# Patient Record
Sex: Male | Born: 1955 | Race: White | Hispanic: No | Smoking: Never smoker
Health system: Southern US, Community
[De-identification: ages and names within clinical notes are randomized; demographics above are authoritative.]

## PROBLEM LIST (undated history)

## (undated) DIAGNOSIS — I219 Acute myocardial infarction, unspecified: Secondary | ICD-10-CM

## (undated) DIAGNOSIS — I350 Nonrheumatic aortic (valve) stenosis: Secondary | ICD-10-CM

## (undated) DIAGNOSIS — M1990 Unspecified osteoarthritis, unspecified site: Secondary | ICD-10-CM

## (undated) DIAGNOSIS — Z87442 Personal history of urinary calculi: Secondary | ICD-10-CM

## (undated) DIAGNOSIS — N200 Calculus of kidney: Secondary | ICD-10-CM

## (undated) DIAGNOSIS — J189 Pneumonia, unspecified organism: Secondary | ICD-10-CM

## (undated) DIAGNOSIS — I10 Essential (primary) hypertension: Secondary | ICD-10-CM

## (undated) DIAGNOSIS — G629 Polyneuropathy, unspecified: Secondary | ICD-10-CM

## (undated) HISTORY — PX: OTHER SURGICAL HISTORY: SHX169

## (undated) HISTORY — PX: REPLACEMENT TOTAL KNEE: SUR1224

## (undated) HISTORY — PX: APPENDECTOMY: SHX54

## (undated) HISTORY — PX: LITHOTRIPSY: SUR834

---

## 1998-03-05 ENCOUNTER — Other Ambulatory Visit: Admission: RE | Admit: 1998-03-05 | Discharge: 1998-03-05 | Payer: Self-pay | Admitting: Family Medicine

## 1998-04-08 ENCOUNTER — Emergency Department (HOSPITAL_COMMUNITY): Admission: EM | Admit: 1998-04-08 | Discharge: 1998-04-08 | Payer: Self-pay

## 1998-06-28 ENCOUNTER — Emergency Department (HOSPITAL_COMMUNITY): Admission: EM | Admit: 1998-06-28 | Discharge: 1998-06-28 | Payer: Self-pay | Admitting: Emergency Medicine

## 1998-06-29 ENCOUNTER — Encounter: Payer: Self-pay | Admitting: Emergency Medicine

## 1998-06-29 ENCOUNTER — Ambulatory Visit (HOSPITAL_COMMUNITY): Admission: RE | Admit: 1998-06-29 | Discharge: 1998-06-29 | Payer: Self-pay | Admitting: Emergency Medicine

## 1998-09-04 ENCOUNTER — Emergency Department (HOSPITAL_COMMUNITY): Admission: EM | Admit: 1998-09-04 | Discharge: 1998-09-04 | Payer: Self-pay | Admitting: Emergency Medicine

## 1998-09-04 ENCOUNTER — Encounter: Payer: Self-pay | Admitting: Emergency Medicine

## 1998-12-02 ENCOUNTER — Emergency Department (HOSPITAL_COMMUNITY): Admission: EM | Admit: 1998-12-02 | Discharge: 1998-12-02 | Payer: Self-pay | Admitting: Emergency Medicine

## 1998-12-03 ENCOUNTER — Encounter: Payer: Self-pay | Admitting: Emergency Medicine

## 1998-12-03 ENCOUNTER — Emergency Department (HOSPITAL_COMMUNITY): Admission: EM | Admit: 1998-12-03 | Discharge: 1998-12-03 | Payer: Self-pay | Admitting: Emergency Medicine

## 1998-12-03 ENCOUNTER — Ambulatory Visit (HOSPITAL_COMMUNITY): Admission: RE | Admit: 1998-12-03 | Discharge: 1998-12-03 | Payer: Self-pay | Admitting: Emergency Medicine

## 1999-03-26 ENCOUNTER — Encounter: Payer: Self-pay | Admitting: Orthopaedic Surgery

## 1999-03-26 ENCOUNTER — Ambulatory Visit (HOSPITAL_COMMUNITY): Admission: RE | Admit: 1999-03-26 | Discharge: 1999-03-26 | Payer: Self-pay | Admitting: Orthopaedic Surgery

## 1999-04-20 ENCOUNTER — Ambulatory Visit (HOSPITAL_COMMUNITY): Admission: RE | Admit: 1999-04-20 | Discharge: 1999-04-20 | Payer: Self-pay | Admitting: Orthopaedic Surgery

## 1999-04-20 ENCOUNTER — Encounter: Payer: Self-pay | Admitting: Orthopaedic Surgery

## 1999-04-23 ENCOUNTER — Emergency Department (HOSPITAL_COMMUNITY): Admission: EM | Admit: 1999-04-23 | Discharge: 1999-04-23 | Payer: Self-pay | Admitting: Emergency Medicine

## 1999-05-11 ENCOUNTER — Encounter: Payer: Self-pay | Admitting: Orthopaedic Surgery

## 1999-05-11 ENCOUNTER — Ambulatory Visit (HOSPITAL_COMMUNITY): Admission: RE | Admit: 1999-05-11 | Discharge: 1999-05-11 | Payer: Self-pay | Admitting: Orthopaedic Surgery

## 2001-12-05 ENCOUNTER — Encounter: Payer: Self-pay | Admitting: Emergency Medicine

## 2001-12-05 ENCOUNTER — Emergency Department (HOSPITAL_COMMUNITY): Admission: EM | Admit: 2001-12-05 | Discharge: 2001-12-05 | Payer: Self-pay | Admitting: Emergency Medicine

## 2002-06-18 ENCOUNTER — Emergency Department (HOSPITAL_COMMUNITY): Admission: EM | Admit: 2002-06-18 | Discharge: 2002-06-19 | Payer: Self-pay | Admitting: Emergency Medicine

## 2002-06-19 ENCOUNTER — Encounter: Payer: Self-pay | Admitting: Emergency Medicine

## 2002-11-10 ENCOUNTER — Encounter: Payer: Self-pay | Admitting: Emergency Medicine

## 2002-11-10 ENCOUNTER — Emergency Department (HOSPITAL_COMMUNITY): Admission: EM | Admit: 2002-11-10 | Discharge: 2002-11-10 | Payer: Self-pay | Admitting: Emergency Medicine

## 2002-11-10 IMAGING — CT CT ABDOMEN W/O CM
1 series · 15 of 32 positions shown, 19 images · non-contrast
Comparison: none

FINDINGS
CLINICAL DATA: 46 YEAR OLD WITH LEFT-SIDED PAIN.  HEMATURIA.  HISTORY OF APPENDECTOMY.
CT ABDOMEN AND PELVIS WITHOUT CONTRAST
HELICAL IMAGES ARE PERFORMED THROUGH THE ABDOMEN AND PELVIS WITHOUT CONTRAST FOLLOWING STONE
PROTOCOL.
CT ABDOMEN:
THERE IS LEFT HYDRONEPHROSIS.  PERINEPHRIC STRANDING AND PERIURETERAL STRANDING IS SEEN ON THE
LEFT.  A 6 X 9 MM CALCULUS IS SEEN AT THE LEFT URETEROPELVIC JUNCTION.  JUST DISTAL TO THE STONE,
THERE IS A SMALL 3 MM STONE (IMAGE #35).  WITHIN THE LEFT RENAL PELVIS A SMALL 4 MM CALCULUS IS
SEEN AND THERE IS PROBABLE AN INTRARENAL CALCULUS IN THE MIDPOLE REGION ON IMAGE #23.  A 1 MM LOWER
POLE STONE IS ALSO SEEN.  NO FOCAL ABNORMALITY IS SEEN WITHIN THE VISUALIZED PORTIONS OF THE LIVER,
SPLEEN, PANCREAS, RIGHT KIDNEY, OR ADRENAL GLANDS.  THE GALLBLADDER IS PRESENT.   THERE IS NO
RETROPERITONEAL ADENOPATHY OR ASCITES.  THE APPENDIX IS ABSENT.
IMPRESSION
LEFT HYDRONEPHROSIS ON THE BASIS OF A 6 X 9 MM URETEROPELVIC JUNCTION STONE.  A 3 MM CALCULUS IS
ALSO SEEN DISTAL TO THIS URETERAL STONE.  THERE ARE INTRARENAL CALCULI ON THE LEFT AS WELL.
CT PELVIS:
THERE IS NO FREE PELVIC FLUID OR PELVIC ADENOPATHY.  THE BOWEL LOOPS HAVE A NORMAL APPEARANCE AS
DOES THE BLADDER.
NO EVIDENCE FOR ACUTE ABNORMALITY OF THE PELVIS.
I DISCUSSED THE FINDINGS WITH DR. KRITZINGER.

[Series 2: kidney sto 5.0 b30f · axial · 0.71mm/px · z∈[-164,+160]mm · 15 of 90 slices shown, 19 images]
[im 6/90  soft-tissue]
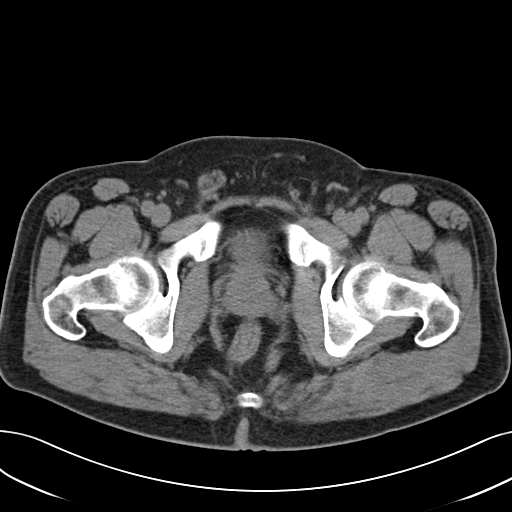
[im 6/90  bone]
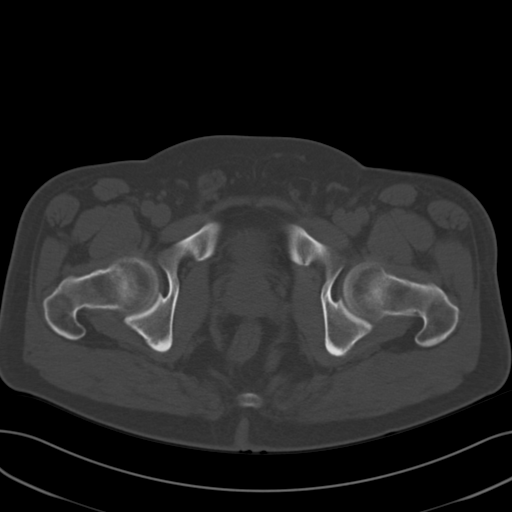
[im 12/90  soft-tissue]
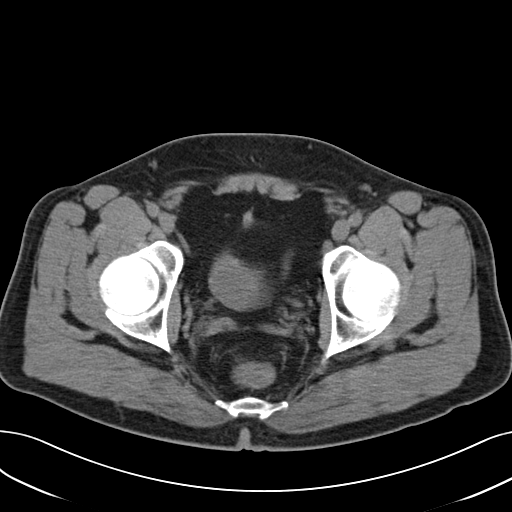
[im 18/90  soft-tissue]
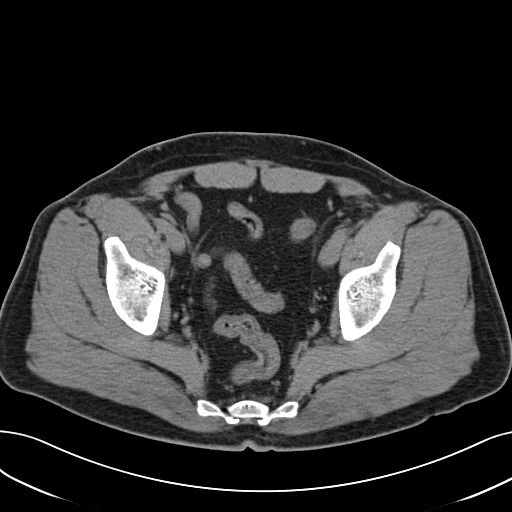
[im 26/90  soft-tissue]
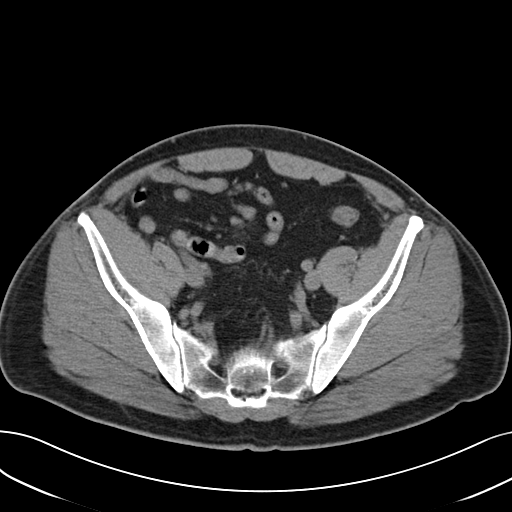
[im 32/90  soft-tissue]
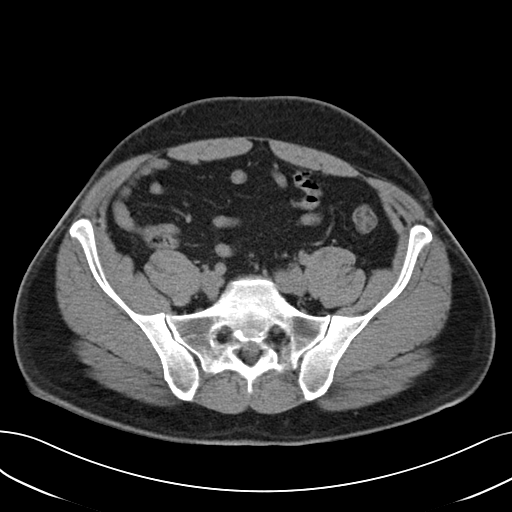
[im 38/90  soft-tissue]
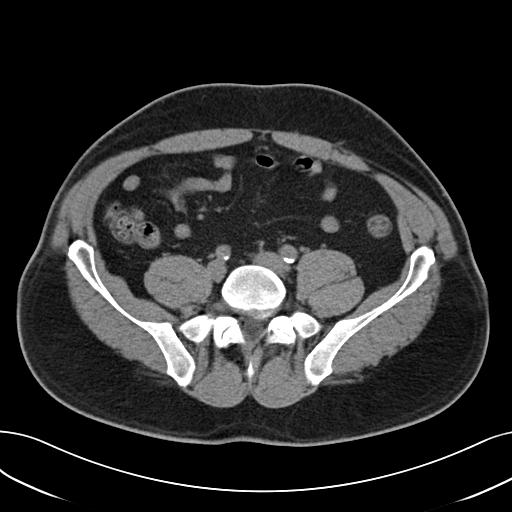
[im 46/90  soft-tissue]
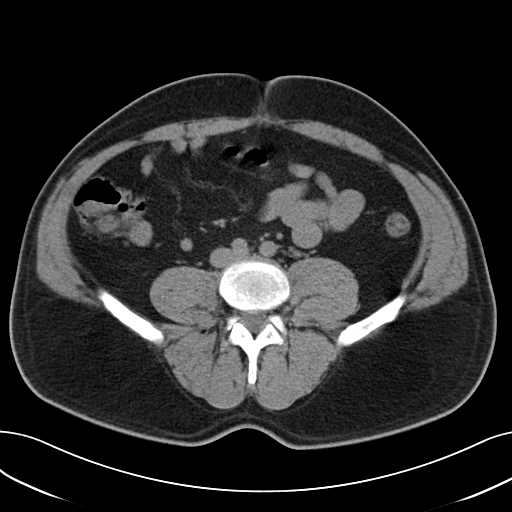
[im 52/90  soft-tissue]
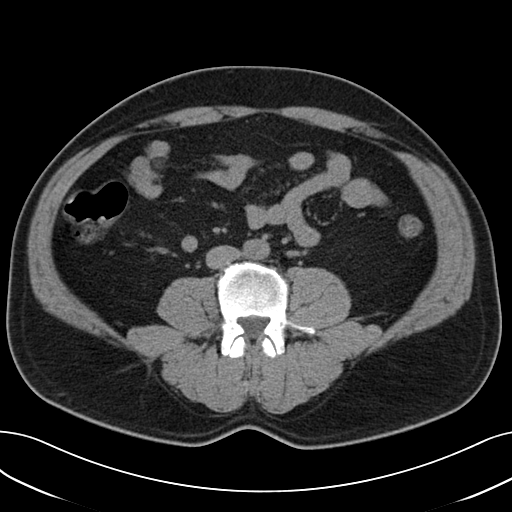
[im 58/90  soft-tissue]
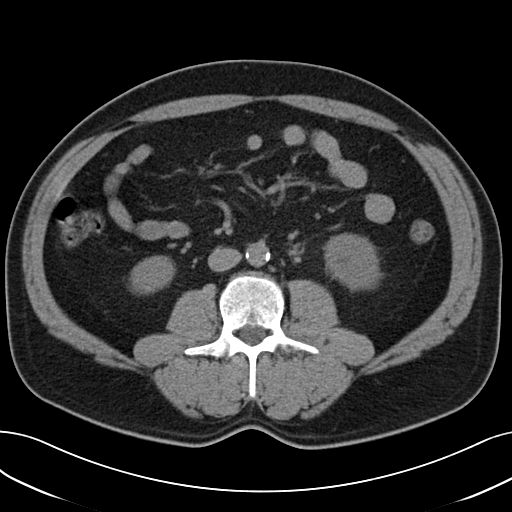
[im 58/90  bone]
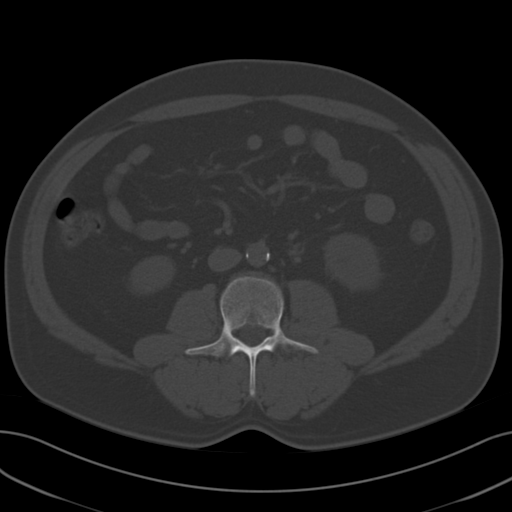
[im 64/90  soft-tissue]
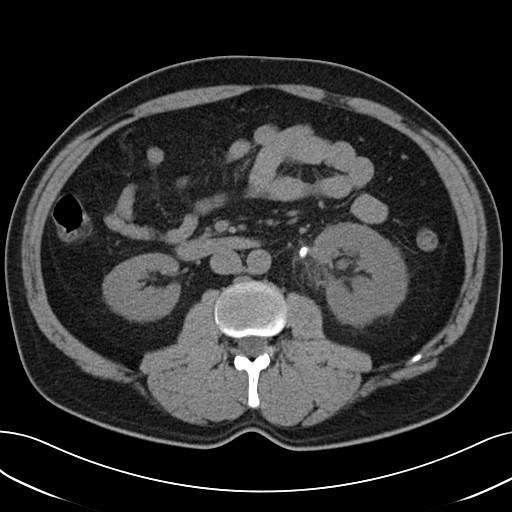
[im 72/90  soft-tissue]
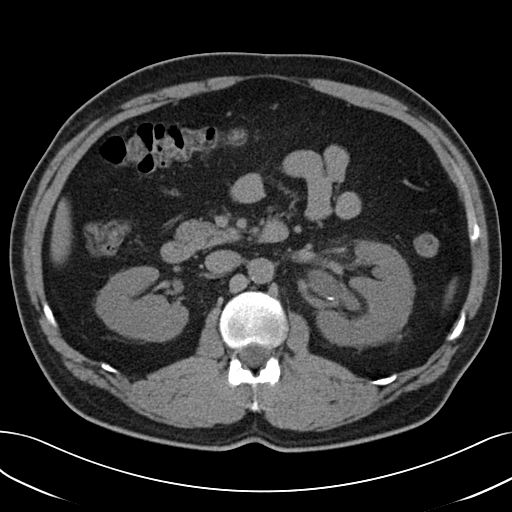
[im 78/90  soft-tissue]
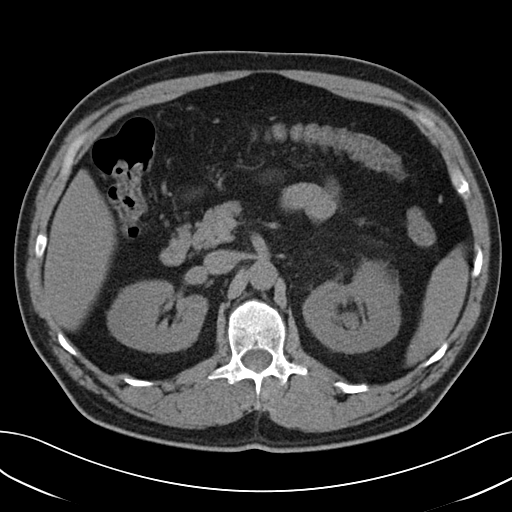
[im 78/90  lung]
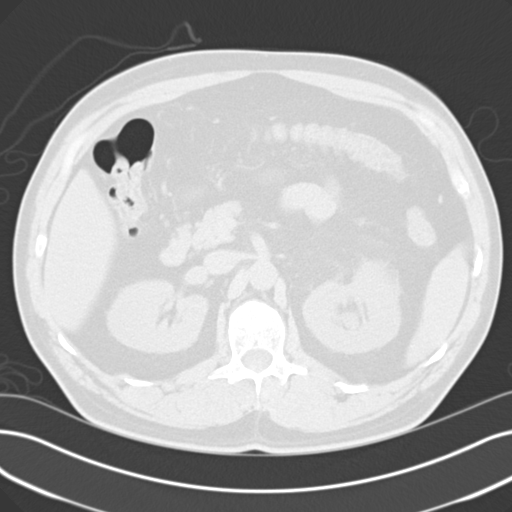
[im 81/90  lung]
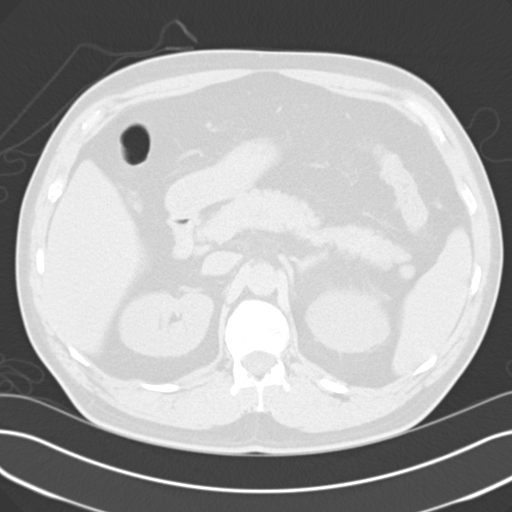
[im 84/90  soft-tissue]
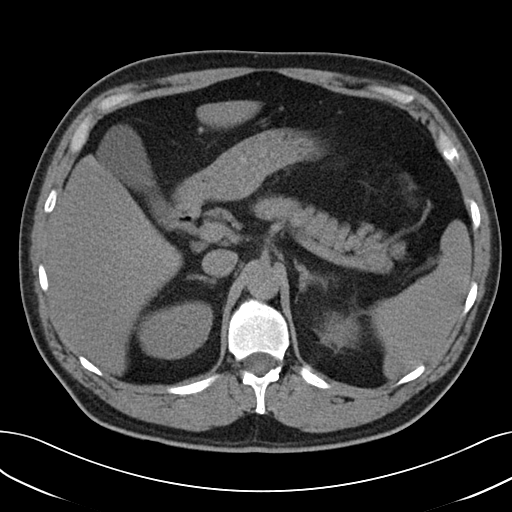
[im 84/90  lung]
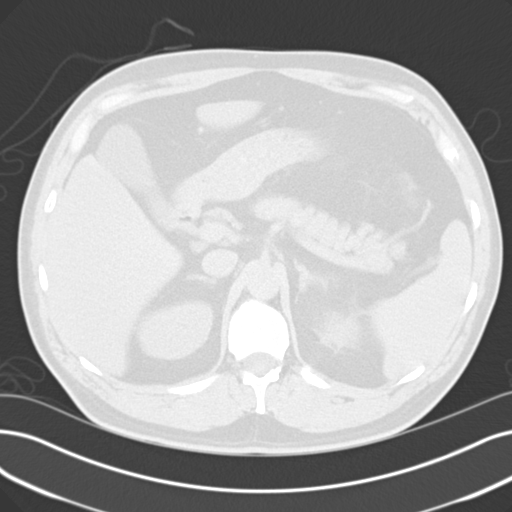
[im 87/90  lung]
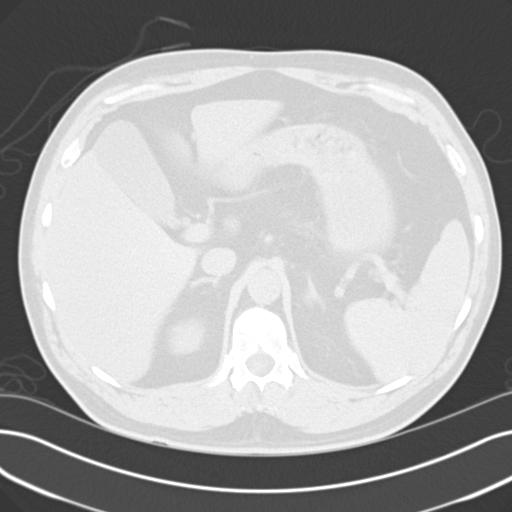

[15 of 32 positions shown; findings below may reference images not displayed]

## 2002-11-11 ENCOUNTER — Ambulatory Visit (HOSPITAL_COMMUNITY): Admission: AD | Admit: 2002-11-11 | Discharge: 2002-11-11 | Payer: Self-pay | Admitting: Urology

## 2002-11-11 ENCOUNTER — Encounter: Payer: Self-pay | Admitting: Urology

## 2002-11-14 ENCOUNTER — Encounter: Payer: Self-pay | Admitting: Urology

## 2002-11-14 ENCOUNTER — Ambulatory Visit (HOSPITAL_BASED_OUTPATIENT_CLINIC_OR_DEPARTMENT_OTHER): Admission: RE | Admit: 2002-11-14 | Discharge: 2002-11-14 | Payer: Self-pay | Admitting: Urology

## 2003-03-06 ENCOUNTER — Inpatient Hospital Stay (HOSPITAL_COMMUNITY): Admission: EM | Admit: 2003-03-06 | Discharge: 2003-03-07 | Payer: Self-pay | Admitting: Urology

## 2003-03-06 ENCOUNTER — Encounter: Payer: Self-pay | Admitting: Urology

## 2003-04-15 ENCOUNTER — Emergency Department (HOSPITAL_COMMUNITY): Admission: EM | Admit: 2003-04-15 | Discharge: 2003-04-15 | Payer: Self-pay | Admitting: Emergency Medicine

## 2004-02-03 ENCOUNTER — Emergency Department (HOSPITAL_COMMUNITY): Admission: EM | Admit: 2004-02-03 | Discharge: 2004-02-03 | Payer: Self-pay | Admitting: Emergency Medicine

## 2004-02-03 IMAGING — CT CT PELVIS W/O CM
1 series · 15 of 32 positions shown, 19 images · non-contrast
Comparison: [DATE].

CLINICAL DATA: Left lower abdominal pain.  History of nephrolithiasis.  Gross hematuria. 
 CT ABDOMEN WITHOUT CONTRAST

[Series 3: — · axial · 0.72mm/px · z∈[+818,+1182]mm · 15 of 102 slices shown, 19 images]
[im 7/102  soft-tissue]
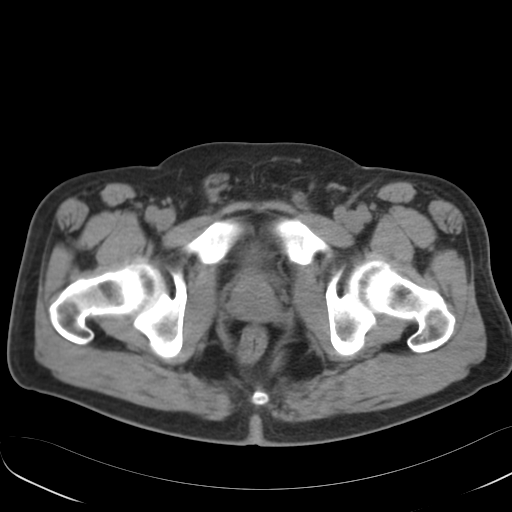
[im 7/102  bone]
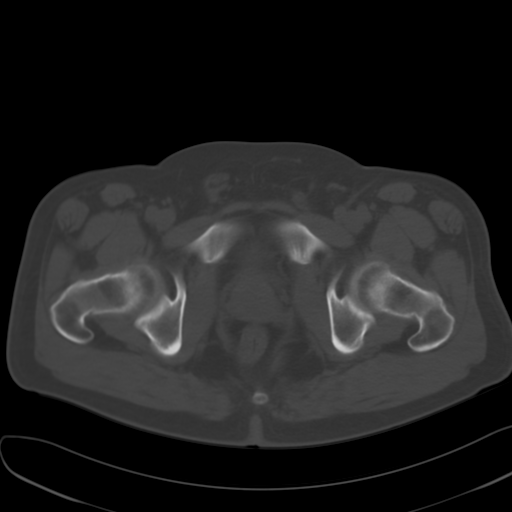
[im 14/102  soft-tissue]
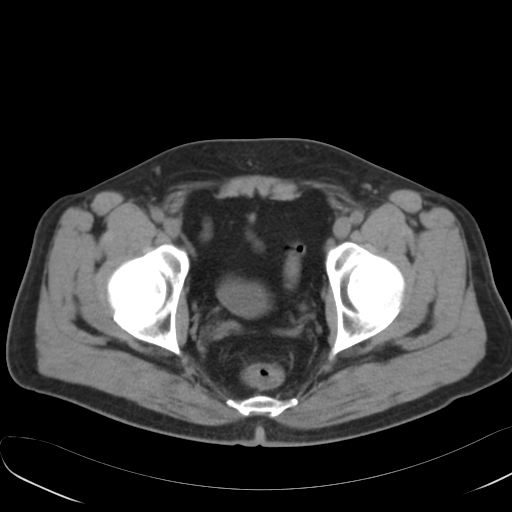
[im 20/102  soft-tissue]
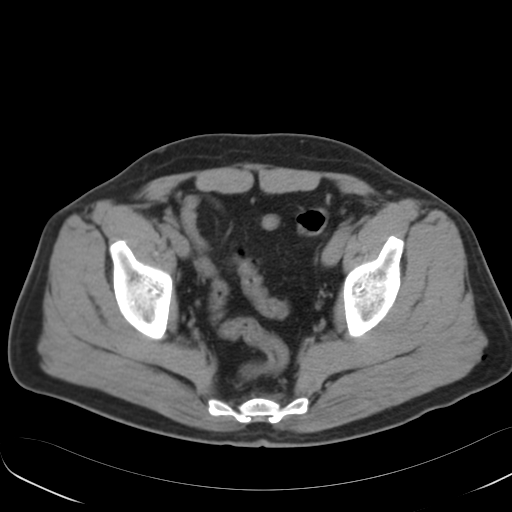
[im 30/102  soft-tissue]
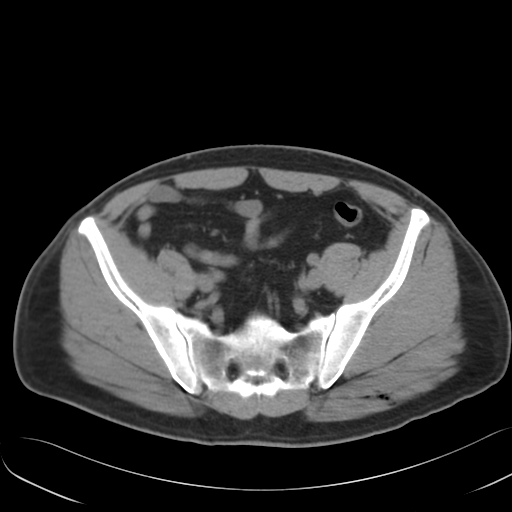
[im 36/102  soft-tissue]
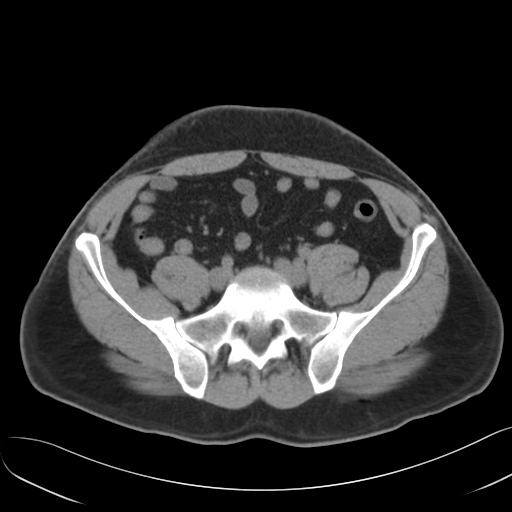
[im 43/102  soft-tissue]
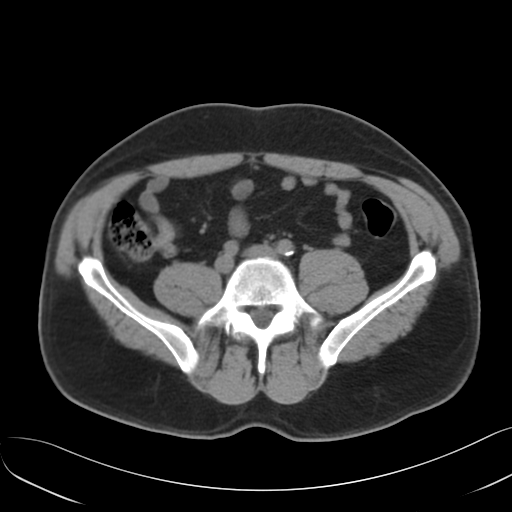
[im 53/102  soft-tissue]
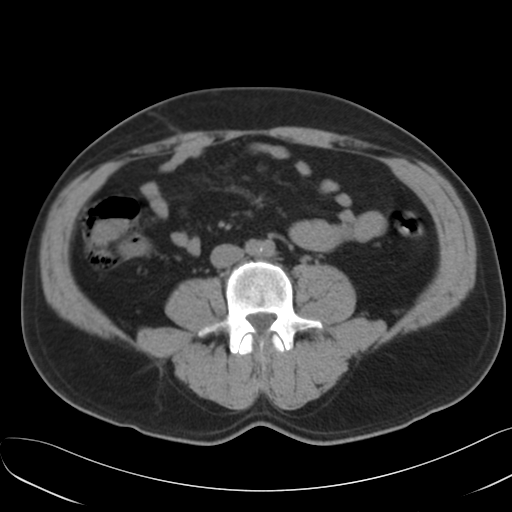
[im 59/102  soft-tissue]
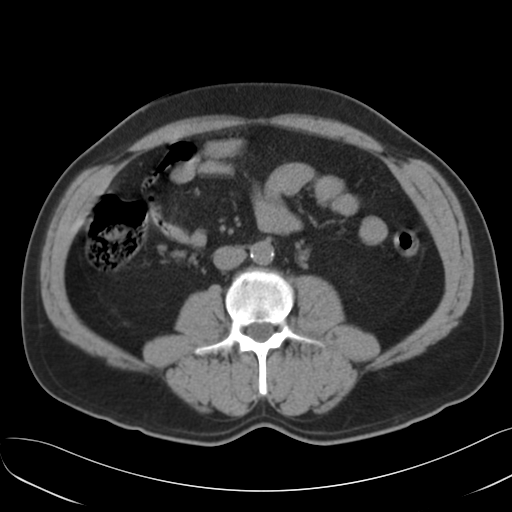
[im 66/102  soft-tissue]
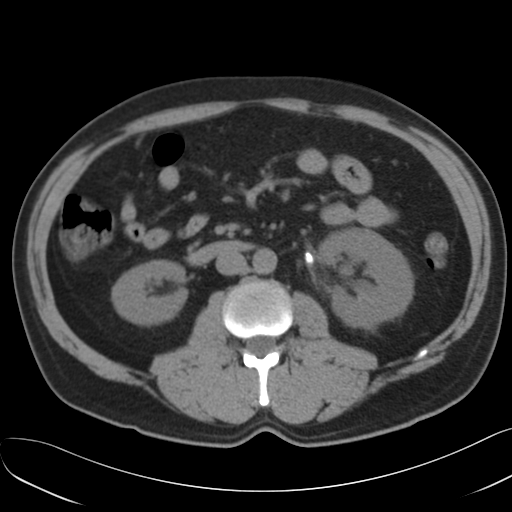
[im 66/102  bone]
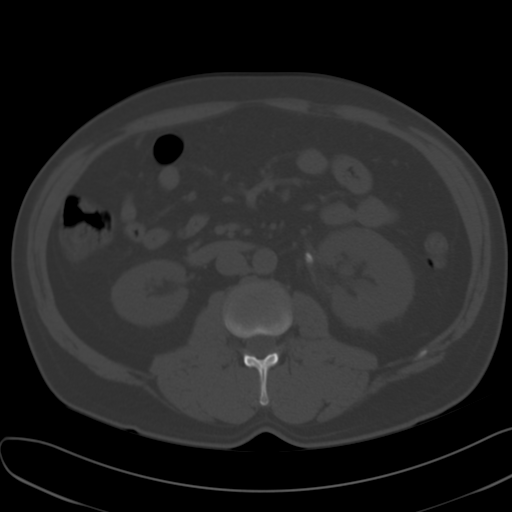
[im 72/102  soft-tissue]
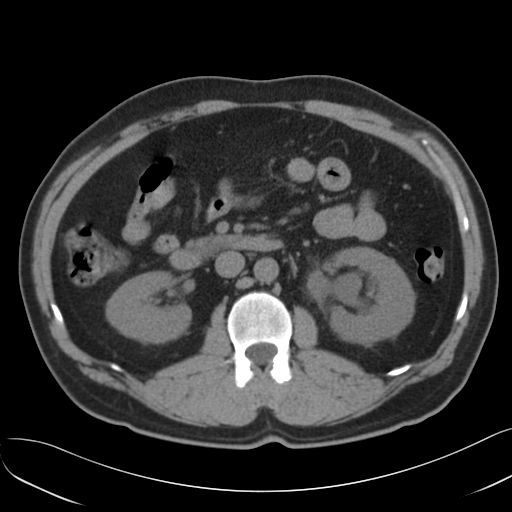
[im 82/102  soft-tissue]
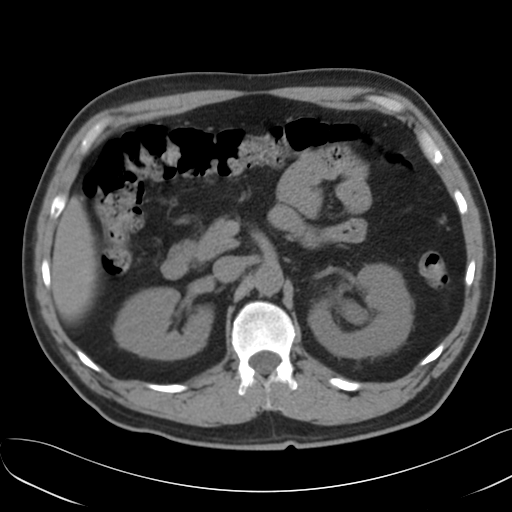
[im 88/102  soft-tissue]
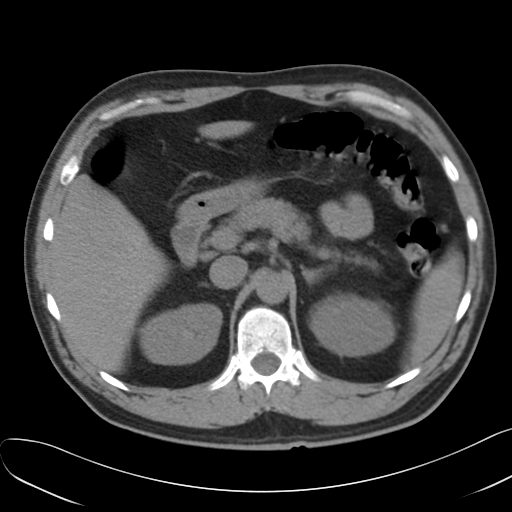
[im 88/102  lung]
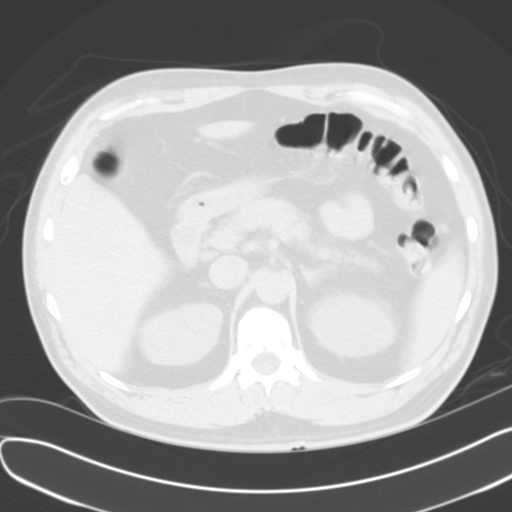
[im 92/102  lung]
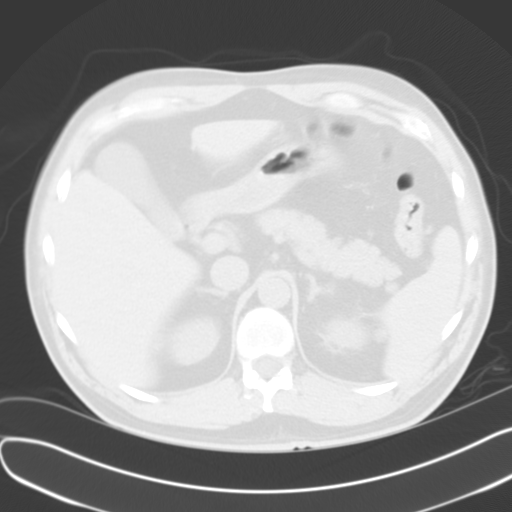
[im 95/102  soft-tissue]
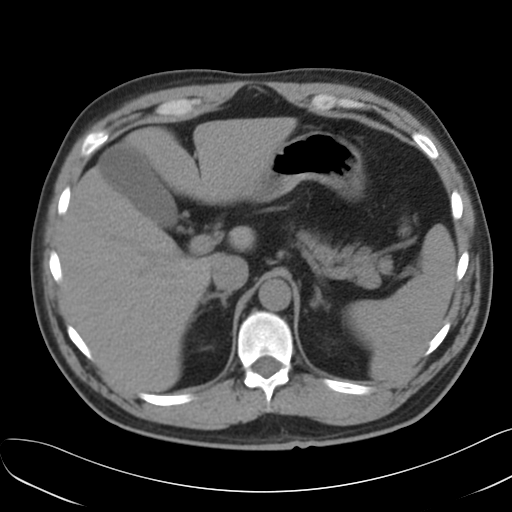
[im 95/102  lung]
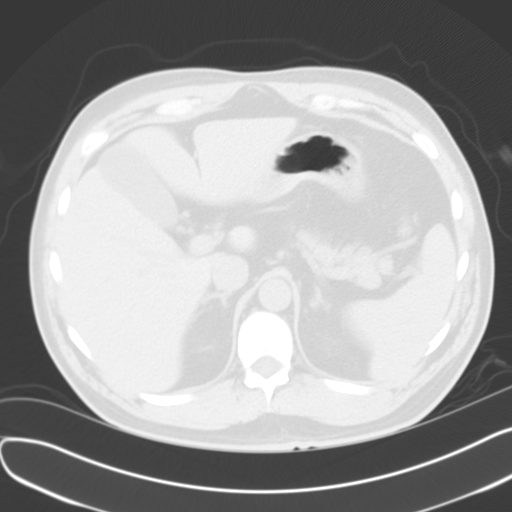
[im 98/102  lung]
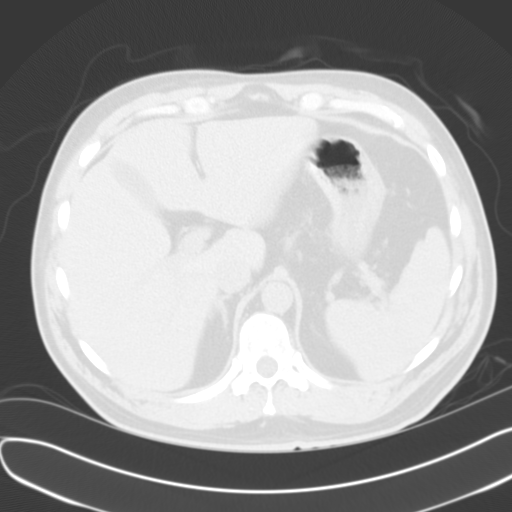

[15 of 32 positions shown; findings below may reference images not displayed]

FINDINGS: 
There is left hydronephrosis with an 8 mm calculus at the ureteropelvic junction. There is some edema around the left kidney and left renal collecting system. 1 to 2 mm calculus noted in the lower pole of the left renal collecting system and 1 to 2 mm calculi in the interpolar region of the left renal collecting system.  The right kidney is unremarkable as are visualized portions of the liver and spleen, gallbladder, adrenal glands, pancreas.  Minimal atheromatous changes in the abdominal aorta without aneurysm.  Small bowel decompressed.  No free air.  No ascites.  No adenopathy.  There is an exophytic 12 mm lesion from the upper pole left kidney which may represent a simple cyst but is incompletely evaluated. 
 IMPRESSION
 8 mm left UPJ calculus with hydronephrosis.
 CT PELVIS WITHOUT CONTRAST
FINDINGS: Spiral CT without oral or IV contrast.  Urinary bladder decompressed.  Distal ureters are decompressed.  The colon is decompressed, unremarkable.  The appendix is not discretely identified.  No free fluid.  
 IMPRESSION
 Unremarkable CT pelvis.

## 2006-07-09 ENCOUNTER — Emergency Department (HOSPITAL_COMMUNITY): Admission: EM | Admit: 2006-07-09 | Discharge: 2006-07-09 | Payer: Self-pay | Admitting: Emergency Medicine

## 2006-07-09 IMAGING — CT CT ABDOMEN W/O CM
2 of 4 series · 17 of 46 positions shown, 19 images · IV contrast (agent unspecified)
Comparison: none

CLINICAL DATA: 50-year-old with left flank pain.
ABDOMEN CT WITHOUT CONTRAST:
TECHNIQUE: Multidetector CT imaging of the abdomen was performed following the standard protocol without IV contrast.
TECHNIQUE: Multidetector CT imaging of the pelvis was performed following the standard protocol without IV contrast.

[Series 2: stone_wo 5.0 b40f st · axial · 0.69mm/px · z∈[-527,-191]mm · 14 of 95 slices shown, 16 images]
[im 7/95  soft-tissue]
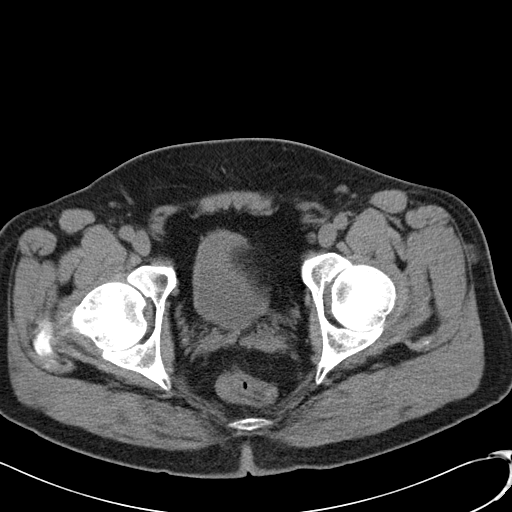
[im 7/95  bone]
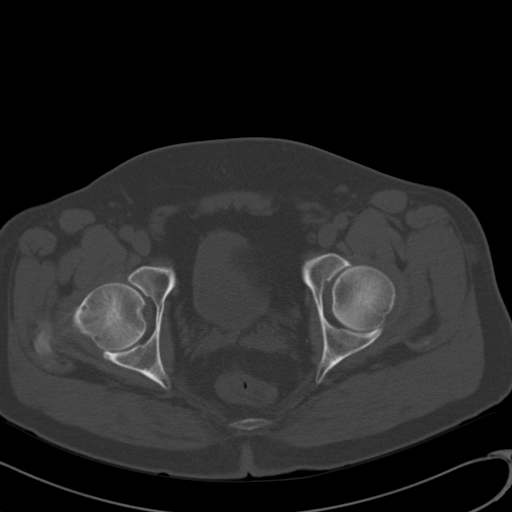
[im 13/95  soft-tissue]
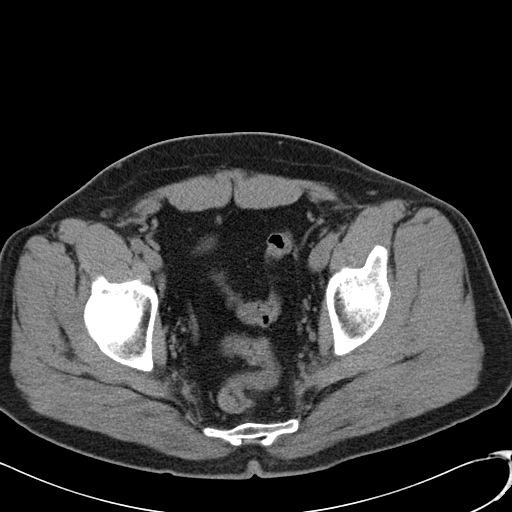
[im 20/95  soft-tissue]
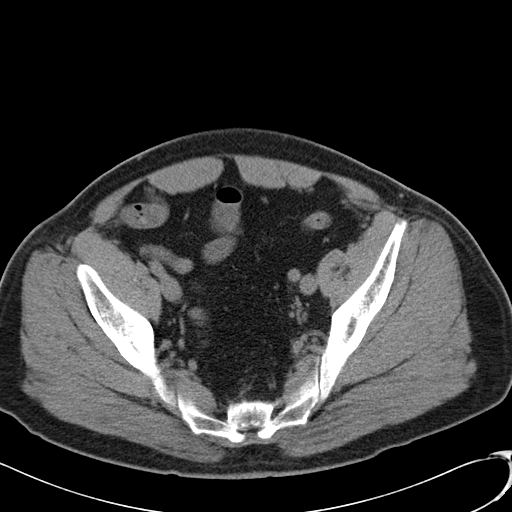
[im 26/95  soft-tissue]
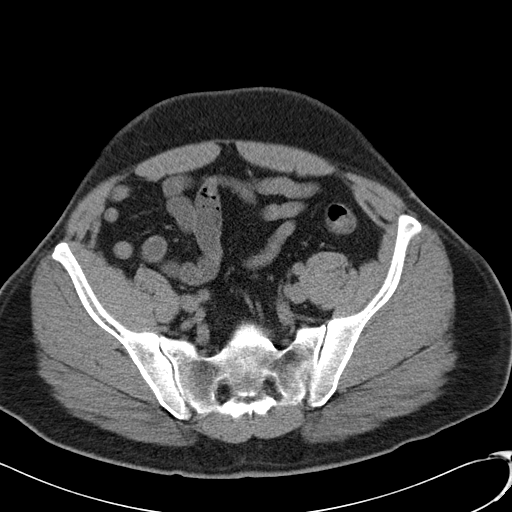
[im 33/95  soft-tissue]
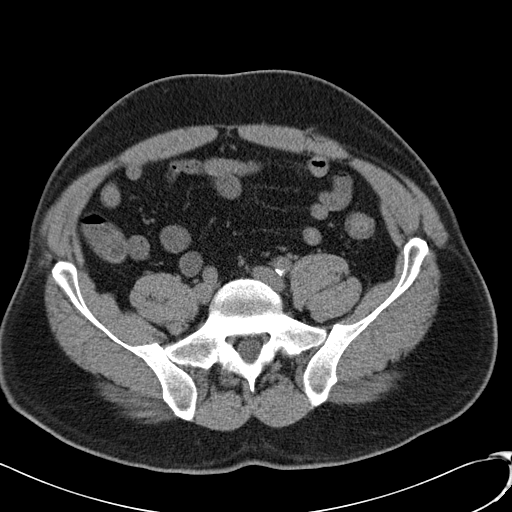
[im 39/95  soft-tissue]
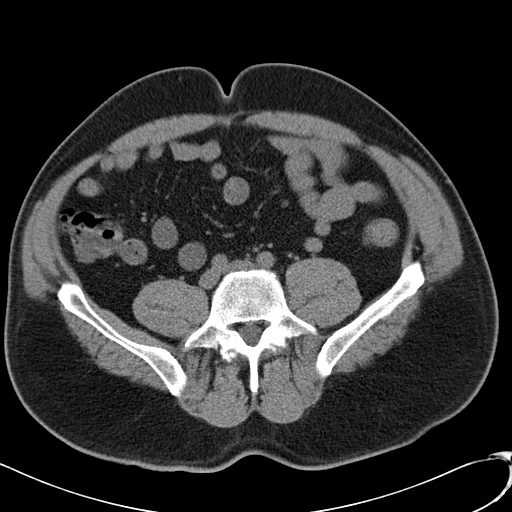
[im 46/95  soft-tissue]
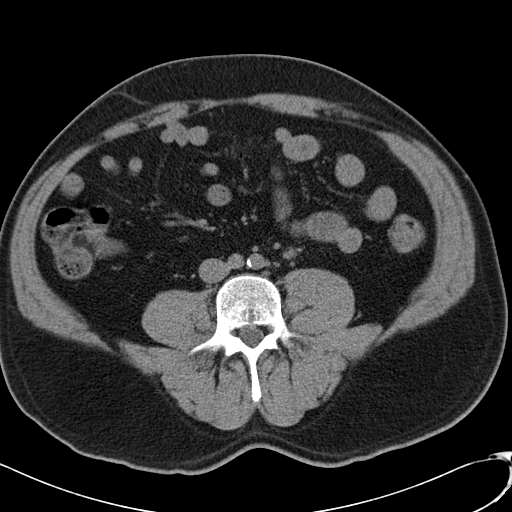
[im 52/95  soft-tissue]
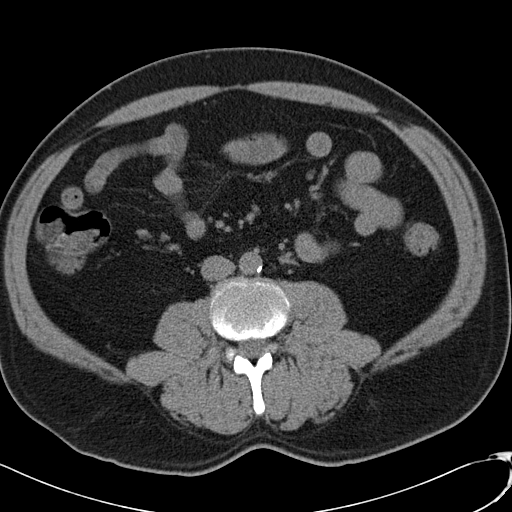
[im 59/95  soft-tissue]
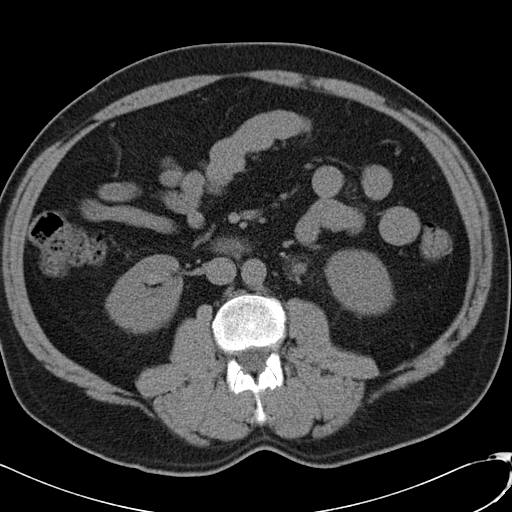
[im 59/95  bone]
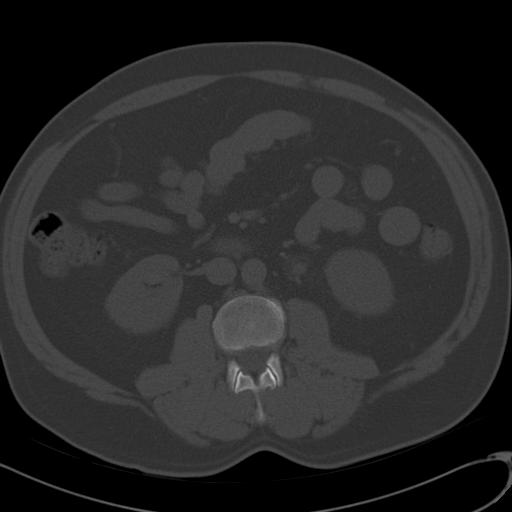
[im 65/95  soft-tissue]
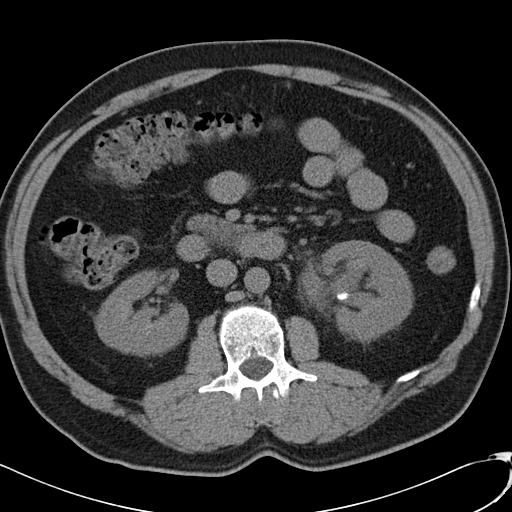
[im 72/95  soft-tissue]
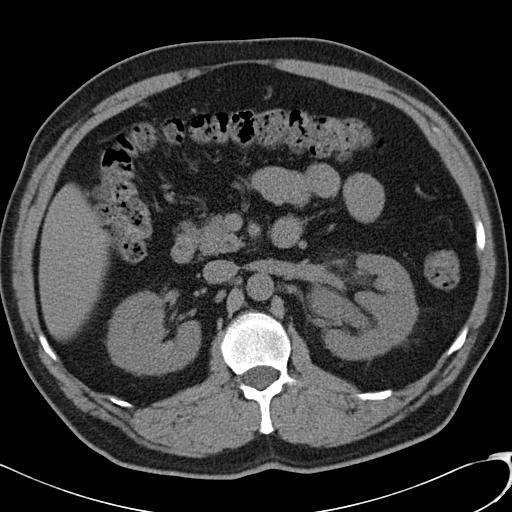
[im 78/95  soft-tissue]
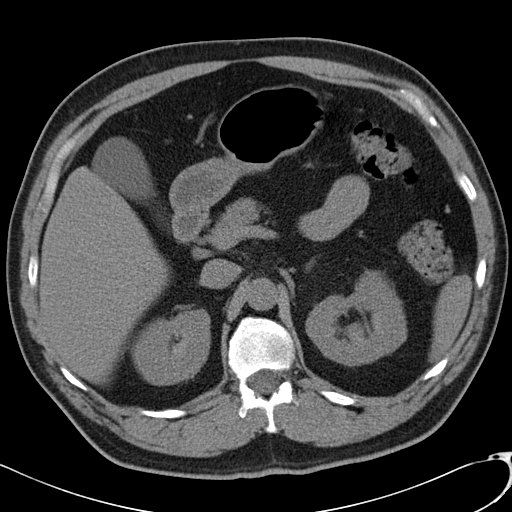
[im 85/95  soft-tissue]
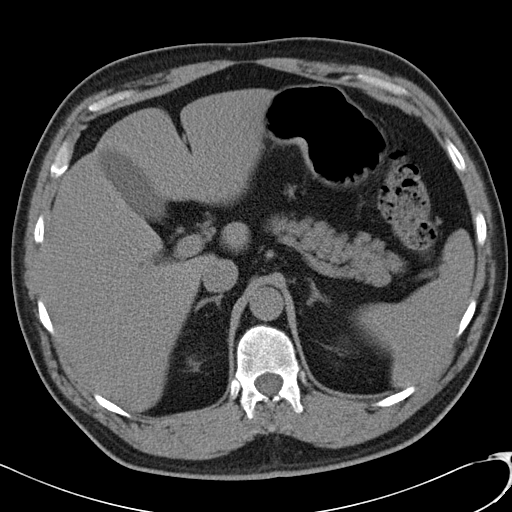
[im 91/95  soft-tissue]
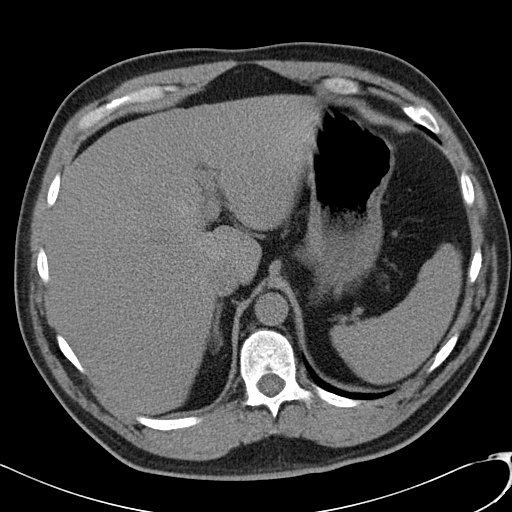

[Series 602: <mpr range> · coronal · 0.77mm/px · 3 of 122 slices shown]
[im 41/122  soft-tissue]
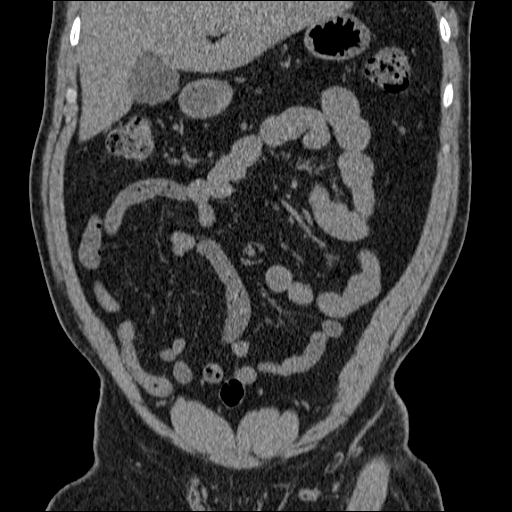
[im 54/122  soft-tissue]
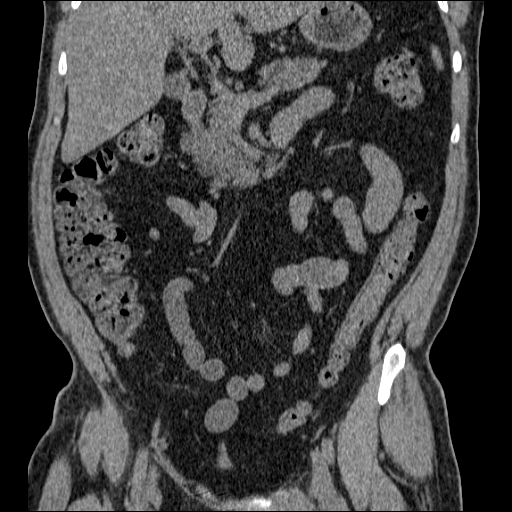
[im 68/122  soft-tissue]
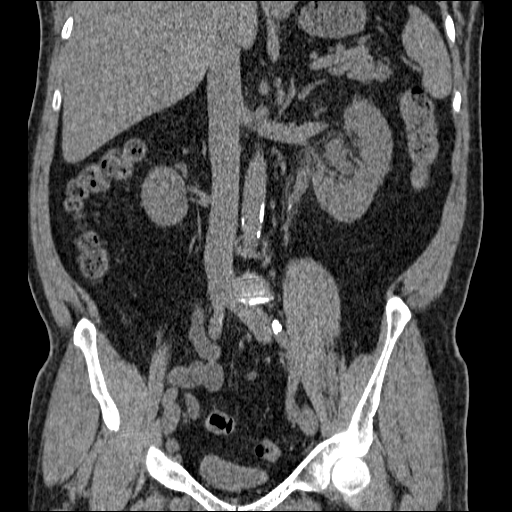

[17 of 46 positions shown; findings below may reference images not displayed]

FINDINGS: Visualized lung bases are clear.  The superior aspect of the stomach, liver and spleen are not completely visualized.  Stable exophytic round lesion that originates from the upper pole of the left kidney measuring 12 mm. This is unchanged from [DATE] and probably represents a hyperdense cyst.  Left hydronephrosis with a 9 mm stone in the left renal pelvis along with another punctate stone in the lower calix.  Hydronephrosis appears to be secondary to an obstructing lesion in the proximal ureter measuring 4 mm.  No other ureteral stones are present.  Stable and normal appearance of the right kidney without hydronephrosis.  Subtle low density area in the lower pole of the left kidney that is probably stable and suggestive for a cyst.  Evaluation of the intraabdominal organs is inherently limited without intravenous contrast.  No free fluid or lymphadenopathy.  No acute bone abnormalities.
IMPRESSION: 1.  Left hydroureteronephrosis secondary to a 4 mm stone in the proximal left ureter.  Additional stones in the left renal pelvis.
2.  Stable exophytic high density structure involving upper pole of the left kidney probably represents hyperdense cyst.  
PELVIS CT WITHOUT CONTRAST:
FINDINGS: The urinary bladder, prostate, and seminal vesicles are within normal limits.  No significant free fluid or lymphadenopathy.  Stable small lymph nodes in the pelvis.  No acute bone abnormalities.
IMPRESSION: Negative CT pelvis.

## 2006-07-10 ENCOUNTER — Inpatient Hospital Stay (HOSPITAL_COMMUNITY): Admission: EM | Admit: 2006-07-10 | Discharge: 2006-07-11 | Payer: Self-pay | Admitting: Emergency Medicine

## 2006-07-10 IMAGING — CR DG ABDOMEN 1V
2 series · 2 of 2 positions shown · non-contrast
Comparison: CT urogram yesterday.

CLINICAL DATA: Left urinary tract calculi.

ABDOMEN -  AP VIEW  [DATE]:

[view not recorded (1 of 2)]
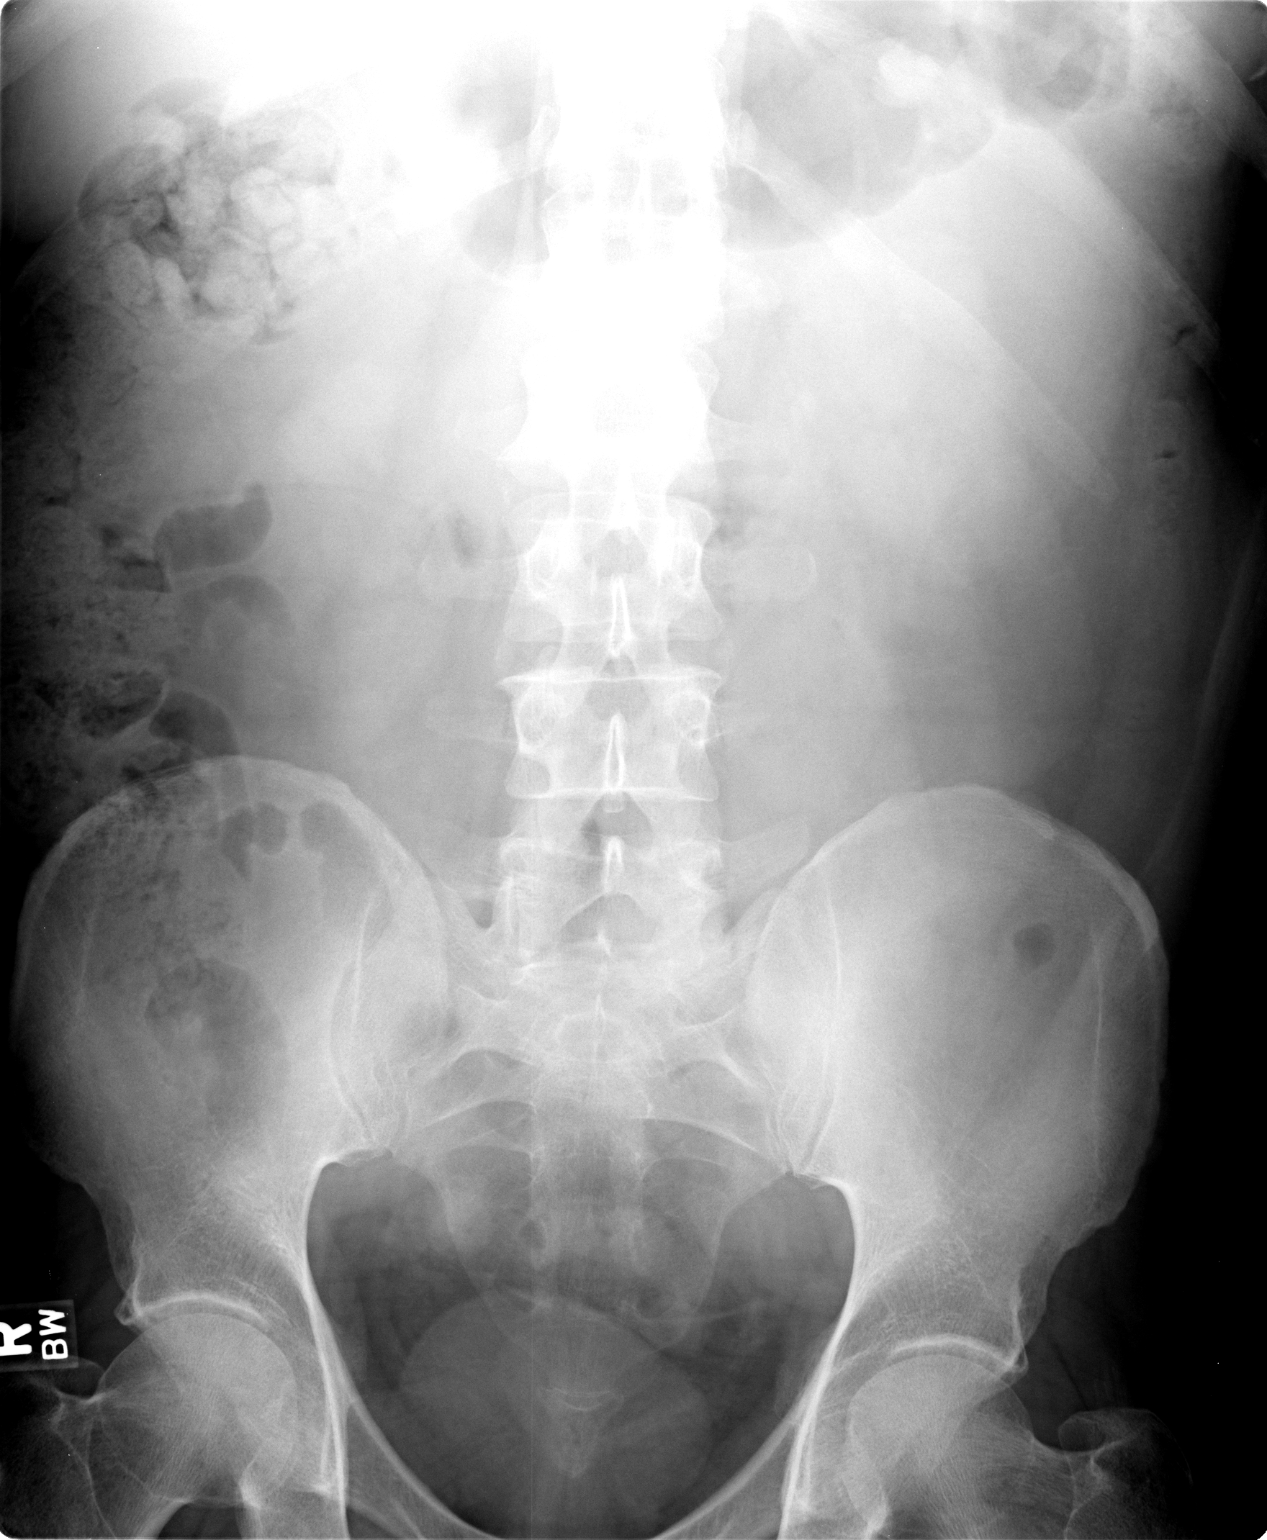

[view not recorded (2 of 2)]
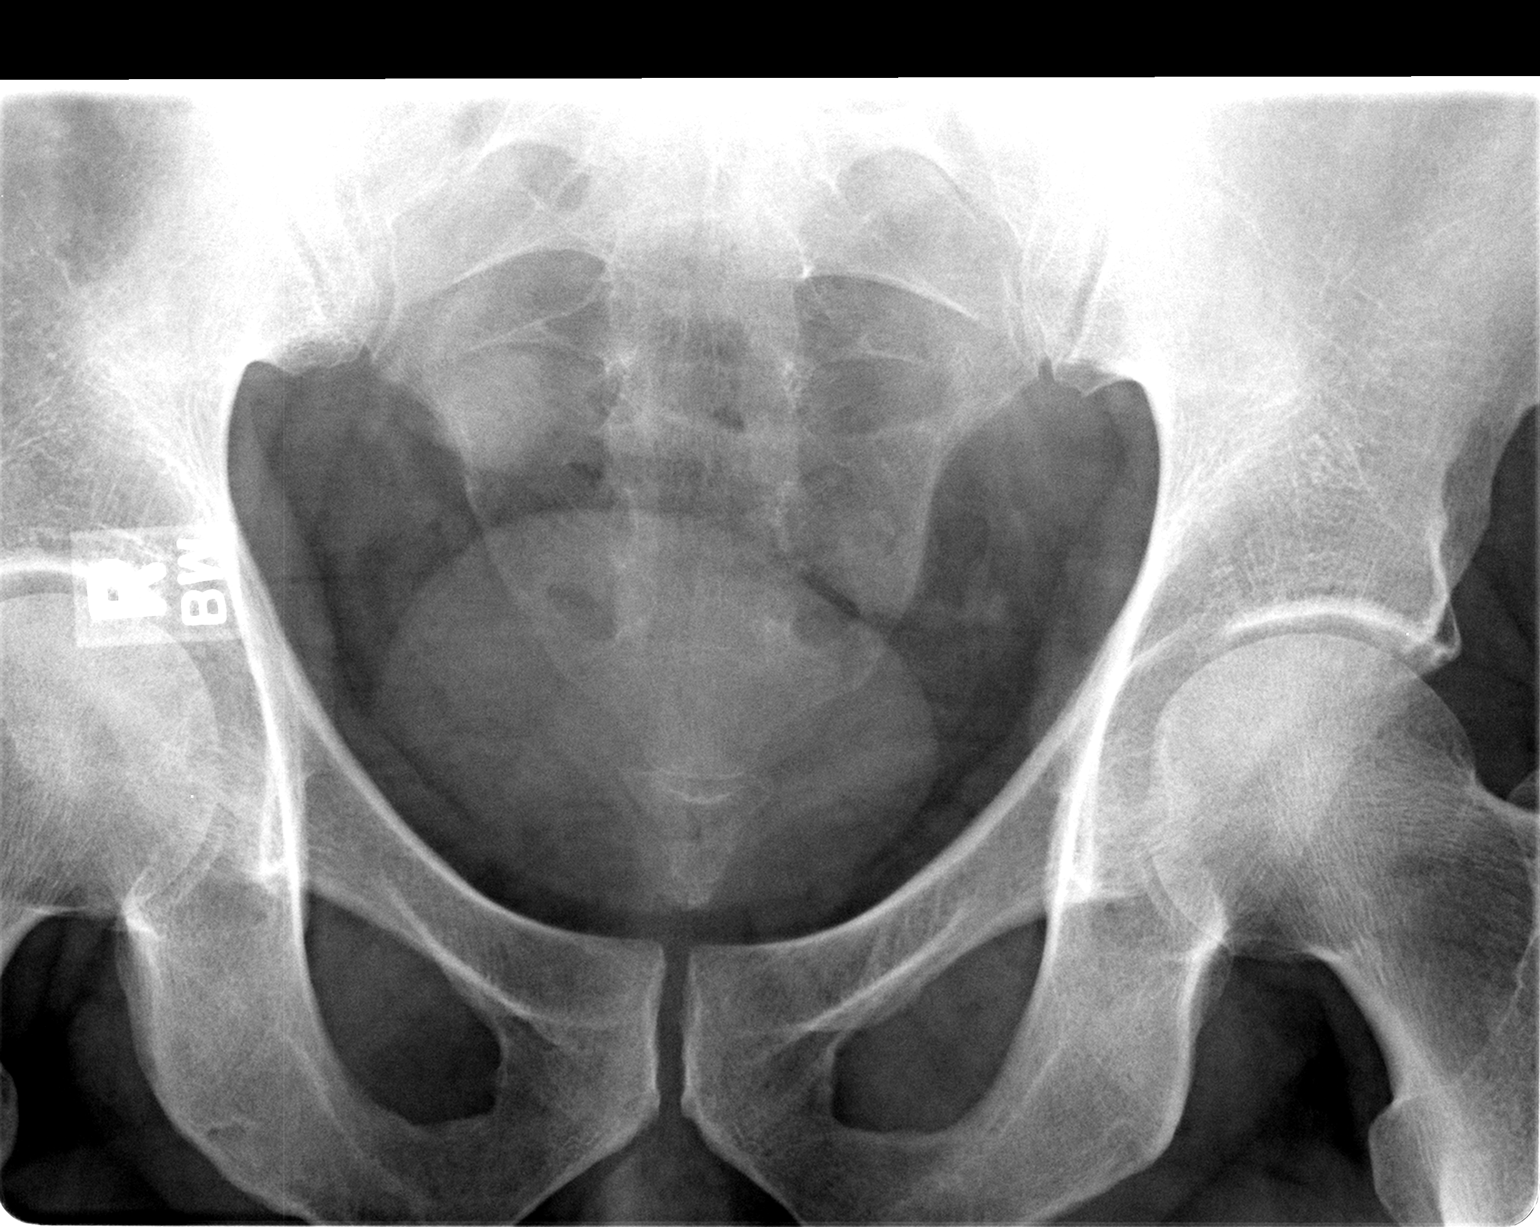

[2 of 2 positions shown; findings below may reference images not displayed]

FINDINGS: The 4 mm proximal left ureteral calculus identified on the CT urogram
is not well seen on the x-ray. Similarly, the calculus in the left renal pelvis
is not well seen. The bowel gas pattern is unremarkable. The psoas margins are
visible.
IMPRESSION: The proximal left ureteral calculus and the left renal pelvis calculus
identified on the CT urogram yesterday are not well seen on the x-ray.

## 2006-12-03 ENCOUNTER — Emergency Department (HOSPITAL_COMMUNITY): Admission: EM | Admit: 2006-12-03 | Discharge: 2006-12-03 | Payer: Self-pay | Admitting: Emergency Medicine

## 2006-12-03 IMAGING — CT CT ABDOMEN W/O CM
2 of 4 series · 17 of 46 positions shown, 19 images · IV contrast (agent unspecified)
Comparison: [DATE].

CLINICAL DATA: 50-year-old male, acute left flank pain.  History of renal calculi. 
ABDOMEN CT WITHOUT CONTRAST:
TECHNIQUE: Multidetector CT imaging of the abdomen was performed following the standard protocol without IV contrast.
TECHNIQUE: Multidetector CT imaging of the pelvis was performed following the standard protocol without IV contrast.

[Series 2: stone_wo 5.0 b40f st · axial · 0.75mm/px · z∈[-505,-45]mm · 14 of 127 slices shown, 16 images]
[im 6/127  soft-tissue]
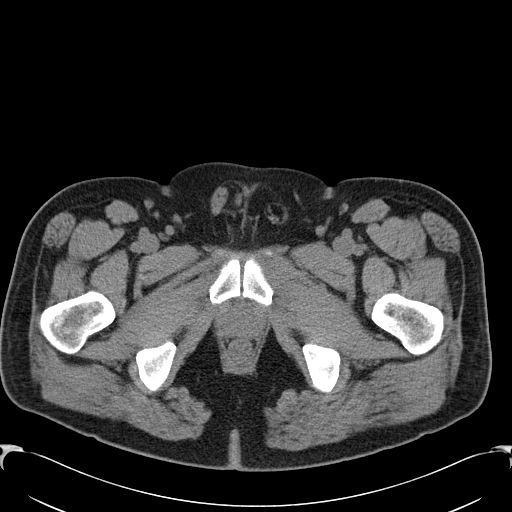
[im 6/127  bone]
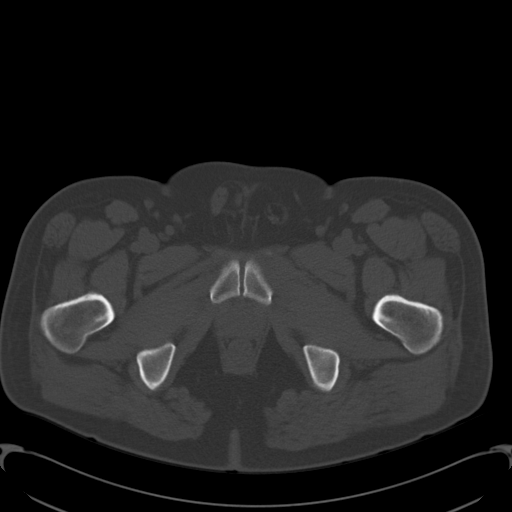
[im 16/127  soft-tissue]
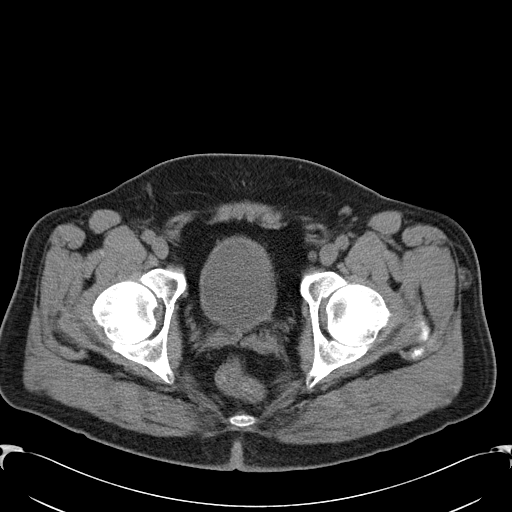
[im 27/127  soft-tissue]
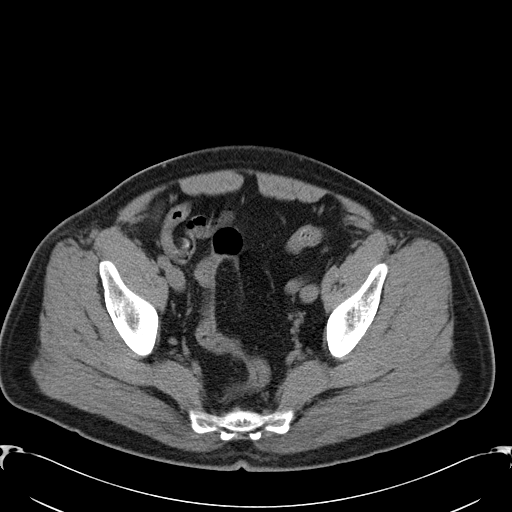
[im 32/127  soft-tissue]
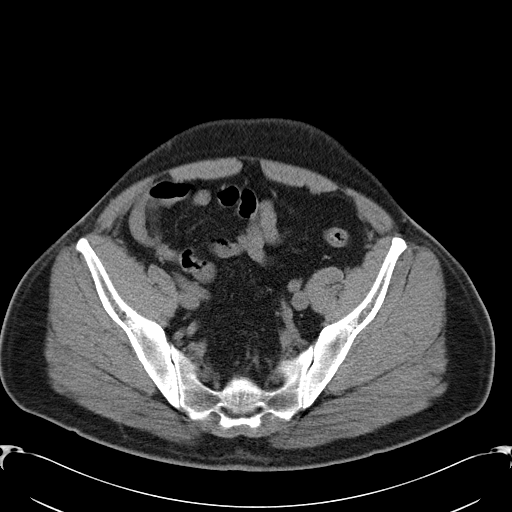
[im 43/127  soft-tissue]
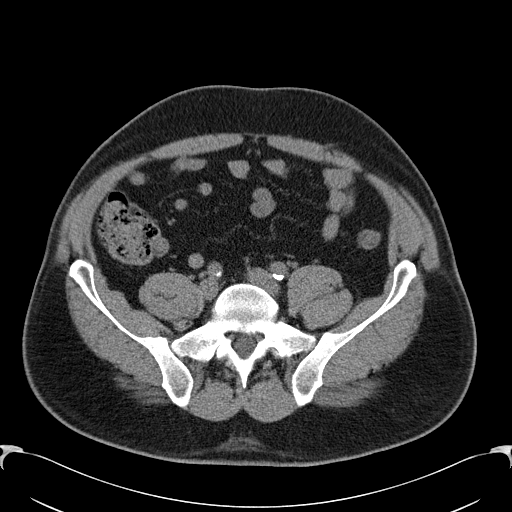
[im 53/127  soft-tissue]
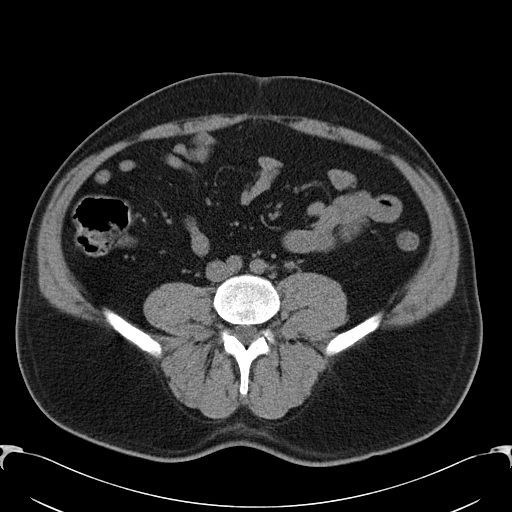
[im 58/127  soft-tissue]
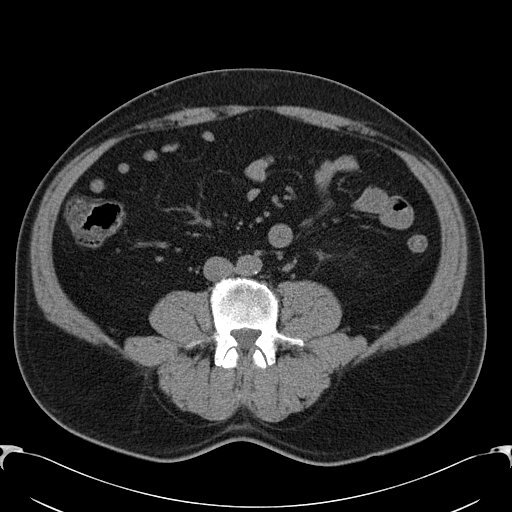
[im 69/127  soft-tissue]
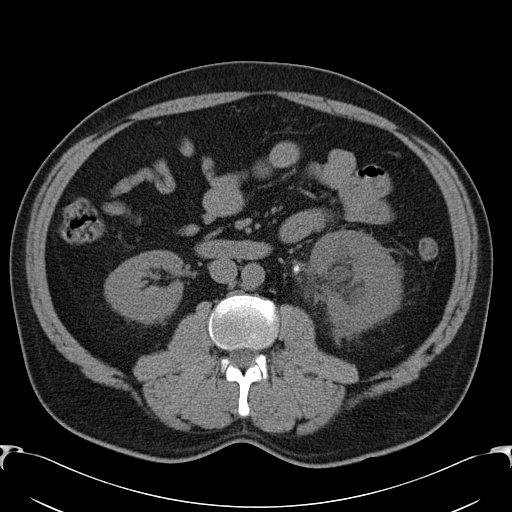
[im 74/127  soft-tissue]
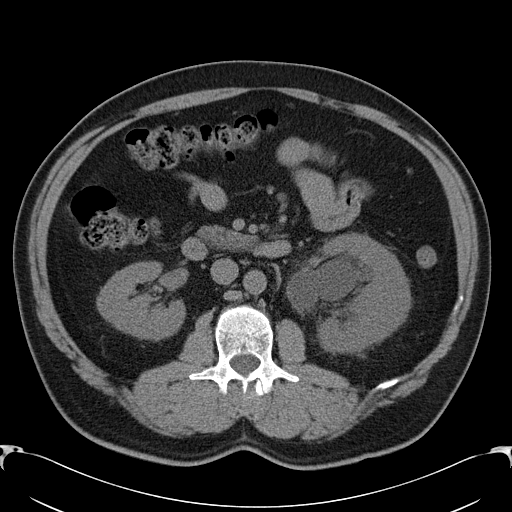
[im 74/127  bone]
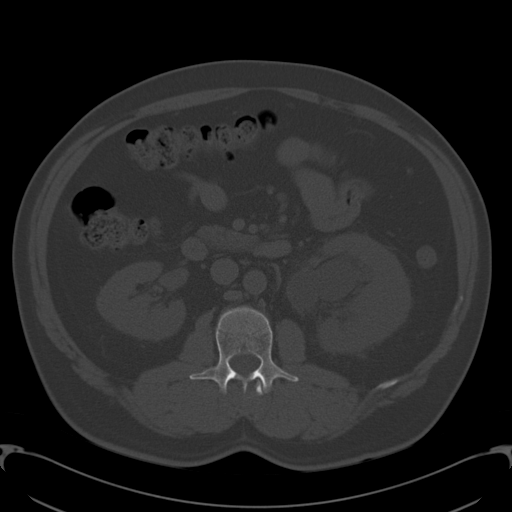
[im 85/127  soft-tissue]
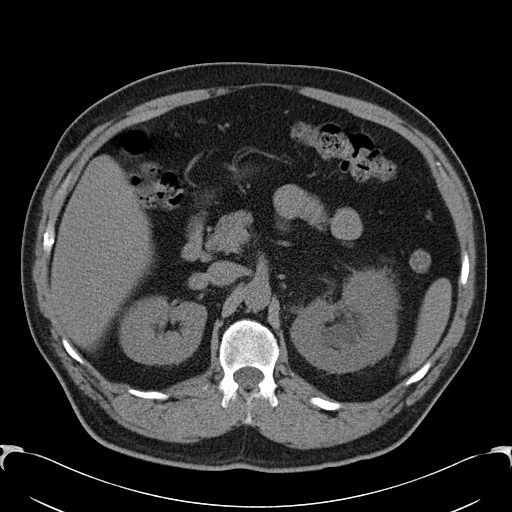
[im 95/127  soft-tissue]
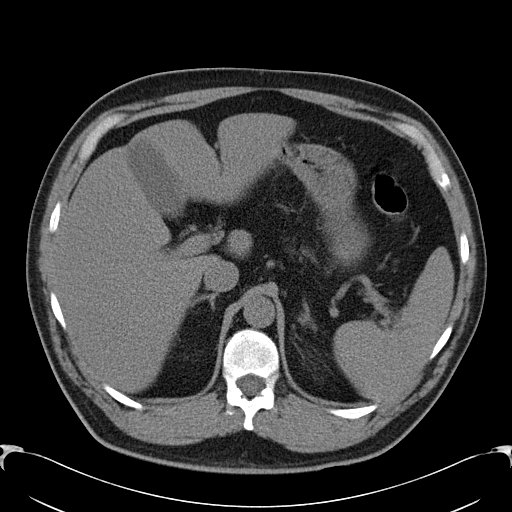
[im 100/127  soft-tissue]
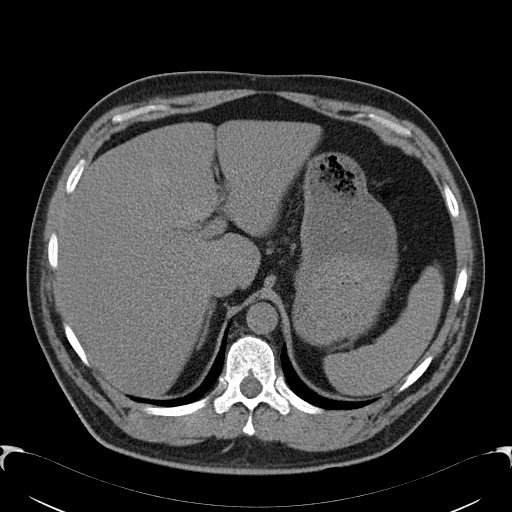
[im 111/127  soft-tissue]
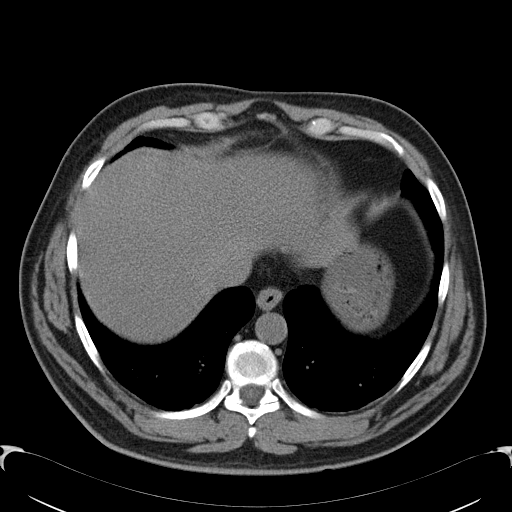
[im 121/127  soft-tissue]
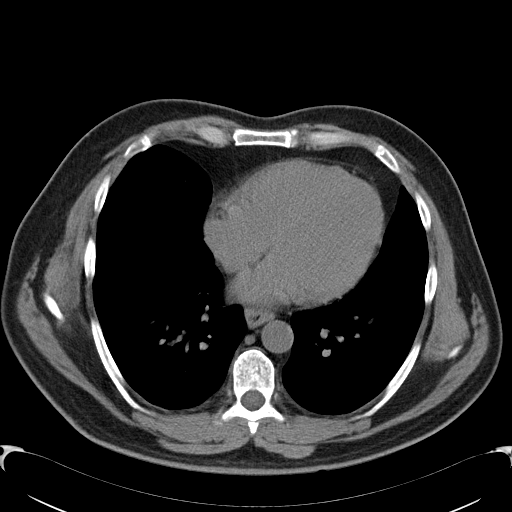

[Series 602: coronal abdomen · coronal · 1.03mm/px · 3 of 145 slices shown]
[im 49/145  soft-tissue]
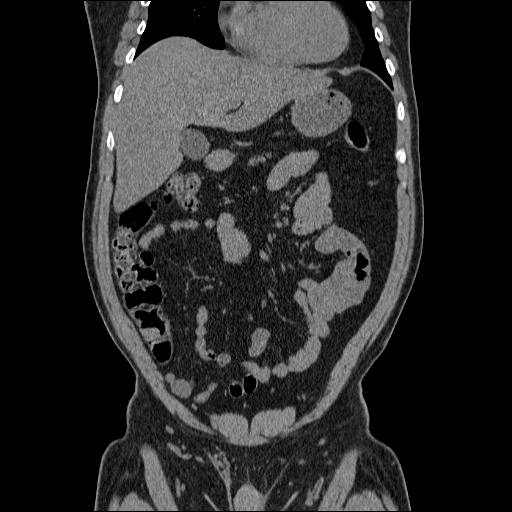
[im 65/145  soft-tissue]
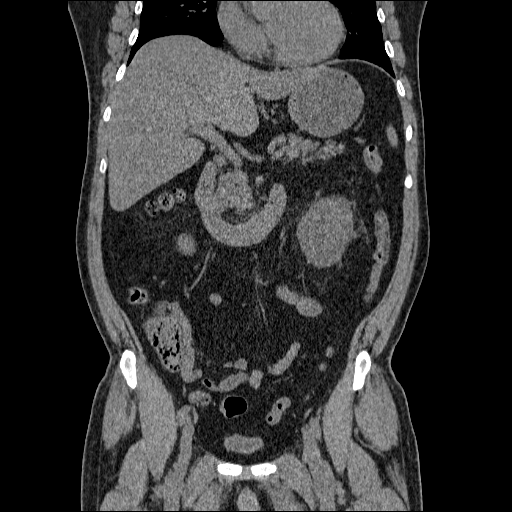
[im 81/145  soft-tissue]
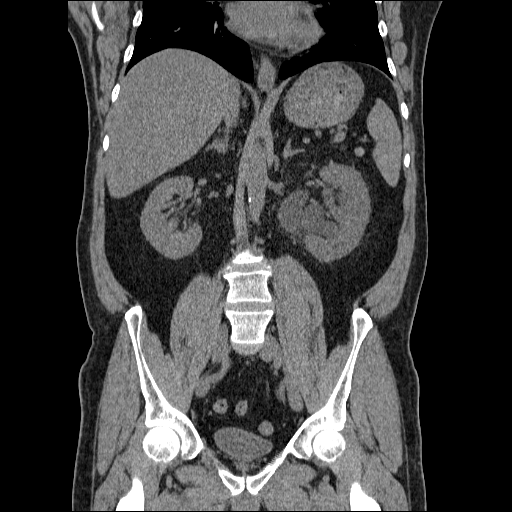

[17 of 46 positions shown; findings below may reference images not displayed]

FINDINGS: Minimal bibasilar atelectasis.  
In the abdomen, the left kidney demonstrates acute perinephric inflammation and moderately severe obstructive hydronephrosis.  This is secondary to a proximal left UPJ obstructing calculus measuring 11 mm in diameter, image 57.  There is an additional layering nonobstructing calculus in the dilated renal pelvis dependently on image 51 roughly measuring 7 mm x 2 mm.  The right kidney demonstrates no evidence of calculi or obstruction.  
Within the limits of noncontrast imaging, no additional acute finding in the abdomen. 
The left kidney also demonstrates a stable upper pole 11 mm exophytic lesion consistent with a hyperdense cyst.  In the lower pole of the left kidney,  there is a stable cortical low density 12 mm lesion, image 61 also suggestive of a stable cyst.  Atherosclerosis is present of the aorta without aneurysm.
IMPRESSION: 1.  Acute obstructing left UPJ 11 mm calculus with moderately severe hydronephrosis. 
2.  Additional nonobstructing left UPJ calculus layering dependently in the dilated renal pelvis.  
3.  Stable probable left upper pole hyperdense cyst and left lower pole renal cyst.  
PELVIS CT WITHOUT CONTRAST:
FINDINGS: No additional ureteral calculus, dilatation, or UVJ abnormality.  Iliac atherosclerosis is present without aneurysm.  No acute pelvic inflammation, fluid collection, ascites, or bowel abnormality.
IMPRESSION: 1.  No distal ureteral or UVJ abnormality. 
2.  No acute intrapelvic finding.

## 2006-12-11 ENCOUNTER — Ambulatory Visit (HOSPITAL_BASED_OUTPATIENT_CLINIC_OR_DEPARTMENT_OTHER): Admission: RE | Admit: 2006-12-11 | Discharge: 2006-12-11 | Payer: Self-pay | Admitting: Urology

## 2008-02-24 ENCOUNTER — Emergency Department (HOSPITAL_COMMUNITY): Admission: EM | Admit: 2008-02-24 | Discharge: 2008-02-24 | Payer: Self-pay | Admitting: Emergency Medicine

## 2008-02-24 IMAGING — CT CT PELVIS W/O CM
2 of 4 series · 16 of 46 positions shown, 18 images · non-contrast
Comparison: Abdominal pelvic CT [DATE].

CT ABDOMEN

CLINICAL DATA: Left flank pain with nausea and vomiting.  History
of urinary tract calculi.

CT ABDOMEN AND PELVIS WITHOUT CONTRAST
TECHNIQUE: Multidetector CT imaging of the abdomen and pelvis was
performed following the standard
protocol without intravenous contrast.

[Series 2: stone w/o 5.0 b40f · axial · non-contrast · 0.74mm/px · z∈[+902,+1370]mm · 13 of 129 slices shown, 15 images]
[im 6/129  soft-tissue]
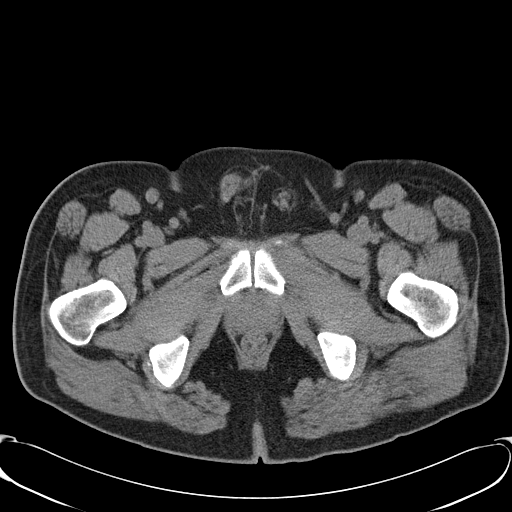
[im 6/129  bone]
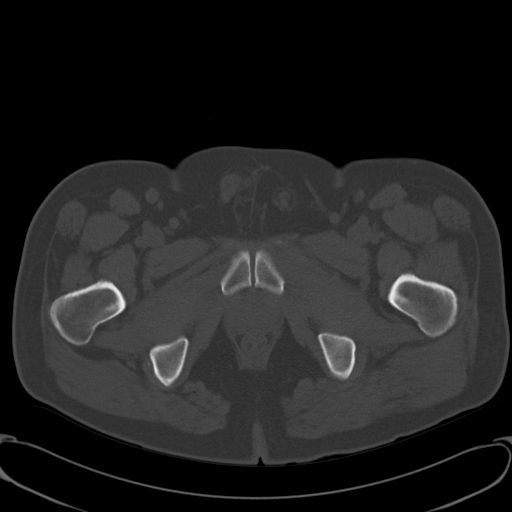
[im 16/129  soft-tissue]
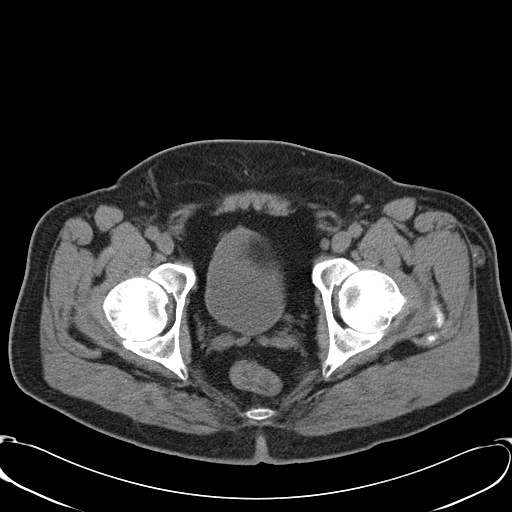
[im 26/129  soft-tissue]
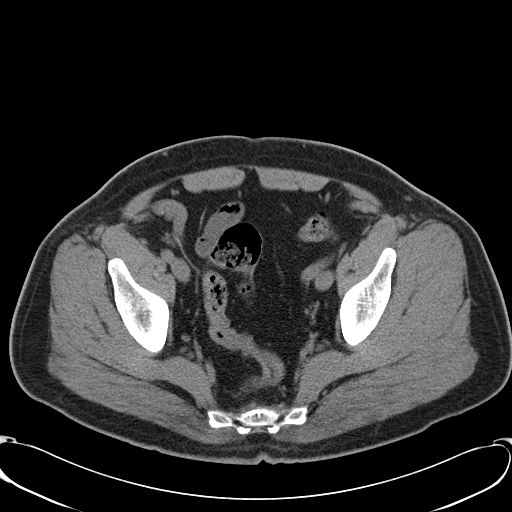
[im 36/129  soft-tissue]
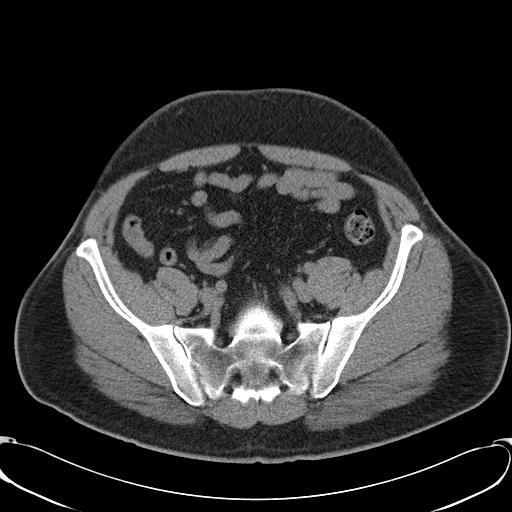
[im 47/129  soft-tissue]
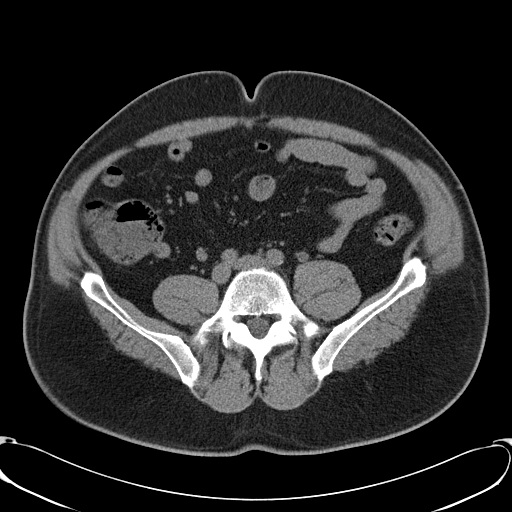
[im 57/129  soft-tissue]
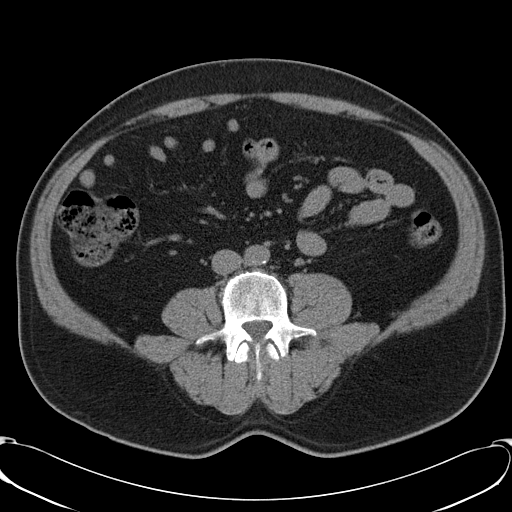
[im 67/129  soft-tissue]
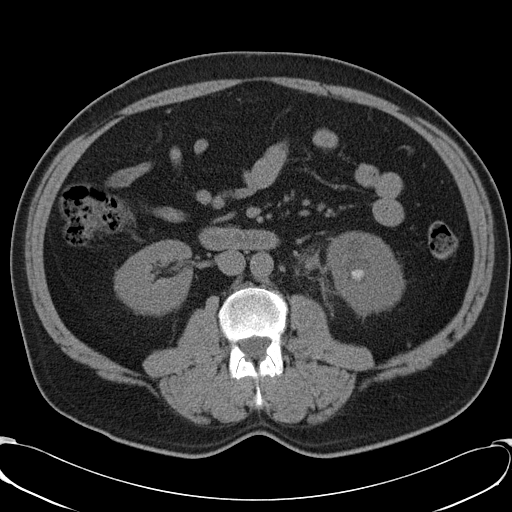
[im 72/129  soft-tissue]
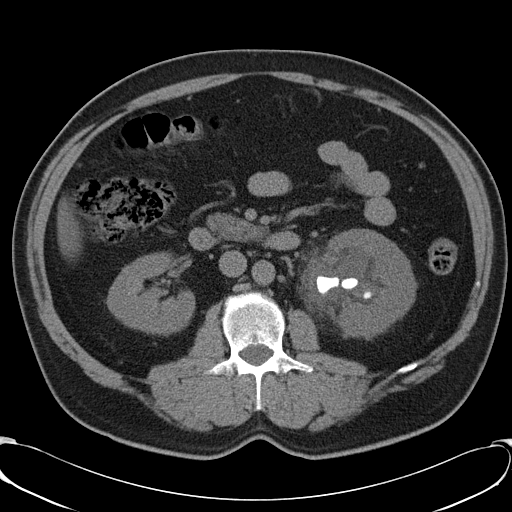
[im 82/129  soft-tissue]
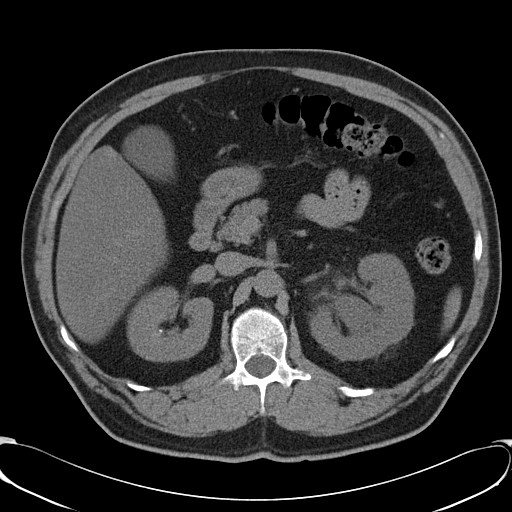
[im 82/129  bone]
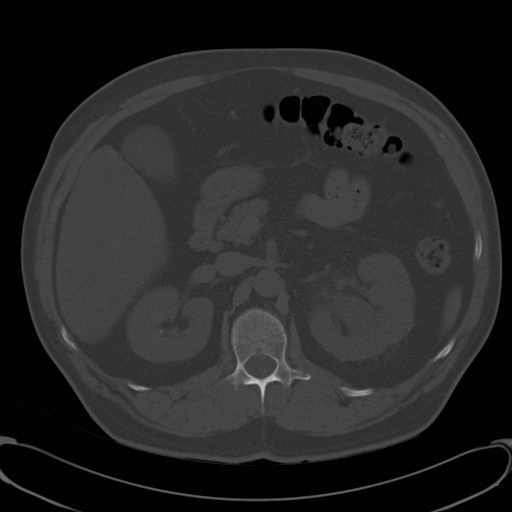
[im 93/129  soft-tissue]
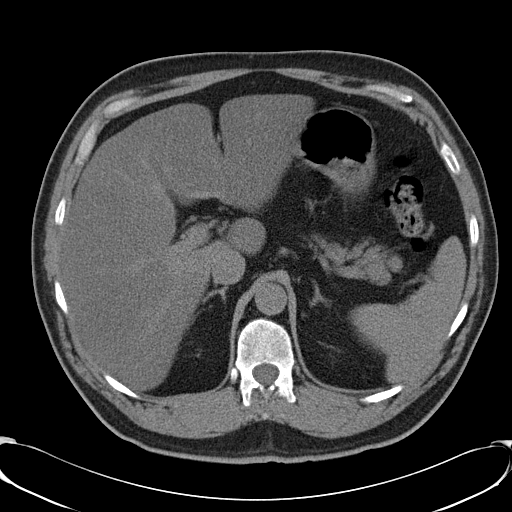
[im 103/129  soft-tissue]
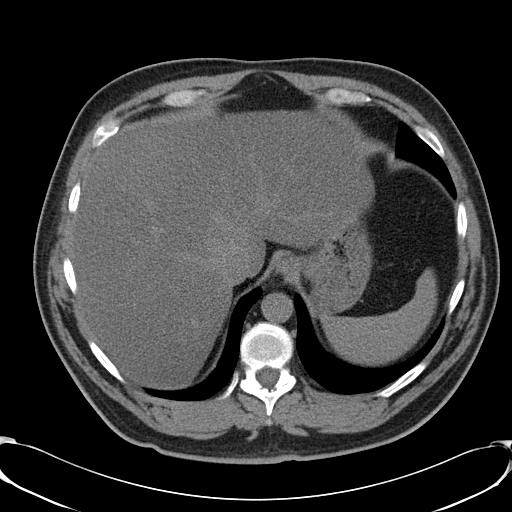
[im 113/129  soft-tissue]
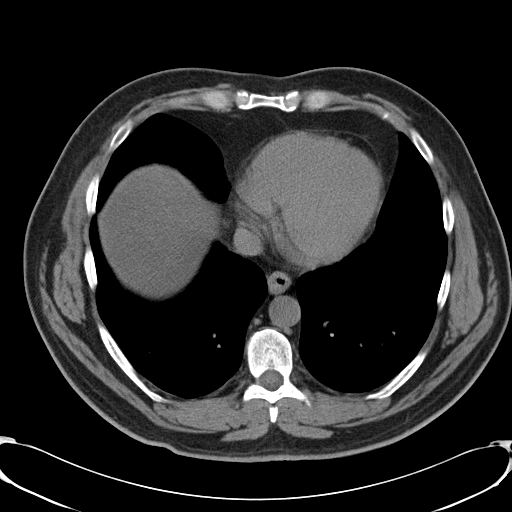
[im 123/129  soft-tissue]
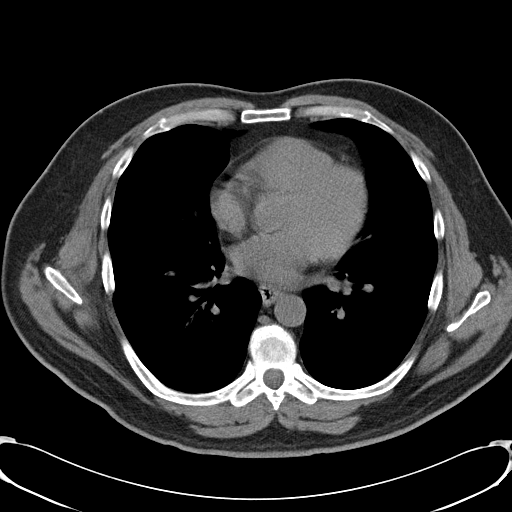

[Series 602: coronal · coronal · 1.00mm/px · 3 of 87 slices shown]
[im 29/87  soft-tissue]
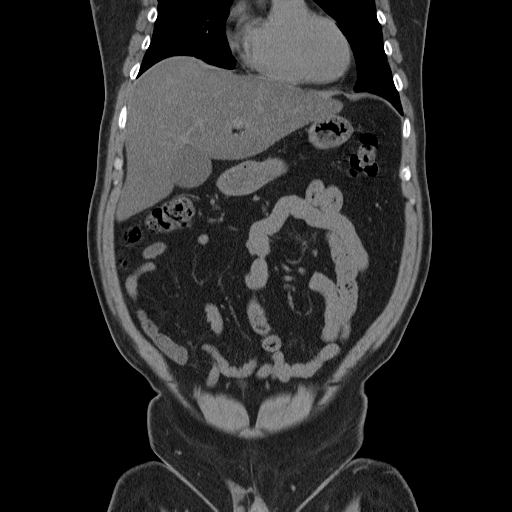
[im 39/87  soft-tissue]
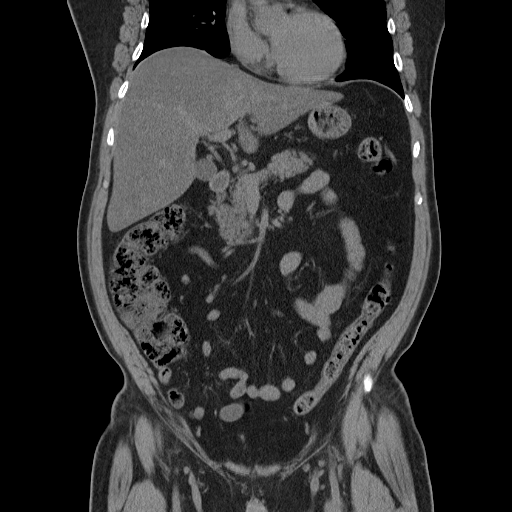
[im 48/87  soft-tissue]
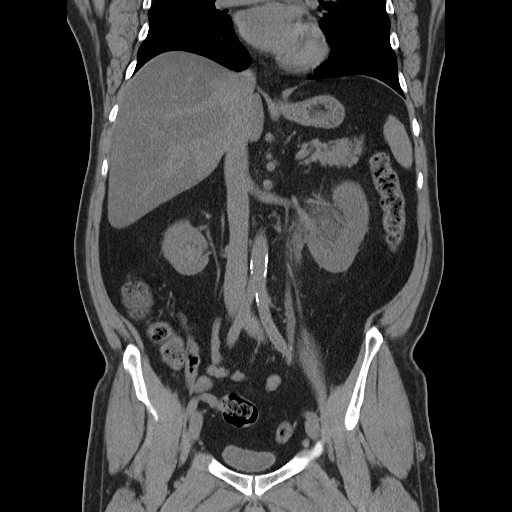

[16 of 46 positions shown; findings below may reference images not displayed]

FINDINGS: The patient has developed recurrent left-sided
hydronephrosis and hydroureter with associated perinephric and
periureteral soft tissue stranding.  There are enlarging calculi in
the left renal pelvis and lower pole calyces.  A calculus in the
left renal pelvis measures up to 1.6 cm in diameter on image 58.
There is an obstructing left ureteral calculus in the pelvis which
is described below.  No right-sided hydronephrosis is present.
There is a single nonobstructing calculus in the upper pole of the
right kidney.

The liver demonstrates diffuse fatty infiltration without focal
abnormality.  The spleen, adrenal glands, pancreas and gallbladder
appear normal.  The previously demonstrated hyperdense lesion
projecting from the upper pole of the left kidney has decreased in
size, now measuring approximately 6 mm in diameter on image 40.
IMPRESSION: 1.  Left ureteral obstruction by a calculus described in the pelvic
findings below.  This associated moderate hydronephrosis and
hydroureter with associated perinephric and periureteral
inflammatory change.
2.  Enlarging calculi in the left renal pelvis and lower pole
calyces. The calculi in the left renal pelvis may be contributing
to collecting system obstruction.
3.  Diffuse hepatic steatosis.

CT PELVIS
FINDINGS: The left ureter is dilated to an obstructing 3 mm
calculus at the level of the upper sacrum on image 88.  This is not
visible on the scout image.  No other ureteral or bladder calculi
are demonstrated.  There is no evidence of pelvic mass, fluid
collection or inflammatory process.
IMPRESSION: Obstructing 3 mm calculus in the left ureter at the level of the
false pelvis.

## 2008-08-04 ENCOUNTER — Emergency Department (HOSPITAL_COMMUNITY): Admission: EM | Admit: 2008-08-04 | Discharge: 2008-08-04 | Payer: Self-pay | Admitting: Emergency Medicine

## 2008-08-04 IMAGING — CR DG ABDOMEN 1V
2 series · 2 of 2 positions shown · non-contrast
Comparison: CT scan from [DATE].

CLINICAL DATA: Flank pain.  Kidney stone.

ABDOMEN - 1 VIEW

[t abdomen supine (1 of 2)]
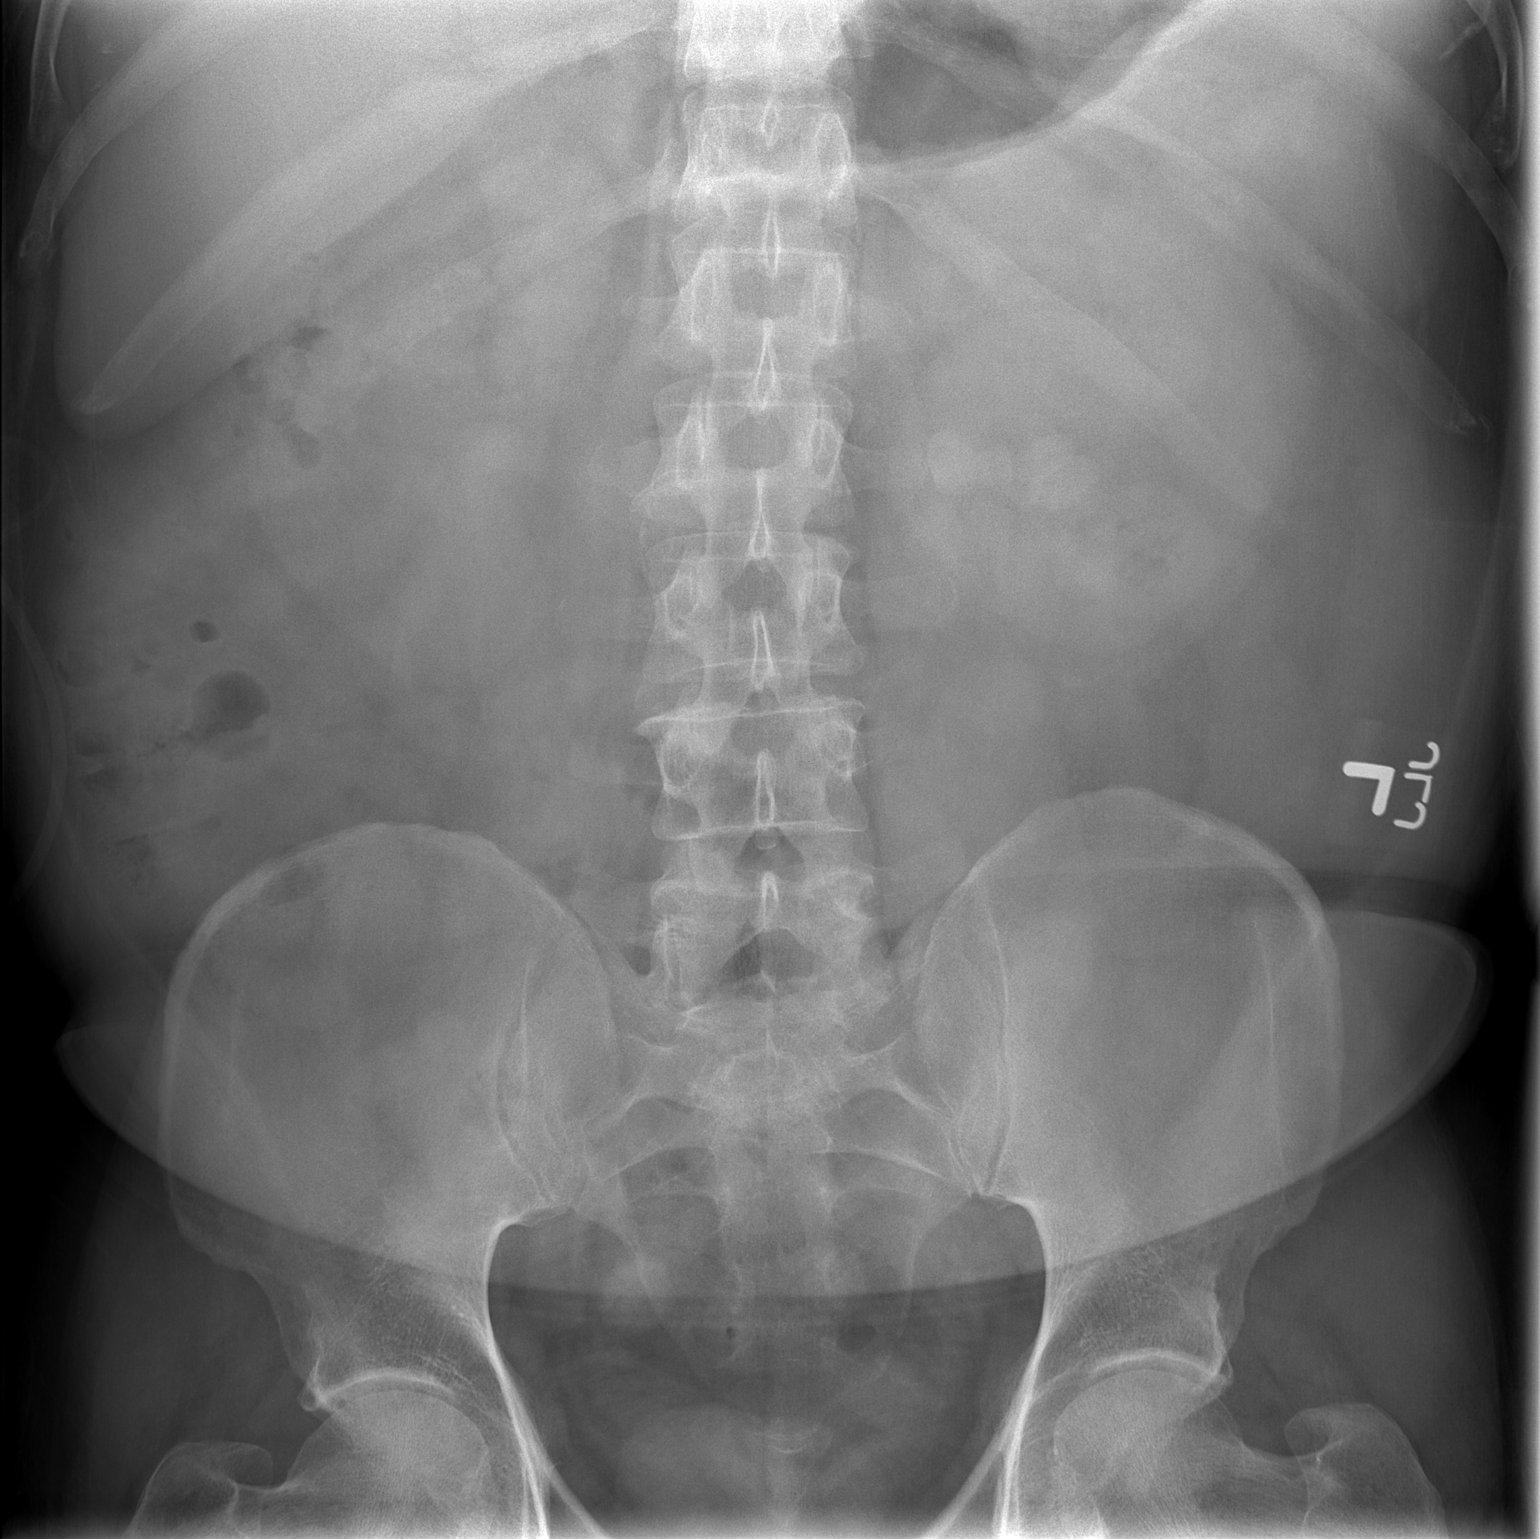

[t abdomen supine (2 of 2)]
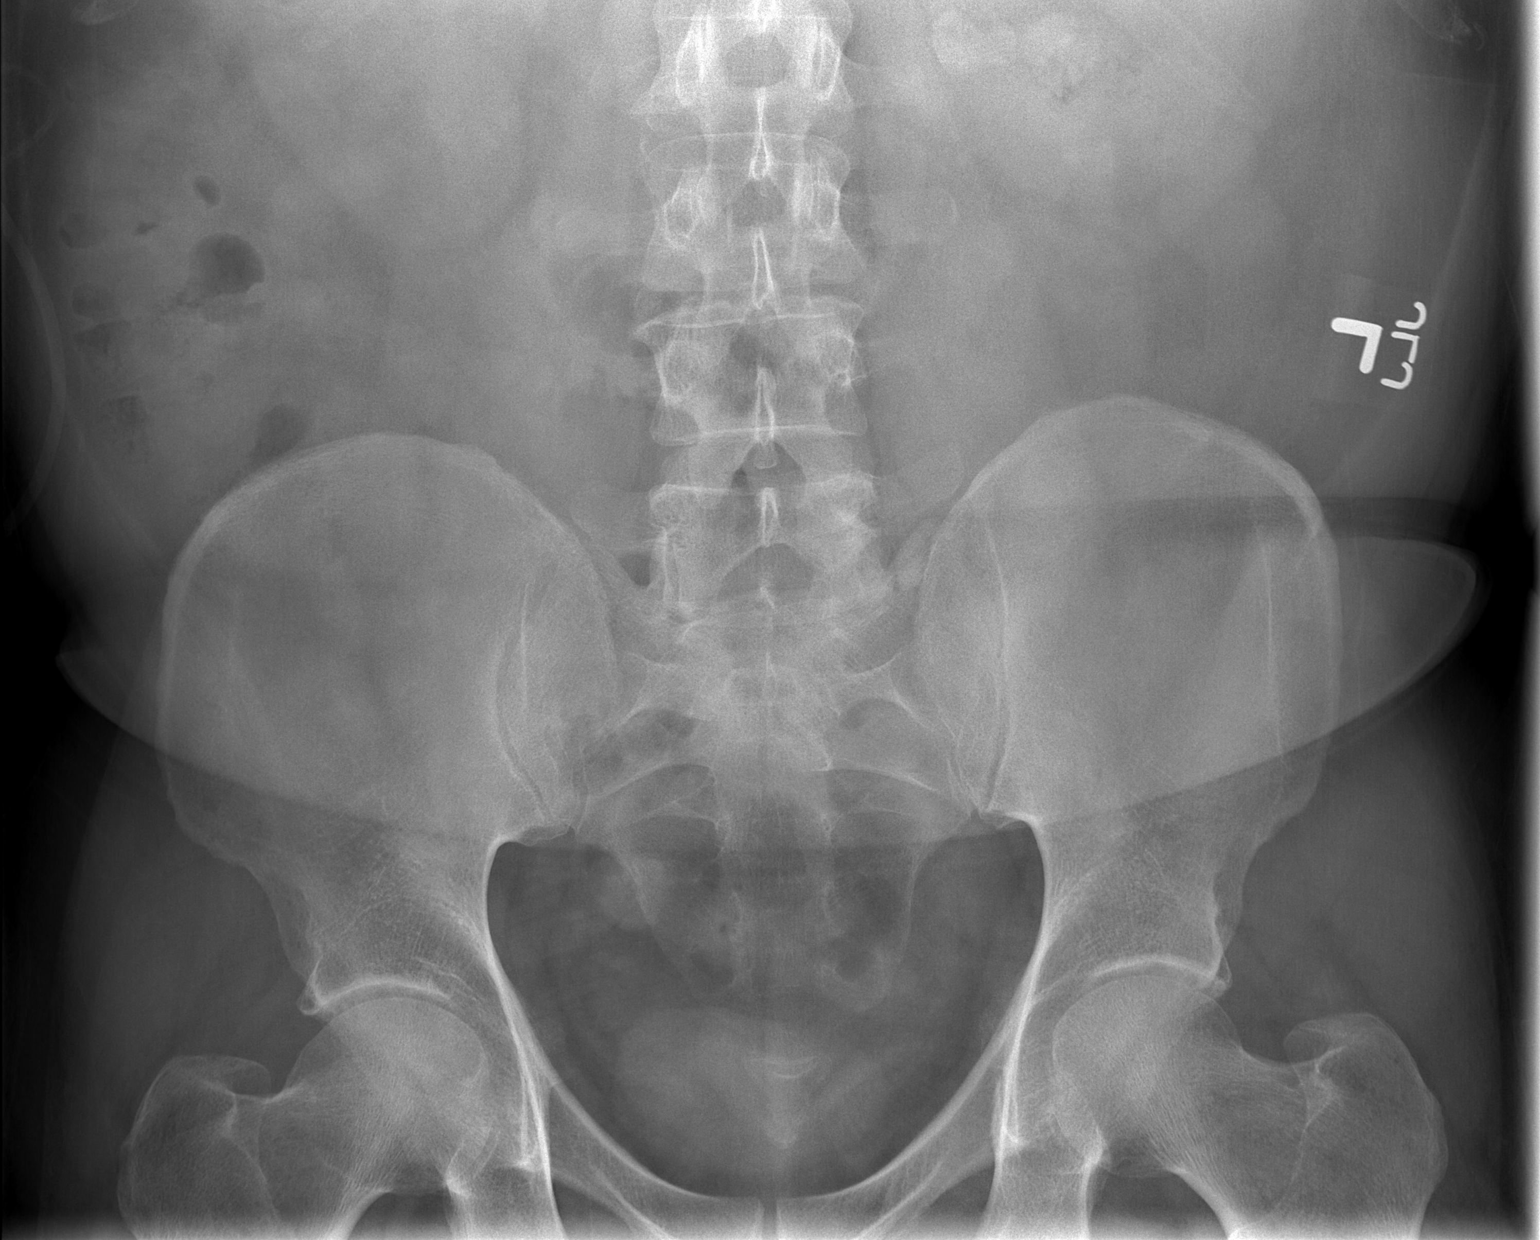

[2 of 2 positions shown; findings below may reference images not displayed]

FINDINGS: Supine abdomen shows multiple large stones in the central
aspect of the left kidney.  These are better characterized on the
recent CT scan.  No definite stones seen in the region of the right
kidney.  No evidence for radiopaque calculus along the course of
either ureter.

Bowel gas pattern is nonspecific.  The visualized bony structures
are unremarkable.
IMPRESSION: Multiple left renal calculi.

## 2008-09-09 ENCOUNTER — Emergency Department (HOSPITAL_COMMUNITY): Admission: EM | Admit: 2008-09-09 | Discharge: 2008-09-09 | Payer: Self-pay | Admitting: Emergency Medicine

## 2008-09-09 IMAGING — CR DG ABDOMEN 1V
2 series · 2 of 2 positions shown · non-contrast
Comparison: [DATE]

CLINICAL DATA: Abdominal pain.  Kidney stone.

ABDOMEN - 1 VIEW

[t abdomen supine (1 of 2)]
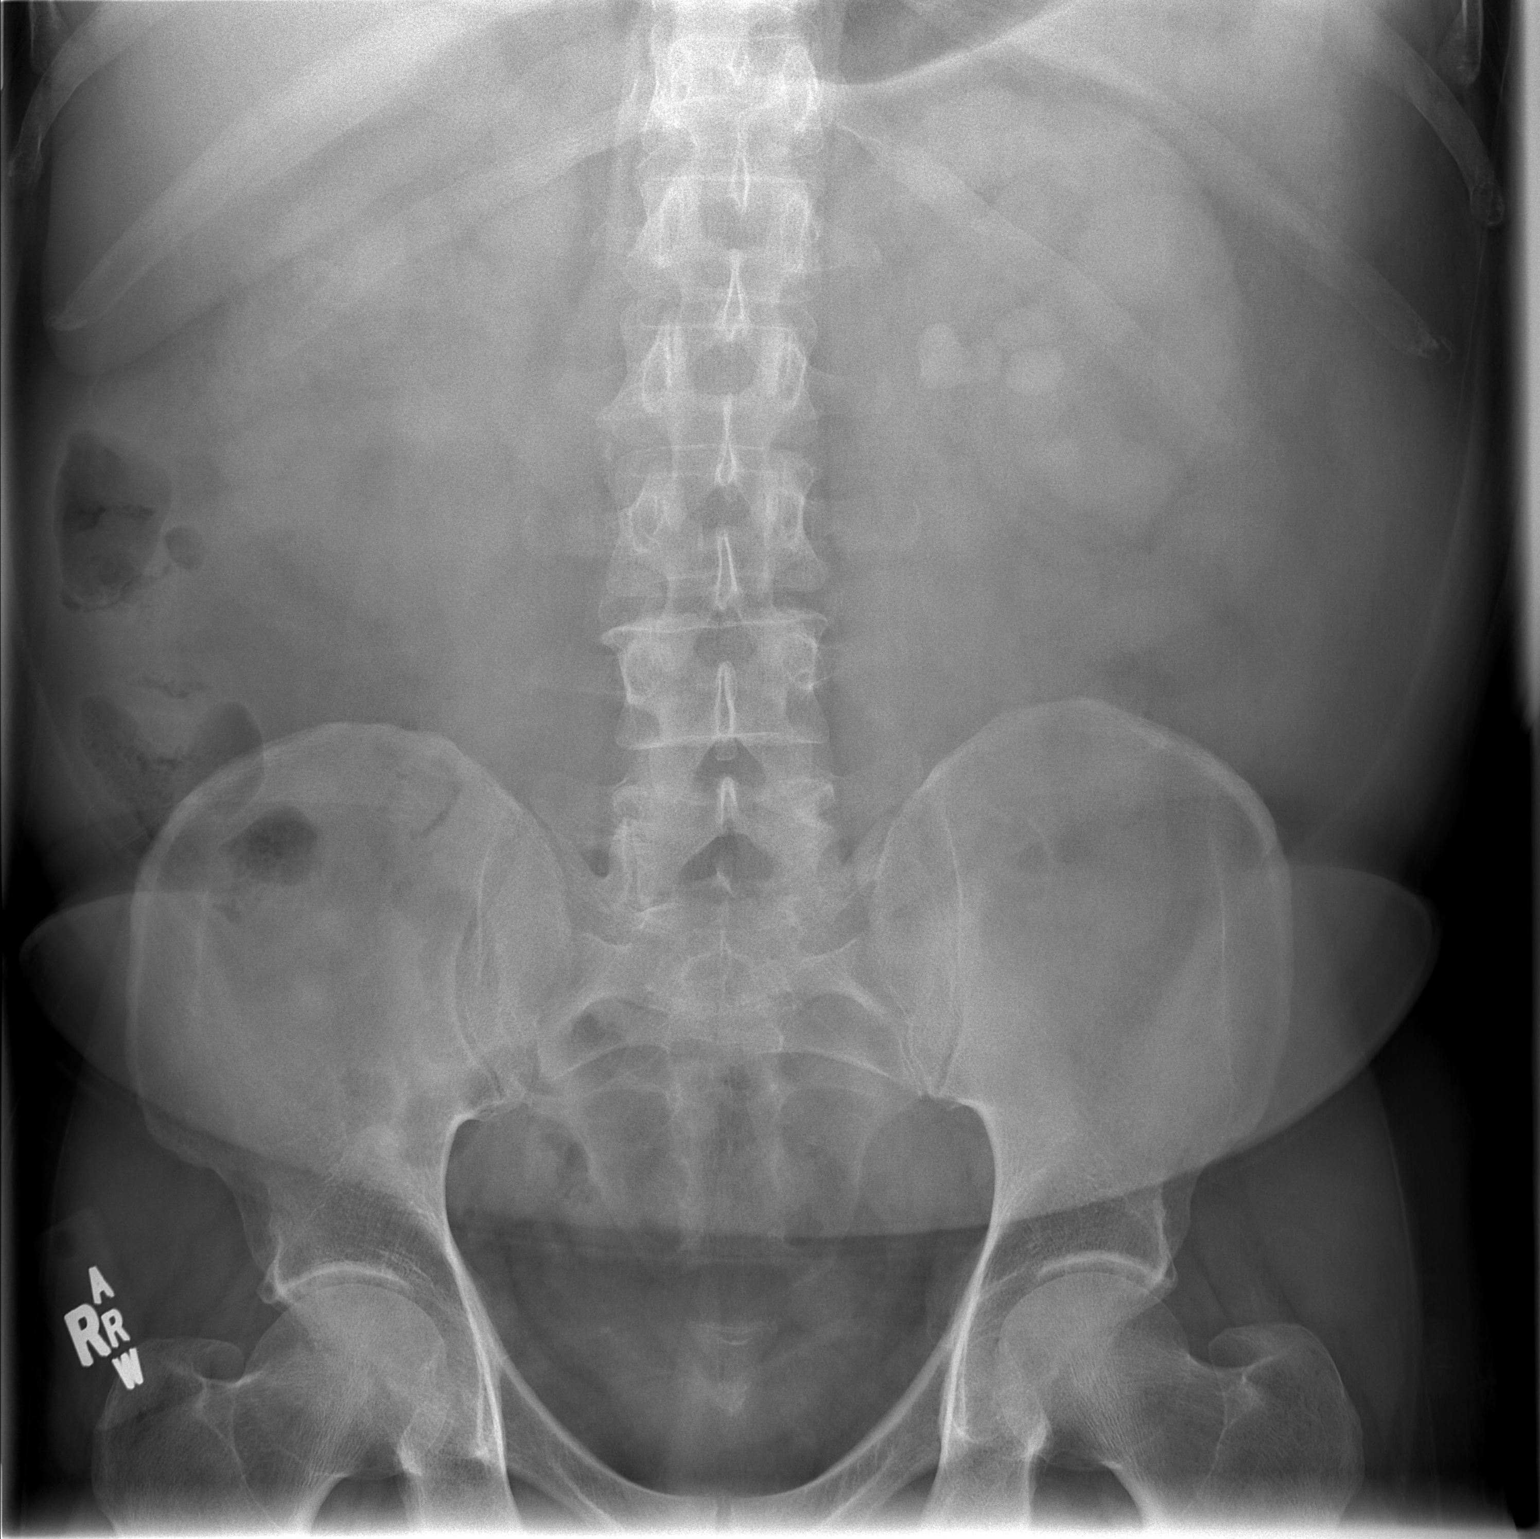

[t abdomen supine (2 of 2)]
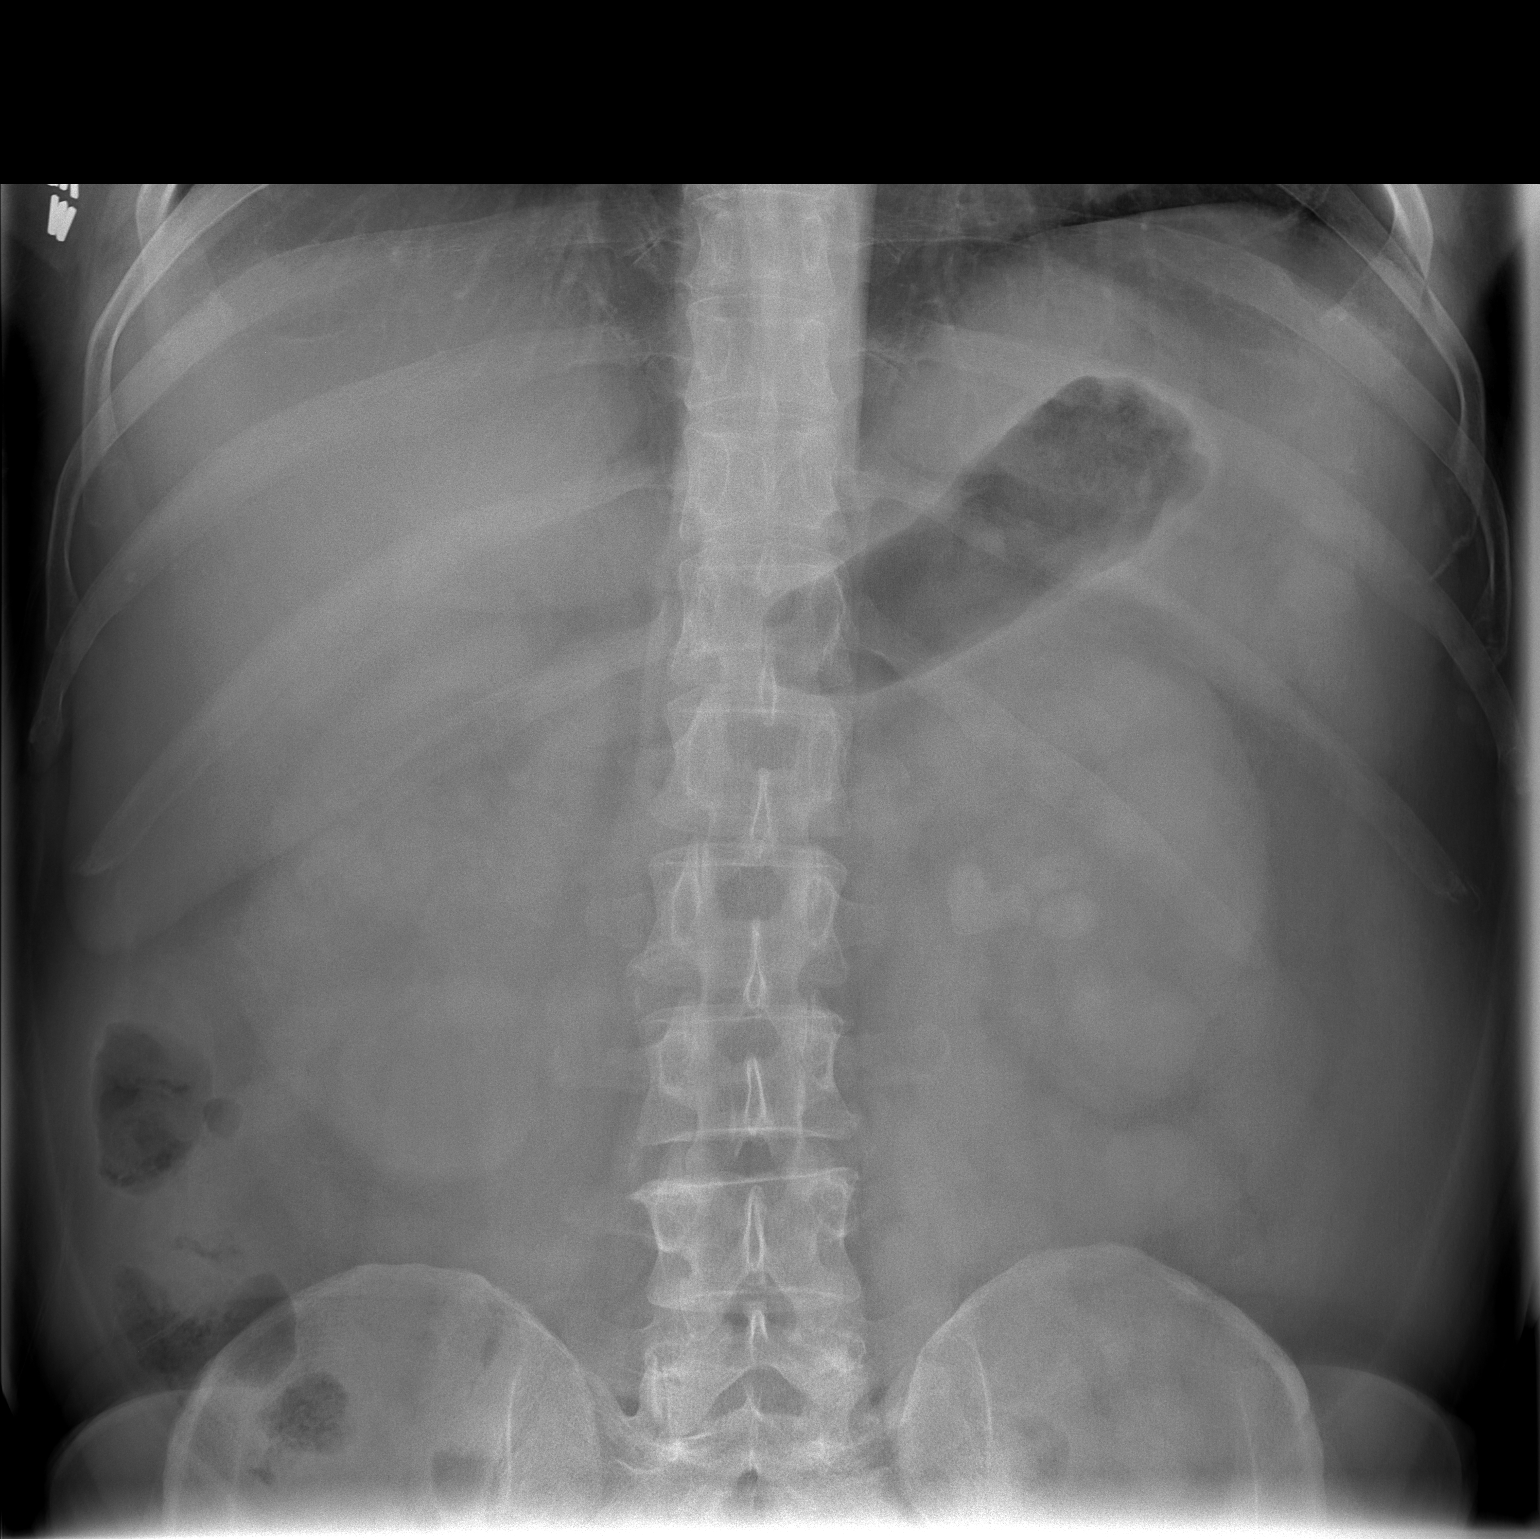

[2 of 2 positions shown; findings below may reference images not displayed]

FINDINGS: Partial staghorn calculus on the left.  Multiple stones
in the medial aspect left kidney the largest measuring 1.8 x
cm.  No definite ureteral calculi. Bowel gas pattern unremarkable.
IMPRESSION: Left renal calculi.

## 2010-04-25 ENCOUNTER — Emergency Department (HOSPITAL_COMMUNITY): Admission: EM | Admit: 2010-04-25 | Discharge: 2010-04-25 | Payer: Self-pay | Admitting: Family Medicine

## 2011-01-10 ENCOUNTER — Encounter (HOSPITAL_COMMUNITY): Payer: Self-pay

## 2011-01-10 ENCOUNTER — Emergency Department (HOSPITAL_COMMUNITY): Payer: Self-pay

## 2011-01-10 ENCOUNTER — Emergency Department (HOSPITAL_COMMUNITY)
Admission: EM | Admit: 2011-01-10 | Discharge: 2011-01-10 | Disposition: A | Discharge status: Home / Self Care | Payer: Self-pay | Attending: Emergency Medicine | Admitting: Emergency Medicine

## 2011-01-10 DIAGNOSIS — Z87442 Personal history of urinary calculi: Secondary | ICD-10-CM | POA: Insufficient documentation

## 2011-01-10 DIAGNOSIS — R112 Nausea with vomiting, unspecified: Secondary | ICD-10-CM | POA: Insufficient documentation

## 2011-01-10 DIAGNOSIS — N200 Calculus of kidney: Secondary | ICD-10-CM | POA: Insufficient documentation

## 2011-01-10 DIAGNOSIS — R109 Unspecified abdominal pain: Secondary | ICD-10-CM | POA: Insufficient documentation

## 2011-01-10 LAB — URINALYSIS, ROUTINE W REFLEX MICROSCOPIC
Nitrite: NEGATIVE
Specific Gravity, Urine: 1.018 (ref 1.005–1.030)
Urobilinogen, UA: 0.2 mg/dL (ref 0.0–1.0)
pH: 6.5 (ref 5.0–8.0)

## 2011-01-10 LAB — URINE MICROSCOPIC-ADD ON

## 2011-01-10 IMAGING — CT CT ABD-PELV W/O CM
2 of 4 series · 16 of 46 positions shown, 18 images · non-contrast
Comparison: [DATE]

CLINICAL DATA: Right flank pain for 1-2 days.  Nausea and vomiting.
History of kidney stones.

CT ABDOMEN AND PELVIS WITHOUT CONTRAST
TECHNIQUE: Multidetector CT imaging of the abdomen and pelvis was
performed following the standard protocol without intravenous
contrast.

[Series 2: under 200# stone no prev · axial · 0.75mm/px · z∈[-320,+110]mm · 13 of 94 slices shown, 15 images]
[im 4/94  soft-tissue]
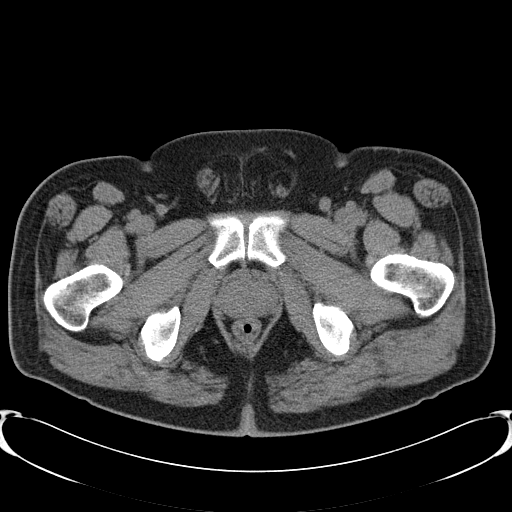
[im 4/94  bone]
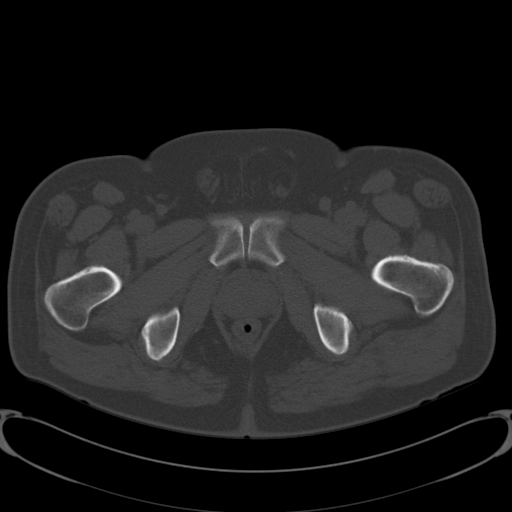
[im 11/94  soft-tissue]
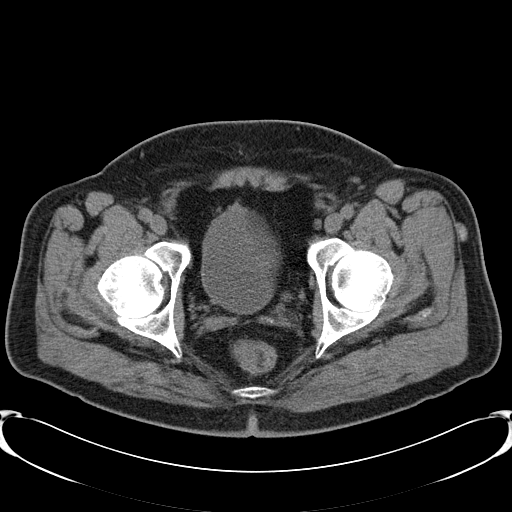
[im 18/94  soft-tissue]
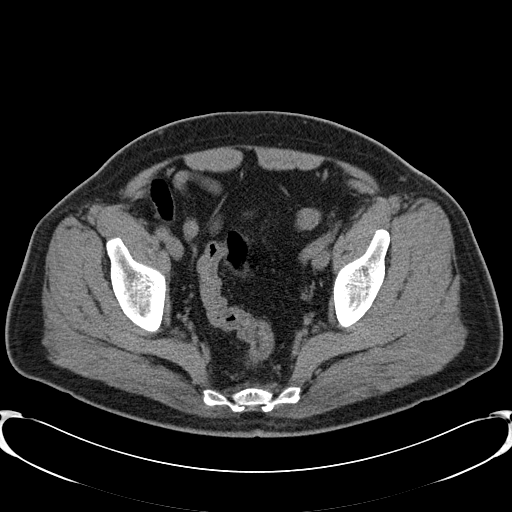
[im 26/94  soft-tissue]
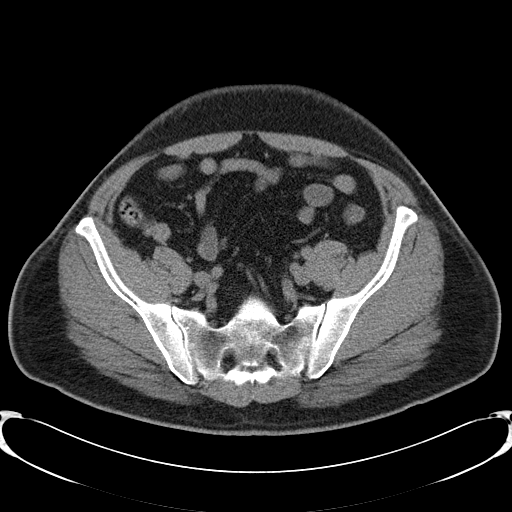
[im 33/94  soft-tissue]
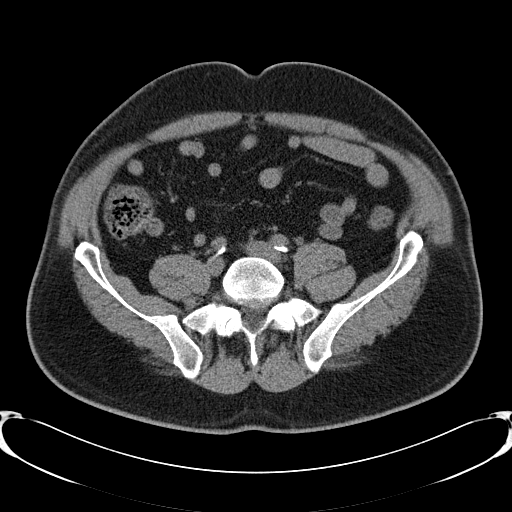
[im 40/94  soft-tissue]
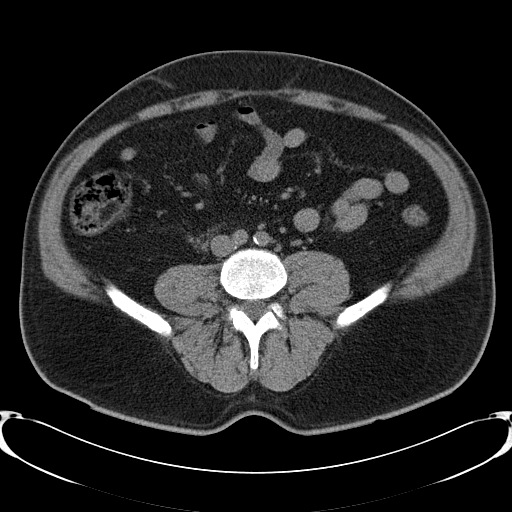
[im 47/94  soft-tissue]
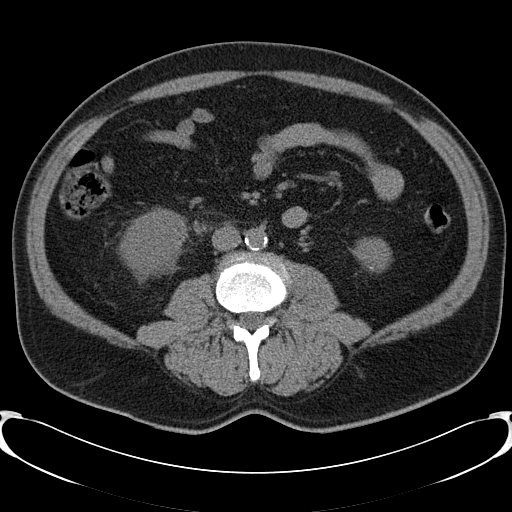
[im 54/94  soft-tissue]
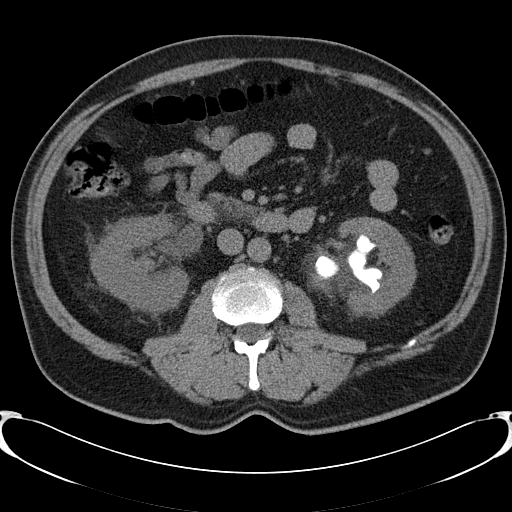
[im 61/94  soft-tissue]
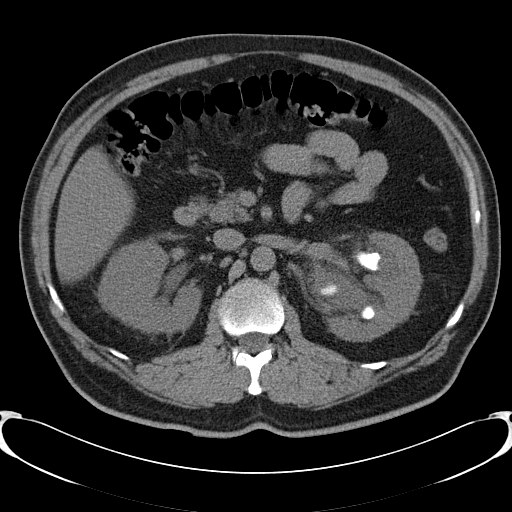
[im 61/94  bone]
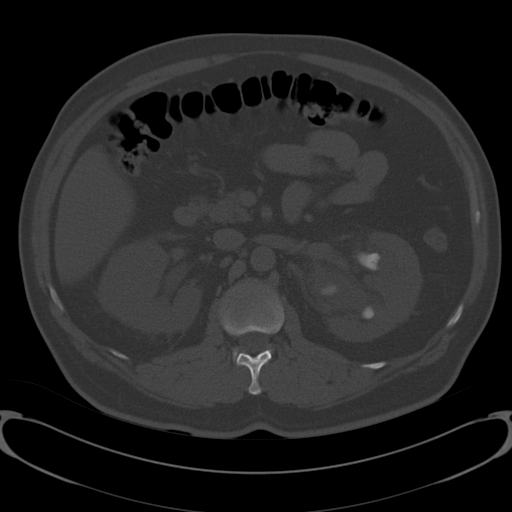
[im 68/94  soft-tissue]
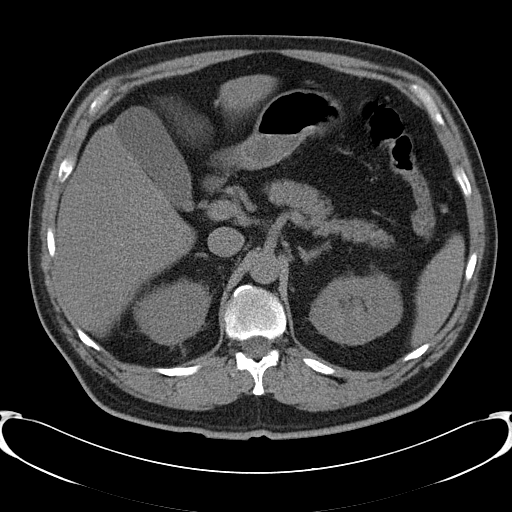
[im 76/94  soft-tissue]
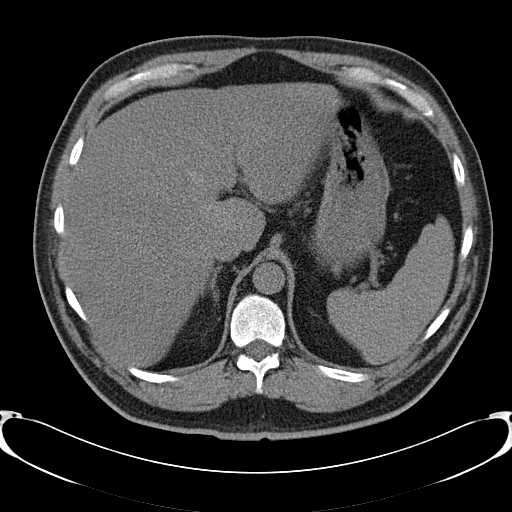
[im 83/94  soft-tissue]
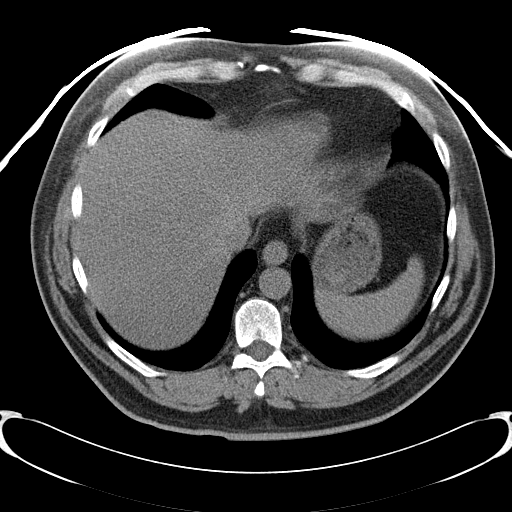
[im 90/94  soft-tissue]
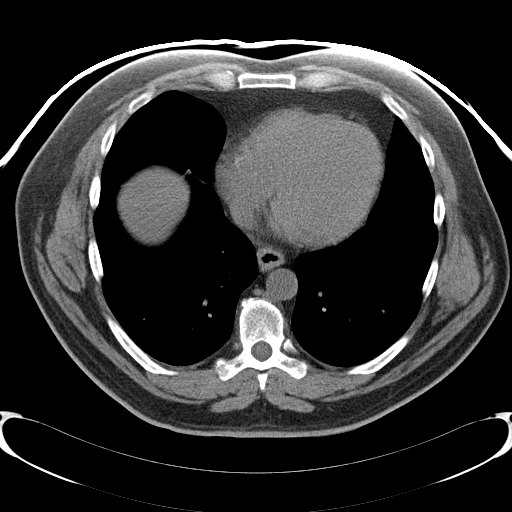

[Series 602: <mpr thick range> · coronal · 0.95mm/px · 3 of 89 slices shown]
[im 30/89  soft-tissue]
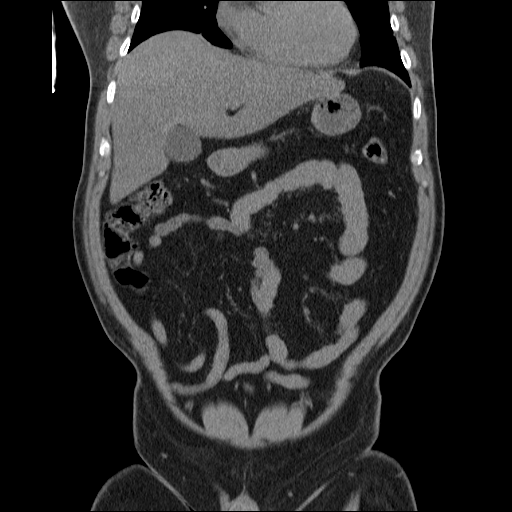
[im 40/89  soft-tissue]
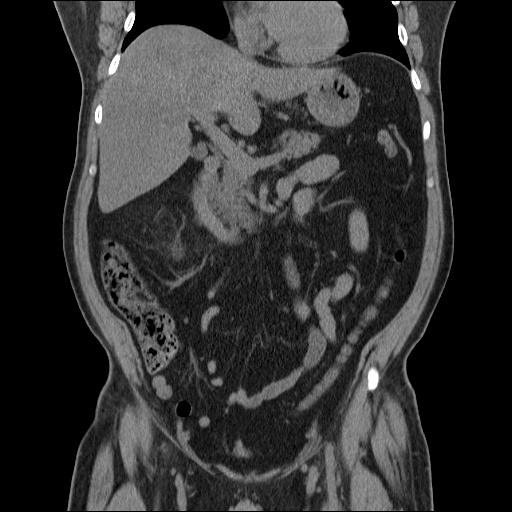
[im 49/89  soft-tissue]
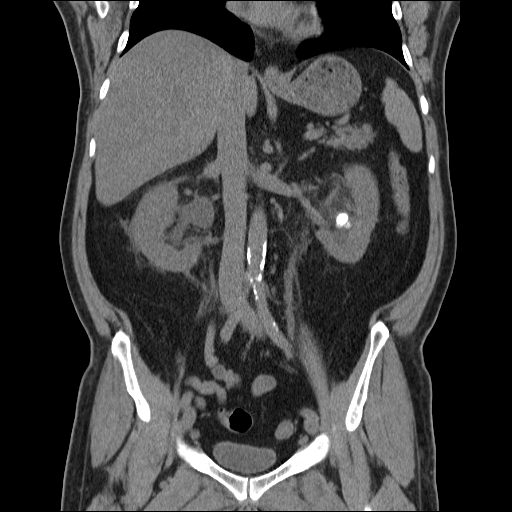

[16 of 46 positions shown; findings below may reference images not displayed]

FINDINGS: Mild dependent atelectasis in the lung bases.  The left
kidney demonstrates a large staghorn calculus occupying much of the
renal pelvis and lower pole calyces with additional large stones in
the upper pole.  Upper poles stone measures about 12 mm diameter.
Staghorn calculus measures up to about 5 x 3.7 cm diameter.  The
stones have enlarged significantly since the previous study.  There
is pyelocaliectasis and ureterectasis with stranding around the
renal pelvis.  Changes are consistent with obstruction although
infection can also have this appearance.  The distal left ureter is
decompressed.  Parenchymal low attenuation lesions in the lower
pole likely representing small cysts.  The right kidney
demonstrates a 5 mm stone in the ureteropelvic junction with
proximal pyelocaliectasis consistent with moderate obstruction.
The ureter is decompressed but there is inflammatory stranding
around the proximal ureter.  No distal ureteral or bladder stones
are demonstrated.

The bladder wall is not thickened.  The unenhanced liver, spleen,
gallbladder, bile ducts, pancreas, adrenal glands, and small bowel
are unremarkable.  Small accessory spleen.  Minimal calcification
in the nonaneurysmal aorta.  No abdominal ascites or free air.  No
retroperitoneal lymphadenopathy.

Pelvis:  No free or loculated pelvic fluid collections.  The
prostate gland is not enlarged.  The appendix is not visualized but
no inflammatory changes are demonstrated in the right lower
quadrant.  Mild degenerative changes in the spine.
IMPRESSION: Staghorn calculus in the left kidney with pyelocaliectasis and
surrounding infiltration suggesting obstruction and / or infection.
5 mm moderately obstructing stone in the right ureteropelvic
junction.

## 2011-01-11 LAB — URINE CULTURE

## 2011-02-11 NOTE — H&P (Signed)
[name], OLD NO.:  [id number]   MEDICAL RECORD NO.:  [id number]          PATIENT TYPE:  OBV   LOCATION:  1430                         FACILITY:  [facility]   PHYSICIAN:  Heloise Purpura, MD      DATE OF BIRTH:  1956/02/17   DATE OF ADMISSION:  07/09/2006  DATE OF DISCHARGE:                                HISTORY & PHYSICAL   CHIEF COMPLAINT:  Left flank pain.   HISTORY:  [name] is a 55 year old gentleman with a history of kidney  stones who has been followed by Dr. Retta Diones in the past.  He began having  the onset of left-sided flank pain in the last 24 hours with radiation to  his left upper quadrant and associated nausea and vomiting.  He was seen in  the Emergency Department earlier this morning and was subsequently able to  be discharged home after his pain was controlled.  Unfortunately, his nausea  and vomiting persisted and he was unable to tolerate oral pain medication  and needed to return to the Emergency Department for further IV pain  control.  The patient believes that he has had a history of uric acid stones  in the past.  He denies any fever.   PAST MEDICAL HISTORY:  1. Urolithiasis.  2. Osteoarthritis.   PAST SURGICAL HISTORY:  1. Appendectomy.  2. Shock wave lithotripsy x2.  3. Right knee arthroscopy.   MEDICATIONS:  Relafen.   ALLERGIES:  No known drug allergies.   SOCIAL HISTORY:  The patient denies tobacco use.  He drinks alcohol  occasionally.   REVIEW OF SYSTEMS:  A complete review of systems was performed.  Pertinent  positives include nausea and vomiting as well as constipation.  All other  systems were reviewed and are otherwise negative.   PHYSICAL EXAMINATION:  VITAL SIGNS:  Temperature 97.1, blood pressure  155/101, heart rate 79, respirations 22.  CONSTITUTIONAL:  The patient is a well-nourished, well-developed age-  appropriate male in no acute distress.  HEENT:  Normocephalic, atraumatic.  Oropharynx is clear.  NECK:  No JVD.  CARDIOVASCULAR:  Regular rate and rhythm.  LUNGS:  Clear bilaterally.  ABDOMEN:  The patient is tender to palpation over his left upper quadrant  and does have mild left CVA tenderness.  He is otherwise nondistended and  without abdominal masses.  GU:  Normal male genitalia.  EXTREMITIES:  No edema.  NEUROLOGIC:  Grossly intact.   Urinalysis:  pH 5.5, white blood cells 3-6,  red blood cells too numerous to  count, bacteria rare.   LABORATORY STUDIES:  Sodium 137, potassium 6.0, chloride 106, CO2 25,  creatinine 0.7, BUN 17, glucose 142.  The patient's potassium was replaced  due to his hyperkalemia and was found to be 5.4.   IMAGING:  A CT scan was performed and was independently reviewed.  This  demonstrates a 4 mm left proximal ureteral stone with hydronephrosis above  the level of the stone.  There is also a 9 mm left renal calculus in the  renal pelvis.  Both of the stones are not  well visualized on the scout film.   IMPRESSION:  Obstructing left ureteral calculus and a 9 mm left renal  calculus.  1. We will plan to admit the patient for IV fluid hydration as well as IV      pain control.  2. Strain urine.  3. Flomax.  4. Urinary alkalinization as the patient's urinalysis and imaging studies      are consistent with uric acid stones as well as his history.  5. The patient will be monitored overnight and given a chance to      conservatively pass his stone.           ______________________________  Heloise Purpura, MD  Electronically Signed     LB/MEDQ  D:  07/09/2006  T:  07/10/2006  Job:  [id number]

## 2011-02-11 NOTE — Op Note (Signed)
NAMEBRAIDON, [name]:  [id number]   MEDICAL RECORD NO.:  [id number]          PATIENT TYPE:  INP   LOCATION:  0099                         FACILITY:  [facility]   PHYSICIAN:  Bertram Millard. Dahlstedt, M.D.DATE OF BIRTH:  Apr 12, 1956   DATE OF PROCEDURE:  07/11/2006  DATE OF DISCHARGE:  07/09/2006                                 OPERATIVE REPORT   PREOPERATIVE DIAGNOSIS:  Left ureteral and renal calculi.   POSTOPERATIVE DIAGNOSIS:  Left ureteral and renal calculi.   PROCEDURES:  Cystoscopy, left retrograde ureteral pyelogram, left double-J  stent placement.   SURGEON:  Bertram Millard. Dahlstedt, M.D.   ANESTHESIA:  General with LMA.   COMPLICATIONS:  None.   BRIEF HISTORY:  A 55 year old gentleman who presented 2 days ago to the  emergency room with intractable left flank pain.  He had gone to the  emergency room twice actually, and was admitted stone the night because of  this pain.  He has had persistent pain and nonpassage of a left proximal  ureteral stone that is approximately 4 mm in size.  Additionally, he has a 9  mm left renal pelvic stone.  He has a history of uric acid calculi.  Stones  are not evident on KUB, so lithotripsy is not an option.   It was recommended that patient undergo cyst on left retrograde as well as  stent placement.  He will be placed on Urocit K to afford [place] chemolysis of  the stone.  He presents at this time for that procedure.   DESCRIPTION OF PROCEDURE:  The patient was administered preoperative IV  antibiotics and taken to the operating room where general anesthetic was  administered.  He was placed in the dorsal lithotomy position.  Genitalia  and perineum were prepped and draped.  A 22-French panendoscope was advanced  in his urethra which was normal.  Prostate nonobstructive with some small  superficial calculi on the urothelium.  The bladder was inspected  circumferentially.  No tumor speculations or foreign bodies.   The  left ureter was cannulated with an end-hole catheter and retrograde  performed.  The column of contrast up the entire ureter was normal until a  filling defect was evident at the left UPJ.  There was mild pyelocaliectasis  beyond this.  No filling defects were seen within the renal pelvis.   Following this, a guidewire was eventually advanced past the stone in the  proximal ureter into the left renal pelvis.  Over top of this a 5-French x  24 cm Polaris stent was placed, with the string removed.  Adequate  positioning was identified both cystoscopically and radiographically.   At this point, the bladder was drained and the scope removed.   The patient tolerated the procedure well.  He was awakened and taken to PACU  in stable condition.   He will be discharged on the Percocet that he has at home, Urelle one p.o.  q.6h. p.r.n. dysuria and Urocit K 10 mEq one p.o. q.a.c. and h.s.  He will  follow-up in 1 week.  Bertram Millard. Dahlstedt, M.D.  Electronically Signed     SMD/MEDQ  D:  07/11/2006  T:  07/12/2006  Job:  [id number]

## 2011-02-11 NOTE — Op Note (Signed)
[name], [name]                ACCOUNT NO.:  [id number]   MEDICAL RECORD NO.:  [id number]          PATIENT TYPE:  AMB   LOCATION:  [facility]                         FACILITY:  [facility]   PHYSICIAN:  Bertram Millard. Dahlstedt, M.D.DATE OF BIRTH:  09-24-1956   DATE OF PROCEDURE:  12/11/2006  DATE OF DISCHARGE:                               OPERATIVE REPORT   PREOPERATIVE DIAGNOSIS:  Left ureteropelvic junction stone with  hydronephrosis.   POSTOPERATIVE DIAGNOSIS:  Left ureteropelvic junction stone with  hydronephrosis.   PRINCIPAL PROCEDURE:  1. Cystoscopy.  2. Left retrograde ureteral pyelogram.  3. Left ureteroscopy (rigid and flexible).  4. Attempted basket and eventual holmium laser lithotripsy of renal      calculi.  5. Left double-J stent (6-French x 26 cm).   SURGEON:  Bertram Millard. Dahlstedt, M.D.   ANESTHESIA:  General with LMA.   COMPLICATIONS:  None.   BRIEF HISTORY:  [age]-year-old male with a history of left  urolithiasis.  He was treated for a left renal stone in 2007.  He  presented in early 2008 for stent extraction (after ureteroscopy, his  stent was left indwelling 3 months because of the patient not following  up).  He presented to my office last week with significant  hydronephrosis and a large stone in his left UPJ.   I have recommended that we proceed with ureteroscopy.  He has obvious  urine uric acid calculi, but this stone was so big and in an obstructing  position, I thought it would be worthwhile fragmenting the stone to  allow for easier passage.  Risks and complications of procedure have  been discussed with the patient.  He understands these and desires to  proceed.   DESCRIPTION OF PROCEDURE:  The patient was administered preoperative IV  antibiotics, the surgical side marked, and he was taken to the operating  room where a general anesthetic was administered using the LMA.  He was  placed in the dorsal lithotomy position.  Genitalia and  perineum were  prepped and draped.  A 22-French panendoscope was placed into his  bladder, urethra normal, prostate nonobstructive.  Bladder was entered  and inspected circumferentially, no tumors, no foreign bodies.  The left  ureter was then cannulated with an open-ended 6-French catheter and a  retrograde performed.  This showed a filling defect at the UPJ with  significant hydronephrosis proximal to this.  There was a filling defect  in the left upper pole calix as well.  A guidewire was then placed past  the UPJ stone, and the open-end catheter removed.  The 6 long rigid  ureteroscope was advanced up to the UPJ.  At this point, the stone had  been dislodged and was in his pelvis.  I then switched out the rigid  ureteroscope.  I then passed a 55-cm access sheath over top of the  guidewire.  I then inspected the renal pelvis.  It was quite capacious,  and then all the calyces were inspected.  I finally found the stones in  the upper pole calix.  There was 1 larger and 1 moderate-sized  stone.  I  then tried grasping the stones.  They were somewhat hard to grasp due to  the capacious nature of the calix.  Once they were grasped, I could not  move them past the UPJ.  I then replaced in the upper pole calix.  I  passed the 200-micron fiberoptic fiber and fragmented the stones using  the holmium laser.  The laser was placed at 1 joule with 5 Hz.  This  fragmented the stones quite nicely into multiple smaller fragments.  Due  to the proximal nature of the stones, I felt it judicious not to try to  extract these and injure the ureter.  Once adequate fragmentation was  achieved (approximately 45-60 minutes of the holmium laser), I felt that  the maximum benefit of fragmentation of the stones had been reached.  I  then removed the ureteroscope and the sheath.  I then placed a 26 cm x 6-  French Contour double-J stent over top of the guidewire, with good curl  seen proximally and distally using  fluoroscopic and cystoscopic  guidance, respectively.  The bladder was drained.  One to 2 small  fragments which had been washed out were sent for stone analysis.   At the at this point, the procedure was terminated.  The patient was  awakened and taken to the PACU in stable condition.   I will have him follow up in approximately 1 week.  He was discharged on  Flomax 0.4 mg one p.o. nightly for 5 nights, Cipro 250 mg b.i.d. for 3  days, and oxycodone for pain.      Bertram Millard. Dahlstedt, M.D.  Electronically Signed     SMD/MEDQ  D:  12/11/2006  T:  12/12/2006  Job:  [id number]

## 2011-06-22 LAB — CBC
HCT: 39.6
Hemoglobin: 14.1
MCV: 90.2
Platelets: 248
RBC: 4.38
WBC: 7.8

## 2011-06-22 LAB — POCT I-STAT, CHEM 8
BUN: 14
Creatinine, Ser: 0.9
Glucose, Bld: 137 — ABNORMAL HIGH
Sodium: 141
TCO2: 26

## 2011-06-22 LAB — DIFFERENTIAL
Eosinophils Relative: 4
Lymphocytes Relative: 36
Lymphs Abs: 2.8
Monocytes Relative: 8

## 2011-06-28 LAB — POCT I-STAT, CHEM 8
BUN: 17
Chloride: 104
Potassium: 3.9
Sodium: 141

## 2011-06-28 LAB — URINALYSIS, ROUTINE W REFLEX MICROSCOPIC
Bilirubin Urine: NEGATIVE
Protein, ur: NEGATIVE
Urobilinogen, UA: 0.2

## 2011-06-28 LAB — URINE MICROSCOPIC-ADD ON

## 2011-06-28 LAB — DIFFERENTIAL
Basophils Absolute: 0
Basophils Relative: 0
Eosinophils Absolute: 0.3
Monocytes Absolute: 0.6
Monocytes Relative: 7
Neutrophils Relative %: 66

## 2011-06-28 LAB — CBC
Hemoglobin: 15.4
MCHC: 34.1
MCV: 92.1
RBC: 4.91
RDW: 12.4

## 2011-06-30 LAB — URINALYSIS, ROUTINE W REFLEX MICROSCOPIC
Glucose, UA: 250 mg/dL — AB
Protein, ur: 100 mg/dL — AB
Specific Gravity, Urine: 1.019 (ref 1.005–1.030)
pH: 5.5 (ref 5.0–8.0)

## 2011-06-30 LAB — URINE MICROSCOPIC-ADD ON

## 2011-08-24 ENCOUNTER — Emergency Department (INDEPENDENT_AMBULATORY_CARE_PROVIDER_SITE_OTHER): Payer: Self-pay

## 2011-08-24 ENCOUNTER — Encounter (HOSPITAL_COMMUNITY): Payer: Self-pay

## 2011-08-24 ENCOUNTER — Emergency Department (INDEPENDENT_AMBULATORY_CARE_PROVIDER_SITE_OTHER)
Admission: EM | Admit: 2011-08-24 | Discharge: 2011-08-24 | Disposition: A | Discharge status: Home / Self Care | Payer: Self-pay | Source: Home / Self Care | Attending: Emergency Medicine | Admitting: Emergency Medicine

## 2011-08-24 DIAGNOSIS — J208 Acute bronchitis due to other specified organisms: Secondary | ICD-10-CM

## 2011-08-24 DIAGNOSIS — J209 Acute bronchitis, unspecified: Secondary | ICD-10-CM

## 2011-08-24 HISTORY — DX: Calculus of kidney: N20.0

## 2011-08-24 IMAGING — CR DG CHEST 2V
2 series · 2 of 2 positions shown · non-contrast
Comparison: None

CLINICAL DATA: Cough, congestion, fever

CHEST - 2 VIEW

[view not recorded (1 of 2)]
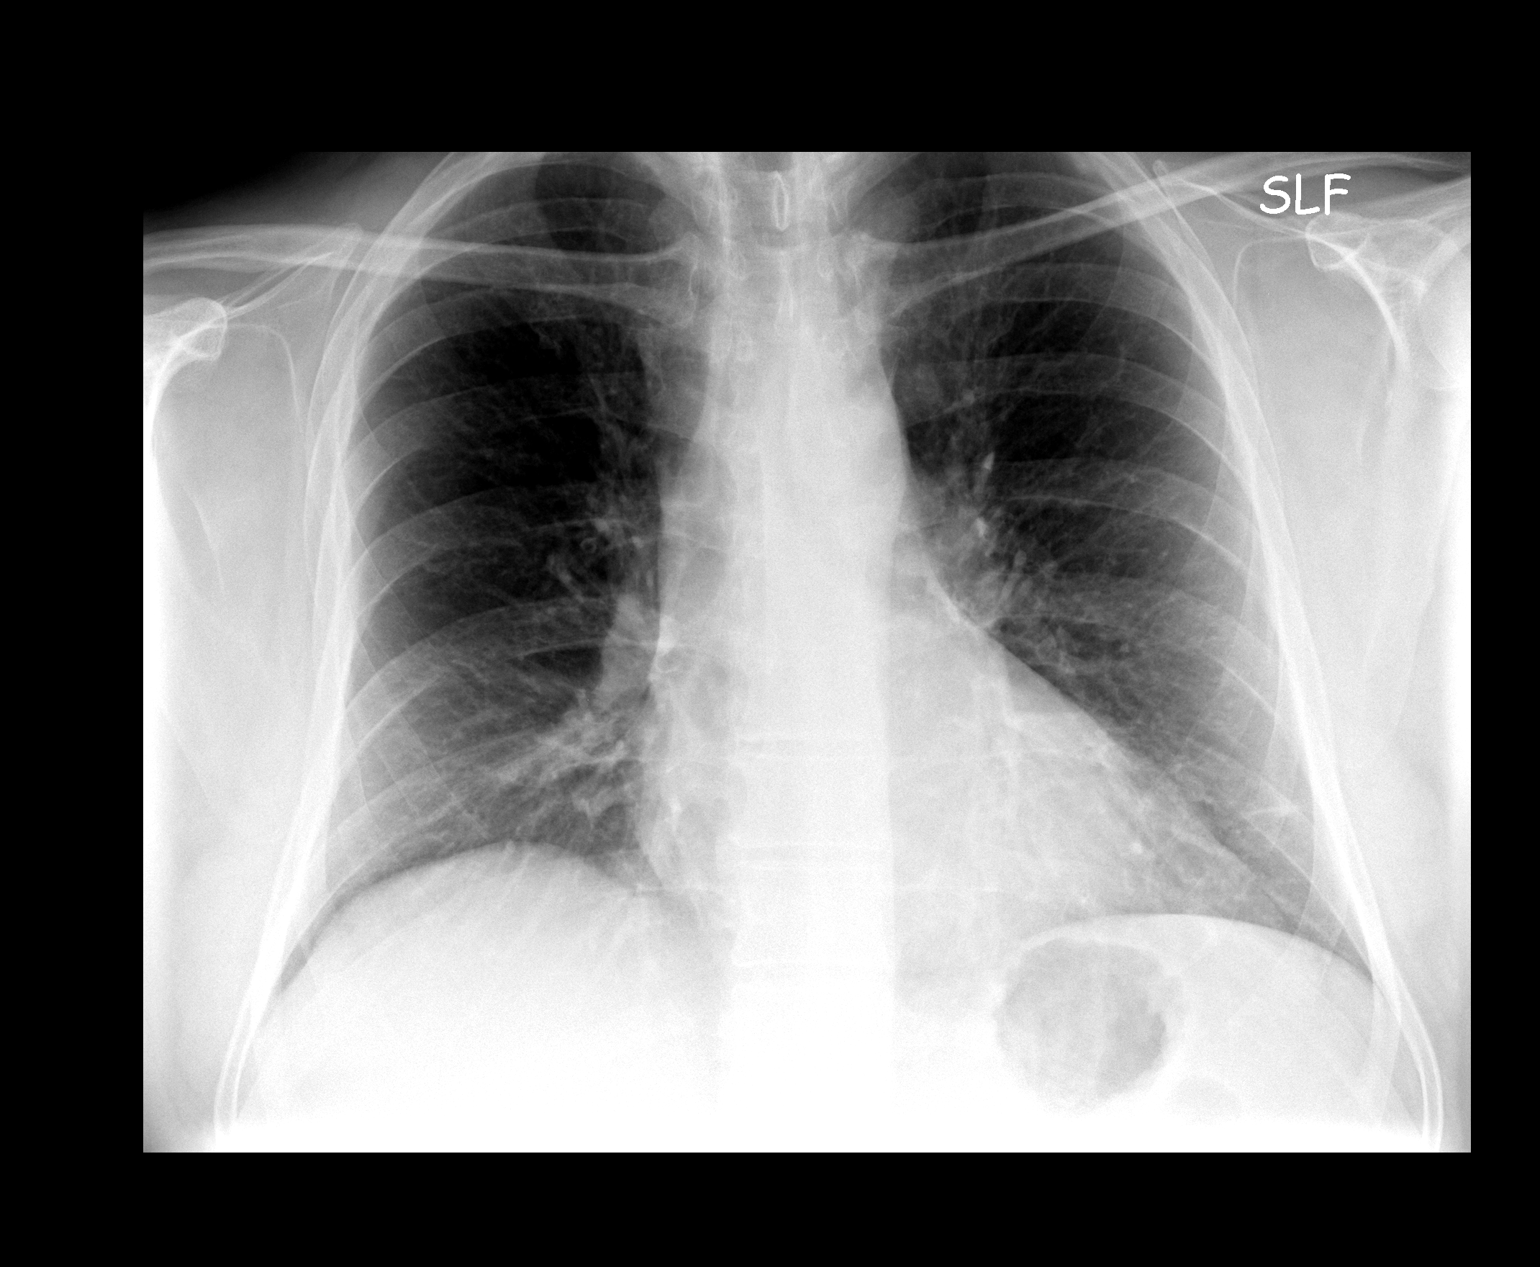

[view not recorded (2 of 2)]
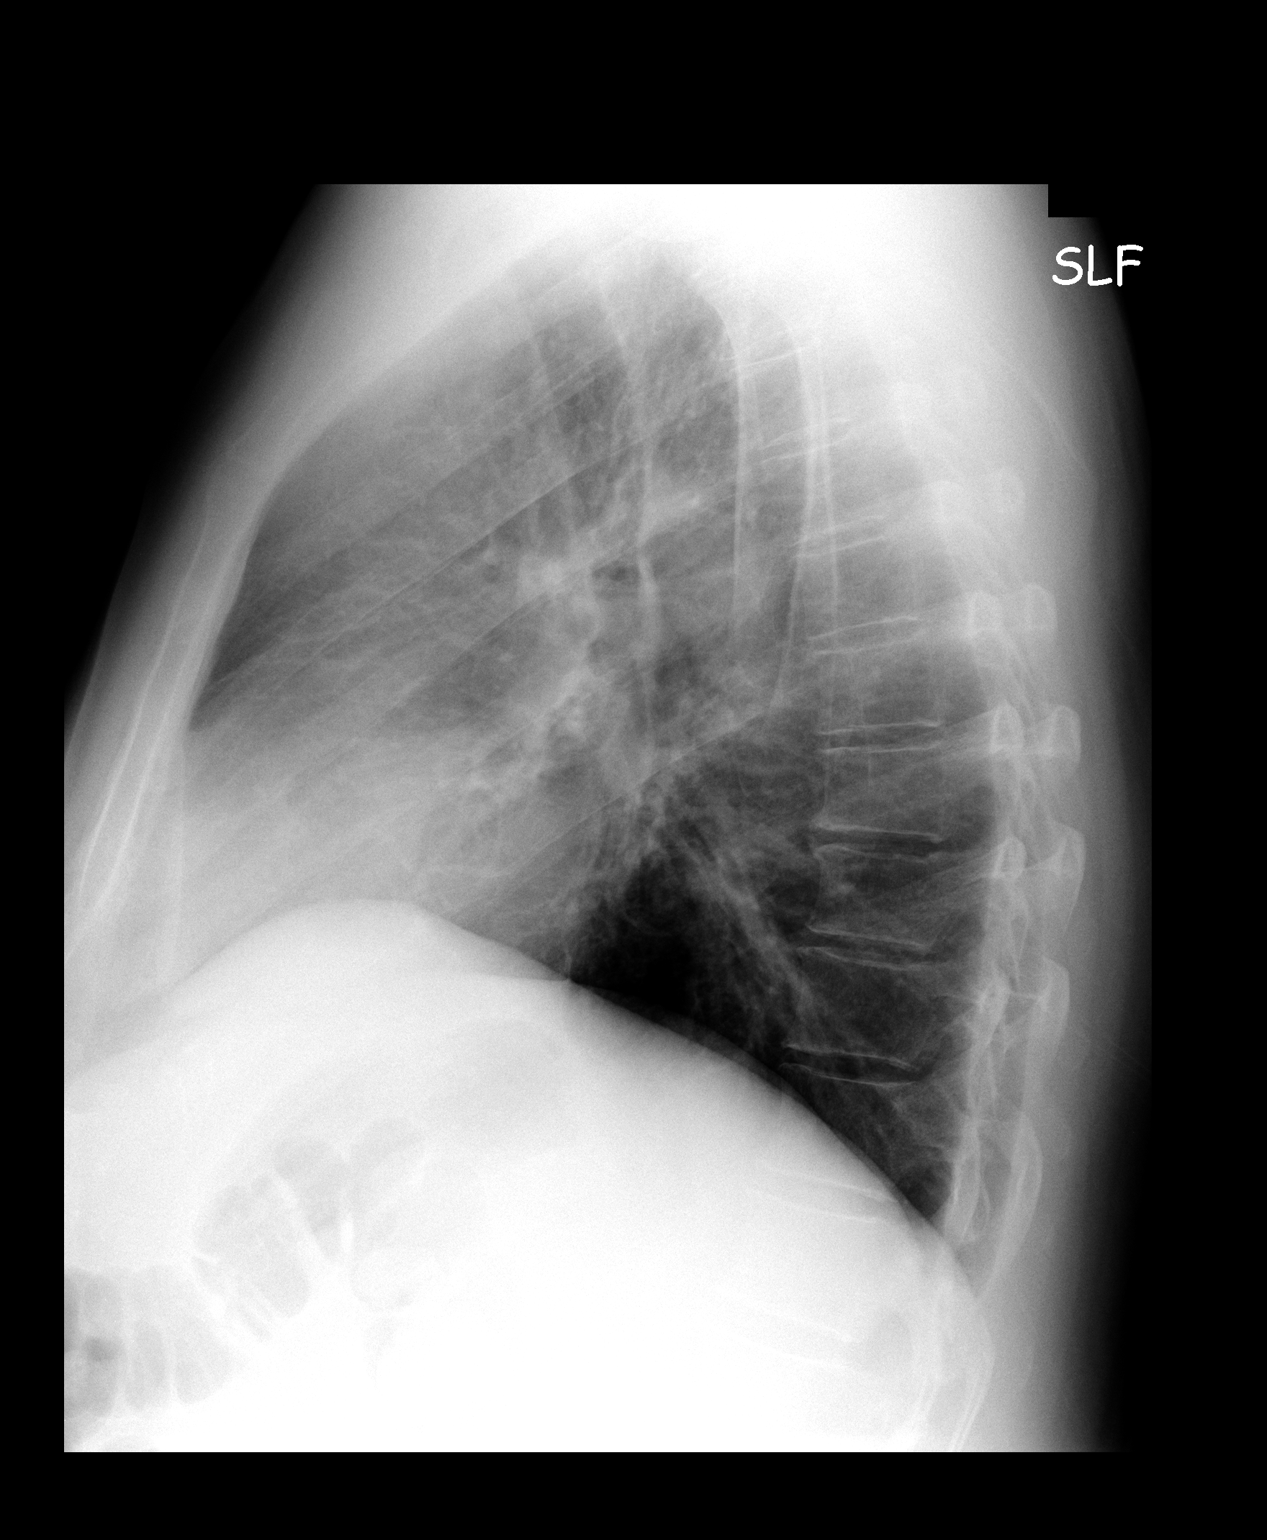

[2 of 2 positions shown; findings below may reference images not displayed]

FINDINGS: Upper-normal size of cardiac silhouette.
Mediastinal contours and pulmonary vascularity normal.
Minimal bronchitic changes and left basilar atelectasis.
Lungs otherwise clear.
No pleural effusion or pneumothorax.
Bones unremarkable.
IMPRESSION: Minimal bronchitic changes and left basilar atelectasis.

## 2011-08-24 MED ORDER — FLUTICASONE PROPIONATE 50 MCG/ACT NA SUSP: 2.0000 | Freq: Every day | NASAL | Status: DC

## 2011-08-24 MED ORDER — HYDROCODONE-HOMATROPINE 5-1.5 MG/5ML PO SYRP: 5.0000 mL | ORAL_SOLUTION | Freq: Four times a day (QID) | ORAL | Status: AC | PRN

## 2011-08-24 MED ORDER — DEXAMETHASONE 4 MG PO TABS: ORAL_TABLET | ORAL | Status: DC

## 2011-08-24 MED ORDER — ALBUTEROL SULFATE HFA 108 (90 BASE) MCG/ACT IN AERS: 1.0000 | INHALATION_SPRAY | Freq: Four times a day (QID) | RESPIRATORY_TRACT | Status: DC | PRN

## 2011-08-24 MED ORDER — IPRATROPIUM-ALBUTEROL 0.5-2.5 (3) MG/3ML IN SOLN: 3.0000 mL | RESPIRATORY_TRACT | Status: DC

## 2011-08-24 MED ORDER — ALBUTEROL SULFATE (5 MG/ML) 0.5% IN NEBU
INHALATION_SOLUTION | RESPIRATORY_TRACT | Status: AC
  Filled 2011-08-24: qty 1

## 2011-08-24 NOTE — ED Notes (Signed)
C/o has felt bad for past 2 days w body aches, chills, seats, green secretions; chest clear to ascultation; NAD, looks sick

## 2011-08-24 NOTE — ED Provider Notes (Signed)
History     CSN: [id number] Arrival date & time: 08/24/2011 11:57 AM   First MD Initiated Contact with Patient 08/24/11 1006      Chief Complaint  Patient presents with  . URI    HPI Comments: Pt with 2 days rhinorrhea, nasal congestion, postnasal drip, ST, nonproductive cough, chest congestion, bodyaches. Reports feeling "hot" and chills but no documented fevers. Hears wheezing at night. Some SOB. Unable to sleep at night secondary to coughing. No N/V, ear pain, abd pain, CP, rash. Taking mucinex DM, robitussin D advil 800 mg last dose >8hrs ago w/o relief. No h/o asthma, bronchitis, sinusitis.   Patient is a 55 y.o. male presenting with URI. The history is provided by the patient.  URI The primary symptoms include fatigue, sore throat, cough, wheezing and myalgias. Primary symptoms do not include ear pain, abdominal pain or rash. The current episode started 2 days ago.  The sore throat is not accompanied by trouble swallowing.  Symptoms associated with the illness include chills, congestion and rhinorrhea. The illness is not associated with plugged ear sensation, facial pain or sinus pressure. The following treatments were addressed: A decongestant was ineffective.    Past Medical History  Diagnosis Date  . Kidney stone     Past Surgical History  Procedure Date  . Lithotripsy   . Uretral stent     History reviewed. No pertinent family history.  History  Substance Use Topics  . Smoking status: Never Smoker   . Smokeless tobacco: Not on file  . Alcohol Use: Yes     occasional      Review of Systems  Constitutional: Positive for chills and fatigue.  HENT: Positive for congestion, sore throat, rhinorrhea and postnasal drip. Negative for ear pain, sneezing, trouble swallowing, voice change and sinus pressure.   Respiratory: Positive for cough and wheezing. Negative for chest tightness.   Cardiovascular: Negative for chest pain.  Gastrointestinal: Negative for abdominal  pain.  Musculoskeletal: Positive for myalgias.  Skin: Negative for rash.    Allergies  Review of patient's allergies indicates no known allergies.  Home Medications   Current Outpatient Rx  Name Route Sig Dispense Refill  . DEXTROMETHORPHAN-GUAIFENESIN 30-600 MG PO TB12 Oral Take 1 tablet by mouth every 12 (twelve) hours.      . IBUPROFEN 800 MG PO TABS Oral Take 800 mg by mouth every 8 (eight) hours as needed.      . ALBUTEROL SULFATE HFA 108 (90 BASE) MCG/ACT IN AERS Inhalation Inhale 1-2 puffs into the lungs every 6 (six) hours as needed for wheezing. 1 Inhaler 0  . DEXAMETHASONE 4 MG PO TABS  4 tabs po at once on day one, 4 tabs po at once on day 2 8 tablet 0  . FLUTICASONE PROPIONATE 50 MCG/ACT NA SUSP Nasal Place 2 sprays into the nose daily. 16 g 0  . HYDROCODONE-HOMATROPINE 5-1.5 MG/5ML PO SYRP Oral Take 5 mLs by mouth every 6 (six) hours as needed for cough or pain. 120 mL 0    BP 132/67  Pulse 110  Temp(Src) 98.3 F (36.8 C) (Oral)  Resp 20  SpO2 97%  Physical Exam  Nursing note and vitals reviewed. Constitutional: He is oriented to person, place, and time. He appears well-developed and well-nourished.  HENT:  Head: Normocephalic and atraumatic.  Right Ear: Tympanic membrane normal.  Left Ear: Tympanic membrane normal.  Nose: Mucosal edema and rhinorrhea present. Right sinus exhibits no maxillary sinus tenderness and no frontal sinus tenderness.  Left sinus exhibits no maxillary sinus tenderness and no frontal sinus tenderness.  Mouth/Throat: Uvula is midline and mucous membranes are normal. Posterior oropharyngeal erythema present. No oropharyngeal exudate or posterior oropharyngeal edema.  Eyes: Conjunctivae and EOM are normal. Pupils are equal, round, and reactive to light.  Neck: Normal range of motion. Neck supple.  Cardiovascular: Normal rate, regular rhythm and normal heart sounds.   Pulmonary/Chest: Effort normal. No respiratory distress. He has wheezes. He has  no rales. He exhibits no tenderness.       Coughing, scattered wheezing upper fields. poor air movement.   Abdominal: Soft. He exhibits no distension.  Musculoskeletal: Normal range of motion.  Lymphadenopathy:    He has no cervical adenopathy.  Neurological: He is alert and oriented to person, place, and time.  Skin: Skin is warm and dry.  Psychiatric: He has a normal mood and affect. His behavior is normal.    ED Course  Procedures (including critical care time) No information on file. Labs Reviewed - No data to display Dg Chest 2 View  08/24/2011  *RADIOLOGY REPORT*  Clinical Data: Cough, congestion, fever  CHEST - 2 VIEW  Comparison: None  Findings: Upper-normal size of cardiac silhouette. Mediastinal contours and pulmonary vascularity normal. Minimal bronchitic changes and left basilar atelectasis. Lungs otherwise clear. No pleural effusion or pneumothorax. Bones unremarkable.  IMPRESSION: Minimal bronchitic changes and left basilar atelectasis.  Original Report Authenticated By: Lollie Marrow, M.D.     1. Acute bronchitis, viral     Dg Chest 2 View  08/24/2011  *RADIOLOGY REPORT*  Clinical Data: Cough, congestion, fever  CHEST - 2 VIEW  Comparison: None  Findings: Upper-normal size of cardiac silhouette. Mediastinal contours and pulmonary vascularity normal. Minimal bronchitic changes and left basilar atelectasis. Lungs otherwise clear. No pleural effusion or pneumothorax. Bones unremarkable.  IMPRESSION: Minimal bronchitic changes and left basilar atelectasis.  Original Report Authenticated By: Lollie Marrow, M.D.    MDM  Pt seen and examined. Pt speaking in full sentences but has focal wheezing , Pt given albuterol/atrovent. Will do CXR to r/o PNA. Otherwise txing as bronchitis/URI. Will re-evaluate.   Imaging reviewed.  On re-evaluation, pt comfortable, VSS, no complaints.   Discussed imaging with patient Emphasized importance of f/u. Pt agrees.   Luiz Blare,  MD 08/24/11 [phone]

## 2011-08-26 ENCOUNTER — Other Ambulatory Visit: Payer: Self-pay

## 2011-08-26 ENCOUNTER — Inpatient Hospital Stay (HOSPITAL_COMMUNITY)
Admission: EM | Admit: 2011-08-26 | Discharge: 2011-09-01 | DRG: 193 | Disposition: A | Discharge status: Home / Self Care | Payer: Medicaid Other | Attending: Internal Medicine | Admitting: Internal Medicine

## 2011-08-26 ENCOUNTER — Encounter (HOSPITAL_COMMUNITY): Payer: Self-pay | Admitting: *Deleted

## 2011-08-26 ENCOUNTER — Emergency Department (HOSPITAL_COMMUNITY): Payer: Medicaid Other

## 2011-08-26 DIAGNOSIS — Z87442 Personal history of urinary calculi: Secondary | ICD-10-CM

## 2011-08-26 DIAGNOSIS — J09X2 Influenza due to identified novel influenza A virus with other respiratory manifestations: Secondary | ICD-10-CM | POA: Diagnosis present

## 2011-08-26 DIAGNOSIS — J09X1 Influenza due to identified novel influenza A virus with pneumonia: Secondary | ICD-10-CM | POA: Diagnosis present

## 2011-08-26 DIAGNOSIS — R739 Hyperglycemia, unspecified: Secondary | ICD-10-CM

## 2011-08-26 DIAGNOSIS — J9600 Acute respiratory failure, unspecified whether with hypoxia or hypercapnia: Secondary | ICD-10-CM | POA: Diagnosis present

## 2011-08-26 DIAGNOSIS — R0902 Hypoxemia: Secondary | ICD-10-CM

## 2011-08-26 DIAGNOSIS — F17200 Nicotine dependence, unspecified, uncomplicated: Secondary | ICD-10-CM | POA: Diagnosis present

## 2011-08-26 DIAGNOSIS — I10 Essential (primary) hypertension: Secondary | ICD-10-CM | POA: Diagnosis present

## 2011-08-26 DIAGNOSIS — E876 Hypokalemia: Secondary | ICD-10-CM | POA: Diagnosis present

## 2011-08-26 DIAGNOSIS — E871 Hypo-osmolality and hyponatremia: Secondary | ICD-10-CM

## 2011-08-26 DIAGNOSIS — J189 Pneumonia, unspecified organism: Principal | ICD-10-CM | POA: Diagnosis present

## 2011-08-26 DIAGNOSIS — E119 Type 2 diabetes mellitus without complications: Secondary | ICD-10-CM | POA: Diagnosis present

## 2011-08-26 DIAGNOSIS — R0789 Other chest pain: Secondary | ICD-10-CM | POA: Diagnosis present

## 2011-08-26 DIAGNOSIS — Z794 Long term (current) use of insulin: Secondary | ICD-10-CM

## 2011-08-26 LAB — CBC
Hemoglobin: 14.5 g/dL (ref 13.0–17.0)
MCH: 31.5 pg (ref 26.0–34.0)
MCHC: 35.7 g/dL (ref 30.0–36.0)
RDW: 13 % (ref 11.5–15.5)

## 2011-08-26 LAB — DIFFERENTIAL
Basophils Absolute: 0 10*3/uL (ref 0.0–0.1)
Basophils Relative: 0 % (ref 0–1)
Eosinophils Absolute: 0 10*3/uL (ref 0.0–0.7)
Monocytes Absolute: 2.1 10*3/uL — ABNORMAL HIGH (ref 0.1–1.0)
Neutro Abs: 13.5 10*3/uL — ABNORMAL HIGH (ref 1.7–7.7)
Neutrophils Relative %: 78 % — ABNORMAL HIGH (ref 43–77)

## 2011-08-26 LAB — BASIC METABOLIC PANEL
CO2: 24 mEq/L (ref 19–32)
Calcium: 10 mg/dL (ref 8.4–10.5)
Creatinine, Ser: 0.74 mg/dL (ref 0.50–1.35)
GFR calc non Af Amer: >90 mL/min (ref >90)
Glucose, Bld: 556 mg/dL (ref 70–99)

## 2011-08-26 LAB — D-DIMER, QUANTITATIVE: D-Dimer, Quant: 1.26 ug/mL-FEU — ABNORMAL HIGH (ref 0.00–0.48)

## 2011-08-26 LAB — BLOOD GAS, ARTERIAL
Drawn by: 347861
O2 Content: 5 L/min
pCO2 arterial: 36.3 mmHg (ref 35.0–45.0)
pH, Arterial: 7.481 — ABNORMAL HIGH (ref 7.350–7.450)

## 2011-08-26 IMAGING — CT CT ANGIO CHEST
1 of 2 series · 19 of 32 positions shown · IV contrast (APPLIED)
Comparison: Chest radiograph [DATE]

CLINICAL DATA: Hypoxia and tachycardia are

CT ANGIOGRAPHY CHEST WITH CONTRAST
TECHNIQUE: Multidetector CT imaging of the chest was performed
using the standard protocol during bolus administration of
intravenous contrast.  Multiplanar CT image reconstructions
including MIPs were obtained to evaluate the vascular anatomy.
Contrast: 80mL OMNIPAQUE IOHEXOL 300 MG/ML IV SOLN

[Series 12: thins for pacs · axial · 0.74mm/px · z∈[+1420,+1672]mm · 19 of 279 slices shown]
[im 14/279  lung]
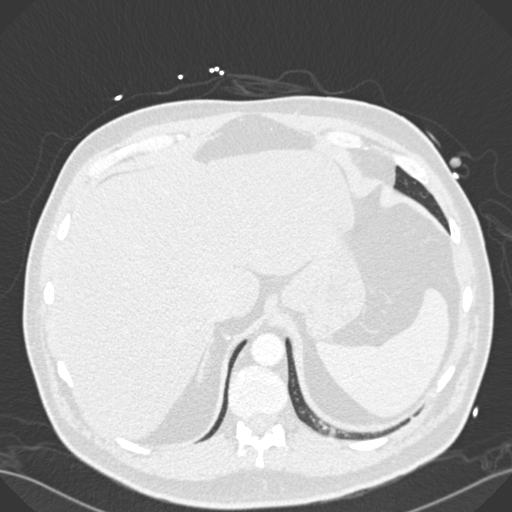
[im 28/279  mediastinal]
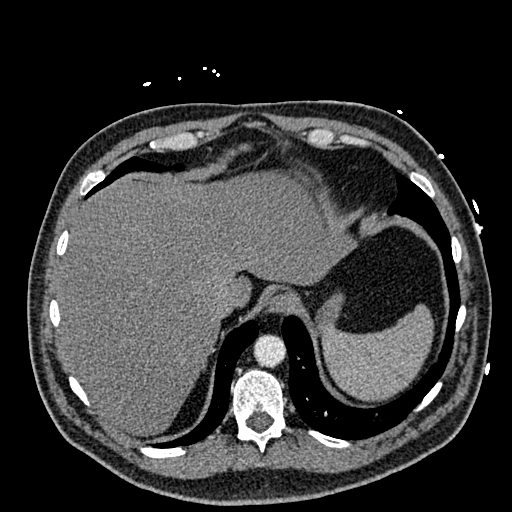
[im 42/279  lung]
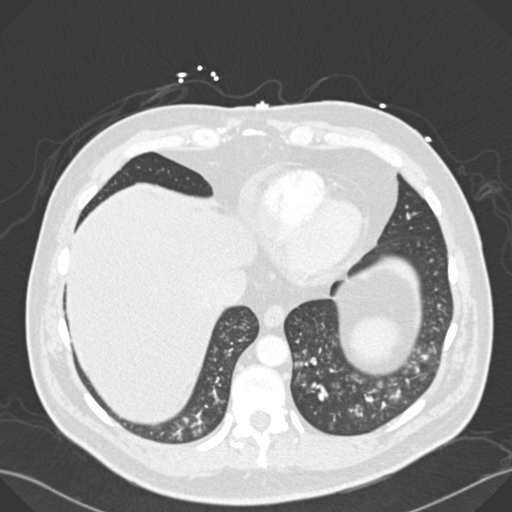
[im 70/279  mediastinal]
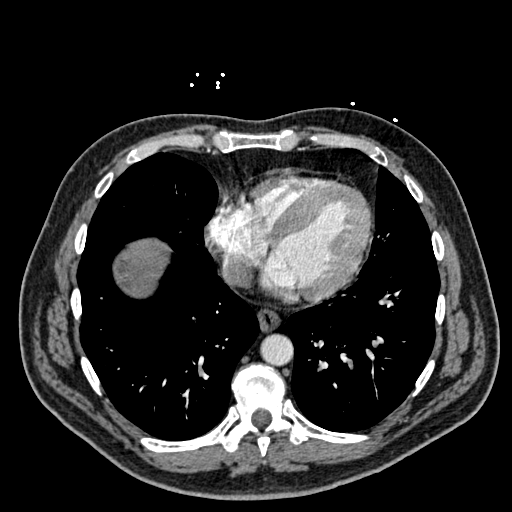
[im 84/279  lung]
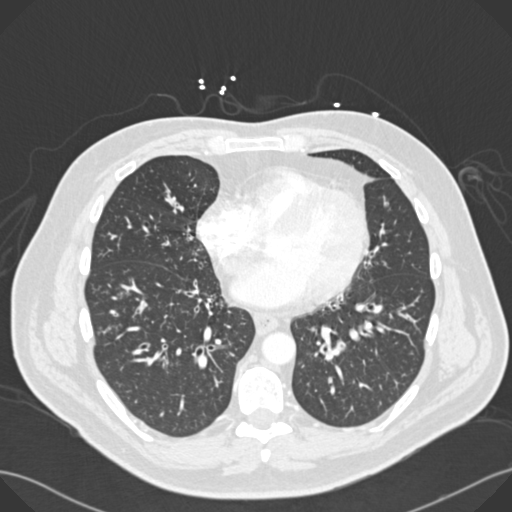
[im 93/279  mediastinal]
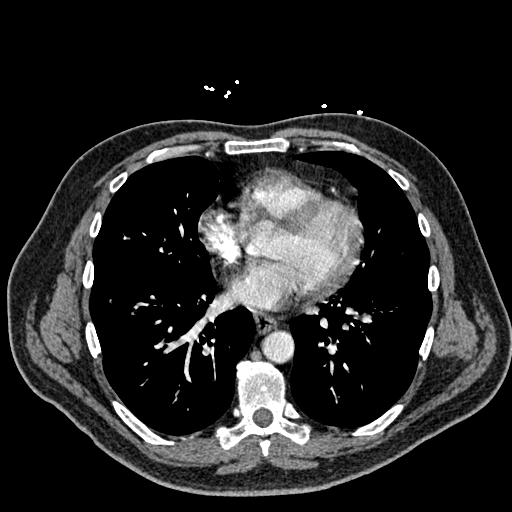
[im 98/279  lung]
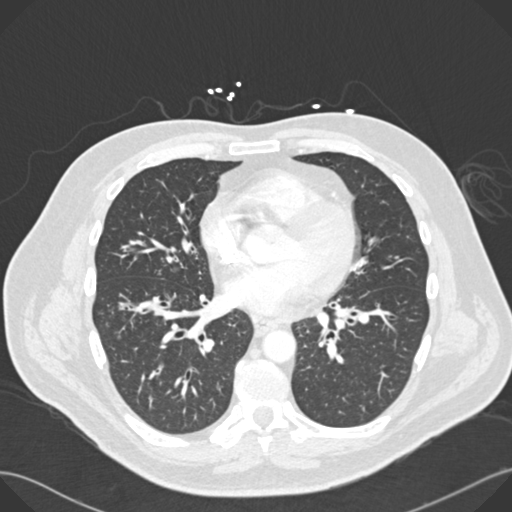
[im 112/279  mediastinal]
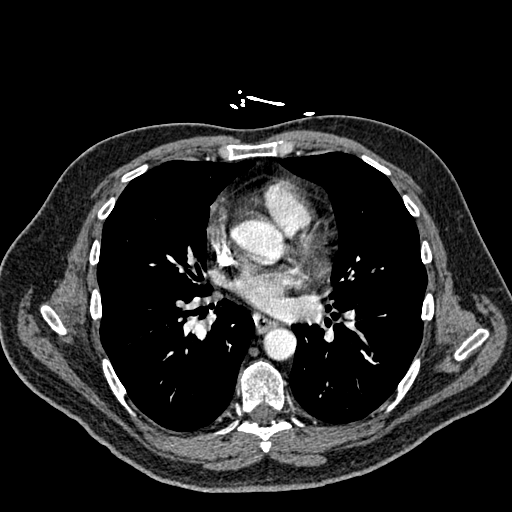
[im 126/279  lung]
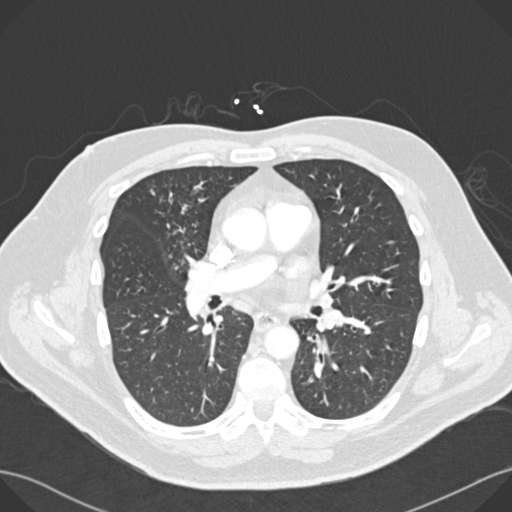
[im 140/279  mediastinal]
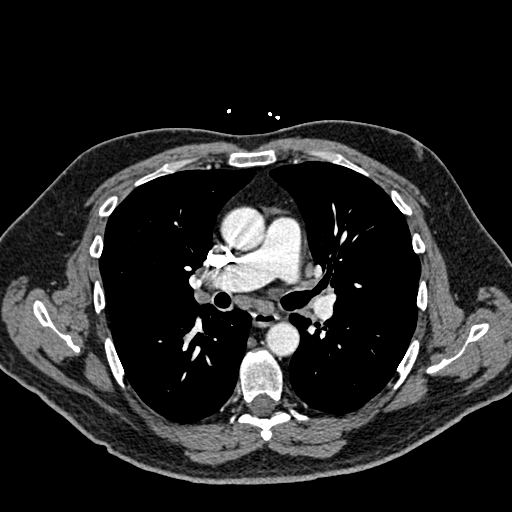
[im 153/279  lung]
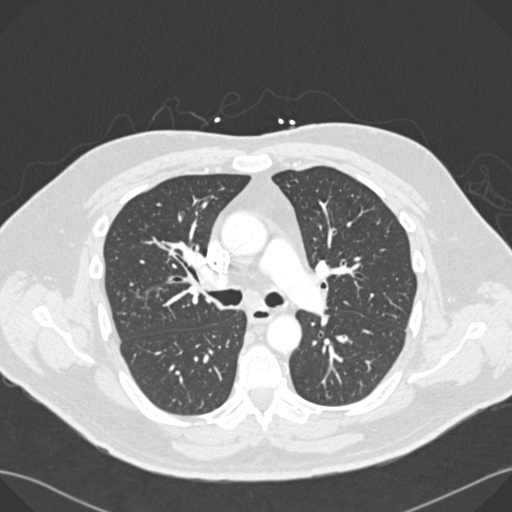
[im 167/279  mediastinal]
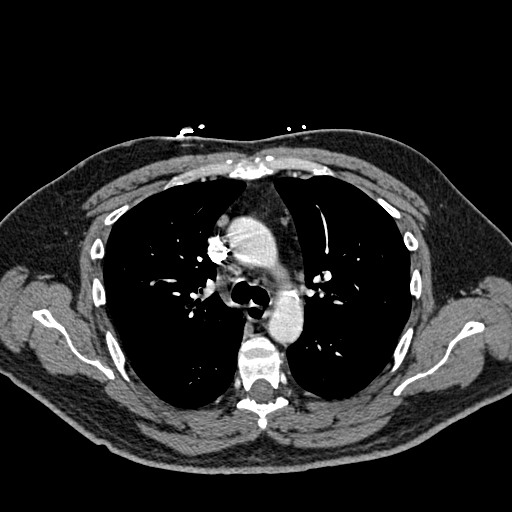
[im 181/279  lung]
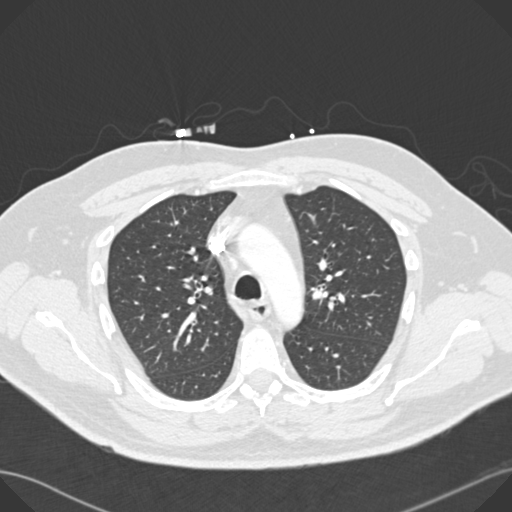
[im 186/279  mediastinal]
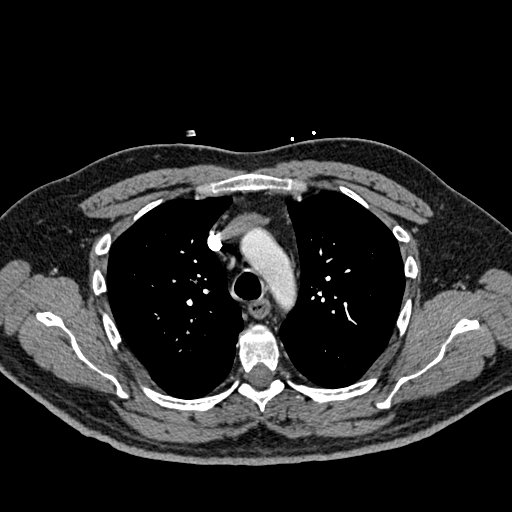
[im 195/279  lung]
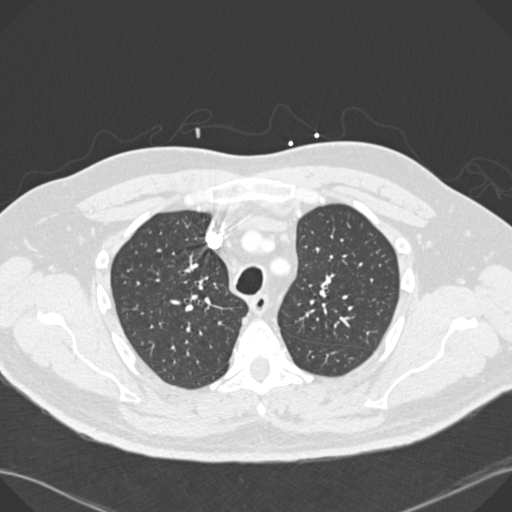
[im 209/279  mediastinal]
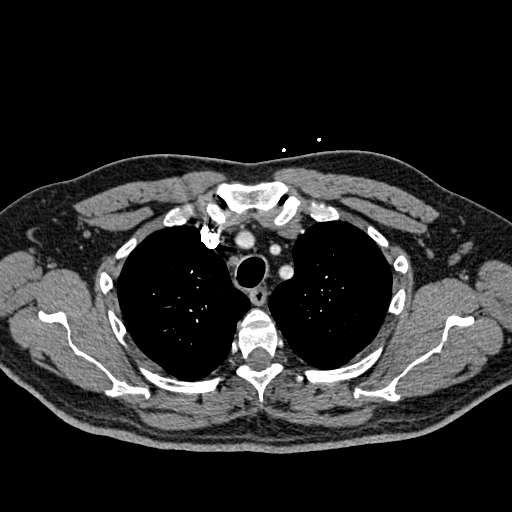
[im 237/279  lung]
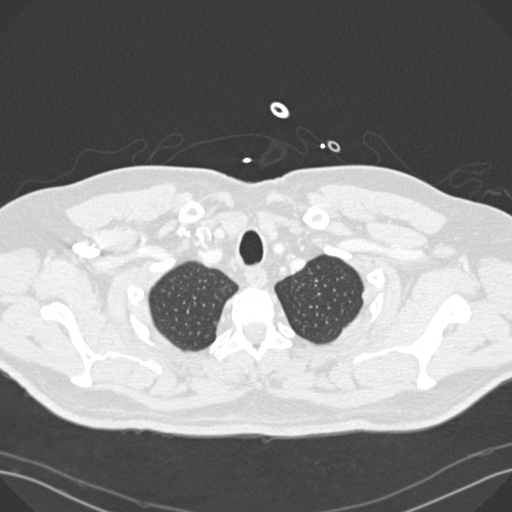
[im 251/279  mediastinal]
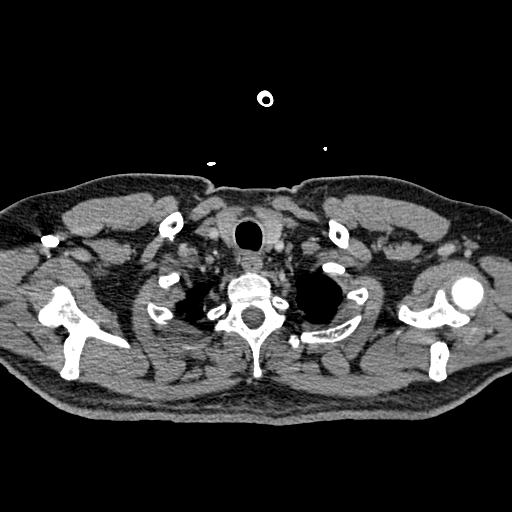
[im 265/279  lung]
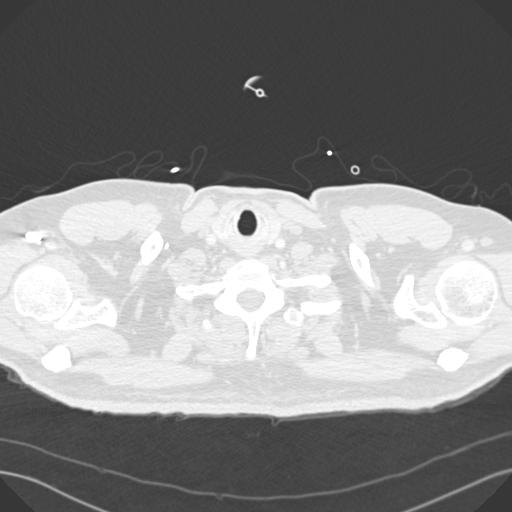

[19 of 32 positions shown; findings below may reference images not displayed]

FINDINGS: Technically satisfactory evaluation of the pulmonary
arteries., no focal filling defect is identified within the
pulmonary arteries to suggest pulmonary embolism.  The thoracic
aorta is normal in caliber and enhancement.  Negative for
dissection.

Heart size is within normal limits.  There is left ventricular wall
thickening.  Faint coronary artery atherosclerotic calcifications
noted.

Negative for pleural or pericardial effusion.  There are scattered
small mediastinal lymph nodes.  No lymphadenopathy is identified in
the chest.

Diffuse bilateral bronchial wall thickening and mild
bronchiectasis, with "tree-in-bud" micronodularity in the right
middle lobe, right lower lobe and left lower lobe.  Findings
suggest an atypical or fungal infection, as often seen with
Mycobacterium avium intracellulare.  No focal airspace disease or
pneumothorax.

Visualized thoracic spine vertebral bodies are normal in height and
alignment.  A small meningioma noted in the T6 vertebral body.  No
acute fracture is identified.

Fatty infiltration of the liver is noted.

Review of the MIP images confirms the above findings.
IMPRESSION: 1.  Negative for pulmonary embolism.
2.  Diffuse bronchial wall thickening and mild bronchiectasis with
extensive"tree-in-bud" micronodularity of the right middle lobe and
both lower lobes, suggestive of fungal or atypical infection, and
typically seen with Mycobacterium avium intracellulare.
3.  Fatty infiltration of the liver.

## 2011-08-26 IMAGING — CR DG CHEST 2V
2 series · 2 of 2 positions shown · non-contrast
Comparison: Chest radiograph performed [DATE]

CLINICAL DATA: Cough and generalized chest pain; hypoxia.  History
of smoking.

CHEST - 2 VIEW

[w chest pa]
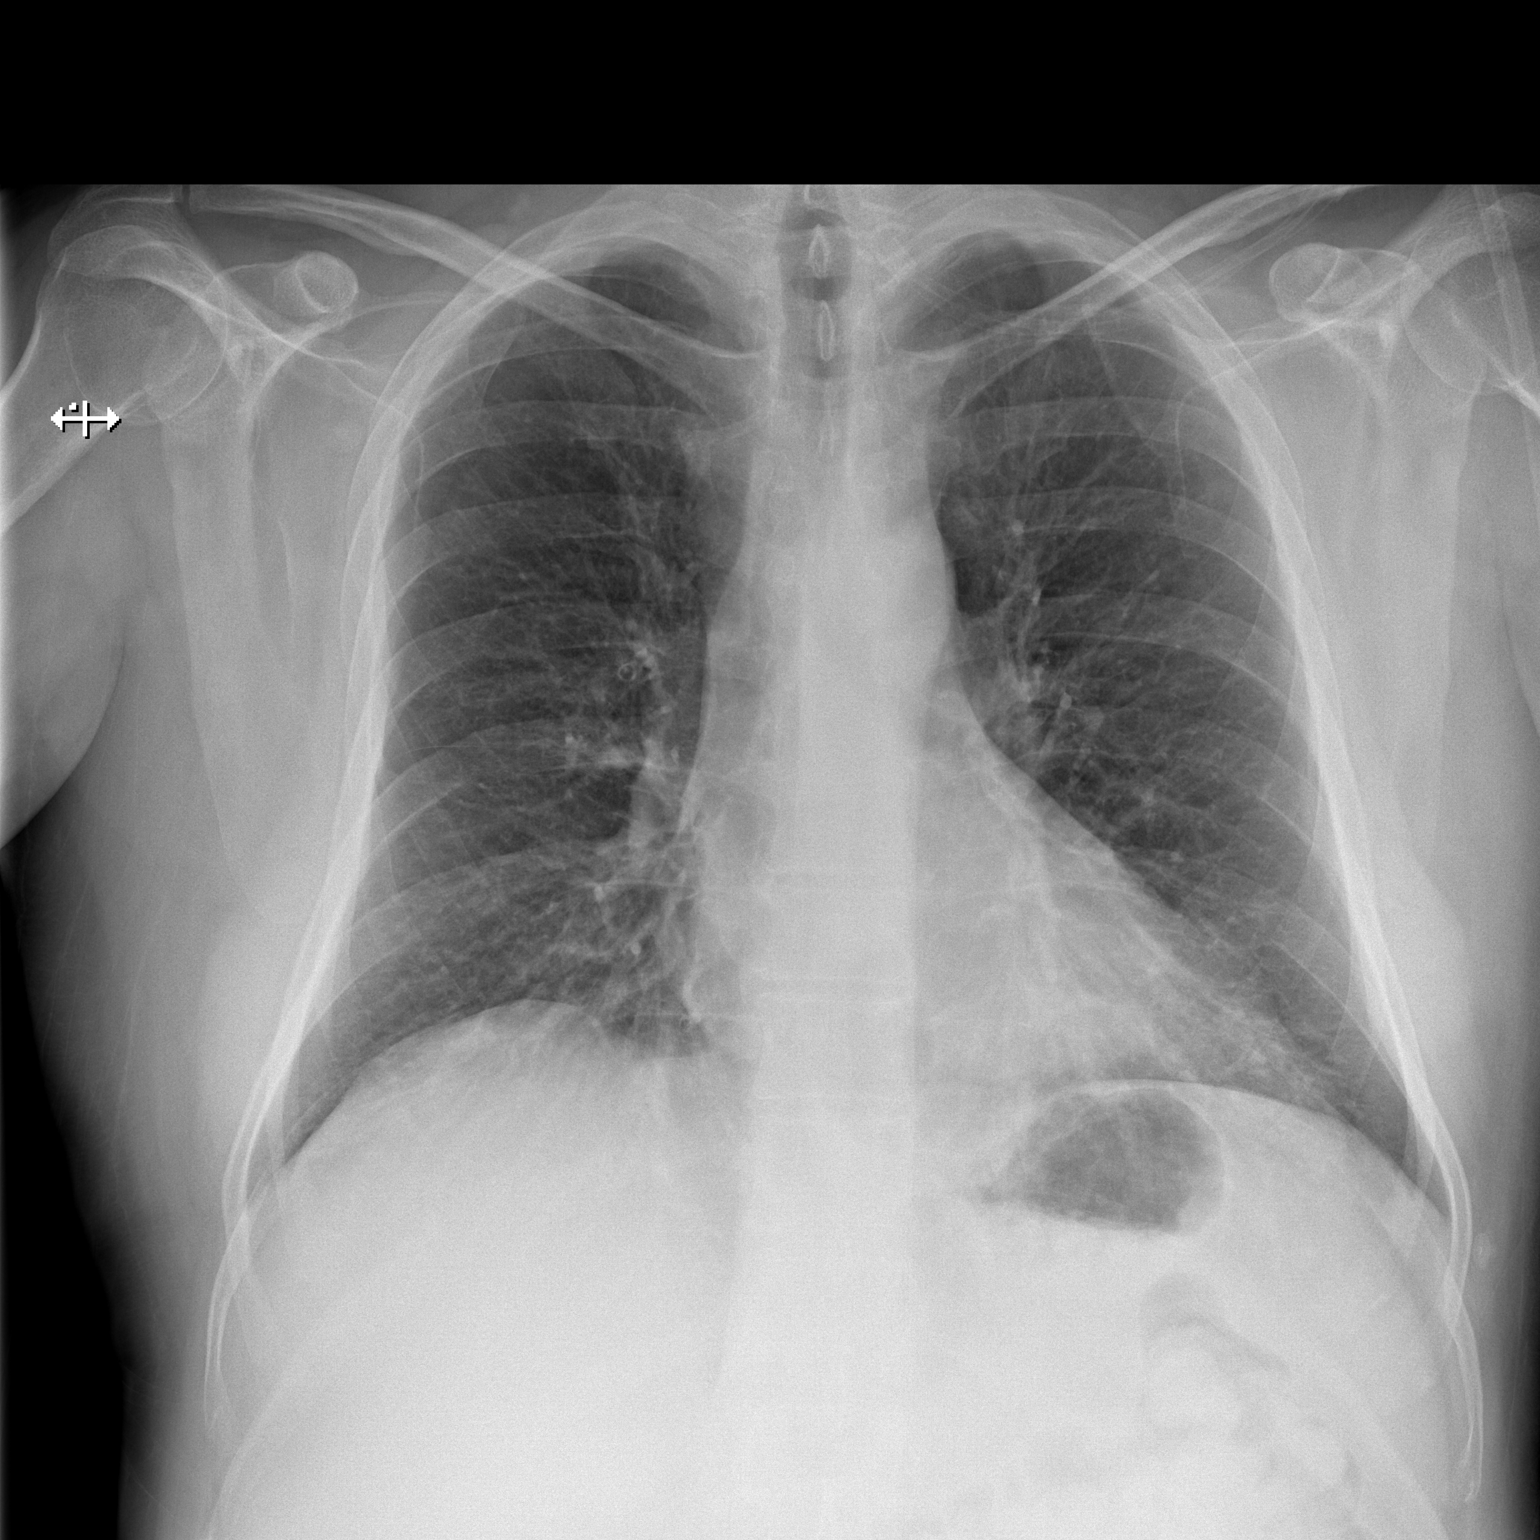

[w chest lat]
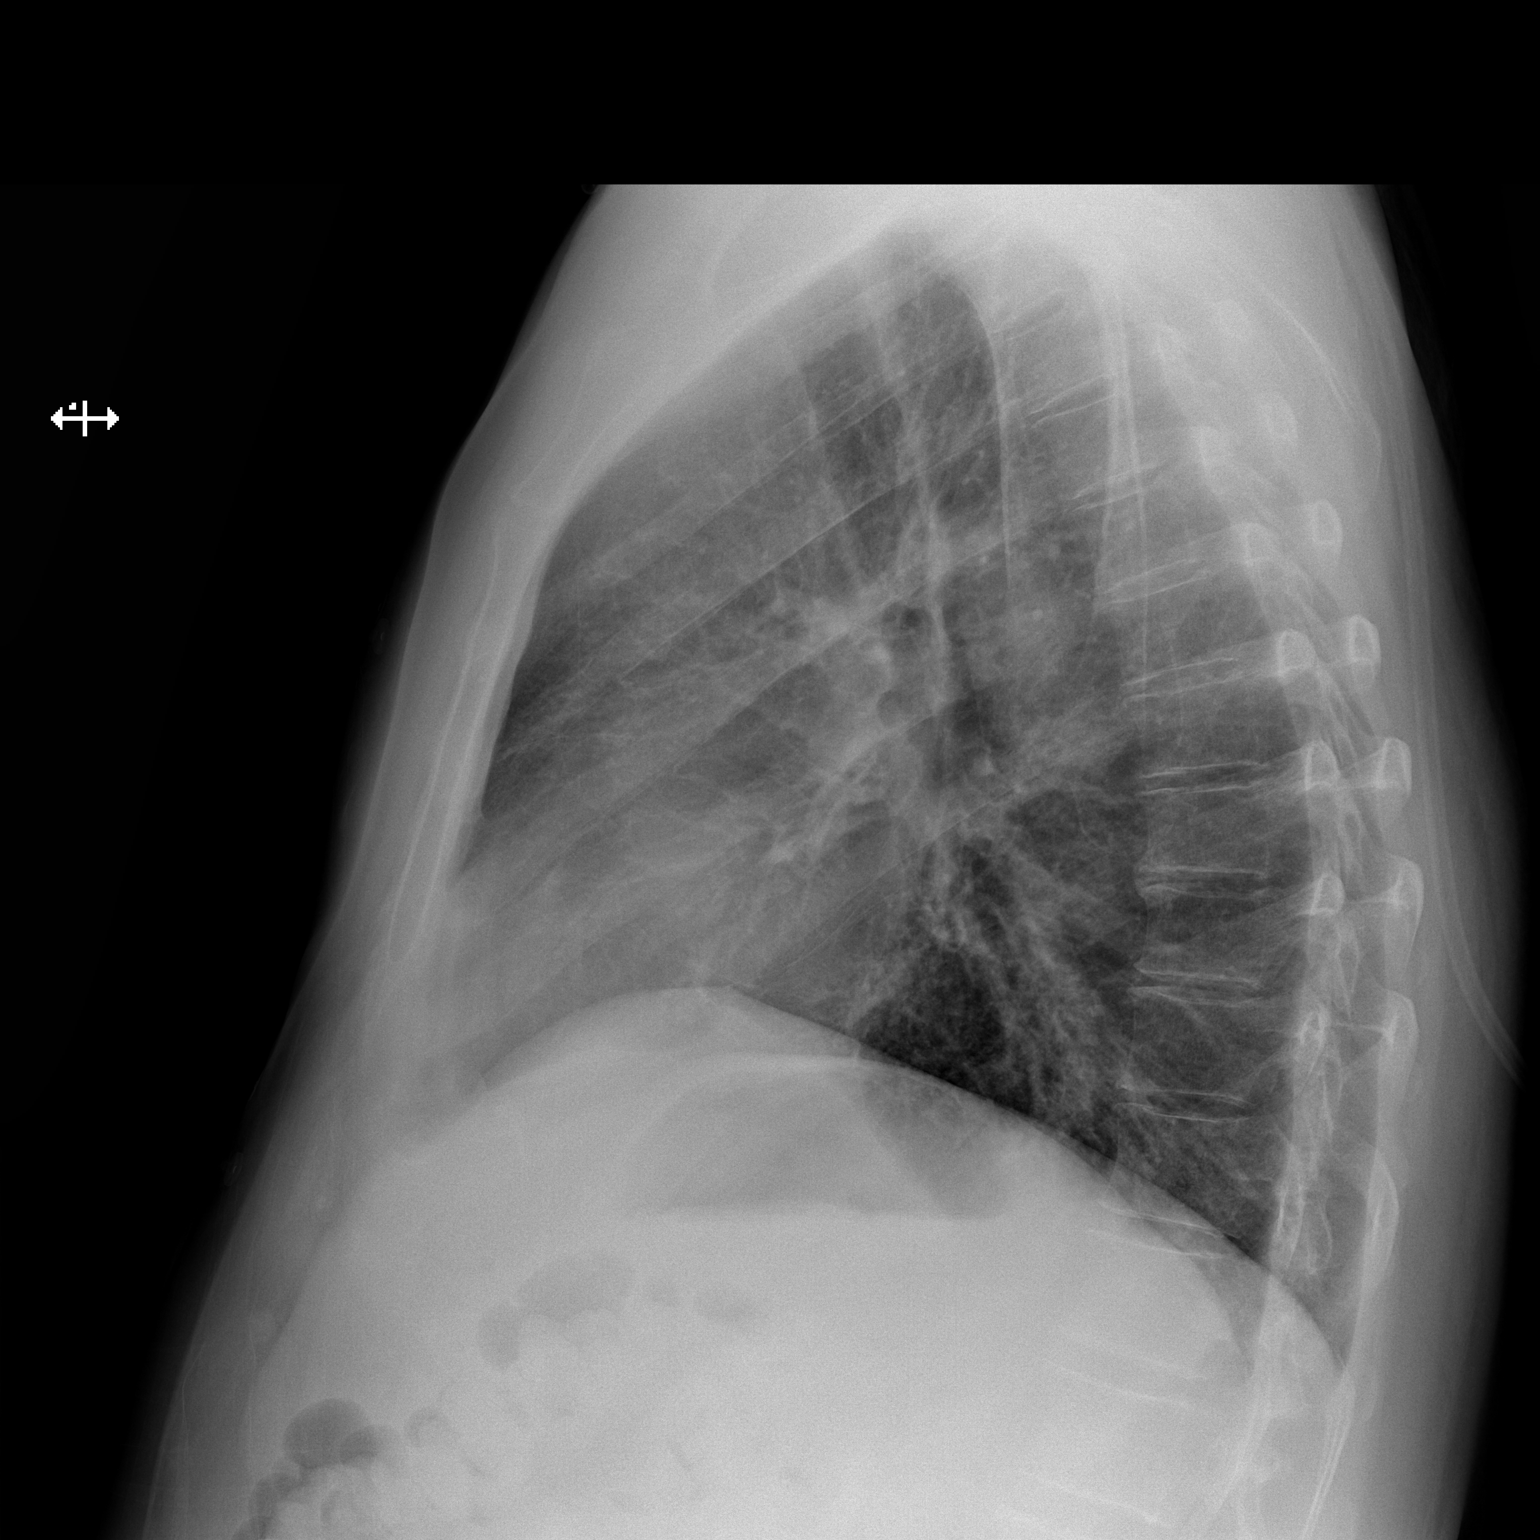

[2 of 2 positions shown; findings below may reference images not displayed]

FINDINGS: The lungs are well-aerated.  Mild peribronchial
thickening is noted. Persistent mild left basilar airspace opacity
may reflect atelectasis or possibly mild pneumonia; this appears
slightly worsened from the prior study.  There is no evidence of
pleural effusion or pneumothorax.

The heart is normal in size; the mediastinal contour is within
normal limits.  No acute osseous abnormalities are seen.
IMPRESSION: Slightly worsened mild left basilar airspace opacity may reflect
atelectasis or possibly mild pneumonia.

## 2011-08-26 MED ORDER — IOHEXOL 300 MG/ML  SOLN
100.0000 mL | Freq: Once | INTRAMUSCULAR | Status: AC | PRN
  Administered 2011-08-26: 80 mL via INTRAVENOUS

## 2011-08-26 MED ORDER — DEXTROSE 5 % IV SOLN
500.0000 mg | Freq: Once | INTRAVENOUS | Status: AC
  Administered 2011-08-27: 500 mg via INTRAVENOUS
  Filled 2011-08-26: qty 500

## 2011-08-26 MED ORDER — SODIUM CHLORIDE 0.9 % IV SOLN
INTRAVENOUS | Status: DC
  Administered 2011-08-28: 19:00:00 via INTRAVENOUS

## 2011-08-26 MED ORDER — METHYLPREDNISOLONE SODIUM SUCC 125 MG IJ SOLR
125.0000 mg | Freq: Once | INTRAMUSCULAR | Status: AC
  Administered 2011-08-26: 125 mg via INTRAVENOUS
  Filled 2011-08-26: qty 2

## 2011-08-26 MED ORDER — DEXTROSE 5 % IV SOLN
1.0000 g | Freq: Once | INTRAVENOUS | Status: AC
  Administered 2011-08-26: 1 g via INTRAVENOUS
  Filled 2011-08-26: qty 10

## 2011-08-26 MED ORDER — IPRATROPIUM BROMIDE 0.02 % IN SOLN
0.5000 mg | Freq: Once | RESPIRATORY_TRACT | Status: AC
  Administered 2011-08-26: 0.5 mg via RESPIRATORY_TRACT
  Filled 2011-08-26: qty 2.5

## 2011-08-26 MED ORDER — SODIUM CHLORIDE 0.9 % IV BOLUS (SEPSIS): 1000.0000 mL | Freq: Once | INTRAVENOUS | Status: DC

## 2011-08-26 MED ORDER — SODIUM CHLORIDE 0.9 % IV BOLUS (SEPSIS)
1000.0000 mL | Freq: Once | INTRAVENOUS | Status: AC
  Administered 2011-08-26 (×2): 1000 mL via INTRAVENOUS

## 2011-08-26 MED ORDER — INSULIN REGULAR HUMAN 100 UNIT/ML IJ SOLN: 8.0000 [IU] | Freq: Once | INTRAMUSCULAR | Status: DC

## 2011-08-26 MED ORDER — ALBUTEROL SULFATE (5 MG/ML) 0.5% IN NEBU
2.5000 mg | INHALATION_SOLUTION | Freq: Once | RESPIRATORY_TRACT | Status: AC
  Administered 2011-08-26: 2.5 mg via RESPIRATORY_TRACT
  Filled 2011-08-26: qty 0.5

## 2011-08-26 NOTE — ED Notes (Signed)
[id number]> Expected date:<BR> Expected time:<BR> Means of arrival:<BR> Comments:<BR> Triage 4 hypoxia

## 2011-08-26 NOTE — ED Provider Notes (Signed)
History     CSN: [id number] Arrival date & time: 08/26/2011  9:50 PM   First MD Initiated Contact with Patient 08/26/11 2230      Chief Complaint  Patient presents with  . Shortness of Breath    Pt is hypoxic, placed on O2 @ 4L/min SaO2 =89%-91%  . Dizziness    (Consider location/radiation/quality/duration/timing/severity/associated sxs/prior treatment) HPI Comments: Patient presents with shortness of breath, cough, hypoxia and wheezing. He states he's been sick for about the past week. He was seen in urgent care 2 days ago and diagnosed with bronchitis and sent home on steroids with albuterol. He states he's never really gotten better and his breathing is worse tonight. He has some anterior chest pain with coughing.  Denies any leg pain, swelling, recent travel. He was a smoker 40 years ago. Denies any fever, abdominal pain, nausea or vomiting. Does have a decreased appetite.  The history is provided by the patient.    Past Medical History  Diagnosis Date  . Kidney stone     Past Surgical History  Procedure Date  . Lithotripsy   . Uretral stent     History reviewed. No pertinent family history.  History  Substance Use Topics  . Smoking status: Former Games developer  . Smokeless tobacco: Not on file  . Alcohol Use: Yes     occasional      Review of Systems  Unable to perform ROS: Unstable vital signs  Respiratory: Positive for shortness of breath.     Allergies  Review of patient's allergies indicates no known allergies.  Home Medications   Current Outpatient Rx  Name Route Sig Dispense Refill  . ALBUTEROL SULFATE HFA 108 (90 BASE) MCG/ACT IN AERS Inhalation Inhale 1-2 puffs into the lungs every 6 (six) hours as needed for wheezing. 1 Inhaler 0  . DM-GUAIFENESIN ER 30-600 MG PO TB12 Oral Take 1 tablet by mouth every 12 (twelve) hours.      Marland Kitchen FLUTICASONE PROPIONATE 50 MCG/ACT NA SUSP Nasal Place 2 sprays into the nose daily. 16 g 0  . HYDROCODONE-HOMATROPINE 5-1.5  MG/5ML PO SYRP Oral Take 5 mLs by mouth every 6 (six) hours as needed for cough or pain. 120 mL 0  . IBUPROFEN 200 MG PO TABS Oral Take 600 mg by mouth every 6 (six) hours as needed. For pain.     Carma Leaven M PLUS PO TABS Oral Take 1 tablet by mouth daily.        BP 153/80  Pulse 131  Temp(Src) 98 F (36.7 C) (Oral)  Resp 30  SpO2 97%  Physical Exam  Constitutional: He is oriented to person, place, and time. He appears well-developed and well-nourished. He appears distressed.  HENT:  Head: Normocephalic and atraumatic.  Mouth/Throat: Oropharynx is clear and moist.  Eyes: Conjunctivae are normal. Pupils are equal, round, and reactive to light.  Neck: Normal range of motion. Neck supple.  Cardiovascular: Normal rate and regular rhythm.   Pulmonary/Chest: He is in respiratory distress. He has wheezes.       Tachypneic, hypoxic, labored breathing with scattered wheezing  Abdominal: Soft. There is no tenderness. There is no rebound and no guarding.  Musculoskeletal: Normal range of motion. He exhibits no edema and no tenderness.  Neurological: He is alert and oriented to person, place, and time. No cranial nerve deficit.  Skin: Skin is warm.    ED Course  Procedures (including critical care time)  Labs Reviewed  CBC - Abnormal; Notable for the  following:    WBC 17.3 (*)    All other components within normal limits  DIFFERENTIAL - Abnormal; Notable for the following:    Neutrophils Relative 78 (*)    Lymphocytes Relative 10 (*)    Neutro Abs 13.5 (*)    Monocytes Absolute 2.1 (*)    All other components within normal limits  BASIC METABOLIC PANEL - Abnormal; Notable for the following:    Sodium 127 (*)    Potassium 3.4 (*)    Chloride 88 (*)    Glucose, Bld 556 (*)    All other components within normal limits  CARDIAC PANEL(CRET KIN+CKTOT+MB+TROPI) - Abnormal; Notable for the following:    Total CK 913 (*)    CK, MB 8.2 (*)    All other components within normal limits    URINALYSIS, ROUTINE W REFLEX MICROSCOPIC - Abnormal; Notable for the following:    Specific Gravity, Urine 1.045 (*)    Glucose, UA >1000 (*)    Hgb urine dipstick LARGE (*)    Ketones, ur TRACE (*)    All other components within normal limits  BLOOD GAS, ARTERIAL - Abnormal; Notable for the following:    pH, Arterial 7.481 (*)    pO2, Arterial 58.1 (*)    Bicarbonate 26.8 (*)    Acid-Base Excess 3.8 (*)    All other components within normal limits  D-DIMER, QUANTITATIVE - Abnormal; Notable for the following:    D-Dimer, Quant 1.26 (*)    All other components within normal limits  URINE MICROSCOPIC-ADD ON   Dg Chest 2 View  08/26/2011  *RADIOLOGY REPORT*  Clinical Data: Cough and generalized chest pain; hypoxia.  History of smoking.  CHEST - 2 VIEW  Comparison: Chest radiograph performed 08/24/2011  Findings: The lungs are well-aerated.  Mild peribronchial thickening is noted. Persistent mild left basilar airspace opacity may reflect atelectasis or possibly mild pneumonia; this appears slightly worsened from the prior study.  There is no evidence of pleural effusion or pneumothorax.  The heart is normal in size; the mediastinal contour is within normal limits.  No acute osseous abnormalities are seen.  IMPRESSION: Slightly worsened mild left basilar airspace opacity may reflect atelectasis or possibly mild pneumonia.  Original Report Authenticated By: Tonia Ghent, M.D.   Ct Angio Chest W/cm &/or Wo Cm  08/26/2011  *RADIOLOGY REPORT*  Clinical Data:  Hypoxia and tachycardia are  CT ANGIOGRAPHY CHEST WITH CONTRAST  Technique:  Multidetector CT imaging of the chest was performed using the standard protocol during bolus administration of intravenous contrast.  Multiplanar CT image reconstructions including MIPs were obtained to evaluate the vascular anatomy.  Contrast: 80mL OMNIPAQUE IOHEXOL 300 MG/ML IV SOLN  Comparison:  Chest radiograph 08/26/2011  Findings:  Technically satisfactory  evaluation of the pulmonary arteries., no focal filling defect is identified within the pulmonary arteries to suggest pulmonary embolism.  The thoracic aorta is normal in caliber and enhancement.  Negative for dissection.  Heart size is within normal limits.  There is left ventricular wall thickening.  Faint coronary artery atherosclerotic calcifications noted.  Negative for pleural or pericardial effusion.  There are scattered small mediastinal lymph nodes.  No lymphadenopathy is identified in the chest.  Diffuse bilateral bronchial wall thickening and mild bronchiectasis, with "tree-in-bud" micronodularity in the right middle lobe, right lower lobe and left lower lobe.  Findings suggest an atypical or fungal infection, as often seen with Mycobacterium avium intracellulare.  No focal airspace disease or pneumothorax.  Visualized thoracic spine  vertebral bodies are normal in height and alignment.  A small meningioma noted in the T6 vertebral body.  No acute fracture is identified.  Fatty infiltration of the liver is noted.  Review of the MIP images confirms the above findings.  IMPRESSION:  1.  Negative for pulmonary embolism. 2.  Diffuse bronchial wall thickening and mild bronchiectasis with extensive"tree-in-bud" micronodularity of the right middle lobe and both lower lobes, suggestive of fungal or atypical infection, and typically seen with Mycobacterium avium intracellulare. 3.  Fatty infiltration of the liver.  Original Report Authenticated By: Britta Mccreedy, M.D.     1. Hypoxia   2. Hyperglycemia   3. Hyponatremia       MDM  Dyspnea, hypoxia, tachycardia.  Scattered wheezes with rhonchi. Chest x-ray suggestive of interstitial prominence. Patient remains tachycardic and hypoxic. He'll require evaluation for pulmonary embolism.  CT is negative for PE. It is remarkable for diffuse bronchial wall thickening and bronchiectasis.  I discussed these results with pulmonary/critical care.  Despite oxygen  requirement patient remains stable and critical care believes appropriate for hospitalist with pulmonary consult.  Will continue antibiotics for PNA.   Hospitalist and PCCM have discussed case and determined better for pulmonary admit.Marland Kitchen  CRITICAL CARE Performed by: Glynn Octave   Total critical care time: 45  Critical care time was exclusive of separately billable procedures and treating other patients.  Critical care was necessary to treat or prevent imminent or life-threatening deterioration.  Critical care was time spent personally by me on the following activities: development of treatment plan with patient and/or surrogate as well as nursing, discussions with consultants, evaluation of patient's response to treatment, examination of patient, obtaining history from patient or surrogate, ordering and performing treatments and interventions, ordering and review of laboratory studies, ordering and review of radiographic studies, pulse oximetry and re-evaluation of patient's condition.    Date: 08/27/2011  Rate: 127  Rhythm: sinus tachycardia  QRS Axis: normal  Intervals: normal  ST/T Wave abnormalities: nonspecific ST/T changes  Conduction Disutrbances:none  Narrative Interpretation:   Old EKG Reviewed: unchanged    Glynn Octave, MD 08/27/11 [phone]

## 2011-08-26 NOTE — ED Notes (Signed)
Pt c/o Shortness of breath starting past Sunday, seen at [facility] on Wed and dx w/ bronchitis. Pt presents w/ hypoxia, placed on O2 in triage w/ some improvement. Pt has audible wheezing and labored breathing.

## 2011-08-27 ENCOUNTER — Encounter (HOSPITAL_COMMUNITY): Payer: Self-pay | Admitting: Internal Medicine

## 2011-08-27 DIAGNOSIS — J189 Pneumonia, unspecified organism: Secondary | ICD-10-CM

## 2011-08-27 DIAGNOSIS — E119 Type 2 diabetes mellitus without complications: Secondary | ICD-10-CM | POA: Diagnosis present

## 2011-08-27 DIAGNOSIS — J9600 Acute respiratory failure, unspecified whether with hypoxia or hypercapnia: Secondary | ICD-10-CM | POA: Diagnosis present

## 2011-08-27 LAB — BASIC METABOLIC PANEL
BUN: 15 mg/dL (ref 6–23)
Calcium: 9 mg/dL (ref 8.4–10.5)
Creatinine, Ser: 0.68 mg/dL (ref 0.50–1.35)
GFR calc Af Amer: >90 mL/min (ref >90)
GFR calc non Af Amer: >90 mL/min (ref >90)

## 2011-08-27 LAB — BLOOD GAS, ARTERIAL
Acid-Base Excess: 2.4 mmol/L — ABNORMAL HIGH (ref 0.0–2.0)
Bicarbonate: 26.2 mEq/L — ABNORMAL HIGH (ref 20.0–24.0)
O2 Saturation: 94.4 %
pO2, Arterial: 73.9 mmHg — ABNORMAL LOW (ref 80.0–100.0)

## 2011-08-27 LAB — GLUCOSE, CAPILLARY
Glucose-Capillary: 297 mg/dL — ABNORMAL HIGH (ref 70–99)
Glucose-Capillary: 275 mg/dL — ABNORMAL HIGH (ref 70–99)
Glucose-Capillary: 121 mg/dL — ABNORMAL HIGH (ref 70–99)
Glucose-Capillary: 476 mg/dL — ABNORMAL HIGH (ref 70–99)
Glucose-Capillary: 234 mg/dL — ABNORMAL HIGH (ref 70–99)
Glucose-Capillary: 272 mg/dL — ABNORMAL HIGH (ref 70–99)
Glucose-Capillary: 283 mg/dL — ABNORMAL HIGH (ref 70–99)
Glucose-Capillary: 202 mg/dL — ABNORMAL HIGH (ref 70–99)

## 2011-08-27 LAB — URINALYSIS, ROUTINE W REFLEX MICROSCOPIC
Glucose, UA: >1000 mg/dL — AB
Leukocytes, UA: NEGATIVE
pH: 5 (ref 5.0–8.0)

## 2011-08-27 LAB — CBC
HCT: 39.5 % (ref 39.0–52.0)
MCHC: 34.4 g/dL (ref 30.0–36.0)
MCV: 89.4 fL (ref 78.0–100.0)
RDW: 13 % (ref 11.5–15.5)

## 2011-08-27 LAB — INFLUENZA PANEL BY PCR (TYPE A & B)
H1N1 flu by pcr: NOT DETECTED
Influenza A By PCR: POSITIVE — AB
Influenza B By PCR: NEGATIVE

## 2011-08-27 LAB — HIV ANTIBODY (ROUTINE TESTING W REFLEX): HIV: NONREACTIVE

## 2011-08-27 MED ORDER — INSULIN ASPART 100 UNIT/ML ~~LOC~~ SOLN
0.0000 [IU] | SUBCUTANEOUS | Status: DC
  Administered 2011-08-27: 8 [IU] via SUBCUTANEOUS
  Filled 2011-08-27: qty 1

## 2011-08-27 MED ORDER — SODIUM CHLORIDE 0.9 % IV SOLN: 250.0000 mL | INTRAVENOUS | Status: DC | PRN

## 2011-08-27 MED ORDER — INSULIN ASPART 100 UNIT/ML ~~LOC~~ SOLN
8.0000 [IU] | SUBCUTANEOUS | Status: AC
  Administered 2011-08-27: 8 [IU] via SUBCUTANEOUS
  Filled 2011-08-27: qty 1

## 2011-08-27 MED ORDER — DEXTROSE 10 % IV SOLN: INTRAVENOUS | Status: DC

## 2011-08-27 MED ORDER — DIAZEPAM 2 MG PO TABS
2.0000 mg | ORAL_TABLET | Freq: Three times a day (TID) | ORAL | Status: DC | PRN
  Administered 2011-08-27 – 2011-08-31 (×9): 2 mg via ORAL
  Filled 2011-08-27 (×3): qty 1
  Filled 2011-08-27: qty 2
  Filled 2011-08-27 (×5): qty 1

## 2011-08-27 MED ORDER — INSULIN ASPART 100 UNIT/ML ~~LOC~~ SOLN
0.0000 [IU] | SUBCUTANEOUS | Status: DC
  Administered 2011-08-27: 4 [IU] via SUBCUTANEOUS
  Administered 2011-08-27: 0 [IU] via SUBCUTANEOUS
  Filled 2011-08-27: qty 1

## 2011-08-27 MED ORDER — SODIUM CHLORIDE 0.9 % IV SOLN
INTRAVENOUS | Status: DC
  Administered 2011-08-27 – 2011-08-28 (×2): via INTRAVENOUS
  Administered 2011-08-28: 1000 mL via INTRAVENOUS
  Administered 2011-08-29: 19:00:00 via INTRAVENOUS

## 2011-08-27 MED ORDER — ACETAMINOPHEN 325 MG PO TABS
650.0000 mg | ORAL_TABLET | ORAL | Status: DC | PRN
  Administered 2011-08-27 (×3): 650 mg via ORAL
  Filled 2011-08-27 (×3): qty 2

## 2011-08-27 MED ORDER — DEXTROSE 5 % IV SOLN
1.0000 g | Freq: Every day | INTRAVENOUS | Status: DC
  Administered 2011-08-27: 1 g via INTRAVENOUS
  Filled 2011-08-27: qty 10

## 2011-08-27 MED ORDER — AZITHROMYCIN 250 MG PO TABS
500.0000 mg | ORAL_TABLET | Freq: Every day | ORAL | Status: DC
  Administered 2011-08-27: 500 mg via ORAL
  Filled 2011-08-27: qty 2

## 2011-08-27 MED ORDER — ACETAMINOPHEN 325 MG PO TABS
650.0000 mg | ORAL_TABLET | Freq: Once | ORAL | Status: AC
  Administered 2011-08-27: 650 mg via ORAL
  Filled 2011-08-27: qty 2

## 2011-08-27 MED ORDER — INSULIN ASPART 100 UNIT/ML ~~LOC~~ SOLN
0.0000 [IU] | Freq: Three times a day (TID) | SUBCUTANEOUS | Status: DC
  Administered 2011-08-28: 11 [IU] via SUBCUTANEOUS
  Administered 2011-08-28 – 2011-08-29 (×3): 5 [IU] via SUBCUTANEOUS
  Administered 2011-08-29 – 2011-08-30 (×4): 8 [IU] via SUBCUTANEOUS
  Administered 2011-08-30: 5 [IU] via SUBCUTANEOUS
  Administered 2011-08-31: 3 [IU] via SUBCUTANEOUS
  Administered 2011-08-31: 8 [IU] via SUBCUTANEOUS
  Administered 2011-08-31 – 2011-09-01 (×2): 3 [IU] via SUBCUTANEOUS
  Administered 2011-09-01: 11 [IU] via SUBCUTANEOUS
  Filled 2011-08-27 (×3): qty 1

## 2011-08-27 MED ORDER — ALBUTEROL SULFATE (5 MG/ML) 0.5% IN NEBU
2.5000 mg | INHALATION_SOLUTION | Freq: Four times a day (QID) | RESPIRATORY_TRACT | Status: DC
  Administered 2011-08-27 – 2011-08-29 (×10): 2.5 mg via RESPIRATORY_TRACT
  Filled 2011-08-27 (×9): qty 0.5

## 2011-08-27 MED ORDER — ALBUTEROL SULFATE (5 MG/ML) 0.5% IN NEBU
INHALATION_SOLUTION | RESPIRATORY_TRACT | Status: AC
  Filled 2011-08-27: qty 0.5

## 2011-08-27 MED ORDER — DEXTROSE 5 % IV SOLN
500.0000 mg | INTRAVENOUS | Status: DC
  Administered 2011-08-27: 500 mg via INTRAVENOUS
  Filled 2011-08-27 (×3): qty 500

## 2011-08-27 MED ORDER — OSELTAMIVIR PHOSPHATE 75 MG PO CAPS
75.0000 mg | ORAL_CAPSULE | Freq: Two times a day (BID) | ORAL | Status: DC
  Administered 2011-08-27 – 2011-09-01 (×11): 75 mg via ORAL
  Filled 2011-08-27 (×14): qty 1

## 2011-08-27 MED ORDER — HEPARIN SODIUM (PORCINE) 5000 UNIT/ML IJ SOLN
5000.0000 [IU] | Freq: Three times a day (TID) | INTRAMUSCULAR | Status: DC
  Administered 2011-08-27 – 2011-09-01 (×16): 5000 [IU] via SUBCUTANEOUS
  Filled 2011-08-27 (×20): qty 1

## 2011-08-27 MED ORDER — INSULIN ASPART 100 UNIT/ML ~~LOC~~ SOLN
0.0000 [IU] | Freq: Every day | SUBCUTANEOUS | Status: DC
  Administered 2011-08-27: 3 [IU] via SUBCUTANEOUS
  Administered 2011-08-27: 2 [IU] via SUBCUTANEOUS
  Administered 2011-08-27 – 2011-08-29 (×2): 3 [IU] via SUBCUTANEOUS
  Administered 2011-08-31: 4 [IU] via SUBCUTANEOUS
  Filled 2011-08-27 (×2): qty 1
  Filled 2011-08-27: qty 3
  Filled 2011-08-27: qty 1

## 2011-08-27 MED ORDER — SODIUM CHLORIDE 0.9 % IV SOLN
INTRAVENOUS | Status: DC
  Administered 2011-08-27: 2.4 [IU]/h via INTRAVENOUS
  Filled 2011-08-27: qty 1

## 2011-08-27 MED ORDER — IPRATROPIUM BROMIDE 0.02 % IN SOLN
0.5000 mg | Freq: Four times a day (QID) | RESPIRATORY_TRACT | Status: DC
  Administered 2011-08-27 – 2011-08-29 (×10): 0.5 mg via RESPIRATORY_TRACT
  Filled 2011-08-27 (×10): qty 2.5

## 2011-08-27 MED ORDER — INSULIN ASPART 100 UNIT/ML ~~LOC~~ SOLN: 0.0000 [IU] | SUBCUTANEOUS | Status: DC

## 2011-08-27 NOTE — ED Notes (Signed)
Dr Tyson Alias, intensivist at bedside. Orders initiated for sliding scale insulin, updated pt on plan of care

## 2011-08-27 NOTE — ED Notes (Signed)
Per RT, venti mask d/c'd, placed pt on 6L [place] humidified o2, tolerating well

## 2011-08-27 NOTE — Progress Notes (Signed)
Patient ID: [name], male   DOB: 1956/05/15, 55 y.o.   MRN: [id number] Subjective: No events overnight.Complaints of shortness of breath and cough.  Objective:  Vital signs in last 24 hours:  Filed Vitals:   08/27/11 0539 08/27/11 0822 08/27/11 0852 08/27/11 1230  BP:   155/72 145/79  Pulse:      Temp:   98.9 F (37.2 C) 99 F (37.2 C)  TempSrc:    Oral  Resp:   26 22  SpO2: 98% 98% 95% 94%    Intake/Output from previous day:   Intake/Output Summary (Last 24 hours) at 08/27/11 1246 Last data filed at 08/27/11 1029  Gross per 24 hour  Intake      0 ml  Output    400 ml  Net   -400 ml    Physical Exam: General: Alert, awake, oriented x3, in no acute distress. HEENT: No bruits, no goiter. Moist mucous membranes, no scleral icterus, no conjunctival pallor. Heart: Regular rate and rhythm, without murmurs, rubs, gallops. Lungs: wheezing, rhonchi diffuseAbdomen: Soft, nontender, nondistended, positive bowel sounds. Extremities: No clubbing cyanosis or edema,  positive pedal pulses. Neuro: Grossly intact, nonfocal.    Lab Results:  Basic Metabolic Panel:    Component Value Date/Time   NA 132* 08/27/2011 0500   K 4.0 08/27/2011 0500   CL 95* 08/27/2011 0500   CO2 27 08/27/2011 0500   BUN 15 08/27/2011 0500   CREATININE 0.68 08/27/2011 0500   GLUCOSE 324* 08/27/2011 0500   CALCIUM 9.0 08/27/2011 0500   CBC:    Component Value Date/Time   WBC 18.4* 08/27/2011 0500   HGB 13.6 08/27/2011 0500   HCT 39.5 08/27/2011 0500   PLT 286 08/27/2011 0500   MCV 89.4 08/27/2011 0500   NEUTROABS 13.5* 08/26/2011 2255   LYMPHSABS 1.7 08/26/2011 2255   MONOABS 2.1* 08/26/2011 2255   EOSABS 0.0 08/26/2011 2255   BASOSABS 0.0 08/26/2011 2255      Lab 08/27/11 0500 08/26/11 2255  WBC 18.4* 17.3*  HGB 13.6 14.5  HCT 39.5 40.6  PLT 286 301  MCV 89.4 88.3  MCH 30.8 31.5  MCHC 34.4 35.7  RDW 13.0 13.0  LYMPHSABS -- 1.7  MONOABS -- 2.1*  EOSABS -- 0.0  BASOSABS -- 0.0    BANDABS -- --    Lab 08/27/11 0500 08/26/11 2255  NA 132* 127*  K 4.0 3.4*  CL 95* 88*  CO2 27 24  GLUCOSE 324* 556*  BUN 15 19  CREATININE 0.68 0.74  CALCIUM 9.0 10.0  MG 2.0 --   No results found for this basename: INR:5,PROTIME:5 in the last 168 hours Cardiac markers:  Lab 08/26/11 2255  CKMB 8.2*  TROPONINI <0.30  MYOGLOBIN --   No results found for this basename: POCBNP:3 in the last 168 hours No results found for this or any previous visit (from the past 240 hour(s)).  Studies/Results: Dg Chest 2 View  08/26/2011 IMPRESSION: Slightly worsened mild left basilar airspace opacity may reflect atelectasis or possibly mild pneumonia.    Ct Angio Chest W/cm &/or Wo Cm  08/26/2011   IMPRESSION:  1.  Negative for pulmonary embolism. 2.  Diffuse bronchial wall thickening and mild bronchiectasis with extensive"tree-in-bud" micronodularity of the right middle lobe and both lower lobes, suggestive of fungal or atypical infection, and typically seen with Mycobacterium avium intracellulare. 3.  Fatty infiltration of the liver.     Medications: Scheduled Meds:   . acetaminophen  650 mg Oral Once  .  albuterol  2.5 mg Nebulization Once  . albuterol  2.5 mg Nebulization Q6H   And  . ipratropium  0.5 mg Nebulization Q6H  . azithromycin (ZITHROMAX) 500 MG IVPB  500 mg Intravenous Once  . azithromycin (ZITHROMAX) 500 MG IVPB  500 mg Intravenous Q24H  . cefTRIAXone (ROCEPHIN)  IV  1 g Intravenous Once  . cefTRIAXone (ROCEPHIN)  IV  1 g Intravenous QHS  . heparin  5,000 Units Subcutaneous Q8H  . insulin aspart  0-4 Units Subcutaneous Q4H  . insulin aspart  8 Units Subcutaneous To ER  . ipratropium  0.5 mg Nebulization Once  . methylPREDNISolone (SOLU-MEDROL) injection  125 mg Intravenous Once  . oseltamivir  75 mg Oral BID  . sodium chloride  1,000 mL Intravenous Once  . sodium chloride  1,000 mL Intravenous Once  . DISCONTD: azithromycin  500 mg Oral Daily  . DISCONTD: insulin  aspart  0-4 Units Subcutaneous Q4H  . DISCONTD: insulin regular  8 Units Subcutaneous Once   Continuous Infusions:   . sodium chloride    . sodium chloride 125 mL/hr at 08/27/11 0353  . dextrose    . insulin (NOVOLIN-R) infusion 8.6 Units/hr (08/27/11 1220)  . DISCONTD: dextrose     PRN Meds:.sodium chloride, acetaminophen, diazepam, iohexol  Assessment/Plan:  Principal Problem:  *Pneumonia - continue azithromycin and ceftriaxone for now; 2 L oxygen via nasal canula; nebulizer treatment Active Problems:  Acute respiratory failure likely secondary to pneumonia or bronchiectatic changes;continue schedules nebulizer treatments  Diabetes insipidus - sliding scale insulin; continue CBG monitoring    LOS: 1 day   Gwenna Fuston 08/27/2011, 12:46 PM

## 2011-08-27 NOTE — ED Notes (Addendum)
Paged intensivist regarding recent CBG,possibly d/c'ing insulin drip, and if pt can go to stepdown/tele vs ICU

## 2011-08-27 NOTE — H&P (Signed)
BLADE SCHEFF is an 55 y.o. male.   Chief Complaint: Acute respiratory failure, pneumonia HPI: Pt is a 55 y/o M with PMHx of kidney stones and DM who was initially seen 2 days ago at the urgent care clinic at [facility] due to sore throat, myalgias, dry cough, wheezing and SOB. Pt was given flonase for nasasl congestion, prednisone 40 mg for 2 days and albuterol. He comes today to [facility] and reports that the symptoms did not improve and had worsening of cough now with greenish sputum production, more wheezing, chest tightness and SOB. Pt was found to have hypoxia on RA and was started on 2LNC and ABG showed 7.48/36/58/26. Pt was then started on Vent mask 50%. WBC was 17 and CTA showed no PE but tree-in-bud nodules and bronchial wall thickening.   ROS: all other systems were negative except as above in HPI  Past Medical History  Diagnosis Date  . Kidney stone     Past Surgical History  Procedure Date  . Lithotripsy   . Uretral stent     History reviewed. No pertinent family history. Social History:  reports that he has quit smoking. He does not have any smokeless tobacco history on file. He reports that he drinks alcohol. He reports that he does not use illicit drugs.  Allergies: No Known Allergies  Medications Prior to Admission  Medication Dose Route Frequency Provider Last Rate Last Dose  . 0.9 %  sodium chloride infusion   Intravenous Continuous Glynn Octave, MD      . 0.9 %  sodium chloride infusion  250 mL Intravenous PRN Orbie Hurst      . 0.9 %  sodium chloride infusion   Intravenous Continuous Orbie Hurst      . albuterol (PROVENTIL) (5 MG/ML) 0.5% nebulizer solution 2.5 mg  2.5 mg Nebulization Once Glynn Octave, MD   2.5 mg at 08/26/11 2256  . albuterol (PROVENTIL) (5 MG/ML) 0.5% nebulizer solution 2.5 mg  2.5 mg Nebulization Q6H Katrianna Friesenhahn       And  . ipratropium (ATROVENT) nebulizer solution 0.5 mg  0.5 mg Nebulization Q6H Orbie Hurst      . azithromycin (ZITHROMAX) 500 mg in  dextrose 5 % 250 mL IVPB  500 mg Intravenous Once Glynn Octave, MD   500 mg at 08/27/11 0019  . azithromycin (ZITHROMAX) tablet 500 mg  500 mg Oral Daily Orbie Hurst      . cefTRIAXone (ROCEPHIN) 1 g in dextrose 5 % 50 mL IVPB  1 g Intravenous Once Glynn Octave, MD   1 g at 08/26/11 2331  . cefTRIAXone (ROCEPHIN) 1 g in dextrose 5 % 50 mL IVPB  1 g Intravenous Q24H Orbie Hurst      . dextrose 10 % infusion   Intravenous Continuous Orbie Hurst      . heparin injection 5,000 Units  5,000 Units Subcutaneous Q8H Orbie Hurst      . insulin aspart (novoLOG) injection 0-4 Units  0-4 Units Subcutaneous Q4H Orbie Hurst      . insulin aspart (novoLOG) injection 8 Units  8 Units Subcutaneous To ER Tressie Ellis Darlina Guys., PHARMD   8 Units at 08/27/11 0020  . insulin regular (HUMULIN R,NOVOLIN R) 1 Units/mL in sodium chloride 0.9 % 100 mL infusion   Intravenous Continuous Orbie Hurst      . iohexol (OMNIPAQUE) 300 MG/ML injection 100 mL  100 mL Intravenous Once PRN Medication Radiologist   80 mL at 08/26/11 2331  .  ipratropium (ATROVENT) nebulizer solution 0.5 mg  0.5 mg Nebulization Once Glynn Octave, MD   0.5 mg at 08/26/11 2256  . methylPREDNISolone sodium succinate (SOLU-MEDROL) 125 MG injection 125 mg  125 mg Intravenous Once Glynn Octave, MD   125 mg at 08/26/11 2328  . oseltamivir (TAMIFLU) capsule 75 mg  75 mg Oral BID Orbie Hurst      . sodium chloride 0.9 % bolus 1,000 mL  1,000 mL Intravenous Once Glynn Octave, MD   1,000 mL at 08/26/11 2328  . sodium chloride 0.9 % bolus 1,000 mL  1,000 mL Intravenous Once Glynn Octave, MD      . DISCONTD: dextrose 10 % infusion   Intravenous Continuous Orbie Hurst      . DISCONTD: insulin aspart (novoLOG) injection 0-4 Units  0-4 Units Subcutaneous Q4H Orbie Hurst      . DISCONTD: insulin regular (HUMULIN R,NOVOLIN R) 100 units/mL injection 8 Units  8 Units Subcutaneous Once Glynn Octave, MD       Medications Prior to Admission  Medication Sig  Dispense Refill  . albuterol (PROVENTIL HFA;VENTOLIN HFA) 108 (90 BASE) MCG/ACT inhaler Inhale 1-2 puffs into the lungs every 6 (six) hours as needed for wheezing.  1 Inhaler  0  . dextromethorphan-guaiFENesin (MUCINEX DM) 30-600 MG per 12 hr tablet Take 1 tablet by mouth every 12 (twelve) hours.        . fluticasone (FLONASE) 50 MCG/ACT nasal spray Place 2 sprays into the nose daily.  16 g  0  . HYDROcodone-homatropine (HYCODAN) 5-1.5 MG/5ML syrup Take 5 mLs by mouth every 6 (six) hours as needed for cough or pain.  120 mL  0    Results for orders placed during the hospital encounter of 08/26/11 (from the past 48 hour(s))  BLOOD GAS, ARTERIAL     Status: Abnormal   Collection Time   08/26/11 10:53 PM      Component Value Range Comment   O2 Content 5.0      Delivery systems NASAL CANNULA      pH, Arterial 7.481 (*) 7.350 - 7.450     pCO2 arterial 36.3  35.0 - 45.0 (mmHg)    pO2, Arterial 58.1 (*) 80.0 - 100.0 (mmHg)    Bicarbonate 26.8 (*) 20.0 - 24.0 (mEq/L)    TCO2 23.0  0 - 100 (mmol/L)    Acid-Base Excess 3.8 (*) 0.0 - 2.0 (mmol/L)    O2 Saturation 90.0      Patient temperature 98.6      Collection site LEFT RADIAL      Drawn by [id number]      Sample type ARTERIAL DRAW      Allens test (pass/fail) PASS  PASS    CBC     Status: Abnormal   Collection Time   08/26/11 10:55 PM      Component Value Range Comment   WBC 17.3 (*) 4.0 - 10.5 (K/uL)    RBC 4.60  4.22 - 5.81 (MIL/uL)    Hemoglobin 14.5  13.0 - 17.0 (g/dL)    HCT 04.5  40.9 - 81.1 (%)    MCV 88.3  78.0 - 100.0 (fL)    MCH 31.5  26.0 - 34.0 (pg)    MCHC 35.7  30.0 - 36.0 (g/dL)    RDW 91.4  78.2 - 95.6 (%)    Platelets 301  150 - 400 (K/uL)   DIFFERENTIAL     Status: Abnormal   Collection Time   08/26/11 10:55 PM  Component Value Range Comment   Neutrophils Relative 78 (*) 43 - 77 (%)    Lymphocytes Relative 10 (*) 12 - 46 (%)    Monocytes Relative 12  3 - 12 (%)    Eosinophils Relative 0  0 - 5 (%)     Basophils Relative 0  0 - 1 (%)    Neutro Abs 13.5 (*) 1.7 - 7.7 (K/uL)    Lymphs Abs 1.7  0.7 - 4.0 (K/uL)    Monocytes Absolute 2.1 (*) 0.1 - 1.0 (K/uL)    Eosinophils Absolute 0.0  0.0 - 0.7 (K/uL)    Basophils Absolute 0.0  0.0 - 0.1 (K/uL)    RBC Morphology POLYCHROMASIA PRESENT      WBC Morphology MILD LEFT SHIFT (1-5% METAS, OCC MYELO, OCC BANDS)     BASIC METABOLIC PANEL     Status: Abnormal   Collection Time   08/26/11 10:55 PM      Component Value Range Comment   Sodium 127 (*) 135 - 145 (mEq/L)    Potassium 3.4 (*) 3.5 - 5.1 (mEq/L)    Chloride 88 (*) 96 - 112 (mEq/L)    CO2 24  19 - 32 (mEq/L)    Glucose, Bld 556 (*) 70 - 99 (mg/dL)    BUN 19  6 - 23 (mg/dL)    Creatinine, Ser 6.57  0.50 - 1.35 (mg/dL)    Calcium 84.6  8.4 - 10.5 (mg/dL)    GFR calc non Af Amer >90  >90 (mL/min)    GFR calc Af Amer >90  >90 (mL/min)   CARDIAC PANEL(CRET KIN+CKTOT+MB+TROPI)     Status: Abnormal   Collection Time   08/26/11 10:55 PM      Component Value Range Comment   Total CK 913 (*) 7 - 232 (U/L)    CK, MB 8.2 (*) 0.3 - 4.0 (ng/mL)    Troponin I <0.30  <0.30 (ng/mL)    Relative Index 0.9  0.0 - 2.5    D-DIMER, QUANTITATIVE     Status: Abnormal   Collection Time   08/26/11 10:55 PM      Component Value Range Comment   D-Dimer, Quant 1.26 (*) 0.00 - 0.48 (ug/mL-FEU)   URINALYSIS, ROUTINE W REFLEX MICROSCOPIC     Status: Abnormal   Collection Time   08/27/11 12:09 AM      Component Value Range Comment   Color, Urine YELLOW  YELLOW     APPearance CLEAR  CLEAR     Specific Gravity, Urine 1.045 (*) 1.005 - 1.030     pH 5.0  5.0 - 8.0     Glucose, UA >1000 (*) NEGATIVE (mg/dL)    Hgb urine dipstick LARGE (*) NEGATIVE     Bilirubin Urine NEGATIVE  NEGATIVE     Ketones, ur TRACE (*) NEGATIVE (mg/dL)    Protein, ur NEGATIVE  NEGATIVE (mg/dL)    Urobilinogen, UA 0.2  0.0 - 1.0 (mg/dL)    Nitrite NEGATIVE  NEGATIVE     Leukocytes, UA NEGATIVE  NEGATIVE    URINE MICROSCOPIC-ADD ON      Status: Normal   Collection Time   08/27/11 12:09 AM      Component Value Range Comment   Squamous Epithelial / LPF RARE  RARE     WBC, UA 0-2  <3 (WBC/hpf)    RBC / HPF 21-50  <3 (RBC/hpf)    Dg Chest 2 View  08/26/2011  *RADIOLOGY REPORT*  Clinical Data: Cough and generalized  chest pain; hypoxia.  History of smoking.  CHEST - 2 VIEW  Comparison: Chest radiograph performed 08/24/2011  Findings: The lungs are well-aerated.  Mild peribronchial thickening is noted. Persistent mild left basilar airspace opacity may reflect atelectasis or possibly mild pneumonia; this appears slightly worsened from the prior study.  There is no evidence of pleural effusion or pneumothorax.  The heart is normal in size; the mediastinal contour is within normal limits.  No acute osseous abnormalities are seen.  IMPRESSION: Slightly worsened mild left basilar airspace opacity may reflect atelectasis or possibly mild pneumonia.  Original Report Authenticated By: Tonia Ghent, M.D.   Ct Angio Chest W/cm &/or Wo Cm  08/26/2011  *RADIOLOGY REPORT*  Clinical Data:  Hypoxia and tachycardia are  CT ANGIOGRAPHY CHEST WITH CONTRAST  Technique:  Multidetector CT imaging of the chest was performed using the standard protocol during bolus administration of intravenous contrast.  Multiplanar CT image reconstructions including MIPs were obtained to evaluate the vascular anatomy.  Contrast: 80mL OMNIPAQUE IOHEXOL 300 MG/ML IV SOLN  Comparison:  Chest radiograph 08/26/2011  Findings:  Technically satisfactory evaluation of the pulmonary arteries., no focal filling defect is identified within the pulmonary arteries to suggest pulmonary embolism.  The thoracic aorta is normal in caliber and enhancement.  Negative for dissection.  Heart size is within normal limits.  There is left ventricular wall thickening.  Faint coronary artery atherosclerotic calcifications noted.  Negative for pleural or pericardial effusion.  There are scattered small  mediastinal lymph nodes.  No lymphadenopathy is identified in the chest.  Diffuse bilateral bronchial wall thickening and mild bronchiectasis, with "tree-in-bud" micronodularity in the right middle lobe, right lower lobe and left lower lobe.  Findings suggest an atypical or fungal infection, as often seen with Mycobacterium avium intracellulare.  No focal airspace disease or pneumothorax.  Visualized thoracic spine vertebral bodies are normal in height and alignment.  A small meningioma noted in the T6 vertebral body.  No acute fracture is identified.  Fatty infiltration of the liver is noted.  Review of the MIP images confirms the above findings.  IMPRESSION:  1.  Negative for pulmonary embolism. 2.  Diffuse bronchial wall thickening and mild bronchiectasis with extensive"tree-in-bud" micronodularity of the right middle lobe and both lower lobes, suggestive of fungal or atypical infection, and typically seen with Mycobacterium avium intracellulare. 3.  Fatty infiltration of the liver.  Original Report Authenticated By: Britta Mccreedy, M.D.    ROS all other systems were negative.  Blood pressure 153/80, pulse 131, temperature 98 F (36.7 C), temperature source Oral, resp. rate 30, SpO2 97.00%. Physical Exam  Pt is alert, oriented x3 on 50% vent mask Oral mucosa is moist, no JVD. No postnasal drip. + erythematous turbinates CV: regular rhythm, S1 and S2 normal Lungs: Mildly decreased breath sounds at the pulm bases with bibasilar crackles. No wheezes. Abdiomen: soft, non tender, non distended. BS + Extrem: no low extrem edema Neuro: no focal deficit.  Assessment/Plan  Pt is a 55 y/o M with PMHx of kidney stones and DM who was initially seen 2 days ago at the urgent care clinic at [facility] due to sore throat, myalgias, dry cough, wheezing and SOB and now presents with worsening of SOB, productive cough, hypoxia and CTA with tree-in-bud opacities and bronchial wall thickening.  1. Acute Respiratory  failure: - Likely due to pneumonia - atypical bacteria vs viral vs NTB vs fungal - CTA with bronchial wall thickening and tree-in-bud opacities - Elevated WBC - ABG  with hypoxemic resp failure - Started on Ceftriaxone and Azithromycin - Will get respiratory viral panel (influenza) - Sputum culture for AFB and bacteria - Will start Tamiflu empirically - Follow CXR and ABG - Will get HIV test - Transfer to the MICU - Albuterol/ipratropium  2. DM - Start ICU hyperglycemia protocol  3. DVT prophylaxis: Heparin  4. FEN: NS at 125 ml/h, NPO, replace electrolytes as needed.     Tayelor Osborne 08/27/2011, 3:46 AM

## 2011-08-27 NOTE — ED Notes (Signed)
Placed pt on droplet precautions due to + flu swab. Notified MD

## 2011-08-27 NOTE — ED Notes (Signed)
Spoke with Dr. Tyson Alias, pt to remain NPO at this time, updated pt

## 2011-08-27 NOTE — ED Notes (Signed)
Checked pt's CBG 1 hour post d/c insulin drip. CBG 476, repeated with 414 result. CBG results also post meal tray. Paged MD

## 2011-08-28 ENCOUNTER — Emergency Department (HOSPITAL_COMMUNITY): Payer: Medicaid Other

## 2011-08-28 ENCOUNTER — Encounter (HOSPITAL_COMMUNITY): Payer: Self-pay | Admitting: Emergency Medicine

## 2011-08-28 DIAGNOSIS — J1100 Influenza due to unidentified influenza virus with unspecified type of pneumonia: Secondary | ICD-10-CM

## 2011-08-28 LAB — CBC
MCH: 30.9 pg (ref 26.0–34.0)
MCHC: 33.9 g/dL (ref 30.0–36.0)
RDW: 13.8 % (ref 11.5–15.5)

## 2011-08-28 LAB — DIFFERENTIAL
Basophils Absolute: 0 10*3/uL (ref 0.0–0.1)
Basophils Relative: 0 % (ref 0–1)
Eosinophils Absolute: 0.2 10*3/uL (ref 0.0–0.7)
Lymphocytes Relative: 24 % (ref 12–46)
Monocytes Absolute: 1.1 10*3/uL — ABNORMAL HIGH (ref 0.1–1.0)
Neutrophils Relative %: 69 % (ref 43–77)

## 2011-08-28 LAB — COMPREHENSIVE METABOLIC PANEL
ALT: 45 U/L (ref 0–53)
Albumin: 2.9 g/dL — ABNORMAL LOW (ref 3.5–5.2)
Alkaline Phosphatase: 95 U/L (ref 39–117)
Calcium: 9 mg/dL (ref 8.4–10.5)
GFR calc Af Amer: >90 mL/min (ref >90)
Glucose, Bld: 217 mg/dL — ABNORMAL HIGH (ref 70–99)
Potassium: 3.4 mEq/L — ABNORMAL LOW (ref 3.5–5.1)
Sodium: 135 mEq/L (ref 135–145)
Total Protein: 6.7 g/dL (ref 6.0–8.3)

## 2011-08-28 LAB — GLUCOSE, CAPILLARY
Glucose-Capillary: 306 mg/dL — ABNORMAL HIGH (ref 70–99)
Glucose-Capillary: 239 mg/dL — ABNORMAL HIGH (ref 70–99)
Glucose-Capillary: 326 mg/dL — ABNORMAL HIGH (ref 70–99)

## 2011-08-28 IMAGING — CR DG CHEST 1V PORT
1 series · 1 of 1 positions shown · non-contrast
Comparison: Chest radiograph and CTA of the chest performed
[DATE]

CLINICAL DATA: Shortness of breath.

PORTABLE CHEST - 1 VIEW

[AP]
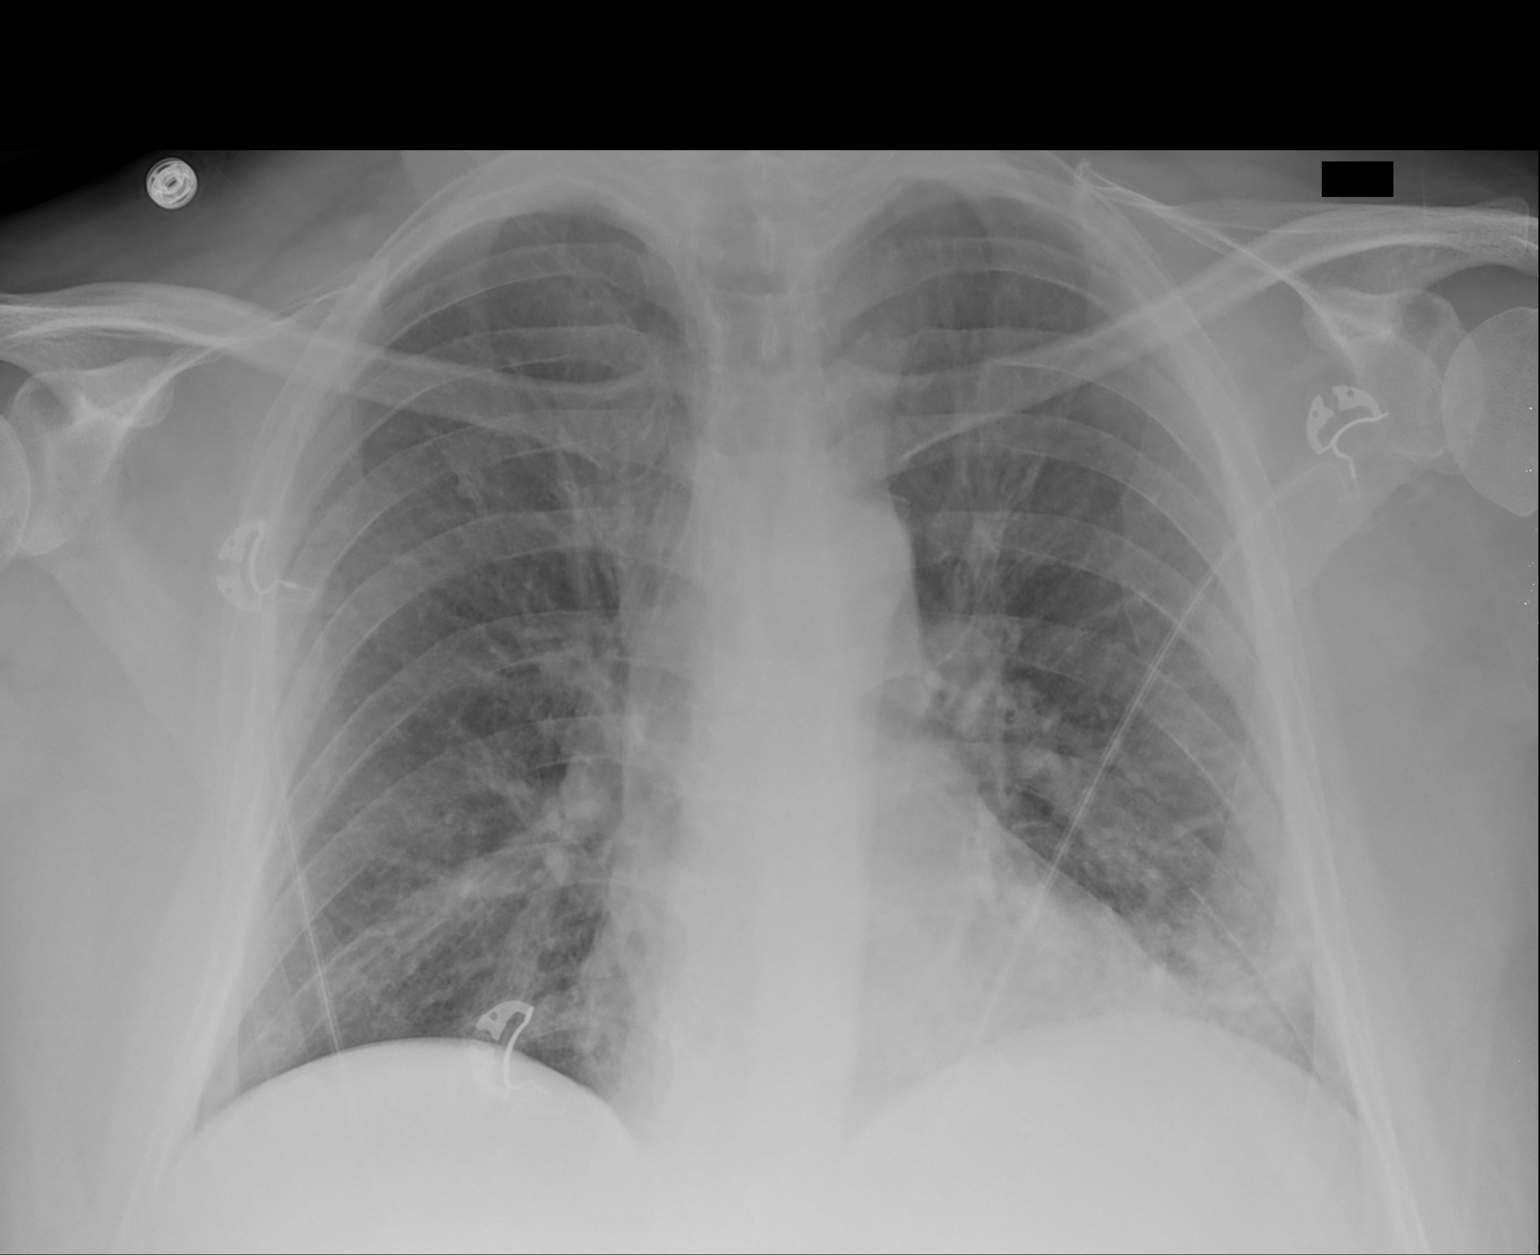

[1 of 1 positions shown; findings below may reference images not displayed]

FINDINGS: No endotracheal tube is seen.

The lungs are well-aerated.  Mild left basilar airspace opacity
raises concern for pneumonia.  Mild vascular congestion is seen.
There is no evidence of pleural effusion or pneumothorax.

The cardiomediastinal silhouette is borderline normal in size.  No
acute osseous abnormalities are seen.
IMPRESSION: 1.  Mild left basilar airspace opacity raises concern for
pneumonia.
2.  Mild vascular congestion seen; interstitial edema could
conceivably have a similar appearance.

## 2011-08-28 MED ORDER — POTASSIUM CHLORIDE CRYS ER 20 MEQ PO TBCR
20.0000 meq | EXTENDED_RELEASE_TABLET | Freq: Once | ORAL | Status: AC
  Administered 2011-08-28: 20 meq via ORAL
  Filled 2011-08-28: qty 1

## 2011-08-28 MED ORDER — IBUPROFEN 600 MG PO TABS
600.0000 mg | ORAL_TABLET | Freq: Four times a day (QID) | ORAL | Status: DC | PRN
  Administered 2011-09-01: 600 mg via ORAL
  Filled 2011-08-28: qty 1

## 2011-08-28 MED ORDER — VANCOMYCIN HCL IN DEXTROSE 1-5 GM/200ML-% IV SOLN
1000.0000 mg | Freq: Two times a day (BID) | INTRAVENOUS | Status: DC
  Administered 2011-08-28: 1000 mg via INTRAVENOUS
  Filled 2011-08-28 (×2): qty 200

## 2011-08-28 MED ORDER — ALBUTEROL SULFATE HFA 108 (90 BASE) MCG/ACT IN AERS: 1.0000 | INHALATION_SPRAY | Freq: Four times a day (QID) | RESPIRATORY_TRACT | Status: DC | PRN

## 2011-08-28 MED ORDER — VANCOMYCIN HCL 1000 MG IV SOLR
1250.0000 mg | Freq: Two times a day (BID) | INTRAVENOUS | Status: DC
  Administered 2011-08-28 – 2011-08-31 (×6): 1250 mg via INTRAVENOUS
  Filled 2011-08-28 (×7): qty 1250

## 2011-08-28 MED ORDER — HYDROCODONE-HOMATROPINE 5-1.5 MG/5ML PO SYRP
5.0000 mL | ORAL_SOLUTION | Freq: Four times a day (QID) | ORAL | Status: DC | PRN
  Administered 2011-08-28 – 2011-08-29 (×5): 5 mL via ORAL
  Administered 2011-08-30: 1 mL via ORAL
  Administered 2011-08-30 – 2011-08-31 (×4): 5 mL via ORAL
  Filled 2011-08-28 (×10): qty 5

## 2011-08-28 MED ORDER — PIPERACILLIN-TAZOBACTAM 3.375 G IVPB
3.3750 g | Freq: Four times a day (QID) | INTRAVENOUS | Status: DC
  Administered 2011-08-28: 3.375 g via INTRAVENOUS
  Filled 2011-08-28 (×4): qty 50

## 2011-08-28 MED ORDER — FLUTICASONE PROPIONATE 50 MCG/ACT NA SUSP
2.0000 | Freq: Every day | NASAL | Status: DC
  Administered 2011-08-29 – 2011-09-01 (×4): 2 via NASAL
  Filled 2011-08-28: qty 16

## 2011-08-28 MED ORDER — PIPERACILLIN-TAZOBACTAM 3.375 G IVPB
3.3750 g | Freq: Three times a day (TID) | INTRAVENOUS | Status: DC
  Administered 2011-08-28 – 2011-08-31 (×8): 3.375 g via INTRAVENOUS
  Filled 2011-08-28 (×12): qty 50

## 2011-08-28 MED ORDER — DM-GUAIFENESIN ER 30-600 MG PO TB12
1.0000 | ORAL_TABLET | Freq: Two times a day (BID) | ORAL | Status: DC
  Administered 2011-08-28 – 2011-09-01 (×8): 1 via ORAL
  Filled 2011-08-28 (×9): qty 1

## 2011-08-28 MED ORDER — THERA M PLUS PO TABS
1.0000 | ORAL_TABLET | Freq: Every day | ORAL | Status: DC
  Administered 2011-08-29 – 2011-09-01 (×4): 1 via ORAL
  Filled 2011-08-28 (×4): qty 1

## 2011-08-28 MED ORDER — INFLUENZA VIRUS VACC SPLIT PF IM SUSP: 0.5000 mL | INTRAMUSCULAR | Status: DC

## 2011-08-28 NOTE — Progress Notes (Signed)
ANTIBIOTIC CONSULT NOTE - INITIAL  Pharmacy Consult for Vancomycin / Zosyn Indication: Pneumonia  No Known Allergies  Patient Measurements:   Stated weight 192 pounds (87.3 kg), stated height 5'11"  Vital Signs: Temp: 98.3 F (36.8 C) (12/02 1540) Temp src: Oral (12/02 1540) BP: 155/84 mmHg (12/02 1540) Pulse Rate: 85  (12/02 1540) Intake/Output from previous day: 12/01 0701 - 12/02 0700 In: -  Out: 400 [Urine:400] Intake/Output from this shift:    Labs:  Basename 08/28/11 0449 08/27/11 0500 08/26/11 2255  WBC 17.7* 18.4* 17.3*  HGB 13.7 13.6 14.5  PLT 259 286 301  LABCREA -- -- --  CREATININE 0.58 0.68 0.74      Microbiology: Recent Results (from the past 720 hour(s))  CULTURE, RESPIRATORY     Status: Normal (Preliminary result)   Collection Time   08/27/11  4:47 AM      Component Value Range Status Comment   Specimen Description ASPIRATE TRACHEAL   Final    Special Requests NONE   Final    Gram Stain     Final    Value: ABUNDANT WBC PRESENT, PREDOMINANTLY PMN     RARE SQUAMOUS EPITHELIAL CELLS PRESENT     FEW GRAM POSITIVE RODS     FEW GRAM POSITIVE COCCI IN PAIRS     RARE GRAM NEGATIVE RODS   Culture Culture reincubated for better growth   Final    Report Status PENDING   Incomplete     Medical History: Past Medical History  Diagnosis Date  . Kidney stone     Received Vancomycin 1000mg  IV in ED at 1pm.   Was started on Zosyn 3.375 grams IV q8h (extended-infusion) and received at 11:30 am   Assessment: 55 year old M with pneumonia, acute respiratory failure, in setting of diabetes; to receive therapy with Vancomycin / Zosyn for broad-spectrum coverage.  Goal of Therapy:  -Zosyn dosage adjusted for renal function -Vancomycin trough 15-20  Plan:  -Zosyn 3.375 grams IV q8h (extended-infusion) -Vancomycin 1250mg  IV q12h, next dose at 8pm. -Check Vancomycin trough at steady-state unless rapid transition off Vanco is planned.   Elie Goody,  Pharm.D.  [phone] 08/28/2011 5:06 PM

## 2011-08-28 NOTE — ED Notes (Signed)
Attempted report will call back  

## 2011-08-28 NOTE — Progress Notes (Addendum)
Patient ID: [name], male   DOB: [birth year], 55 y.o.   MRN: [id number] Subjective: No events overnight. Patient complaints of shortness of breath and cough  Objective:  Vital signs in last 24 hours:  Filed Vitals:   08/28/11 0047 08/28/11 0621 08/28/11 0822 08/28/11 0910  BP: 166/86 171/99  143/73  Pulse: 99 83  93  Temp:  98.1 F (36.7 C)  98.2 F (36.8 C)  TempSrc:  Oral  Oral  Resp:    24  SpO2:   95% 97%    Intake/Output from previous day:   Intake/Output Summary (Last 24 hours) at 08/28/11 1048 Last data filed at 08/28/11 0900  Gross per 24 hour  Intake      0 ml  Output      0 ml  Net      0 ml    Physical Exam: General: Alert, awake, oriented x3, in no acute distress. HEENT: No bruits, no goiter. Moist mucous membranes, no scleral icterus, no conjunctival pallor. Heart: Regular rate and rhythm, without murmurs, rubs, gallops. Lungs: Clear to auscultation bilaterally. No wheezing, no rhonchi, no rales.  Abdomen: Soft, nontender, nondistended, positive bowel sounds. Extremities: No clubbing cyanosis or edema,  positive pedal pulses. Neuro: Grossly intact, nonfocal.    Lab Results:  Basic Metabolic Panel:    Component Value Date/Time   NA 135 08/28/2011 0449   K 3.4* 08/28/2011 0449   CL 99 08/28/2011 0449   CO2 27 08/28/2011 0449   BUN 17 08/28/2011 0449   CREATININE 0.58 08/28/2011 0449   GLUCOSE 217* 08/28/2011 0449   CALCIUM 9.0 08/28/2011 0449   CBC:    Component Value Date/Time   WBC 17.7* 08/28/2011 0449   HGB 13.7 08/28/2011 0449   HCT 40.4 08/28/2011 0449   PLT 259 08/28/2011 0449   MCV 91.2 08/28/2011 0449   NEUTROABS 12.2* 08/28/2011 0449   LYMPHSABS 4.2* 08/28/2011 0449   MONOABS 1.1* 08/28/2011 0449   EOSABS 0.2 08/28/2011 0449   BASOSABS 0.0 08/28/2011 0449      Lab 08/28/11 0449 08/27/11 0500 08/26/11 2255  WBC 17.7* 18.4* 17.3*  HGB 13.7 13.6 14.5  HCT 40.4 39.5 40.6  PLT 259 286 301  MCV 91.2 89.4 88.3  MCH 30.9 30.8 31.5  MCHC  33.9 34.4 35.7  RDW 13.8 13.0 13.0  LYMPHSABS 4.2* -- 1.7  MONOABS 1.1* -- 2.1*  EOSABS 0.2 -- 0.0  BASOSABS 0.0 -- 0.0  BANDABS -- -- --    Lab 08/28/11 0449 08/27/11 0500 08/26/11 2255  NA 135 132* 127*  K 3.4* 4.0 3.4*  CL 99 95* 88*  CO2 27 27 24   GLUCOSE 217* 324* 556*  BUN 17 15 19   CREATININE 0.58 0.68 0.74  CALCIUM 9.0 9.0 10.0  MG -- 2.0 --   No results found for this basename: INR:5,PROTIME:5 in the last 168 hours Cardiac markers:  Lab 08/26/11 2255  CKMB 8.2*  TROPONINI <0.30  MYOGLOBIN --   No results found for this basename: POCBNP:3 in the last 168 hours Recent Results (from the past 240 hour(s))  CULTURE, RESPIRATORY     Status: Normal (Preliminary result)   Collection Time   08/27/11  4:47 AM      Component Value Range Status Comment   Specimen Description ASPIRATE TRACHEAL   Final    Special Requests NONE   Final    Gram Stain     Final    Value: ABUNDANT WBC PRESENT, PREDOMINANTLY PMN  RARE SQUAMOUS EPITHELIAL CELLS PRESENT     FEW GRAM POSITIVE RODS     FEW GRAM POSITIVE COCCI IN PAIRS     RARE GRAM NEGATIVE RODS   Culture PENDING   Incomplete    Report Status PENDING   Incomplete     Studies/Results: Dg Chest 2 View  08/26/2011    IMPRESSION: Slightly worsened mild left basilar airspace opacity may reflect atelectasis or possibly mild pneumonia.  Original Report Authenticated By: Tonia Ghent, M.D.   Ct Angio Chest W/cm &/or Wo Cm  08/26/2011   IMPRESSION:  1.  Negative for pulmonary embolism. 2.  Diffuse bronchial wall thickening and mild bronchiectasis with extensive"tree-in-bud" micronodularity of the right middle lobe and both lower lobes, suggestive of fungal or atypical infection, and typically seen with Mycobacterium avium intracellulare. 3.  Fatty infiltration of the liver.  Original Report Authenticated By: Britta Mccreedy, M.D.   Dg Chest Port 1 View  08/28/2011    IMPRESSION:  1.  Mild left basilar airspace opacity raises concern  for pneumonia. 2.  Mild vascular congestion seen; interstitial edema could conceivably have a similar appearance.  Original Report Authenticated By: Tonia Ghent, M.D.    Medications: Scheduled Meds:   . albuterol  2.5 mg Nebulization Q6H   And  . ipratropium  0.5 mg Nebulization Q6H  . azithromycin (ZITHROMAX) 500 MG IVPB  500 mg Intravenous Q24H  . cefTRIAXone (ROCEPHIN)  IV  1 g Intravenous QHS  . heparin  5,000 Units Subcutaneous Q8H  . insulin aspart  0-15 Units Subcutaneous TID WC  . insulin aspart  0-5 Units Subcutaneous QHS  . oseltamivir  75 mg Oral BID  . potassium chloride  20 mEq Oral Once  . sodium chloride  1,000 mL Intravenous Once  . DISCONTD: azithromycin  500 mg Oral Daily  . DISCONTD: insulin aspart  0-4 Units Subcutaneous Q4H  . DISCONTD: insulin aspart  0-4 Units Subcutaneous Q4H   Continuous Infusions:   . sodium chloride    . sodium chloride 125 mL/hr at 08/28/11 0337  . DISCONTD: dextrose    . DISCONTD: insulin (NOVOLIN-R) infusion Stopped (08/27/11 1405)   PRN Meds:.sodium chloride, acetaminophen, diazepam, HYDROcodone-homatropine  Assessment/Plan:  Principal Problem:  *Pneumonia - patient afebrile for more than 24 hours, switch to vancomycin and zosyn; 2 L oxygen via nasal canula; nebulizer treatment  Active Problems:  Acute respiratory failure likely secondary to pneumonia or bronchiectatic changes;continue schedules nebulizer treatments  Diabetes insipidus - sliding scale insulin; continue CBG monitoring Hypokalemia - replete potassium     LOS: 2 days   DEVINE, ALMA 08/28/2011, 10:49 AM

## 2011-08-28 NOTE — ED Notes (Signed)
Diet tray ordered for pt 

## 2011-08-28 NOTE — Progress Notes (Signed)
Subjective: Chief Complaint: Acute respiratory failure, pneumonia  HPI: Pt is a 55 y/o M with PMHx of kidney stones and DM who was initially seen 2 days PTA at the urgent care clinic at [facility] due to sore throat, myalgias, dry cough, wheezing and SOB. Pt was given flonase for nasasl congestion, prednisone 40 mg for 2 days and albuterol. He came to [facility] and reports that the symptoms did not improve and had worsening of cough now with greenish sputum production, more wheezing, chest tightness and SOB. Pt was found to have hypoxia on RA and was started on 2LNC and ABG showed 7.48/36/58/26. Pt was then started on Vent mask 50%. WBC was 17 and CTA showed no PE but tree-in-bud nodules and bronchial wall thickening.   Weekend PCCM cover- Feels he can take a deeper breath, but cough still productive. Sinus drainage is watery.  Never a smoker, but wife is 2 ppd smoker with COPD.   Objective: Vital signs in last 24 hours: Temp:  [98.1 F (36.7 C)-99.5 F (37.5 C)] 98.2 F (36.8 C) (12/02 0910) Pulse Rate:  [83-113] 93  (12/02 0910) Resp:  [14-31] 24  (12/02 0910) BP: (143-171)/(73-99) 143/73 mmHg (12/02 0910) SpO2:  [90 %-97 %] 97 % (12/02 0910) FiO2 (%):  [50 %] 50 % (12/02 0621)  EXAM-  WDWN male, alert and cooperative, sitting up on gurney, still in ER. Skin- no rash Nodes- none found HEENT- nasal crusting, oral mucosa clear, no PNdrip. Hoarse, w/o stridor. No JVD. Lungs- bilateral; crackles, persistent dry cough Heart- RRR, no murmur or gallop Abd- soft, no HSM Ext- no cyanosis or edema  Lab Results:  Basename 08/28/11 0449 08/27/11 0500  WBC 17.7* 18.4*  HGB 13.7 13.6  HCT 40.4 39.5  PLT 259 286   BMET  Basename 08/28/11 0449 08/27/11 0500  NA 135 132*  K 3.4* 4.0  CL 99 95*  CO2 27 27  GLUCOSE 217* 324*  BUN 17 15  CREATININE 0.58 0.68  CALCIUM 9.0 9.0    Studies/Results: Dg Chest 2 View  08/26/2011  *RADIOLOGY REPORT*  Clinical Data: Cough and generalized chest pain;  hypoxia.  History of smoking.  CHEST - 2 VIEW  Comparison: Chest radiograph performed 08/24/2011  Findings: The lungs are well-aerated.  Mild peribronchial thickening is noted. Persistent mild left basilar airspace opacity may reflect atelectasis or possibly mild pneumonia; this appears slightly worsened from the prior study.  There is no evidence of pleural effusion or pneumothorax.  The heart is normal in size; the mediastinal contour is within normal limits.  No acute osseous abnormalities are seen.  IMPRESSION: Slightly worsened mild left basilar airspace opacity may reflect atelectasis or possibly mild pneumonia.  Original Report Authenticated By: Tonia Ghent, M.D.   Ct Angio Chest W/cm &/or Wo Cm  08/26/2011  *RADIOLOGY REPORT*  Clinical Data:  Hypoxia and tachycardia are  CT ANGIOGRAPHY CHEST WITH CONTRAST  Technique:  Multidetector CT imaging of the chest was performed using the standard protocol during bolus administration of intravenous contrast.  Multiplanar CT image reconstructions including MIPs were obtained to evaluate the vascular anatomy.  Contrast: 80mL OMNIPAQUE IOHEXOL 300 MG/ML IV SOLN  Comparison:  Chest radiograph 08/26/2011  Findings:  Technically satisfactory evaluation of the pulmonary arteries., no focal filling defect is identified within the pulmonary arteries to suggest pulmonary embolism.  The thoracic aorta is normal in caliber and enhancement.  Negative for dissection.  Heart size is within normal limits.  There is left ventricular wall  thickening.  Faint coronary artery atherosclerotic calcifications noted.  Negative for pleural or pericardial effusion.  There are scattered small mediastinal lymph nodes.  No lymphadenopathy is identified in the chest.  Diffuse bilateral bronchial wall thickening and mild bronchiectasis, with "tree-in-bud" micronodularity in the right middle lobe, right lower lobe and left lower lobe.  Findings suggest an atypical or fungal infection, as often  seen with Mycobacterium avium intracellulare.  No focal airspace disease or pneumothorax.  Visualized thoracic spine vertebral bodies are normal in height and alignment.  A small meningioma noted in the T6 vertebral body.  No acute fracture is identified.  Fatty infiltration of the liver is noted.  Review of the MIP images confirms the above findings.  IMPRESSION:  1.  Negative for pulmonary embolism. 2.  Diffuse bronchial wall thickening and mild bronchiectasis with extensive"tree-in-bud" micronodularity of the right middle lobe and both lower lobes, suggestive of fungal or atypical infection, and typically seen with Mycobacterium avium intracellulare. 3.  Fatty infiltration of the liver.  Original Report Authenticated By: Britta Mccreedy, M.D.   Dg Chest Port 1 View  08/28/2011  *RADIOLOGY REPORT*  Clinical Data: Shortness of breath.  PORTABLE CHEST - 1 VIEW  Comparison: Chest radiograph and CTA of the chest performed 08/26/2011  Findings: No endotracheal tube is seen.  The lungs are well-aerated.  Mild left basilar airspace opacity raises concern for pneumonia.  Mild vascular congestion is seen. There is no evidence of pleural effusion or pneumothorax.  The cardiomediastinal silhouette is borderline normal in size.  No acute osseous abnormalities are seen.  IMPRESSION:  1.  Mild left basilar airspace opacity raises concern for pneumonia. 2.  Mild vascular congestion seen; interstitial edema could conceivably have a similar appearance.  Original Report Authenticated By: Tonia Ghent, M.D.    Medications: I reviewed meds.  Assessment/Plan: 1) Influenza A bronchiolitis- on tamiflu. This will take time.  CT shows a bronchiolitis with "tree in bud"- not interpretable until the acute illness resolves. Suggest f/u CT w/o contrast in 2-3 months. Culture sputum now for AFB while cough is productive- ordered- looking for atypical AFB. 2) Pneumonia- Vanc/Zosyn 12/2> 3) Acute respiratory failure- still on oxygen,  nebs. 4) DM- aware of borderline hyperglycemia in past. Now arrived after initiating prednisone through [facility] . He should re-establish with previous PCP [facility] for outpatient f/u.  LOS: 2 days   YOUNG,CLINTON D 08/28/2011, 3:37 PM

## 2011-08-28 NOTE — ED Notes (Signed)
resp called for tx 

## 2011-08-28 NOTE — ED Notes (Signed)
Pt and belongings transported to 4E

## 2011-08-29 DIAGNOSIS — J1100 Influenza due to unidentified influenza virus with unspecified type of pneumonia: Secondary | ICD-10-CM

## 2011-08-29 LAB — COMPREHENSIVE METABOLIC PANEL
ALT: 34 U/L (ref 0–53)
AST: 25 U/L (ref 0–37)
Albumin: 2.5 g/dL — ABNORMAL LOW (ref 3.5–5.2)
Alkaline Phosphatase: 75 U/L (ref 39–117)
BUN: 12 mg/dL (ref 6–23)
CO2: 29 mEq/L (ref 19–32)
Calcium: 8.9 mg/dL (ref 8.4–10.5)
Chloride: 100 mEq/L (ref 96–112)
Creatinine, Ser: 0.66 mg/dL (ref 0.50–1.35)
GFR calc Af Amer: >90 mL/min (ref >90)
GFR calc non Af Amer: >90 mL/min (ref >90)
Glucose, Bld: 286 mg/dL — ABNORMAL HIGH (ref 70–99)
Potassium: 3.4 mEq/L — ABNORMAL LOW (ref 3.5–5.1)
Sodium: 136 mEq/L (ref 135–145)
Total Bilirubin: 0.3 mg/dL (ref 0.3–1.2)
Total Protein: 5.9 g/dL — ABNORMAL LOW (ref 6.0–8.3)

## 2011-08-29 LAB — CBC
HCT: 37.4 % — ABNORMAL LOW (ref 39.0–52.0)
Hemoglobin: 12.6 g/dL — ABNORMAL LOW (ref 13.0–17.0)
MCH: 30.4 pg (ref 26.0–34.0)
MCHC: 33.7 g/dL (ref 30.0–36.0)
MCV: 90.3 fL (ref 78.0–100.0)
Platelets: 242 10*3/uL (ref 150–400)
RBC: 4.14 MIL/uL — ABNORMAL LOW (ref 4.22–5.81)
RDW: 13.5 % (ref 11.5–15.5)
WBC: 15.4 10*3/uL — ABNORMAL HIGH (ref 4.0–10.5)

## 2011-08-29 LAB — GLUCOSE, CAPILLARY
Glucose-Capillary: 255 mg/dL — ABNORMAL HIGH (ref 70–99)
Glucose-Capillary: 234 mg/dL — ABNORMAL HIGH (ref 70–99)
Glucose-Capillary: 299 mg/dL — ABNORMAL HIGH (ref 70–99)

## 2011-08-29 LAB — CULTURE, RESPIRATORY W GRAM STAIN

## 2011-08-29 LAB — PATHOLOGIST SMEAR REVIEW

## 2011-08-29 MED ORDER — ALBUTEROL SULFATE (5 MG/ML) 0.5% IN NEBU
2.5000 mg | INHALATION_SOLUTION | RESPIRATORY_TRACT | Status: DC | PRN
Start: 2011-08-29 — End: 2011-09-01
  Filled 2011-08-29: qty 0.5

## 2011-08-29 MED ORDER — LIVING WELL WITH DIABETES BOOK
Freq: Once | Status: AC
  Administered 2011-08-29: 14:00:00
  Filled 2011-08-29: qty 1

## 2011-08-29 MED ORDER — LISINOPRIL 5 MG PO TABS
5.0000 mg | ORAL_TABLET | Freq: Every day | ORAL | Status: DC
  Administered 2011-08-29 – 2011-08-31 (×3): 5 mg via ORAL
  Filled 2011-08-29 (×3): qty 1

## 2011-08-29 MED ORDER — ALBUTEROL SULFATE (5 MG/ML) 0.5% IN NEBU
2.5000 mg | INHALATION_SOLUTION | Freq: Three times a day (TID) | RESPIRATORY_TRACT | Status: DC
  Administered 2011-08-29 – 2011-09-01 (×8): 2.5 mg via RESPIRATORY_TRACT
  Filled 2011-08-29 (×6): qty 0.5

## 2011-08-29 MED ORDER — IPRATROPIUM BROMIDE 0.02 % IN SOLN
0.5000 mg | RESPIRATORY_TRACT | Status: DC | PRN
Start: 2011-08-29 — End: 2011-09-01

## 2011-08-29 MED ORDER — IPRATROPIUM BROMIDE 0.02 % IN SOLN
0.5000 mg | Freq: Three times a day (TID) | RESPIRATORY_TRACT | Status: DC
  Administered 2011-08-29 – 2011-09-01 (×9): 0.5 mg via RESPIRATORY_TRACT
  Filled 2011-08-29 (×9): qty 2.5

## 2011-08-29 MED ORDER — POTASSIUM CHLORIDE CRYS ER 20 MEQ PO TBCR
20.0000 meq | EXTENDED_RELEASE_TABLET | Freq: Once | ORAL | Status: AC
  Administered 2011-08-29: 20 meq via ORAL
  Filled 2011-08-29 (×2): qty 1

## 2011-08-29 NOTE — Plan of Care (Signed)
Problem: Consults Goal: Diagnosis-Diabetes Mellitus Outcome: Completed/Met Date Met:  08/29/11 New Onset Type II

## 2011-08-29 NOTE — Progress Notes (Signed)
Subjective: "Don't feel much better. Not sleeping well" C/O pain when coughing  Objective: Vital signs Filed Vitals:   08/29/11 0111 08/29/11 0500 08/29/11 0555 08/29/11 0809  BP:   168/94   Pulse:   83   Temp:   98.5 F (36.9 C)   TempSrc:   Oral   Resp:   20   Height:      Weight:  87.862 kg (193 lb 11.2 oz)    SpO2: 94%  94% 97%   Weight change:  Last BM Date: 08/28/11  Intake/Output from previous day: 12/02 0701 - 12/03 0700 In: 1465 [P.O.:240; I.V.:875; IV Piggyback:350] Out: 800 [Urine:800] Total I/O In: -  Out: 250 [Urine:250]   Physical Exam: General: Alert, awake, oriented x3, in no acute distress. HEENT: No bruits, no goiter. Mucus membranes moist. PERRL EOMI Heart: Regular rate and rhythm, without murmurs, rubs, gallops. Lungs: Normal effort.BS with diffuse mild rhonchi and crackles in bases particularly left. No wheeze Abdomen: Soft, nontender, nondistended, positive bowel sounds. Extremities: No clubbing cyanosis or edema with positive pedal pulses. Neuro: Grossly intact, nonfocal. Cranial nerve II-XII intact    Lab Results: Basic Metabolic Panel:  Basename 08/29/11 0455 08/28/11 0449 08/27/11 0500  NA 136 135 --  K 3.4* 3.4* --  CL 100 99 --  CO2 29 27 --  GLUCOSE 286* 217* --  BUN 12 17 --  CREATININE 0.66 0.58 --  CALCIUM 8.9 9.0 --  MG -- -- 2.0  PHOS -- -- 2.9   Liver Function Tests:  Basename 08/29/11 0455 08/28/11 0449  AST 25 44*  ALT 34 45  ALKPHOS 75 95  BILITOT 0.3 0.4  PROT 5.9* 6.7  ALBUMIN 2.5* 2.9*   No results found for this basename: LIPASE:2,AMYLASE:2 in the last 72 hours No results found for this basename: AMMONIA:2 in the last 72 hours CBC:  Basename 08/29/11 0455 08/28/11 0449 08/26/11 2255  WBC 15.4* 17.7* --  NEUTROABS -- 12.2* 13.5*  HGB 12.6* 13.7 --  HCT 37.4* 40.4 --  MCV 90.3 91.2 --  PLT 242 259 --   Cardiac Enzymes:  Basename 08/26/11 2255  CKTOTAL 913*  CKMB 8.2*  CKMBINDEX --  TROPONINI  <0.30   BNP: No results found for this basename: POCBNP:3 in the last 72 hours D-Dimer:  [facility] 08/26/11 2255  DDIMER 1.26*   CBG:  Basename 08/29/11 0750 08/28/11 1728 08/28/11 1227 08/28/11 0920 08/28/11 0802 08/27/11 2326  GLUCAP 255* 326* 239* 306* 235* 202*   Hemoglobin A1C: No results found for this basename: HGBA1C in the last 72 hours Fasting Lipid Panel: No results found for this basename: CHOL,HDL,LDLCALC,TRIG,CHOLHDL,LDLDIRECT in the last 72 hours Thyroid Function Tests: No results found for this basename: TSH,T4TOTAL,FREET4,T3FREE,THYROIDAB in the last 72 hours Anemia Panel: No results found for this basename: VITAMINB12,FOLATE,FERRITIN,TIBC,IRON,RETICCTPCT in the last 72 hours Coagulation: No results found for this basename: LABPROT:2,INR:2 in the last 72 hours Urine Drug Screen: Drugs of Abuse  No results found for this basename: labopia, cocainscrnur, labbenz, amphetmu, thcu, labbarb    Alcohol Level: No results found for this basename: ETH:2 in the last 72 hours Urinalysis:  Misc. Labs:  Recent Results (from the past 240 hour(s))  CULTURE, RESPIRATORY     Status: Normal (Preliminary result)   Collection Time   08/27/11  4:47 AM      Component Value Range Status Comment   Specimen Description ASPIRATE TRACHEAL   Final    Special Requests NONE   Final  Gram Stain     Final    Value: ABUNDANT WBC PRESENT, PREDOMINANTLY PMN     RARE SQUAMOUS EPITHELIAL CELLS PRESENT     FEW GRAM POSITIVE RODS     FEW GRAM POSITIVE COCCI IN PAIRS     RARE GRAM NEGATIVE RODS   Culture Culture reincubated for better growth   Final    Report Status PENDING   Incomplete     Studies/Results: Dg Chest Port 1 View  08/28/2011  *RADIOLOGY REPORT*  Clinical Data: Shortness of breath.  PORTABLE CHEST - 1 VIEW  Comparison: Chest radiograph and CTA of the chest performed 08/26/2011  Findings: No endotracheal tube is seen.  The lungs are well-aerated.  Mild left basilar airspace  opacity raises concern for pneumonia.  Mild vascular congestion is seen. There is no evidence of pleural effusion or pneumothorax.  The cardiomediastinal silhouette is borderline normal in size.  No acute osseous abnormalities are seen.  IMPRESSION:  1.  Mild left basilar airspace opacity raises concern for pneumonia. 2.  Mild vascular congestion seen; interstitial edema could conceivably have a similar appearance.  Original Report Authenticated By: Tonia Ghent, M.D.    Medications: Scheduled Meds:   . albuterol  2.5 mg Nebulization Q6H   And  . ipratropium  0.5 mg Nebulization Q6H  . dextromethorphan-guaiFENesin  1 tablet Oral BID  . fluticasone  2 spray Each Nare Daily  . heparin  5,000 Units Subcutaneous Q8H  . insulin aspart  0-15 Units Subcutaneous TID WC  . insulin aspart  0-5 Units Subcutaneous QHS  . living well with diabetes book   Does not apply Once  . multivitamins ther. w/minerals  1 tablet Oral Daily  . oseltamivir  75 mg Oral BID  . piperacillin-tazobactam (ZOSYN)  IV  3.375 g Intravenous Q8H  . potassium chloride  20 mEq Oral Once  . sodium chloride  1,000 mL Intravenous Once  . vancomycin  1,250 mg Intravenous Q12H  . DISCONTD: azithromycin (ZITHROMAX) 500 MG IVPB  500 mg Intravenous Q24H  . DISCONTD: cefTRIAXone (ROCEPHIN)  IV  1 g Intravenous QHS  . DISCONTD: influenza  inactive virus vaccine  0.5 mL Intramuscular Tomorrow-1000  . DISCONTD: piperacillin-tazobactam (ZOSYN)  IV  3.375 g Intravenous Q6H  . DISCONTD: vancomycin  1,000 mg Intravenous Q12H   Continuous Infusions:   . sodium chloride 125 mL/hr at 08/28/11 1911  . sodium chloride 1,000 mL (08/28/11 1559)   PRN Meds:.sodium chloride, acetaminophen, albuterol, diazepam, HYDROcodone-homatropine, ibuprofen  Assessment/Plan:  Principal Problem: 1. *Pneumonia: afebrile for 48hrs. WC trending down. Vancomycin and zosyn day #3.  Active Problems: 2. Acute respiratory failure: likely due to #1 and/or  bronchiectatic changes. Improved. Sats 94-97 on 2.5L [place]. Cont scheduled nebs 3. Diabetes insipidus: monitor CBG. Sliding scale. Will consider carb  Modified diet once appetite improves 4. Hypokalemia- mild. Will replete and recheck in am 5. HTN- no hx HTN. Likely related to meds. Will monitor closely    LOS: 3 days   [facility] M 08/29/2011, 9:02 AM

## 2011-08-29 NOTE — Progress Notes (Signed)
[name] is a 55 y.o. male smoker admitted on 08/26/2011 with sore throat, dry cough, wheeze, myalgia, dyspnea, cough with green sputum, hypoxemia.  CT chest 11/30 showed tree-in-bud nodules, mild BTX and diffuse bronchial wall thickening.  Influenza A PCR positive 11/30.  Cx: Sputum 11/30>>negative  Abx: Zithromax 11/30>>12/01 Rocephin 11/30>>12/01 Tamiflu 11/30>> Zosyn 12/02>> Vancomycin 12/02>>  SUBJECTIVE: Still has cough with green sputum.  Still has nasal congestion, post-nasal drip, and sore throat.  Denies chest pain.  OBJECTIVE:  Blood pressure 139/78, pulse 91, temperature 98.5 F (36.9 C), temperature source Oral, resp. rate 18, height 5\' 11"  (1.803 m), weight 193 lb 11.2 oz (87.862 kg), SpO2 98.00%. 2.5 liters nasal cannula   Intake/Output Summary (Last 24 hours) at 08/29/11 1445 Last data filed at 08/29/11 1436  Gross per 24 hour  Intake   1825 ml  Output   1050 ml  Net    775 ml    General - appears fatigued HEENT - no sinus tenderness, no LAN Cardiac - s1s2 regular, no murmur Chest - b/l expiratory squeak more in upper lobes, faint basilar rales, no wheeze Abd - soft, non-tender Ext - no edema Neuro - normal strength, A&O x 3 Skin - no rashes Psych - normal mood, behavior   Lab Results  Component Value Date   WBC 15.4* 08/29/2011   HGB 12.6* 08/29/2011   HCT 37.4* 08/29/2011   MCV 90.3 08/29/2011   PLT 242 08/29/2011   Lab Results  Component Value Date   CREATININE 0.66 08/29/2011   BUN 12 08/29/2011   NA 136 08/29/2011   K 3.4* 08/29/2011   CL 100 08/29/2011   CO2 29 08/29/2011   Dg Chest Port 1 View  08/28/2011  *RADIOLOGY REPORT*  Clinical Data: Shortness of breath.  PORTABLE CHEST - 1 VIEW  Comparison: Chest radiograph and CTA of the chest performed 08/26/2011  Findings: No endotracheal tube is seen.  The lungs are well-aerated.  Mild left basilar airspace opacity raises concern for pneumonia.  Mild vascular congestion is seen. There is no  evidence of pleural effusion or pneumothorax.  The cardiomediastinal silhouette is borderline normal in size.  No acute osseous abnormalities are seen.  IMPRESSION:  1.  Mild left basilar airspace opacity raises concern for pneumonia. 2.  Mild vascular congestion seen; interstitial edema could conceivably have a similar appearance.  Original Report Authenticated By: Tonia Ghent, M.D.    ASSESSMENT/PLAN:  Influenza A bronchiolitis with ?superimposed bacterial pneumonia -continue tamiflu, zosyn, vancomycin -f/u CXR 12/04 -continue sinus regimen  Tree-in-bud appearance on CT chest -will need f/u CT chest in 3 months -f/u sputum AFB for atypical mycobacteria  Acute hypoxic respiratory failure  -titrate oxygen to keep SpO2 > 92%  Hx of tobacco abuse with ?COPD -continue nebulizer, but change to tid and q2hrs prn  KVO IV fluids  [name] Pager:  [phone] 08/29/2011, 2:45 PM

## 2011-08-30 ENCOUNTER — Inpatient Hospital Stay (HOSPITAL_COMMUNITY): Payer: Medicaid Other

## 2011-08-30 DIAGNOSIS — J1100 Influenza due to unidentified influenza virus with unspecified type of pneumonia: Secondary | ICD-10-CM

## 2011-08-30 LAB — BASIC METABOLIC PANEL
BUN: 10 mg/dL (ref 6–23)
Creatinine, Ser: 0.66 mg/dL (ref 0.50–1.35)
GFR calc non Af Amer: >90 mL/min (ref >90)
Glucose, Bld: 228 mg/dL — ABNORMAL HIGH (ref 70–99)
Potassium: 3.3 mEq/L — ABNORMAL LOW (ref 3.5–5.1)

## 2011-08-30 LAB — CBC
HCT: 37.2 % — ABNORMAL LOW (ref 39.0–52.0)
Hemoglobin: 12.9 g/dL — ABNORMAL LOW (ref 13.0–17.0)
MCH: 31.6 pg (ref 26.0–34.0)
MCHC: 34.7 g/dL (ref 30.0–36.0)
RDW: 13.5 % (ref 11.5–15.5)

## 2011-08-30 LAB — GLUCOSE, CAPILLARY
Glucose-Capillary: 219 mg/dL — ABNORMAL HIGH (ref 70–99)
Glucose-Capillary: 257 mg/dL — ABNORMAL HIGH (ref 70–99)

## 2011-08-30 IMAGING — CR DG CHEST 2V
2 series · 2 of 2 positions shown · non-contrast
Comparison: [DATE]

CLINICAL DATA: Follow up pneumonia

CHEST - 2 VIEW

[w chest pa]
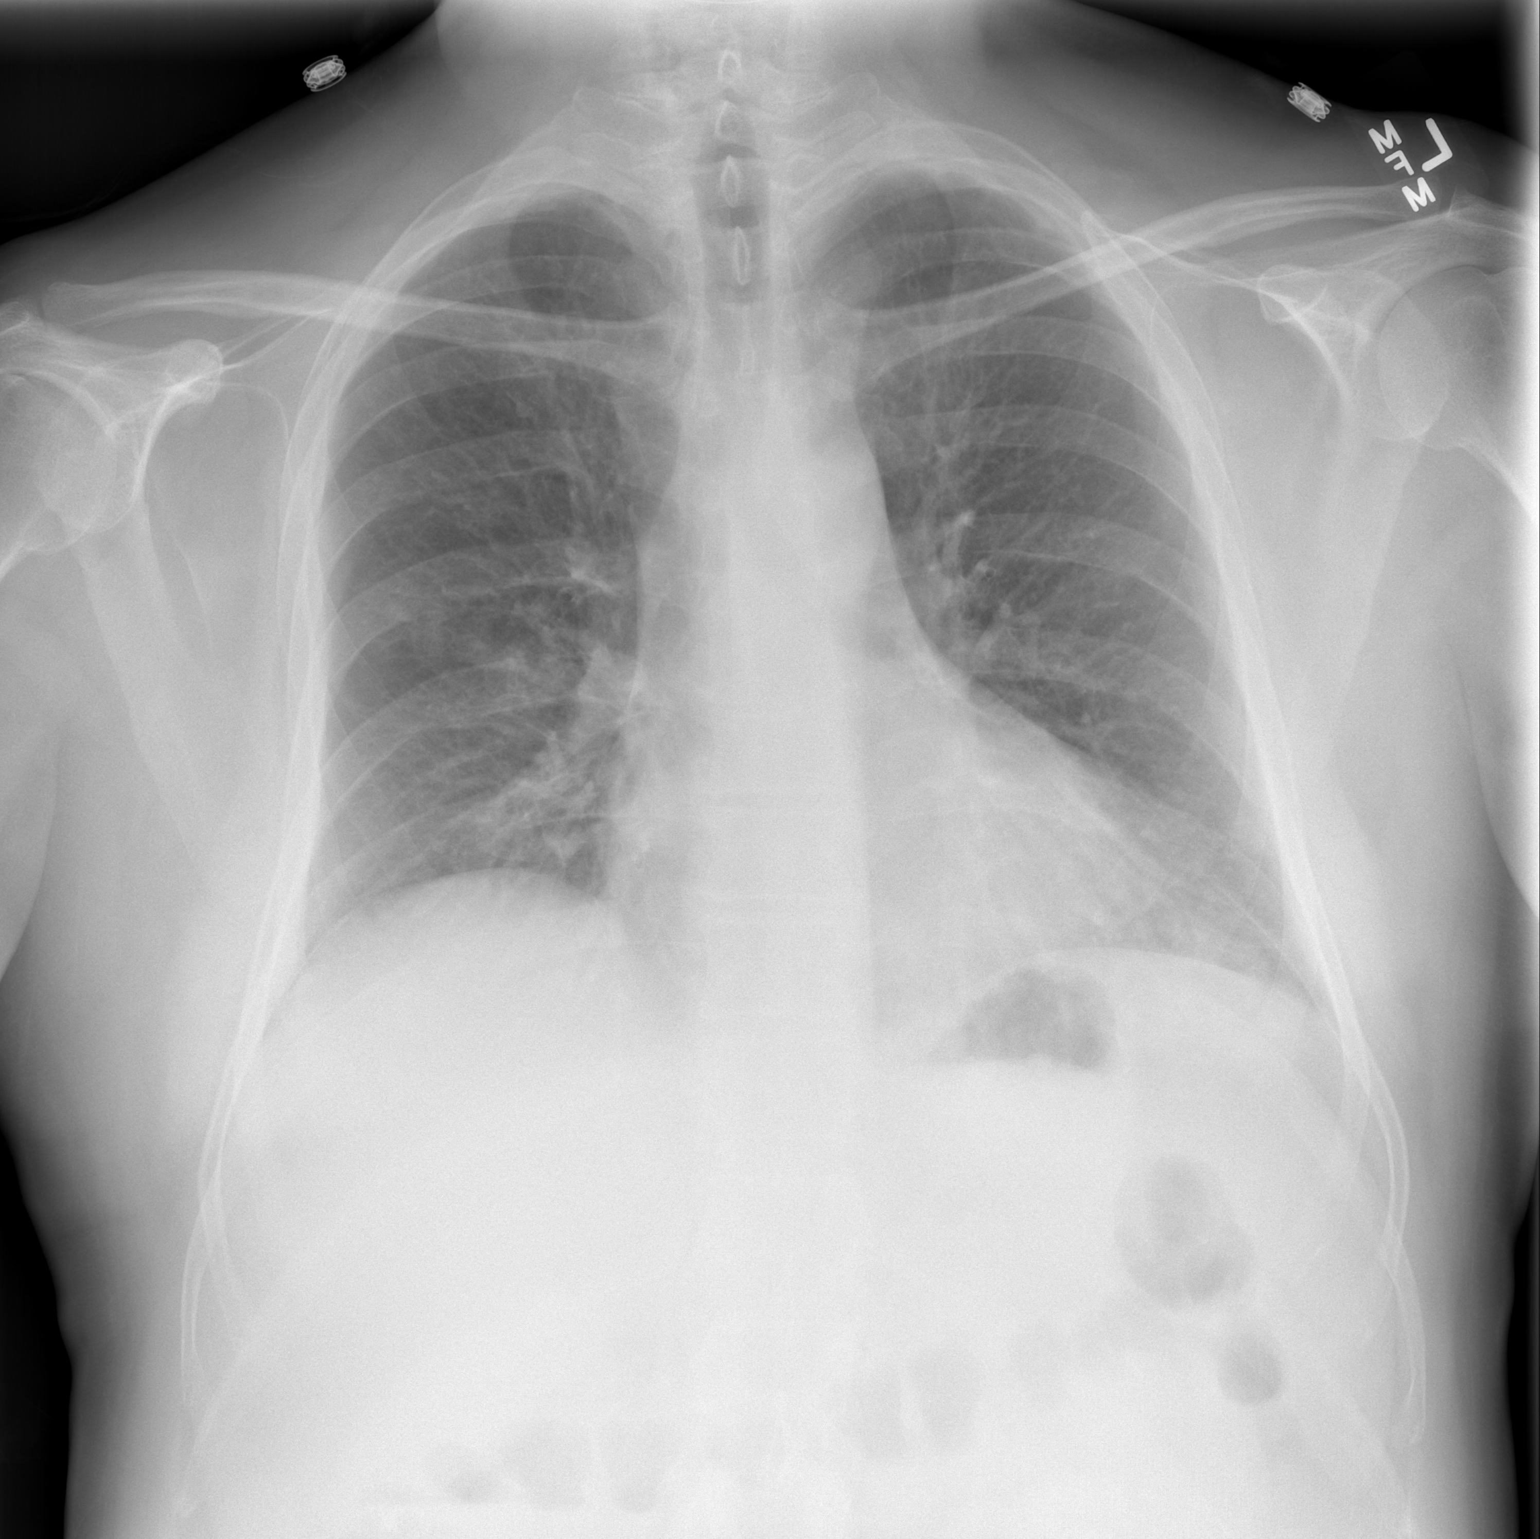

[w chest lat]
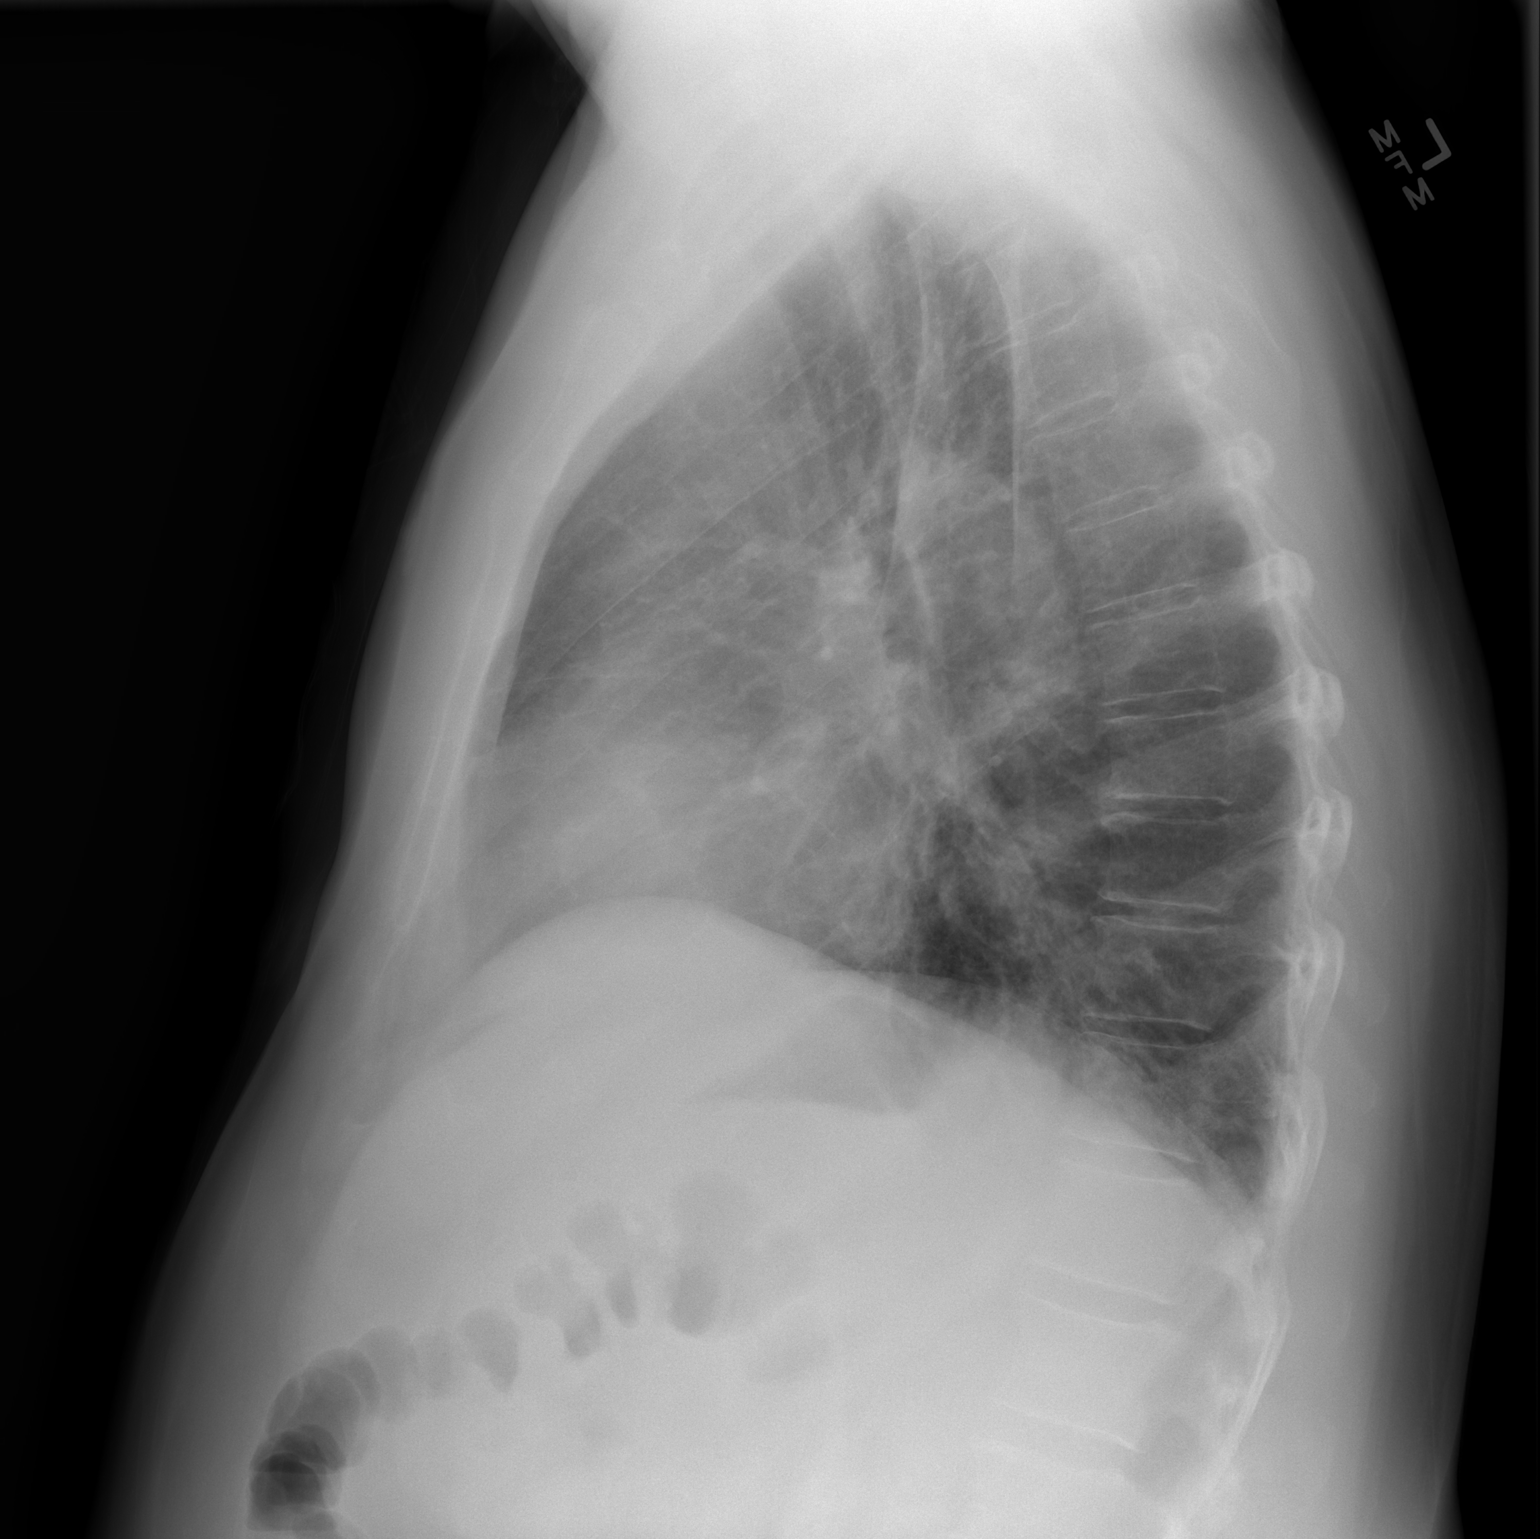

[2 of 2 positions shown; findings below may reference images not displayed]

FINDINGS: Heart size is normal.

No pleural effusion or pulmonary edema.

There is no airspace consolidation.

Coarsened interstitial markings are noted bilaterally, similar to
previous exam.

Review of the visualized osseous structures is unremarkable.
IMPRESSION: 1.  No evidence for pneumonia.
2.  Chronic interstitial coarsening.

## 2011-08-30 MED ORDER — POTASSIUM CHLORIDE CRYS ER 20 MEQ PO TBCR: 40.0000 meq | EXTENDED_RELEASE_TABLET | Freq: Once | ORAL | Status: DC

## 2011-08-30 MED ORDER — POTASSIUM CHLORIDE CRYS ER 20 MEQ PO TBCR
40.0000 meq | EXTENDED_RELEASE_TABLET | ORAL | Status: AC
  Administered 2011-08-30 (×2): 40 meq via ORAL
  Filled 2011-08-30 (×2): qty 2

## 2011-08-30 MED ORDER — INSULIN GLARGINE 100 UNIT/ML ~~LOC~~ SOLN
7.0000 [IU] | Freq: Every day | SUBCUTANEOUS | Status: DC
  Administered 2011-08-30: 7 [IU] via SUBCUTANEOUS
  Filled 2011-08-30: qty 3

## 2011-08-30 MED ORDER — ALBUTEROL SULFATE (5 MG/ML) 0.5% IN NEBU
INHALATION_SOLUTION | RESPIRATORY_TRACT | Status: AC
  Administered 2011-08-30: 2.5 mg via RESPIRATORY_TRACT
  Filled 2011-08-30: qty 0.5

## 2011-08-30 NOTE — Progress Notes (Signed)
[name] is a 55 y.o. male smoker admitted on 08/26/2011 with sore throat, dry cough, wheeze, myalgia, dyspnea, cough with green sputum, hypoxemia.  CT chest 11/30 showed tree-in-bud nodules, mild BTX and diffuse bronchial wall thickening.  Influenza A PCR positive 11/30.  Cx: Sputum 12/01>>negative  Abx: Zithromax 11/30>>12/01 Rocephin 11/30>>12/01 Tamiflu 11/30>> Zosyn 12/02>> Vancomycin 12/02>>  SUBJECTIVE: Still has cough but decreased sputum.  Feels improvement.  OBJECTIVE:  Blood pressure 159/90, pulse 85, temperature 98.4 F (36.9 C), temperature source Oral, resp. rate 20, height 5\' 11"  (1.803 m), weight 191 lb 8 oz (86.864 kg), SpO2 93.00%. 2.5 liters nasal cannula   Intake/Output Summary (Last 24 hours) at 08/30/11 1343 Last data filed at 08/30/11 1228  Gross per 24 hour  Intake 2311.67 ml  Output   1150 ml  Net 1161.67 ml    General - no distress HEENT - no sinus tenderness, no LAN Cardiac - s1s2 regular, no murmur Chest - improved air movement, no wheeze Abd - soft, non-tender Ext - no edema Neuro - normal strength, A&O x 3 Skin - no rashes Psych - normal mood, behavior   Lab Results  Component Value Date   WBC 13.2* 08/30/2011   HGB 12.9* 08/30/2011   HCT 37.2* 08/30/2011   MCV 91.2 08/30/2011   PLT 237 08/30/2011   Lab Results  Component Value Date   CREATININE 0.66 08/30/2011   BUN 10 08/30/2011   NA 139 08/30/2011   K 3.3* 08/30/2011   CL 101 08/30/2011   CO2 30 08/30/2011   Dg Chest 2 View  08/30/2011  *RADIOLOGY REPORT*  Clinical Data: Follow up pneumonia  CHEST - 2 VIEW  Comparison: 08/28/2011  Findings: Heart size is normal.  No pleural effusion or pulmonary edema.  There is no airspace consolidation.  Coarsened interstitial markings are noted bilaterally, similar to previous exam.  Review of the visualized osseous structures is unremarkable.  IMPRESSION:  1.  No evidence for pneumonia. 2.  Chronic interstitial coarsening.  Original Report  Authenticated By: Rosealee Albee, M.D.    ASSESSMENT/PLAN:  Influenza A bronchiolitis with ?superimposed bacterial pneumonia -has some radiographic improvement 12/04 -D5 of tamiflu -D3 of zosyn, vancomycin>>may be able to d/c soon if he continues to improve -continue sinus regimen  Tree-in-bud appearance on CT chest -will need f/u CT chest in 3 months -f/u sputum AFB for atypical mycobacteria  Acute hypoxic respiratory failure  -titrate oxygen to keep SpO2 > 92% -will need to assess whether he needs home oxygen  Hx of tobacco abuse with ?COPD -continue nebulizer tid and q2hrs prn  Will check later this week.  Call if help needed sooner.  Merrell Rettinger Pager:  [phone] 08/30/2011, 1:43 PM

## 2011-08-30 NOTE — Progress Notes (Signed)
Subjective: Feeling somewhat better. Eating a little better.  Objective: Vital signs Filed Vitals:   08/29/11 2116 08/30/11 0557 08/30/11 0734 08/30/11 0907  BP: 154/95 159/90    Pulse: 90 85    Temp: 99.3 F (37.4 C) 98.4 F (36.9 C)    TempSrc: Oral Oral    Resp: 24 20    Height:      Weight:   86.864 kg (191 lb 8 oz)   SpO2: 92% 93%  93%   Weight change:  Last BM Date: 08/28/11  Intake/Output from previous day: 12/03 0701 - 12/04 0700 In: 2491.7 [P.O.:600; I.V.:1241.7; IV Piggyback:650] Out: 1000 [Urine:1000] Total I/O In: 240 [P.O.:240] Out: 400 [Urine:400]   Physical Exam: General:awake but weak appears ill, oriented x3, in no acute distress. HEENT: No bruits, no goiter.mucus membranes mouth pink slightly dry Heart: Regular rate and rhythm, without murmurs, rubs, gallops. Lungs:Normal effort. Breath sounds with mild diffuse rhonchi and crackles in bases. Abdomen: Soft, nontender, nondistended, positive bowel sounds. Extremities: No clubbing cyanosis or edema with positive pedal pulses. Neuro: Grossly intact, nonfocal.    Lab Results: Basic Metabolic Panel:  Basename 08/30/11 0510 08/29/11 0455  NA 139 136  K 3.3* 3.4*  CL 101 100  CO2 30 29  GLUCOSE 228* 286*  BUN 10 12  CREATININE 0.66 0.66  CALCIUM 9.0 8.9  MG -- --  PHOS -- --   Liver Function Tests:  Basename 08/29/11 0455 08/28/11 0449  AST 25 44*  ALT 34 45  ALKPHOS 75 95  BILITOT 0.3 0.4  PROT 5.9* 6.7  ALBUMIN 2.5* 2.9*   No results found for this basename: LIPASE:2,AMYLASE:2 in the last 72 hours No results found for this basename: AMMONIA:2 in the last 72 hours CBC:  Basename 08/30/11 0510 08/29/11 0455 08/28/11 0449  WBC 13.2* 15.4* --  NEUTROABS -- -- 12.2*  HGB 12.9* 12.6* --  HCT 37.2* 37.4* --  MCV 91.2 90.3 --  PLT 237 242 --   Cardiac Enzymes: No results found for this basename: CKTOTAL:3,CKMB:3,CKMBINDEX:3,TROPONINI:3 in the last 72 hours BNP: No results found for  this basename: POCBNP:3 in the last 72 hours D-Dimer: No results found for this basename: DDIMER:2 in the last 72 hours CBG:  Basename 08/30/11 1126 08/30/11 0728 08/29/11 2114 08/29/11 1756 08/29/11 1207 08/29/11 0750  GLUCAP 257* 219* 272* 299* 234* 255*   Hemoglobin A1C: No results found for this basename: HGBA1C in the last 72 hours Fasting Lipid Panel: No results found for this basename: CHOL,HDL,LDLCALC,TRIG,CHOLHDL,LDLDIRECT in the last 72 hours Thyroid Function Tests: No results found for this basename: TSH,T4TOTAL,FREET4,T3FREE,THYROIDAB in the last 72 hours Anemia Panel: No results found for this basename: VITAMINB12,FOLATE,FERRITIN,TIBC,IRON,RETICCTPCT in the last 72 hours Coagulation: No results found for this basename: LABPROT:2,INR:2 in the last 72 hours Urine Drug Screen: Drugs of Abuse  No results found for this basename: labopia, cocainscrnur, labbenz, amphetmu, thcu, labbarb    Alcohol Level: No results found for this basename: ETH:2 in the last 72 hours Urinalysis:  Misc. Labs:  Recent Results (from the past 240 hour(s))  CULTURE, RESPIRATORY     Status: Normal   Collection Time   08/27/11  4:47 AM      Component Value Range Status Comment   Specimen Description ASPIRATE TRACHEAL   Final    Special Requests NONE   Final    Gram Stain     Final    Value: ABUNDANT WBC PRESENT, PREDOMINANTLY PMN     RARE SQUAMOUS EPITHELIAL  CELLS PRESENT     FEW GRAM POSITIVE RODS     FEW GRAM POSITIVE COCCI IN PAIRS     RARE GRAM NEGATIVE RODS   Culture Non-Pathogenic Oropharyngeal-type Flora Isolated.   Final    Report Status 08/29/2011 FINAL   Final   PATHOLOGIST SMEAR REVIEW     Status: Normal   Collection Time   08/28/11  4:49 AM      Component Value Range Status Comment   Tech Review Reviewed by H. Hollice Espy, M.D.   Final     Studies/Results: Dg Chest 2 View  08/30/2011  *RADIOLOGY REPORT*  Clinical Data: Follow up pneumonia  CHEST - 2 VIEW  Comparison:  08/28/2011  Findings: Heart size is normal.  No pleural effusion or pulmonary edema.  There is no airspace consolidation.  Coarsened interstitial markings are noted bilaterally, similar to previous exam.  Review of the visualized osseous structures is unremarkable.  IMPRESSION:  1.  No evidence for pneumonia. 2.  Chronic interstitial coarsening.  Original Report Authenticated By: Rosealee Albee, M.D.    Medications: Scheduled Meds:   . albuterol  2.5 mg Nebulization TID   And  . ipratropium  0.5 mg Nebulization TID  . dextromethorphan-guaiFENesin  1 tablet Oral BID  . fluticasone  2 spray Each Nare Daily  . heparin  5,000 Units Subcutaneous Q8H  . insulin aspart  0-15 Units Subcutaneous TID WC  . insulin aspart  0-5 Units Subcutaneous QHS  . lisinopril  5 mg Oral Daily  . multivitamins ther. w/minerals  1 tablet Oral Daily  . oseltamivir  75 mg Oral BID  . piperacillin-tazobactam (ZOSYN)  IV  3.375 g Intravenous Q8H  . potassium chloride  40 mEq Oral Q4H while awake  . sodium chloride  1,000 mL Intravenous Once  . vancomycin  1,250 mg Intravenous Q12H  . DISCONTD: albuterol  2.5 mg Nebulization Q6H  . DISCONTD: ipratropium  0.5 mg Nebulization Q6H  . DISCONTD: potassium chloride  40 mEq Oral Once   Continuous Infusions:   . sodium chloride 20 mL/hr at 08/29/11 2300  . DISCONTD: sodium chloride 125 mL/hr at 08/28/11 1911   PRN Meds:.sodium chloride, acetaminophen, albuterol, albuterol, diazepam, HYDROcodone-homatropine, ibuprofen, ipratropium  Assessment/Plan:  Principal Problem: 1. *Pneumonia/ Influenza A Bronchiolitis: repeat chest xray questionable for pneumonia. Appreciate assistance pulmonology. WC trending down slowly. Afebrile. Continue tamiflu, zosyn and vanc day #3. Sputum culture 11/30 neg. Continues with cough but slightly improved Active Problems: 2 Acute respiratory failure: secondary to problem #1. Sats 93% on room air. Sats 99% on nasal cannula. 3. 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 CBG range 219-257. Will start lantus 7 units. Appetite slightly improved today. Will change diet to carb modified starting tomorrow.  4. Tree-in-bud appearance on CT. F/U CT in 3 months.  5.Hx tobacco abuse with ? COPD. No wheezing. Continue nebulizer. 6. Hypokalemia. Remains inspite of yesterdays supplement. Will supplement with now and in 4 hrs. Will check mag level and repeat bmet in am 7. HTN: no hx. Started ACE yesterday mostly for renal protection and problem #3. Control remains only fair. Has had only 1 dose. Will monitor and consider dose adjustment if appropriate.    LOS: 4 days   [facility] M 08/30/2011, 1:43 PM

## 2011-08-30 NOTE — Progress Notes (Signed)
   CARE MANAGEMENT NOTE 08/30/2011  Patient:  [name], [name]   Account Number:  [id number]  Date Initiated:  08/30/2011  Documentation initiated by:  Raiford Noble  Subjective/Objective Assessment:   pt adm with resp failure, pneumonia, dm     Action/Plan:   lives with wife- no insurance   Anticipated DC Date:  09/01/2011   Anticipated DC Plan:  HOME/SELF CARE  In-house referral  Financial Counselor      DC Planning Services  CM consult  Follow-up appt scheduled  [facility] / [facility] (established/new)  Indigent [facility] Clinic  Medication Assistance      [facility] Choice  NA   Choice offered to / List presented to:  NA   DME arranged  NA      DME agency  NA     HH arranged  NA      HH agency  NA   Status of service:  In process, will continue to follow Medicare Important Message given?   (If response is "NO", the following Medicare IM given date fields will be blank) Date Medicare IM given:   Date Additional Medicare IM given:    Discharge Disposition:  HOME/SELF CARE  Per UR Regulation:  Reviewed for med. necessity/level of care/duration of stay  Comments:  08-30-11 Raiford Noble, RN,BSN [phone] Spoke with patient at bedside. Does not have insurance. Has seen Dr Nicholos Johns with [facility] in the past but currently does not have PCP. Willl like for me to make appt for [facility]. Pt qualifies for indigent funds. Made referral to Dorothy with [facility]. Appt for [facility] is 10-10-11 at 1145am for eligibility. Md appt with Dr. Sherryll Burger on 10-09-11 with [facility]. Pt will need 4 dollar prescriptions if possible. Also for insulin, [facility] has 70/30 for generic brand. MD please take into consideration pt does not have insurance when writing home med prescriptions. Pt was off 02 when I s spoke with him at bedside. Likley not need home 02 upon dc. Will follow and give patient rx discount card, need[facility] med[facility] website as well.

## 2011-08-31 LAB — GLUCOSE, CAPILLARY
Glucose-Capillary: 208 mg/dL — ABNORMAL HIGH (ref 70–99)
Glucose-Capillary: 195 mg/dL — ABNORMAL HIGH (ref 70–99)
Glucose-Capillary: 297 mg/dL — ABNORMAL HIGH (ref 70–99)
Glucose-Capillary: 280 mg/dL — ABNORMAL HIGH (ref 70–99)
Glucose-Capillary: 311 mg/dL — ABNORMAL HIGH (ref 70–99)

## 2011-08-31 LAB — BASIC METABOLIC PANEL
Chloride: 102 mEq/L (ref 96–112)
GFR calc Af Amer: >90 mL/min (ref >90)
Potassium: 3.6 mEq/L (ref 3.5–5.1)

## 2011-08-31 LAB — HEMOGLOBIN A1C: Mean Plasma Glucose: 269 mg/dL — ABNORMAL HIGH (ref <117)

## 2011-08-31 LAB — CBC
Hemoglobin: 12.1 g/dL — ABNORMAL LOW (ref 13.0–17.0)
RBC: 3.97 MIL/uL — ABNORMAL LOW (ref 4.22–5.81)

## 2011-08-31 LAB — MAGNESIUM: Magnesium: 2 mg/dL (ref 1.5–2.5)

## 2011-08-31 LAB — VANCOMYCIN, TROUGH: Vancomycin Tr: 8.9 ug/mL — ABNORMAL LOW (ref 10.0–20.0)

## 2011-08-31 MED ORDER — LISINOPRIL 10 MG PO TABS
10.0000 mg | ORAL_TABLET | Freq: Every day | ORAL | Status: DC
  Administered 2011-09-01: 10 mg via ORAL
  Filled 2011-08-31: qty 1

## 2011-08-31 MED ORDER — VANCOMYCIN HCL IN DEXTROSE 1-5 GM/200ML-% IV SOLN
1000.0000 mg | Freq: Three times a day (TID) | INTRAVENOUS | Status: DC
Start: 2011-08-31 — End: 2011-08-31
  Filled 2011-08-31 (×3): qty 200

## 2011-08-31 MED ORDER — METFORMIN HCL 500 MG PO TABS
500.0000 mg | ORAL_TABLET | Freq: Two times a day (BID) | ORAL | Status: DC
  Administered 2011-08-31 – 2011-09-01 (×2): 500 mg via ORAL
  Filled 2011-08-31 (×3): qty 1

## 2011-08-31 MED ORDER — INSULIN GLARGINE 100 UNIT/ML ~~LOC~~ SOLN
5.0000 [IU] | Freq: Every day | SUBCUTANEOUS | Status: DC
  Administered 2011-08-31: 5 [IU] via SUBCUTANEOUS
  Filled 2011-08-31 (×3): qty 3

## 2011-08-31 NOTE — Plan of Care (Signed)
Problem: Food- and Nutrition-Related Knowledge Deficit (NB-1.1) Goal: Nutrition education Formal process to instruct or train a patient/client in a skill or to impart knowledge to help patients/clients voluntarily manage or modify food choices and eating behavior to maintain or improve health.  Outcome: Adequate for Discharge Discussed DM management for new onset DM Type II.  Reviewed handout with pt- "Nutrition Therapy for Type 2 DM."  Discussed goals and barriers to compliance.  Pt verbalized understand of all information presented.  Watching DM videos at time of visit.  Plans to participate in OP education.  Encouraged pt to request return visit prior to d/c if questions present.  Contact information provided.  Expect good compliance.

## 2011-08-31 NOTE — Progress Notes (Signed)
Subjective: No events during night. Feeling better but weak.  Relates feeling tired and  Objective: Vital signs Filed Vitals:   08/30/11 2204 08/30/11 2219 08/31/11 0558 08/31/11 0825  BP:  163/91 171/95   Pulse:  85 82   Temp:  98.2 F (36.8 C) 99.2 F (37.3 C)   TempSrc:      Resp:  20 18   Height:      Weight:   86.41 kg (190 lb 8 oz)   SpO2: 95% 95% 95% 92%   Weight change:  Last BM Date: 08/28/11  Intake/Output from previous day: 12/04 0701 - 12/05 0700 In: 900 [P.O.:480; I.V.:120; IV Piggyback:300] Out: 1925 [Urine:1925] Total I/O In: 360 [P.O.:360] Out: 200 [Urine:200]   Physical Exam: General: Alert, awake, oriented x3, in no acute distress. Less ill appearing HEENT: No bruits, no goiter. PERRL Heart: Regular rate and rhythm, without murmurs, rubs, gallops. Lungs:Normal effort. Breath sounds with mild rhonchi otherwise clear to auscultation bilaterally. Abdomen: Soft, nontender, nondistended, positive bowel sounds. Extremities: No clubbing cyanosis or edema with positive pedal pulses. Neuro: Grossly intact, nonfocal. Speech clear.     Lab Results: Basic Metabolic Panel:  Basename 08/31/11 0429 08/30/11 0510  NA 137 139  K 3.6 3.3*  CL 102 101  CO2 30 30  GLUCOSE 278* 228*  BUN 10 10  CREATININE 0.68 0.66  CALCIUM 8.9 9.0  MG 2.0 --  PHOS -- --   Liver Function Tests:  Basename 08/29/11 0455  AST 25  ALT 34  ALKPHOS 75  BILITOT 0.3  PROT 5.9*  ALBUMIN 2.5*   No results found for this basename: LIPASE:2,AMYLASE:2 in the last 72 hours No results found for this basename: AMMONIA:2 in the last 72 hours CBC:  Basename 08/31/11 0429 08/30/11 0510  WBC 12.8* 13.2*  NEUTROABS -- --  HGB 12.1* 12.9*  HCT 35.8* 37.2*  MCV 90.2 91.2  PLT 243 237   Cardiac Enzymes: No results found for this basename: CKTOTAL:3,CKMB:3,CKMBINDEX:3,TROPONINI:3 in the last 72 hours BNP: No results found for this basename: POCBNP:3 in the last 72  hours D-Dimer: No results found for this basename: DDIMER:2 in the last 72 hours CBG:  Basename 08/31/11 0724 08/30/11 2103 08/30/11 1748 08/30/11 1126 08/30/11 0728 08/29/11 2114  GLUCAP 297* 195* 208* 257* 219* 272*   Hemoglobin A1C: No results found for this basename: HGBA1C in the last 72 hours Fasting Lipid Panel: No results found for this basename: CHOL,HDL,LDLCALC,TRIG,CHOLHDL,LDLDIRECT in the last 72 hours Thyroid Function Tests: No results found for this basename: TSH,T4TOTAL,FREET4,T3FREE,THYROIDAB in the last 72 hours Anemia Panel: No results found for this basename: VITAMINB12,FOLATE,FERRITIN,TIBC,IRON,RETICCTPCT in the last 72 hours Coagulation: No results found for this basename: LABPROT:2,INR:2 in the last 72 hours Urine Drug Screen: Drugs of Abuse  No results found for this basename: labopia, cocainscrnur, labbenz, amphetmu, thcu, labbarb    Alcohol Level: No results found for this basename: ETH:2 in the last 72 hours Urinalysis:  Misc. Labs:  Recent Results (from the past 240 hour(s))  CULTURE, RESPIRATORY     Status: Normal   Collection Time   08/27/11  4:47 AM      Component Value Range Status Comment   Specimen Description ASPIRATE TRACHEAL   Final    Special Requests NONE   Final    Gram Stain     Final    Value: ABUNDANT WBC PRESENT, PREDOMINANTLY PMN     RARE SQUAMOUS EPITHELIAL CELLS PRESENT     FEW GRAM POSITIVE RODS  FEW GRAM POSITIVE COCCI IN PAIRS     RARE GRAM NEGATIVE RODS   Culture Non-Pathogenic Oropharyngeal-type Flora Isolated.   Final    Report Status 08/29/2011 FINAL   Final   PATHOLOGIST SMEAR REVIEW     Status: Normal   Collection Time   08/28/11  4:49 AM      Component Value Range Status Comment   Tech Review Reviewed by H. Hollice Espy, M.D.   Final     Studies/Results: Dg Chest 2 View  08/30/2011  *RADIOLOGY REPORT*  Clinical Data: Follow up pneumonia  CHEST - 2 VIEW  Comparison: 08/28/2011  Findings: Heart size is normal.   No pleural effusion or pulmonary edema.  There is no airspace consolidation.  Coarsened interstitial markings are noted bilaterally, similar to previous exam.  Review of the visualized osseous structures is unremarkable.  IMPRESSION:  1.  No evidence for pneumonia. 2.  Chronic interstitial coarsening.  Original Report Authenticated By: Rosealee Albee, M.D.    Medications: Scheduled Meds:   . albuterol  2.5 mg Nebulization TID   And  . ipratropium  0.5 mg Nebulization TID  . dextromethorphan-guaiFENesin  1 tablet Oral BID  . fluticasone  2 spray Each Nare Daily  . heparin  5,000 Units Subcutaneous Q8H  . insulin aspart  0-15 Units Subcutaneous TID WC  . insulin aspart  0-5 Units Subcutaneous QHS  . insulin glargine  7 Units Subcutaneous QHS  . lisinopril  5 mg Oral Daily  . multivitamins ther. w/minerals  1 tablet Oral Daily  . oseltamivir  75 mg Oral BID  . potassium chloride  40 mEq Oral Q4H while awake  . sodium chloride  1,000 mL Intravenous Once  . DISCONTD: piperacillin-tazobactam (ZOSYN)  IV  3.375 g Intravenous Q8H  . DISCONTD: potassium chloride  40 mEq Oral Once  . DISCONTD: vancomycin  1,250 mg Intravenous Q12H  . DISCONTD: vancomycin  1,000 mg Intravenous Q8H   Continuous Infusions:   . sodium chloride 20 mL/hr at 08/29/11 2300   PRN Meds:.sodium chloride, acetaminophen, albuterol, albuterol, diazepam, HYDROcodone-homatropine, ibuprofen, ipratropium  Assessment/Plan:  Principal Problem:   1. *Pneumonia/ Influenza A Bronchiolitis: repeat chest xray questionable for pneumonia. Appreciate assistance pulmonology. WC trending down slowly. Continue tamiflu Will discontinue Vanc and Zosyn.  Sputum culture 11/30 neg. Continues with cough but slightly improved  Active Problems:  2 Acute respiratory failure: secondary to problem #1. Sats 93% on room air. Sats 99% on nasal cannula. Will check sats with exertion 3. DM likely type II. CBG range 195-297. HgA1c 8.4 Will start  lantus 7 units. Will consider metformin at discharge. Appetite slightly improved today. Will change diet to carb modified.  Requesting nutritional consult. Will request OP Diab education. Continue inpatient diabetes coordinator. Will need assistance getting established with PCP. Will request case management.  4. Tree-in-bud appearance on CT. F/U CT in 3 months.  5.Hx tobacco abuse with ? COPD. No wheezing. Continue nebulizer.  6. Hypokalemia.Repleted but remains lower end of range. Likely related to #3.  Will monitor. mag level 2.0. repeat bmet in am  7. HTN: no hx. Started ACE yesterday mostly for renal protection and problem #3. Control remains only fair. Will increase lisinipril  8. Dispo. Home likely 12/6.       LOS: 5 days   [facility] M 08/31/2011, 11:02 AM  -Agree with above. -Was started on metformin. And will titrate down his Lantus. Continue current management. We'll followup with his primary care Dr. to titrate  medications as needed.

## 2011-08-31 NOTE — Progress Notes (Signed)
ANTIBIOTIC CONSULT NOTE - FOLLOW UP  Pharmacy Consult for Vancomycin/Zosyn Indication: pneumonia  No Known Allergies  Patient Measurements: Height: 5\' 11"  (180.3 cm) Weight: 190 lb 8 oz (86.41 kg) IBW/kg (Calculated) : 75.3  Vital Signs: Temp: 99.2 F (37.3 C) (12/05 0558) BP: 171/95 mmHg (12/05 0558) Pulse Rate: 82  (12/05 0558) Intake/Output from previous day: 12/04 0701 - 12/05 0700 In: 900 [P.O.:480; I.V.:120; IV Piggyback:300] Out: 1925 [Urine:1925] Intake/Output from this shift: Total I/O In: 360 [P.O.:360] Out: 200 [Urine:200]  Labs:  [facility] 08/31/11 0429 08/30/11 0510 08/29/11 0455  WBC 12.8* 13.2* 15.4*  HGB 12.1* 12.9* 12.6*  PLT 243 237 242  LABCREA -- -- --  CREATININE 0.68 0.66 0.66   Estimated Creatinine Clearance: 112.4 ml/min (by C-G formula based on Cr of 0.68).  Basename 08/31/11 0800  VANCOTROUGH 8.9*  VANCOPEAK --  Drue Dun --  GENTTROUGH --  GENTPEAK --  GENTRANDOM --  TOBRATROUGH --  TOBRAPEAK --  TOBRARND --  AMIKACINPEAK --  AMIKACINTROU --  AMIKACIN --     Microbiology: Recent Results (from the past 720 hour(s))  CULTURE, RESPIRATORY     Status: Normal   Collection Time   08/27/11  4:47 AM      Component Value Range Status Comment   Specimen Description ASPIRATE TRACHEAL   Final    Special Requests NONE   Final    Gram Stain     Final    Value: ABUNDANT WBC PRESENT, PREDOMINANTLY PMN     RARE SQUAMOUS EPITHELIAL CELLS PRESENT     FEW GRAM POSITIVE RODS     FEW GRAM POSITIVE COCCI IN PAIRS     RARE GRAM NEGATIVE RODS   Culture Non-Pathogenic Oropharyngeal-type Flora Isolated.   Final    Report Status 08/29/2011 FINAL   Final   PATHOLOGIST SMEAR REVIEW     Status: Normal   Collection Time   08/28/11  4:49 AM      Component Value Range Status Comment   Tech Review Reviewed by H. Hollice Espy, M.D.   Final     Anti-infectives     Start     Dose/Rate Route Frequency Ordered Stop   08/28/11 2000  piperacillin-tazobactam  (ZOSYN) IVPB 3.375 g       3.375 g 12.5 mL/hr over 240 Minutes Intravenous 3 times per day 08/28/11 1557     08/28/11 2000   vancomycin (VANCOCIN) 1,250 mg in sodium chloride 0.9 % 250 mL IVPB        1,250 mg 166.7 mL/hr over 90 Minutes Intravenous Every 12 hours 08/28/11 1707     08/28/11 1200   vancomycin (VANCOCIN) IVPB 1000 mg/200 mL premix  Status:  Discontinued        1,000 mg 200 mL/hr over 60 Minutes Intravenous Every 12 hours 08/28/11 1052 08/28/11 1706   08/28/11 1200   piperacillin-tazobactam (ZOSYN) IVPB 3.375 g  Status:  Discontinued        3.375 g 12.5 mL/hr over 240 Minutes Intravenous 4 times per day 08/28/11 1052 08/28/11 1557   08/27/11 2200   cefTRIAXone (ROCEPHIN) 1 g in dextrose 5 % 50 mL IVPB  Status:  Discontinued        1 g 100 mL/hr over 30 Minutes Intravenous Daily at bedtime 08/27/11 0344 08/28/11 1052   08/27/11 1245   azithromycin (ZITHROMAX) 500 mg in dextrose 5 % 250 mL IVPB  Status:  Discontinued        500 mg 250 mL/hr over 60  Minutes Intravenous Every 24 hours 08/27/11 1245 08/28/11 1052   08/27/11 1000   oseltamivir (TAMIFLU) capsule 75 mg        75 mg Oral 2 times daily 08/27/11 0341     08/27/11 1000   azithromycin (ZITHROMAX) tablet 500 mg  Status:  Discontinued        500 mg Oral Daily 08/27/11 0344 08/27/11 1245   08/26/11 2315   cefTRIAXone (ROCEPHIN) 1 g in dextrose 5 % 50 mL IVPB        1 g 100 mL/hr over 30 Minutes Intravenous  Once 08/26/11 2305 08/27/11 0001   08/26/11 2315   azithromycin (ZITHROMAX) 500 mg in dextrose 5 % 250 mL IVPB        500 mg 250 mL/hr over 60 Minutes Intravenous  Once 08/26/11 2305 08/27/11 0119          Assessment: 55 yo M, Day #4 Zosyn/Vanc for PNA. Vanc trough subtherapuetic on current dose (1250mg  IV q12h). Scr stable.  Goal of Therapy:  Vancomycin trough level 15-20 mcg/ml  Plan:  Increase Vancomycin to 1g IV q8h. No change to Zosyn - dose ok. Recheck Vancomcyin trough at steady state if not  d/c'd by then and adjust if necessary. MD note indicates may d/c abx soon if pt continues to improve.  Gwen Her 08/31/2011,9:21 AM

## 2011-08-31 NOTE — Progress Notes (Signed)
Pt ambulated entire hallway,>5ft without O2. O2 sats 94-95%.

## 2011-08-31 NOTE — Progress Notes (Signed)
Inpatient Diabetes Program Recommendations  AACE/ADA: New Consensus Statement on Inpatient Glycemic Control (2009)  Target Ranges:  Prepandial:   less than 140 mg/dL      Peak postprandial:   less than 180 mg/dL (1-2 hours)      Critically ill patients:  140 - 180 mg/dL   Reason for Visit: hyperglycemia  Inpatient Diabetes Program Recommendations Insulin - Basal: Increase Lantus to 10 unitsQHS Insulin - Meal Coverage: Add meal coverage insulin - Novolog 3 units tidwc HgbA1C: Check HgbA1C to assess glycemic control Outpatient Referral: Received OP Diabetes Ed consult order   Note:

## 2011-09-01 LAB — GLUCOSE, CAPILLARY

## 2011-09-01 MED ORDER — POTASSIUM CHLORIDE CRYS ER 20 MEQ PO TBCR
40.0000 meq | EXTENDED_RELEASE_TABLET | Freq: Once | ORAL | Status: AC
  Administered 2011-09-01: 40 meq via ORAL
  Filled 2011-09-01: qty 2

## 2011-09-01 MED ORDER — INSULIN PEN STARTER KIT
1.0000 | Freq: Once | Status: AC
  Administered 2011-09-01: 1
  Filled 2011-09-01: qty 1

## 2011-09-01 MED ORDER — OSELTAMIVIR PHOSPHATE 75 MG PO CAPS: 75.0000 mg | ORAL_CAPSULE | Freq: Two times a day (BID) | ORAL | Status: AC

## 2011-09-01 MED ORDER — INSULIN GLARGINE 100 UNIT/ML ~~LOC~~ SOLN: 10.0000 [IU] | Freq: Every day | SUBCUTANEOUS | Status: DC

## 2011-09-01 MED ORDER — LIVING WELL WITH DIABETES BOOK
Freq: Once | Status: AC
  Administered 2011-09-01: 13:00:00
  Filled 2011-09-01: qty 1

## 2011-09-01 MED ORDER — METFORMIN HCL 500 MG PO TABS: 500.0000 mg | ORAL_TABLET | Freq: Two times a day (BID) | ORAL | Status: DC

## 2011-09-01 MED ORDER — LISINOPRIL 10 MG PO TABS: 10.0000 mg | ORAL_TABLET | Freq: Every day | ORAL | Status: DC

## 2011-09-01 MED ORDER — HYDRALAZINE HCL 20 MG/ML IJ SOLN
10.0000 mg | Freq: Once | INTRAMUSCULAR | Status: AC
  Administered 2011-09-01: 10 mg via INTRAVENOUS
  Filled 2011-09-01: qty 1

## 2011-09-01 NOTE — Progress Notes (Signed)
Patient's spouse came to take patient home by private car.

## 2011-09-01 NOTE — Progress Notes (Signed)
Patient was taken home by his spouse in a private car Katleen Carraway, Bari Mantis

## 2011-09-01 NOTE — Progress Notes (Signed)
HgbA1C - 11.0 Discussed importance of reducing HgbA1C to > 7. Answered questions regarding insulin pen administration.  To be d/c'd on Lantus 10 units QHS via insulin pen.  Pt gave insulin injection this am.  Discussed hypoglycemia, blood sugar monitoring, diet, and followup with PCP to manage diabetes.  Pt voices understanding and seems motivated to make changes to improve blood sugars. Willing to attend OP Diabetes Educ classes and has appt scheduled with PCP.  Given meter prescription.  To be d/c'd this afternoon to home.

## 2011-09-01 NOTE — Progress Notes (Signed)
Patient BP 184/91 HR 85, patient denies any distress Dr Verlon Setting notified order given for hydralazine 10mg  IV. Rechecked BP after giving hydralazine 138/77. F/U with plan of care.

## 2011-09-01 NOTE — Discharge Summary (Signed)
Physician Discharge Summary  Patient ID: [name] MRN: [id number] DOB/AGE: [birth year] 55 y.o.  Admit date: 08/26/2011 Discharge date: 09/01/2011  Primary Care Physician:  Lolita Patella, MD   Discharge Diagnoses:    Present on Admission:  .Acute respiratory failure .Pneumonia .Diabetes mellitus  Current Discharge Medication List    START taking these medications   Details  insulin glargine (LANTUS) 100 UNIT/ML injection Inject 10 Units into the skin at bedtime. Qty: 10 mL, Refills: 0    lisinopril (PRINIVIL,ZESTRIL) 10 MG tablet Take 1 tablet (10 mg total) by mouth daily. Qty: 30 tablet, Refills: 0    metFORMIN (GLUCOPHAGE) 500 MG tablet Take 1 tablet (500 mg total) by mouth 2 (two) times daily with a meal. Qty: 60 tablet, Refills: 0    oseltamivir (TAMIFLU) 75 MG capsule Take 1 capsule (75 mg total) by mouth 2 (two) times daily. Qty: 9 capsule, Refills: 0      CONTINUE these medications which have NOT CHANGED   Details  albuterol (PROVENTIL HFA;VENTOLIN HFA) 108 (90 BASE) MCG/ACT inhaler Inhale 1-2 puffs into the lungs every 6 (six) hours as needed for wheezing. Qty: 1 Inhaler, Refills: 0    dextromethorphan-guaiFENesin (MUCINEX DM) 30-600 MG per 12 hr tablet Take 1 tablet by mouth every 12 (twelve) hours.      fluticasone (FLONASE) 50 MCG/ACT nasal spray Place 2 sprays into the nose daily. Qty: 16 g, Refills: 0    HYDROcodone-homatropine (HYCODAN) 5-1.5 MG/5ML syrup Take 5 mLs by mouth every 6 (six) hours as needed for cough or pain. Qty: 120 mL, Refills: 0    ibuprofen (ADVIL,MOTRIN) 200 MG tablet Take 600 mg by mouth every 6 (six) hours as needed. For pain.     Multiple Vitamins-Minerals (MULTIVITAMINS THER. W/MINERALS) TABS Take 1 tablet by mouth daily.           Disposition and Follow-up: medically stable and ready for discharge. [facility] up with [facility]  Consults: Pulmonary Physical Exam General: Alert, awake, oriented x3, in no acute  distress. Less ill appearing  HEENT: No bruits, no goiter. PERRL  Heart: Regular rate and rhythm, without murmurs, rubs, gallops.  Lungs:Normal effort. Breath sounds with mild rhonchi otherwise clear to auscultation bilaterally.  Abdomen: Soft, nontender, nondistended, positive bowel sounds.  Extremities: No clubbing cyanosis or edema with positive pedal pulses.  Neuro: Grossly intact, nonfocal. Speech clear.      Significant Diagnostic Studies:  Dg Chest 2 View  08/26/2011  *RADIOLOGY REPORT*  Clinical Data: Cough and generalized chest pain; hypoxia.  History of smoking.  CHEST - 2 VIEW  Comparison: Chest radiograph performed 08/24/2011  Findings: The lungs are well-aerated.  Mild peribronchial thickening is noted. Persistent mild left basilar airspace opacity may reflect atelectasis or possibly mild pneumonia; this appears slightly worsened from the prior study.  There is no evidence of pleural effusion or pneumothorax.  The heart is normal in size; the mediastinal contour is within normal limits.  No acute osseous abnormalities are seen.  IMPRESSION: Slightly worsened mild left basilar airspace opacity may reflect atelectasis or possibly mild pneumonia.  Original Report Authenticated By: Tonia Ghent, M.D.   Ct Angio Chest W/cm &/or Wo Cm  08/26/2011  *RADIOLOGY REPORT*  Clinical Data:  Hypoxia and tachycardia are  CT ANGIOGRAPHY CHEST WITH CONTRAST  Technique:  Multidetector CT imaging of the chest was performed using the standard protocol during bolus administration of intravenous contrast.  Multiplanar CT image reconstructions including MIPs were obtained to evaluate the vascular  anatomy.  Contrast: 80mL OMNIPAQUE IOHEXOL 300 MG/ML IV SOLN  Comparison:  Chest radiograph 08/26/2011  Findings:  Technically satisfactory evaluation of the pulmonary arteries., no focal filling defect is identified within the pulmonary arteries to suggest pulmonary embolism.  The thoracic aorta is normal in  caliber and enhancement.  Negative for dissection.  Heart size is within normal limits.  There is left ventricular wall thickening.  Faint coronary artery atherosclerotic calcifications noted.  Negative for pleural or pericardial effusion.  There are scattered small mediastinal lymph nodes.  No lymphadenopathy is identified in the chest.  Diffuse bilateral bronchial wall thickening and mild bronchiectasis, with "tree-in-bud" micronodularity in the right middle lobe, right lower lobe and left lower lobe.  Findings suggest an atypical or fungal infection, as often seen with Mycobacterium avium intracellulare.  No focal airspace disease or pneumothorax.  Visualized thoracic spine vertebral bodies are normal in height and alignment.  A small meningioma noted in the T6 vertebral body.  No acute fracture is identified.  Fatty infiltration of the liver is noted.  Review of the MIP images confirms the above findings.  IMPRESSION:  1.  Negative for pulmonary embolism. 2.  Diffuse bronchial wall thickening and mild bronchiectasis with extensive"tree-in-bud" micronodularity of the right middle lobe and both lower lobes, suggestive of fungal or atypical infection, and typically seen with Mycobacterium avium intracellulare. 3.  Fatty infiltration of the liver.  Original Report Authenticated By: Britta Mccreedy, M.D.    Labs Reviewed  CBC - Abnormal; Notable for the following:    WBC 17.3 (*)    All other components within normal limits  DIFFERENTIAL - Abnormal; Notable for the following:    Neutrophils Relative 78 (*)    Lymphocytes Relative 10 (*)    Neutro Abs 13.5 (*)    Monocytes Absolute 2.1 (*)    All other components within normal limits  BASIC METABOLIC PANEL - Abnormal; Notable for the following:    Sodium 127 (*)    Potassium 3.4 (*)    Chloride 88 (*)    Glucose, Bld 556 (*)    All other components within normal limits  CARDIAC PANEL(CRET KIN+CKTOT+MB+TROPI) - Abnormal; Notable for the following:     Total CK 913 (*)    CK, MB 8.2 (*)    All other components within normal limits  URINALYSIS, ROUTINE W REFLEX MICROSCOPIC - Abnormal; Notable for the following:    Specific Gravity, Urine 1.045 (*)    Glucose, UA >1000 (*)    Hgb urine dipstick LARGE (*)    Ketones, ur TRACE (*)    All other components within normal limits  BLOOD GAS, ARTERIAL - Abnormal; Notable for the following:    pH, Arterial 7.481 (*)    pO2, Arterial 58.1 (*)    Bicarbonate 26.8 (*)    Acid-Base Excess 3.8 (*)    All other components within normal limits  D-DIMER, QUANTITATIVE - Abnormal; Notable for the following:    D-Dimer, Quant 1.26 (*)    All other components within normal limits  CBC - Abnormal; Notable for the following:    WBC 18.4 (*)    All other components within normal limits  BASIC METABOLIC PANEL - Abnormal; Notable for the following:    Sodium 132 (*)    Chloride 95 (*)    Glucose, Bld 324 (*)    All other components within normal limits  BLOOD GAS, ARTERIAL - Abnormal; Notable for the following:    pO2, Arterial 73.9 (*)  Bicarbonate 26.2 (*)    Acid-Base Excess 2.4 (*)    All other components within normal limits  GLUCOSE, CAPILLARY - Abnormal; Notable for the following:    Glucose-Capillary 297 (*)    All other components within normal limits  INFLUENZA PANEL BY PCR - Abnormal; Notable for the following:    Influenza A By PCR POSITIVE (*)    All other components within normal limits  GLUCOSE, CAPILLARY - Abnormal; Notable for the following:    Glucose-Capillary 303 (*)    All other components within normal limits  GLUCOSE, CAPILLARY - Abnormal; Notable for the following:    Glucose-Capillary 284 (*)    All other components within normal limits  GLUCOSE, CAPILLARY - Abnormal; Notable for the following:    Glucose-Capillary 298 (*)    All other components within normal limits  GLUCOSE, CAPILLARY - Abnormal; Notable for the following:    Glucose-Capillary 308 (*)    All other  components within normal limits  GLUCOSE, CAPILLARY - Abnormal; Notable for the following:    Glucose-Capillary 275 (*)    All other components within normal limits  GLUCOSE, CAPILLARY - Abnormal; Notable for the following:    Glucose-Capillary 121 (*)    All other components within normal limits  GLUCOSE, CAPILLARY - Abnormal; Notable for the following:    Glucose-Capillary 476 (*)    All other components within normal limits  GLUCOSE, CAPILLARY - Abnormal; Notable for the following:    Glucose-Capillary 414 (*)    All other components within normal limits  GLUCOSE, CAPILLARY - Abnormal; Notable for the following:    Glucose-Capillary 234 (*)    All other components within normal limits  DIFFERENTIAL - Abnormal; Notable for the following:    Neutro Abs 12.2 (*)    Lymphs Abs 4.2 (*)    Monocytes Absolute 1.1 (*)    All other components within normal limits  CBC - Abnormal; Notable for the following:    WBC 17.7 (*)    All other components within normal limits  COMPREHENSIVE METABOLIC PANEL - Abnormal; Notable for the following:    Potassium 3.4 (*)    Glucose, Bld 217 (*)    Albumin 2.9 (*)    AST 44 (*)    All other components within normal limits  GLUCOSE, CAPILLARY - Abnormal; Notable for the following:    Glucose-Capillary 272 (*)    All other components within normal limits  GLUCOSE, CAPILLARY - Abnormal; Notable for the following:    Glucose-Capillary 283 (*)    All other components within normal limits  GLUCOSE, CAPILLARY - Abnormal; Notable for the following:    Glucose-Capillary 202 (*)    All other components within normal limits  GLUCOSE, CAPILLARY - Abnormal; Notable for the following:    Glucose-Capillary 235 (*)    All other components within normal limits  GLUCOSE, CAPILLARY - Abnormal; Notable for the following:    Glucose-Capillary 306 (*)    All other components within normal limits  GLUCOSE, CAPILLARY - Abnormal; Notable for the following:     Glucose-Capillary 239 (*)    All other components within normal limits  CBC - Abnormal; Notable for the following:    WBC 15.4 (*)    RBC 4.14 (*)    Hemoglobin 12.6 (*)    HCT 37.4 (*)    All other components within normal limits  COMPREHENSIVE METABOLIC PANEL - Abnormal; Notable for the following:    Potassium 3.4 (*)    Glucose,  Bld 286 (*)    Total Protein 5.9 (*)    Albumin 2.5 (*)    All other components within normal limits  GLUCOSE, CAPILLARY - Abnormal; Notable for the following:    Glucose-Capillary 326 (*)    All other components within normal limits  GLUCOSE, CAPILLARY - Abnormal; Notable for the following:    Glucose-Capillary 255 (*)    All other components within normal limits  GLUCOSE, CAPILLARY - Abnormal; Notable for the following:    Glucose-Capillary 234 (*)    All other components within normal limits  BASIC METABOLIC PANEL - Abnormal; Notable for the following:    Potassium 3.3 (*)    Glucose, Bld 228 (*)    All other components within normal limits  CBC - Abnormal; Notable for the following:    WBC 13.2 (*)    RBC 4.08 (*)    Hemoglobin 12.9 (*)    HCT 37.2 (*)    All other components within normal limits  GLUCOSE, CAPILLARY - Abnormal; Notable for the following:    Glucose-Capillary 299 (*)    All other components within normal limits  GLUCOSE, CAPILLARY - Abnormal; Notable for the following:    Glucose-Capillary 272 (*)    All other components within normal limits  GLUCOSE, CAPILLARY - Abnormal; Notable for the following:    Glucose-Capillary 219 (*)    All other components within normal limits  GLUCOSE, CAPILLARY - Abnormal; Notable for the following:    Glucose-Capillary 257 (*)    All other components within normal limits  BASIC METABOLIC PANEL - Abnormal; Notable for the following:    Glucose, Bld 278 (*)    All other components within normal limits  CBC - Abnormal; Notable for the following:    WBC 12.8 (*)    RBC 3.97 (*)    Hemoglobin  12.1 (*)    HCT 35.8 (*)    All other components within normal limits  VANCOMYCIN, TROUGH - Abnormal; Notable for the following:    Vancomycin Tr 8.9 (*)    All other components within normal limits  GLUCOSE, CAPILLARY - Abnormal; Notable for the following:    Glucose-Capillary 208 (*)    All other components within normal limits  GLUCOSE, CAPILLARY - Abnormal; Notable for the following:    Glucose-Capillary 195 (*)    All other components within normal limits  GLUCOSE, CAPILLARY - Abnormal; Notable for the following:    Glucose-Capillary 297 (*)    All other components within normal limits  HEMOGLOBIN A1C - Abnormal; Notable for the following:    Hemoglobin A1C 11.0 (*)    Mean Plasma Glucose 269 (*)    All other components within normal limits  GLUCOSE, CAPILLARY - Abnormal; Notable for the following:    Glucose-Capillary 280 (*)    All other components within normal limits  HEMOGLOBIN A1C - Abnormal; Notable for the following:    Hemoglobin A1C 11.0 (*)    Mean Plasma Glucose 269 (*)    All other components within normal limits  GLUCOSE, CAPILLARY - Abnormal; Notable for the following:    Glucose-Capillary 268 (*)    All other components within normal limits  GLUCOSE, CAPILLARY - Abnormal; Notable for the following:    Glucose-Capillary 311 (*)    All other components within normal limits  GLUCOSE, CAPILLARY - Abnormal; Notable for the following:    Glucose-Capillary 177 (*)    All other components within normal limits  GLUCOSE, CAPILLARY - Abnormal; Notable for  the following:    Glucose-Capillary 175 (*)    All other components within normal limits  URINE MICROSCOPIC-ADD ON  MAGNESIUM  PHOSPHORUS  CULTURE, RESPIRATORY  HIV ANTIBODY (ROUTINE TESTING)  PATHOLOGIST SMEAR REVIEW  MAGNESIUM  POCT CBG MONITORING  CULTURE, SPUTUM-ASSESSMENT        Dg Chest 2 View  08/30/2011  *RADIOLOGY REPORT*  Clinical Data: Follow up pneumonia  CHEST - 2 VIEW  Comparison:  08/28/2011  Findings: Heart size is normal.  No pleural effusion or pulmonary edema.  There is no airspace consolidation.  Coarsened interstitial markings are noted bilaterally, similar to previous exam.  Review of the visualized osseous structures is unremarkable.  IMPRESSION:  1.  No evidence for pneumonia. 2.  Chronic interstitial coarsening.  Original Report Authenticated By: Rosealee Albee, M.D.   Dg Chest 2 View  08/26/2011  *RADIOLOGY REPORT*  Clinical Data: Cough and generalized chest pain; hypoxia.  History of smoking.  CHEST - 2 VIEW  Comparison: Chest radiograph performed 08/24/2011  Findings: The lungs are well-aerated.  Mild peribronchial thickening is noted. Persistent mild left basilar airspace opacity may reflect atelectasis or possibly mild pneumonia; this appears slightly worsened from the prior study.  There is no evidence of pleural effusion or pneumothorax.  The heart is normal in size; the mediastinal contour is within normal limits.  No acute osseous abnormalities are seen.  IMPRESSION: Slightly worsened mild left basilar airspace opacity may reflect atelectasis or possibly mild pneumonia.  Original Report Authenticated By: Tonia Ghent, M.D.   Dg Chest 2 View  08/24/2011  *RADIOLOGY REPORT*  Clinical Data: Cough, congestion, fever  CHEST - 2 VIEW  Comparison: None  Findings: Upper-normal size of cardiac silhouette. Mediastinal contours and pulmonary vascularity normal. Minimal bronchitic changes and left basilar atelectasis. Lungs otherwise clear. No pleural effusion or pneumothorax. Bones unremarkable.  IMPRESSION: Minimal bronchitic changes and left basilar atelectasis.  Original Report Authenticated By: Lollie Marrow, M.D.   Ct Angio Chest W/cm &/or Wo Cm  08/26/2011  *RADIOLOGY REPORT*  Clinical Data:  Hypoxia and tachycardia are  CT ANGIOGRAPHY CHEST WITH CONTRAST  Technique:  Multidetector CT imaging of the chest was performed using the standard protocol during bolus  administration of intravenous contrast.  Multiplanar CT image reconstructions including MIPs were obtained to evaluate the vascular anatomy.  Contrast: 80mL OMNIPAQUE IOHEXOL 300 MG/ML IV SOLN  Comparison:  Chest radiograph 08/26/2011  Findings:  Technically satisfactory evaluation of the pulmonary arteries., no focal filling defect is identified within the pulmonary arteries to suggest pulmonary embolism.  The thoracic aorta is normal in caliber and enhancement.  Negative for dissection.  Heart size is within normal limits.  There is left ventricular wall thickening.  Faint coronary artery atherosclerotic calcifications noted.  Negative for pleural or pericardial effusion.  There are scattered small mediastinal lymph nodes.  No lymphadenopathy is identified in the chest.  Diffuse bilateral bronchial wall thickening and mild bronchiectasis, with "tree-in-bud" micronodularity in the right middle lobe, right lower lobe and left lower lobe.  Findings suggest an atypical or fungal infection, as often seen with Mycobacterium avium intracellulare.  No focal airspace disease or pneumothorax.  Visualized thoracic spine vertebral bodies are normal in height and alignment.  A small meningioma noted in the T6 vertebral body.  No acute fracture is identified.  Fatty infiltration of the liver is noted.  Review of the MIP images confirms the above findings.  IMPRESSION:  1.  Negative for pulmonary embolism. 2.  Diffuse  bronchial wall thickening and mild bronchiectasis with extensive"tree-in-bud" micronodularity of the right middle lobe and both lower lobes, suggestive of fungal or atypical infection, and typically seen with Mycobacterium avium intracellulare. 3.  Fatty infiltration of the liver.  Original Report Authenticated By: Britta Mccreedy, M.D.   Dg Chest Port 1 View  08/28/2011  *RADIOLOGY REPORT*  Clinical Data: Shortness of breath.  PORTABLE CHEST - 1 VIEW  Comparison: Chest radiograph and CTA of the chest performed  08/26/2011  Findings: No endotracheal tube is seen.  The lungs are well-aerated.  Mild left basilar airspace opacity raises concern for pneumonia.  Mild vascular congestion is seen. There is no evidence of pleural effusion or pneumothorax.  The cardiomediastinal silhouette is borderline normal in size.  No acute osseous abnormalities are seen.  IMPRESSION:  1.  Mild left basilar airspace opacity raises concern for pneumonia. 2.  Mild vascular congestion seen; interstitial edema could conceivably have a similar appearance.  Original Report Authenticated By: Tonia Ghent, M.D.       Brief H and P: For complete details please refer to admission H and P, but in brief  HPI: Pt is a 55 y/o M with PMHx of kidney stones and DM who was initially seen 2 days ago at the urgent care clinic at [facility] due to sore throat, myalgias, dry cough, wheezing and SOB. Pt was given flonase for nasasl congestion, prednisone 40 mg for 2 days and albuterol. He comes today to [facility] and reports that the symptoms did not improve and had worsening of cough now with greenish sputum production, more wheezing, chest tightness and SOB. Pt was found to have hypoxia on RA and was started on 2LNC and ABG showed 7.48/36/58/26. Pt was then started on Vent mask 50%. WBC was 17 and CTA showed no PE but tree-in-bud nodules and bronchial wall thickening.     Hospital Course:  Active Hospital Problems  Diagnoses Date Noted   . Pneumonia 08/27/2011   . Diabetes mellitus 08/27/2011     Resolved Hospital Problems  Diagnoses Date Noted Date Resolved  . Acute respiratory failure 08/27/2011 08/31/2011    Principal Problem:  *Pneumonia/ Influenza A Bronchiolitis Initially pt thought to have pna and treatment started. On admission chest xray and ct chest as above.Vanc and Zosyn given 2 days. WC came down slowly and dyspnea improved.  repeat chest xray questionable for pneumonia. Seen by pulmonology Influenza A positive Started on  tamiflu and will  complete 10 days. Sputum culture 11/30 neg. Continues with cough but slightly improved  Active Problems:  2 Acute respiratory failure: secondary to problem #1. Sats with exertion 95% Resolved at discharge 3. DM likely type II. CBG range 195-297. HgA1c 11.0 New diagnosis.Started on lantus5 with only fair control. Will be discharged on Lantus 10 and metformin.pt been instructed on  diet to carb modified.Has had inpatient diabetic teaching. Is scheduled for OP diabetic teaching. Has demonstrated his ability to check bld sugar and administer insulin. 4. Tree-in-bud appearance on CT. F/U CT in 3 months.  5.Hx tobacco abuse with ? COPD. No wheezing. Continue nebulizer.  6. Hypokalemia.Repleted but remains lower end of range. Likely related to #3. Will monitor. mag level 2.0. repeat bmet in am K 3.4 at discharge . Will replete. Will need bmet in 1 week.  7. HTN: no hx. Started ACE and problem #3. Controled  Time spent on Discharge: 50 minutes  Signed: Gwenyth Bender 09/01/2011, 11:16 AM

## 2012-04-20 ENCOUNTER — Emergency Department (HOSPITAL_COMMUNITY)
Admission: EM | Admit: 2012-04-20 | Discharge: 2012-04-20 | Disposition: A | Discharge status: Home / Self Care | Payer: Medicaid Other | Attending: Emergency Medicine | Admitting: Emergency Medicine

## 2012-04-20 ENCOUNTER — Emergency Department (HOSPITAL_COMMUNITY): Payer: Medicaid Other

## 2012-04-20 ENCOUNTER — Encounter (HOSPITAL_COMMUNITY): Payer: Self-pay | Admitting: *Deleted

## 2012-04-20 DIAGNOSIS — N200 Calculus of kidney: Secondary | ICD-10-CM | POA: Insufficient documentation

## 2012-04-20 DIAGNOSIS — Z794 Long term (current) use of insulin: Secondary | ICD-10-CM | POA: Insufficient documentation

## 2012-04-20 DIAGNOSIS — E119 Type 2 diabetes mellitus without complications: Secondary | ICD-10-CM | POA: Insufficient documentation

## 2012-04-20 DIAGNOSIS — Z79899 Other long term (current) drug therapy: Secondary | ICD-10-CM | POA: Insufficient documentation

## 2012-04-20 LAB — URINALYSIS, ROUTINE W REFLEX MICROSCOPIC
Bilirubin Urine: NEGATIVE
Nitrite: NEGATIVE
Specific Gravity, Urine: 1.022 (ref 1.005–1.030)
Urobilinogen, UA: 0.2 mg/dL (ref 0.0–1.0)
pH: 5 (ref 5.0–8.0)

## 2012-04-20 LAB — URINE MICROSCOPIC-ADD ON

## 2012-04-20 IMAGING — CR DG ABDOMEN 1V
2 series · 2 of 2 positions shown · non-contrast
Comparison: [DATE]

CLINICAL DATA: Right flank pain

ABDOMEN - 1 VIEW

[t abdomen supine (1 of 2)]
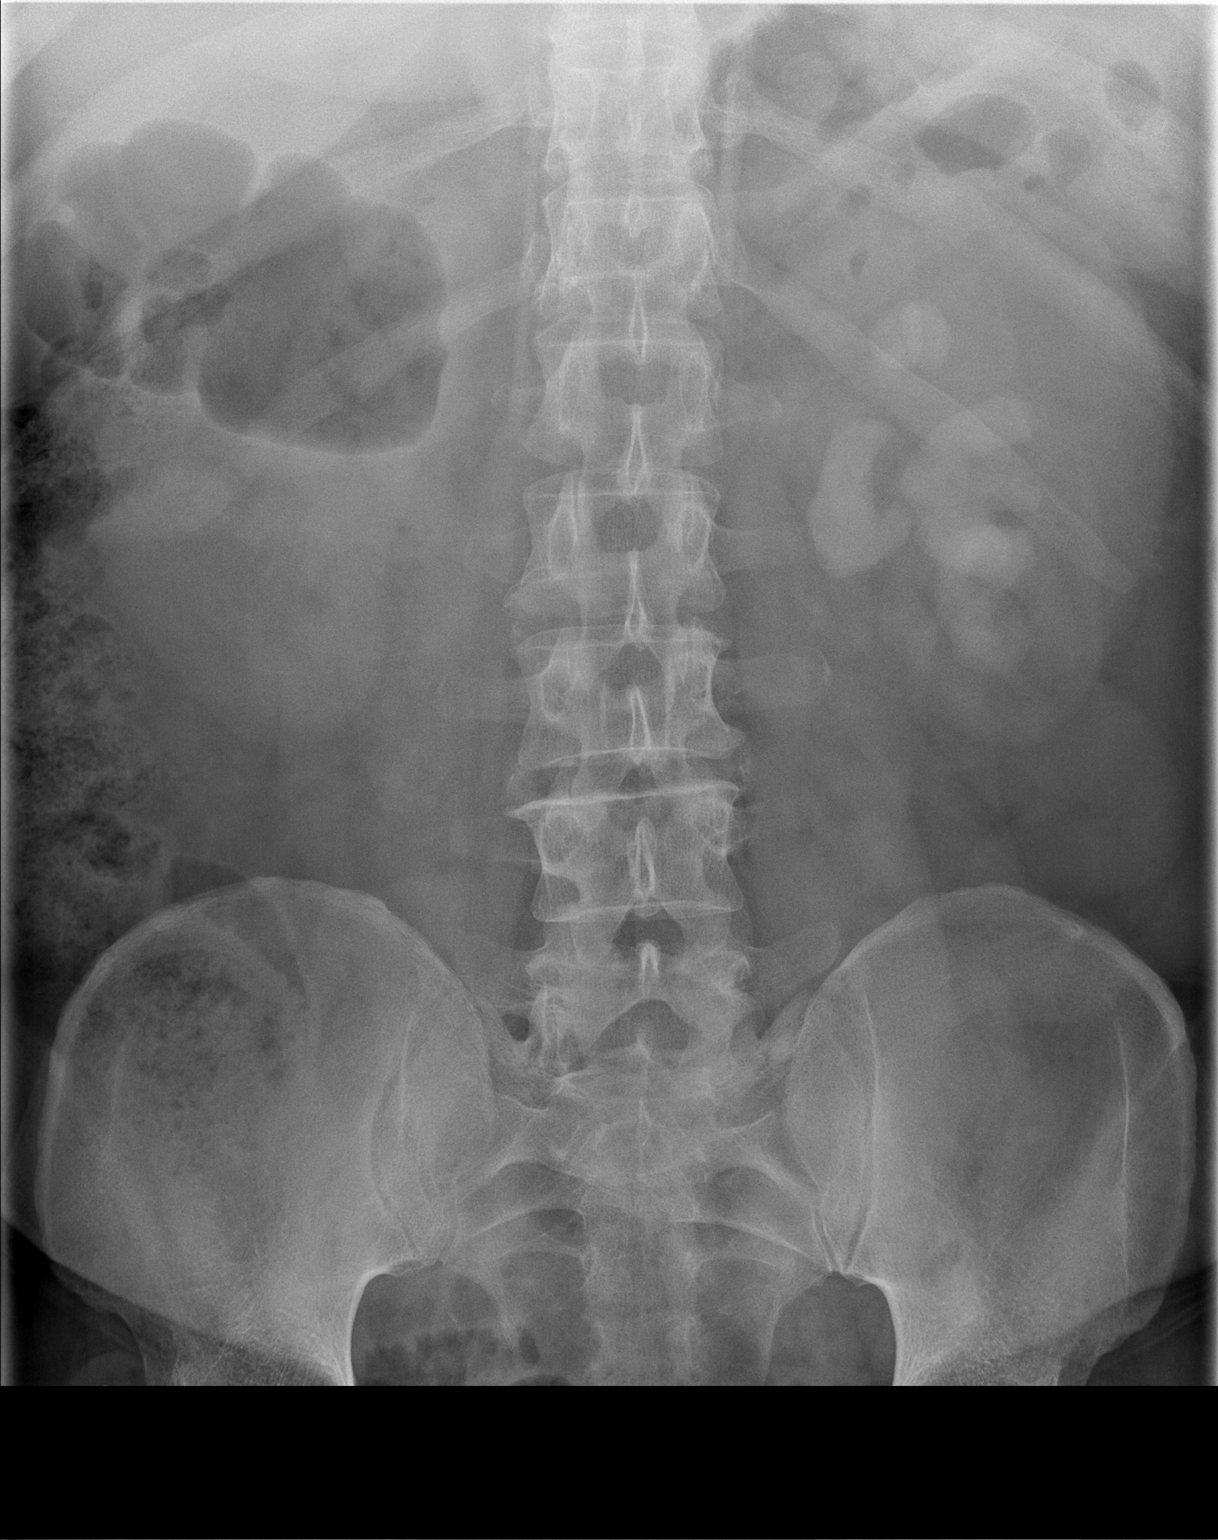

[t abdomen supine (2 of 2)]
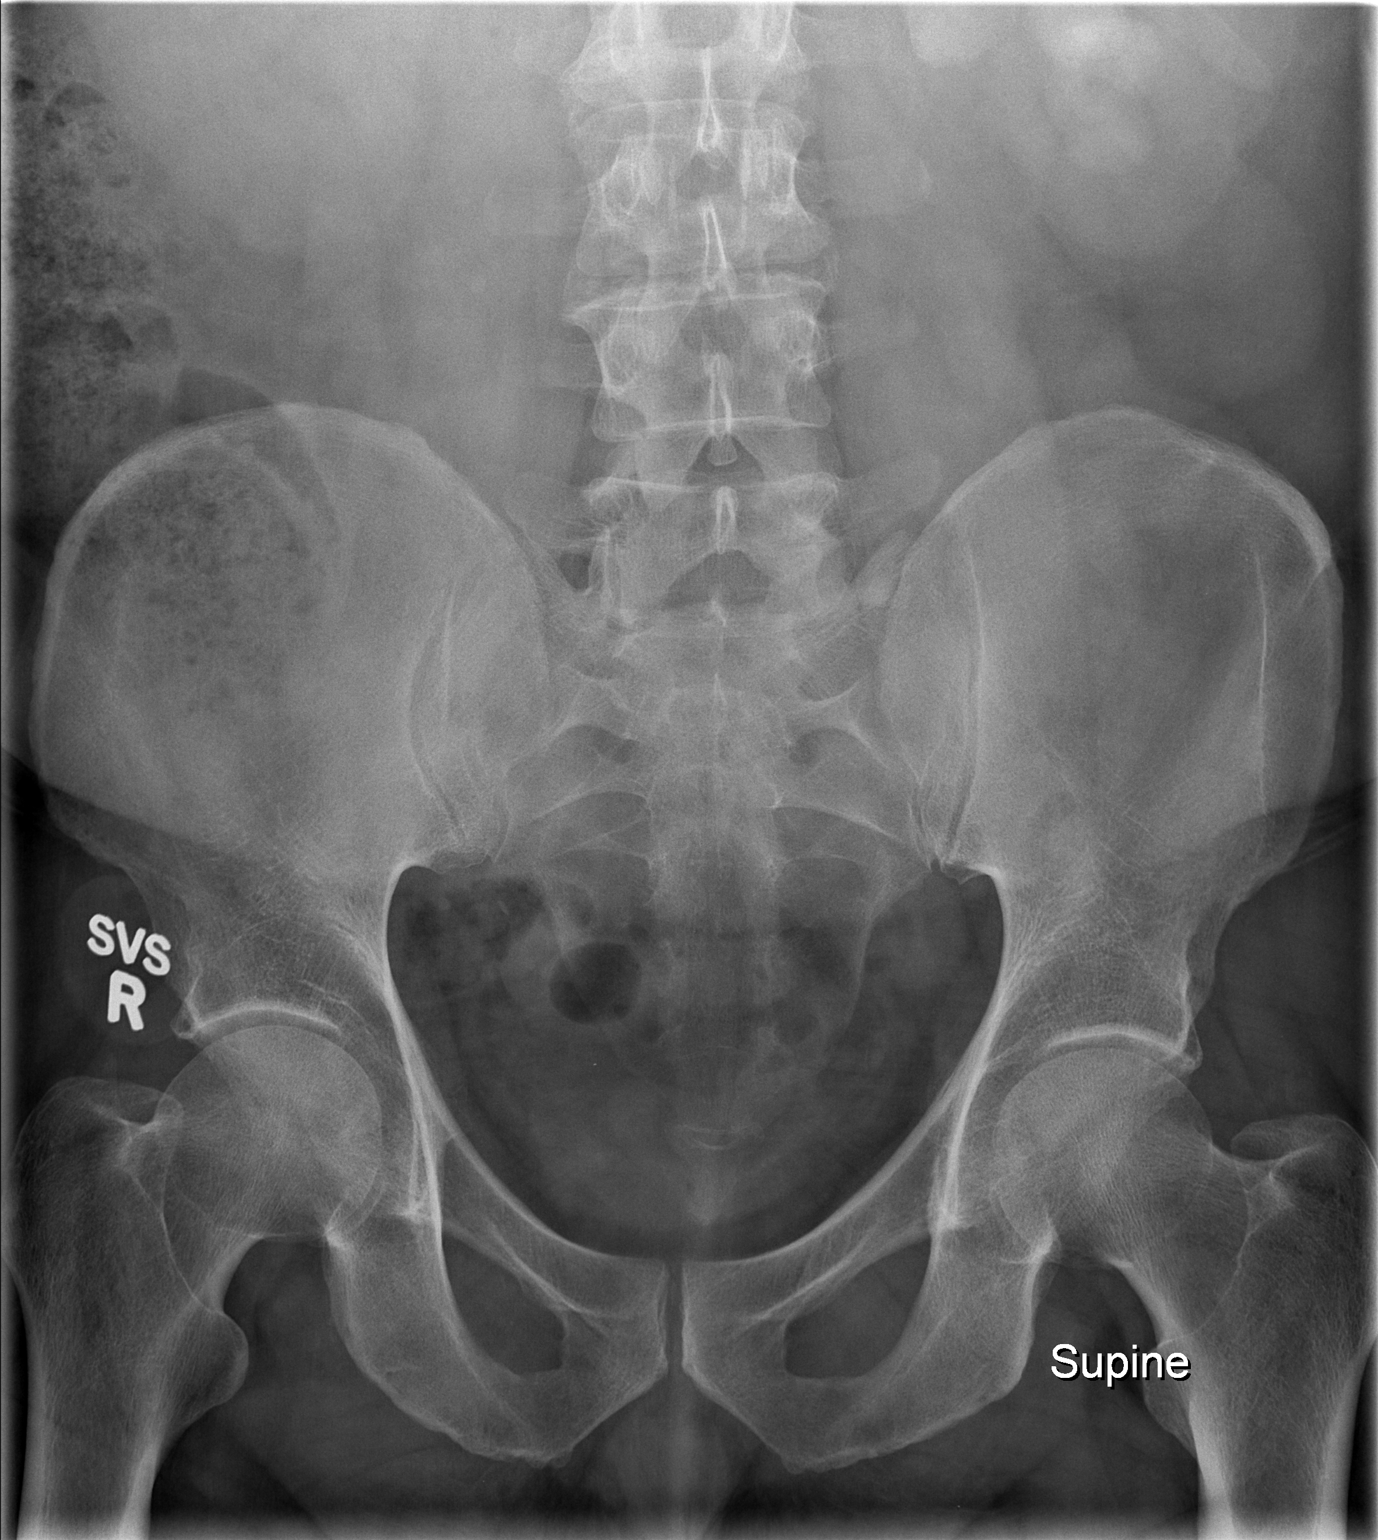

[2 of 2 positions shown; findings below may reference images not displayed]

FINDINGS: There is a large left sided staghorn calculus identified.

There is a small round ossific density adjacent to the L3-4 disc
space on the right which may represent a mid ureteral calculus.

No abnormal pelvic calcifications.

The bowel gas pattern appears normal.
IMPRESSION: 1.  Cannot rule out right mid ureteral calculus.
2.  Left sided staghorn calculus.

## 2012-04-20 MED ORDER — KETOROLAC TROMETHAMINE 30 MG/ML IJ SOLN
15.0000 mg | Freq: Once | INTRAMUSCULAR | Status: AC
  Administered 2012-04-20: 15 mg via INTRAVENOUS
  Filled 2012-04-20: qty 1

## 2012-04-20 MED ORDER — FENTANYL CITRATE 0.05 MG/ML IJ SOLN
INTRAMUSCULAR | Status: AC
  Administered 2012-04-20: 50 ug via INTRAVENOUS
  Filled 2012-04-20: qty 2

## 2012-04-20 MED ORDER — OXYCODONE-ACETAMINOPHEN 5-325 MG PO TABS: 1.0000 | ORAL_TABLET | Freq: Four times a day (QID) | ORAL | Status: AC | PRN

## 2012-04-20 MED ORDER — KETOROLAC TROMETHAMINE 30 MG/ML IJ SOLN
30.0000 mg | Freq: Once | INTRAMUSCULAR | Status: AC
  Administered 2012-04-20: 30 mg via INTRAVENOUS
  Filled 2012-04-20: qty 1

## 2012-04-20 MED ORDER — SODIUM CHLORIDE 0.9 % IV BOLUS (SEPSIS)
500.0000 mL | Freq: Once | INTRAVENOUS | Status: AC
  Administered 2012-04-20: 500 mL via INTRAVENOUS

## 2012-04-20 MED ORDER — HYDROMORPHONE HCL PF 1 MG/ML IJ SOLN
1.0000 mg | Freq: Once | INTRAMUSCULAR | Status: AC
  Administered 2012-04-20: 1 mg via INTRAVENOUS
  Filled 2012-04-20: qty 1

## 2012-04-20 MED ORDER — TAMSULOSIN HCL 0.4 MG PO CAPS: 0.4000 mg | ORAL_CAPSULE | Freq: Every day | ORAL | Status: DC

## 2012-04-20 MED ORDER — ONDANSETRON HCL 4 MG/2ML IJ SOLN
INTRAMUSCULAR | Status: AC
  Administered 2012-04-20: 4 mg via INTRAVENOUS
  Filled 2012-04-20: qty 2

## 2012-04-20 MED ORDER — ONDANSETRON HCL 4 MG/2ML IJ SOLN
4.0000 mg | Freq: Once | INTRAMUSCULAR | Status: AC
  Administered 2012-04-20: 4 mg via INTRAVENOUS

## 2012-04-20 MED ORDER — SODIUM CHLORIDE 0.9 % IV SOLN
INTRAVENOUS | Status: DC
  Administered 2012-04-20: 16:00:00 via INTRAVENOUS

## 2012-04-20 MED ORDER — ONDANSETRON HCL 8 MG PO TABS: 8.0000 mg | ORAL_TABLET | ORAL | Status: AC | PRN

## 2012-04-20 MED ORDER — FENTANYL CITRATE 0.05 MG/ML IJ SOLN
50.0000 ug | Freq: Once | INTRAMUSCULAR | Status: AC
  Administered 2012-04-20: 50 ug via INTRAVENOUS

## 2012-04-20 NOTE — ED Notes (Signed)
Pt actively vomiting upon arrival to acute room. Pt medicated for n/v and pain per protocol.

## 2012-04-20 NOTE — ED Notes (Signed)
Pt reports right side flank pain with blood in urine. Hx of kidney stones. Reports nausea/vomiting. Pain 10/10.

## 2012-04-22 NOTE — ED Provider Notes (Signed)
History     CSN: [id number]  Arrival date & time 04/20/12  1307   First MD Initiated Contact with Patient 04/20/12 1417      Chief Complaint  Patient presents with  . Flank Pain    (Consider location/radiation/quality/duration/timing/severity/associated sxs/prior treatment) HPI..  flank pain with radiation to right lower quadrant for 24 hours.  Patient has a long history of kidney stones requiring multiple procedures. This feels like typical kidney stone pain. No fever, sweats, chills. Review of systems positive for hematuria.  Nothing makes pain better or worse. Symptoms are moderate to severe  Past Medical History  Diagnosis Date  . Kidney stone   . Diabetes mellitus     Past Surgical History  Procedure Date  . Lithotripsy   . Uretral stent   . No past surgeries     No family history on file.  History  Substance Use Topics  . Smoking status: Never Smoker   . Smokeless tobacco: Not on file  . Alcohol Use: 1.2 oz/week    2 Cans of beer per week     occasional      Review of Systems  All other systems reviewed and are negative.    Allergies  Review of patient's allergies indicates no known allergies.  Home Medications   Current Outpatient Rx  Name Route Sig Dispense Refill  . ALBUTEROL SULFATE HFA 108 (90 BASE) MCG/ACT IN AERS Inhalation Inhale 1-2 puffs into the lungs every 6 (six) hours as needed for wheezing. 1 Inhaler 0  . IBUPROFEN 200 MG PO TABS Oral Take 1,000 mg by mouth every 8 (eight) hours as needed. For pain.    . INSULIN GLARGINE 100 UNIT/ML [place] SOLN Subcutaneous Inject 20 Units into the skin at bedtime.    Marland Kitchen LISINOPRIL 10 MG PO TABS Oral Take 1 tablet (10 mg total) by mouth daily. 30 tablet 0  . METFORMIN HCL 1000 MG PO TABS Oral Take 1,000 mg by mouth 2 (two) times daily with a meal.    . THERA M PLUS PO TABS Oral Take 2 tablets by mouth daily.     Marland Kitchen ONDANSETRON HCL 8 MG PO TABS Oral Take 1 tablet (8 mg total) by mouth every 4 (four) hours as  needed for nausea. 10 tablet 0  . OXYCODONE-ACETAMINOPHEN 5-325 MG PO TABS Oral Take 1-2 tablets by mouth every 6 (six) hours as needed for pain. 30 tablet 0  . TAMSULOSIN HCL 0.4 MG PO CAPS Oral Take 1 capsule (0.4 mg total) by mouth daily. 30 capsule 0    BP 143/65  Pulse 80  Temp 98.7 F (37.1 C) (Oral)  Resp 16  Ht 5\' 11"  (1.803 m)  Wt 197 lb (89.359 kg)  BMI 27.48 kg/m2  SpO2 97%  Physical Exam  Nursing note and vitals reviewed. Constitutional: He is oriented to person, place, and time. He appears well-developed and well-nourished.  HENT:  Head: Normocephalic and atraumatic.  Eyes: Conjunctivae and EOM are normal. Pupils are equal, round, and reactive to light.  Neck: Normal range of motion. Neck supple.  Cardiovascular: Normal rate and regular rhythm.   Pulmonary/Chest: Effort normal and breath sounds normal.  Abdominal: Soft. Bowel sounds are normal.  Genitourinary:       Tender right flank  Musculoskeletal: Normal range of motion.  Neurological: He is alert and oriented to person, place, and time.  Skin: Skin is warm and dry.  Psychiatric: He has a normal mood and affect.    ED  Course  Procedures (including critical care time)  Labs Reviewed  URINALYSIS, ROUTINE W REFLEX MICROSCOPIC - Abnormal; Notable for the following:    Color, Urine AMBER (*)  BIOCHEMICALS MAY BE AFFECTED BY COLOR   APPearance TURBID (*)     Hgb urine dipstick LARGE (*)     Ketones, ur TRACE (*)     Protein, ur 100 (*)     Leukocytes, UA MODERATE (*)     All other components within normal limits  URINE MICROSCOPIC-ADD ON - Abnormal; Notable for the following:    Bacteria, UA MANY (*)     All other components within normal limits  LAB REPORT - SCANNED   Dg Abd 1 View  04/20/2012  *RADIOLOGY REPORT*  Clinical Data: Right flank pain  ABDOMEN - 1 VIEW  Comparison: 01/10/2011  Findings: There is a large left sided staghorn calculus identified.  There is a small round ossific density adjacent  to the L3-4 disc space on the right which may represent a mid ureteral calculus.  No abnormal pelvic calcifications.  The bowel gas pattern appears normal.  IMPRESSION:  1.  Cannot rule out right mid ureteral calculus. 2.  Left sided staghorn calculus.  Original Report Authenticated By: Rosealee Albee, M.D.     1. Kidney stone on right side       MDM  Patient has long-standing kidney stones. Will avoid CT scan secondary radiation risk. Urinalysis shows hemoglobin. Patient feeling better after pain management. He will follow up with urologist. Discharge home with medication for pain, nausea, Flomax.        Donnetta Hutching, MD 04/22/12 [phone]

## 2013-03-03 ENCOUNTER — Emergency Department (HOSPITAL_COMMUNITY)
Admission: EM | Admit: 2013-03-03 | Discharge: 2013-03-03 | Disposition: A | Discharge status: Home / Self Care | Payer: BC Managed Care – PPO | Attending: Emergency Medicine | Admitting: Emergency Medicine

## 2013-03-03 ENCOUNTER — Emergency Department (HOSPITAL_COMMUNITY): Payer: BC Managed Care – PPO

## 2013-03-03 ENCOUNTER — Encounter (HOSPITAL_COMMUNITY): Payer: Self-pay | Admitting: *Deleted

## 2013-03-03 DIAGNOSIS — R319 Hematuria, unspecified: Secondary | ICD-10-CM | POA: Insufficient documentation

## 2013-03-03 DIAGNOSIS — E119 Type 2 diabetes mellitus without complications: Secondary | ICD-10-CM | POA: Insufficient documentation

## 2013-03-03 DIAGNOSIS — Z79899 Other long term (current) drug therapy: Secondary | ICD-10-CM | POA: Insufficient documentation

## 2013-03-03 DIAGNOSIS — Z7982 Long term (current) use of aspirin: Secondary | ICD-10-CM | POA: Insufficient documentation

## 2013-03-03 DIAGNOSIS — R112 Nausea with vomiting, unspecified: Secondary | ICD-10-CM | POA: Insufficient documentation

## 2013-03-03 DIAGNOSIS — N1330 Unspecified hydronephrosis: Secondary | ICD-10-CM

## 2013-03-03 DIAGNOSIS — R109 Unspecified abdominal pain: Secondary | ICD-10-CM

## 2013-03-03 DIAGNOSIS — N200 Calculus of kidney: Secondary | ICD-10-CM | POA: Insufficient documentation

## 2013-03-03 DIAGNOSIS — K5900 Constipation, unspecified: Secondary | ICD-10-CM | POA: Insufficient documentation

## 2013-03-03 DIAGNOSIS — Z794 Long term (current) use of insulin: Secondary | ICD-10-CM | POA: Insufficient documentation

## 2013-03-03 LAB — CBC WITH DIFFERENTIAL/PLATELET
Basophils Relative: 0 % (ref 0–1)
Eosinophils Absolute: 0.7 10*3/uL (ref 0.0–0.7)
Eosinophils Relative: 6 % — ABNORMAL HIGH (ref 0–5)
Hemoglobin: 13.1 g/dL (ref 13.0–17.0)
MCH: 31 pg (ref 26.0–34.0)
MCHC: 34.1 g/dL (ref 30.0–36.0)
Monocytes Absolute: 1 10*3/uL (ref 0.1–1.0)
Monocytes Relative: 8 % (ref 3–12)
Neutrophils Relative %: 53 % (ref 43–77)

## 2013-03-03 LAB — COMPREHENSIVE METABOLIC PANEL
Albumin: 3.7 g/dL (ref 3.5–5.2)
BUN: 16 mg/dL (ref 6–23)
Calcium: 9.9 mg/dL (ref 8.4–10.5)
Creatinine, Ser: 0.79 mg/dL (ref 0.50–1.35)
Total Protein: 7.1 g/dL (ref 6.0–8.3)

## 2013-03-03 LAB — URINALYSIS, ROUTINE W REFLEX MICROSCOPIC
Glucose, UA: NEGATIVE mg/dL
Nitrite: NEGATIVE
pH: 5 (ref 5.0–8.0)

## 2013-03-03 LAB — URINE MICROSCOPIC-ADD ON

## 2013-03-03 IMAGING — CT CT ABD-PELV W/ CM
1 of 3 series · 13 of 32 positions shown, 18 images · IV contrast (OMNIPAQUE 300)
Comparison: CT of the abdomen and pelvis performed [DATE]

CLINICAL DATA: Left flank pain, nausea, vomiting and hematuria.
History of renal stones.  Mild leukocytosis.

CT ABDOMEN AND PELVIS WITH CONTRAST
TECHNIQUE: Multidetector CT imaging of the abdomen and pelvis was
performed following the standard protocol during bolus
administration of intravenous contrast.
Contrast: 100 mL of Omnipaque 300 IV contrast

[Series 2: abd/pel with · axial · 0.84mm/px · z∈[-486,-56]mm · 13 of 98 slices shown, 18 images]
[im 6/98  soft-tissue]
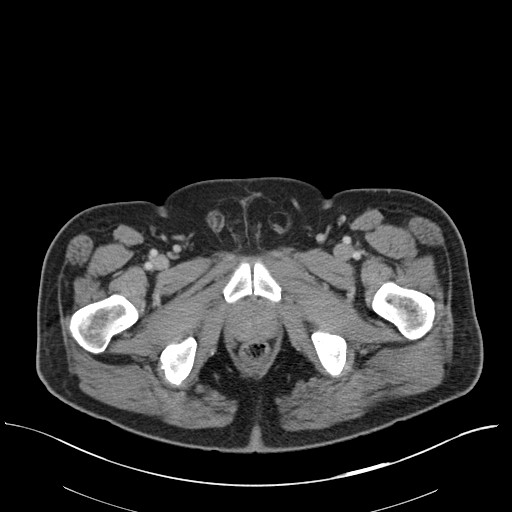
[im 6/98  bone]
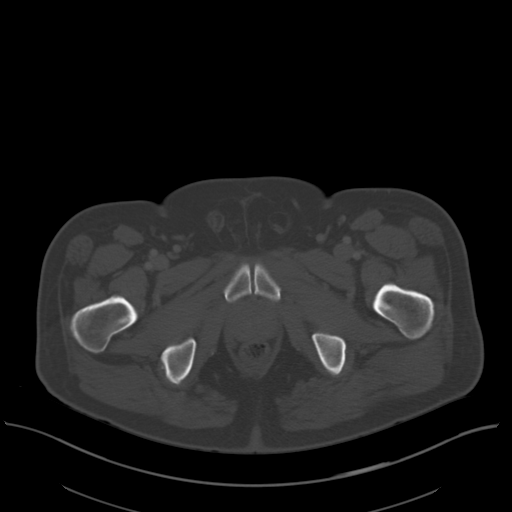
[im 16/98  soft-tissue]
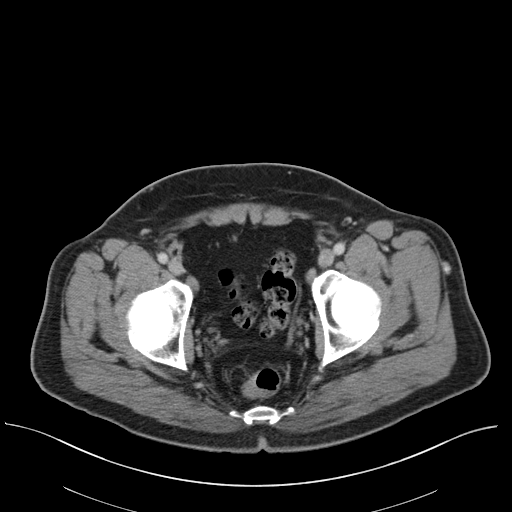
[im 21/98  soft-tissue]
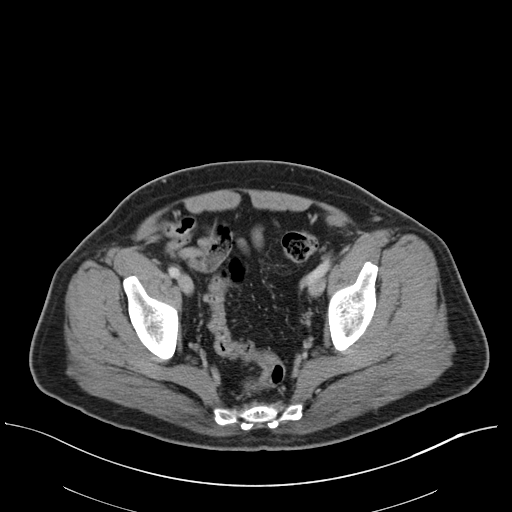
[im 31/98  soft-tissue]
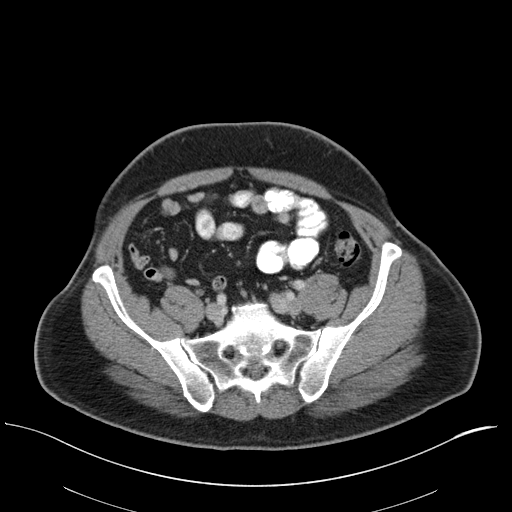
[im 36/98  soft-tissue]
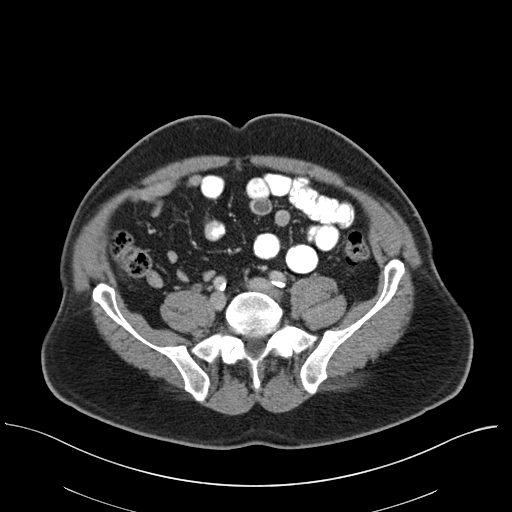
[im 46/98  soft-tissue]
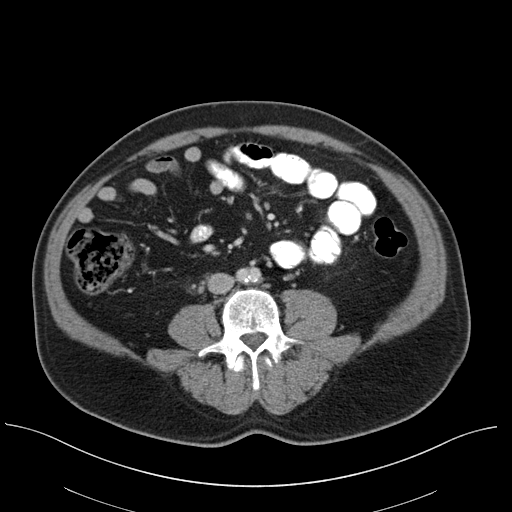
[im 52/98  soft-tissue]
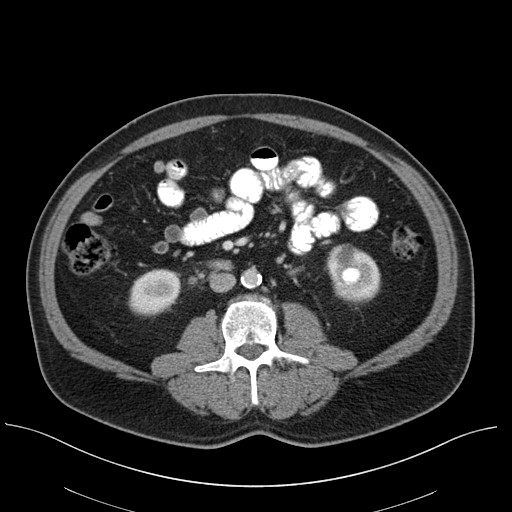
[im 62/98  soft-tissue]
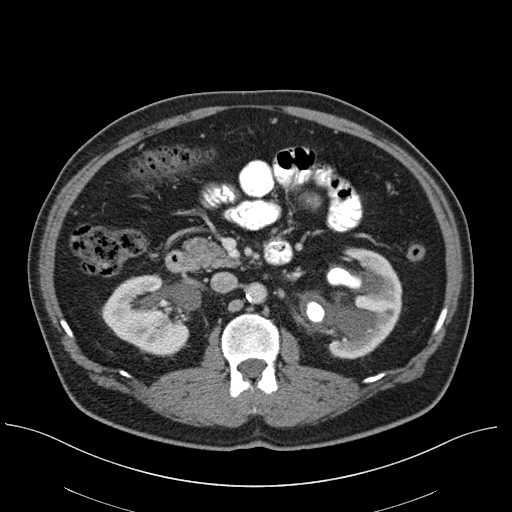
[im 67/98  soft-tissue]
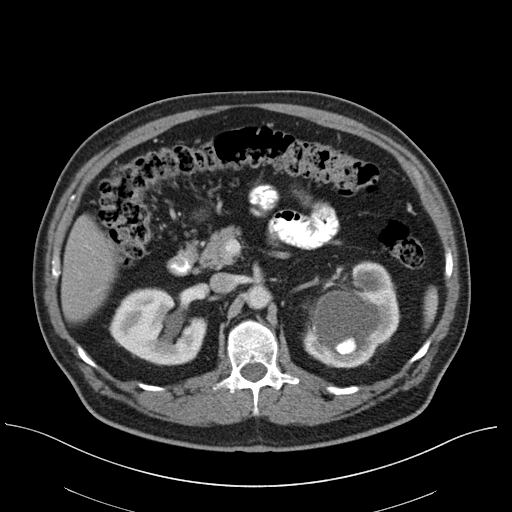
[im 67/98  bone]
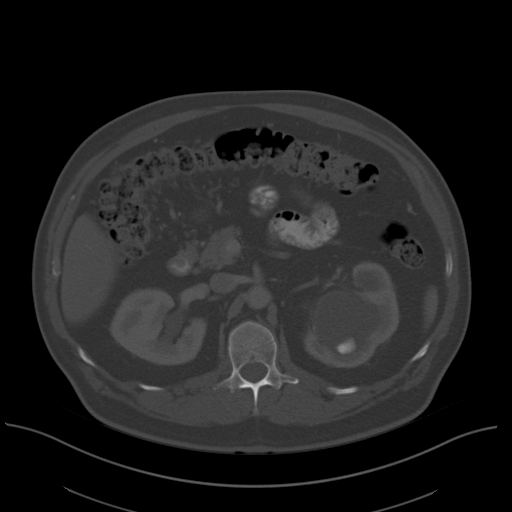
[im 77/98  soft-tissue]
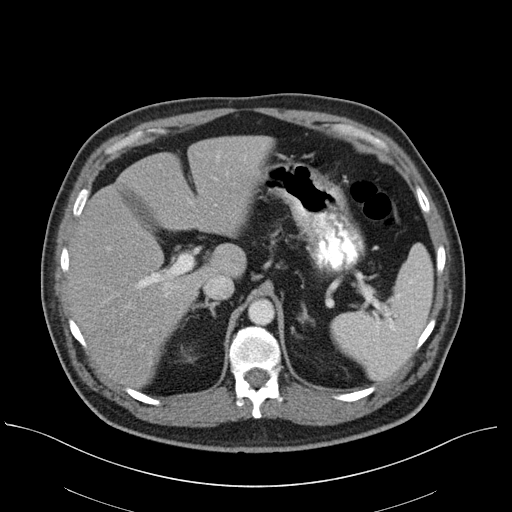
[im 77/98  lung]
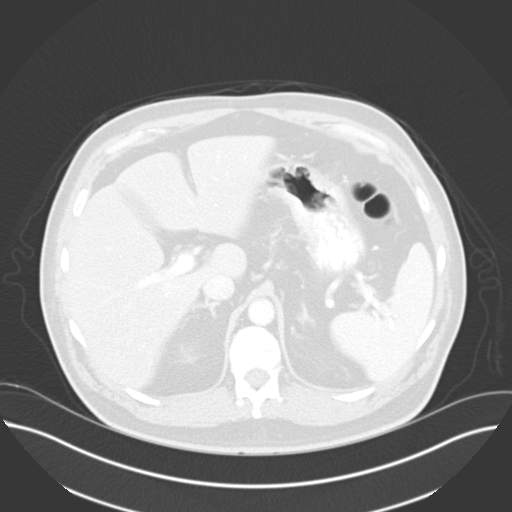
[im 82/98  soft-tissue]
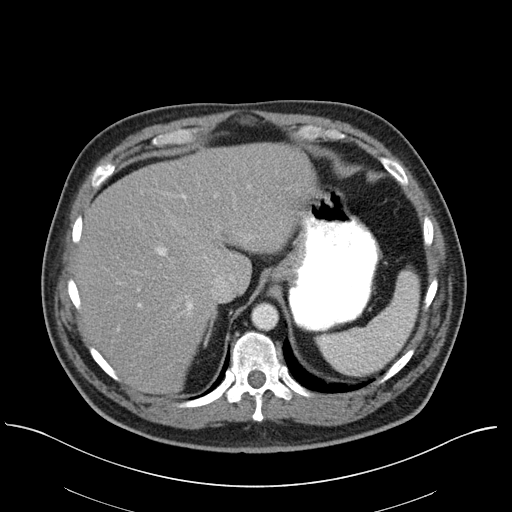
[im 82/98  lung]
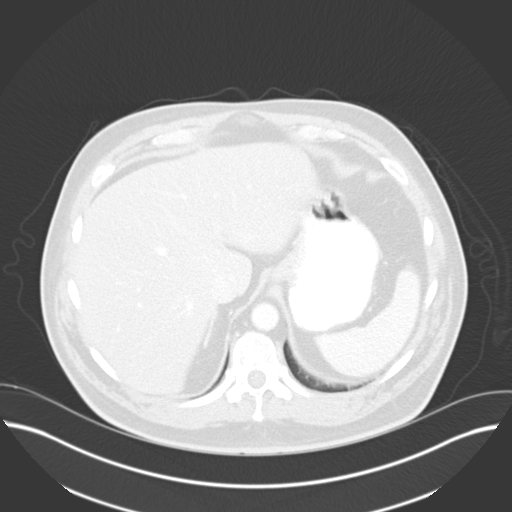
[im 87/98  lung]
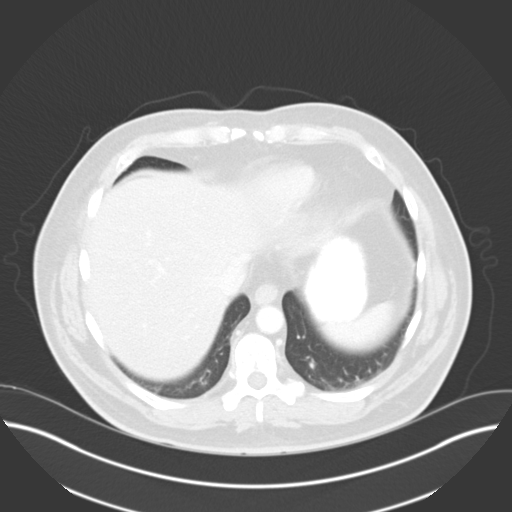
[im 92/98  soft-tissue]
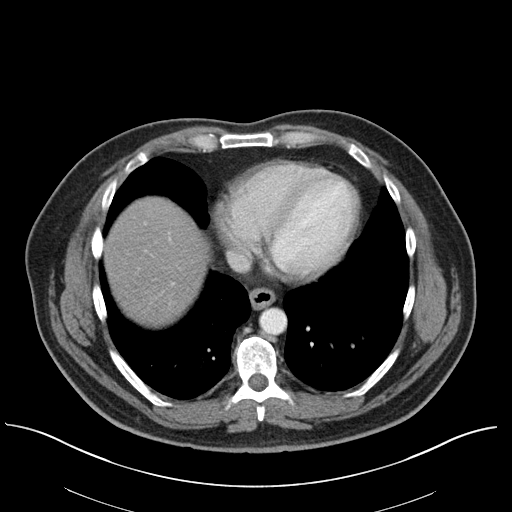
[im 92/98  lung]
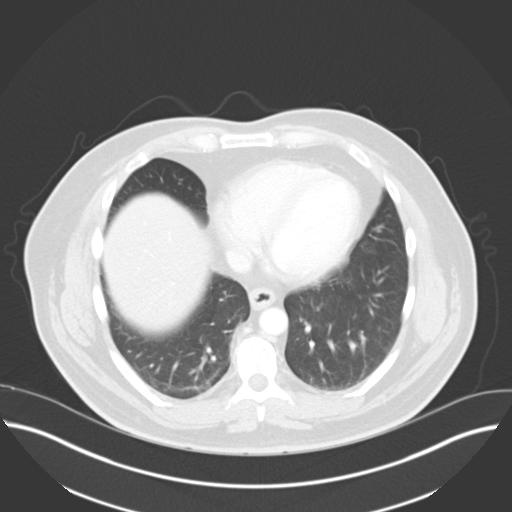

[13 of 32 positions shown; findings below may reference images not displayed]

FINDINGS: Minimal bibasilar atelectasis is noted.

The liver and spleen are unremarkable in appearance.  The
gallbladder is within normal limits.  The pancreas and adrenal
glands are unremarkable.

There is severe chronic left-sided hydronephrosis, reflecting a
very large staghorn calculus at the left renal pelvis, extending
into the renal calyces.  Hydronephrosis is most severe at the upper
pole of the left kidney, where several additional large calyceal
stones are seen.  The degree of hydronephrosis has mildly worsened
from [E7], though the stones are relatively unchanged in
appearance.

On this contrast-enhanced study, note is made of mild wall
enhancement at the left renal pelvis, raising question for mild
underlying infection.  There is no evidence of xanthogranulomatous
pyelonephritis.

Vague tiny foci of increased attenuation are noted along the course
of the right ureter; this is of uncertain significance, but could
reflect small right ureteral stones.  There is mild right-sided
hydronephrosis, possibly reflecting partial distal obstruction, as
the right ureter is only mildly distended.  Perinephric stranding
is noted bilaterally.

No free fluid is identified.  The small bowel is unremarkable in
appearance.  The stomach is within normal limits.  No acute
vascular abnormalities are seen.  Mild scattered calcification is
noted along the abdominal aorta and its branches.

The appendix is not definitely seen; there is no evidence for
appendicitis.  The colon is grossly unremarkable in appearance.

The bladder is decompressed and not well assessed.  A urachal
remnant is incidentally seen.  The prostate remains normal in size.
No inguinal lymphadenopathy is seen.

No acute osseous abnormalities are identified.
IMPRESSION: 1.  Severe chronic left-sided hydronephrosis noted, reflecting a
very large staghorn calculus at the left renal pelvis, extending
into the renal calyces.  Hydronephrosis is most severe at the upper
pole of the left kidney, where there are several additional large
calyceal stones.  The degree of hydronephrosis has mildly worsened
from [E7], though the stones are grossly unchanged.

Note made of mild wall enhancement at the left renal pelvis,
raising question for mild underlying infection.  No evidence of
xanthogranulomatous pyelonephritis at this time.

2.  Vague tiny foci of increased attenuation along the course of
the right ureter; these are of uncertain significance, but could
reflect small right ureteral stones.  Mild right-sided
hydronephrosis noted, possibly reflecting partial distal
obstruction.

3.  Mild scattered calcification along the abdominal aorta and its
branches.

## 2013-03-03 MED ORDER — POLYETHYLENE GLYCOL 3350 17 G PO PACK: 17.0000 g | PACK | Freq: Two times a day (BID) | ORAL | Status: DC

## 2013-03-03 MED ORDER — FENTANYL CITRATE 0.05 MG/ML IJ SOLN
50.0000 ug | Freq: Once | INTRAMUSCULAR | Status: AC
  Administered 2013-03-03: 50 ug via INTRAVENOUS
  Filled 2013-03-03: qty 2

## 2013-03-03 MED ORDER — ONDANSETRON 8 MG PO TBDP: 8.0000 mg | ORAL_TABLET | Freq: Three times a day (TID) | ORAL | Status: DC | PRN

## 2013-03-03 MED ORDER — HYDROMORPHONE HCL PF 1 MG/ML IJ SOLN
1.0000 mg | Freq: Once | INTRAMUSCULAR | Status: AC
  Administered 2013-03-03: 1 mg via INTRAVENOUS
  Filled 2013-03-03: qty 1

## 2013-03-03 MED ORDER — OXYCODONE-ACETAMINOPHEN 5-325 MG PO TABS: 2.0000 | ORAL_TABLET | ORAL | Status: DC | PRN

## 2013-03-03 MED ORDER — DOCUSATE SODIUM 250 MG PO CAPS: 250.0000 mg | ORAL_CAPSULE | Freq: Every day | ORAL | Status: DC

## 2013-03-03 MED ORDER — IOHEXOL 300 MG/ML  SOLN
50.0000 mL | Freq: Once | INTRAMUSCULAR | Status: AC | PRN
  Administered 2013-03-03: 50 mL via ORAL

## 2013-03-03 MED ORDER — ONDANSETRON HCL 4 MG/2ML IJ SOLN
4.0000 mg | Freq: Once | INTRAMUSCULAR | Status: AC
  Administered 2013-03-03: 4 mg via INTRAVENOUS
  Filled 2013-03-03: qty 2

## 2013-03-03 MED ORDER — IOHEXOL 300 MG/ML  SOLN
100.0000 mL | Freq: Once | INTRAMUSCULAR | Status: AC | PRN
  Administered 2013-03-03: 100 mL via INTRAVENOUS

## 2013-03-03 NOTE — ED Notes (Signed)
Pt c/o L flank pain, extensive hx of kidney stones. Pt reports hematuria x 2 days. Pt c/o vomiting x 2 tonight.

## 2013-03-03 NOTE — ED Provider Notes (Signed)
History     CSN: [id number]  Arrival date & time 03/03/13  4782   First MD Initiated Contact with Patient 03/03/13 [phone]      Chief Complaint  Patient presents with  . Flank Pain    (Consider location/radiation/quality/duration/timing/severity/associated sxs/prior treatment) HPI 57 yo male presents to the ER with complaint of right sided abdominal pain, nausea, vomiting, and constipation.  Last BM yesterday.  Pt reports 2-3 days of hematuria.  Pt reports h/o kidney stones, usually left sided.  None in several years.  No fevers.  Pain is different from his kidney stones as it is more in the front of his abdomen and not in his flank with radiation into his groin.  He reports he has had his appendix out.  No prior colonoscopies or h/o diverticulitis.  No fevers.    Past Medical History  Diagnosis Date  . Kidney stone   . Diabetes mellitus     Past Surgical History  Procedure Laterality Date  . Lithotripsy    . Uretral stent    . No past surgeries      History reviewed. No pertinent family history.  History  Substance Use Topics  . Smoking status: Never Smoker   . Smokeless tobacco: Not on file  . Alcohol Use: 1.2 oz/week    2 Cans of beer per week     Comment: occasional      Review of Systems  All other systems reviewed and are negative.    Allergies  Review of patient's allergies indicates no known allergies.  Home Medications   Current Outpatient Rx  Name  Route  Sig  Dispense  Refill  . aspirin EC 81 MG tablet   Oral   Take 162 mg by mouth every morning.         Marland Kitchen atorvastatin (LIPITOR) 10 MG tablet   Oral   Take 10 mg by mouth every evening.         . insulin glargine (LANTUS) 100 UNIT/ML injection   Subcutaneous   Inject 20 Units into the skin at bedtime.         Marland Kitchen lisinopril (PRINIVIL,ZESTRIL) 20 MG tablet   Oral   Take 20 mg by mouth every morning.         . metFORMIN (GLUCOPHAGE) 1000 MG tablet   Oral   Take 1,000 mg by mouth 2  (two) times daily with a meal.         . Multiple Vitamins-Minerals (MULTIVITAMINS THER. W/MINERALS) TABS   Oral   Take 1 tablet by mouth every morning.          . Pioglitazone HCl (ACTOS PO)   Oral   Take 1 tablet by mouth every evening.           BP 140/76  Pulse 89  Temp(Src) 98.7 F (37.1 C) (Oral)  Resp 20  SpO2 96%  Physical Exam  Constitutional: He is oriented to person, place, and time. He appears well-developed and well-nourished.  HENT:  Head: Normocephalic and atraumatic.  Nose: Nose normal.  Mouth/Throat: Oropharynx is clear and moist.  Eyes: Conjunctivae and EOM are normal. Pupils are equal, round, and reactive to light.  Neck: Normal range of motion. Neck supple. No JVD present. No tracheal deviation present. No thyromegaly present.  Cardiovascular: Normal rate, regular rhythm, normal heart sounds and intact distal pulses.  Exam reveals no gallop and no friction rub.   No murmur heard. Pulmonary/Chest: Effort normal and breath sounds normal.  No stridor. No respiratory distress. He has no wheezes. He has no rales. He exhibits no tenderness.  Abdominal: Soft. Bowel sounds are normal. He exhibits no distension and no mass. There is tenderness (tenderness to palpation along right abdomen, without rebound or guarding.  No referred pain.  Suprapubic and left abdomen benign.  No CVA tenderness). There is no rebound and no guarding.  Musculoskeletal: Normal range of motion. He exhibits no edema and no tenderness.  Lymphadenopathy:    He has no cervical adenopathy.  Neurological: He is alert and oriented to person, place, and time. He exhibits normal muscle tone. Coordination normal.  Skin: Skin is warm and dry. No rash noted. No erythema. No pallor.  Psychiatric: He has a normal mood and affect. His behavior is normal. Judgment and thought content normal.    ED Course  Procedures (including critical care time)  Labs Reviewed  CBC WITH DIFFERENTIAL - Abnormal;  Notable for the following:    WBC 11.8 (*)    HCT 38.4 (*)    Eosinophils Relative 6 (*)    All other components within normal limits  COMPREHENSIVE METABOLIC PANEL - Abnormal; Notable for the following:    Glucose, Bld 163 (*)    Total Bilirubin 0.2 (*)    All other components within normal limits  URINALYSIS, ROUTINE W REFLEX MICROSCOPIC - Abnormal; Notable for the following:    APPearance CLOUDY (*)    Hgb urine dipstick LARGE (*)    Protein, ur 30 (*)    Leukocytes, UA SMALL (*)    All other components within normal limits  URINE MICROSCOPIC-ADD ON - Abnormal; Notable for the following:    Bacteria, UA FEW (*)    All other components within normal limits  URINE CULTURE   Ct Abdomen Pelvis W Contrast  03/03/2013   *RADIOLOGY REPORT*  Clinical Data: Left flank pain, nausea, vomiting and hematuria. History of renal stones.  Mild leukocytosis.  CT ABDOMEN AND PELVIS WITH CONTRAST  Technique:  Multidetector CT imaging of the abdomen and pelvis was performed following the standard protocol during bolus administration of intravenous contrast.  Contrast: 100 mL of Omnipaque 300 IV contrast  Comparison: CT of the abdomen and pelvis performed 01/10/2011  Findings: Minimal bibasilar atelectasis is noted.  The liver and spleen are unremarkable in appearance.  The gallbladder is within normal limits.  The pancreas and adrenal glands are unremarkable.  There is severe chronic left-sided hydronephrosis, reflecting a very large staghorn calculus at the left renal pelvis, extending into the renal calyces.  Hydronephrosis is most severe at the upper pole of the left kidney, where several additional large calyceal stones are seen.  The degree of hydronephrosis has mildly worsened from 2012, though the stones are relatively unchanged in appearance.  On this contrast-enhanced study, note is made of mild wall enhancement at the left renal pelvis, raising question for mild underlying infection.  There is no  evidence of xanthogranulomatous pyelonephritis.  Vague tiny foci of increased attenuation are noted along the course of the right ureter; this is of uncertain significance, but could reflect small right ureteral stones.  There is mild right-sided hydronephrosis, possibly reflecting partial distal obstruction, as the right ureter is only mildly distended.  Perinephric stranding is noted bilaterally.  No free fluid is identified.  The small bowel is unremarkable in appearance.  The stomach is within normal limits.  No acute vascular abnormalities are seen.  Mild scattered calcification is noted along the abdominal aorta and its  branches.  The appendix is not definitely seen; there is no evidence for appendicitis.  The colon is grossly unremarkable in appearance.  The bladder is decompressed and not well assessed.  A urachal remnant is incidentally seen.  The prostate remains normal in size. No inguinal lymphadenopathy is seen.  No acute osseous abnormalities are identified.  IMPRESSION:  1.  Severe chronic left-sided hydronephrosis noted, reflecting a very large staghorn calculus at the left renal pelvis, extending into the renal calyces.  Hydronephrosis is most severe at the upper pole of the left kidney, where there are several additional large calyceal stones.  The degree of hydronephrosis has mildly worsened from 2012, though the stones are grossly unchanged.  Note made of mild wall enhancement at the left renal pelvis, raising question for mild underlying infection.  No evidence of xanthogranulomatous pyelonephritis at this time.  2.  Vague tiny foci of increased attenuation along the course of the right ureter; these are of uncertain significance, but could reflect small right ureteral stones.  Mild right-sided hydronephrosis noted, possibly reflecting partial distal obstruction.  3.  Mild scattered calcification along the abdominal aorta and its branches.   Original Report Authenticated By: Tonia Ghent, M.D.      1. Staghorn calculus   2. Hydronephrosis, bilateral   3. Hematuria   4. Right lateral abdominal pain   5. Constipation       MDM  57 year old male with history of kidney stones, who presents with atypical pain for his normal.  Kidney stones.  Right-sided abdominal pain.  Of concern for possible other etiologies of his pain such as diverticulitis.  He does report that he has had his appendix out already.  Will check CT scan with contrast, as well as baseline labs.  Will provide pain and nausea medicine.        Olivia Mackie, MD 03/03/13 [phone]

## 2013-03-07 LAB — URINE CULTURE

## 2013-12-03 ENCOUNTER — Encounter: Payer: Self-pay | Admitting: Gastroenterology

## 2014-01-22 ENCOUNTER — Encounter: Payer: BC Managed Care – PPO | Admitting: Gastroenterology

## 2014-02-01 ENCOUNTER — Observation Stay (HOSPITAL_COMMUNITY)
Admission: EM | Admit: 2014-02-01 | Discharge: 2014-02-01 | Disposition: A | Discharge status: Home / Self Care | Payer: BC Managed Care – PPO | Attending: Urology | Admitting: Urology

## 2014-02-01 ENCOUNTER — Encounter (HOSPITAL_COMMUNITY): Admission: EM | Disposition: A | Discharge status: Home / Self Care | Payer: Self-pay | Source: Home / Self Care

## 2014-02-01 ENCOUNTER — Emergency Department (HOSPITAL_COMMUNITY): Payer: BC Managed Care – PPO

## 2014-02-01 ENCOUNTER — Encounter (HOSPITAL_COMMUNITY): Payer: BC Managed Care – PPO | Admitting: Anesthesiology

## 2014-02-01 ENCOUNTER — Encounter (HOSPITAL_COMMUNITY): Payer: Self-pay | Admitting: Emergency Medicine

## 2014-02-01 ENCOUNTER — Observation Stay (HOSPITAL_COMMUNITY): Payer: BC Managed Care – PPO | Admitting: Anesthesiology

## 2014-02-01 DIAGNOSIS — N1330 Unspecified hydronephrosis: Secondary | ICD-10-CM | POA: Insufficient documentation

## 2014-02-01 DIAGNOSIS — R109 Unspecified abdominal pain: Secondary | ICD-10-CM | POA: Insufficient documentation

## 2014-02-01 DIAGNOSIS — Z7982 Long term (current) use of aspirin: Secondary | ICD-10-CM | POA: Insufficient documentation

## 2014-02-01 DIAGNOSIS — Z794 Long term (current) use of insulin: Secondary | ICD-10-CM | POA: Insufficient documentation

## 2014-02-01 DIAGNOSIS — N201 Calculus of ureter: Principal | ICD-10-CM | POA: Insufficient documentation

## 2014-02-01 DIAGNOSIS — R319 Hematuria, unspecified: Secondary | ICD-10-CM | POA: Insufficient documentation

## 2014-02-01 DIAGNOSIS — N200 Calculus of kidney: Secondary | ICD-10-CM | POA: Diagnosis present

## 2014-02-01 DIAGNOSIS — Z79899 Other long term (current) drug therapy: Secondary | ICD-10-CM | POA: Insufficient documentation

## 2014-02-01 DIAGNOSIS — E119 Type 2 diabetes mellitus without complications: Secondary | ICD-10-CM | POA: Insufficient documentation

## 2014-02-01 HISTORY — PX: CYSTOSCOPY WITH RETROGRADE PYELOGRAM, URETEROSCOPY AND STENT PLACEMENT: SHX5789

## 2014-02-01 LAB — COMPREHENSIVE METABOLIC PANEL
ALBUMIN: 4 g/dL (ref 3.5–5.2)
ALK PHOS: 66 U/L (ref 39–117)
ALT: 25 U/L (ref 0–53)
AST: 20 U/L (ref 0–37)
BILIRUBIN TOTAL: 0.4 mg/dL (ref 0.3–1.2)
BUN: 20 mg/dL (ref 6–23)
CHLORIDE: 103 meq/L (ref 96–112)
CO2: 26 mEq/L (ref 19–32)
Calcium: 9.4 mg/dL (ref 8.4–10.5)
Creatinine, Ser: 1.07 mg/dL (ref 0.50–1.35)
GFR calc Af Amer: 87 mL/min — ABNORMAL LOW (ref >90)
GFR calc non Af Amer: 75 mL/min — ABNORMAL LOW (ref >90)
Glucose, Bld: 145 mg/dL — ABNORMAL HIGH (ref 70–99)
Potassium: 3.9 mEq/L (ref 3.7–5.3)
Sodium: 143 mEq/L (ref 137–147)
Total Protein: 7.4 g/dL (ref 6.0–8.3)

## 2014-02-01 LAB — URINE MICROSCOPIC-ADD ON

## 2014-02-01 LAB — URINALYSIS, ROUTINE W REFLEX MICROSCOPIC
BILIRUBIN URINE: NEGATIVE
GLUCOSE, UA: NEGATIVE mg/dL
Ketones, ur: NEGATIVE mg/dL
Nitrite: NEGATIVE
PROTEIN: 30 mg/dL — AB
Specific Gravity, Urine: 1.008 (ref 1.005–1.030)
Urobilinogen, UA: 0.2 mg/dL (ref 0.0–1.0)
pH: 5 (ref 5.0–8.0)

## 2014-02-01 LAB — CBC
HCT: 39.5 % (ref 39.0–52.0)
Hemoglobin: 13.2 g/dL (ref 13.0–17.0)
MCH: 30.6 pg (ref 26.0–34.0)
MCHC: 33.4 g/dL (ref 30.0–36.0)
MCV: 91.6 fL (ref 78.0–100.0)
PLATELETS: 296 10*3/uL (ref 150–400)
RBC: 4.31 MIL/uL (ref 4.22–5.81)
RDW: 13.3 % (ref 11.5–15.5)
WBC: 11.7 10*3/uL — AB (ref 4.0–10.5)

## 2014-02-01 LAB — GLUCOSE, CAPILLARY: GLUCOSE-CAPILLARY: 112 mg/dL — AB (ref 70–99)

## 2014-02-01 LAB — CBG MONITORING, ED: Glucose-Capillary: 119 mg/dL — ABNORMAL HIGH (ref 70–99)

## 2014-02-01 LAB — SURGICAL PCR SCREEN
MRSA, PCR: NEGATIVE
Staphylococcus aureus: POSITIVE — AB

## 2014-02-01 IMAGING — CT CT ABD-PELV W/O CM
1 series · 15 of 27 positions shown, 19 images · non-contrast
Comparison: [DATE]

CLINICAL DATA: Flank pain

EXAM:
CT ABDOMEN AND PELVIS WITHOUT CONTRAST
TECHNIQUE: Multidetector CT imaging of the abdomen and pelvis was performed
following the standard protocol without IV contrast.

[Series 4: lung · axial · 0.81mm/px · z∈[-176,-56]mm · 15 of 27 slices shown, 19 images]
[im 2/27  soft-tissue]
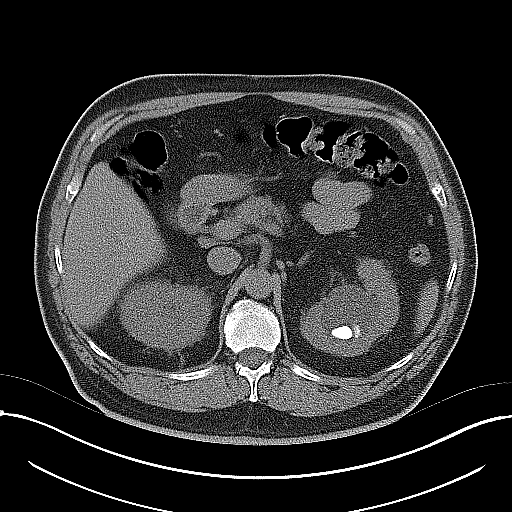
[im 2/27  bone]
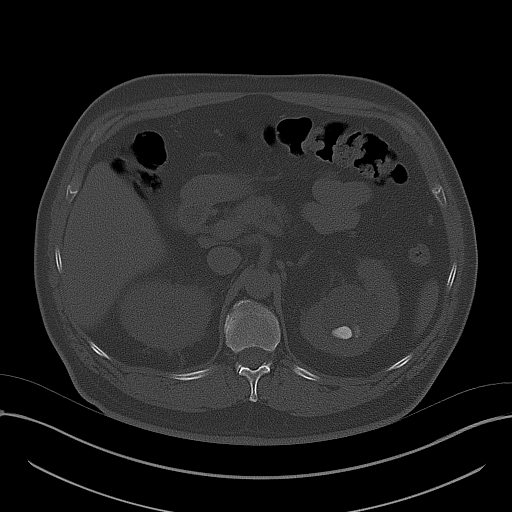
[im 4/27  soft-tissue]
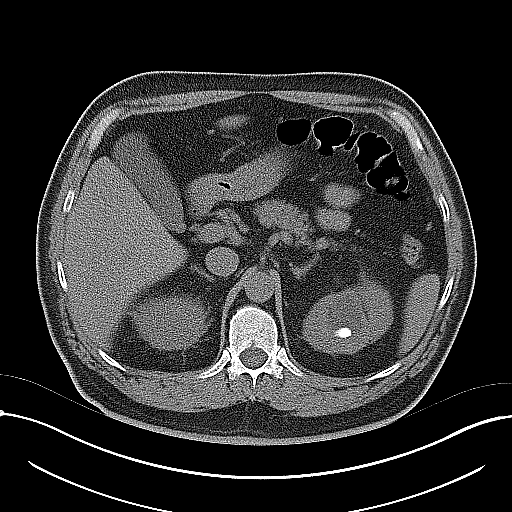
[im 6/27  soft-tissue]
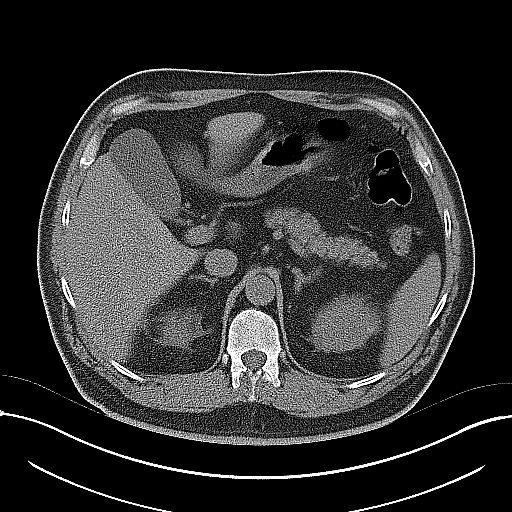
[im 8/27  soft-tissue]
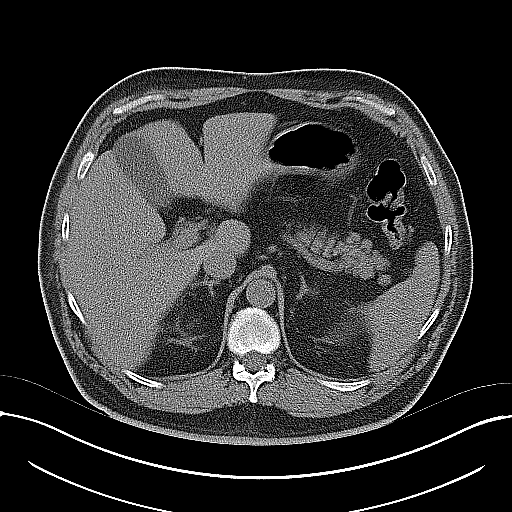
[im 10/27  soft-tissue]
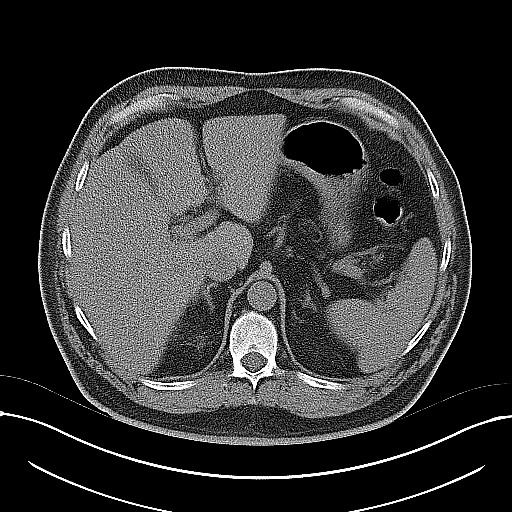
[im 12/27  soft-tissue]
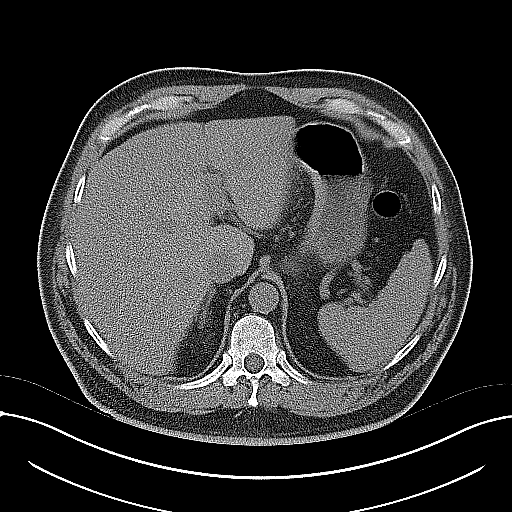
[im 14/27  soft-tissue]
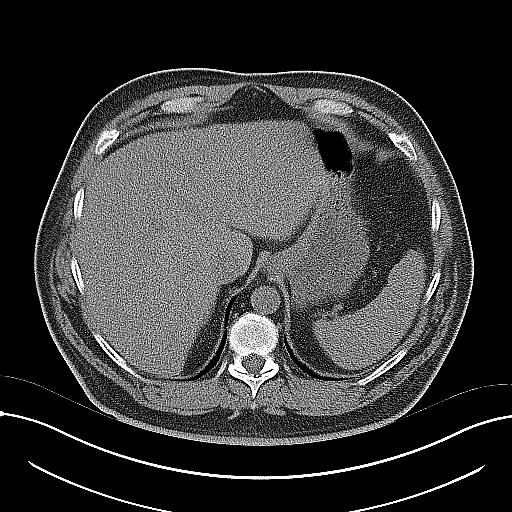
[im 16/27  soft-tissue]
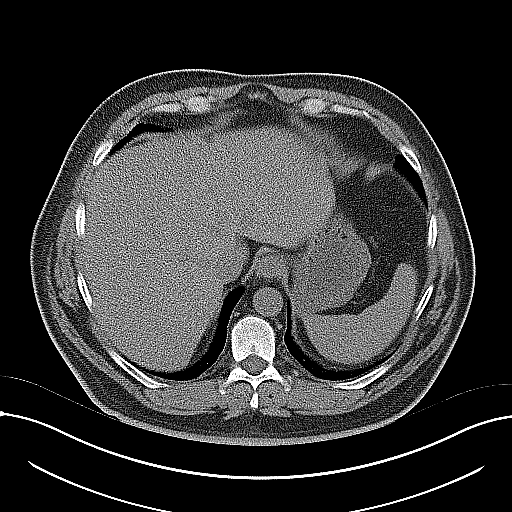
[im 18/27  soft-tissue]
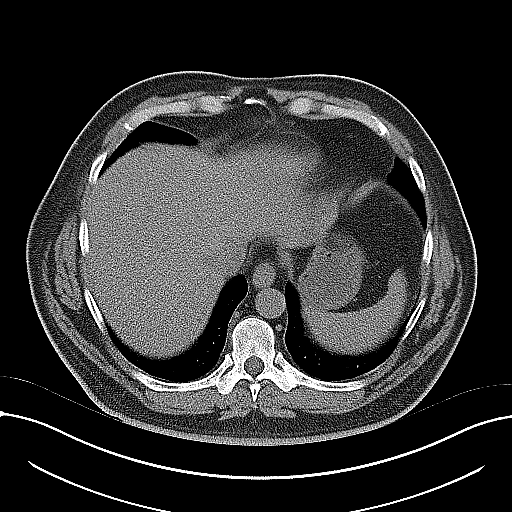
[im 18/27  bone]
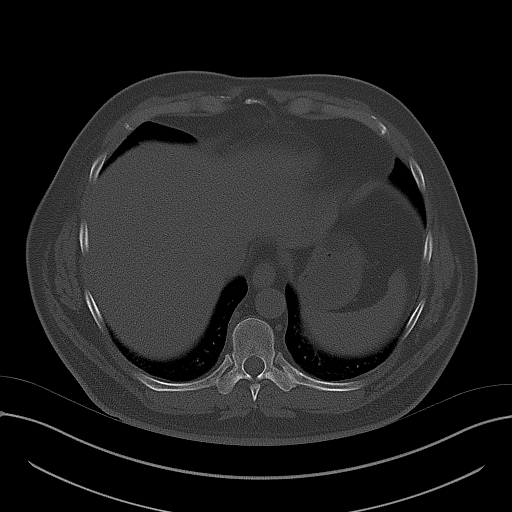
[im 20/27  soft-tissue]
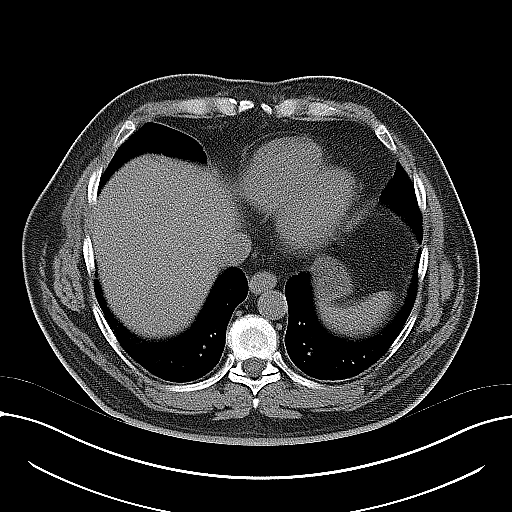
[im 22/27  soft-tissue]
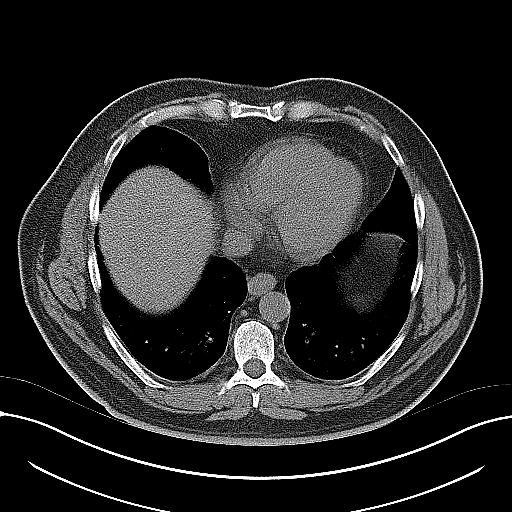
[im 23/27  lung]
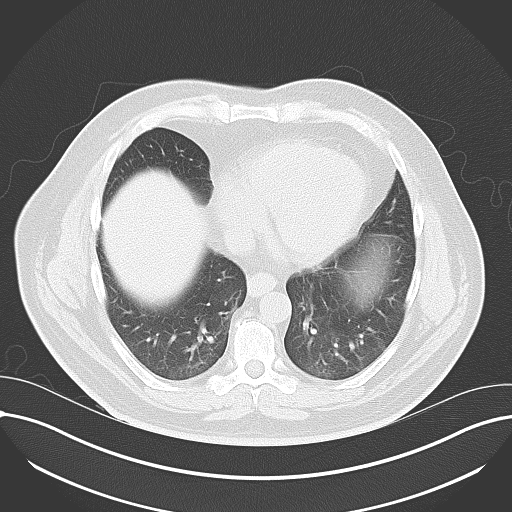
[im 24/27  soft-tissue]
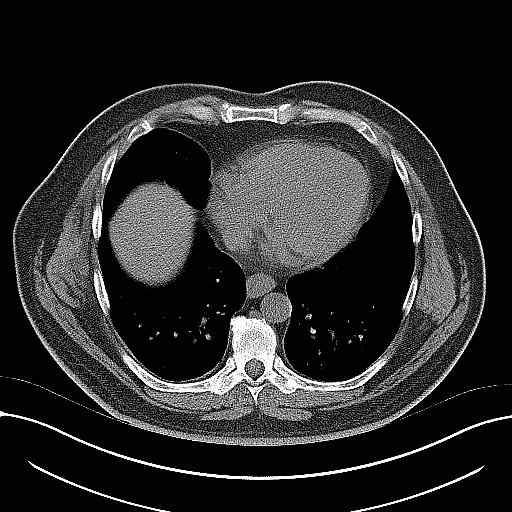
[im 24/27  lung]
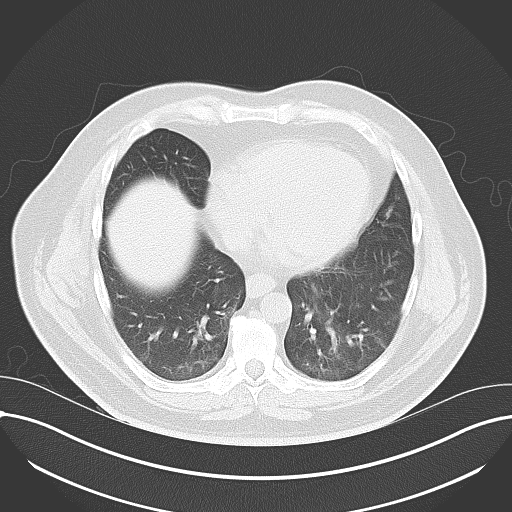
[im 25/27  lung]
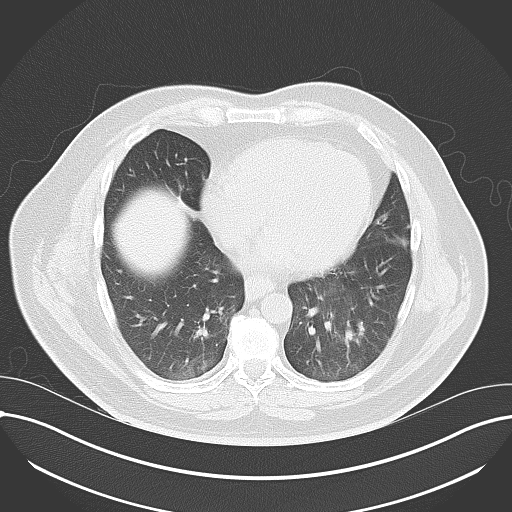
[im 26/27  soft-tissue]
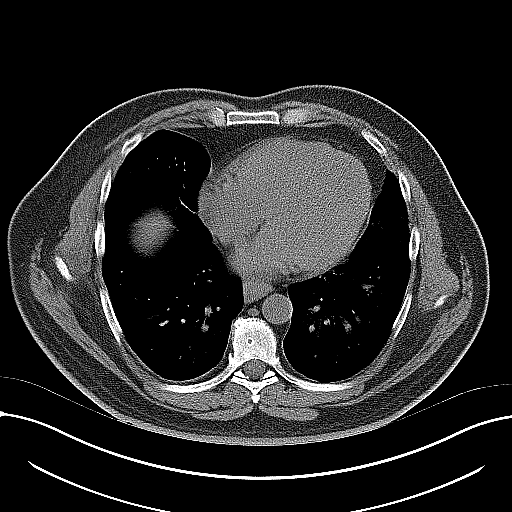
[im 26/27  lung]
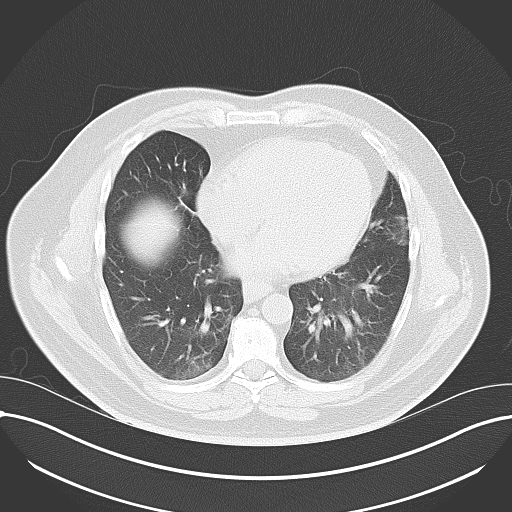

[15 of 27 positions shown; findings below may reference images not displayed]

FINDINGS: BODY WALL: Unremarkable.

LOWER CHEST: Unremarkable.

ABDOMEN/PELVIS:

Liver: No focal abnormality.

Biliary: No evidence of biliary obstruction or stone.

Pancreas: Unremarkable.

Spleen: Unremarkable.

Adrenals: Unremarkable.

Kidneys and ureters: Moderate right hydroureteronephrosis secondary
to a 7 mm stone at the right ureteral vesicular junction. There are
punctate stones in the upper pole right kidney. There is a branching
calculus within the left kidney, with separate discrete stones in
the dilated upper pole collecting system measuring up to 16 mm.
There is chronic hydronephrosis on the left, with cortical thinning.
No left ureteral calculus present.

Bladder: Unremarkable.

Reproductive: Unremarkable.

Bowel: No obstruction. Appendectomy.

Retroperitoneum: No mass or adenopathy.

Peritoneum: No free fluid or gas.

Vascular: No acute abnormality.

OSSEOUS: No acute abnormalities.
IMPRESSION: 1. Bilateral hydronephrosis, chronic on the left and new on the
right.
2. Right hydronephrosis is secondary to a 7 mm stone at the
ureterovesicular junction.
3. Left hydronephrosis is secondary to a long-standing staghorn
calculus.

## 2014-02-01 SURGERY — CYSTOURETEROSCOPY, WITH RETROGRADE PYELOGRAM AND STENT INSERTION
Anesthesia: General | Site: Ureter | Laterality: Bilateral

## 2014-02-01 MED ORDER — LIDOCAINE HCL (CARDIAC) 20 MG/ML IV SOLN
INTRAVENOUS | Status: DC | PRN
  Administered 2014-02-01: 30 mg via INTRAVENOUS

## 2014-02-01 MED ORDER — ONDANSETRON HCL 4 MG/2ML IJ SOLN
4.0000 mg | Freq: Once | INTRAMUSCULAR | Status: AC
Start: 2014-02-01 — End: 2014-02-01
  Administered 2014-02-01: 4 mg via INTRAVENOUS
  Filled 2014-02-01: qty 2

## 2014-02-01 MED ORDER — PHENYLEPHRINE HCL 10 MG/ML IJ SOLN
INTRAMUSCULAR | Status: DC | PRN
  Administered 2014-02-01 (×2): 40 ug via INTRAVENOUS

## 2014-02-01 MED ORDER — SODIUM CHLORIDE 0.9 % IR SOLN
Status: DC | PRN
  Administered 2014-02-01: 1000 mL via INTRAVESICAL
  Administered 2014-02-01: 3000 mL via INTRAVESICAL

## 2014-02-01 MED ORDER — OXYBUTYNIN CHLORIDE 5 MG PO TABS
5.0000 mg | ORAL_TABLET | Freq: Three times a day (TID) | ORAL | Status: DC | PRN
Start: 2014-02-01 — End: 2021-02-11

## 2014-02-01 MED ORDER — FENTANYL CITRATE 0.05 MG/ML IJ SOLN
50.0000 ug | Freq: Once | INTRAMUSCULAR | Status: DC
  Filled 2014-02-01: qty 2

## 2014-02-01 MED ORDER — DEXTROSE 5 % IV SOLN
440.0000 mg | INTRAVENOUS | Status: AC
Start: 2014-02-01 — End: 2014-02-01
  Administered 2014-02-01: 442.5 mg via INTRAVENOUS
  Filled 2014-02-01: qty 11

## 2014-02-01 MED ORDER — PROPOFOL 10 MG/ML IV BOLUS
INTRAVENOUS | Status: DC | PRN
  Administered 2014-02-01: 175 mg via INTRAVENOUS

## 2014-02-01 MED ORDER — SODIUM CHLORIDE 0.9 % IV SOLN
INTRAVENOUS | Status: DC
  Administered 2014-02-01 (×2): via INTRAVENOUS

## 2014-02-01 MED ORDER — LACTATED RINGERS IV SOLN
INTRAVENOUS | Status: DC | PRN
  Administered 2014-02-01: 10:00:00 via INTRAVENOUS

## 2014-02-01 MED ORDER — SUCCINYLCHOLINE CHLORIDE 20 MG/ML IJ SOLN
INTRAMUSCULAR | Status: DC | PRN
  Administered 2014-02-01: 130 mg via INTRAVENOUS

## 2014-02-01 MED ORDER — ONDANSETRON HCL 4 MG/2ML IJ SOLN
INTRAMUSCULAR | Status: DC | PRN
  Administered 2014-02-01: 2 mg via INTRAVENOUS

## 2014-02-01 MED ORDER — 0.9 % SODIUM CHLORIDE (POUR BTL) OPTIME
TOPICAL | Status: DC | PRN
  Administered 2014-02-01: 1000 mL

## 2014-02-01 MED ORDER — DOCUSATE SODIUM 100 MG PO CAPS
100.0000 mg | ORAL_CAPSULE | Freq: Two times a day (BID) | ORAL | Status: DC
  Administered 2014-02-01: 100 mg via ORAL
  Filled 2014-02-01 (×2): qty 1

## 2014-02-01 MED ORDER — DEXTROSE 5 % IV SOLN
1.0000 g | Freq: Once | INTRAVENOUS | Status: AC
  Administered 2014-02-01: 1 g via INTRAVENOUS

## 2014-02-01 MED ORDER — FENTANYL CITRATE 0.05 MG/ML IJ SOLN
25.0000 ug | INTRAMUSCULAR | Status: DC | PRN
Start: 2014-02-01 — End: 2014-02-01

## 2014-02-01 MED ORDER — PROMETHAZINE HCL 25 MG/ML IJ SOLN: 6.2500 mg | INTRAMUSCULAR | Status: DC | PRN

## 2014-02-01 MED ORDER — HYDROMORPHONE HCL PF 1 MG/ML IJ SOLN: 0.5000 mg | INTRAMUSCULAR | Status: DC | PRN

## 2014-02-01 MED ORDER — HYDROMORPHONE HCL PF 1 MG/ML IJ SOLN
1.0000 mg | Freq: Once | INTRAMUSCULAR | Status: AC
  Administered 2014-02-01: 1 mg via INTRAVENOUS
  Filled 2014-02-01: qty 1

## 2014-02-01 MED ORDER — MIDAZOLAM HCL 5 MG/5ML IJ SOLN
INTRAMUSCULAR | Status: DC | PRN
  Administered 2014-02-01: 1 mg via INTRAVENOUS

## 2014-02-01 MED ORDER — KETAMINE HCL 10 MG/ML IJ SOLN
INTRAMUSCULAR | Status: DC | PRN
  Administered 2014-02-01: 15 mg via INTRAVENOUS
  Administered 2014-02-01: 10 mg via INTRAVENOUS

## 2014-02-01 MED ORDER — SENNOSIDES-DOCUSATE SODIUM 8.6-50 MG PO TABS: 1.0000 | ORAL_TABLET | Freq: Two times a day (BID) | ORAL | Status: DC

## 2014-02-01 MED ORDER — EPHEDRINE SULFATE 50 MG/ML IJ SOLN
INTRAMUSCULAR | Status: DC | PRN
  Administered 2014-02-01 (×3): 5 mg via INTRAVENOUS
  Administered 2014-02-01: 10 mg via INTRAVENOUS
  Administered 2014-02-01: 5 mg via INTRAVENOUS

## 2014-02-01 MED ORDER — OXYCODONE-ACETAMINOPHEN 5-325 MG PO TABS
1.0000 | ORAL_TABLET | ORAL | Status: DC | PRN
  Administered 2014-02-01: 2 via ORAL
  Filled 2014-02-01: qty 2

## 2014-02-01 MED ORDER — INSULIN ASPART 100 UNIT/ML ~~LOC~~ SOLN: 4.0000 [IU] | Freq: Three times a day (TID) | SUBCUTANEOUS | Status: DC

## 2014-02-01 MED ORDER — OXYCODONE-ACETAMINOPHEN 5-325 MG PO TABS: 1.0000 | ORAL_TABLET | Freq: Four times a day (QID) | ORAL | Status: DC | PRN

## 2014-02-01 MED ORDER — KETOROLAC TROMETHAMINE 30 MG/ML IJ SOLN
30.0000 mg | Freq: Once | INTRAMUSCULAR | Status: AC
  Administered 2014-02-01: 30 mg via INTRAVENOUS
  Filled 2014-02-01: qty 1

## 2014-02-01 MED ORDER — FENTANYL CITRATE 0.05 MG/ML IJ SOLN
INTRAMUSCULAR | Status: DC | PRN
  Administered 2014-02-01 (×2): 50 ug via INTRAVENOUS
  Administered 2014-02-01: 100 ug via INTRAVENOUS

## 2014-02-01 MED ORDER — IOHEXOL 300 MG/ML  SOLN
INTRAMUSCULAR | Status: DC | PRN
  Administered 2014-02-01: 18 mL via URETHRAL

## 2014-02-01 MED ORDER — FENTANYL CITRATE 0.05 MG/ML IJ SOLN
50.0000 ug | Freq: Once | INTRAMUSCULAR | Status: AC
  Administered 2014-02-01: 50 ug via INTRAVENOUS

## 2014-02-01 MED ORDER — SENNA 8.6 MG PO TABS
1.0000 | ORAL_TABLET | Freq: Two times a day (BID) | ORAL | Status: DC
  Administered 2014-02-01: 8.6 mg via ORAL
  Filled 2014-02-01: qty 1

## 2014-02-01 MED ORDER — INSULIN ASPART 100 UNIT/ML ~~LOC~~ SOLN: 0.0000 [IU] | Freq: Three times a day (TID) | SUBCUTANEOUS | Status: DC

## 2014-02-01 SURGICAL SUPPLY — 28 items
BAG URINE DRAINAGE (UROLOGICAL SUPPLIES) ×3 IMPLANT
BASKET LASER NITINOL 1.9FR (BASKET) ×2 IMPLANT
BASKET STNLS GEMINI 4WIRE 3FR (BASKET) IMPLANT
BASKET ZERO TIP NITINOL 2.4FR (BASKET) IMPLANT
BSKT STON RTRVL 120 1.9FR (BASKET) ×1
BSKT STON RTRVL GEM 120X11 3FR (BASKET)
BSKT STON RTRVL ZERO TP 2.4FR (BASKET)
CATH INTERMIT  6FR 70CM (CATHETERS) ×5 IMPLANT
CLOTH BEACON ORANGE TIMEOUT ST (SAFETY) ×3 IMPLANT
DRAPE CAMERA CLOSED 9X96 (DRAPES) ×3 IMPLANT
ELECT REM PT RETURN 9FT ADLT (ELECTROSURGICAL)
ELECTRODE REM PT RTRN 9FT ADLT (ELECTROSURGICAL) IMPLANT
FIBER LASER FLEXIVA 200 (UROLOGICAL SUPPLIES) IMPLANT
FIBER LASER FLEXIVA 365 (UROLOGICAL SUPPLIES) IMPLANT
GLOVE BIOGEL M STRL SZ7.5 (GLOVE) ×3 IMPLANT
GOWN STRL REUS W/TWL LRG LVL3 (GOWN DISPOSABLE) ×3 IMPLANT
GUIDEWIRE ANG ZIPWIRE 038X150 (WIRE) ×3 IMPLANT
GUIDEWIRE STR DUAL SENSOR (WIRE) ×3 IMPLANT
IV NS IRRIG 3000ML ARTHROMATIC (IV SOLUTION) ×3 IMPLANT
NDL SAFETY ECLIPSE 18X1.5 (NEEDLE) IMPLANT
NEEDLE HYPO 18GX1.5 SHARP (NEEDLE) ×3
PACK CYSTO (CUSTOM PROCEDURE TRAY) ×3 IMPLANT
PAD TELFA 2X3 NADH STRL (GAUZE/BANDAGES/DRESSINGS) ×2 IMPLANT
SHEATH ACCESS URETERAL 38CM (SHEATH) ×2 IMPLANT
STENT POLARIS 5FRX24 (STENTS) ×4 IMPLANT
SYRINGE 10CC LL (SYRINGE) ×2 IMPLANT
SYRINGE IRR TOOMEY STRL 70CC (SYRINGE) IMPLANT
TUBE FEEDING 8FR 16IN STR KANG (MISCELLANEOUS) ×3 IMPLANT

## 2014-02-01 NOTE — H&P (Signed)
[name] is an 58 y.o. male.    Chief Complaint: Bilateral Nephrolithiasis with Hydronephrosis and Hematuria  HPI:   1 - Lt > Rt Large Volume Nephrolithiasis - Long h/o stone disease s/p Left SWL x2, multiple stons passed with medical therapy presents to [facility] ER with Rt lfnak pain, hematuria, and CT evidence of large left staghorn stone with hydro and Rt distal 36mm ureteral stone with hydro. Cr <1.2. NO fevers. NO sig leukocytosis.   PMH sig for appy, IDDM2. No CV disease. Pt's PCP is Dr. Harl Favor in [place]. Pt had 1/2 cup water about 6:15 AM.  Past Medical History  Diagnosis Date  . Kidney stone   . Diabetes mellitus     Past Surgical History  Procedure Laterality Date  . Lithotripsy    . Uretral stent    . No past surgeries      History reviewed. No pertinent family history. Social History:  reports that he has never smoked. He does not have any smokeless tobacco history on file. He reports that he drinks about 1.2 ounces of alcohol per week. He reports that he does not use illicit drugs.  Allergies: No Known Allergies   (Not in a hospital admission)  Results for orders placed during the hospital encounter of 02/01/14 (from the past 48 hour(s))  URINALYSIS, ROUTINE W REFLEX MICROSCOPIC     Status: Abnormal   Collection Time    02/01/14  3:13 AM      Result Value Ref Range   Color, Urine YELLOW  YELLOW   APPearance CLOUDY (*) CLEAR   Specific Gravity, Urine 1.008  1.005 - 1.030   pH 5.0  5.0 - 8.0   Glucose, UA NEGATIVE  NEGATIVE mg/dL   Hgb urine dipstick LARGE (*) NEGATIVE   Bilirubin Urine NEGATIVE  NEGATIVE   Ketones, ur NEGATIVE  NEGATIVE mg/dL   Protein, ur 30 (*) NEGATIVE mg/dL   Urobilinogen, UA 0.2  0.0 - 1.0 mg/dL   Nitrite NEGATIVE  NEGATIVE   Leukocytes, UA LARGE (*) NEGATIVE  URINE MICROSCOPIC-ADD ON     Status: Abnormal   Collection Time    02/01/14  3:13 AM      Result Value Ref Range   Squamous Epithelial / LPF RARE  RARE   WBC, UA  TOO NUMEROUS TO COUNT  <3 WBC/hpf   RBC / HPF 7-10  <3 RBC/hpf   Bacteria, UA FEW (*) RARE   Casts GRANULAR CAST (*) NEGATIVE   Crystals CA OXALATE CRYSTALS (*) NEGATIVE  CBC     Status: Abnormal   Collection Time    02/01/14  3:14 AM      Result Value Ref Range   WBC 11.7 (*) 4.0 - 10.5 K/uL   RBC 4.31  4.22 - 5.81 MIL/uL   Hemoglobin 13.2  13.0 - 17.0 g/dL   HCT 39.5  39.0 - 52.0 %   MCV 91.6  78.0 - 100.0 fL   MCH 30.6  26.0 - 34.0 pg   MCHC 33.4  30.0 - 36.0 g/dL   RDW 13.3  11.5 - 15.5 %   Platelets 296  150 - 400 K/uL  COMPREHENSIVE METABOLIC PANEL     Status: Abnormal   Collection Time    02/01/14  3:14 AM      Result Value Ref Range   Sodium 143  137 - 147 mEq/L   Potassium 3.9  3.7 - 5.3 mEq/L   Chloride 103  96 - 112  mEq/L   CO2 26  19 - 32 mEq/L   Glucose, Bld 145 (*) 70 - 99 mg/dL   BUN 20  6 - 23 mg/dL   Creatinine, Ser 1.07  0.50 - 1.35 mg/dL   Calcium 9.4  8.4 - 10.5 mg/dL   Total Protein 7.4  6.0 - 8.3 g/dL   Albumin 4.0  3.5 - 5.2 g/dL   AST 20  0 - 37 U/L   ALT 25  0 - 53 U/L   Alkaline Phosphatase 66  39 - 117 U/L   Total Bilirubin 0.4  0.3 - 1.2 mg/dL   GFR calc non Af Amer 75 (*) >90 mL/min   GFR calc Af Amer 87 (*) >90 mL/min   Comment: (NOTE)     The eGFR has been calculated using the CKD EPI equation.     This calculation has not been validated in all clinical situations.     eGFR's persistently <90 mL/min signify possible Chronic Kidney     Disease.   Ct Abdomen Pelvis Wo Contrast  02/01/2014   CLINICAL DATA:  Flank pain  EXAM: CT ABDOMEN AND PELVIS WITHOUT CONTRAST  TECHNIQUE: Multidetector CT imaging of the abdomen and pelvis was performed following the standard protocol without IV contrast.  COMPARISON:  03/03/2013  FINDINGS: BODY WALL: Unremarkable.  LOWER CHEST: Unremarkable.  ABDOMEN/PELVIS:  Liver: No focal abnormality.  Biliary: No evidence of biliary obstruction or stone.  Pancreas: Unremarkable.  Spleen: Unremarkable.  Adrenals:  Unremarkable.  Kidneys and ureters: Moderate right hydroureteronephrosis secondary to a 7 mm stone at the right ureteral vesicular junction. There are punctate stones in the upper pole right kidney. There is a branching calculus within the left kidney, with separate discrete stones in the dilated upper pole collecting system measuring up to 16 mm. There is chronic hydronephrosis on the left, with cortical thinning. No left ureteral calculus present.  Bladder: Unremarkable.  Reproductive: Unremarkable.  Bowel: No obstruction. Appendectomy.  Retroperitoneum: No mass or adenopathy.  Peritoneum: No free fluid or gas.  Vascular: No acute abnormality.  OSSEOUS: No acute abnormalities.  IMPRESSION: 1. Bilateral hydronephrosis, chronic on the left and new on the right. 2. Right hydronephrosis is secondary to a 7 mm stone at the ureterovesicular junction. 3. Left hydronephrosis is secondary to a long-standing staghorn calculus.   Electronically Signed   By: Jorje Guild M.D.   On: 02/01/2014 06:08    Review of Systems  Constitutional: Negative.  Negative for fever and chills.  HENT: Negative.   Eyes: Negative.   Respiratory: Negative.   Cardiovascular: Negative.   Gastrointestinal: Positive for nausea. Negative for vomiting.  Genitourinary: Positive for hematuria and flank pain.  Musculoskeletal: Negative.   Skin: Negative.   Neurological: Negative.   Endo/Heme/Allergies: Negative.   Psychiatric/Behavioral: Negative.     Blood pressure 136/76, pulse 84, temperature 97.9 F (36.6 C), temperature source Oral, resp. rate 16, SpO2 95.00%. Physical Exam  Constitutional: He is oriented to person, place, and time. He appears well-developed and well-nourished.  HENT:  Head: Normocephalic and atraumatic.  Eyes: EOM are normal. Pupils are equal, round, and reactive to light.  Neck: Normal range of motion. Neck supple.  Cardiovascular: Normal rate and regular rhythm.   Respiratory: Effort normal.  GI:  Soft. Bowel sounds are normal.  Genitourinary: Penis normal.  Mild Rt CVAT  Musculoskeletal: Normal range of motion.  Neurological: He is alert and oriented to person, place, and time.  Skin: Skin is warm and dry.  Psychiatric: He has a normal mood and affect. His behavior is normal. Judgment and thought content normal.     Assessment/Plan  1 - Lt > Rt Large Volume Nephrolithiasis - Significant acute and chronic problem. Acute issue is bilateral obstruction, needs renal deocmpression today. Favor Rt ureteroscoic stone manipulation and bilaterl JJ stents with plan to address left staghorn in staged setting in elective fashion. He voiced understanding.  Risks for procedure today including bleeding, infection, damage to kidney / ureter / bladder, non-cure, need for staged surgery and rare risks such as DVT, PE, MI, CVA, Mortaility discussed.   Admit now and plan for OR today.  Alexis Frock 02/01/2014, 6:40 AM

## 2014-02-01 NOTE — Transfer of Care (Signed)
Immediate Anesthesia Transfer of Care Note  Patient: [name]  Procedure(s) Performed: Procedure(s): CYSTOSCOPY WITH BILATERAL RETROGRADE CYSTOSCOPY, BILATERAL RETROGRADE, PYELOGRAM, RIGHT URETEROSCOPY, STONE BASKETRY,  AND BILATERAL STENT PLACEMENT (Bilateral)  Patient Location: PACU  Anesthesia Type:General  Level of Consciousness: awake, alert , oriented and patient cooperative  Airway & Oxygen Therapy: Patient Spontanous Breathing and Patient connected to face mask oxygen  Post-op Assessment: Report given to PACU RN and Post -op Vital signs reviewed and stable  Post vital signs: stable  Complications: No apparent anesthesia complications

## 2014-02-01 NOTE — ED Provider Notes (Signed)
CSN: [id number]     Arrival date & time 02/01/14  0228 History   First MD Initiated Contact with Patient 02/01/14 [phone]     Chief Complaint  Patient presents with  . Flank Pain     (Consider location/radiation/quality/duration/timing/severity/associated sxs/prior Treatment) HPI This is a 58 year old male with a long-standing history of nephrolithiasis and ureteral colic. He is here with about 3 days of gross hematuria and passage of 2 small stones. He has had some pain over the last 2-3 days but his pain became severe this morning. The pain is located in the right flank. It is characterized as like previous kidney stones. It has been associated with subjective fever but no chills. Has been associated with nausea and retching. He was given Zofran and fentanyl per protocol prior to my evaluation with minimal relief of his pain.  Past Medical History  Diagnosis Date  . Kidney stone   . Diabetes mellitus    Past Surgical History  Procedure Laterality Date  . Lithotripsy    . Uretral stent    . No past surgeries     History reviewed. No pertinent family history. History  Substance Use Topics  . Smoking status: Never Smoker   . Smokeless tobacco: Not on file  . Alcohol Use: 1.2 oz/week    2 Cans of beer per week     Comment: occasional    Review of Systems  All other systems reviewed and are negative.   Allergies  Review of patient's allergies indicates no known allergies.  Home Medications   Prior to Admission medications   Medication Sig Start Date End Date Taking? Authorizing Provider  amLODipine (NORVASC) 2.5 MG tablet Take 2.5 mg by mouth daily.   Yes Historical Provider, MD  aspirin EC 81 MG tablet Take 162 mg by mouth every morning.   Yes Historical Provider, MD  atorvastatin (LIPITOR) 10 MG tablet Take 10 mg by mouth every evening.   Yes Historical Provider, MD  gabapentin (NEURONTIN) 100 MG capsule Take 100 mg by mouth 3 (three) times daily.   Yes Historical Provider,  MD  insulin glargine (LANTUS) 100 UNIT/ML injection Inject 20 Units into the skin at bedtime.   Yes Historical Provider, MD  lisinopril (PRINIVIL,ZESTRIL) 40 MG tablet Take 40 mg by mouth daily.   Yes Historical Provider, MD  meloxicam (MOBIC) 15 MG tablet Take 15 mg by mouth daily.   Yes Historical Provider, MD  metFORMIN (GLUCOPHAGE) 1000 MG tablet Take 1,000 mg by mouth 2 (two) times daily with a meal.   Yes Historical Provider, MD  montelukast (SINGULAIR) 10 MG tablet Take 10 mg by mouth at bedtime.   Yes Historical Provider, MD  Multiple Vitamins-Minerals (MULTIVITAMINS THER. W/MINERALS) TABS Take 1 tablet by mouth every morning.    Yes Historical Provider, MD  Pioglitazone HCl (ACTOS PO) Take 15 mg by mouth every evening.    Yes Historical Provider, MD   BP 136/76  Pulse 84  Temp(Src) 97.9 F (36.6 C) (Oral)  Resp 16  SpO2 95%  Physical Exam General: Well-developed, well-nourished male in no acute distress; appearance consistent with age of record; appears uncomfortable HENT: normocephalic; atraumatic Eyes: pupils equal, round and reactive to light; extraocular muscles intact Neck: supple Heart: regular rate and rhythm Lungs: clear to auscultation bilaterally Abdomen: soft; nondistended; nontender; no masses or hepatosplenomegaly; bowel sounds present GU: right CVA tenderness Extremities: No deformity; full range of motion; pulses normal Neurologic: Awake, alert and oriented; motor function intact in all  extremities and symmetric; no facial droop Skin: Warm and dry Psychiatric: Anxious    ED Course  Procedures (including critical care time)   MDM   Nursing notes and vitals signs, including pulse oximetry, reviewed.  Summary of this visit's results, reviewed by myself:  Labs:  Results for orders placed during the hospital encounter of 02/01/14 (from the past 24 hour(s))  URINALYSIS, ROUTINE W REFLEX MICROSCOPIC     Status: Abnormal   Collection Time    02/01/14   3:13 AM      Result Value Ref Range   Color, Urine YELLOW  YELLOW   APPearance CLOUDY (*) CLEAR   Specific Gravity, Urine 1.008  1.005 - 1.030   pH 5.0  5.0 - 8.0   Glucose, UA NEGATIVE  NEGATIVE mg/dL   Hgb urine dipstick LARGE (*) NEGATIVE   Bilirubin Urine NEGATIVE  NEGATIVE   Ketones, ur NEGATIVE  NEGATIVE mg/dL   Protein, ur 30 (*) NEGATIVE mg/dL   Urobilinogen, UA 0.2  0.0 - 1.0 mg/dL   Nitrite NEGATIVE  NEGATIVE   Leukocytes, UA LARGE (*) NEGATIVE  URINE MICROSCOPIC-ADD ON     Status: Abnormal   Collection Time    02/01/14  3:13 AM      Result Value Ref Range   Squamous Epithelial / LPF RARE  RARE   WBC, UA TOO NUMEROUS TO COUNT  <3 WBC/hpf   RBC / HPF 7-10  <3 RBC/hpf   Bacteria, UA FEW (*) RARE   Casts GRANULAR CAST (*) NEGATIVE   Crystals CA OXALATE CRYSTALS (*) NEGATIVE  CBC     Status: Abnormal   Collection Time    02/01/14  3:14 AM      Result Value Ref Range   WBC 11.7 (*) 4.0 - 10.5 K/uL   RBC 4.31  4.22 - 5.81 MIL/uL   Hemoglobin 13.2  13.0 - 17.0 g/dL   HCT 39.5  39.0 - 52.0 %   MCV 91.6  78.0 - 100.0 fL   MCH 30.6  26.0 - 34.0 pg   MCHC 33.4  30.0 - 36.0 g/dL   RDW 13.3  11.5 - 15.5 %   Platelets 296  150 - 400 K/uL  COMPREHENSIVE METABOLIC PANEL     Status: Abnormal   Collection Time    02/01/14  3:14 AM      Result Value Ref Range   Sodium 143  137 - 147 mEq/L   Potassium 3.9  3.7 - 5.3 mEq/L   Chloride 103  96 - 112 mEq/L   CO2 26  19 - 32 mEq/L   Glucose, Bld 145 (*) 70 - 99 mg/dL   BUN 20  6 - 23 mg/dL   Creatinine, Ser 1.07  0.50 - 1.35 mg/dL   Calcium 9.4  8.4 - 10.5 mg/dL   Total Protein 7.4  6.0 - 8.3 g/dL   Albumin 4.0  3.5 - 5.2 g/dL   AST 20  0 - 37 U/L   ALT 25  0 - 53 U/L   Alkaline Phosphatase 66  39 - 117 U/L   Total Bilirubin 0.4  0.3 - 1.2 mg/dL   GFR calc non Af Amer 75 (*) >90 mL/min   GFR calc Af Amer 87 (*) >90 mL/min    Imaging Studies: Ct Abdomen Pelvis Wo Contrast  02/01/2014   CLINICAL DATA:  Flank pain  EXAM: CT  ABDOMEN AND PELVIS WITHOUT CONTRAST  TECHNIQUE: Multidetector CT imaging of the abdomen and pelvis  was performed following the standard protocol without IV contrast.  COMPARISON:  03/03/2013  FINDINGS: BODY WALL: Unremarkable.  LOWER CHEST: Unremarkable.  ABDOMEN/PELVIS:  Liver: No focal abnormality.  Biliary: No evidence of biliary obstruction or stone.  Pancreas: Unremarkable.  Spleen: Unremarkable.  Adrenals: Unremarkable.  Kidneys and ureters: Moderate right hydroureteronephrosis secondary to a 7 mm stone at the right ureteral vesicular junction. There are punctate stones in the upper pole right kidney. There is a branching calculus within the left kidney, with separate discrete stones in the dilated upper pole collecting system measuring up to 16 mm. There is chronic hydronephrosis on the left, with cortical thinning. No left ureteral calculus present.  Bladder: Unremarkable.  Reproductive: Unremarkable.  Bowel: No obstruction. Appendectomy.  Retroperitoneum: No mass or adenopathy.  Peritoneum: No free fluid or gas.  Vascular: No acute abnormality.  OSSEOUS: No acute abnormalities.  IMPRESSION: 1. Bilateral hydronephrosis, chronic on the left and new on the right. 2. Right hydronephrosis is secondary to a 7 mm stone at the ureterovesicular junction. 3. Left hydronephrosis is secondary to a long-standing staghorn calculus.   Electronically Signed   By: Jorje Guild M.D.   On: 02/01/2014 06:08   6:23 AM Dr. Tresa Moore to see patient in the ED. Patient given Rocephin 1 g for pyuria. Pain is relieved at this time.  Wynetta Fines, MD 02/01/14 [phone]

## 2014-02-01 NOTE — ED Notes (Signed)
MD at bedside. 

## 2014-02-01 NOTE — Anesthesia Postprocedure Evaluation (Signed)
  Anesthesia Post-op Note  Patient: [name]  Procedure(s) Performed: Procedure(s) (LRB): CYSTOSCOPY WITH BILATERAL RETROGRADE CYSTOSCOPY, BILATERAL RETROGRADE, PYELOGRAM, RIGHT URETEROSCOPY, STONE BASKETRY,  AND BILATERAL STENT PLACEMENT (Bilateral)  Patient Location: PACU  Anesthesia Type: General  Level of Consciousness: awake and alert   Airway and Oxygen Therapy: Patient Spontanous Breathing  Post-op Pain: mild  Post-op Assessment: Post-op Vital signs reviewed, Patient's Cardiovascular Status Stable, Respiratory Function Stable, Patent Airway and No signs of Nausea or vomiting  Last Vitals:  Filed Vitals:   02/01/14 1152  BP: 132/65  Pulse: 74  Temp: 36.4 C  Resp: 16    Post-op Vital Signs: stable   Complications: No apparent anesthesia complications

## 2014-02-01 NOTE — ED Notes (Signed)
Pt presents to the ED with a compliant of kidney stones.  Pt has a hx of same.  Pt states the severe pain began two hours ago.  Pt states pain located in the right back

## 2014-02-01 NOTE — Brief Op Note (Signed)
02/01/2014  11:00 AM  PATIENT:  [name]  58 y.o. male  PRE-OPERATIVE DIAGNOSIS:  bilateral ureteral stones  POST-OPERATIVE DIAGNOSIS:  bilateral ureteral stones  PROCEDURE:  Procedure(s): CYSTOSCOPY WITH BILATERAL RETROGRADE CYSTOSCOPY, BILATERAL RETROGRADE, PYELOGRAM, RIGHT URETEROSCOPY, STONE BASKETRY,  AND BILATERAL STENT PLACEMENT (Bilateral)  SURGEON:  Surgeon(s) and Role:    * Alexis Frock, MD - Primary  PHYSICIAN ASSISTANT:   ASSISTANTS: none   ANESTHESIA:   general  EBL:     BLOOD ADMINISTERED:none  DRAINS: none   LOCAL MEDICATIONS USED:  NONE  SPECIMEN:  Source of Specimen:  Small Rt Ureteral Stone Fragments for Discard  DISPOSITION OF SPECIMEN:  discard  COUNTS:  YES  TOURNIQUET:  * No tourniquets in log *  DICTATION: .Other Dictation: Dictation Number [id number]  PLAN OF CARE: Admit for overnight observation  PATIENT DISPOSITION:  PACU - hemodynamically stable.   Delay start of Pharmacological VTE agent (>24hrs) due to surgical blood loss or risk of bleeding: not applicable

## 2014-02-01 NOTE — Discharge Instructions (Signed)
1 - You may have urinary urgency (bladder spasms) and bloody urine on / off with stent in place. This is normal. ° °2 - Call MD or go to ER for fever >102, severe pain / nausea / vomiting not relieved by medications, or acute change in medical status ° °

## 2014-02-01 NOTE — Anesthesia Preprocedure Evaluation (Addendum)
Anesthesia Evaluation  Patient identified by MRN, date of birth, ID band Patient awake    Reviewed: Allergy & Precautions, H&P , NPO status , Patient's Chart, lab work & pertinent test results  Airway Mallampati: II TM Distance: >3 FB Neck ROM: Full    Dental no notable dental hx.    Pulmonary pneumonia -, resolved,  breath sounds clear to auscultation  Pulmonary exam normal       Cardiovascular Exercise Tolerance: Good Rhythm:Regular Rate:Normal     Neuro/Psych negative neurological ROS  negative psych ROS   GI/Hepatic negative GI ROS, Neg liver ROS,   Endo/Other  negative endocrine ROSdiabetes, Type 2, Oral Hypoglycemic Agents, Insulin Dependent  Renal/GU Renal disease  negative genitourinary   Musculoskeletal negative musculoskeletal ROS (+)   Abdominal   Peds negative pediatric ROS (+)  Hematology negative hematology ROS (+)   Anesthesia Other Findings   Reproductive/Obstetrics negative OB ROS                          Anesthesia Physical Anesthesia Plan  ASA: III  Anesthesia Plan: General   Post-op Pain Management:    Induction: Intravenous  Airway Management Planned: Oral ETT  Additional Equipment:   Intra-op Plan:   Post-operative Plan: Extubation in OR  Informed Consent: I have reviewed the patients History and Physical, chart, labs and discussed the procedure including the risks, benefits and alternatives for the proposed anesthesia with the patient or authorized representative who has indicated his/her understanding and acceptance.   Dental advisory given  Plan Discussed with: CRNA  Anesthesia Plan Comments:        Anesthesia Quick Evaluation

## 2014-02-02 LAB — URINE CULTURE
COLONY COUNT: NO GROWTH
Culture: NO GROWTH

## 2014-02-02 NOTE — Op Note (Signed)
NAMEELIJIO, STAPLES NO.:  [id number]  MEDICAL RECORD NO.:  [id number]  LOCATION:  4782                         FACILITY:  [facility]  PHYSICIAN:  Alexis Frock, MD     DATE OF BIRTH:  June 15, 1956  DATE OF PROCEDURE: 02/01/2014  DATE OF DISCHARGE:  02/01/2014                              OPERATIVE REPORT   DIAGNOSES:  Right ureteral stone, left staghorn kidney stone, right refractory flank pain.  PROCEDURE: 1. Cystoscopy with bilateral retrograde pyelogram interpretation. 2. Insertion of bilateral ureteral stents, 5 x 24, no tether. 3. Right ureteroscopy with basketing of stones.  FINDINGS: 1. Conglomerate of small stone fragments in the right ureter with     moderate hydroureteronephrosis. 2. Very large filling defect in the left kidney consistent with known     staghorn stone with focal upper pole hydronephrosis.  INDICATION:  [name] is a pleasant 58 year old gentleman with long history of recurrent nephrolithiasis more commonly on his left side than his right.  He presented to the [facility] Emergency Room this morning with refractory right flank pain.  A CT imaging was found to have a distal right ureteral stone with hydronephrosis, but it was a very large left staghorn calculus.  As he has felt that he had impending bilateral complete obstruction and with history of diabetes, so that urgent renal decompression was warranted.  As he has very large bilateral stones, I felt that right ureteroscopic stone manipulation was also warranted to clear the right side in preparation for staged procedures on the left. He had no fever or infectious parameters.  PROCEDURE IN DETAIL:  The patient being [name] was verified. Procedure being cysto bilateral retrograde pyelogram, insertion of bilateral stents right ureteroscopy with stone manipulation was confirmed.  Procedure was carried out.  Time-out was performed. Intravenous antibiotics were administered.   General LMA anesthesia was introduced.  The patient was placed into a low lithotomy position and sterile field was created by prepping and draping the patient's penis, perineum, proximal thighs using iodine x3.  Next, cystourethroscopy was performed using a 22-French rigid cystoscope with a 30-degree offset lens.  Inspection of anterior and posterior urethra unremarkable. Inspection of urinary bladder revealed no diverticula, calcifications, papular lesions.  The right ureteral orifice was gently cannulated with a 6-French end-hole catheter and right retrograde pyelogram obtained.  Right retrograde pyelogram demonstrates single right ureter, single system right kidney.  There were questionable multiple filling defects in the distal ureter consistent with known stone.  There was moderate hydroureteronephrosis above this.  A 0.038 Glidewire was advanced at the level of the upper pole and set aside as a safety wire.  An 8-French feeding tube was placed in urinary bladder for pressure release.  Next, semi-rigid ureteroscopy was performed of the distal right ureter alongside a separate Sensor working wire.  There were no calcifications seen in the distal ureter.  This likely reflected displacement of the smaller fragments superiorly.  As such the semi-rigid ureteroscope was exchanged for a 38 cm 12/14 ureteral access sheath over the Sensor working wire at the level of proximal ureter and not beyond where we had the visualized with direct vision.  Next,  flexible digital ureteroscopy was performed.  The proximal right ureter and systematic inspection of the right kidney using 8-French digital ureteroscope.  As expected, there were multiple small stone fragments that I felt likely represented a conglomerate stone in the distal ureter.  These were then sequentially grasped with escape basket and brought out their entirety set aside for discard.  There was somewhat small and it was felt that the  patient would likely better specimens from his contralateral side at upcoming surgery.  Repeat systematic inspection of the entire right kidney revealed complete resolution of all stone fragments larger than 1/3rd mm.  There was no evidence of renal perforation.  There was excellent hemostasis.  The access sheath was removed under continuous ureteroscopic vision.  No mucosal abnormalities or calcifications were seen.  Finally a new 5 x 24 Polaris-type stent was placed over the remaining safety wire using cystoscopic and fluoroscopic guidance.  Good proximal and distal deployment were noted.  Attention was then raised to the left side.  The left ureteral orifice was cannulated with a 6-French end-hole catheter and left retrograde pyelogram was obtained.  Left retrograde pyelogram demonstrated showed single left ureter with single system left kidney.  There was a large hydronephrosis without ureteral nephrosis and the future filling defects consistent with known massive staghorn stone.  A 0.038 Glidewire was advanced at the level of the upper pole over which a new 5 x 24 Polaris-type stent was placed. Good proximal and distal curl were noted.  The proximal end did not grow completely as likely there was minimal room in the collecting system. However, it was hooked and appeared to be appropriate position to eliminate any high-grade obstruction at this location.  Bladder was emptied per cystoscope.  Procedure was then terminated.  The patient tolerated the procedure well.  There were no immediate periprocedural complications.  The patient was taken to the postanesthesia care in stable condition.          ______________________________ Alexis Frock, MD     TM/MEDQ  D:  02/01/2014  T:  02/02/2014  Job:  [id number]

## 2014-02-03 ENCOUNTER — Other Ambulatory Visit: Payer: Self-pay | Admitting: Urology

## 2014-02-03 ENCOUNTER — Encounter (HOSPITAL_COMMUNITY): Payer: Self-pay | Admitting: Urology

## 2014-03-05 ENCOUNTER — Encounter (HOSPITAL_COMMUNITY): Payer: Self-pay | Admitting: Pharmacy Technician

## 2014-03-11 ENCOUNTER — Other Ambulatory Visit (HOSPITAL_COMMUNITY): Payer: Self-pay | Admitting: Urology

## 2014-03-11 NOTE — Patient Instructions (Signed)
Your procedure is scheduled on:  03/17/14  MONDAY  Report to [facility] at    0930   AM.   Call this number if you have problems the morning of surgery: [phone]     TAKE ONE HALF INSULIN DOSAGE Sunday NIGHT-  HAVE SNACK BEFORE BEDTIME OR MIDNIGHT   Do not eat food  Or drink :After Midnight. Sunday NIGHT   Take these medicines the morning of surgery with A SIP OF WATER: AMLODIPINE, GABAPENTIN DO NOT TAKE ANY INSULIN OR DIABETES MEDICATION Monday MORNING  .  Contacts, dentures or partial plates, or metal hairpins  can not be worn to surgery. Your family will be responsible for glasses, dentures, hearing aides while you are in surgery  Leave suitcase in the car. After surgery it may be brought to your room.  For patients admitted to the hospital, checkout time is 11:00 AM day of  discharge.         [facility] IS NOT RESPONSIBLE FOR ANY VALUABLES                                                                  [facility] - Preparing for Surgery Before surgery, you can play an important role.  Because skin is not sterile, your skin needs to be as free of germs as possible.  You can reduce the number of germs on your skin by washing with CHG (chlorahexidine gluconate) soap before surgery.  CHG is an antiseptic cleaner which kills germs and bonds with the skin to continue killing germs even after washing. Please DO NOT use if you have an allergy to CHG or antibacterial soaps.  If your skin becomes reddened/irritated stop using the CHG and inform your nurse when you arrive at Short Stay. Do not shave (including legs and underarms) for at least 48 hours prior to the first CHG shower.  You may shave your face/neck. Please follow these instructions carefully:  1.  Shower with CHG Soap the night before surgery and the  morning of Surgery.  2.  If you choose to wash your hair, wash your hair first as usual with your   normal  shampoo.  3.  After you shampoo, rinse your hair and body thoroughly to remove the  shampoo.                           4.  Use CHG as you would any other liquid soap.  You can apply chg directly  to the skin and wash                       Gently with a scrungie or clean washcloth.  5.  Apply the CHG Soap to your body ONLY FROM THE NECK DOWN.   Do not use on face/ open                           Wound or open sores. Avoid contact with eyes, ears mouth and genitals (private parts).  Wash face,  Genitals (private parts) with your normal soap.             6.  Wash thoroughly, paying special attention to the area where your surgery  will be performed.  7.  Thoroughly rinse your body with warm water from the neck down.  8.  DO NOT shower/wash with your normal soap after using and rinsing off  the CHG Soap.                9.  Pat yourself dry with a clean towel.            10.  Wear clean pajamas.            11.  Place clean sheets on your bed the night of your first shower and do not  sleep with pets. Day of Surgery : Do not apply any lotions/deodorants the morning of surgery.  Please wear clean clothes to the hospital/surgery center.  FAILURE TO FOLLOW THESE INSTRUCTIONS MAY RESULT IN THE CANCELLATION OF YOUR SURGERY PATIENT SIGNATURE_________________________________  NURSE SIGNATURE__________________________________  ________________________________________________________________________

## 2014-03-11 NOTE — Progress Notes (Signed)
CT abdomen with mention of lung bases 5/15 EPIC

## 2014-03-12 ENCOUNTER — Encounter (HOSPITAL_COMMUNITY)
Admission: RE | Admit: 2014-03-12 | Discharge: 2014-03-12 | Disposition: A | Discharge status: Home / Self Care | Payer: BC Managed Care – PPO | Source: Ambulatory Visit | Attending: Urology | Admitting: Urology

## 2014-03-12 ENCOUNTER — Encounter (HOSPITAL_COMMUNITY): Payer: Self-pay

## 2014-03-12 DIAGNOSIS — Z01810 Encounter for preprocedural cardiovascular examination: Secondary | ICD-10-CM | POA: Insufficient documentation

## 2014-03-12 DIAGNOSIS — Z01812 Encounter for preprocedural laboratory examination: Secondary | ICD-10-CM | POA: Insufficient documentation

## 2014-03-12 DIAGNOSIS — Z01818 Encounter for other preprocedural examination: Secondary | ICD-10-CM | POA: Insufficient documentation

## 2014-03-12 HISTORY — DX: Polyneuropathy, unspecified: G62.9

## 2014-03-12 HISTORY — DX: Unspecified osteoarthritis, unspecified site: M19.90

## 2014-03-12 HISTORY — DX: Essential (primary) hypertension: I10

## 2014-03-12 LAB — CBC
HEMATOCRIT: 38.1 % — AB (ref 39.0–52.0)
HEMOGLOBIN: 12.9 g/dL — AB (ref 13.0–17.0)
MCH: 31.5 pg (ref 26.0–34.0)
MCHC: 33.9 g/dL (ref 30.0–36.0)
MCV: 92.9 fL (ref 78.0–100.0)
Platelets: 279 10*3/uL (ref 150–400)
RBC: 4.1 MIL/uL — AB (ref 4.22–5.81)
RDW: 13.1 % (ref 11.5–15.5)
WBC: 12.1 10*3/uL — AB (ref 4.0–10.5)

## 2014-03-12 LAB — BASIC METABOLIC PANEL
BUN: 38 mg/dL — ABNORMAL HIGH (ref 6–23)
CHLORIDE: 104 meq/L (ref 96–112)
CO2: 25 mEq/L (ref 19–32)
Calcium: 10.1 mg/dL (ref 8.4–10.5)
Creatinine, Ser: 1.1 mg/dL (ref 0.50–1.35)
GFR calc non Af Amer: 73 mL/min — ABNORMAL LOW (ref >90)
GFR, EST AFRICAN AMERICAN: 84 mL/min — AB (ref >90)
Glucose, Bld: 110 mg/dL — ABNORMAL HIGH (ref 70–99)
POTASSIUM: 5.6 meq/L — AB (ref 3.7–5.3)
Sodium: 142 mEq/L (ref 137–147)

## 2014-03-12 NOTE — Progress Notes (Signed)
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  1225pm-  Faxed cbc, bmet to dr Tresa Moore via epic

## 2014-03-16 MED ORDER — GENTAMICIN SULFATE 40 MG/ML IJ SOLN
440.0000 mg | INTRAVENOUS | Status: AC
  Administered 2014-03-17: 440 mg via INTRAVENOUS
  Filled 2014-03-16: qty 11

## 2014-03-17 ENCOUNTER — Encounter (HOSPITAL_COMMUNITY): Payer: Self-pay | Admitting: *Deleted

## 2014-03-17 ENCOUNTER — Inpatient Hospital Stay (HOSPITAL_COMMUNITY): Payer: BC Managed Care – PPO | Admitting: Certified Registered Nurse Anesthetist

## 2014-03-17 ENCOUNTER — Inpatient Hospital Stay (HOSPITAL_COMMUNITY): Payer: BC Managed Care – PPO

## 2014-03-17 ENCOUNTER — Inpatient Hospital Stay (HOSPITAL_COMMUNITY)
Admission: RE | Admit: 2014-03-17 | Discharge: 2014-03-21 | DRG: 660 | Disposition: A | Discharge status: Home / Self Care | Payer: BC Managed Care – PPO | Source: Ambulatory Visit | Attending: Urology | Admitting: Urology

## 2014-03-17 ENCOUNTER — Encounter (HOSPITAL_COMMUNITY)
Admission: RE | Disposition: A | Discharge status: Home / Self Care | Payer: Self-pay | Source: Ambulatory Visit | Attending: Urology

## 2014-03-17 ENCOUNTER — Encounter (HOSPITAL_COMMUNITY): Payer: BC Managed Care – PPO | Admitting: Certified Registered Nurse Anesthetist

## 2014-03-17 DIAGNOSIS — Z794 Long term (current) use of insulin: Secondary | ICD-10-CM

## 2014-03-17 DIAGNOSIS — Z79899 Other long term (current) drug therapy: Secondary | ICD-10-CM

## 2014-03-17 DIAGNOSIS — Z7982 Long term (current) use of aspirin: Secondary | ICD-10-CM

## 2014-03-17 DIAGNOSIS — N200 Calculus of kidney: Principal | ICD-10-CM | POA: Diagnosis present

## 2014-03-17 DIAGNOSIS — Z87442 Personal history of urinary calculi: Secondary | ICD-10-CM

## 2014-03-17 DIAGNOSIS — E119 Type 2 diabetes mellitus without complications: Secondary | ICD-10-CM | POA: Diagnosis present

## 2014-03-17 DIAGNOSIS — N179 Acute kidney failure, unspecified: Secondary | ICD-10-CM | POA: Diagnosis not present

## 2014-03-17 DIAGNOSIS — I10 Essential (primary) hypertension: Secondary | ICD-10-CM | POA: Diagnosis present

## 2014-03-17 HISTORY — PX: CYSTOSCOPY W/ URETERAL STENT PLACEMENT: SHX1429

## 2014-03-17 HISTORY — PX: NEPHROLITHOTOMY: SHX5134

## 2014-03-17 LAB — GLUCOSE, CAPILLARY
GLUCOSE-CAPILLARY: 113 mg/dL — AB (ref 70–99)
Glucose-Capillary: 119 mg/dL — ABNORMAL HIGH (ref 70–99)
Glucose-Capillary: 129 mg/dL — ABNORMAL HIGH (ref 70–99)

## 2014-03-17 LAB — POCT I-STAT 4, (NA,K, GLUC, HGB,HCT)
Glucose, Bld: 115 mg/dL — ABNORMAL HIGH (ref 70–99)
HCT: 35 % — ABNORMAL LOW (ref 39.0–52.0)
HEMOGLOBIN: 11.9 g/dL — AB (ref 13.0–17.0)
Potassium: 4.5 mEq/L (ref 3.7–5.3)
Sodium: 141 mEq/L (ref 137–147)

## 2014-03-17 LAB — HEMOGLOBIN AND HEMATOCRIT, BLOOD
HCT: 31.5 % — ABNORMAL LOW (ref 39.0–52.0)
Hemoglobin: 10.7 g/dL — ABNORMAL LOW (ref 13.0–17.0)

## 2014-03-17 IMAGING — RF DG ABDOMEN 1V
1 series · 7 of 7 positions shown · non-contrast
Comparison: Three intraoperative C-arm views performed earlier in
the day [DATE]. [DATE] CT.

CLINICAL DATA: Left kidney stone removal.

EXAM:
ABDOMEN - 1 VIEW
Fluoroscopic time: 20 min.

[Series 1: run · 7 of 7 slices shown]
[im 1/7]
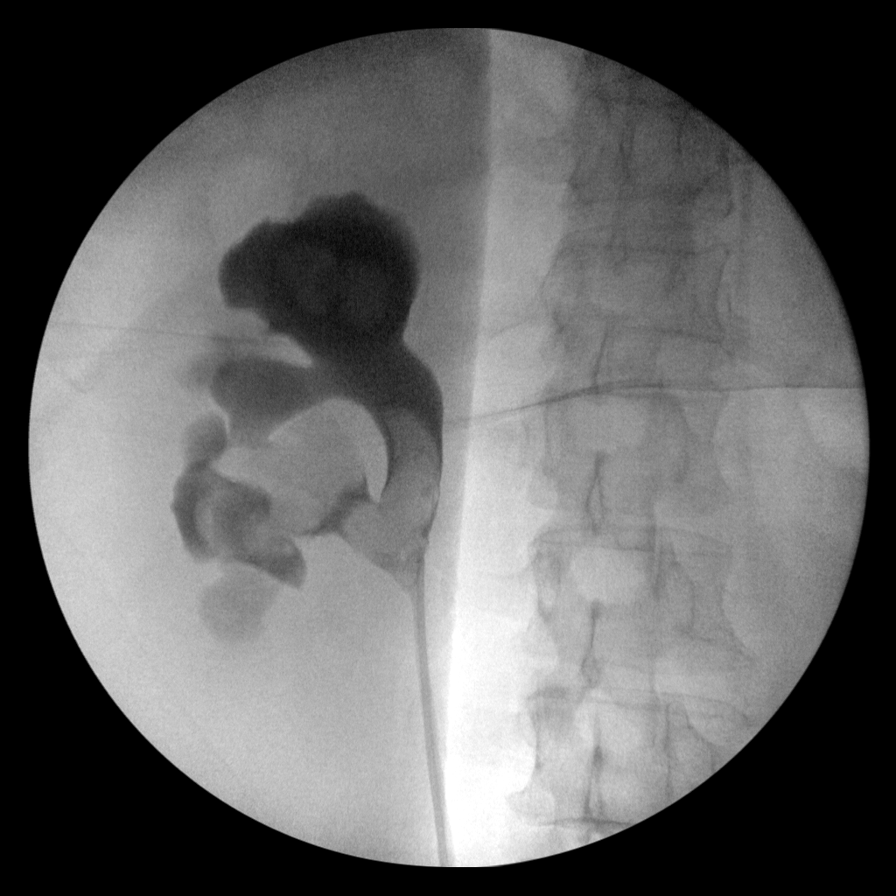
[im 2/7]
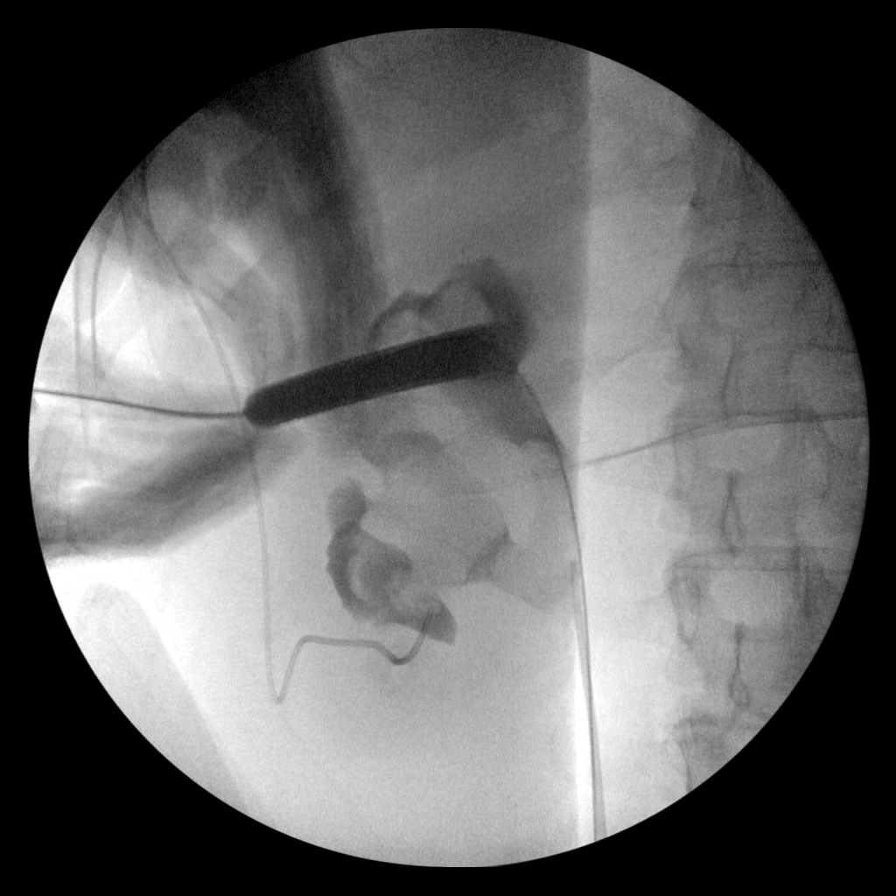
[im 3/7]
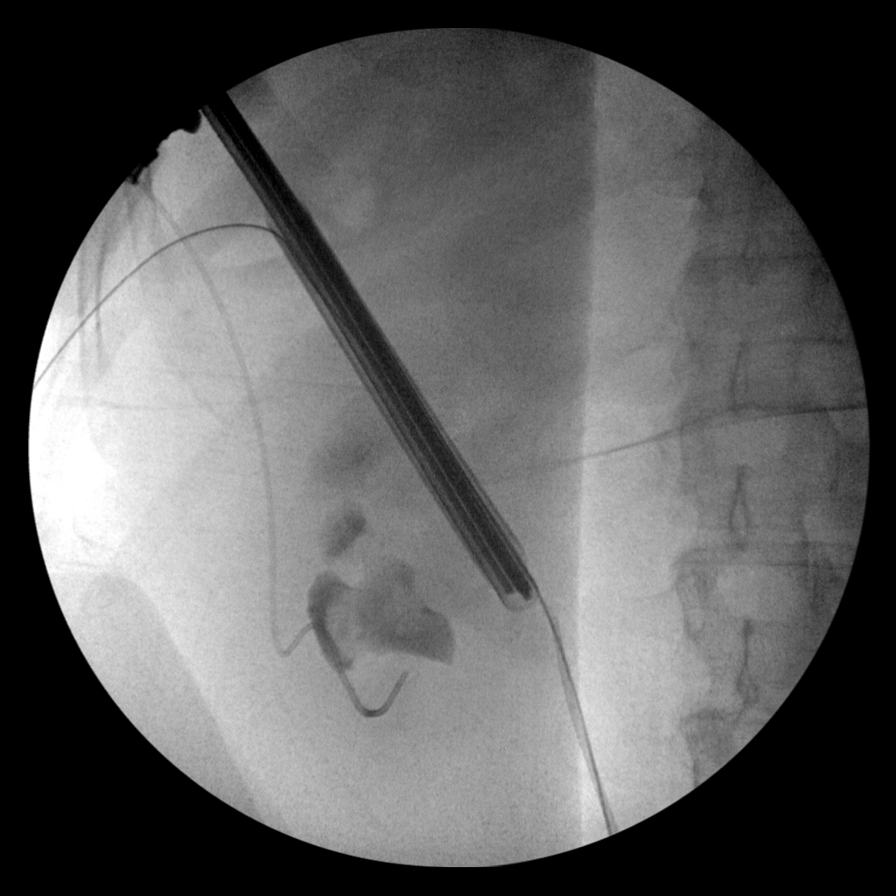
[im 4/7]
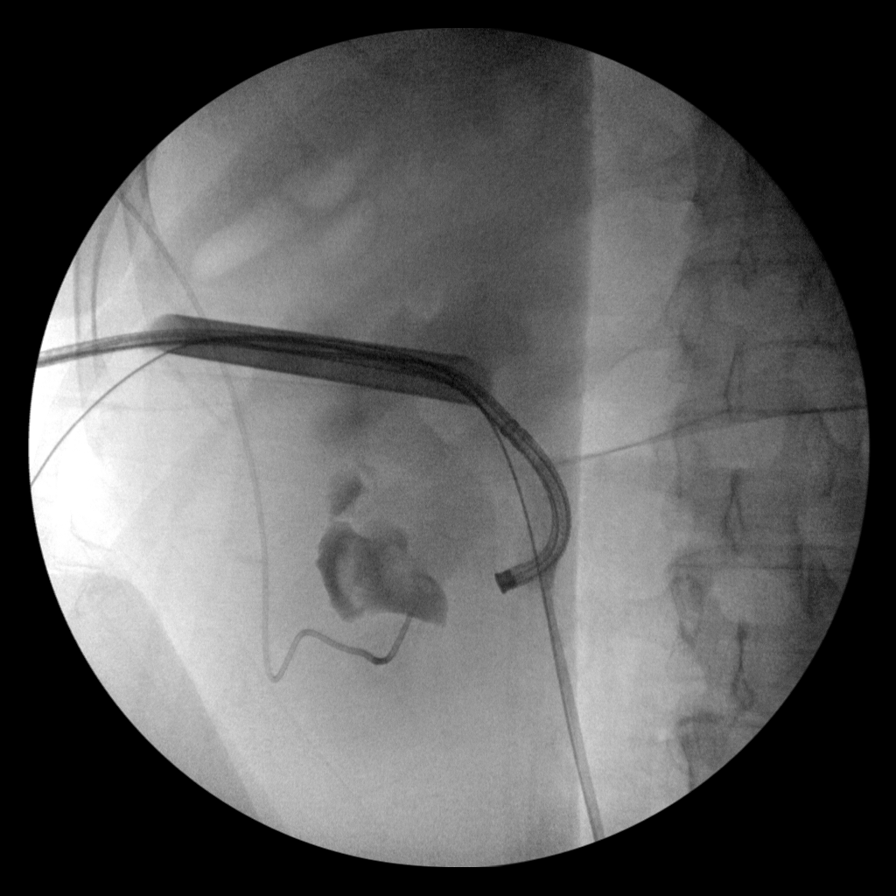
[im 5/7]
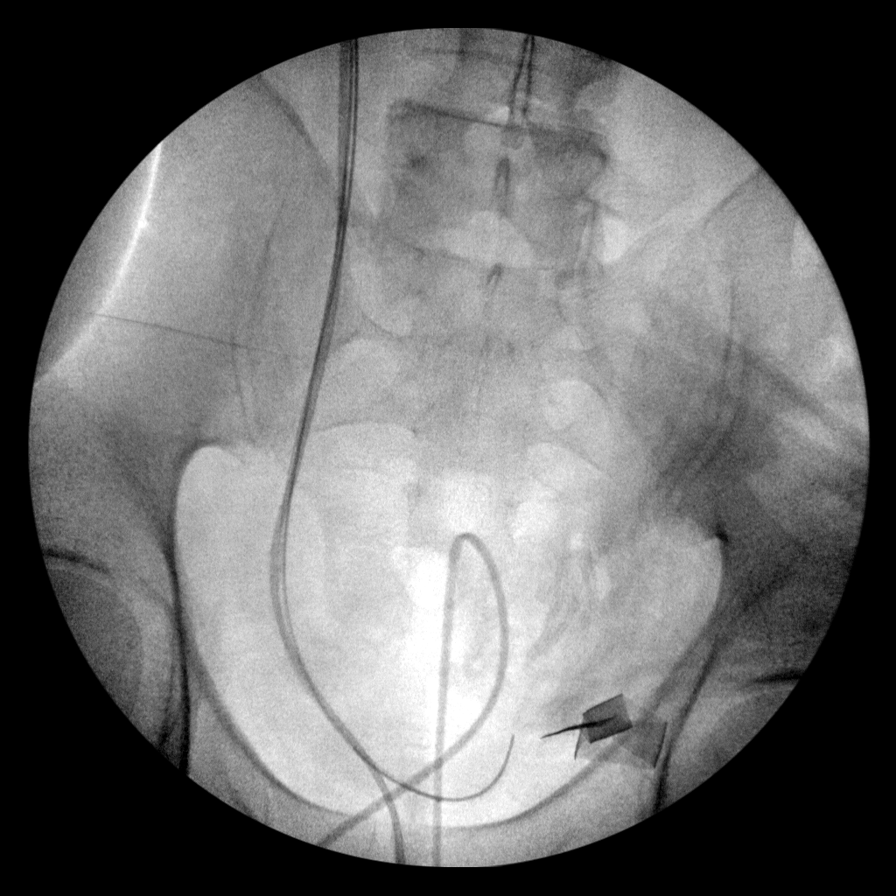
[im 6/7]
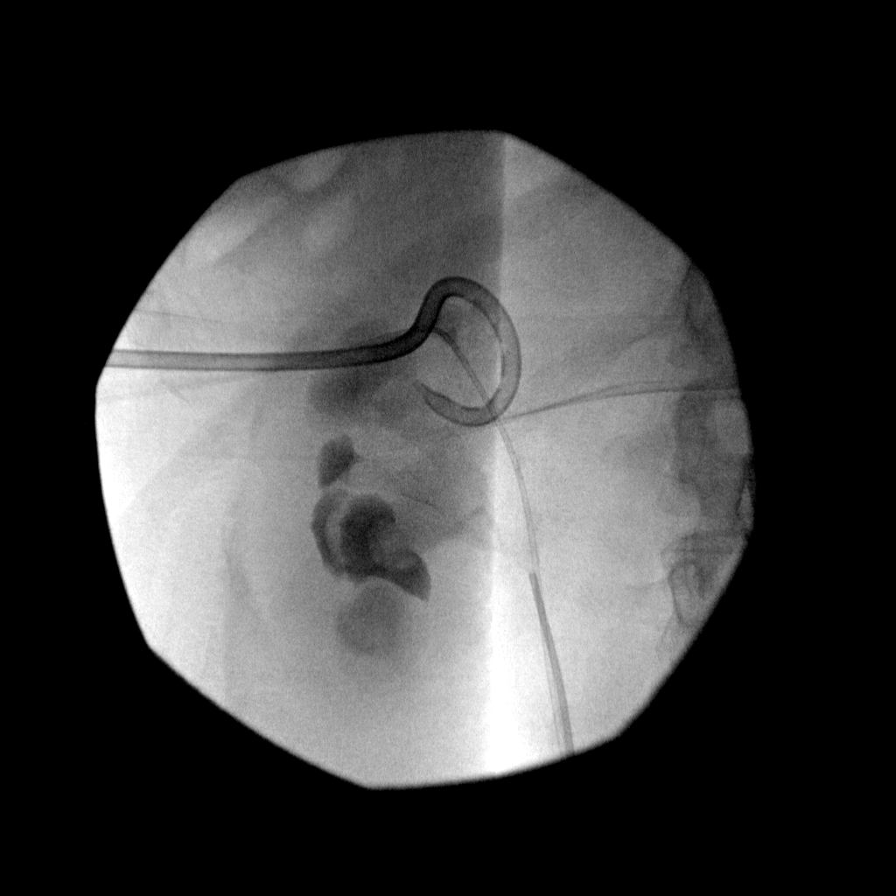
[im 7/7]
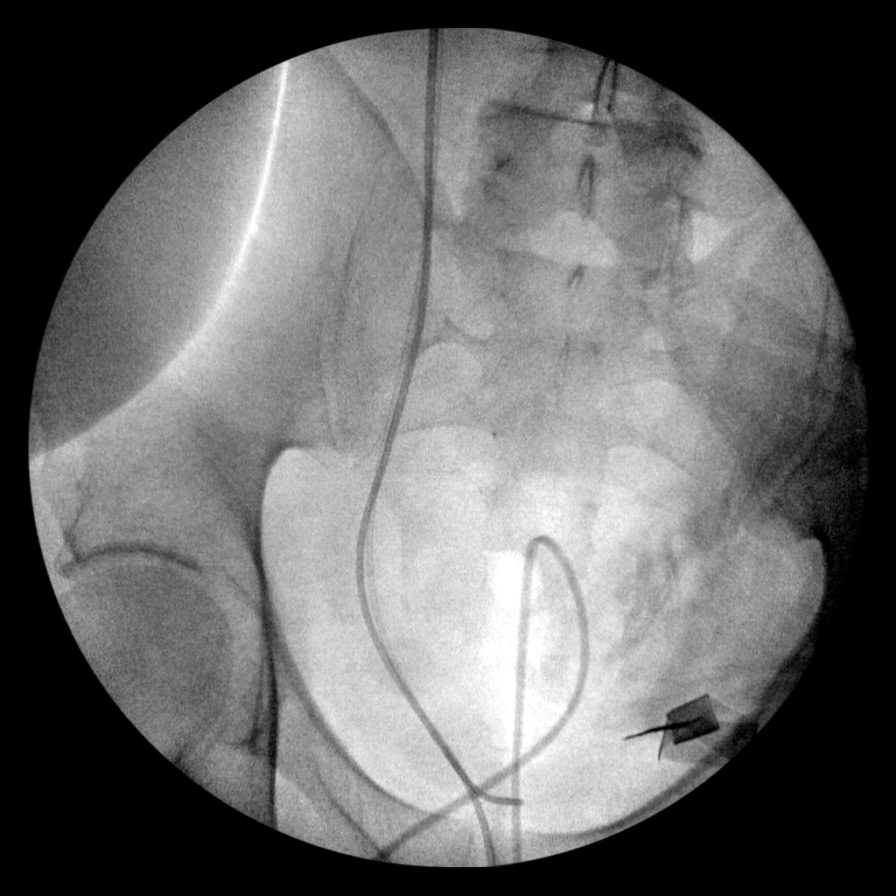

[7 of 7 positions shown; findings below may reference images not displayed]

FINDINGS: Seven intraoperative C-arm views submitted for review after surgery.

Films are not marked with regard to left or right side. On the prior
CT scan, left staghorn calculus is noted. History also states
left-sided stone removal.

A left ureteral stent is in place. Multiple large renal calculi with
hydronephrosis.

Nephrostomy tract balloon dilatation follow by placement rigid and
subsequently flexible scope. Some the stones appear
fragmented/removed. Percutaneous nephrostomy tube placing the upper
pole. Not all the stones have been removed on the last contrast
filled imaging of the left proximal collecting system.
IMPRESSION: Placement of left percutaneous nephrostomy tube after stone
fragmentation/partial removal.

## 2014-03-17 IMAGING — CR DG CHEST 1V PORT
1 series · 1 of 1 positions shown · non-contrast
Comparison: [DATE] chest x-ray.

CLINICAL DATA: Post left percutaneous nephrostomy placement. Rule
out pneumothorax.

EXAM:
PORTABLE CHEST - 1 VIEW

[AP]
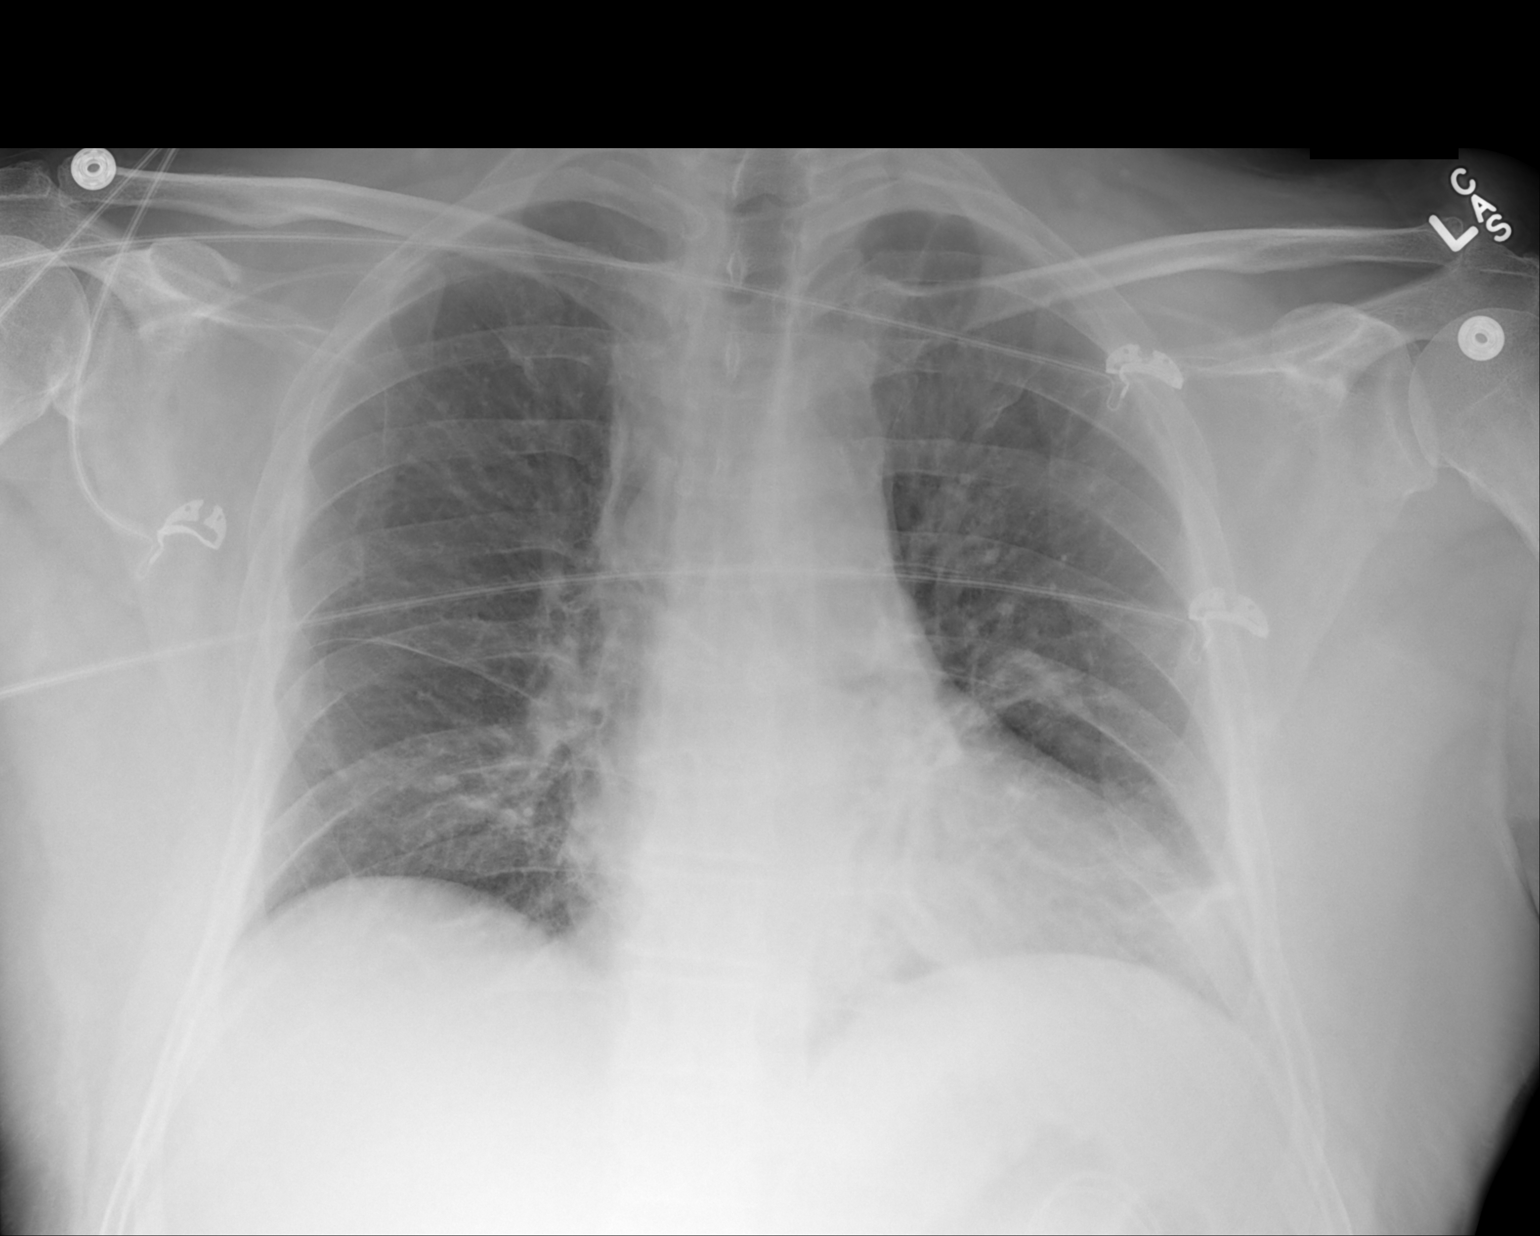

[1 of 1 positions shown; findings below may reference images not displayed]

FINDINGS: No evidence gross pneumothorax.

Left base subsegmental atelectasis.

Cardiomegaly.

Pulmonary vascular prominence most notable centrally.
IMPRESSION: No evidence gross pneumothorax.

Left base subsegmental atelectasis.

Cardiomegaly.

Pulmonary vascular prominence.

## 2014-03-17 IMAGING — RF DG RETROGRADE-URETHROGRAM
1 series · 3 of 3 positions shown · non-contrast
Comparison: CT SCAN DATED [DATE]

CLINICAL DATA: Bilateral hydronephrosis.

EXAM:
INTRAOPERATIVE LEFT RETROGRADE UROGRAPHY, DG C-ARM 61-120 MIN
TECHNIQUE: Images were obtained with the C-arm fluoroscopic device
intraoperatively and submitted for interpretation post-operatively.
Please see the procedural report for the amount of contrast and the
fluoroscopy time utilized.

[Series 1: run · 3 of 3 slices shown]
[im 1/3]
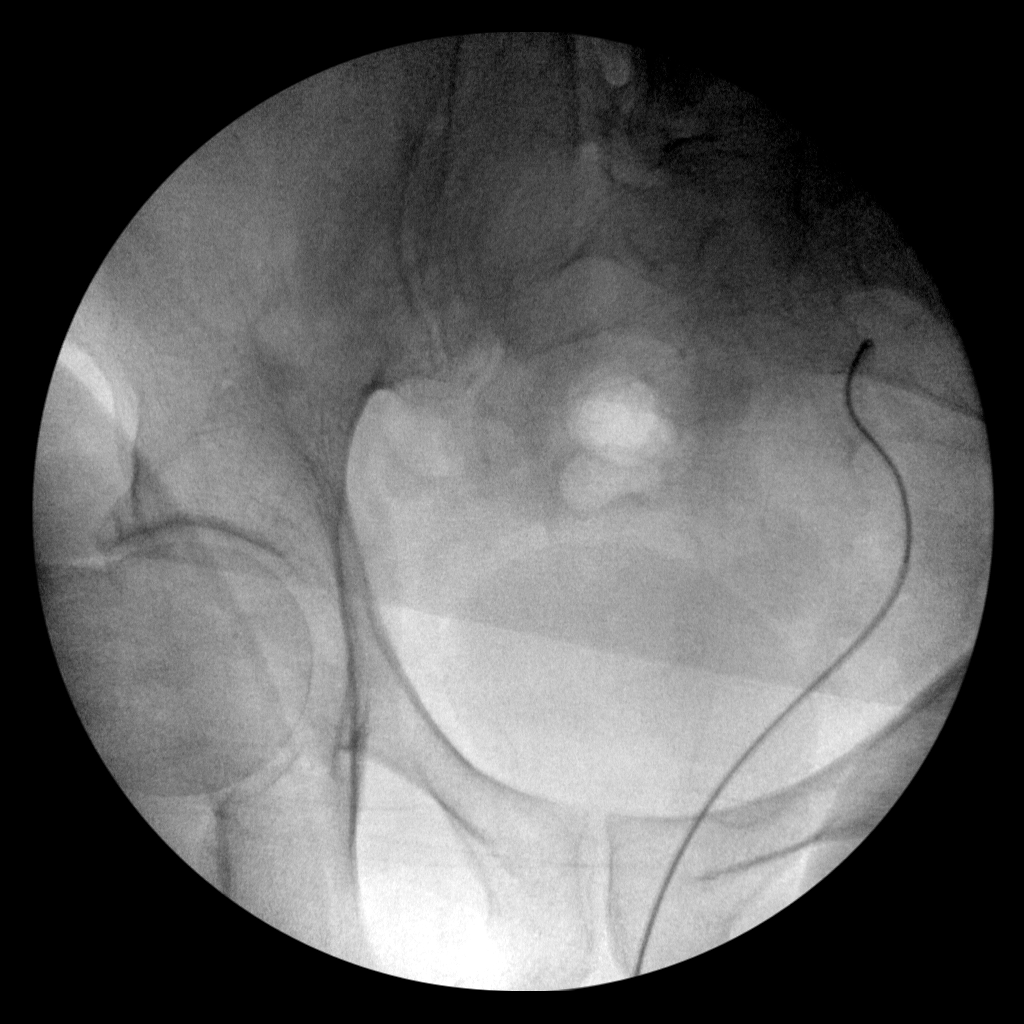
[im 2/3]
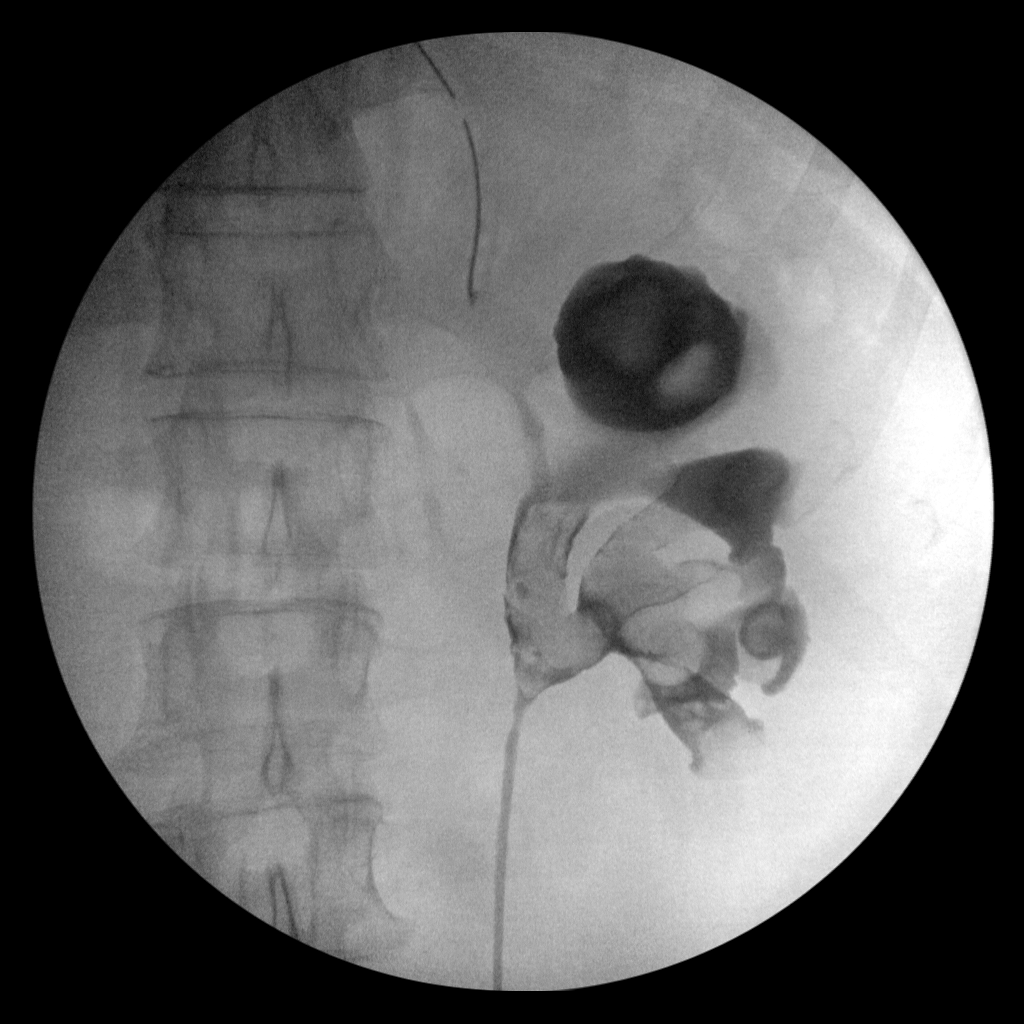
[im 3/3]
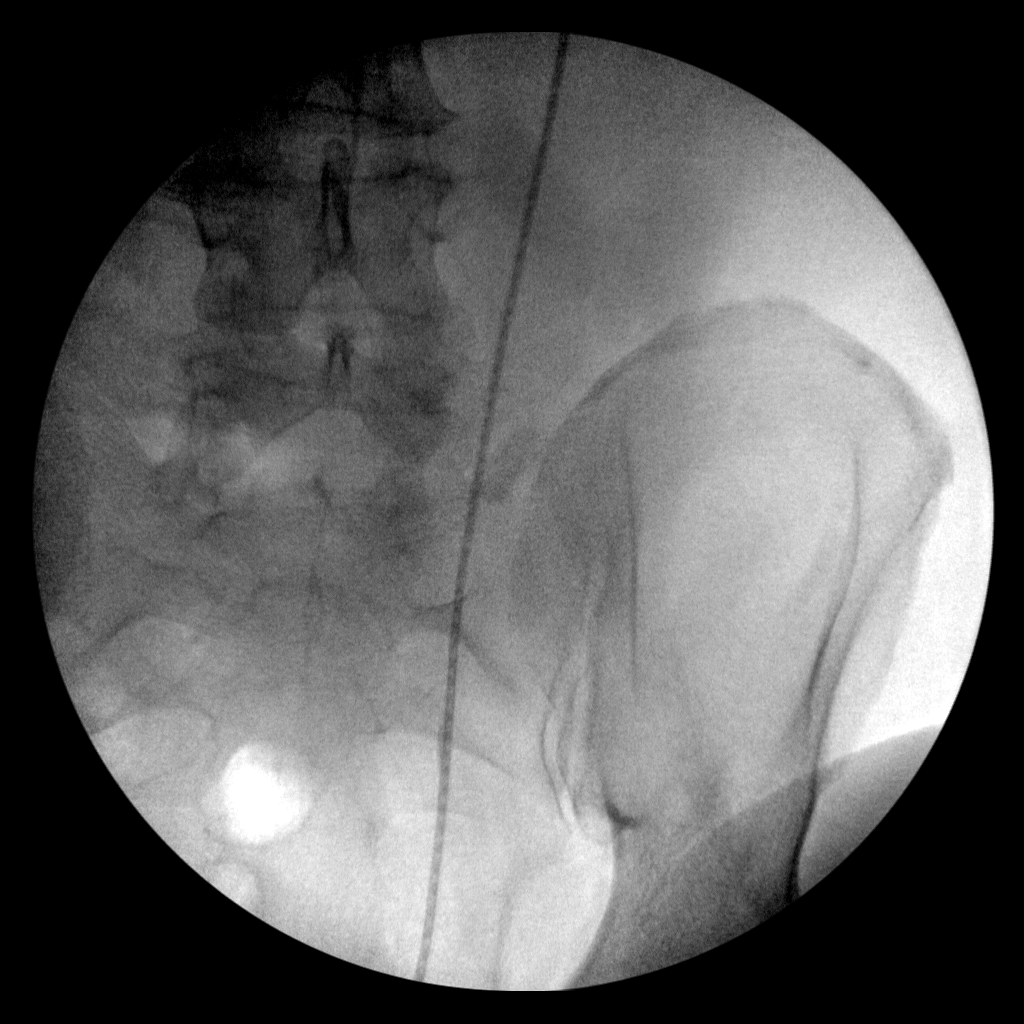

[3 of 3 positions shown; findings below may reference images not displayed]

FINDINGS: Three C-arm images demonstrate the left ureteral catheter in place.
Injection in the dilated pelvicaliceal system demonstrates extensive
filling defects consistent with a large staghorn calculus.
IMPRESSION: Large staghorn calculus in the left kidney.

## 2014-03-17 SURGERY — NEPHROLITHOTOMY PERCUTANEOUS
Anesthesia: General | Laterality: Left

## 2014-03-17 MED ORDER — ONDANSETRON HCL 4 MG/2ML IJ SOLN
INTRAMUSCULAR | Status: AC
  Filled 2014-03-17: qty 2

## 2014-03-17 MED ORDER — GLYCOPYRROLATE 0.2 MG/ML IJ SOLN
INTRAMUSCULAR | Status: AC
  Filled 2014-03-17: qty 3

## 2014-03-17 MED ORDER — INSULIN ASPART 100 UNIT/ML ~~LOC~~ SOLN
0.0000 [IU] | Freq: Three times a day (TID) | SUBCUTANEOUS | Status: DC
  Administered 2014-03-18: 3 [IU] via SUBCUTANEOUS
  Administered 2014-03-18 – 2014-03-19 (×2): 2 [IU] via SUBCUTANEOUS
  Administered 2014-03-20 (×3): 3 [IU] via SUBCUTANEOUS

## 2014-03-17 MED ORDER — PROPOFOL 10 MG/ML IV BOLUS
INTRAVENOUS | Status: AC
  Filled 2014-03-17: qty 20

## 2014-03-17 MED ORDER — SODIUM CHLORIDE 0.9 % IR SOLN
Status: DC | PRN
  Administered 2014-03-17: 1000 mL

## 2014-03-17 MED ORDER — ONDANSETRON HCL 4 MG/2ML IJ SOLN: 4.0000 mg | INTRAMUSCULAR | Status: DC | PRN

## 2014-03-17 MED ORDER — FENTANYL CITRATE 0.05 MG/ML IJ SOLN
INTRAMUSCULAR | Status: AC
  Filled 2014-03-17: qty 5

## 2014-03-17 MED ORDER — CISATRACURIUM BESYLATE (PF) 10 MG/5ML IV SOLN
INTRAVENOUS | Status: DC | PRN
  Administered 2014-03-17: 4 mg via INTRAVENOUS
  Administered 2014-03-17 (×2): 2 mg via INTRAVENOUS
  Administered 2014-03-17: 10 mg via INTRAVENOUS
  Administered 2014-03-17: 2 mg via INTRAVENOUS

## 2014-03-17 MED ORDER — SODIUM CHLORIDE 0.9 % IR SOLN
Status: DC | PRN
  Administered 2014-03-17: 24000 mL

## 2014-03-17 MED ORDER — LIDOCAINE HCL (CARDIAC) 20 MG/ML IV SOLN
INTRAVENOUS | Status: AC
  Filled 2014-03-17: qty 5

## 2014-03-17 MED ORDER — SENNA 8.6 MG PO TABS
1.0000 | ORAL_TABLET | Freq: Two times a day (BID) | ORAL | Status: DC
  Administered 2014-03-17 – 2014-03-20 (×7): 8.6 mg via ORAL
  Filled 2014-03-17 (×7): qty 1

## 2014-03-17 MED ORDER — DOCUSATE SODIUM 100 MG PO CAPS
100.0000 mg | ORAL_CAPSULE | Freq: Two times a day (BID) | ORAL | Status: DC
  Administered 2014-03-17 – 2014-03-20 (×7): 100 mg via ORAL
  Filled 2014-03-17 (×11): qty 1

## 2014-03-17 MED ORDER — EPHEDRINE SULFATE 50 MG/ML IJ SOLN
INTRAMUSCULAR | Status: DC | PRN
  Administered 2014-03-17: 5 mg via INTRAVENOUS
  Administered 2014-03-17: 10 mg via INTRAVENOUS
  Administered 2014-03-17: 5 mg via INTRAVENOUS

## 2014-03-17 MED ORDER — FENTANYL CITRATE 0.05 MG/ML IJ SOLN
INTRAMUSCULAR | Status: DC | PRN
  Administered 2014-03-17 (×8): 50 ug via INTRAVENOUS

## 2014-03-17 MED ORDER — POTASSIUM CHLORIDE IN NACL 20-0.9 MEQ/L-% IV SOLN
INTRAVENOUS | Status: AC
  Administered 2014-03-17: 1000 mL
  Filled 2014-03-17: qty 1000

## 2014-03-17 MED ORDER — DEXAMETHASONE SODIUM PHOSPHATE 10 MG/ML IJ SOLN
INTRAMUSCULAR | Status: AC
  Filled 2014-03-17: qty 1

## 2014-03-17 MED ORDER — OXYCODONE HCL 5 MG PO TABS
5.0000 mg | ORAL_TABLET | ORAL | Status: DC | PRN
  Administered 2014-03-18 – 2014-03-21 (×6): 5 mg via ORAL
  Filled 2014-03-17 (×6): qty 1

## 2014-03-17 MED ORDER — HYDROMORPHONE HCL PF 1 MG/ML IJ SOLN
0.5000 mg | INTRAMUSCULAR | Status: DC | PRN
  Administered 2014-03-17 – 2014-03-21 (×24): 1 mg via INTRAVENOUS
  Filled 2014-03-17 (×25): qty 1

## 2014-03-17 MED ORDER — ACETAMINOPHEN 10 MG/ML IV SOLN
1000.0000 mg | Freq: Once | INTRAVENOUS | Status: AC
  Administered 2014-03-17: 1000 mg via INTRAVENOUS
  Filled 2014-03-17: qty 100

## 2014-03-17 MED ORDER — MIDAZOLAM HCL 2 MG/2ML IJ SOLN
INTRAMUSCULAR | Status: AC
  Filled 2014-03-17: qty 2

## 2014-03-17 MED ORDER — CISATRACURIUM BESYLATE 20 MG/10ML IV SOLN
INTRAVENOUS | Status: AC
  Filled 2014-03-17: qty 10

## 2014-03-17 MED ORDER — HYDROMORPHONE HCL PF 1 MG/ML IJ SOLN
0.2500 mg | INTRAMUSCULAR | Status: DC | PRN
  Administered 2014-03-17 (×2): 0.5 mg via INTRAVENOUS

## 2014-03-17 MED ORDER — LIDOCAINE HCL (CARDIAC) 20 MG/ML IV SOLN
INTRAVENOUS | Status: DC | PRN
  Administered 2014-03-17: 75 mg via INTRAVENOUS

## 2014-03-17 MED ORDER — NEOSTIGMINE METHYLSULFATE 10 MG/10ML IV SOLN
INTRAVENOUS | Status: DC | PRN
  Administered 2014-03-17: 5 mg via INTRAVENOUS

## 2014-03-17 MED ORDER — OXYCODONE HCL 5 MG PO TABS: 5.0000 mg | ORAL_TABLET | Freq: Once | ORAL | Status: DC | PRN

## 2014-03-17 MED ORDER — ONDANSETRON HCL 4 MG/2ML IJ SOLN
INTRAMUSCULAR | Status: DC | PRN
  Administered 2014-03-17 (×2): 2 mg via INTRAVENOUS

## 2014-03-17 MED ORDER — HYDROMORPHONE HCL PF 1 MG/ML IJ SOLN
INTRAMUSCULAR | Status: AC
  Administered 2014-03-17: 1 mg
  Filled 2014-03-17: qty 1

## 2014-03-17 MED ORDER — SUCCINYLCHOLINE CHLORIDE 20 MG/ML IJ SOLN
INTRAMUSCULAR | Status: DC | PRN
  Administered 2014-03-17: 140 mg via INTRAVENOUS

## 2014-03-17 MED ORDER — PROMETHAZINE HCL 25 MG/ML IJ SOLN
6.2500 mg | INTRAMUSCULAR | Status: DC | PRN
  Administered 2014-03-17: 6.25 mg via INTRAVENOUS

## 2014-03-17 MED ORDER — OXYCODONE HCL 5 MG/5ML PO SOLN
5.0000 mg | Freq: Once | ORAL | Status: DC | PRN
  Filled 2014-03-17: qty 5

## 2014-03-17 MED ORDER — PROPOFOL 10 MG/ML IV BOLUS
INTRAVENOUS | Status: DC | PRN
  Administered 2014-03-17: 200 mg via INTRAVENOUS

## 2014-03-17 MED ORDER — PHENYLEPHRINE HCL 10 MG/ML IJ SOLN
INTRAMUSCULAR | Status: DC | PRN
  Administered 2014-03-17 (×6): 40 ug via INTRAVENOUS

## 2014-03-17 MED ORDER — IOHEXOL 300 MG/ML  SOLN
INTRAMUSCULAR | Status: DC | PRN
  Administered 2014-03-17: 90 mL

## 2014-03-17 MED ORDER — STERILE WATER FOR IRRIGATION IR SOLN
Status: DC | PRN
  Administered 2014-03-17: 1000 mL

## 2014-03-17 MED ORDER — ACETAMINOPHEN 500 MG PO TABS
1000.0000 mg | ORAL_TABLET | Freq: Three times a day (TID) | ORAL | Status: AC
  Administered 2014-03-17 – 2014-03-18 (×3): 1000 mg via ORAL
  Filled 2014-03-17 (×3): qty 2

## 2014-03-17 MED ORDER — LACTATED RINGERS IV SOLN
INTRAVENOUS | Status: DC
  Administered 2014-03-17: 1000 mL via INTRAVENOUS
  Administered 2014-03-17 (×2): via INTRAVENOUS

## 2014-03-17 MED ORDER — INSULIN ASPART 100 UNIT/ML ~~LOC~~ SOLN
4.0000 [IU] | Freq: Three times a day (TID) | SUBCUTANEOUS | Status: DC
  Administered 2014-03-18 – 2014-03-20 (×6): 4 [IU] via SUBCUTANEOUS

## 2014-03-17 MED ORDER — MIDAZOLAM HCL 5 MG/5ML IJ SOLN
INTRAMUSCULAR | Status: DC | PRN
  Administered 2014-03-17 (×2): 1 mg via INTRAVENOUS

## 2014-03-17 MED ORDER — GLYCOPYRROLATE 0.2 MG/ML IJ SOLN
INTRAMUSCULAR | Status: DC | PRN
  Administered 2014-03-17: .6 mg via INTRAVENOUS

## 2014-03-17 MED ORDER — MEPERIDINE HCL 50 MG/ML IJ SOLN
6.2500 mg | INTRAMUSCULAR | Status: DC | PRN
Start: 2014-03-17 — End: 2014-03-17

## 2014-03-17 MED ORDER — ATORVASTATIN CALCIUM 10 MG PO TABS
10.0000 mg | ORAL_TABLET | Freq: Every evening | ORAL | Status: DC
  Administered 2014-03-17 – 2014-03-20 (×3): 10 mg via ORAL
  Filled 2014-03-17 (×5): qty 1

## 2014-03-17 MED ORDER — AMLODIPINE BESYLATE 2.5 MG PO TABS
2.5000 mg | ORAL_TABLET | Freq: Every morning | ORAL | Status: DC
  Administered 2014-03-18 – 2014-03-20 (×3): 2.5 mg via ORAL
  Filled 2014-03-17 (×5): qty 1

## 2014-03-17 MED ORDER — POTASSIUM CHLORIDE IN NACL 20-0.9 MEQ/L-% IV SOLN
INTRAVENOUS | Status: DC
Start: 2014-03-17 — End: 2014-03-18
  Administered 2014-03-17 – 2014-03-18 (×2): via INTRAVENOUS
  Filled 2014-03-17 (×2): qty 1000

## 2014-03-17 MED ORDER — PROMETHAZINE HCL 25 MG/ML IJ SOLN
INTRAMUSCULAR | Status: AC
Start: 2014-03-17 — End: 2014-03-18
  Filled 2014-03-17: qty 1

## 2014-03-17 MED ORDER — NEOSTIGMINE METHYLSULFATE 10 MG/10ML IV SOLN
INTRAVENOUS | Status: AC
  Filled 2014-03-17: qty 1

## 2014-03-17 MED ORDER — GABAPENTIN 100 MG PO CAPS
100.0000 mg | ORAL_CAPSULE | Freq: Every day | ORAL | Status: DC
  Filled 2014-03-17: qty 1

## 2014-03-17 SURGICAL SUPPLY — 60 items
APL SKNCLS STERI-STRIP NONHPOA (GAUZE/BANDAGES/DRESSINGS) ×4
BAG URINE DRAINAGE (UROLOGICAL SUPPLIES) ×6 IMPLANT
BASKET ZERO TIP NITINOL 2.4FR (BASKET) ×2 IMPLANT
BENZOIN TINCTURE PRP APPL 2/3 (GAUZE/BANDAGES/DRESSINGS) ×8 IMPLANT
BLADE SURG 15 STRL LF DISP TIS (BLADE) ×2 IMPLANT
BLADE SURG 15 STRL SS (BLADE) ×4
BSKT STON RTRVL ZERO TP 2.4FR (BASKET)
CATCHER STONE W/TUBE ADAPTER (UROLOGICAL SUPPLIES) ×2 IMPLANT
CATH BEACON 5.038 65CM KMP-01 (CATHETERS) ×8 IMPLANT
CATH FOLEY 2W COUNCIL 20FR 5CC (CATHETERS) IMPLANT
CATH FOLEY 2WAY SLVR  5CC 16FR (CATHETERS) ×2
CATH FOLEY 2WAY SLVR 5CC 16FR (CATHETERS) ×2 IMPLANT
CATH INTERMIT  6FR 70CM (CATHETERS) ×4 IMPLANT
CATH MULTI PURPOSE 16FR DRAIN (STENTS) ×2 IMPLANT
CATH ROBINSON RED A/P 20FR (CATHETERS) IMPLANT
CATH X-FORCE N30 NEPHROSTOMY (TUBING) ×4 IMPLANT
CHLORAPREP W/TINT 26ML (MISCELLANEOUS) ×6 IMPLANT
COVER SURGICAL LIGHT HANDLE (MISCELLANEOUS) ×4 IMPLANT
DEVICE INFLATION 20CC 30ATM (MISCELLANEOUS) IMPLANT
DRAPE C-ARM 42X120 X-RAY (DRAPES) ×4 IMPLANT
DRAPE CAMERA CLOSED 9X96 (DRAPES) ×8 IMPLANT
DRAPE LG THREE QUARTER DISP (DRAPES) ×4 IMPLANT
DRAPE LINGEMAN PERC (DRAPES) ×4 IMPLANT
DRAPE SURG IRRIG POUCH 19X23 (DRAPES) ×4 IMPLANT
DRSG PAD ABDOMINAL 8X10 ST (GAUZE/BANDAGES/DRESSINGS) ×6 IMPLANT
DRSG TEGADERM 4X4.75 (GAUZE/BANDAGES/DRESSINGS) ×2 IMPLANT
DRSG TEGADERM 8X12 (GAUZE/BANDAGES/DRESSINGS) ×6 IMPLANT
FIBER LASER FLEXIVA 1000 (UROLOGICAL SUPPLIES) IMPLANT
FIBER LASER FLEXIVA 550 (UROLOGICAL SUPPLIES) IMPLANT
GAUZE SPONGE 4X4 12PLY STRL (GAUZE/BANDAGES/DRESSINGS) ×3 IMPLANT
GLOVE BIOGEL M STRL SZ7.5 (GLOVE) ×12 IMPLANT
GOWN STRL REUS W/TWL XL LVL3 (GOWN DISPOSABLE) ×12 IMPLANT
GUIDEWIRE AMPLAZ .035X145 (WIRE) ×10 IMPLANT
GUIDEWIRE ANG ZIPWIRE 035X150 (WIRE) IMPLANT
GUIDEWIRE ANG ZIPWIRE 038X150 (WIRE) ×8 IMPLANT
GUIDEWIRE STR DUAL SENSOR (WIRE) ×2 IMPLANT
KIT BASIN OR (CUSTOM PROCEDURE TRAY) ×4 IMPLANT
MANIFOLD NEPTUNE II (INSTRUMENTS) ×4 IMPLANT
NDL TROCAR 18X15 ECHO (NEEDLE) IMPLANT
NDL TROCAR 18X20 (NEEDLE) IMPLANT
NEEDLE TROCAR 18X15 ECHO (NEEDLE) IMPLANT
NEEDLE TROCAR 18X20 (NEEDLE) ×4 IMPLANT
NS IRRIG 1000ML POUR BTL (IV SOLUTION) ×2 IMPLANT
PACK BASIC VI WITH GOWN DISP (CUSTOM PROCEDURE TRAY) ×4 IMPLANT
PACK CYSTO (CUSTOM PROCEDURE TRAY) ×4 IMPLANT
PROBE LITHOCLAST ULTRA 3.8X403 (UROLOGICAL SUPPLIES) ×2 IMPLANT
PROBE PNEUMATIC 1.0MMX570MM (UROLOGICAL SUPPLIES) ×4 IMPLANT
SET IRRIG Y TYPE TUR BLADDER L (SET/KITS/TRAYS/PACK) ×4 IMPLANT
SET WARMING FLUID IRRIGATION (MISCELLANEOUS) IMPLANT
SPONGE GAUZE 4X4 12PLY (GAUZE/BANDAGES/DRESSINGS) ×2 IMPLANT
SPONGE LAP 4X18 X RAY DECT (DISPOSABLE) ×4 IMPLANT
STONE CATCHER W/TUBE ADAPTER (UROLOGICAL SUPPLIES) ×4 IMPLANT
SUT SILK 2 0 30  PSL (SUTURE) ×4
SUT SILK 2 0 30 PSL (SUTURE) ×2 IMPLANT
SYRINGE 12CC LL (MISCELLANEOUS) ×6 IMPLANT
SYRINGE 20CC LL (MISCELLANEOUS) ×8 IMPLANT
TOWEL OR 17X26 10 PK STRL BLUE (TOWEL DISPOSABLE) ×4 IMPLANT
TUBING CONNECTING 10 (TUBING) ×8 IMPLANT
TUBING CONNECTING 10' (TUBING) ×2
WATER STERILE IRR 1500ML POUR (IV SOLUTION) ×2 IMPLANT

## 2014-03-17 NOTE — Anesthesia Postprocedure Evaluation (Signed)
Anesthesia Post Note  Patient: [name]  Procedure(s) Performed: Procedure(s) (LRB): NEPHROLITHOTOMY PERCUTANEOUS with access (Left) CYSTOSCOPY WITH RIGHT STENT PULL AND LEFT STENT REPLACEMENT (Bilateral)  Anesthesia type: General  Patient location: PACU  Post pain: Pain level controlled  Post assessment: Post-op Vital signs reviewed  Last Vitals: BP 110/57  Pulse 80  Temp(Src) 36.4 C (Oral)  Resp 14  Ht 5\' 10"  (1.778 m)  Wt 195 lb (88.451 kg)  BMI 27.98 kg/m2  SpO2 100%  Post vital signs: Reviewed  Level of consciousness: sedated  Complications: No apparent anesthesia complications

## 2014-03-17 NOTE — H&P (Signed)
[name] is an 58 y.o. male.    Chief Complaint: Pre-op Left First Stage Percutaneous Nephrostolithotomy, Right Ureteral Stent Pull  HPI:    1 - Lt > Rt Large Volume Nephrolithiasis - s/p cysto, Rt ureteroscopy / laser / stent for Rt ureteral stone 01/2014 and now presents for left PCNL for lare left partial staghorn stone.   PMH sig for appy, IDDM2. No CV disease. Pt's PCP is Dr. Harl Favor in [place].   Today [name] is seen to proceed with Left first stage PCNL and Rt stent removal. No interval fevers.   Past Medical History  Diagnosis Date  . Kidney stone   . Diabetes mellitus   . Hypertension   . Arthritis   . Peripheral neuropathy     left fifth finger, left toe    Past Surgical History  Procedure Laterality Date  . Lithotripsy    . Uretral stent    . Cystoscopy with retrograde pyelogram, ureteroscopy and stent placement Bilateral 02/01/2014    Procedure: CYSTOSCOPY WITH BILATERAL RETROGRADE CYSTOSCOPY, BILATERAL RETROGRADE, PYELOGRAM, RIGHT URETEROSCOPY, STONE BASKETRY,  AND BILATERAL STENT PLACEMENT;  Surgeon: Alexis Frock, MD;  Location: [facility] ORS;  Service: Urology;  Laterality: Bilateral;  . Appendectomy      History reviewed. No pertinent family history. Social History:  reports that he has never smoked. He has never used smokeless tobacco. He reports that he drinks about 1.2 ounces of alcohol per week. He reports that he does not use illicit drugs.  Allergies: No Known Allergies  Medications Prior to Admission  Medication Sig Dispense Refill  . amLODipine (NORVASC) 2.5 MG tablet Take 2.5 mg by mouth every morning.       . Ascorbic Acid (VITAMIN C) 1000 MG tablet Take 1,000 mg by mouth daily.      Marland Kitchen aspirin EC 81 MG tablet Take 162 mg by mouth every morning.      Marland Kitchen atorvastatin (LIPITOR) 10 MG tablet Take 10 mg by mouth every evening.      . cholecalciferol (VITAMIN D) 1000 UNITS tablet Take 1,000 Units by mouth daily.      . Cyanocobalamin (VITAMIN B-12)  2500 MCG SUBL Place 1 tablet under the tongue daily.      Marland Kitchen gabapentin (NEURONTIN) 100 MG capsule Take 100 mg by mouth daily.       . insulin glargine (LANTUS) 100 UNIT/ML injection Inject 20 Units into the skin at bedtime.      Marland Kitchen lisinopril (PRINIVIL,ZESTRIL) 40 MG tablet Take 40 mg by mouth every morning.       . meloxicam (MOBIC) 15 MG tablet Take 15 mg by mouth daily.      . metFORMIN (GLUCOPHAGE) 500 MG tablet Take 500 mg by mouth 4 (four) times daily.      . Multiple Vitamins-Minerals (MULTIVITAMINS THER. W/MINERALS) TABS Take 1 tablet by mouth every morning.       . Omega 3 1200 MG CAPS Take 1,200 mg by mouth daily.      Marland Kitchen oxybutynin (DITROPAN) 5 MG tablet Take 1 tablet (5 mg total) by mouth every 8 (eight) hours as needed for bladder spasms. / stent discomfort  30 tablet  1  . pioglitazone (ACTOS) 15 MG tablet Take 15 mg by mouth daily.        Results for orders placed during the hospital encounter of 03/17/14 (from the past 48 hour(s))  GLUCOSE, CAPILLARY     Status: Abnormal   Collection Time    03/17/14  9:15 AM      Result Value Ref Range   Glucose-Capillary 119 (*) 70 - 99 mg/dL   Comment 1 Notify RN     No results found.  Review of Systems  Constitutional: Negative.  Negative for fever and chills.  HENT: Negative.   Eyes: Negative.   Respiratory: Negative.   Cardiovascular: Negative.   Gastrointestinal: Negative.  Negative for nausea and vomiting.  Genitourinary: Negative.   Musculoskeletal: Negative.   Skin: Negative.   Neurological: Negative.   Endo/Heme/Allergies: Negative.   Psychiatric/Behavioral: Negative.     Blood pressure 140/79, pulse 78, temperature 98.3 F (36.8 C), temperature source Oral, resp. rate 16, SpO2 97.00%. Physical Exam  Constitutional: He is oriented to person, place, and time. He appears well-developed and well-nourished.  HENT:  Head: Normocephalic and atraumatic.  Eyes: EOM are normal. Pupils are equal, round, and reactive to light.   Neck: Normal range of motion. Neck supple.  Cardiovascular: Normal rate.   Respiratory: Effort normal.  GI: Soft.  Genitourinary:  No CVAT  Musculoskeletal: Normal range of motion.  Neurological: He is alert and oriented to person, place, and time.  Skin: Skin is warm and dry.  Psychiatric: He has a normal mood and affect. His behavior is normal. Judgment and thought content normal.     Assessment/Plan   1 - Lt > Rt Large Volume Nephrolithiasis - Will proceed as planned with removal of right sided stent today (right side now stone free) and first stage left PCNL. He is scheduled for 2nd stage 6/24.   We discussed percutaneous nephrostolithotomy (PCNL) in detail including need for percutaneous access which is sometimes gained by the surgeon, and other times by radiology or through existing nephrostomy tubes if present. We specifically discussed that often times tubes remain in after surgery until we are confident all stone has been treated. We mentioned that staged surgery is needed in over 50% of cases of very large or complex stone. We then discussed general risks including bleeding, infection, damage to kidney / ureter / bladder, loss of kidney, as well as anesthetic risks and rare but serious surgical complications including DVT, PE, MI, and mortality.  [name], [name] 03/17/2014, 9:34 AM

## 2014-03-17 NOTE — Transfer of Care (Signed)
Immediate Anesthesia Transfer of Care Note  Patient: [name]  Procedure(s) Performed: Procedure(s): NEPHROLITHOTOMY PERCUTANEOUS with access (Left) CYSTOSCOPY WITH RIGHT STENT PULL AND LEFT STENT REPLACEMENT (Bilateral)  Patient Location: PACU  Anesthesia Type:General  Level of Consciousness: awake, oriented, patient cooperative, lethargic and responds to stimulation  Airway & Oxygen Therapy: Patient Spontanous Breathing and Patient connected to face mask oxygen  Post-op Assessment: Report given to PACU RN, Post -op Vital signs reviewed and stable and Patient moving all extremities  Post vital signs: Reviewed and stable  Complications: No apparent anesthesia complications

## 2014-03-17 NOTE — Anesthesia Preprocedure Evaluation (Signed)
Anesthesia Evaluation  Patient identified by MRN, date of birth, ID band Patient awake    Reviewed: Allergy & Precautions, H&P , NPO status , Patient's Chart, lab work & pertinent test results  Airway Mallampati: II TM Distance: >3 FB Neck ROM: Full    Dental no notable dental hx.    Pulmonary pneumonia -, resolved,  breath sounds clear to auscultation  Pulmonary exam normal       Cardiovascular Exercise Tolerance: Good hypertension, Pt. on medications Rhythm:Regular Rate:Normal     Neuro/Psych negative neurological ROS  negative psych ROS   GI/Hepatic negative GI ROS, Neg liver ROS,   Endo/Other  negative endocrine ROSdiabetes, Type 2, Oral Hypoglycemic Agents, Insulin Dependent  Renal/GU Renal disease     Musculoskeletal negative musculoskeletal ROS (+)   Abdominal   Peds  Hematology negative hematology ROS (+)   Anesthesia Other Findings   Reproductive/Obstetrics negative OB ROS                           Anesthesia Physical  Anesthesia Plan  ASA: III  Anesthesia Plan: General   Post-op Pain Management:    Induction: Intravenous  Airway Management Planned: Oral ETT  Additional Equipment:   Intra-op Plan:   Post-operative Plan: Extubation in OR  Informed Consent: I have reviewed the patients History and Physical, chart, labs and discussed the procedure including the risks, benefits and alternatives for the proposed anesthesia with the patient or authorized representative who has indicated his/her understanding and acceptance.   Dental advisory given  Plan Discussed with: CRNA  Anesthesia Plan Comments:         Anesthesia Quick Evaluation

## 2014-03-17 NOTE — Brief Op Note (Signed)
03/17/2014  4:08 PM  PATIENT:  [name]  58 y.o. male  PRE-OPERATIVE DIAGNOSIS:  LEFT STAGHORN KIDNEY STONE, BILATERAL RETAINED STENTS  POST-OPERATIVE DIAGNOSIS:  LEFT STAGHORN KIDNEY STONE, BILATERAL RETAINED  PROCEDURE:  Procedure(s): NEPHROLITHOTOMY PERCUTANEOUS with access (Left) CYSTOSCOPY WITH RIGHT STENT PULL AND LEFT STENT REPLACEMENT (Bilateral)  SURGEON:  Surgeon(s) and Role:    * Alexis Frock, MD - Primary  PHYSICIAN ASSISTANT:   ASSISTANTS: none   ANESTHESIA:   general  EBL:  Total I/O In: 1000 [I.V.:1000] Out: 25 [Blood:25]  BLOOD ADMINISTERED:none  DRAINS: foley to straight drain, left nephroureteral stent capped, left nephrostomy to straight drain.   LOCAL MEDICATIONS USED:  NONE  SPECIMEN:  Source of Specimen:  Left Renal Stone fragments  DISPOSITION OF SPECIMEN:  [facility] for compositional analysis  COUNTS:  YES  TOURNIQUET:  * No tourniquets in log *  DICTATION: .Other Dictation: Dictation Number [id number]  PLAN OF CARE: Admit to inpatient   PATIENT DISPOSITION:  PACU - hemodynamically stable.   Delay start of Pharmacological VTE agent (>24hrs) due to surgical blood loss or risk of bleeding: yes

## 2014-03-18 ENCOUNTER — Other Ambulatory Visit: Payer: Self-pay | Admitting: Urology

## 2014-03-18 ENCOUNTER — Encounter (HOSPITAL_COMMUNITY): Payer: Self-pay | Admitting: Urology

## 2014-03-18 LAB — GLUCOSE, CAPILLARY
GLUCOSE-CAPILLARY: 120 mg/dL — AB (ref 70–99)
Glucose-Capillary: 107 mg/dL — ABNORMAL HIGH (ref 70–99)
Glucose-Capillary: 143 mg/dL — ABNORMAL HIGH (ref 70–99)
Glucose-Capillary: 156 mg/dL — ABNORMAL HIGH (ref 70–99)
Glucose-Capillary: 113 mg/dL — ABNORMAL HIGH (ref 70–99)

## 2014-03-18 LAB — BASIC METABOLIC PANEL
BUN: 23 mg/dL (ref 6–23)
CO2: 25 mEq/L (ref 19–32)
CREATININE: 2.22 mg/dL — AB (ref 0.50–1.35)
Calcium: 8.7 mg/dL (ref 8.4–10.5)
Chloride: 101 mEq/L (ref 96–112)
GFR calc Af Amer: 36 mL/min — ABNORMAL LOW (ref >90)
GFR, EST NON AFRICAN AMERICAN: 31 mL/min — AB (ref >90)
Glucose, Bld: 122 mg/dL — ABNORMAL HIGH (ref 70–99)
Potassium: 4.6 mEq/L (ref 3.7–5.3)
SODIUM: 138 meq/L (ref 137–147)

## 2014-03-18 LAB — HEMOGLOBIN AND HEMATOCRIT, BLOOD
HEMATOCRIT: 32.2 % — AB (ref 39.0–52.0)
Hemoglobin: 10.6 g/dL — ABNORMAL LOW (ref 13.0–17.0)

## 2014-03-18 MED ORDER — SODIUM CHLORIDE 0.9 % IV SOLN
INTRAVENOUS | Status: DC
  Administered 2014-03-18 – 2014-03-20 (×4): via INTRAVENOUS

## 2014-03-18 MED ORDER — CEFTRIAXONE SODIUM 1 G IJ SOLR
1.0000 g | INTRAMUSCULAR | Status: DC
  Administered 2014-03-19 – 2014-03-20 (×2): 1 g via INTRAVENOUS
  Filled 2014-03-18 (×3): qty 10

## 2014-03-18 NOTE — Progress Notes (Signed)
1 Day Post-Op  Subjective:   1 - Lt > Rt Large Volume Nephrolithiasis - s/p cysto + right stent pull + left first stage percutaneous nephrostolithotomy 03/17/14 partial staghorn left stone via upper pole access.   2 - Creatinine Rise - Cr baseline < 1.5. Noted on AM labs 6/23 rise to 2.22. UOP acceptable. Received extended interval gentamicin 6/22 as part of peri-op prophylaxis.  Today Abbe Amsterdam is doing well. Some left flank pain as anticipated, but ambulatory and doing well with spirometry.    Objective: Vital signs in last 24 hours: Temp:  [98 F (36.7 C)-99.1 F (37.3 C)] 99.1 F (37.3 C) (06/23 1414) Pulse Rate:  [89-102] 102 (06/23 1414) Resp:  [18-20] 18 (06/23 1414) BP: (115-128)/(64-66) 128/66 mmHg (06/23 1414) SpO2:  [95 %-98 %] 95 % (06/23 1414) Last BM Date: 03/17/14  Intake/Output from previous day: 06/22 0701 - 06/23 0700 In: 3203.3 [P.O.:600; I.V.:2603.3] Out: 990 [Urine:965; Blood:25] Intake/Output this shift: Total I/O In: -  Out: 780 [Urine:780]  General appearance: alert, cooperative and appears stated age Head: Normocephalic, without obvious abnormality, atraumatic Nose: Nares normal. Septum midline. Mucosa normal. No drainage or sinus tenderness. Throat: lips, mucosa, and tongue normal; teeth and gums normal Neck: supple, symmetrical, trachea midline Back: symmetric, no curvature. ROM normal. No CVA tenderness. Resp: non-labored on room air, 2L on IS Chest wall: no tenderness Cardio: Nl rate this AM GI: soft, non-tender; bowel sounds normal; no masses,  no organomegaly Male genitalia: normal, foley c/d/i with light pink urien.  Extremities: extremities normal, atraumatic, no cyanosis or edema Pulses: 2+ and symmetric Skin: Skin color, texture, turgor normal. No rashes or lesions Neurologic: Grossly normal Incision/Wound: Lt flank dressing c/d/i. No large ecchymoses, neph tube with light pink urine.   Lab Results:   Recent Labs  03/17/14 1629  03/18/14 0349  HGB 10.7* 10.6*  HCT 31.5* 32.2*   BMET  Recent Labs  03/17/14 1052 03/18/14 0349  NA 141 138  K 4.5 4.6  CL  --  101  CO2  --  25  GLUCOSE 115* 122*  BUN  --  23  CREATININE  --  2.22*  CALCIUM  --  8.7   PT/INR No results found for this basename: LABPROT, INR,  in the last 72 hours ABG No results found for this basename: PHART, PCO2, PO2, HCO3,  in the last 72 hours  Studies/Results: Dg Abd 1 View  03/17/2014   CLINICAL DATA:  Left kidney stone removal.  EXAM: ABDOMEN - 1 VIEW  Fluoroscopic time: 20 min.  COMPARISON:  Three intraoperative C-arm views performed earlier in the day 03/17/2014. 02/01/2014 CT.  FINDINGS: Seven intraoperative C-arm views submitted for review after surgery.  Films are not marked with regard to left or right side. On the prior CT scan, left staghorn calculus is noted. History also states left-sided stone removal.  A left ureteral stent is in place. Multiple large renal calculi with hydronephrosis.  Nephrostomy tract balloon dilatation follow by placement rigid and subsequently flexible scope. Some the stones appear fragmented/removed. Percutaneous nephrostomy tube placing the upper pole. Not all the stones have been removed on the last contrast filled imaging of the left proximal collecting system.  IMPRESSION: Placement of left percutaneous nephrostomy tube after stone fragmentation/partial removal.   Electronically Signed   By: Chauncey Cruel M.D.   On: 03/17/2014 17:17   Dg Chest Port 1 View  03/17/2014   CLINICAL DATA:  Post left percutaneous nephrostomy placement. Rule out pneumothorax.  EXAM: PORTABLE CHEST - 1 VIEW  COMPARISON:  08/30/2011 chest x-ray.  FINDINGS: No evidence gross pneumothorax.  Left base subsegmental atelectasis.  Cardiomegaly.  Pulmonary vascular prominence most notable centrally.  IMPRESSION: No evidence gross pneumothorax.  Left base subsegmental atelectasis.  Cardiomegaly.  Pulmonary vascular prominence.    Electronically Signed   By: Chauncey Cruel M.D.   On: 03/17/2014 17:04   Dg Retrograde-urethrogram  03/17/2014   CLINICAL DATA:  Bilateral hydronephrosis.  EXAM: INTRAOPERATIVE LEFT RETROGRADE UROGRAPHY, DG C-ARM 61-120 MIN  TECHNIQUE: Images were obtained with the C-arm fluoroscopic device intraoperatively and submitted for interpretation post-operatively. Please see the procedural report for the amount of contrast and the fluoroscopy time utilized.  COMPARISON:  CT SCAN DATED 02/01/2014  FINDINGS: Three C-arm images demonstrate the left ureteral catheter in place. Injection in the dilated pelvicaliceal system demonstrates extensive filling defects consistent with a large staghorn calculus.  IMPRESSION: Large staghorn calculus in the left kidney.   Electronically Signed   By: Rozetta Nunnery M.D.   On: 03/17/2014 13:40   Dg C-arm 61-120 Min-no Report  03/17/2014   CLINICAL DATA:  Bilateral hydronephrosis.  EXAM: INTRAOPERATIVE LEFT RETROGRADE UROGRAPHY, DG C-ARM 61-120 MIN  TECHNIQUE: Images were obtained with the C-arm fluoroscopic device intraoperatively and submitted for interpretation post-operatively. Please see the procedural report for the amount of contrast and the fluoroscopy time utilized.  COMPARISON:  CT SCAN DATED 02/01/2014  FINDINGS: Three C-arm images demonstrate the left ureteral catheter in place. Injection in the dilated pelvicaliceal system demonstrates extensive filling defects consistent with a large staghorn calculus.  IMPRESSION: Large staghorn calculus in the left kidney.   Electronically Signed   By: Rozetta Nunnery M.D.   On: 03/17/2014 13:40    Anti-infectives: Anti-infectives   Start     Dose/Rate Route Frequency Ordered Stop   03/19/14 1000  cefTRIAXone (ROCEPHIN) 1 g in dextrose 5 % 50 mL IVPB     1 g 100 mL/hr over 30 Minutes Intravenous Every 24 hours 03/18/14 1458     03/17/14 0600  gentamicin (GARAMYCIN) 440 mg in dextrose 5 % 100 mL IVPB     440 mg 111 mL/hr over 60  Minutes Intravenous 60 min pre-op 03/16/14 1445 03/17/14 1145      Assessment/Plan:   1 - Lt > Rt Large Volume Nephrolithiasis - doing well s/p 1st stage surgery. Explained will take at least 1 more stage, preferably with new lower pole access if possible and will proceed tomorrow. NPO p 3AM.   2 - Creatinine Rise - DDX aminoglycoside effect v. Some systemic absorption from mild extrav along tract. Will begin rocephin for peri-op proph tomorrow AM instead and check BMP in AM. K removed from IVF.   LOS: 1 day    [facility], [place] 03/18/2014

## 2014-03-18 NOTE — Op Note (Signed)
NAMEKAYLEN, NGHIEM NO.:  [id number]  MEDICAL RECORD NO.:  [id number]  LOCATION:  5409                         FACILITY:  [facility]  PHYSICIAN:  Alexis Frock, MD     DATE OF BIRTH:  11/18/1955  DATE OF PROCEDURE:  03/17/2014 DATE OF DISCHARGE:                              OPERATIVE REPORT   DIAGNOSES:  Near-complete left staghorn kidney stone, history of right ureteral stone.  PROCEDURE: 1. Cystoscopy with right ureteral stent removal, left ureteral stent     change. 2. Left first stage percutaneous nephrostolithotomy stone greater than     2 cm. 3. Needle access, left kidney, and dilation of tract. 4. Placement of left nephrostomy tube. 5. Left antegrade nephrostogram with interpretation.  ASSISTANTS:  None.  SPECIMEN:  Left renal stone fragments for compositional analysis.  FINDINGS: 1. Unremarkable urinary bladder. 2. Large multifocal partial staghorn kidney stone occupying approximately 70%     of the left kidney. 3. Successful upper pole access and ablation of approximately 50%     of stone volume today. 4. Inability to obtain through-and-through access from lower pole     calyx.  The lower pole stone was not adressed at this time.  DRAINS: 1. Left nephroureteral stent capped. 2. Left 16-French nephrostomy tube straight drain. 3. Foley catheter, straight drain.  ESTIMATED BLOOD LOSS:  50 mL.  INDICATION:  Mr. Lakeman is a pleasant, 58 year old gentleman with history of recurrent nephrolithiasis.  He presented previously with right ureteral stone and left partial staghorn kidney stone.  He underwent right ureteroscopic stone manipulation and bilateral stenting for temporization last month.  He now presents for removal of his retained right ureteral stent and first stage percutaneous nephrostolithotomy with near complete left staghorn kidney stone.  Informed consent was obtained and placed in medical record.  PROCEDURE IN DETAIL:  The patient  being Viola Placeres was verified, procedure being cysto right stent change left first stage percutaneous nephrostolithotomy was confirmed.  Procedure was carried out.  Time-out was performed.  Intravenous antibiotics were administered.  General endotracheal anesthesia was introduced.  The patient initially placed into a low lithotomy position.  A sterile field was created by prepping and draping the patient's penis, perineum, and proximal thighs using iodine x3.  Next, cystourethroscopy was performed. A 22-French rigid cystoscope with 30-degree offset lens.  Inspection of the anterior and posterior urethra unremarkable.  Inspection of the urinary bladder revealed no diverticula, calcifications, papular lesions.  Distal stents were seen in situ.  The right ureteral stent was grasped, brought out in its entirety, set aside for discard.  The left ureteral stent was grasped, brought to the level of the urethral meatus, through which a 0.038 Sensor wire was advanced at the level of the kidney, and the left stent was exchanged for a 6-French open-ended catheter.  As he has externalized ureteral stent, the proximal end was in the renal pelvis, left retrograde pyelogram was seen.  Left retrograde pyelogram, there was a single left ureter with single sytem left kidney.  There was multifocal large filling defect in all segments of the kidney consistent with a near complete left staghorn kidney stone.  A 16-French Foley  catheter was placed and externalized stent was fashioned to this, connected to IV extension tubing, which was filled with some iodinated contrast.  The patient was then repositioned to complete prone position employing prone view, sequential compression devices, padding of his knees and ankles, and axillary rolls and chest rolls and approximately 10 degrees of Table flexion to maximze space between 12th rib and iliac crest.  Entire left flank was prepped and draped using  chlorhexidine gluconate.  Original plan was for upper and lower pole access.  It was felt that this would allow the most efficient Means of stone removal.  Then, attention was directed to the lower pole access.  The lower most calyx was identified using simultaneous retrograde pyelography and fluoroscopy, and using a Chiba needle at 15 degrees off center, lower pole calyx was cannulated as verified by efflux of urine and a 0.038 zip wire was advanced in this location.  A KMP catheter was advanced and multiple angulations performed to try to gain through-and-through access down the ureter; however, this was not successful likely due to complete impaction of stone in the lower pole. This was then set aside.  Operable access was again obtained using bulls eye technique using 18-gauge Chiba needle at 15 degrees off center.  At this time, with a supracostal approach between the 12th and 11th ribs gaining upper pole access, this did allow through-and-through placement of the zip wire down the level of the ureter, which was exchanged via the [place] catheter for a superstiff wire.  This tract was chosen for dilation, and a 30-French NephroMax balloon dilation apparatus was placed across the Upper pole calyx. .  NephroMax balloon dilation apparatus was inflated to pressure of 16 atmospheres for 90 seconds, a sheath was advanced across this calyx.  Next, rigid nephroscopy was performed.  This revealed excellent sheath placement in direct apposition of several large stones.  Next, Lithoclast dual ultrasound pneumatic energy was applied to these stones, fragmented them into smaller pieces, which were removed nephroscopically. Attention was then directed at continuing stone ablation towards the area of the renal pelvis via the upper pole access and this was quite successful.  Over the next 3 hours, Lithoclast energy was applied to the stone and sequentially moving more towards the area of the renal  pelvis, such that the previously placed externalized stent was encountered, thus corroborating renal pelvis and UPJ.  It was felt that approximately 50% of total stone volume had been removed and there was unblocking of the upper pole with these maneuvers.  He has got excellent access to the upper pole, so that repeat attention to lower pole access dilation was warranted.  The previously placed wire was used as a conduit for the KMP catheter.  Once again using multiple angulations, attempts were made to gain through-and-through access to the level of renal pelvis; however, this is not successful.  Another suitable calyx, one superior from this also was attempted.  A bull's eye technique, an 18-gauge Chiba needle, however, through-and-through access was not available either.  At this point, the patient had been positioned in prone position for over 3-1/2 hours and felt that focusing on lower pole within the lower pole access versus using single access technique at the next stage would be safest. Incision was made to place tubes and conclude the procedure today.  As such, a new long KMP catheter was advanced over the superstiff safety wires level of the urinary bladder acting as externalized nephroureteral stent.  A 16-French nephrostomy  tube was quelled in upper pole, acting as drainage for the left kidney.  Final antegrade nephrostogram was performed.  Final antegrade nephrostogram revealed excellent placement of the upper pole nephrostomy without excessive extravasation.  There was no evidence of medial perforation, and again as estimated, approximately 50% of the stone volume was removed today.  The nephroureteral stent was capped, but these tubes were sutured in place to the level of skin using interrupted silk followed by a percutaneous dressing.  Nephrostomy tube was connected to straight drain.  The patient was taken to postanesthesia care unit in stable condition.           ______________________________ Alexis Frock, MD     TM/MEDQ  D:  03/17/2014  T:  03/18/2014  Job:  [id number]

## 2014-03-19 ENCOUNTER — Inpatient Hospital Stay (HOSPITAL_COMMUNITY): Admission: RE | Admit: 2014-03-19 | Payer: BC Managed Care – PPO | Source: Ambulatory Visit | Admitting: Urology

## 2014-03-19 ENCOUNTER — Encounter (HOSPITAL_COMMUNITY)
Admission: RE | Disposition: A | Discharge status: Home / Self Care | Payer: Self-pay | Source: Ambulatory Visit | Attending: Urology

## 2014-03-19 ENCOUNTER — Inpatient Hospital Stay (HOSPITAL_COMMUNITY): Payer: BC Managed Care – PPO | Admitting: Anesthesiology

## 2014-03-19 ENCOUNTER — Inpatient Hospital Stay (HOSPITAL_COMMUNITY): Payer: BC Managed Care – PPO

## 2014-03-19 ENCOUNTER — Encounter (HOSPITAL_COMMUNITY): Payer: Self-pay

## 2014-03-19 HISTORY — PX: HOLMIUM LASER APPLICATION: SHX5852

## 2014-03-19 HISTORY — PX: NEPHROLITHOTOMY: SHX5134

## 2014-03-19 LAB — BASIC METABOLIC PANEL
BUN: 21 mg/dL (ref 6–23)
CALCIUM: 8.8 mg/dL (ref 8.4–10.5)
CO2: 22 meq/L (ref 19–32)
Chloride: 102 mEq/L (ref 96–112)
Creatinine, Ser: 1.89 mg/dL — ABNORMAL HIGH (ref 0.50–1.35)
GFR calc Af Amer: 44 mL/min — ABNORMAL LOW (ref >90)
GFR calc non Af Amer: 38 mL/min — ABNORMAL LOW (ref >90)
GLUCOSE: 164 mg/dL — AB (ref 70–99)
Potassium: 5.1 mEq/L (ref 3.7–5.3)
Sodium: 137 mEq/L (ref 137–147)

## 2014-03-19 LAB — GLUCOSE, CAPILLARY
GLUCOSE-CAPILLARY: 147 mg/dL — AB (ref 70–99)
GLUCOSE-CAPILLARY: 135 mg/dL — AB (ref 70–99)
GLUCOSE-CAPILLARY: 114 mg/dL — AB (ref 70–99)
GLUCOSE-CAPILLARY: 170 mg/dL — AB (ref 70–99)
Glucose-Capillary: 104 mg/dL — ABNORMAL HIGH (ref 70–99)
Glucose-Capillary: 151 mg/dL — ABNORMAL HIGH (ref 70–99)

## 2014-03-19 LAB — TYPE AND SCREEN
ABO/RH(D): A POS
Antibody Screen: NEGATIVE

## 2014-03-19 LAB — URINE CULTURE
CULTURE: NO GROWTH
Colony Count: NO GROWTH

## 2014-03-19 LAB — SURGICAL PCR SCREEN
MRSA, PCR: NEGATIVE
Staphylococcus aureus: POSITIVE — AB

## 2014-03-19 LAB — HEMOGLOBIN AND HEMATOCRIT, BLOOD
HEMATOCRIT: 33.5 % — AB (ref 39.0–52.0)
Hemoglobin: 11 g/dL — ABNORMAL LOW (ref 13.0–17.0)

## 2014-03-19 LAB — ABO/RH: ABO/RH(D): A POS

## 2014-03-19 IMAGING — RF DG ABDOMEN 1V
1 series · 8 of 8 positions shown · non-contrast
Comparison: [DATE]

CLINICAL DATA: Percutaneous nephrolithotomy

EXAM:
ABDOMEN - 1 VIEW; DG C-ARM 1-60 MIN - NRPT [facility]

[Series 1: run · 8 of 8 slices shown]
[im 1/8]
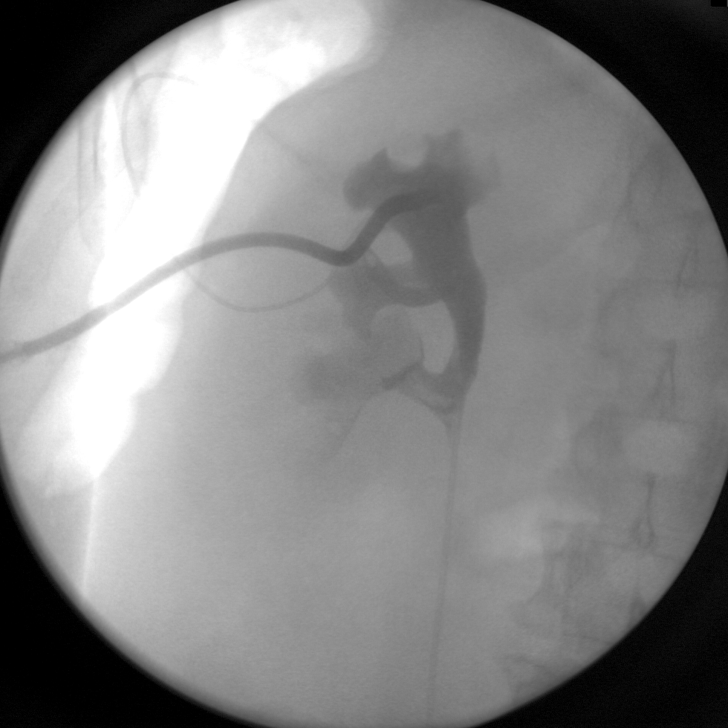
[im 2/8]
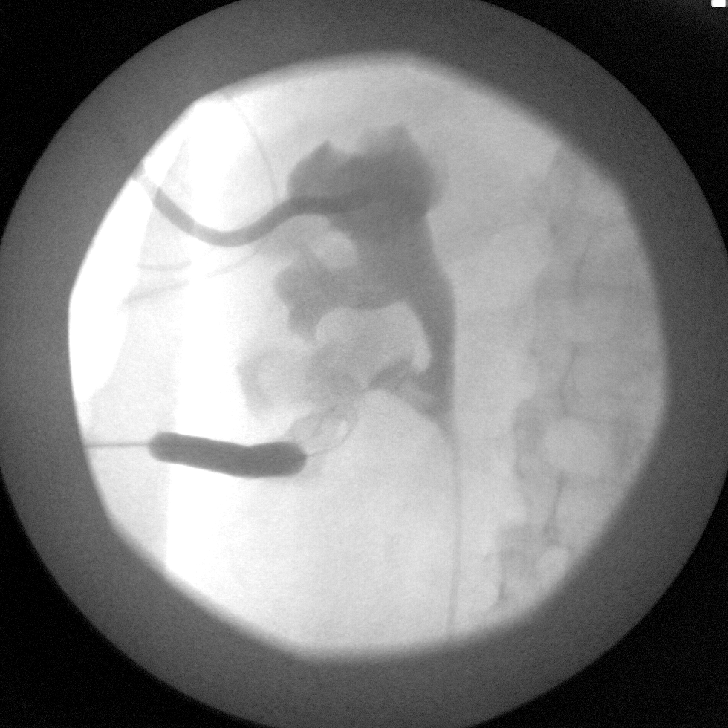
[im 3/8]
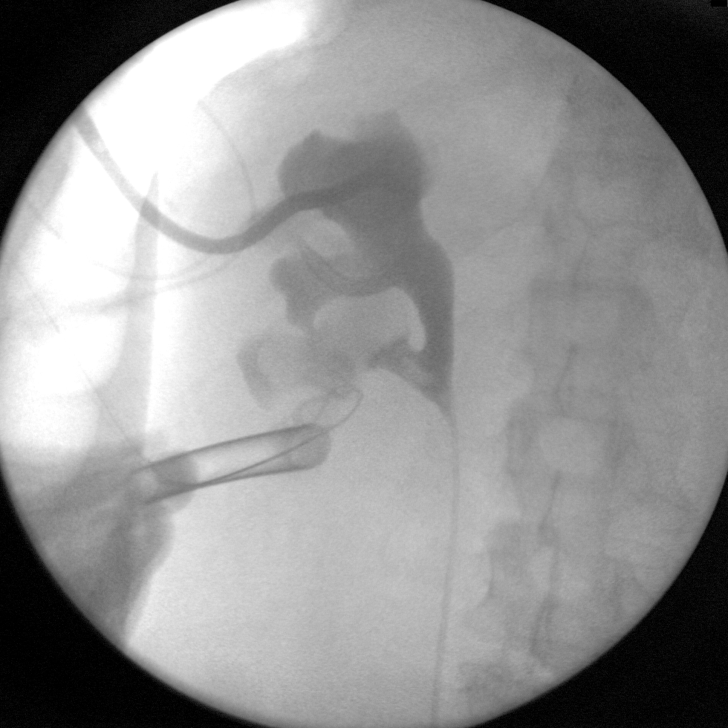
[im 4/8]
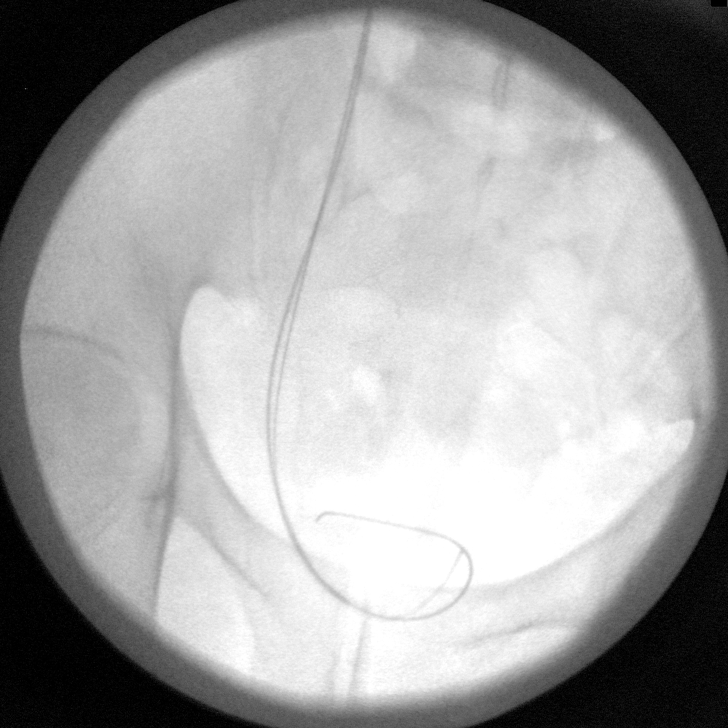
[im 5/8]
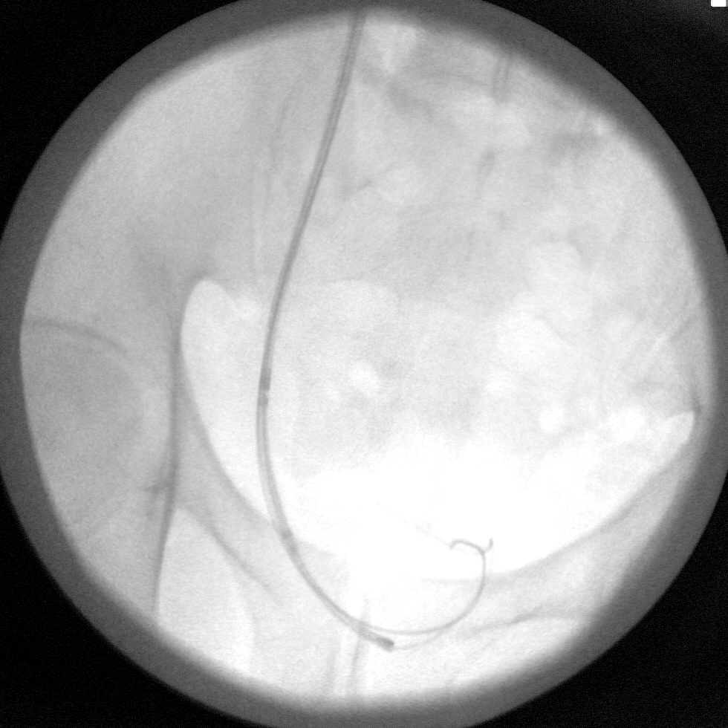
[im 6/8]
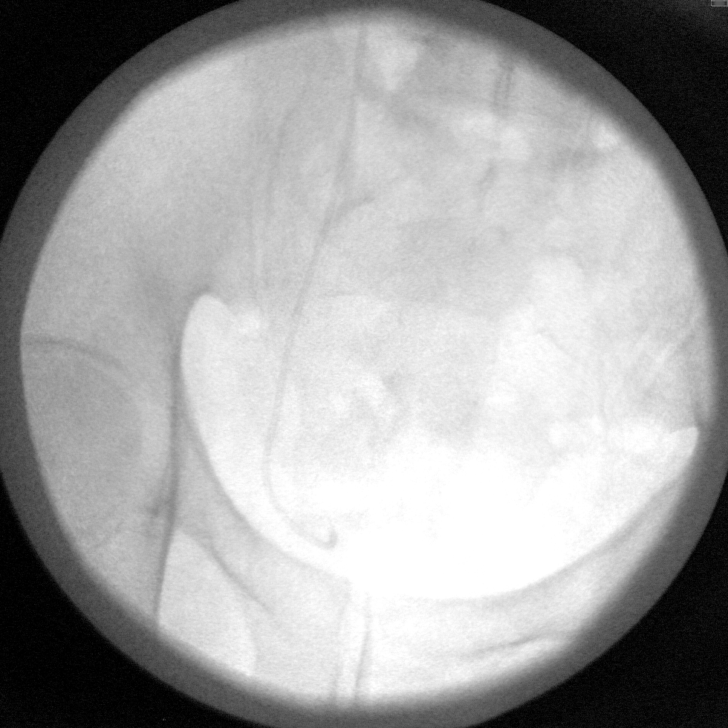
[im 7/8]
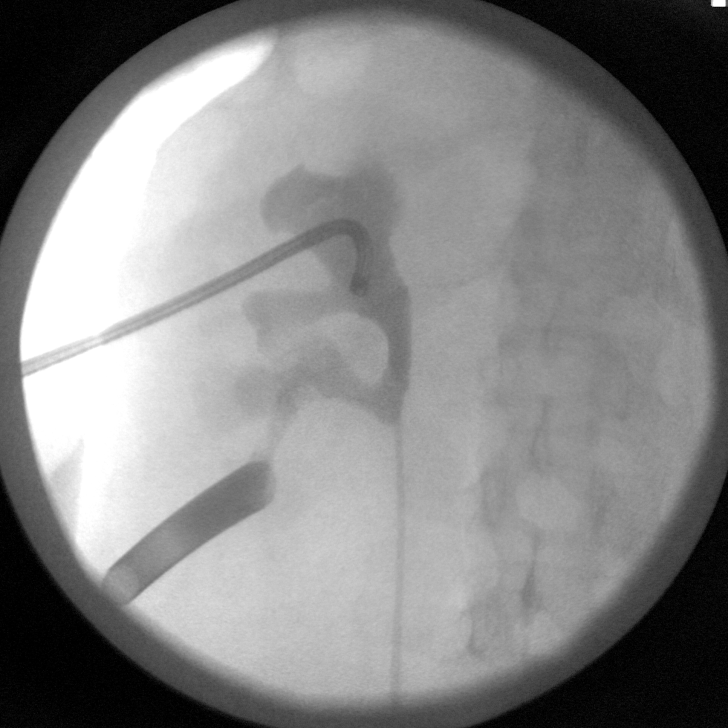
[im 8/8]
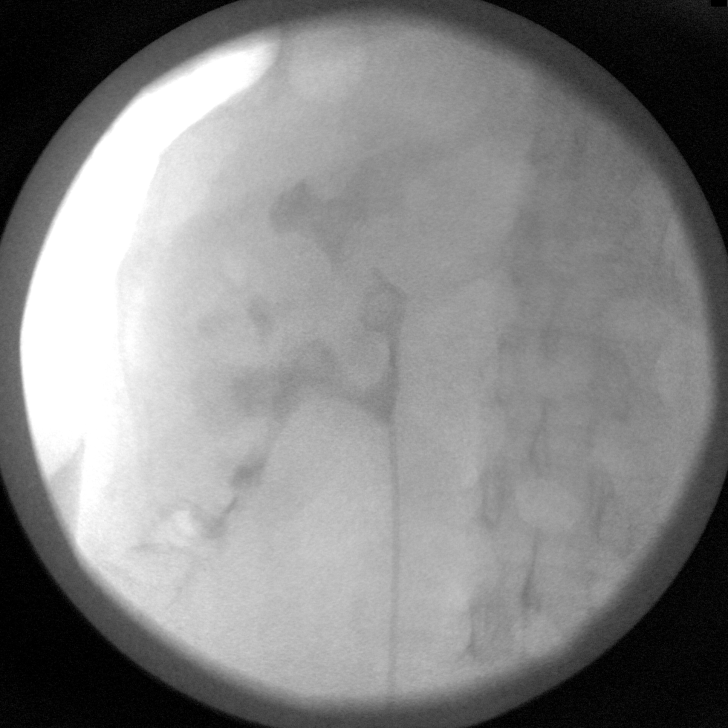

[8 of 8 positions shown; findings below may reference images not displayed]

FINDINGS: Numerous fluoroscopic spot images demonstrate extraction of a large
calculus from the left renal collecting system.
IMPRESSION: Percutaneous nephrolithotomy.

## 2014-03-19 SURGERY — NEPHROLITHOTOMY PERCUTANEOUS
Anesthesia: General | Laterality: Left

## 2014-03-19 MED ORDER — FENTANYL CITRATE 0.05 MG/ML IJ SOLN
INTRAMUSCULAR | Status: DC | PRN
  Administered 2014-03-19 (×4): 50 ug via INTRAVENOUS

## 2014-03-19 MED ORDER — HYDROMORPHONE HCL PF 1 MG/ML IJ SOLN
0.2500 mg | INTRAMUSCULAR | Status: DC | PRN
  Administered 2014-03-19 (×2): 0.5 mg via INTRAVENOUS

## 2014-03-19 MED ORDER — DEXAMETHASONE SODIUM PHOSPHATE 10 MG/ML IJ SOLN
INTRAMUSCULAR | Status: DC | PRN
  Administered 2014-03-19: 10 mg via INTRAVENOUS

## 2014-03-19 MED ORDER — PROPOFOL 10 MG/ML IV BOLUS
INTRAVENOUS | Status: DC | PRN
  Administered 2014-03-19: 180 mg via INTRAVENOUS

## 2014-03-19 MED ORDER — ACETAMINOPHEN 10 MG/ML IV SOLN
1000.0000 mg | Freq: Once | INTRAVENOUS | Status: AC
  Administered 2014-03-19: 1000 mg via INTRAVENOUS
  Filled 2014-03-19: qty 100

## 2014-03-19 MED ORDER — LACTATED RINGERS IV SOLN
INTRAVENOUS | Status: DC | PRN
  Administered 2014-03-19 (×2): via INTRAVENOUS

## 2014-03-19 MED ORDER — HEMOSTATIC AGENTS (NO CHARGE) OPTIME
TOPICAL | Status: DC | PRN
  Administered 2014-03-19: 1 via TOPICAL

## 2014-03-19 MED ORDER — HYDROMORPHONE HCL PF 1 MG/ML IJ SOLN
INTRAMUSCULAR | Status: AC
  Filled 2014-03-19: qty 1

## 2014-03-19 MED ORDER — ONDANSETRON HCL 4 MG/2ML IJ SOLN
INTRAMUSCULAR | Status: DC | PRN
  Administered 2014-03-19 (×2): 4 mg via INTRAVENOUS

## 2014-03-19 MED ORDER — LIDOCAINE HCL (CARDIAC) 20 MG/ML IV SOLN
INTRAVENOUS | Status: AC
  Filled 2014-03-19: qty 5

## 2014-03-19 MED ORDER — FENTANYL CITRATE 0.05 MG/ML IJ SOLN
100.0000 ug | Freq: Once | INTRAMUSCULAR | Status: AC
  Administered 2014-03-19: 100 ug via INTRAVENOUS

## 2014-03-19 MED ORDER — SODIUM CHLORIDE 0.9 % IR SOLN
Status: DC | PRN
  Administered 2014-03-19 (×4): 6000 mL
  Administered 2014-03-19 (×3): 3000 mL
  Administered 2014-03-19: 1500 mL

## 2014-03-19 MED ORDER — IOHEXOL 300 MG/ML  SOLN
INTRAMUSCULAR | Status: DC | PRN
  Administered 2014-03-19: 50 mL

## 2014-03-19 MED ORDER — PROMETHAZINE HCL 25 MG/ML IJ SOLN: 6.2500 mg | INTRAMUSCULAR | Status: DC | PRN

## 2014-03-19 MED ORDER — LACTATED RINGERS IV SOLN
INTRAVENOUS | Status: DC
  Administered 2014-03-19: 1000 mL via INTRAVENOUS

## 2014-03-19 MED ORDER — PROPOFOL 10 MG/ML IV BOLUS
INTRAVENOUS | Status: AC
  Filled 2014-03-19: qty 20

## 2014-03-19 MED ORDER — MIDAZOLAM HCL 5 MG/5ML IJ SOLN
INTRAMUSCULAR | Status: DC | PRN
  Administered 2014-03-19 (×2): 1 mg via INTRAVENOUS

## 2014-03-19 MED ORDER — ROCURONIUM BROMIDE 100 MG/10ML IV SOLN
INTRAVENOUS | Status: DC | PRN
  Administered 2014-03-19: 20 mg via INTRAVENOUS
  Administered 2014-03-19: 10 mg via INTRAVENOUS
  Administered 2014-03-19: 20 mg via INTRAVENOUS

## 2014-03-19 MED ORDER — FENTANYL CITRATE 0.05 MG/ML IJ SOLN
INTRAMUSCULAR | Status: AC
  Filled 2014-03-19: qty 2

## 2014-03-19 MED ORDER — GLYCOPYRROLATE 0.2 MG/ML IJ SOLN
INTRAMUSCULAR | Status: DC | PRN
  Administered 2014-03-19: .4 mg via INTRAVENOUS

## 2014-03-19 MED ORDER — SUCCINYLCHOLINE CHLORIDE 20 MG/ML IJ SOLN
INTRAMUSCULAR | Status: DC | PRN
  Administered 2014-03-19: 100 mg via INTRAVENOUS

## 2014-03-19 MED ORDER — LIDOCAINE HCL (CARDIAC) 20 MG/ML IV SOLN
INTRAVENOUS | Status: DC | PRN
  Administered 2014-03-19: 80 mg via INTRAVENOUS

## 2014-03-19 MED ORDER — MIDAZOLAM HCL 2 MG/2ML IJ SOLN
INTRAMUSCULAR | Status: AC
Start: 2014-03-19 — End: 2014-03-19
  Filled 2014-03-19: qty 2

## 2014-03-19 MED ORDER — NEOSTIGMINE METHYLSULFATE 10 MG/10ML IV SOLN
INTRAVENOUS | Status: DC | PRN
  Administered 2014-03-19: 3 mg via INTRAVENOUS

## 2014-03-19 MED ORDER — HYDROMORPHONE HCL PF 1 MG/ML IJ SOLN
INTRAMUSCULAR | Status: DC | PRN
  Administered 2014-03-19 (×5): .4 mg via INTRAVENOUS

## 2014-03-19 SURGICAL SUPPLY — 62 items
APL SKNCLS STERI-STRIP NONHPOA (GAUZE/BANDAGES/DRESSINGS) ×1
BAG URINE DRAINAGE (UROLOGICAL SUPPLIES) ×4 IMPLANT
BASKET LASER NITINOL 1.9FR (BASKET) ×4 IMPLANT
BASKET ZERO TIP NITINOL 2.4FR (BASKET) ×1 IMPLANT
BENZOIN TINCTURE PRP APPL 2/3 (GAUZE/BANDAGES/DRESSINGS) ×4 IMPLANT
BLADE SURG 15 STRL LF DISP TIS (BLADE) ×1 IMPLANT
BLADE SURG 15 STRL SS (BLADE) ×3
BSKT STON RTRVL 120 1.9FR (BASKET) ×2
BSKT STON RTRVL ZERO TP 2.4FR (BASKET)
CATCHER STONE W/TUBE ADAPTER (UROLOGICAL SUPPLIES) ×1 IMPLANT
CATH BEACON 5.038 65CM KMP-01 (CATHETERS) ×3 IMPLANT
CATH FOLEY 2W COUNCIL 20FR 5CC (CATHETERS) IMPLANT
CATH FOLEY 2WAY SLVR  5CC 16FR (CATHETERS)
CATH FOLEY 2WAY SLVR 5CC 16FR (CATHETERS) ×1 IMPLANT
CATH INTERMIT  6FR 70CM (CATHETERS) ×1 IMPLANT
CATH ROBINSON RED A/P 20FR (CATHETERS) IMPLANT
CATH X-FORCE N30 NEPHROSTOMY (TUBING) ×3 IMPLANT
CHLORAPREP W/TINT 26ML (MISCELLANEOUS) ×4 IMPLANT
COVER SURGICAL LIGHT HANDLE (MISCELLANEOUS) ×3 IMPLANT
DRAPE C-ARM 42X120 X-RAY (DRAPES) ×3 IMPLANT
DRAPE CAMERA CLOSED 9X96 (DRAPES) ×4 IMPLANT
DRAPE LG THREE QUARTER DISP (DRAPES) ×1 IMPLANT
DRAPE LINGEMAN PERC (DRAPES) ×3 IMPLANT
DRAPE SURG IRRIG POUCH 19X23 (DRAPES) ×3 IMPLANT
DRSG PAD ABDOMINAL 8X10 ST (GAUZE/BANDAGES/DRESSINGS) ×2 IMPLANT
DRSG TEGADERM 8X12 (GAUZE/BANDAGES/DRESSINGS) ×4 IMPLANT
FIBER LASER FLEXIVA 365 (UROLOGICAL SUPPLIES) ×2 IMPLANT
GAUZE SPONGE 4X4 12PLY STRL (GAUZE/BANDAGES/DRESSINGS) ×1 IMPLANT
GLOVE BIOGEL M STRL SZ7.5 (GLOVE) ×5 IMPLANT
GOWN STRL REUS W/TWL XL LVL3 (GOWN DISPOSABLE) ×9 IMPLANT
GUIDEWIRE AMPLAZ .035X145 (WIRE) ×6 IMPLANT
GUIDEWIRE ANG ZIPWIRE 038X150 (WIRE) ×4 IMPLANT
GUIDEWIRE STR DUAL SENSOR (WIRE) IMPLANT
IV NS IRRIG 3000ML ARTHROMATIC (IV SOLUTION) ×22 IMPLANT
KIT BASIN OR (CUSTOM PROCEDURE TRAY) ×3 IMPLANT
MANIFOLD NEPTUNE II (INSTRUMENTS) ×3 IMPLANT
NDL TROCAR 18X15 ECHO (NEEDLE) IMPLANT
NDL TROCAR 18X20 (NEEDLE) IMPLANT
NEEDLE TROCAR 18X15 ECHO (NEEDLE) ×3 IMPLANT
NEEDLE TROCAR 18X20 (NEEDLE) IMPLANT
NS IRRIG 1000ML POUR BTL (IV SOLUTION) ×1 IMPLANT
PACK BASIC VI WITH GOWN DISP (CUSTOM PROCEDURE TRAY) ×3 IMPLANT
PACK CYSTO (CUSTOM PROCEDURE TRAY) ×1 IMPLANT
PAD ABD 8X10 STRL (GAUZE/BANDAGES/DRESSINGS) ×2 IMPLANT
PROBE LITHOCLAST ULTRA 3.8X403 (UROLOGICAL SUPPLIES) ×2 IMPLANT
PROBE PNEUMATIC 1.0MMX570MM (UROLOGICAL SUPPLIES) ×3 IMPLANT
SET IRRIG Y TYPE TUR BLADDER L (SET/KITS/TRAYS/PACK) ×3 IMPLANT
SET WARMING FLUID IRRIGATION (MISCELLANEOUS) IMPLANT
SHEATH PEELAWAY SET 9 (SHEATH) ×2 IMPLANT
SPONGE LAP 4X18 X RAY DECT (DISPOSABLE) ×3 IMPLANT
STENT CONTOUR 6FRX26X.038 (STENTS) ×2 IMPLANT
STONE CATCHER W/TUBE ADAPTER (UROLOGICAL SUPPLIES) ×3 IMPLANT
SUT SILK 2 0 30  PSL (SUTURE) ×2
SUT SILK 2 0 30 PSL (SUTURE) ×1 IMPLANT
SUT VIC AB 3-0 SH 27 (SUTURE) ×6
SUT VIC AB 3-0 SH 27X BRD (SUTURE) IMPLANT
SYRINGE 12CC LL (MISCELLANEOUS) ×3 IMPLANT
SYRINGE 20CC LL (MISCELLANEOUS) ×4 IMPLANT
TOWEL OR 17X26 10 PK STRL BLUE (TOWEL DISPOSABLE) ×3 IMPLANT
TUBING CONNECTING 10 (TUBING) ×5 IMPLANT
TUBING CONNECTING 10' (TUBING) ×2
WATER STERILE IRR 1500ML POUR (IV SOLUTION) ×1 IMPLANT

## 2014-03-19 NOTE — Progress Notes (Signed)
2 Days Post-Op  Subjective:  1 - Lt > Rt Large Volume Nephrolithiasis - s/p cysto + right stent pull + left first stage percutaneous nephrostolithotomy 03/17/14 partial staghorn left stone via upper pole access.   2 - Creatinine Rise - Cr baseline < 1.5. Noted on AM labs 6/23 rise to 2.22. UOP acceptable. Received extended interval gentamicin 6/22 as part of peri-op prophylaxis. Cr now trending back to baseline.  Today Abbe Amsterdam is without complaints. No tube problems, ready for second stage surgery later today.   Objective: Vital signs in last 24 hours: Temp:  [98.2 F (36.8 C)-99.1 F (37.3 C)] 98.2 F (36.8 C) (06/24 0500) Pulse Rate:  [80-102] 80 (06/24 0500) Resp:  [16-18] 18 (06/24 0500) BP: (115-128)/(64-72) 126/72 mmHg (06/24 0500) SpO2:  [94 %-96 %] 94 % (06/24 0500) Last BM Date: 03/17/14  Intake/Output from previous day: 06/23 0701 - 06/24 0700 In: 2186.7 [P.O.:720; I.V.:1466.7] Out: 3350 [Urine:3350] Intake/Output this shift: Total I/O In: 1706.7 [P.O.:240; I.V.:1466.7] Out: 2650 [Urine:2650]  General appearance: alert, cooperative and appears stated age Head: Normocephalic, without obvious abnormality, atraumatic Nose: Nares normal. Septum midline. Mucosa normal. No drainage or sinus tenderness. Throat: lips, mucosa, and tongue normal; teeth and gums normal Neck: supple, symmetrical, trachea midline Back: symmetric, no curvature. ROM normal. No CVA tenderness. Resp: non-labored Chest wall: no tenderness Cardio: Nl rate GI: soft, non-tender; bowel sounds normal; no masses,  no organomegaly Male genitalia: normal, foley c/d/i with yellow urine Extremities: extremities normal, atraumatic, no cyanosis or edema Pulses: 2+ and symmetric Skin: Skin color, texture, turgor normal. No rashes or lesions Lymph nodes: Cervical, supraclavicular, and axillary nodes normal. Neurologic: Grossly normal Incision/Wound: Neph tube c/d/i with very light pink urine. No flank ecchymoses.    Lab Results:   Recent Labs  03/17/14 1629 03/18/14 0349  HGB 10.7* 10.6*  HCT 31.5* 32.2*   BMET  Recent Labs  03/18/14 0349 03/19/14 0442  NA 138 137  K 4.6 5.1  CL 101 102  CO2 25 22  GLUCOSE 122* 164*  BUN 23 21  CREATININE 2.22* 1.89*  CALCIUM 8.7 8.8   PT/INR No results found for this basename: LABPROT, INR,  in the last 72 hours ABG No results found for this basename: PHART, PCO2, PO2, HCO3,  in the last 72 hours  Studies/Results: Dg Abd 1 View  03/17/2014   CLINICAL DATA:  Left kidney stone removal.  EXAM: ABDOMEN - 1 VIEW  Fluoroscopic time: 20 min.  COMPARISON:  Three intraoperative C-arm views performed earlier in the day 03/17/2014. 02/01/2014 CT.  FINDINGS: Seven intraoperative C-arm views submitted for review after surgery.  Films are not marked with regard to left or right side. On the prior CT scan, left staghorn calculus is noted. History also states left-sided stone removal.  A left ureteral stent is in place. Multiple large renal calculi with hydronephrosis.  Nephrostomy tract balloon dilatation follow by placement rigid and subsequently flexible scope. Some the stones appear fragmented/removed. Percutaneous nephrostomy tube placing the upper pole. Not all the stones have been removed on the last contrast filled imaging of the left proximal collecting system.  IMPRESSION: Placement of left percutaneous nephrostomy tube after stone fragmentation/partial removal.   Electronically Signed   By: Chauncey Cruel M.D.   On: 03/17/2014 17:17   Dg Chest Port 1 View  03/17/2014   CLINICAL DATA:  Post left percutaneous nephrostomy placement. Rule out pneumothorax.  EXAM: PORTABLE CHEST - 1 VIEW  COMPARISON:  08/30/2011 chest x-ray.  FINDINGS: No evidence gross pneumothorax.  Left base subsegmental atelectasis.  Cardiomegaly.  Pulmonary vascular prominence most notable centrally.  IMPRESSION: No evidence gross pneumothorax.  Left base subsegmental atelectasis.  Cardiomegaly.   Pulmonary vascular prominence.   Electronically Signed   By: Chauncey Cruel M.D.   On: 03/17/2014 17:04   Dg Retrograde-urethrogram  03/17/2014   CLINICAL DATA:  Bilateral hydronephrosis.  EXAM: INTRAOPERATIVE LEFT RETROGRADE UROGRAPHY, DG C-ARM 61-120 MIN  TECHNIQUE: Images were obtained with the C-arm fluoroscopic device intraoperatively and submitted for interpretation post-operatively. Please see the procedural report for the amount of contrast and the fluoroscopy time utilized.  COMPARISON:  CT SCAN DATED 02/01/2014  FINDINGS: Three C-arm images demonstrate the left ureteral catheter in place. Injection in the dilated pelvicaliceal system demonstrates extensive filling defects consistent with a large staghorn calculus.  IMPRESSION: Large staghorn calculus in the left kidney.   Electronically Signed   By: Rozetta Nunnery M.D.   On: 03/17/2014 13:40   Dg C-arm 61-120 Min-no Report  03/17/2014   CLINICAL DATA:  Bilateral hydronephrosis.  EXAM: INTRAOPERATIVE LEFT RETROGRADE UROGRAPHY, DG C-ARM 61-120 MIN  TECHNIQUE: Images were obtained with the C-arm fluoroscopic device intraoperatively and submitted for interpretation post-operatively. Please see the procedural report for the amount of contrast and the fluoroscopy time utilized.  COMPARISON:  CT SCAN DATED 02/01/2014  FINDINGS: Three C-arm images demonstrate the left ureteral catheter in place. Injection in the dilated pelvicaliceal system demonstrates extensive filling defects consistent with a large staghorn calculus.  IMPRESSION: Large staghorn calculus in the left kidney.   Electronically Signed   By: Rozetta Nunnery M.D.   On: 03/17/2014 13:40    Anti-infectives: Anti-infectives   Start     Dose/Rate Route Frequency Ordered Stop   03/19/14 1000  cefTRIAXone (ROCEPHIN) 1 g in dextrose 5 % 50 mL IVPB     1 g 100 mL/hr over 30 Minutes Intravenous Every 24 hours 03/18/14 1458     03/17/14 0600  gentamicin (GARAMYCIN) 440 mg in dextrose 5 % 100 mL IVPB      440 mg 111 mL/hr over 60 Minutes Intravenous 60 min pre-op 03/16/14 1445 03/17/14 1145      Assessment/Plan:  1 - Lt > Rt Large Volume Nephrolithiasis - doing well s/p 1st stage surgery. Explained will take at least 1 more stage, preferably with new lower pole access if possible and will proceed as planned today. Risks and benefits outlined again.   2 - Creatinine Rise - DDX aminoglycoside effect v. Some systemic absorption from mild extrav along tract. Improving with med change and conservative measures. No further w/u at this point.   [facility], [place] 03/19/2014

## 2014-03-19 NOTE — Anesthesia Postprocedure Evaluation (Signed)
  Anesthesia Post-op Note  Patient: [name]  Procedure(s) Performed: Procedure(s) (LRB): 2ND STAGE NEPHROLITHOTOMY PERCUTANEOUS left ureteroscopy with lasr stone treatment and stent placement, with access (Left) HOLMIUM LASER APPLICATION (Left)  Patient Location: PACU  Anesthesia Type: General  Level of Consciousness: awake and alert   Airway and Oxygen Therapy: Patient Spontanous Breathing  Post-op Pain: mild  Post-op Assessment: Post-op Vital signs reviewed, Patient's Cardiovascular Status Stable, Respiratory Function Stable, Patent Airway and No signs of Nausea or vomiting  Last Vitals:  Filed Vitals:   03/19/14 1915  BP: 152/63  Pulse:   Temp:   Resp:     Post-op Vital Signs: stable   Complications: No apparent anesthesia complications

## 2014-03-19 NOTE — Brief Op Note (Signed)
03/17/2014 - 03/19/2014  6:11 PM  PATIENT:  [name]  58 y.o. male  PRE-OPERATIVE DIAGNOSIS:  LEFT STAGHORN KIDNEY STONE, BILATERAL RETAINED STENTS  POST-OPERATIVE DIAGNOSIS:  LEFT STAGHORN KIDNEY STONE, BILATERAL RETAINED STENTS  PROCEDURE:  Procedure(s): 2ND STAGE NEPHROLITHOTOMY PERCUTANEOUS left ureteroscopy with lasr stone treatment and stent placement, with access (Left) HOLMIUM LASER APPLICATION (Left)  SURGEON:  Surgeon(s) and Role:    * Alexis Frock, MD - Primary  PHYSICIAN ASSISTANT:   ASSISTANTS: none   ANESTHESIA:   general  EBL:  Total I/O In: 1000 [I.V.:1000] Out: 1900 [Urine:1900]  BLOOD ADMINISTERED:none  DRAINS: none   LOCAL MEDICATIONS USED:  NONE  SPECIMEN:  Source of Specimen:  Left Renal Stone Fragments  DISPOSITION OF SPECIMEN:  [facility] for compositional analysis  COUNTS:  YES  TOURNIQUET:  * No tourniquets in log *  DICTATION: .Other Dictation: Dictation Number [id number]  PLAN OF CARE: Admit to inpatient   PATIENT DISPOSITION:  PACU - hemodynamically stable.   Delay start of Pharmacological VTE agent (>24hrs) due to surgical blood loss or risk of bleeding: not applicable

## 2014-03-19 NOTE — Anesthesia Preprocedure Evaluation (Signed)
Anesthesia Evaluation  Patient identified by MRN, date of birth, ID band Patient awake    Reviewed: Allergy & Precautions, H&P , NPO status , Patient's Chart, lab work & pertinent test results  Airway Mallampati: II TM Distance: >3 FB Neck ROM: Full    Dental no notable dental hx.    Pulmonary neg pulmonary ROS,  breath sounds clear to auscultation  Pulmonary exam normal       Cardiovascular hypertension, Pt. on medications Rhythm:Regular Rate:Normal     Neuro/Psych negative neurological ROS  negative psych ROS   GI/Hepatic negative GI ROS, Neg liver ROS,   Endo/Other  diabetes  Renal/GU negative Renal ROS  negative genitourinary   Musculoskeletal negative musculoskeletal ROS (+)   Abdominal   Peds negative pediatric ROS (+)  Hematology  (+) anemia ,   Anesthesia Other Findings   Reproductive/Obstetrics negative OB ROS                           Anesthesia Physical Anesthesia Plan  ASA: III  Anesthesia Plan: General   Post-op Pain Management:    Induction: Intravenous  Airway Management Planned: Oral ETT  Additional Equipment:   Intra-op Plan:   Post-operative Plan: Extubation in OR  Informed Consent: I have reviewed the patients History and Physical, chart, labs and discussed the procedure including the risks, benefits and alternatives for the proposed anesthesia with the patient or authorized representative who has indicated his/her understanding and acceptance.   Dental advisory given  Plan Discussed with: CRNA and Surgeon  Anesthesia Plan Comments:         Anesthesia Quick Evaluation

## 2014-03-19 NOTE — Clinical Documentation Improvement (Signed)
Please clarify renal status. Thank you.  Possible Clinical Conditions?   Acute Renal Failure/Acute Kidney Injury Acute Tubular Necrosis Acute on Chronic Renal Failure Chronic Renal Failure Other Condition Cannot Clinically Determine   Supporting Information: Risk Factors: 6/23:Nephrolithiasis-s/p cysto + right stent pull + left first stage percutaneous nephrostolithotomy 03/17/14 partial staghorn left stone via upper pole access.  6/23: Creatinine Rise   Diagnostics:  BUN/CR/GFR 6/17: 38/1.10/73 6/23: 23/2.22/31 6/24: 21/1.89/38  Treatments: Strict I&O Monitoring labs IV NS@100ml /hr  Thank You, Estella Husk ,RN Clinical Documentation Specialist:  [place] Information Management

## 2014-03-19 NOTE — Transfer of Care (Signed)
Immediate Anesthesia Transfer of Care Note  Patient: [name]  Procedure(s) Performed: Procedure(s): 2ND STAGE NEPHROLITHOTOMY PERCUTANEOUS left ureteroscopy with lasr stone treatment and stent placement, with access (Left) HOLMIUM LASER APPLICATION (Left)  Patient Location: PACU  Anesthesia Type:General  Level of Consciousness: awake, alert  and oriented  Airway & Oxygen Therapy: Patient Spontanous Breathing and Patient connected to face mask oxygen  Post-op Assessment: Report given to PACU RN and Post -op Vital signs reviewed and stable  Post vital signs: Reviewed and stable  Complications: No apparent anesthesia complications

## 2014-03-20 ENCOUNTER — Other Ambulatory Visit: Payer: Self-pay

## 2014-03-20 ENCOUNTER — Inpatient Hospital Stay (HOSPITAL_COMMUNITY): Payer: BC Managed Care – PPO

## 2014-03-20 ENCOUNTER — Encounter (HOSPITAL_COMMUNITY): Payer: Self-pay | Admitting: Urology

## 2014-03-20 LAB — BASIC METABOLIC PANEL
BUN: 22 mg/dL (ref 6–23)
CALCIUM: 8.9 mg/dL (ref 8.4–10.5)
CO2: 23 mEq/L (ref 19–32)
CREATININE: 1.31 mg/dL (ref 0.50–1.35)
Chloride: 101 mEq/L (ref 96–112)
GFR calc non Af Amer: 59 mL/min — ABNORMAL LOW (ref >90)
GFR, EST AFRICAN AMERICAN: 68 mL/min — AB (ref >90)
Glucose, Bld: 208 mg/dL — ABNORMAL HIGH (ref 70–99)
Potassium: 5.1 mEq/L (ref 3.7–5.3)
Sodium: 137 mEq/L (ref 137–147)

## 2014-03-20 LAB — GLUCOSE, CAPILLARY
GLUCOSE-CAPILLARY: 207 mg/dL — AB (ref 70–99)
Glucose-Capillary: 166 mg/dL — ABNORMAL HIGH (ref 70–99)
Glucose-Capillary: 180 mg/dL — ABNORMAL HIGH (ref 70–99)
Glucose-Capillary: 199 mg/dL — ABNORMAL HIGH (ref 70–99)

## 2014-03-20 LAB — HEMOGLOBIN AND HEMATOCRIT, BLOOD
HCT: 30.7 % — ABNORMAL LOW (ref 39.0–52.0)
HEMOGLOBIN: 10.3 g/dL — AB (ref 13.0–17.0)

## 2014-03-20 IMAGING — DX DG ABDOMEN 1V
1 series · 1 of 1 positions shown · non-contrast
Comparison: Fluoroscopic radiographs [DATE]

CLINICAL DATA: New left chest and flank pain, verify stent
positioning. Status post percutaneous left nephrolithotomy.

EXAM:
ABDOMEN - 1 VIEW

[abdomen]
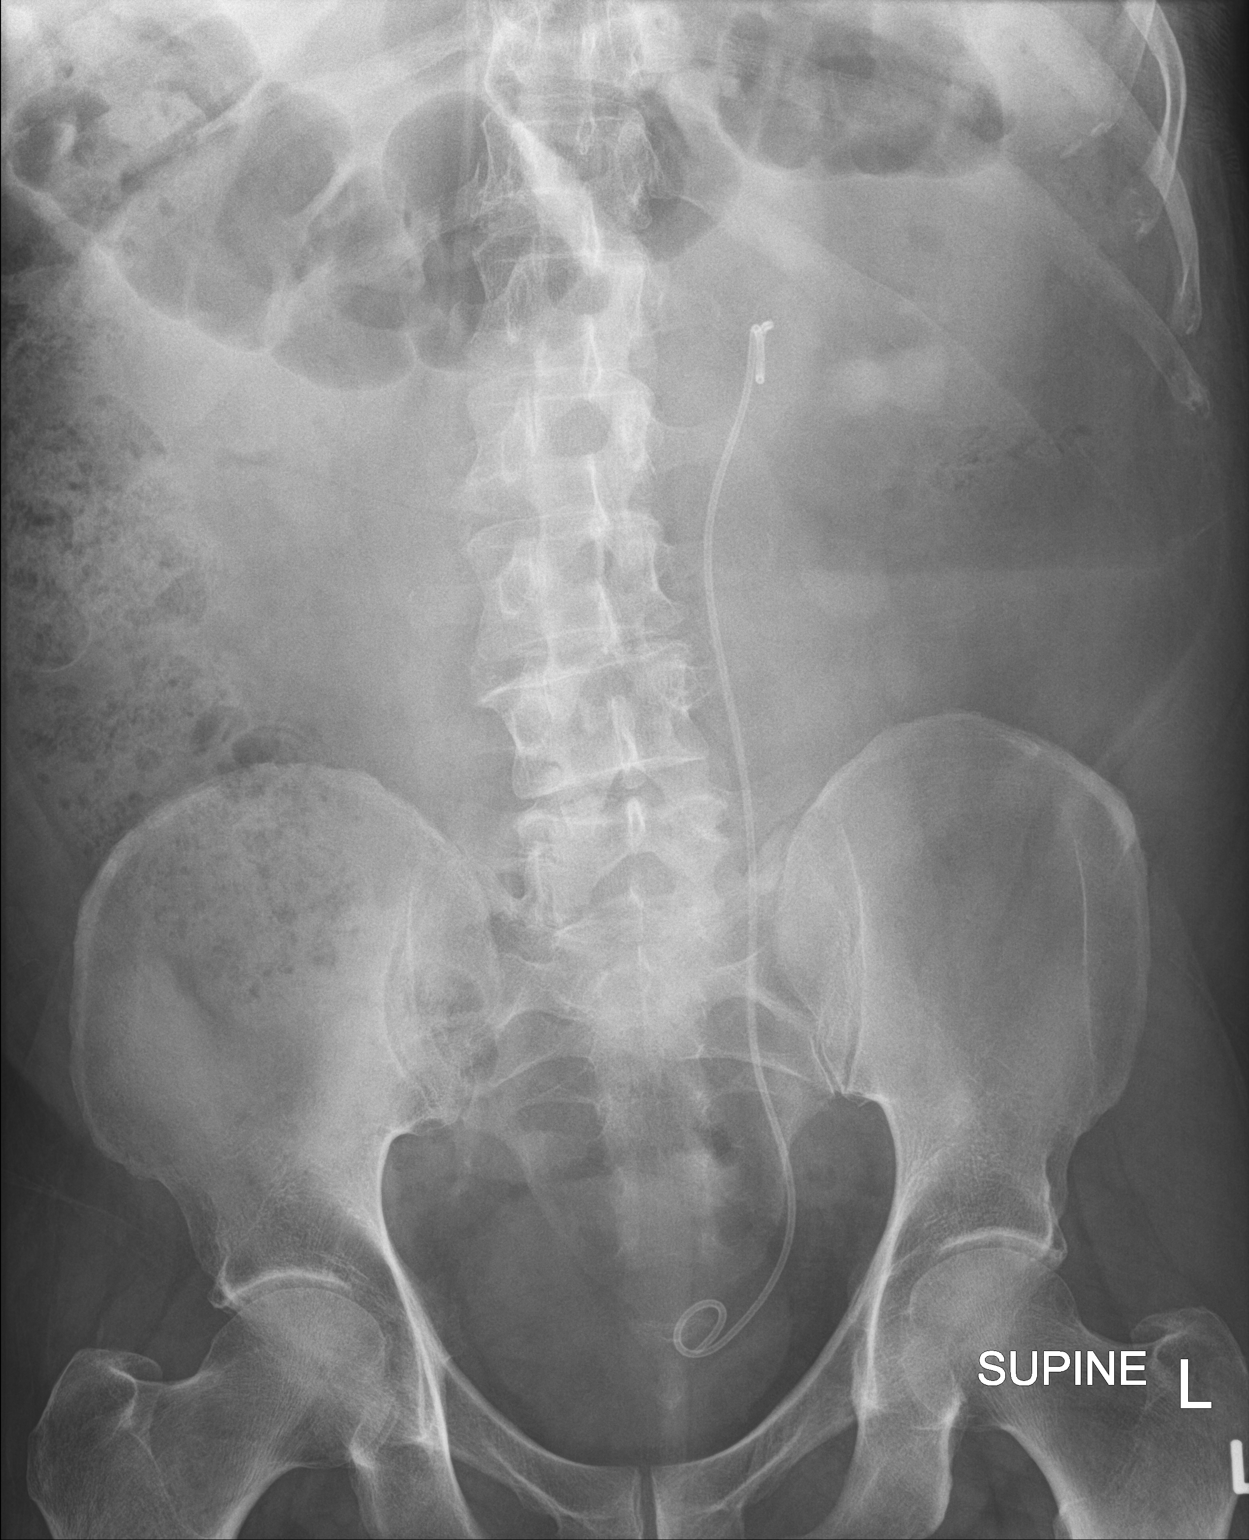

[1 of 1 positions shown; findings below may reference images not displayed]

FINDINGS: Left double-pigtail nephro ureteral stent with proximal retaining
lobe projecting in the left kidney, distal retaining loop projecting
in the pelvis. The catheter appears intact without migration.
Suspected residual left nephrolithiasis.

Overall paucity of bowel gas, with mild amount of retained large
bowel stool. No intra-abdominal mass effect. Dextroscoliosis.
IMPRESSION: Left double pigtail intact nephro ureteral stents without
radiographic findings of migration.

  By: NGOC

## 2014-03-20 MED ORDER — ZOLPIDEM TARTRATE 5 MG PO TABS
5.0000 mg | ORAL_TABLET | Freq: Every evening | ORAL | Status: DC | PRN
  Administered 2014-03-20: 5 mg via ORAL
  Filled 2014-03-20: qty 1

## 2014-03-20 NOTE — Op Note (Signed)
NAMEMANSUR, Rodney NO.:  [id number]  MEDICAL RECORD NO.:  [id number]  LOCATION:  5462                         FACILITY:  [facility]  PHYSICIAN:  Alexis Frock, MD     DATE OF BIRTH:  1955/10/27  DATE OF PROCEDURE: 03/19/2014 DATE OF DISCHARGE:                              OPERATIVE REPORT   DIAGNOSIS:  Residual large volume left partial staghorn kidney stone.  PROCEDURES: 1. Left antegrade nephrostogram interpretation. 2. Left second stage percutaneous nephrostolithotomy stone, greater     than 2 cm. 3. Left ureteroscopy with laser lithotripsy. 4. Left ureteral stent exchange. 5. Percutaneous access to the left kidney with dilation of tract x1.  ESTIMATED BLOOD LOSS:  Nil.  COMPLICATIONS:  None.  SPECIMEN:  Left renal stone fragments for compositional analysis.  FINDINGS: 1. Excellent clearance of upper pole stones on initial antegrade     nephrostogram.  No evidence of extravasation, large volume lower     pole residual stone. 2. Successful access to the lower pole calyx with removal     approximately 90% of residual stone. 3. Some residual stone and acutely angled calyx to the access tracts,     this was approximately mid pole.  It was felt that this area did not post near-term risk of renal pelvis or main infundibular obstruction. 4. Several small stone fragments within the ureter, threfore laser     lithotripsy was performed. 5. Unremarkable final left antegrade nephrostogram.  DRAINS:  Foley catheter straight drain.  INDICATION:  Mr. Bannan is a pleasant 58 year old gentleman with long history of nephrolithiasis.  He was found to have a right ureteral and left partial staghorn kidney stone last month.  He underwent right ureteroscopic stone manipulation at that time.  He underwent subsequently a left first stage percutaneous nephrostolithotomy on March 17, 2014, and presents today for planned second-stage procedure given the huge volume of stone.   Informed consent was obtained and placed in medical record.  PROCEDURE IN DETAIL:  The patient being [name] verified, procedure being left second stage percutaneous nephrostolithotomy was confirmed. Procedure was carried out.  Time-out was performed.  Intravenous antibiotics were verified.  General endotracheal anesthesia was introduced.  The patient was then placed into a completely prone position, employing chest rolls, axillary roll, sequential compression devices, padding of his knees and ankles, prone view.  Sterile field was created by prepping and draping the patient's entire left flank including his previous nephroureteral stent, left nephrostomy tube, and the operative field, and left antegrade nephrostogram was obtained.  Left antegrade nephrostogram initially revealed no evidence of extravasation along the tract.  Excellent clearance of upper pole and renal pelvis stone, filling defects in the lower pole and mid aspects, consistent with known residual stone.  Then, our attention was directed at re-attempt of left lower pole percutaneous access using 18-gauge Chiba needle at 15 degrees off center in [place] eye technique.  The lower pole calyx in question was identified.  Needle access was obtained as verified by efflux of copious clear urine and the superstiff wire was coiled into this calyx, over which the 30-French NephroMax balloon dilation apparatus was carefully advanced across the calyx, inflated  to a pressure of 16 atmospheres, held for 90 seconds, and the sheath was advanced and rigid nephroscopy was performed.  Rigid nephroscopy revealed excellent sheath placement in direct apposition of lower pole stone.  Next, LithoClast dual ultrasound pneumatic energy was applied to this lower pole stone as well as large infundibular component for approximately 3 hours, completely removing it.  Following these maneuvers, using flexible nephroscopy via this tract, the inferior  renal pelvis was clearly identified and unobstructed path to this and ZIP safety wire was advanced down the ureter.  Using flexible nephroscopic technique, an upper mid pole calyx was identified to have large volume residual stone and holmium laser energy was applied to this area using settings of 0.8 joules and 10 Hz, fragmenting the stone into pieces, approximately 8 mm or less which were then grasped with Escape basket, brought out in their entirety, set aside.  The flexible nephroscopy was then performed via the upper pole prior tract after removal of the nephrostomy tube as well as lower pole, and all residual accessible stone, larger than 1/3rd mm had been successfully removed.  On fluoroscopy, there was radiodensity consistent with likely small volume residual stone in the mid pole calyx, however, due to angulation, this was not accessible.  It was felt that this would be best left in situ as this calyx is known to be anterior and additional percutaneous tract felt to be hazardous to this location, and we completed the goals of surgeries including removal of majority of stone and reestablishing drainage of the kidney, especially from the renal pelvis and the dominant infundibula.  The previous nephroureteral stent was exchanged for a superstiff wire, over which the 6-French distal ureteroscope was advanced at the level of the urinary bladder and carefully retracted to verify clearance of ureteral stone.  There were too many stones that required holmium laser lithotripsy and was it performed again using settings of 0.8 joules and 10 Hz.  The fragments were grasped with Escape basket and brought out in their entirety.  Final antegrade ureteroscopy revealed completely patent ureter from the UPJ to the bladder.  Complete resolution of all stone fragments larger than 1/3rd mm.  Next, a 6 x 26 double-J stent was placed in antegrade fashion over the remaining upper pole ZIPwire using  nephroscopic and fluoroscopic guidance.  Good proximal and distal curl were noted.  Final antegrade nephrostogram was then performed via the flexible nephroscope.  Final antegrade nephrostogram revealed no evidence of renal perforation, excellent placement of the stent, and again some likely residual stone in the mid pole, acutely angled calyx to the access tract as discussed above.  The lower pole and upper pole access tracts were then removed. A 5 mL Surgiflo was injected in each one of these and these were closed to the level of skin using mattress 3-0 Vicryl followed by percutaneous dressing.  Procedure was then terminated.  The patient tolerated the procedure well and there were no immediate periprocedural complications. The patient was taken to the postanesthesia care unit in stable condition.          ______________________________ Alexis Frock, MD     TM/MEDQ  D:  03/19/2014  T:  03/20/2014  Job:  [id number]

## 2014-03-20 NOTE — Progress Notes (Signed)
1 Day Post-Op  Subjective:  1 - Lt > Rt Large Volume Nephrolithiasis - s/p cysto + right stent pull + left first stage percutaneous nephrostolithotomy 03/17/14 partial staghorn left stone via upper pole access. Second stage left PCNL via lower pole access 6/24, estimated removed 90-95% total stone volume.   2 - Creatinine Rise - Cr baseline < 1.5. Noted on AM labs 6/23 rise to 2.22. UOP acceptable. Received extended interval gentamicin 6/22 as part of peri-op prophylaxis. Cr now trending back to baseline.   Today Abbe Amsterdam is doing well. Pain controlled though still req meds, voiding with foley out. Had one low grade temp immediately post-op yesterday that has subsided.   Objective: Vital signs in last 24 hours: Temp:  [97.7 F (36.5 C)-100.4 F (38 C)] 98.3 F (36.8 C) (06/25 0440) Pulse Rate:  [82-106] 91 (06/25 0440) Resp:  [18] 18 (06/24 2007) BP: (129-152)/(63-77) 129/75 mmHg (06/25 0440) SpO2:  [91 %-96 %] 96 % (06/25 0440) Last BM Date: 03/20/14  Intake/Output from previous day: 06/24 0701 - 06/25 0700 In: 6140 [P.O.:720; I.V.:4500] Out: 2790 [Urine:2790] Intake/Output this shift: Total I/O In: -  Out: 220 [Urine:220]  General appearance: alert, cooperative and appears stated age Head: Normocephalic, without obvious abnormality, atraumatic Nose: Nares normal. Septum midline. Mucosa normal. No drainage or sinus tenderness. Throat: lips, mucosa, and tongue normal; teeth and gums normal Neck: supple, symmetrical, trachea midline Back: symmetric, no curvature. ROM normal. No CVA tenderness. Resp: non-labored on room air Chest wall: no tenderness Male genitalia: normal Extremities: extremities normal, atraumatic, no cyanosis or edema Pulses: 2+ and symmetric Skin: Skin color, texture, turgor normal. No rashes or lesions Lymph nodes: Cervical, supraclavicular, and axillary nodes normal. Neurologic: Grossly normal Incision/Wound: Left flank site c/d/i with dressing in place. No  ecchymoses / hematoma.   Lab Results:   Recent Labs  03/19/14 1840 03/20/14 0424  HGB 11.0* 10.3*  HCT 33.5* 30.7*   BMET  Recent Labs  03/19/14 0442 03/20/14 0424  NA 137 137  K 5.1 5.1  CL 102 101  CO2 22 23  GLUCOSE 164* 208*  BUN 21 22  CREATININE 1.89* 1.31  CALCIUM 8.8 8.9   PT/INR No results found for this basename: LABPROT, INR,  in the last 72 hours ABG No results found for this basename: PHART, PCO2, PO2, HCO3,  in the last 72 hours  Studies/Results: Dg Abd 1 View  03/19/2014   CLINICAL DATA:  Percutaneous nephrolithotomy  EXAM: ABDOMEN - 1 VIEW; DG C-ARM 1-60 MIN - NRPT [facility]  COMPARISON:  03/17/2014  FINDINGS: Numerous fluoroscopic spot images demonstrate extraction of a large calculus from the left renal collecting system.  IMPRESSION: Percutaneous nephrolithotomy.   Electronically Signed   By: Kalman Jewels M.D.   On: 03/19/2014 18:27   Dg C-arm 1-60 Min-no Report  03/19/2014   CLINICAL DATA:  Percutaneous nephrolithotomy  EXAM: ABDOMEN - 1 VIEW; DG C-ARM 1-60 MIN - NRPT [facility]  COMPARISON:  03/17/2014  FINDINGS: Numerous fluoroscopic spot images demonstrate extraction of a large calculus from the left renal collecting system.  IMPRESSION: Percutaneous nephrolithotomy.   Electronically Signed   By: Kalman Jewels M.D.   On: 03/19/2014 18:27    Anti-infectives: Anti-infectives   Start     Dose/Rate Route Frequency Ordered Stop   03/19/14 1000  cefTRIAXone (ROCEPHIN) 1 g in dextrose 5 % 50 mL IVPB     1 g 100 mL/hr over 30 Minutes Intravenous Every 24 hours 03/18/14 1458  03/17/14 0600  gentamicin (GARAMYCIN) 440 mg in dextrose 5 % 100 mL IVPB     440 mg 111 mL/hr over 60 Minutes Intravenous 60 min pre-op 03/16/14 1445 03/17/14 1145      Assessment/Plan:  1 - Lt > Rt Large Volume Nephrolithiasis - doing well s/p 2 stage surgery for large left stone. Now with JJ stent and voiding on his own. Continue.   2 - Creatinine Rise - Cr now back to baseline  with conservative measures.  3 - Disposition - Plan for DC tomorrow. One more day for continued pain control and verify remains afebrile would be best.   [facility], [place] 03/20/2014

## 2014-03-21 MED ORDER — SULFAMETHOXAZOLE-TMP DS 800-160 MG PO TABS: 1.0000 | ORAL_TABLET | Freq: Two times a day (BID) | ORAL | Status: DC

## 2014-03-21 MED ORDER — SENNOSIDES-DOCUSATE SODIUM 8.6-50 MG PO TABS: 1.0000 | ORAL_TABLET | Freq: Two times a day (BID) | ORAL | Status: DC

## 2014-03-21 MED ORDER — HYDROMORPHONE HCL 2 MG PO TABS
2.0000 mg | ORAL_TABLET | ORAL | Status: DC | PRN
Start: 2014-03-21 — End: 2014-09-04

## 2014-03-21 NOTE — Progress Notes (Signed)
Patient was educated on discharge instruction. Patient has no further questions.

## 2014-03-21 NOTE — Discharge Summary (Signed)
Physician Discharge Summary  Patient ID: [name] MRN: [id number] DOB/AGE: [birth year] 57 y.o.  Admit date: 03/17/2014 Discharge date: 03/21/2014  Admission Diagnoses: Left Staghorn Kidney Stone  Discharge Diagnoses:  Active Problems:   Staghorn kidney stones   Discharged Condition: good  Hospital Course:   1 - Lt > Rt Large Volume Nephrolithiasis - s/p cysto + right stent pull + left first stage percutaneous nephrostolithotomy 03/17/14 partial staghorn left stone via upper pole access. Second stage left PCNL via lower pole access 6/24, estimated removed 90-95% total stone volume. Observed for continued pain control 6/25 and felt to be adequtae for discharge 6/26 as pain controlled, tolerating reg diet, afebrile, and ambulatory.   2 - Acute Renal Failure - Cr baseline < 1.5. Noted on AM labs 6/23 rise to 2.22. UOP acceptable. Received extended interval gentamicin 6/22 as part of peri-op prophylaxis. Cr back to baseline (1.3) at time of discharge with conservative measures suggesting likely med effect v. Temporary systemic resorption, resolved.    Consults: None  Significant Diagnostic Studies: labs: Hgb >10, Cr <1.5 at discharge.   Treatments: surgery: cysto + right stent pull + left first stage percutaneous nephrostolithotomy 03/17/14 partial staghorn left stone via upper pole access. Second stage left PCNL via lower pole access 6/24  Discharge Exam: Blood pressure 131/71, pulse 98, temperature 98 F (36.7 C), temperature source Oral, resp. rate 18, height 5\' 10"  (1.778 m), weight 88.451 kg (195 lb), SpO2 95.00%. General appearance: alert, cooperative and appears stated age Head: Normocephalic, without obvious abnormality, atraumatic Nose: Nares normal. Septum midline. Mucosa normal. No drainage or sinus tenderness. Throat: lips, mucosa, and tongue normal; teeth and gums normal Neck: supple, symmetrical, trachea midline Back: symmetric, no curvature. ROM normal. No CVA  tenderness. Resp: non-labored on room air Chest wall: no tenderness Cardio: Nl rate GI: soft, non-tender; bowel sounds normal; no masses,  no organomegaly Male genitalia: normal Extremities: extremities normal, atraumatic, no cyanosis or edema Pulses: 2+ and symmetric Skin: Skin color, texture, turgor normal. No rashes or lesions Lymph nodes: Cervical, supraclavicular, and axillary nodes normal. Neurologic: Grossly normal Incision/Wound: recent left flank sites c/d/i. No flank ecchymoses / hematoma.   Disposition: 01-Home or Self Care     Medication List         amLODipine 2.5 MG tablet  Commonly known as:  NORVASC  Take 2.5 mg by mouth every morning.     aspirin EC 81 MG tablet  Take 162 mg by mouth every morning.     atorvastatin 10 MG tablet  Commonly known as:  LIPITOR  Take 10 mg by mouth every evening.     cholecalciferol 1000 UNITS tablet  Commonly known as:  VITAMIN D  Take 1,000 Units by mouth daily.     gabapentin 100 MG capsule  Commonly known as:  NEURONTIN  Take 100 mg by mouth daily.     HYDROmorphone 2 MG tablet  Commonly known as:  DILAUDID  Take 1 tablet (2 mg total) by mouth every 4 (four) hours as needed for moderate pain or severe pain. Post-operatively     insulin glargine 100 UNIT/ML injection  Commonly known as:  LANTUS  Inject 20 Units into the skin at bedtime.     lisinopril 40 MG tablet  Commonly known as:  PRINIVIL,ZESTRIL  Take 40 mg by mouth every morning.     meloxicam 15 MG tablet  Commonly known as:  MOBIC  Take 15 mg by mouth daily.  metFORMIN 500 MG tablet  Commonly known as:  GLUCOPHAGE  Take 500 mg by mouth 4 (four) times daily.     multivitamins ther. w/minerals Tabs tablet  Take 1 tablet by mouth every morning.     Omega 3 1200 MG Caps  Take 1,200 mg by mouth daily.     oxybutynin 5 MG tablet  Commonly known as:  DITROPAN  Take 1 tablet (5 mg total) by mouth every 8 (eight) hours as needed for bladder spasms.  / stent discomfort     pioglitazone 15 MG tablet  Commonly known as:  ACTOS  Take 15 mg by mouth daily.     senna-docusate 8.6-50 MG per tablet  Commonly known as:  Senokot-S  Take 1 tablet by mouth 2 (two) times daily. While taking pain meds to prevent constipation     sulfamethoxazole-trimethoprim 800-160 MG per tablet  Commonly known as:  BACTRIM DS  Take 1 tablet by mouth 2 (two) times daily. X 3 days. Begin day prior to next Urology appointment.     Vitamin B-12 2500 MCG Subl  Place 1 tablet under the tongue daily.     vitamin C 1000 MG tablet  Take 1,000 mg by mouth daily.           Follow-up Information   Follow up with Alexis Frock, MD. (as scheduled)    Specialty:  Urology   Contact information:   [place] [id number] [phone]       Signed: Alexis Frock 03/21/2014, 6:55 AM

## 2014-03-21 NOTE — Discharge Instructions (Signed)
1 - You may have urinary urgency (bladder spasms) and bloody urine on / off with stent in place. This is normal. ° °2 - Call MD or go to ER for fever >102, severe pain / nausea / vomiting not relieved by medications, or acute change in medical status ° °

## 2014-08-15 ENCOUNTER — Other Ambulatory Visit (HOSPITAL_COMMUNITY): Payer: Self-pay | Admitting: Orthopedic Surgery

## 2014-08-25 NOTE — Pre-Procedure Instructions (Signed)
BARNABY RIPPEON  08/25/2014   Your procedure is scheduled on:  Tuesday, December 8th  Report to [facility] Admitting at 530 AM.  Call this number if you have problems the morning of surgery: [phone]   Remember:   Do not eat food or drink liquids after midnight.   Take these medicines the morning of surgery with A SIP OF WATER: norvasc, neurontin, dilaudid if needed  Stop taking aspirin, OTC vitamins/herbal medications (fish oil), NSAIDS (ibuprofen, advil, motrin, mobic) 7 days prior to surgery.    Do not wear jewelry, make-up or nail polish.  Do not wear lotions, powders, or perfumes. You may wear deodorant.  Do not shave 48 hours prior to surgery. Men may shave face and neck.  Do not bring valuables to the hospital.  [facility] is not responsible  for any belongings or valuables.               Contacts, dentures or bridgework may not be worn into surgery.  Leave suitcase in the car. After surgery it may be brought to your room.  For patients admitted to the hospital, discharge time is determined by your treatment team.            Please read over the following fact sheets that you were given: Pain Booklet, Coughing and Deep Breathing, Blood Transfusion Information, MRSA Information and Surgical Site Infection Prevention  [facility] - Preparing for Surgery  Before surgery, you can play an important role.  Because skin is not sterile, your skin needs to be as free of germs as possible.  You can reduce the number of germs on you skin by washing with CHG (chlorahexidine gluconate) soap before surgery.  CHG is an antiseptic cleaner which kills germs and bonds with the skin to continue killing germs even after washing.  Please DO NOT use if you have an allergy to CHG or antibacterial soaps.  If your skin becomes reddened/irritated stop using the CHG and inform your nurse when you arrive at Short Stay.  Do not shave (including legs and underarms) for at least 48 hours  prior to the first CHG shower.  You may shave your face.  Please follow these instructions carefully:   1.  Shower with CHG Soap the night before surgery and the morning of Surgery.  2.  If you choose to wash your hair, wash your hair first as usual with your normal shampoo.  3.  After you shampoo, rinse your hair and body thoroughly to remove the shampoo.  4.  Use CHG as you would any other liquid soap.  You can apply CHG directly to the skin and wash gently with scrungie or a clean washcloth.  5.  Apply the CHG Soap to your body ONLY FROM THE NECK DOWN.  Do not use on open wounds or open sores.  Avoid contact with your eyes, ears, mouth and genitals (private parts).  Wash genitals (private parts) with your normal soap.  6.  Wash thoroughly, paying special attention to the area where your surgery will be performed.  7.  Thoroughly rinse your body with warm water from the neck down.  8.  DO NOT shower/wash with your normal soap after using and rinsing off the CHG Soap.  9.  Pat yourself dry with a clean towel.            10.  Wear clean pajamas.            11.  Place clean sheets on your bed the night of your first shower and do not sleep with pets.  Day of Surgery  Do not apply any lotions/deoderants the morning of surgery.  Please wear clean clothes to the hospital/surgery center.

## 2014-08-26 ENCOUNTER — Encounter (HOSPITAL_COMMUNITY)
Admission: RE | Admit: 2014-08-26 | Discharge: 2014-08-26 | Disposition: A | Discharge status: Home / Self Care | Payer: BC Managed Care – PPO | Source: Ambulatory Visit | Attending: Orthopedic Surgery | Admitting: Orthopedic Surgery

## 2014-08-26 ENCOUNTER — Encounter (HOSPITAL_COMMUNITY): Payer: Self-pay

## 2014-08-26 DIAGNOSIS — Z01812 Encounter for preprocedural laboratory examination: Secondary | ICD-10-CM | POA: Diagnosis not present

## 2014-08-26 HISTORY — DX: Pneumonia, unspecified organism: J18.9

## 2014-08-26 LAB — CBC
HEMATOCRIT: 44.1 % (ref 39.0–52.0)
HEMOGLOBIN: 14.5 g/dL (ref 13.0–17.0)
MCH: 29.9 pg (ref 26.0–34.0)
MCHC: 32.9 g/dL (ref 30.0–36.0)
MCV: 90.9 fL (ref 78.0–100.0)
Platelets: 292 10*3/uL (ref 150–400)
RBC: 4.85 MIL/uL (ref 4.22–5.81)
RDW: 13 % (ref 11.5–15.5)
WBC: 9.1 10*3/uL (ref 4.0–10.5)

## 2014-08-26 LAB — URINALYSIS, ROUTINE W REFLEX MICROSCOPIC
Bilirubin Urine: NEGATIVE
Glucose, UA: NEGATIVE mg/dL
Ketones, ur: NEGATIVE mg/dL
Nitrite: NEGATIVE
Protein, ur: NEGATIVE mg/dL
SPECIFIC GRAVITY, URINE: 1.023 (ref 1.005–1.030)
UROBILINOGEN UA: 0.2 mg/dL (ref 0.0–1.0)
pH: 5 (ref 5.0–8.0)

## 2014-08-26 LAB — BASIC METABOLIC PANEL
ANION GAP: 14 (ref 5–15)
BUN: 17 mg/dL (ref 6–23)
CHLORIDE: 103 meq/L (ref 96–112)
CO2: 24 meq/L (ref 19–32)
Calcium: 9.9 mg/dL (ref 8.4–10.5)
Creatinine, Ser: 0.72 mg/dL (ref 0.50–1.35)
GFR calc non Af Amer: >90 mL/min (ref >90)
Glucose, Bld: 130 mg/dL — ABNORMAL HIGH (ref 70–99)
Potassium: 4.4 mEq/L (ref 3.7–5.3)
Sodium: 141 mEq/L (ref 137–147)

## 2014-08-26 LAB — TYPE AND SCREEN
ABO/RH(D): A POS
Antibody Screen: NEGATIVE

## 2014-08-26 LAB — URINE MICROSCOPIC-ADD ON

## 2014-08-26 LAB — PROTIME-INR
INR: 1.01 (ref 0.00–1.49)
Prothrombin Time: 13.4 seconds (ref 11.6–15.2)

## 2014-08-26 LAB — ABO/RH: ABO/RH(D): A POS

## 2014-08-26 LAB — SURGICAL PCR SCREEN
MRSA, PCR: NEGATIVE
STAPHYLOCOCCUS AUREUS: POSITIVE — AB

## 2014-08-26 LAB — APTT: APTT: 33 s (ref 24–37)

## 2014-08-26 NOTE — Progress Notes (Signed)
rx called in for mupirocin to [facility] per patient request

## 2014-08-26 NOTE — Progress Notes (Signed)
Primary - dr. Tobe Sos - [facility] - [facility] No cardiologist ekg in epic from June 2015 no other cardiac testing in past

## 2014-08-27 LAB — URINE CULTURE
Colony Count: NO GROWTH
Culture: NO GROWTH

## 2014-09-01 MED ORDER — CEFAZOLIN SODIUM-DEXTROSE 2-3 GM-% IV SOLR
2.0000 g | INTRAVENOUS | Status: AC
  Administered 2014-09-02: 2 g via INTRAVENOUS
  Filled 2014-09-01: qty 50

## 2014-09-02 ENCOUNTER — Encounter (HOSPITAL_COMMUNITY): Payer: Self-pay | Admitting: *Deleted

## 2014-09-02 ENCOUNTER — Inpatient Hospital Stay (HOSPITAL_COMMUNITY): Payer: BC Managed Care – PPO | Admitting: Anesthesiology

## 2014-09-02 ENCOUNTER — Encounter (HOSPITAL_COMMUNITY)
Admission: RE | Disposition: A | Discharge status: Home / Self Care | Payer: Self-pay | Source: Ambulatory Visit | Attending: Orthopedic Surgery

## 2014-09-02 ENCOUNTER — Inpatient Hospital Stay (HOSPITAL_COMMUNITY)
Admission: RE | Admit: 2014-09-02 | Discharge: 2014-09-04 | DRG: 470 | Disposition: A | Discharge status: Home / Self Care | Payer: BC Managed Care – PPO | Source: Ambulatory Visit | Attending: Orthopedic Surgery | Admitting: Orthopedic Surgery

## 2014-09-02 DIAGNOSIS — Z9049 Acquired absence of other specified parts of digestive tract: Secondary | ICD-10-CM | POA: Diagnosis present

## 2014-09-02 DIAGNOSIS — Z87442 Personal history of urinary calculi: Secondary | ICD-10-CM | POA: Diagnosis not present

## 2014-09-02 DIAGNOSIS — I10 Essential (primary) hypertension: Secondary | ICD-10-CM | POA: Diagnosis present

## 2014-09-02 DIAGNOSIS — Z794 Long term (current) use of insulin: Secondary | ICD-10-CM | POA: Diagnosis not present

## 2014-09-02 DIAGNOSIS — Z7982 Long term (current) use of aspirin: Secondary | ICD-10-CM

## 2014-09-02 DIAGNOSIS — G629 Polyneuropathy, unspecified: Secondary | ICD-10-CM | POA: Diagnosis present

## 2014-09-02 DIAGNOSIS — M25561 Pain in right knee: Secondary | ICD-10-CM | POA: Diagnosis present

## 2014-09-02 DIAGNOSIS — M1710 Unilateral primary osteoarthritis, unspecified knee: Secondary | ICD-10-CM | POA: Diagnosis present

## 2014-09-02 DIAGNOSIS — Z8701 Personal history of pneumonia (recurrent): Secondary | ICD-10-CM

## 2014-09-02 DIAGNOSIS — Z936 Other artificial openings of urinary tract status: Secondary | ICD-10-CM

## 2014-09-02 DIAGNOSIS — M1711 Unilateral primary osteoarthritis, right knee: Secondary | ICD-10-CM | POA: Diagnosis present

## 2014-09-02 DIAGNOSIS — E119 Type 2 diabetes mellitus without complications: Secondary | ICD-10-CM | POA: Diagnosis present

## 2014-09-02 HISTORY — PX: TOTAL KNEE ARTHROPLASTY: SHX125

## 2014-09-02 LAB — GLUCOSE, CAPILLARY
Glucose-Capillary: 116 mg/dL — ABNORMAL HIGH (ref 70–99)
Glucose-Capillary: 145 mg/dL — ABNORMAL HIGH (ref 70–99)
Glucose-Capillary: 199 mg/dL — ABNORMAL HIGH (ref 70–99)
Glucose-Capillary: 143 mg/dL — ABNORMAL HIGH (ref 70–99)

## 2014-09-02 SURGERY — ARTHROPLASTY, KNEE, TOTAL
Anesthesia: Regional | Site: Knee | Laterality: Right

## 2014-09-02 MED ORDER — VITAMIN C 500 MG PO TABS
1000.0000 mg | ORAL_TABLET | Freq: Every day | ORAL | Status: DC
  Administered 2014-09-02 – 2014-09-04 (×3): 1000 mg via ORAL
  Filled 2014-09-02 (×3): qty 2

## 2014-09-02 MED ORDER — HYDROMORPHONE HCL 1 MG/ML IJ SOLN
1.0000 mg | INTRAMUSCULAR | Status: DC | PRN
  Administered 2014-09-02 – 2014-09-03 (×4): 1 mg via INTRAVENOUS
  Filled 2014-09-02 (×4): qty 1

## 2014-09-02 MED ORDER — PROPOFOL 10 MG/ML IV BOLUS
INTRAVENOUS | Status: AC
  Filled 2014-09-02: qty 20

## 2014-09-02 MED ORDER — OXYCODONE HCL 5 MG/5ML PO SOLN: 5.0000 mg | Freq: Once | ORAL | Status: AC | PRN

## 2014-09-02 MED ORDER — WARFARIN - PHARMACIST DOSING INPATIENT
Freq: Every day | Status: DC
  Administered 2014-09-02: 18:00:00

## 2014-09-02 MED ORDER — LIDOCAINE HCL (CARDIAC) 20 MG/ML IV SOLN
INTRAVENOUS | Status: DC | PRN
  Administered 2014-09-02: 80 mg via INTRAVENOUS

## 2014-09-02 MED ORDER — ACETAMINOPHEN 325 MG PO TABS
650.0000 mg | ORAL_TABLET | Freq: Four times a day (QID) | ORAL | Status: DC | PRN
  Administered 2014-09-03: 650 mg via ORAL
  Filled 2014-09-02: qty 2

## 2014-09-02 MED ORDER — TRANEXAMIC ACID 100 MG/ML IV SOLN
1000.0000 mg | INTRAVENOUS | Status: AC
  Administered 2014-09-02: 1000 mg via INTRAVENOUS
  Filled 2014-09-02: qty 10

## 2014-09-02 MED ORDER — PIOGLITAZONE HCL 15 MG PO TABS
15.0000 mg | ORAL_TABLET | Freq: Every day | ORAL | Status: DC
  Administered 2014-09-02 – 2014-09-04 (×3): 15 mg via ORAL
  Filled 2014-09-02 (×3): qty 1

## 2014-09-02 MED ORDER — METHOCARBAMOL 500 MG PO TABS
500.0000 mg | ORAL_TABLET | Freq: Four times a day (QID) | ORAL | Status: DC | PRN
  Administered 2014-09-02 – 2014-09-04 (×7): 500 mg via ORAL
  Filled 2014-09-02 (×6): qty 1

## 2014-09-02 MED ORDER — PROPOFOL 10 MG/ML IV BOLUS
INTRAVENOUS | Status: DC | PRN
  Administered 2014-09-02: 200 mg via INTRAVENOUS

## 2014-09-02 MED ORDER — LABETALOL HCL 5 MG/ML IV SOLN
INTRAVENOUS | Status: AC
  Filled 2014-09-02: qty 4

## 2014-09-02 MED ORDER — FENTANYL CITRATE 0.05 MG/ML IJ SOLN
INTRAMUSCULAR | Status: DC | PRN
  Administered 2014-09-02 (×5): 50 ug via INTRAVENOUS

## 2014-09-02 MED ORDER — SODIUM CHLORIDE 0.9 % IJ SOLN
INTRAMUSCULAR | Status: DC | PRN
  Administered 2014-09-02: 60 mL via INTRAVENOUS

## 2014-09-02 MED ORDER — ONDANSETRON HCL 4 MG/2ML IJ SOLN
INTRAMUSCULAR | Status: AC
  Filled 2014-09-02: qty 2

## 2014-09-02 MED ORDER — COUMADIN BOOK
Freq: Once | Status: AC
  Administered 2014-09-02: 18:00:00
  Filled 2014-09-02: qty 1

## 2014-09-02 MED ORDER — INSULIN ASPART 100 UNIT/ML ~~LOC~~ SOLN
0.0000 [IU] | Freq: Three times a day (TID) | SUBCUTANEOUS | Status: DC
  Administered 2014-09-02: 3 [IU] via SUBCUTANEOUS
  Administered 2014-09-03 (×3): 2 [IU] via SUBCUTANEOUS
  Administered 2014-09-04: 3 [IU] via SUBCUTANEOUS

## 2014-09-02 MED ORDER — ADULT MULTIVITAMIN W/MINERALS CH
1.0000 | ORAL_TABLET | Freq: Every day | ORAL | Status: DC
  Administered 2014-09-02 – 2014-09-04 (×3): 1 via ORAL
  Filled 2014-09-02 (×3): qty 1

## 2014-09-02 MED ORDER — CHLORHEXIDINE GLUCONATE 4 % EX LIQD
60.0000 mL | Freq: Once | CUTANEOUS | Status: DC
  Filled 2014-09-02: qty 60

## 2014-09-02 MED ORDER — ONDANSETRON HCL 4 MG/2ML IJ SOLN
INTRAMUSCULAR | Status: DC | PRN
  Administered 2014-09-02: 4 mg via INTRAVENOUS

## 2014-09-02 MED ORDER — LIDOCAINE HCL (CARDIAC) 20 MG/ML IV SOLN
INTRAVENOUS | Status: AC
  Filled 2014-09-02: qty 5

## 2014-09-02 MED ORDER — GABAPENTIN 300 MG PO CAPS
300.0000 mg | ORAL_CAPSULE | Freq: Three times a day (TID) | ORAL | Status: DC
  Administered 2014-09-02 – 2014-09-04 (×6): 300 mg via ORAL
  Filled 2014-09-02 (×8): qty 1

## 2014-09-02 MED ORDER — EPHEDRINE SULFATE 50 MG/ML IJ SOLN
INTRAMUSCULAR | Status: AC
  Filled 2014-09-02: qty 1

## 2014-09-02 MED ORDER — METHOCARBAMOL 500 MG PO TABS
ORAL_TABLET | ORAL | Status: AC
  Filled 2014-09-02: qty 1

## 2014-09-02 MED ORDER — ROCURONIUM BROMIDE 50 MG/5ML IV SOLN
INTRAVENOUS | Status: AC
  Filled 2014-09-02: qty 1

## 2014-09-02 MED ORDER — DEXAMETHASONE SODIUM PHOSPHATE 4 MG/ML IJ SOLN
INTRAMUSCULAR | Status: AC
  Filled 2014-09-02: qty 1

## 2014-09-02 MED ORDER — PHENOL 1.4 % MT LIQD: 1.0000 | OROMUCOSAL | Status: DC | PRN

## 2014-09-02 MED ORDER — WARFARIN VIDEO
Freq: Once | Status: AC
  Administered 2014-09-02: 18:00:00

## 2014-09-02 MED ORDER — ROCURONIUM BROMIDE 100 MG/10ML IV SOLN
INTRAVENOUS | Status: DC | PRN
  Administered 2014-09-02: 50 mg via INTRAVENOUS

## 2014-09-02 MED ORDER — MENTHOL 3 MG MT LOZG: 1.0000 | LOZENGE | OROMUCOSAL | Status: DC | PRN

## 2014-09-02 MED ORDER — WARFARIN SODIUM 7.5 MG PO TABS
7.5000 mg | ORAL_TABLET | Freq: Once | ORAL | Status: AC
  Administered 2014-09-02: 7.5 mg via ORAL
  Filled 2014-09-02: qty 1

## 2014-09-02 MED ORDER — ATORVASTATIN CALCIUM 10 MG PO TABS
10.0000 mg | ORAL_TABLET | Freq: Every evening | ORAL | Status: DC
  Administered 2014-09-02 – 2014-09-03 (×2): 10 mg via ORAL
  Filled 2014-09-02 (×3): qty 1

## 2014-09-02 MED ORDER — FENTANYL CITRATE 0.05 MG/ML IJ SOLN
INTRAMUSCULAR | Status: AC
  Filled 2014-09-02: qty 5

## 2014-09-02 MED ORDER — THERA M PLUS PO TABS: 1.0000 | ORAL_TABLET | Freq: Every morning | ORAL | Status: DC

## 2014-09-02 MED ORDER — VITAMIN B-12 1000 MCG PO TABS
2500.0000 ug | ORAL_TABLET | Freq: Every day | ORAL | Status: DC
  Administered 2014-09-02 – 2014-09-04 (×3): 2500 ug via ORAL
  Filled 2014-09-02 (×3): qty 1

## 2014-09-02 MED ORDER — INSULIN GLARGINE 100 UNIT/ML ~~LOC~~ SOLN
20.0000 [IU] | Freq: Every day | SUBCUTANEOUS | Status: DC
  Administered 2014-09-02 – 2014-09-03 (×2): 20 [IU] via SUBCUTANEOUS
  Filled 2014-09-02 (×3): qty 0.2

## 2014-09-02 MED ORDER — CLONIDINE HCL (ANALGESIA) 100 MCG/ML EP SOLN
150.0000 ug | Freq: Once | EPIDURAL | Status: AC
  Administered 2014-09-02: 100 mL via INTRA_ARTICULAR
  Filled 2014-09-02: qty 1.5

## 2014-09-02 MED ORDER — ARTIFICIAL TEARS OP OINT
TOPICAL_OINTMENT | OPHTHALMIC | Status: DC | PRN
  Administered 2014-09-02: 1 via OPHTHALMIC

## 2014-09-02 MED ORDER — BUPIVACAINE-EPINEPHRINE (PF) 0.5% -1:200000 IJ SOLN
INTRAMUSCULAR | Status: DC | PRN
  Administered 2014-09-02: 30 mL via PERINEURAL

## 2014-09-02 MED ORDER — MIDAZOLAM HCL 5 MG/5ML IJ SOLN
INTRAMUSCULAR | Status: DC | PRN
  Administered 2014-09-02: 2 mg via INTRAVENOUS

## 2014-09-02 MED ORDER — OXYCODONE HCL 5 MG PO TABS
5.0000 mg | ORAL_TABLET | Freq: Once | ORAL | Status: AC | PRN
  Administered 2014-09-02: 5 mg via ORAL

## 2014-09-02 MED ORDER — CEFAZOLIN SODIUM 1-5 GM-% IV SOLN
1.0000 g | Freq: Three times a day (TID) | INTRAVENOUS | Status: AC
  Administered 2014-09-02 (×2): 1 g via INTRAVENOUS
  Filled 2014-09-02 (×2): qty 50

## 2014-09-02 MED ORDER — MONTELUKAST SODIUM 10 MG PO TABS
10.0000 mg | ORAL_TABLET | Freq: Every day | ORAL | Status: DC
  Administered 2014-09-02 – 2014-09-03 (×2): 10 mg via ORAL
  Filled 2014-09-02 (×3): qty 1

## 2014-09-02 MED ORDER — ARTIFICIAL TEARS OP OINT
TOPICAL_OINTMENT | OPHTHALMIC | Status: AC
  Filled 2014-09-02: qty 3.5

## 2014-09-02 MED ORDER — 0.9 % SODIUM CHLORIDE (POUR BTL) OPTIME
TOPICAL | Status: DC | PRN
  Administered 2014-09-02: 1000 mL

## 2014-09-02 MED ORDER — SUCCINYLCHOLINE CHLORIDE 20 MG/ML IJ SOLN
INTRAMUSCULAR | Status: AC
  Filled 2014-09-02: qty 1

## 2014-09-02 MED ORDER — ACETAMINOPHEN 650 MG RE SUPP: 650.0000 mg | Freq: Four times a day (QID) | RECTAL | Status: DC | PRN

## 2014-09-02 MED ORDER — ONDANSETRON HCL 4 MG/2ML IJ SOLN: 4.0000 mg | Freq: Four times a day (QID) | INTRAMUSCULAR | Status: DC | PRN

## 2014-09-02 MED ORDER — VITAMIN C 500 MG PO TABS: 1000.0000 mg | ORAL_TABLET | Freq: Every day | ORAL | Status: DC

## 2014-09-02 MED ORDER — METOCLOPRAMIDE HCL 5 MG/ML IJ SOLN: 5.0000 mg | Freq: Three times a day (TID) | INTRAMUSCULAR | Status: DC | PRN

## 2014-09-02 MED ORDER — HYDROMORPHONE HCL 1 MG/ML IJ SOLN
0.2500 mg | INTRAMUSCULAR | Status: DC | PRN
  Administered 2014-09-02 (×3): 0.5 mg via INTRAVENOUS

## 2014-09-02 MED ORDER — BUPIVACAINE-EPINEPHRINE (PF) 0.5% -1:200000 IJ SOLN
INTRAMUSCULAR | Status: AC
  Filled 2014-09-02: qty 30

## 2014-09-02 MED ORDER — MIDAZOLAM HCL 2 MG/2ML IJ SOLN
INTRAMUSCULAR | Status: AC
  Filled 2014-09-02: qty 2

## 2014-09-02 MED ORDER — MORPHINE SULFATE 4 MG/ML IJ SOLN
INTRAMUSCULAR | Status: AC
  Filled 2014-09-02: qty 1

## 2014-09-02 MED ORDER — LACTATED RINGERS IV SOLN
INTRAVENOUS | Status: DC | PRN
  Administered 2014-09-02 (×2): via INTRAVENOUS

## 2014-09-02 MED ORDER — LISINOPRIL 40 MG PO TABS
40.0000 mg | ORAL_TABLET | Freq: Every morning | ORAL | Status: DC
  Administered 2014-09-03: 40 mg via ORAL
  Filled 2014-09-02 (×2): qty 1

## 2014-09-02 MED ORDER — AMLODIPINE BESYLATE 2.5 MG PO TABS
2.5000 mg | ORAL_TABLET | Freq: Every morning | ORAL | Status: DC
  Administered 2014-09-03: 2.5 mg via ORAL
  Filled 2014-09-02 (×2): qty 1

## 2014-09-02 MED ORDER — METHOCARBAMOL 1000 MG/10ML IJ SOLN
500.0000 mg | Freq: Four times a day (QID) | INTRAVENOUS | Status: DC | PRN
  Filled 2014-09-02: qty 5

## 2014-09-02 MED ORDER — OXYCODONE HCL 5 MG PO TABS
ORAL_TABLET | ORAL | Status: AC
  Filled 2014-09-02: qty 1

## 2014-09-02 MED ORDER — BUPIVACAINE HCL (PF) 0.25 % IJ SOLN
INTRAMUSCULAR | Status: DC | PRN
  Administered 2014-09-02: 30 mL

## 2014-09-02 MED ORDER — GLYCOPYRROLATE 0.2 MG/ML IJ SOLN
INTRAMUSCULAR | Status: AC
  Filled 2014-09-02: qty 3

## 2014-09-02 MED ORDER — SODIUM CHLORIDE 0.9 % IR SOLN
Status: DC | PRN
  Administered 2014-09-02 (×2): 3000 mL

## 2014-09-02 MED ORDER — ONDANSETRON HCL 4 MG PO TABS: 4.0000 mg | ORAL_TABLET | Freq: Four times a day (QID) | ORAL | Status: DC | PRN

## 2014-09-02 MED ORDER — HYDROMORPHONE HCL 1 MG/ML IJ SOLN
INTRAMUSCULAR | Status: AC
  Filled 2014-09-02: qty 1

## 2014-09-02 MED ORDER — METFORMIN HCL 500 MG PO TABS
1000.0000 mg | ORAL_TABLET | Freq: Two times a day (BID) | ORAL | Status: DC
  Administered 2014-09-02 – 2014-09-04 (×4): 1000 mg via ORAL
  Filled 2014-09-02 (×6): qty 2

## 2014-09-02 MED ORDER — VITAMIN D3 25 MCG (1000 UNIT) PO TABS
1000.0000 [IU] | ORAL_TABLET | Freq: Every day | ORAL | Status: DC
  Administered 2014-09-02 – 2014-09-04 (×3): 1000 [IU] via ORAL
  Filled 2014-09-02 (×3): qty 1

## 2014-09-02 MED ORDER — MORPHINE SULFATE 4 MG/ML IJ SOLN
INTRAMUSCULAR | Status: DC | PRN
  Administered 2014-09-02: 8 mg via SUBCUTANEOUS

## 2014-09-02 MED ORDER — ASPIRIN EC 81 MG PO TBEC
162.0000 mg | DELAYED_RELEASE_TABLET | Freq: Every morning | ORAL | Status: DC
  Administered 2014-09-03 – 2014-09-04 (×2): 162 mg via ORAL
  Filled 2014-09-02 (×2): qty 2

## 2014-09-02 MED ORDER — NEOSTIGMINE METHYLSULFATE 10 MG/10ML IV SOLN
INTRAVENOUS | Status: AC
  Filled 2014-09-02: qty 1

## 2014-09-02 MED ORDER — PHENYLEPHRINE HCL 10 MG/ML IJ SOLN
INTRAMUSCULAR | Status: DC | PRN
  Administered 2014-09-02: 80 ug via INTRAVENOUS
  Administered 2014-09-02 (×2): 40 ug via INTRAVENOUS

## 2014-09-02 MED ORDER — BUPIVACAINE HCL (PF) 0.25 % IJ SOLN
INTRAMUSCULAR | Status: AC
  Filled 2014-09-02: qty 30

## 2014-09-02 MED ORDER — SODIUM CHLORIDE 0.9 % IJ SOLN
INTRAMUSCULAR | Status: AC
  Filled 2014-09-02: qty 10

## 2014-09-02 MED ORDER — VITAMIN B-12 2500 MCG SL SUBL: 1.0000 | SUBLINGUAL_TABLET | Freq: Every day | SUBLINGUAL | Status: DC

## 2014-09-02 MED ORDER — METOCLOPRAMIDE HCL 10 MG PO TABS: 5.0000 mg | ORAL_TABLET | Freq: Three times a day (TID) | ORAL | Status: DC | PRN

## 2014-09-02 MED ORDER — OXYCODONE HCL 5 MG PO TABS
5.0000 mg | ORAL_TABLET | ORAL | Status: DC | PRN
  Administered 2014-09-02: 5 mg via ORAL
  Administered 2014-09-03 – 2014-09-04 (×9): 10 mg via ORAL
  Filled 2014-09-02 (×9): qty 2
  Filled 2014-09-02: qty 1

## 2014-09-02 MED ORDER — LABETALOL HCL 5 MG/ML IV SOLN
INTRAVENOUS | Status: DC | PRN
  Administered 2014-09-02: 2.5 mg via INTRAVENOUS

## 2014-09-02 MED ORDER — OXYBUTYNIN CHLORIDE 5 MG PO TABS
5.0000 mg | ORAL_TABLET | Freq: Three times a day (TID) | ORAL | Status: DC | PRN
  Filled 2014-09-02: qty 1

## 2014-09-02 MED ORDER — GLYCOPYRROLATE 0.2 MG/ML IJ SOLN
INTRAMUSCULAR | Status: DC | PRN
  Administered 2014-09-02: 0.6 mg via INTRAVENOUS

## 2014-09-02 MED ORDER — BUPIVACAINE LIPOSOME 1.3 % IJ SUSP
20.0000 mL | Freq: Once | INTRAMUSCULAR | Status: AC
  Administered 2014-09-02: 20 mL
  Filled 2014-09-02: qty 20

## 2014-09-02 MED ORDER — NEOSTIGMINE METHYLSULFATE 10 MG/10ML IV SOLN
INTRAVENOUS | Status: DC | PRN
  Administered 2014-09-02: 4 mg via INTRAVENOUS

## 2014-09-02 SURGICAL SUPPLY — 81 items
BANDAGE ELASTIC 4 VELCRO ST LF (GAUZE/BANDAGES/DRESSINGS) ×3 IMPLANT
BANDAGE ESMARK 6X9 LF (GAUZE/BANDAGES/DRESSINGS) ×1 IMPLANT
BLADE SAG 18X100X1.27 (BLADE) ×3 IMPLANT
BLADE SAW SGTL 13.0X1.19X90.0M (BLADE) ×3 IMPLANT
BNDG CMPR 9X6 STRL LF SNTH (GAUZE/BANDAGES/DRESSINGS) ×1
BNDG CMPR MED 10X6 ELC LF (GAUZE/BANDAGES/DRESSINGS) ×3
BNDG CMPR MED 15X6 ELC VLCR LF (GAUZE/BANDAGES/DRESSINGS) ×1
BNDG COHESIVE 6X5 TAN STRL LF (GAUZE/BANDAGES/DRESSINGS) ×3 IMPLANT
BNDG ELASTIC 6X10 VLCR STRL LF (GAUZE/BANDAGES/DRESSINGS) ×9 IMPLANT
BNDG ELASTIC 6X15 VLCR STRL LF (GAUZE/BANDAGES/DRESSINGS) ×2 IMPLANT
BNDG ESMARK 6X9 LF (GAUZE/BANDAGES/DRESSINGS) ×3
BOWL SMART MIX CTS (DISPOSABLE) ×3 IMPLANT
CAPT KNEE TOTAL 3 ×2 IMPLANT
CEMENT BONE SIMPLEX SPEEDSET (Cement) ×4 IMPLANT
COVER SURGICAL LIGHT HANDLE (MISCELLANEOUS) ×3 IMPLANT
CUFF TOURNIQUET SINGLE 34IN LL (TOURNIQUET CUFF) ×2 IMPLANT
CUFF TOURNIQUET SINGLE 44IN (TOURNIQUET CUFF) IMPLANT
DRAPE IMP U-DRAPE 54X76 (DRAPES) ×3 IMPLANT
DRAPE INCISE IOBAN 66X45 STRL (DRAPES) ×2 IMPLANT
DRAPE ORTHO SPLIT 77X108 STRL (DRAPES) ×9
DRAPE SURG ORHT 6 SPLT 77X108 (DRAPES) ×3 IMPLANT
DRAPE U-SHAPE 47X51 STRL (DRAPES) ×3 IMPLANT
DRSG PAD ABDOMINAL 8X10 ST (GAUZE/BANDAGES/DRESSINGS) ×6 IMPLANT
DURAPREP 26ML APPLICATOR (WOUND CARE) ×3 IMPLANT
ELECT REM PT RETURN 9FT ADLT (ELECTROSURGICAL) ×3
ELECTRODE REM PT RTRN 9FT ADLT (ELECTROSURGICAL) ×1 IMPLANT
FACESHIELD WRAPAROUND (MASK) ×3 IMPLANT
FACESHIELD WRAPAROUND OR TEAM (MASK) ×1 IMPLANT
GAUZE SPONGE 4X4 12PLY STRL (GAUZE/BANDAGES/DRESSINGS) ×3 IMPLANT
GAUZE XEROFORM 5X9 LF (GAUZE/BANDAGES/DRESSINGS) ×3 IMPLANT
GLOVE BIOGEL PI IND STRL 7.5 (GLOVE) ×1 IMPLANT
GLOVE BIOGEL PI IND STRL 8 (GLOVE) ×1 IMPLANT
GLOVE BIOGEL PI INDICATOR 7.5 (GLOVE) ×6
GLOVE BIOGEL PI INDICATOR 8 (GLOVE) ×2
GLOVE ECLIPSE 7.0 STRL STRAW (GLOVE) ×6 IMPLANT
GLOVE SURG ORTHO 8.0 STRL STRW (GLOVE) ×3 IMPLANT
GOWN STRL REUS W/ TWL LRG LVL3 (GOWN DISPOSABLE) ×3 IMPLANT
GOWN STRL REUS W/ TWL XL LVL3 (GOWN DISPOSABLE) ×1 IMPLANT
GOWN STRL REUS W/TWL LRG LVL3 (GOWN DISPOSABLE) ×9
GOWN STRL REUS W/TWL XL LVL3 (GOWN DISPOSABLE) ×3
HANDPIECE INTERPULSE COAX TIP (DISPOSABLE) ×3
HOOD PEEL AWAY FACE SHEILD DIS (HOOD) ×9 IMPLANT
IMMOBILIZER KNEE 20 (SOFTGOODS) IMPLANT
IMMOBILIZER KNEE 22 UNIV (SOFTGOODS) ×2 IMPLANT
IMMOBILIZER KNEE 24 THIGH 36 (MISCELLANEOUS) IMPLANT
IMMOBILIZER KNEE 24 UNIV (MISCELLANEOUS)
KIT BASIN OR (CUSTOM PROCEDURE TRAY) ×3 IMPLANT
KIT ROOM TURNOVER OR (KITS) ×3 IMPLANT
MANIFOLD NEPTUNE II (INSTRUMENTS) ×3 IMPLANT
NDL 18GX1X1/2 (RX/OR ONLY) (NEEDLE) ×1 IMPLANT
NDL SPNL 18GX3.5 QUINCKE PK (NEEDLE) ×1 IMPLANT
NEEDLE 18GX1X1/2 (RX/OR ONLY) (NEEDLE) ×3 IMPLANT
NEEDLE 21X1 OR PACK (NEEDLE) ×1 IMPLANT
NEEDLE HYPO 22GX1.5 SAFETY (NEEDLE) ×3 IMPLANT
NEEDLE SPNL 18GX3.5 QUINCKE PK (NEEDLE) ×3 IMPLANT
NS IRRIG 1000ML POUR BTL (IV SOLUTION) ×6 IMPLANT
PACK TOTAL JOINT (CUSTOM PROCEDURE TRAY) ×3 IMPLANT
PACK UNIVERSAL I (CUSTOM PROCEDURE TRAY) ×3 IMPLANT
PAD ARMBOARD 7.5X6 YLW CONV (MISCELLANEOUS) ×6 IMPLANT
PAD CAST 4YDX4 CTTN HI CHSV (CAST SUPPLIES) ×1 IMPLANT
PADDING CAST COTTON 4X4 STRL (CAST SUPPLIES) ×3
PADDING CAST COTTON 6X4 STRL (CAST SUPPLIES) ×5 IMPLANT
RUBBERBAND STERILE (MISCELLANEOUS) IMPLANT
SET HNDPC FAN SPRY TIP SCT (DISPOSABLE) ×1 IMPLANT
SPONGE LAP 18X18 X RAY DECT (DISPOSABLE) IMPLANT
STAPLER VISISTAT 35W (STAPLE) ×3 IMPLANT
SUCTION FRAZIER TIP 10 FR DISP (SUCTIONS) ×5 IMPLANT
SUT VIC AB 0 CTB1 27 (SUTURE) ×11 IMPLANT
SUT VIC AB 1 CT1 27 (SUTURE) ×15
SUT VIC AB 1 CT1 27XBRD ANBCTR (SUTURE) ×5 IMPLANT
SUT VIC AB 2-0 CT1 27 (SUTURE) ×9
SUT VIC AB 2-0 CT1 TAPERPNT 27 (SUTURE) ×2 IMPLANT
SYR 30ML LL (SYRINGE) ×3 IMPLANT
SYR 30ML SLIP (SYRINGE) ×3 IMPLANT
SYR 50ML LL SCALE MARK (SYRINGE) ×2 IMPLANT
SYR TB 1ML LUER SLIP (SYRINGE) ×3 IMPLANT
TOWEL OR 17X24 6PK STRL BLUE (TOWEL DISPOSABLE) ×6 IMPLANT
TOWEL OR 17X26 10 PK STRL BLUE (TOWEL DISPOSABLE) ×6 IMPLANT
TRAY FOLEY CATH 16FRSI W/METER (SET/KITS/TRAYS/PACK) ×1 IMPLANT
WATER STERILE IRR 1000ML POUR (IV SOLUTION) ×2 IMPLANT
YANKAUER SUCT BULB TIP NO VENT (SUCTIONS) ×2 IMPLANT

## 2014-09-02 NOTE — Transfer of Care (Signed)
Immediate Anesthesia Transfer of Care Note  Patient: [name]  Procedure(s) Performed: Procedure(s): RIGHT TOTAL KNEE ARTHROPLASTY (Right)  Patient Location: PACU  Anesthesia Type:General and Regional  Level of Consciousness: awake, alert , oriented and sedated  Airway & Oxygen Therapy: Patient Spontanous Breathing, Patient connected to nasal cannula oxygen and Patient connected to face mask oxygen  Post-op Assessment: Report given to PACU RN, Post -op Vital signs reviewed and stable and Patient moving all extremities  Post vital signs: Reviewed and stable  Complications: No apparent anesthesia complications

## 2014-09-02 NOTE — Progress Notes (Signed)
Knee precautions

## 2014-09-02 NOTE — Anesthesia Procedure Notes (Addendum)
Anesthesia Regional Block:  Adductor canal block  Pre-Anesthetic Checklist: ,, timeout performed, Correct Patient, Correct Site, Correct Laterality, Correct Procedure, Correct Position, site marked, Risks and benefits discussed,  Surgical consent,  Pre-op evaluation,  At surgeon's request and post-op pain management  Laterality: Right  Prep: chloraprep       Needles:   Needle Type: Echogenic Needle     Needle Length: 9cm 9 cm Needle Gauge: 21 and 21 G    Additional Needles:  Procedures: ultrasound guided (picture in chart) Adductor canal block Narrative:  Start time: 09/02/2014 7:13 AM End time: 09/02/2014 7:23 AM Injection made incrementally with aspirations every 5 mL.  Performed by: Personally  Anesthesiologist: HODIERNE, ADAM  Additional Notes: Pt tolerated the procedure well.   Procedure Name: Intubation Date/Time: 09/02/2014 8:51 AM Performed by: Scheryl Darter Pre-anesthesia Checklist: Patient identified, Emergency Drugs available, Suction available and Patient being monitored Patient Re-evaluated:Patient Re-evaluated prior to inductionOxygen Delivery Method: Circle system utilized Preoxygenation: Pre-oxygenation with 100% oxygen Intubation Type: IV induction Ventilation: Mask ventilation without difficulty Laryngoscope Size: Miller and 3 Grade View: Grade I Tube type: Oral Tube size: 8.0 mm Number of attempts: 1 Airway Equipment and Method: Stylet Placement Confirmation: ETT inserted through vocal cords under direct vision,  positive ETCO2 and breath sounds checked- equal and bilateral Secured at: 23 cm Tube secured with: Tape Dental Injury: Teeth and Oropharynx as per pre-operative assessment

## 2014-09-02 NOTE — Progress Notes (Signed)
Orthopedic Tech Progress Note Patient Details:  KASHEEM TONER 1956/03/23 [id number]  Patient ID: Vickey Sages, male   DOB: 1956-02-08, 58 y.o.   MRN: [id number] tnt technology to provide cpm because ortho currently has no cpm cover pads; viewed order from doctor's order list  Hildred Priest 09/02/2014, 11:48 AM

## 2014-09-02 NOTE — Op Note (Signed)
[name], [name]:  [id number]  MEDICAL RECORD NO.:  [id number]  LOCATION:  [id number]                        FACILITY:  [facility]  PHYSICIAN:  Anderson Malta, M.D.    DATE OF BIRTH:  [birth year]  DATE OF PROCEDURE:  09/02/2014 DATE OF DISCHARGE:                              OPERATIVE REPORT   PREOPERATIVE DIAGNOSIS:  Right knee arthritis.  POSTOPERATIVE DIAGNOSIS:  Right knee arthritis.  PROCEDURE:  Right total knee replacement.  SURGEON:  Anderson Malta, M.D.  ASSISTANT:  Laure Kidney, RNFA.  INDICATIONS:  [name] is a 58 year old patient with end-stage right knee arthritis presents for operative management after explanation of risks and benefits.  COMPONENTS UTILIZED:  Stryker Triathlon posterior cruciate retaining, 5 femur, 5 tibia, 11 poly, 32 patella.  TOURNIQUET TIME:  1 hour and 30 minutes approximately at 300 mmHg.  PROCEDURE IN DETAIL:  Patient was brought to the operating room, where general endotracheal anesthesia was induced.  Preoperative antibiotics administered.  Time-out was called.  Right leg was prescrubbed with alcohol and Betadine and allowed to air dry.  Prepped with DuraPrep solution and draped in sterile manner.  [name] was used to cover the operative field.  Leg was elevated and exsanguinated with Esmarch wrap and tourniquet inflated.  Anterior approach to the knee was made.  Skin and subcutaneous tissues were sharply divided.  Median parapatellar approach was made and marked with #1 Vicryl suture.  Lateral patellofemoral ligament was released.  Tissue removed off the anterior distal femur.  Medial dissection performed around the medial tibial plateau.  Fat pad partially excised.  Intramedullary alignment used on the tibia and a 9 mm cut made of the least affected lateral tibial plateau.  Intramedullary alignment, Flexi-Rod was used for the femur and an 8 mm cut was made.  This allowed placement in full extension with a  9 spacer.  The femur was sized to a size 5 and this was cut.  The posterior, anterior, and chamfer cuts were cut to a size 5 with good symmetric flexion gap maintained.  At this time, tibia was keel punched. The patella was cut freehand from 24 to 14 and a 32 three-PEG patella was placed with the trial 5 tibia and 9 poly insert.  Patient had slight hyperextension, good stability to varus valgus stress at 0, 30, and 90 degrees.  Posterior osteophytes were removed.  The lug nuts drilled in the femur.  The trial components were removed.  Thorough irrigation was performed.  Exparel then injected into the capsules and the proximal skin.  Thorough irrigation again performed and the components were then cemented into position.  I elected to use an 11 spacer which again gave a full extension, full flexion, but improved varus valgus stability after components were cemented, excess cement was removed.  At this time after cement hardening, an excess cement removal had occurred.  Knee was taken through a range of motion and found to have full range of motion. Flexion and extension with excellent patellar tracking with no-thumb technique and good stability varus valgus stress at 0, 30, and 90 degrees.  The tourniquet was released.  Bleeding points were encountered and  controlled with electrocautery.  Thorough irrigation again performed. Incision was then closed over bolster using #1 Vicryl suture, 0 Vicryl suture, 2-0 Vicryl suture and skin staples.  Solution of Marcaine, morphine, and clonidine injected into the knee.  Bulky dressing applied. Laure Kidney assistance required during the case.  The patient had good perfusion of the foot at the conclusion of the case.     Anderson Malta, M.D.     GSD/MEDQ  D:  09/02/2014  T:  09/02/2014  Job:  [id number]

## 2014-09-02 NOTE — Evaluation (Signed)
Physical Therapy Evaluation Patient Details Name: [name] MRN: [id number] DOB: 05/01/1956 Today's Date: 09/02/2014   History of Present Illness  58 y.o. male admitted to [facility] on 09/02/14 for elective R TKA.  Pt has significant PMHx of HTN, peripherial neuropathy, and DM.  Clinical Impression  Pt is POD #0 and is moving well supervision to min guard assist with RW and in room gait.  Pt will likely progress well enough to discharge home with HHPT and wife's supervision (she cannot physically assist him).   PT to follow acutely for deficits listed below.       Follow Up Recommendations Home health PT    Equipment Recommendations  None recommended by PT    Recommendations for Other Services   NA    Precautions / Restrictions Precautions Precautions: Knee Restrictions Weight Bearing Restrictions: Yes RLE Weight Bearing: Weight bearing as tolerated      Mobility  Bed Mobility Overal bed mobility: Modified Independent             General bed mobility comments: Pt able to move EOB unassisted.  HOB elevated.  Pt did not use railing.   Transfers Overall transfer level: Needs assistance Equipment used: Rolling walker (2 wheeled) Transfers: Sit to/from Stand Sit to Stand: Supervision         General transfer comment: supervision for safety  Ambulation/Gait Ambulation/Gait assistance: Min guard Ambulation Distance (Feet): 20 Feet Assistive device: Rolling walker (2 wheeled) Gait Pattern/deviations: Step-to pattern;Antalgic     General Gait Details: Pt quick to move with antalgic gait pattern.  He needs cues for safe RW use as he is quick to get outside of the boundaries of the walker.  Min guard assist for safety         Balance Overall balance assessment: Needs assistance Sitting-balance support: Feet supported;No upper extremity supported Sitting balance-Leahy Scale: Good     Standing balance support: Bilateral upper extremity supported;No upper extremity  supported;Single extremity supported Standing balance-Leahy Scale: Good                               Pertinent Vitals/Pain Pain Assessment: 0-10 Pain Score: 7  Pain Location: right knee Pain Descriptors / Indicators: Aching;Burning Pain Intervention(s): Limited activity within patient's tolerance;Monitored during session;Repositioned (RN in room to bring him pain meds)    Home Living Family/patient expects to be discharged to:: Private residence Living Arrangements: Spouse/significant other;Children Available Help at Discharge: Family Type of Home: House Home Access: Stairs to enter Entrance Stairs-Rails: Left Entrance Stairs-Number of Steps: 6 Home Layout: One level Home Equipment: Environmental consultant - 2 wheels;Cane - single point;Bedside commode;Wheelchair - manual;Shower seat      Prior Function Level of Independence: Independent               Hand Dominance   Dominant Hand: Left    Extremity/Trunk Assessment   Upper Extremity Assessment: Defer to OT evaluation           Lower Extremity Assessment: RLE deficits/detail RLE Deficits / Details: right leg with normal post op pain and weakness.  Pt with 3/5 ankle, 2+/5 knee, 3/5 hip flexion    Cervical / Trunk Assessment: Normal  Communication   Communication: No difficulties  Cognition Arousal/Alertness: Awake/alert Behavior During Therapy: WFL for tasks assessed/performed Overall Cognitive Status: Within Functional Limits for tasks assessed  Exercises Total Joint Exercises Ankle Circles/Pumps: AROM;Both;20 reps;Supine      Assessment/Plan    PT Assessment Patient needs continued PT services  PT Diagnosis Difficulty walking;Abnormality of gait;Generalized weakness;Acute pain   PT Problem List Decreased strength;Decreased range of motion;Decreased activity tolerance;Decreased balance;Decreased mobility;Decreased knowledge of use of DME;Decreased knowledge of  precautions;Pain  PT Treatment Interventions DME instruction;Gait training;Stair training;Therapeutic activities;Functional mobility training;Therapeutic exercise;Balance training;Neuromuscular re-education;Patient/family education;Manual techniques;Modalities   PT Goals (Current goals can be found in the Care Plan section) Acute Rehab PT Goals Patient Stated Goal: to go home as soon as he can PT Goal Formulation: With patient Time For Goal Achievement: 09/09/14 Potential to Achieve Goals: Good    Frequency 7X/week           End of Session Equipment Utilized During Treatment: Right knee immobilizer Activity Tolerance: Patient tolerated treatment well Patient left: in chair;with call bell/phone within reach;with family/visitor present           Time: 2778-2423 PT Time Calculation (min) (ACUTE ONLY): 18 min   Charges:   PT Evaluation $Initial PT Evaluation Tier I: 1 Procedure PT Treatments $Therapeutic Activity: 8-22 mins        Saul Fabiano B. Solange Emry, [place], DPT [phone]   09/02/2014, 4:44 PM

## 2014-09-02 NOTE — H&P (Signed)
TOTAL KNEE ADMISSION H&P  Patient is being admitted for right total knee arthroplasty.  Subjective:  Chief Complaint:right knee pain.  HPI: [name], 58 y.o. male, has a history of pain and functional disability in the right knee due to arthritis and has failed non-surgical conservative treatments for greater than 12 weeks to includeNSAID's and/or analgesics, corticosteriod injections, flexibility and strengthening excercises, use of assistive devices and activity modification.  Onset of symptoms was gradual, starting >10 years ago with gradually worsening course since that time. The patient noted prior procedures on the knee to include  arthroscopy and menisectomy on the right knee(s).  Patient currently rates pain in the right knee(s) at 8 out of 10 with activity. Patient has night pain, worsening of pain with activity and weight bearing, pain that interferes with activities of daily living, pain with passive range of motion, crepitus and joint swelling.  Patient has evidence of subchondral cysts, subchondral sclerosis, periarticular osteophytes and joint space narrowing by imaging studies. This patient has had worsening pain over last  2 years. There is no active infection.  Patient Active Problem List   Diagnosis Date Noted  . 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 Nephrolithiasis 02/01/2014  . Pneumonia 08/27/2011  . Diabetes mellitus 08/27/2011   Past Medical History  Diagnosis Date  . Kidney stone   . Hypertension   . Arthritis   . Peripheral neuropathy     left fifth finger, left toe  . Pneumonia     hx of  . Diabetes mellitus     fasting 120s    Past Surgical History  Procedure Laterality Date  . Lithotripsy    . Uretral stent    . Cystoscopy with retrograde pyelogram, ureteroscopy and stent placement Bilateral 02/01/2014    Procedure: CYSTOSCOPY WITH BILATERAL RETROGRADE CYSTOSCOPY, BILATERAL RETROGRADE, PYELOGRAM, RIGHT URETEROSCOPY, STONE BASKETRY,  AND BILATERAL STENT  PLACEMENT;  Surgeon: Alexis Frock, MD;  Location: [facility];  Service: Urology;  Laterality: Bilateral;  . Appendectomy    . Nephrolithotomy Left 03/17/2014    Procedure: NEPHROLITHOTOMY PERCUTANEOUS with access;  Surgeon: Alexis Frock, MD;  Location: [facility];  Service: Urology;  Laterality: Left;  . Cystoscopy w/ ureteral stent placement Bilateral 03/17/2014    Procedure: CYSTOSCOPY WITH RIGHT STENT PULL  ;  Surgeon: Alexis Frock, MD;  Location: [facility];  Service: Urology;  Laterality: Bilateral;  . Nephrolithotomy Left 03/19/2014    Procedure: 2ND STAGE NEPHROLITHOTOMY PERCUTANEOUS left ureteroscopy with lasr stone treatment and stent placement, with access;  Surgeon: Alexis Frock, MD;  Location: [facility];  Service: Urology;  Laterality: Left;  . Holmium laser application Left 10/01/2692    Procedure: HOLMIUM LASER APPLICATION;  Surgeon: Alexis Frock, MD;  Location: [facility];  Service: Urology;  Laterality: Left;    Prescriptions prior to admission  Medication Sig Dispense Refill Last Dose  . amLODipine (NORVASC) 2.5 MG tablet Take 2.5 mg by mouth every morning.    09/02/2014 at 0430  . Ascorbic Acid (VITAMIN C) 1000 MG tablet Take 1,000 mg by mouth daily.   Past Week at Unknown time  . aspirin EC 81 MG tablet Take 162 mg by mouth every morning.   Past Week at Unknown time  . atorvastatin (LIPITOR) 10 MG tablet Take 10 mg by mouth every evening.   Past Week at Unknown time  . cholecalciferol (VITAMIN D) 1000 UNITS tablet Take 1,000 Units by mouth daily.   Past Week at Unknown time  . Cyanocobalamin (VITAMIN B-12) 2500  MCG SUBL Place 1 tablet under the tongue daily.   Past Week at Unknown time  . gabapentin (NEURONTIN) 100 MG capsule Take 300 mg by mouth 3 (three) times daily.    09/02/2014 at 0430  . Ginkgo Biloba 120 MG CAPS Take 1 capsule by mouth daily.   Past Week at Unknown time  . HYDROmorphone (DILAUDID) 2 MG tablet Take 1 tablet (2 mg total) by mouth every 4 (four) hours as needed for  moderate pain or severe pain. Post-operatively 30 tablet 0 Past Month at Unknown time  . insulin glargine (LANTUS) 100 UNIT/ML injection Inject 20 Units into the skin at bedtime.   09/01/2014 at Unknown time  . lisinopril (PRINIVIL,ZESTRIL) 40 MG tablet Take 40 mg by mouth every morning.    Past Week at Unknown time  . meloxicam (MOBIC) 15 MG tablet Take 15 mg by mouth daily.   Past Week at Unknown time  . metFORMIN (GLUCOPHAGE) 500 MG tablet Take 1,000 mg by mouth 2 (two) times daily with a meal.    09/01/2014 at Unknown time  . Multiple Vitamins-Minerals (MULTIVITAMINS THER. W/MINERALS) TABS Take 1 tablet by mouth every morning.    Past Week at Unknown time  . Omega 3 1200 MG CAPS Take 1,200 mg by mouth daily.   Past Week at Unknown time  . oxybutynin (DITROPAN) 5 MG tablet Take 1 tablet (5 mg total) by mouth every 8 (eight) hours as needed for bladder spasms. / stent discomfort 30 tablet 1 Past Month at Unknown time  . pioglitazone (ACTOS) 15 MG tablet Take 15 mg by mouth daily.   Past Week at Unknown time  . montelukast (SINGULAIR) 10 MG tablet Take 10 mg by mouth at bedtime.   More than a month at Unknown time  . senna-docusate (SENOKOT-S) 8.6-50 MG per tablet Take 1 tablet by mouth 2 (two) times daily. While taking pain meds to prevent constipation (Patient not taking: Reported on 08/25/2014) 30 tablet 0 Not Taking at Unknown time  . sulfamethoxazole-trimethoprim (BACTRIM DS) 800-160 MG per tablet Take 1 tablet by mouth 2 (two) times daily. X 3 days. Begin day prior to next Urology appointment. (Patient not taking: Reported on 08/25/2014) 6 tablet 0 Not Taking at Unknown time   No Known Allergies  History  Substance Use Topics  . Smoking status: Never Smoker   . Smokeless tobacco: Never Used  . Alcohol Use: 1.2 oz/week    2 Cans of beer per week     Comment: occasional/ 1-2 beers week    History reviewed. No pertinent family history.   ROS  Objective:  Physical Exam  Vital signs in  last 24 hours: Temp:  [98.5 F (36.9 C)] 98.5 F (36.9 C) (12/08 0554) Pulse Rate:  [79] 79 (12/08 0554) Resp:  [20] 20 (12/08 0554) BP: (151)/(77) 151/77 mmHg (12/08 0554) SpO2:  [97 %] 97 % (12/08 0554) Weight:  [92.023 kg (202 lb 14 oz)] 92.023 kg (202 lb 14 oz) (12/08 0554)  Labs:   Estimated body mass index is 29.95 kg/(m^2) as calculated from the following:   Height as of this encounter: 5\' 9"  (1.753 m).   Weight as of this encounter: 92.023 kg (202 lb 14 oz).   Imaging Review Plain radiographs demonstrate severe degenerative joint disease of the right knee(s). The overall alignment ismild varus. The bone quality appears to be good for age and reported activity level.  Assessment/Plan:  End stage arthritis, right knee   The patient history,  physical examination, clinical judgment of the provider and imaging studies are consistent with end stage degenerative joint disease of the right knee(s) and total knee arthroplasty is deemed medically necessary. The treatment options including medical management, injection therapy arthroscopy and arthroplasty were discussed at length. The risks and benefits of total knee arthroplasty were presented and reviewed. The risks due to aseptic loosening, infection, stiffness, patella tracking problems, thromboembolic complications and other imponderables were discussed. The patient acknowledged the explanation, agreed to proceed with the plan and consent was signed. Patient is being admitted for inpatient treatment for surgery, pain control, PT, OT, prophylactic antibiotics, VTE prophylaxis, progressive ambulation and ADL's and discharge planning. The patient is planning to be discharged home with home health services

## 2014-09-02 NOTE — Progress Notes (Signed)
Orthopedic Tech Progress Note Patient Details:  [name] [birth year] [id number]  Patient ID: [name], male   DOB: [birth year], 58 y.o.   MRN: [id number]  Footsie roll; trapeze bar patient helper Hildred Priest 09/02/2014, 11:59 AM

## 2014-09-02 NOTE — Anesthesia Postprocedure Evaluation (Signed)
Anesthesia Post Note  Patient: [name]  Procedure(s) Performed: Procedure(s) (LRB): RIGHT TOTAL KNEE ARTHROPLASTY (Right)  Anesthesia type: General  Patient location: PACU  Post pain: Pain level controlled and Adequate analgesia  Post assessment: Post-op Vital signs reviewed, Patient's Cardiovascular Status Stable, Respiratory Function Stable, Patent Airway and Pain level controlled  Last Vitals:  Filed Vitals:   09/02/14 1145  BP: 159/84  Pulse: 88  Temp:   Resp: 15    Post vital signs: Reviewed and stable  Level of consciousness: awake, alert  and oriented  Complications: No apparent anesthesia complications

## 2014-09-02 NOTE — Progress Notes (Signed)
Lunch relief by S. Gregson RN 

## 2014-09-02 NOTE — Anesthesia Preprocedure Evaluation (Signed)
Anesthesia Evaluation  Patient identified by MRN, date of birth, ID band Patient awake    Reviewed: Allergy & Precautions, H&P , NPO status , Patient's Chart, lab work & pertinent test results  Airway Mallampati: II   Neck ROM: full    Dental   Pulmonary          Cardiovascular hypertension,     Neuro/Psych  Neuromuscular disease    GI/Hepatic   Endo/Other  diabetes, Type 2  Renal/GU      Musculoskeletal  (+) Arthritis -,   Abdominal   Peds  Hematology   Anesthesia Other Findings   Reproductive/Obstetrics                             Anesthesia Physical Anesthesia Plan  ASA: II  Anesthesia Plan: General and Regional   Post-op Pain Management: MAC Combined w/ Regional for Post-op pain   Induction: Intravenous  Airway Management Planned: Oral ETT  Additional Equipment:   Intra-op Plan:   Post-operative Plan: Extubation in OR  Informed Consent: I have reviewed the patients History and Physical, chart, labs and discussed the procedure including the risks, benefits and alternatives for the proposed anesthesia with the patient or authorized representative who has indicated his/her understanding and acceptance.     Plan Discussed with: CRNA, Anesthesiologist and Surgeon  Anesthesia Plan Comments:         Anesthesia Quick Evaluation

## 2014-09-02 NOTE — Progress Notes (Signed)
Utilization Review Completed.Mackenna Kamer T12/04/2014  

## 2014-09-02 NOTE — Progress Notes (Signed)
Ear ring noted to left ear. Pt states that it will not come out.  Dr Marcie Bal informed.

## 2014-09-02 NOTE — Progress Notes (Addendum)
ANTICOAGULATION CONSULT NOTE - Initial Consult  Pharmacy Consult for Warfarin Indication: VTE prophylaxis  No Known Allergies  Patient Measurements: Height: 5\' 9"  (175.3 cm) Weight: 202 lb 14 oz (92.023 kg) IBW/kg (Calculated) : 70.7  Vital Signs: Temp: 97.6 F (36.4 C) (12/08 1417) Temp Source: Oral (12/08 0554) BP: 151/78 mmHg (12/08 1345) Pulse Rate: 107 (12/08 1417)  Labs: No results for input(s): HGB, HCT, PLT, APTT, LABPROT, INR, HEPARINUNFRC, CREATININE, CKTOTAL, CKMB, TROPONINI in the last 72 hours.  Estimated Creatinine Clearance: 114.1 mL/min (by C-G formula based on Cr of 0.72).   Medical History: Past Medical History  Diagnosis Date  . Kidney stone   . Hypertension   . Arthritis   . Peripheral neuropathy     left fifth finger, left toe  . Pneumonia     hx of  . Diabetes mellitus     fasting 120s    Medications:  Prescriptions prior to admission  Medication Sig Dispense Refill Last Dose  . amLODipine (NORVASC) 2.5 MG tablet Take 2.5 mg by mouth every morning.    09/02/2014 at 0430  . Ascorbic Acid (VITAMIN C) 1000 MG tablet Take 1,000 mg by mouth daily.   Past Week at Unknown time  . aspirin EC 81 MG tablet Take 162 mg by mouth every morning.   Past Week at Unknown time  . atorvastatin (LIPITOR) 10 MG tablet Take 10 mg by mouth every evening.   Past Week at Unknown time  . cholecalciferol (VITAMIN D) 1000 UNITS tablet Take 1,000 Units by mouth daily.   Past Week at Unknown time  . Cyanocobalamin (VITAMIN B-12) 2500 MCG SUBL Place 1 tablet under the tongue daily.   Past Week at Unknown time  . gabapentin (NEURONTIN) 100 MG capsule Take 300 mg by mouth 3 (three) times daily.    09/02/2014 at 0430  . Ginkgo Biloba 120 MG CAPS Take 1 capsule by mouth daily.   Past Week at Unknown time  . HYDROmorphone (DILAUDID) 2 MG tablet Take 1 tablet (2 mg total) by mouth every 4 (four) hours as needed for moderate pain or severe pain. Post-operatively 30 tablet 0 Past Month  at Unknown time  . insulin glargine (LANTUS) 100 UNIT/ML injection Inject 20 Units into the skin at bedtime.   09/01/2014 at Unknown time  . lisinopril (PRINIVIL,ZESTRIL) 40 MG tablet Take 40 mg by mouth every morning.    Past Week at Unknown time  . meloxicam (MOBIC) 15 MG tablet Take 15 mg by mouth daily.   Past Week at Unknown time  . metFORMIN (GLUCOPHAGE) 500 MG tablet Take 1,000 mg by mouth 2 (two) times daily with a meal.    09/01/2014 at Unknown time  . Multiple Vitamins-Minerals (MULTIVITAMINS THER. W/MINERALS) TABS Take 1 tablet by mouth every morning.    Past Week at Unknown time  . Omega 3 1200 MG CAPS Take 1,200 mg by mouth daily.   Past Week at Unknown time  . oxybutynin (DITROPAN) 5 MG tablet Take 1 tablet (5 mg total) by mouth every 8 (eight) hours as needed for bladder spasms. / stent discomfort 30 tablet 1 Past Month at Unknown time  . pioglitazone (ACTOS) 15 MG tablet Take 15 mg by mouth daily.   Past Week at Unknown time  . montelukast (SINGULAIR) 10 MG tablet Take 10 mg by mouth at bedtime.   More than a month at Unknown time  . senna-docusate (SENOKOT-S) 8.6-50 MG per tablet Take 1 tablet by mouth 2 (  two) times daily. While taking pain meds to prevent constipation (Patient not taking: Reported on 08/25/2014) 30 tablet 0 Not Taking at Unknown time  . sulfamethoxazole-trimethoprim (BACTRIM DS) 800-160 MG per tablet Take 1 tablet by mouth 2 (two) times daily. X 3 days. Begin day prior to next Urology appointment. (Patient not taking: Reported on 08/25/2014) 6 tablet 0 Not Taking at Unknown time   Scheduled:  . [START ON 09/03/2014] amLODipine  2.5 mg Oral q morning - 10a  . [START ON 09/03/2014] aspirin EC  162 mg Oral q morning - 10a  . atorvastatin  10 mg Oral QPM  .  ceFAZolin (ANCEF) IV  1 g Intravenous 3 times per day  . cholecalciferol  1,000 Units Oral Daily  . vitamin B-12  2,500 mcg Oral Daily  . gabapentin  300 mg Oral TID  . HYDROmorphone      . HYDROmorphone      .  insulin aspart  0-15 Units Subcutaneous TID WC  . insulin glargine  20 Units Subcutaneous QHS  . [START ON 09/03/2014] lisinopril  40 mg Oral q morning - 10a  . metFORMIN  1,000 mg Oral BID WC  . methocarbamol      . montelukast  10 mg Oral QHS  . multivitamin with minerals  1 tablet Oral Daily  . oxyCODONE      . pioglitazone  15 mg Oral Daily  . vitamin C  1,000 mg Oral Daily    Assessment: 58yo male presents for R total knee arthroplasty. Pharmacy is consulted to dose warfarin for VTE prophylaxis following orthopedic procedure. Baseline INR is 1.01, Hgb 14.5, Plt 292.   Goal of Therapy:  INR 2-3 Monitor platelets by anticoagulation protocol: Yes   Plan:  Warfarin 7.5mg  PO tonight x1 Daily INR Continue to monitor H&H and platelets  Monitor s/sx of bleeding Complete warfarin education  Andrey Cota. Diona Foley, PharmD Clinical Pharmacist Pager [phone] 09/02/2014,3:39 PM

## 2014-09-02 NOTE — Brief Op Note (Signed)
09/02/2014  11:04 AM  PATIENT:  [name]  58 y.o. male  PRE-OPERATIVE DIAGNOSIS:  Right knee osteoarthritis  POST-OPERATIVE DIAGNOSIS:  Right knee osteoarthritis  PROCEDURE:  Procedure(s): RIGHT TOTAL KNEE ARTHROPLASTY  SURGEON:  Surgeon(s): Meredith Pel, MD  ASSISTANT: carla bethune rnfa  ANESTHESIA:   general  EBL:168ml    Total I/O In: 1700 [I.V.:1700] Out: -   BLOOD ADMINISTERED: none  DRAINS: none   LOCAL MEDICATIONS USED:  exparel mso4 marcaine clonidine  SPECIMEN:  No Specimen  COUNTS:  YES  TOURNIQUET:   Total Tourniquet Time Documented: Thigh (Right) - 93 minutes Total: Thigh (Right) - 93 minutes   DICTATION: .Other Dictation: Dictation Number [id number]  PLAN OF CARE: Admit to inpatient   PATIENT DISPOSITION:  PACU - hemodynamically stable

## 2014-09-03 ENCOUNTER — Encounter (HOSPITAL_COMMUNITY): Payer: Self-pay | Admitting: Orthopedic Surgery

## 2014-09-03 LAB — GLUCOSE, CAPILLARY
GLUCOSE-CAPILLARY: 136 mg/dL — AB (ref 70–99)
GLUCOSE-CAPILLARY: 140 mg/dL — AB (ref 70–99)
GLUCOSE-CAPILLARY: 141 mg/dL — AB (ref 70–99)
Glucose-Capillary: 157 mg/dL — ABNORMAL HIGH (ref 70–99)

## 2014-09-03 LAB — BASIC METABOLIC PANEL
Anion gap: 13 (ref 5–15)
BUN: 15 mg/dL (ref 6–23)
CHLORIDE: 101 meq/L (ref 96–112)
CO2: 27 meq/L (ref 19–32)
Calcium: 9.3 mg/dL (ref 8.4–10.5)
Creatinine, Ser: 0.78 mg/dL (ref 0.50–1.35)
GFR calc non Af Amer: >90 mL/min (ref >90)
Glucose, Bld: 145 mg/dL — ABNORMAL HIGH (ref 70–99)
POTASSIUM: 4.4 meq/L (ref 3.7–5.3)
Sodium: 141 mEq/L (ref 137–147)

## 2014-09-03 LAB — PROTIME-INR
INR: 1.07 (ref 0.00–1.49)
Prothrombin Time: 14 seconds (ref 11.6–15.2)

## 2014-09-03 LAB — CBC
HEMATOCRIT: 36.3 % — AB (ref 39.0–52.0)
HEMOGLOBIN: 11.9 g/dL — AB (ref 13.0–17.0)
MCH: 30.5 pg (ref 26.0–34.0)
MCHC: 32.8 g/dL (ref 30.0–36.0)
MCV: 93.1 fL (ref 78.0–100.0)
Platelets: 266 10*3/uL (ref 150–400)
RBC: 3.9 MIL/uL — AB (ref 4.22–5.81)
RDW: 13.1 % (ref 11.5–15.5)
WBC: 14.6 10*3/uL — ABNORMAL HIGH (ref 4.0–10.5)

## 2014-09-03 MED ORDER — WARFARIN SODIUM 10 MG PO TABS
10.0000 mg | ORAL_TABLET | Freq: Once | ORAL | Status: AC
  Administered 2014-09-03: 10 mg via ORAL
  Filled 2014-09-03: qty 1

## 2014-09-03 MED ORDER — POLYETHYLENE GLYCOL 3350 17 G PO PACK
17.0000 g | PACK | Freq: Every day | ORAL | Status: DC | PRN
  Administered 2014-09-04: 17 g via ORAL
  Filled 2014-09-03: qty 1

## 2014-09-03 MED ORDER — DOCUSATE SODIUM 100 MG PO CAPS
100.0000 mg | ORAL_CAPSULE | Freq: Two times a day (BID) | ORAL | Status: DC
  Administered 2014-09-03 – 2014-09-04 (×3): 100 mg via ORAL
  Filled 2014-09-03 (×2): qty 1

## 2014-09-03 NOTE — Plan of Care (Signed)
Problem: Consults Goal: Skin Care Protocol Initiated - if Braden Score 18 or less If consults are not indicated, leave blank or document N/A  Outcome: Not Applicable Date Met:  91/91/66 Goal: Nutrition Consult-if indicated Outcome: Not Applicable Date Met:  06/00/45  Problem: Phase I Progression Outcomes Goal: Pain controlled with appropriate interventions Outcome: Completed/Met Date Met:  09/03/14 Goal: Initial discharge plan identified Outcome: Completed/Met Date Met:  09/03/14 Goal: Hemodynamically stable Outcome: Completed/Met Date Met:  09/03/14 Goal: Other Phase I Outcomes/Goals Outcome: Completed/Met Date Met:  09/03/14  Problem: Phase II Progression Outcomes Goal: Discharge plan established Outcome: Completed/Met Date Met:  09/03/14 Goal: Other Phase II Outcomes/Goals Outcome: Completed/Met Date Met:  09/03/14  Problem: Phase III Progression Outcomes Goal: Pain controlled on oral analgesia Outcome: Completed/Met Date Met:  09/03/14 Goal: Ambulates Outcome: Adequate for Discharge Goal: Incision clean - minimal/no drainage Outcome: Completed/Met Date Met:  09/03/14 Goal: Discharge plan remains appropriate-arrangements made Outcome: Completed/Met Date Met:  09/03/14 Goal: Anticoagulant follow-up in place Outcome: Adequate for Discharge Goal: Other Phase III Outcomes/Goals Outcome: Completed/Met Date Met:  09/03/14  Problem: Discharge Progression Outcomes Goal: Barriers To Progression Addressed/Resolved Outcome: Completed/Met Date Met:  09/03/14 Goal: CMS/Neurovascular status at or above baseline Outcome: Completed/Met Date Met:  09/03/14 Goal: Anticoagulant follow-up in place Outcome: Adequate for Discharge Goal: Pain controlled with appropriate interventions Outcome: Completed/Met Date Met:  09/03/14 Goal: Hemodynamically stable Outcome: Completed/Met Date Met:  09/03/14 Goal: Tolerates diet Outcome: Completed/Met Date Met:  09/03/14 Goal: Ambulates safely  using assistive device Outcome: Completed/Met Date Met:  09/03/14 Goal: Follows weight - bearing limitations Outcome: Completed/Met Date Met:  09/03/14 Goal: Discharge plan in place and appropriate Outcome: Completed/Met Date Met:  09/03/14

## 2014-09-03 NOTE — Plan of Care (Signed)
Problem: Consults Goal: Total Joint Replacement Patient Education See Patient Education Module for education specifics. Outcome: Completed/Met Date Met:  09/03/14 Goal: Diagnosis- Total Joint Replacement Outcome: Completed/Met Date Met:  09/03/14 Primary Total Knee Right Goal: Diabetes Guidelines if Diabetic/Glucose > 140 If diabetic or lab glucose is > 140 mg/dl - Initiate Diabetes/Hyperglycemia Guidelines & Document Interventions  Outcome: Completed/Met Date Met:  09/03/14  Problem: Phase I Progression Outcomes Goal: CMS/Neurovascular status WDL Outcome: Completed/Met Date Met:  09/03/14 Goal: Dangle or out of bed evening of surgery Outcome: Completed/Met Date Met:  09/03/14  Problem: Phase II Progression Outcomes Goal: Ambulates Outcome: Completed/Met Date Met:  09/03/14 Goal: Tolerating diet Outcome: Completed/Met Date Met:  09/03/14

## 2014-09-03 NOTE — Progress Notes (Signed)
Physical Therapy Treatment Patient Details Name: [name] MRN: [id number] DOB: [birth year] Today's Date: 09/03/2014    History of Present Illness 58 y.o. male admitted to [facility] on 09/02/14 for elective R TKA.  Pt has significant PMHx of HTN, peripherial neuropathy, and DM.    PT Comments    Pt progressing well with mobility. Reports difficulty managing pain and will be planned for D/C tomorrow. Will cont to follow per POC.   Follow Up Recommendations  Home health PT     Equipment Recommendations  None recommended by PT    Recommendations for Other Services       Precautions / Restrictions Precautions Precautions: Knee Precaution Comments: reviewed no pillow under knee  Required Braces or Orthoses: Knee Immobilizer - Right Knee Immobilizer - Right:  (pt able to perform SLR ) Restrictions Weight Bearing Restrictions: Yes RLE Weight Bearing: Weight bearing as tolerated    Mobility  Bed Mobility               General bed mobility comments: pt up in chair and returned to chair   Transfers Overall transfer level: Needs assistance Equipment used: Rolling walker (2 wheeled) Transfers: Sit to/from Stand Sit to Stand: Supervision         General transfer comment: no sway noted with sit to stand; min cues for hand placement   Ambulation/Gait Ambulation/Gait assistance: Supervision Ambulation Distance (Feet): 125 Feet Assistive device: Rolling walker (2 wheeled) Gait Pattern/deviations: Step-through pattern;Decreased stance time - right;Decreased step length - left;Antalgic Gait velocity: decreased Gait velocity interpretation: Below normal speed for age/gender General Gait Details: initially ambulating at step to gt, with cues pt able to progress to step through gt; c/o 6/10 pain   Stairs Stairs: Yes Stairs assistance: Min guard Stair Management: One rail Left;Step to pattern;Forwards Number of Stairs: 3 General stair comments: cues for technique and hand  held (A) to balance   Wheelchair Mobility    Modified Rankin (Stroke Patients Only)       Balance Overall balance assessment: Needs assistance Sitting-balance support: Feet supported;No upper extremity supported Sitting balance-Leahy Scale: Good     Standing balance support: During functional activity;Bilateral upper extremity supported Standing balance-Leahy Scale: Good                      Cognition Arousal/Alertness: Awake/alert Behavior During Therapy: WFL for tasks assessed/performed Overall Cognitive Status: Within Functional Limits for tasks assessed                      Exercises Total Joint Exercises Ankle Circles/Pumps: AROM;Both;20 reps;Supine Quad Sets: AROM;Right;10 reps;Seated Heel Slides: AAROM;Right;10 reps;Seated;Other (comment) (with sheet) Hip ABduction/ADduction: AROM;Right;Strengthening;10 reps;Seated Long Arc Quad: AROM;Strengthening;Right;10 reps;Seated Goniometric ROM: AROM -8 to 70 degrees in sitting    General Comments General comments (skin integrity, edema, etc.): pt given exercise handout      Pertinent Vitals/Pain Pain Assessment: 0-10 Pain Score: 6  Pain Location: Rt knee  Pain Descriptors / Indicators: Sore;Aching Pain Intervention(s): Monitored during session;Premedicated before session;Repositioned;Ice applied    Home Living                      Prior Function            PT Goals (current goals can now be found in the care plan section) Acute Rehab PT Goals Patient Stated Goal: to go home tomorrow PT Goal Formulation: With patient Time For Goal Achievement: 09/09/14 Potential to Achieve  Goals: Good Progress towards PT goals: Progressing toward goals    Frequency  7X/week    PT Plan Current plan remains appropriate    Co-evaluation             End of Session Equipment Utilized During Treatment: Gait belt Activity Tolerance: Patient tolerated treatment well Patient left: in chair;with  call bell/phone within reach;with family/visitor present     Time: 0800-0825 PT Time Calculation (min) (ACUTE ONLY): 25 min  Charges:  $Gait Training: 8-22 mins $Therapeutic Exercise: 8-22 mins                    G CodesGustavus Bryant, [place]  [phone] 09/03/2014, 8:35 AM

## 2014-09-03 NOTE — Evaluation (Signed)
Occupational Therapy Evaluation Patient Details Name: [name] MRN: [id number] DOB: 07-30-1956 Today's Date: 09/03/2014    History of Present Illness 58 y.o. male admitted to [facility] on 09/02/14 for elective R TKA.  Pt has significant PMHx of HTN, peripherial neuropathy, and DM.   Clinical Impression   Pt performing ADL at a supervision level.  Instructed in safety, dressing techniques and safe footwear.  No further OT needs.  DME needs at home are met.    Follow Up Recommendations  No OT follow up    Equipment Recommendations  None recommended by OT    Recommendations for Other Services       Precautions / Restrictions Precautions Precautions: Knee Precaution Comments: reviewed no pillow under knee  Required Braces or Orthoses: Knee Immobilizer - Right Knee Immobilizer - Right: Discontinue once straight leg raise with < 10 degree lag Restrictions Weight Bearing Restrictions: Yes RLE Weight Bearing: Weight bearing as tolerated      Mobility Bed Mobility Overal bed mobility: Modified Independent             General bed mobility comments: pt up in chair and returned to chair   Transfers Overall transfer level: Needs assistance Equipment used: Rolling walker (2 wheeled) Transfers: Sit to/from Stand Sit to Stand: Supervision         General transfer comment: no sway noted with sit to stand; min cues for hand placement     Balance Overall balance assessment: Needs assistance Sitting-balance support: Feet supported;No upper extremity supported Sitting balance-Leahy Scale: Good     Standing balance support: During functional activity;Bilateral upper extremity supported Standing balance-Leahy Scale: Good                              ADL                                         General ADL Comments: Pt performing ADL at a supervision level.  Able to access his R foot for bathing and dressing. Instructed to dress R LE first.      Vision                     Perception     Praxis      Pertinent Vitals/Pain Pain Assessment: 0-10 Pain Score: 7  Pain Location: R knee Pain Descriptors / Indicators: Aching;Sore Pain Intervention(s): Limited activity within patient's tolerance;Monitored during session;Repositioned;Premedicated before session;Ice applied     Hand Dominance Left   Extremity/Trunk Assessment Upper Extremity Assessment Upper Extremity Assessment: Overall WFL for tasks assessed   Lower Extremity Assessment Lower Extremity Assessment: Defer to PT evaluation   Cervical / Trunk Assessment Cervical / Trunk Assessment: Normal   Communication Communication Communication: No difficulties   Cognition Arousal/Alertness: Awake/alert Behavior During Therapy: WFL for tasks assessed/performed Overall Cognitive Status: Within Functional Limits for tasks assessed                     General Comments       Exercises Exercises: Total Joint     Shoulder Instructions      Home Living Family/patient expects to be discharged to:: Private residence Living Arrangements: Spouse/significant other;Children Available Help at Discharge: Family;Available 24 hours/day Type of Home: House Home Access: Stairs to enter [employer] of Steps: 6 Entrance Stairs-Rails: Left  Home Layout: One level     Bathroom Shower/Tub: Occupational psychologist: Standard     Home Equipment: Environmental consultant - 2 wheels;Cane - single point;Bedside commode;Wheelchair - manual;Shower seat          Prior Functioning/Environment Level of Independence: Independent             OT Diagnosis:     OT Problem List:     OT Treatment/Interventions:      OT Goals(Current goals can be found in the care plan section) Acute Rehab OT Goals Patient Stated Goal: to go home tomorrow  OT Frequency:     Barriers to D/C:            Co-evaluation              End of Session    Activity  Tolerance: Patient tolerated treatment well Patient left: in bed;with call bell/phone within reach   Time: 1030-1048 OT Time Calculation (min): 18 min Charges:  OT General Charges $OT Visit: 1 Procedure OT Evaluation $Initial OT Evaluation Tier I: 1 Procedure G-Codes:    Malka So 09/03/2014, 10:49 AM  [phone]

## 2014-09-03 NOTE — Progress Notes (Signed)
Subjective: Pt stable - pain controlled - has been oob   Objective: Vital signs in last 24 hours: Temp:  [97.6 F (36.4 C)-98.9 F (37.2 C)] 98.9 F (37.2 C) (12/09 0601) Pulse Rate:  [86-107] 91 (12/09 0601) Resp:  [5-21] 17 (12/09 0601) BP: (114-165)/(66-87) 114/66 mmHg (12/09 0601) SpO2:  [89 %-99 %] 95 % (12/09 0601)  Intake/Output from previous day: 12/08 0701 - 12/09 0700 In: 2780 [P.O.:700; I.V.:1980; IV Piggyback:100] Out: 150 [Blood:150] Intake/Output this shift:    Exam:  Neurovascular intact Sensation intact distally Intact pulses distally  Labs:  Recent Labs  09/03/14 0407  HGB 11.9*    Recent Labs  09/03/14 0407  WBC 14.6*  RBC 3.90*  HCT 36.3*  PLT 266    Recent Labs  09/03/14 0407  NA 141  K 4.4  CL 101  CO2 27  BUN 15  CREATININE 0.78  GLUCOSE 145*  CALCIUM 9.3    Recent Labs  09/03/14 0407  INR 1.07    Assessment/Plan: Plan PT and CPM today - possible dc am   DEAN,GREGORY SCOTT 09/03/2014, 7:38 AM

## 2014-09-03 NOTE — Progress Notes (Signed)
Physical Therapy Treatment Patient Details Name: [name] MRN: [id number] DOB: 06-08-1956 Today's Date: 09/03/2014    History of Present Illness 58 y.o. male admitted to [facility] on 09/02/14 for elective R TKA.  Pt has significant PMHx of HTN, peripherial neuropathy, and DM.    PT Comments    Pt progressing well with mobility. Able to ambulate the unit at supervision level. Pt hopeful to D/C home tomorrow if pain controlled.   Follow Up Recommendations  Home health PT     Equipment Recommendations  None recommended by PT    Recommendations for Other Services       Precautions / Restrictions Precautions Precautions: Knee Precaution Comments: reviewed no pillow under knee  Restrictions Weight Bearing Restrictions: Yes RLE Weight Bearing: Weight bearing as tolerated    Mobility  Bed Mobility Overal bed mobility: Modified Independent             General bed mobility comments: pt able to advance Rt LE into bed and lift for CPM placement   Transfers Overall transfer level: Needs assistance Equipment used: Rolling walker (2 wheeled) Transfers: Sit to/from Stand Sit to Stand: Supervision         General transfer comment: cues for reaching back to control sitting to EOB  Ambulation/Gait Ambulation/Gait assistance: Supervision Ambulation Distance (Feet): 400 Feet Assistive device: Rolling walker (2 wheeled) Gait Pattern/deviations: Antalgic;Trunk flexed Gait velocity: decreased Gait velocity interpretation: Below normal speed for age/gender General Gait Details: pt progressing well with step through gt; cues for upright posture and to relax shoulders with gt   Stairs Stairs: Yes Stairs assistance: Supervision Stair Management: One rail Left;With cane;Step to pattern;Forwards Number of Stairs: 5 General stair comments: supervision for safety and cues for technique with use of cane in Rt UE to simulate home enviroment   Wheelchair Mobility    Modified  Rankin (Stroke Patients Only)       Balance Overall balance assessment: No apparent balance deficits (not formally assessed)                                  Cognition Arousal/Alertness: Awake/alert Behavior During Therapy: WFL for tasks assessed/performed Overall Cognitive Status: Within Functional Limits for tasks assessed                      Exercises      General Comments General comments (skin integrity, edema, etc.): pt denied any questions with HEP; pt able to perform bathroom ADLs at supervision level      Pertinent Vitals/Pain Pain Assessment: 0-10 Pain Score: 8  Pain Location: initially pain 8/10; with ambulation 6/10 Pain Descriptors / Indicators: Tightness;Sore Pain Intervention(s): Monitored during session;Premedicated before session;Repositioned    Home Living                      Prior Function            PT Goals (current goals can now be found in the care plan section) Acute Rehab PT Goals Patient Stated Goal: to go home tomorrow when pain is controlled  PT Goal Formulation: With patient Time For Goal Achievement: 09/09/14 Potential to Achieve Goals: Good Progress towards PT goals: Progressing toward goals    Frequency  7X/week    PT Plan Current plan remains appropriate    Co-evaluation             End of Session  Activity Tolerance: Patient tolerated treatment well Patient left: in bed;in CPM;with call bell/phone within reach     Time: 1411-1430 PT Time Calculation (min) (ACUTE ONLY): 19 min  Charges:  $Gait Training: 8-22 mins                    G CodesElie Confer [place], [place] 09/03/2014, 4:28 PM

## 2014-09-03 NOTE — Progress Notes (Signed)
ANTICOAGULATION CONSULT NOTE  Pharmacy Consult for Warfarin Indication: VTE prophylaxis  No Known Allergies  Patient Measurements: Height: 5\' 9"  (175.3 cm) Weight: 202 lb 14 oz (92.023 kg) IBW/kg (Calculated) : 70.7  Vital Signs: Temp: 98.9 F (37.2 C) (12/09 0601) Temp Source: Oral (12/09 0601) BP: 114/66 mmHg (12/09 0601) Pulse Rate: 91 (12/09 0601)  Labs:  Recent Labs  09/03/14 0407  HGB 11.9*  HCT 36.3*  PLT 266  LABPROT 14.0  INR 1.07  CREATININE 0.78    Estimated Creatinine Clearance: 114.1 mL/min (by C-G formula based on Cr of 0.78).  Assessment: 58yo male s/p R total knee arthroplasty 12/8. Pharmacy is consulted to dose warfarin for VTE prophylaxis following orthopedic procedure. Some ABLA post-procedure. INR this morning 1.07. No overt bleeding noted.  Goal of Therapy:  INR 2-3 Monitor platelets by anticoagulation protocol: Yes   Plan:  -Warfarin 10mg  PO tonight x1 -daily PT/INR -Continue to monitor H&H and platelets  -Monitor s/sx of bleeding -Complete warfarin education  Eyob Godlewski D. Bettymae Yott, PharmD, BCPS Clinical Pharmacist Pager: [phone] 09/03/2014 9:37 AM

## 2014-09-04 LAB — CBC
HCT: 34.2 % — ABNORMAL LOW (ref 39.0–52.0)
Hemoglobin: 11.3 g/dL — ABNORMAL LOW (ref 13.0–17.0)
MCH: 31.6 pg (ref 26.0–34.0)
MCHC: 33 g/dL (ref 30.0–36.0)
MCV: 95.5 fL (ref 78.0–100.0)
PLATELETS: 220 10*3/uL (ref 150–400)
RBC: 3.58 MIL/uL — ABNORMAL LOW (ref 4.22–5.81)
RDW: 13.1 % (ref 11.5–15.5)
WBC: 12.5 10*3/uL — ABNORMAL HIGH (ref 4.0–10.5)

## 2014-09-04 LAB — PROTIME-INR
INR: 2.1 — ABNORMAL HIGH (ref 0.00–1.49)
Prothrombin Time: 23.8 seconds — ABNORMAL HIGH (ref 11.6–15.2)

## 2014-09-04 LAB — GLUCOSE, CAPILLARY: Glucose-Capillary: 173 mg/dL — ABNORMAL HIGH (ref 70–99)

## 2014-09-04 MED ORDER — METHOCARBAMOL 500 MG PO TABS: 500.0000 mg | ORAL_TABLET | Freq: Four times a day (QID) | ORAL | Status: DC | PRN

## 2014-09-04 MED ORDER — HYDROMORPHONE HCL 2 MG PO TABS: 2.0000 mg | ORAL_TABLET | ORAL | Status: DC | PRN

## 2014-09-04 MED ORDER — WARFARIN SODIUM 5 MG PO TABS: 5.0000 mg | ORAL_TABLET | Freq: Every day | ORAL | Status: DC

## 2014-09-04 NOTE — Plan of Care (Signed)
Problem: Phase III Progression Outcomes Goal: Anticoagulant follow-up in place Outcome: Completed/Met Date Met:  09/04/14

## 2014-09-04 NOTE — Progress Notes (Signed)
Rept to Dr. Ninfa Linden. Pt's temp has risen to 102.3 after tylenol and IS. Pt's WBC today 14.0. As per MD will continue to administer tylenol as needed per MD order and push IS. Will continue to monitor

## 2014-09-04 NOTE — Progress Notes (Signed)
Physical Therapy Treatment Patient Details Name: MASAO JUNKER MRN: [id number] DOB: 08-11-1956 Today's Date: 09/04/2014    History of Present Illness 58 y.o. male admitted to [facility] on 09/02/14 for elective R TKA.  Pt has significant PMHx of HTN, peripherial neuropathy, and DM.    PT Comments    Pt at mod I to supervision for all mobility. Pt safe to D/C home from mobility standpoint. AROM in sitting lacking 5 degrees of extension and flexing 90 degrees.  Follow Up Recommendations  Home health PT     Equipment Recommendations  None recommended by PT    Recommendations for Other Services       Precautions / Restrictions Precautions Precautions: Knee Precaution Comments: reviewed no pillow under knee  Required Braces or Orthoses: Knee Immobilizer - Right Knee Immobilizer - Right: Discontinue once straight leg raise with < 10 degree lag Restrictions Weight Bearing Restrictions: Yes RLE Weight Bearing: Weight bearing as tolerated    Mobility  Bed Mobility Overal bed mobility: Modified Independent                Transfers Overall transfer level: Modified independent Equipment used: Rolling walker (2 wheeled) Transfers: Sit to/from Stand Sit to Stand: Modified independent (Device/Increase time)         General transfer comment: good technique and safety awareness  Ambulation/Gait Ambulation/Gait assistance: Modified independent (Device/Increase time) Ambulation Distance (Feet): 400 Feet Assistive device: Rolling walker (2 wheeled) Gait Pattern/deviations: Step-through pattern;Antalgic Gait velocity: decreased Gait velocity interpretation: Below normal speed for age/gender General Gait Details: cues for upright posture and to relax shoulders with gt   Stairs Stairs: Yes Stairs assistance: Supervision Stair Management: One rail Left;With crutches;Forwards;Step to pattern Number of Stairs: 5 General stair comments: min cues for sequencing  Wheelchair  Mobility    Modified Rankin (Stroke Patients Only)       Balance Overall balance assessment: No apparent balance deficits (not formally assessed)                                  Cognition Arousal/Alertness: Awake/alert Behavior During Therapy: WFL for tasks assessed/performed Overall Cognitive Status: Within Functional Limits for tasks assessed                      Exercises      General Comments General comments (skin integrity, edema, etc.): reviewed HEP at length; educated on car transfer technique and RW height for home       Pertinent Vitals/Pain Pain Assessment: 0-10 Pain Score: 5  Pain Location: Rt Knee with flexion Pain Descriptors / Indicators: Aching Pain Intervention(s): Monitored during session;Premedicated before session;Repositioned    Home Living                      Prior Function            PT Goals (current goals can now be found in the care plan section) Acute Rehab PT Goals Patient Stated Goal: to go home today PT Goal Formulation: With patient Time For Goal Achievement: 09/09/14 Potential to Achieve Goals: Good Progress towards PT goals: Goals met/education completed, patient discharged from PT    Frequency  7X/week    PT Plan Current plan remains appropriate    Co-evaluation             End of Session   Activity Tolerance: Patient tolerated treatment well Patient left: in  chair;with call bell/phone within reach     Time: 0800-0815 PT Time Calculation (min) (ACUTE ONLY): 15 min  Charges:  $Gait Training: 8-22 mins                    G Codes:      Elie Confer [place], [place] 09/04/2014, 8:21 AM

## 2014-09-04 NOTE — Progress Notes (Signed)
CARE MANAGEMENT NOTE 09/04/2014  Patient:  ROBIE, MCNIEL   Account Number:  [id number]  Date Initiated:  09/04/2014  Documentation initiated by:  [facility]  Subjective/Objective Assessment:   s/p rt TKA     Action/Plan:   Pt/Ot evals-recommended HHPT   Anticipated DC Date:  09/04/2014   Anticipated DC Plan:  [place]  CM consult      [facility] Choice  [place]   Choice offered to / List presented to:  C-1 Patient   DME arranged  CPM      DME agency  [employer]     [place] arranged  HH-1 RN  [place] [facility]      [place].   Status of service:  Completed, signed off Medicare Important Message given?   (If response is "NO", the following Medicare IM given date fields will be blank) Date Medicare IM given:   Medicare IM given by:   Date Additional Medicare IM given:   Additional Medicare IM given by:    Discharge Disposition:  [place]  Per UR Regulation:    If discussed at Long Length of Stay Meetings, dates discussed:    Comments:  09/04/14 Spoke with patient about HHC, he chose [facility]. Contacted Miranda at [facility] and set up [place] for Pt/INR monitroring. [employer] providing CPM. Patient has a rolling walker and 3N1 at home. Fuller Plan RN, BSN, CCM

## 2014-09-04 NOTE — Progress Notes (Signed)
Subjective: Pt stable walking well   Objective: Vital signs in last 24 hours: Temp:  [98.3 F (36.8 C)-102.3 F (39.1 C)] 98.3 F (36.8 C) (12/10 0654) Pulse Rate:  [110-114] 112 (12/10 0654) Resp:  [16-18] 18 (12/10 0400) BP: (102-127)/(49-78) 120/70 mmHg (12/10 0654) SpO2:  [94 %-98 %] 95 % (12/10 0654)  Intake/Output from previous day: 12/09 0701 - 12/10 0700 In: 960 [P.O.:960] Out: -  Intake/Output this shift:    Exam:  Sensation intact distally Intact pulses distally Dorsiflexion/Plantar flexion intact  Labs:  Recent Labs  09/03/14 0407 09/04/14 0501  HGB 11.9* 11.3*    Recent Labs  09/03/14 0407 09/04/14 0501  WBC 14.6* 12.5*  RBC 3.90* 3.58*  HCT 36.3* 34.2*  PLT 266 220    Recent Labs  09/03/14 0407  NA 141  K 4.4  CL 101  CO2 27  BUN 15  CREATININE 0.78  GLUCOSE 145*  CALCIUM 9.3    Recent Labs  09/03/14 0407 09/04/14 0501  INR 1.07 2.10*    Assessment/Plan: Plan dc today home with cpm and HHPT   DEAN,GREGORY SCOTT 09/04/2014, 8:08 AM

## 2014-09-04 NOTE — Plan of Care (Signed)
Problem: Phase III Progression Outcomes Goal: Ambulates Outcome: Completed/Met Date Met:  09/04/14 Goal: Anticoagulant follow-up in place Outcome: Adequate for Discharge  Problem: Discharge Progression Outcomes Goal: Anticoagulant follow-up in place Outcome: Completed/Met Date Met:  91/50/41 Goal: Complications resolved/controlled Outcome: Completed/Met Date Met:  09/04/14 Goal: Activity appropriate for discharge plan Outcome: Completed/Met Date Met:  09/04/14 Goal: Negotiates stairs Outcome: Completed/Met Date Met:  09/04/14 Goal: Demonstrates ADLs as appropriate Outcome: Completed/Met Date Met:  09/04/14 Goal: Incision without S/S infection Outcome: Completed/Met Date Met:  09/04/14 Goal: Other Discharge Outcomes/Goals Outcome: Completed/Met Date Met:  09/04/14

## 2014-10-04 NOTE — Discharge Summary (Signed)
Physician Discharge Summary  Patient ID: [name] MRN: [id number] DOB/AGE: [birth year] 59 y.o.  Admit date: 09/02/2014 Discharge date: 09/04/2014 Admission Diagnoses:  Active Problems:   Arthritis of knee   Discharge Diagnoses:  Same  Surgeries: Procedure(s): RIGHT TOTAL KNEE ARTHROPLASTY on 09/02/2014   Consultants:    Discharged Condition: Stable  Hospital Course: [name] is an 59 y.o. male who was admitted 09/02/2014 with a chief complaint of right knee pain, and found to have a diagnosis of right knee arthritis.  They were brought to the operating room on 09/02/2014 and underwent the above named procedures. He tolerated the procedure well and was started with physical therapy and CPM on postoperative day #1. He mobilized well and was angulating the halls at time of discharge. Incision intact at the time of discharge follow-up with me in 10 days for reassessment weightbearing as tolerated on Coumadin for DVT prophylaxis   Antibiotics given:  Anti-infectives    Start     Dose/Rate Route Frequency Ordered Stop   09/02/14 1600  ceFAZolin (ANCEF) IVPB 1 g/50 mL premix     1 g100 mL/hr over 30 Minutes Intravenous 3 times per day 09/02/14 1525 09/02/14 2311   09/02/14 0600  ceFAZolin (ANCEF) IVPB 2 g/50 mL premix     2 g100 mL/hr over 30 Minutes Intravenous On call to O.R. 09/01/14 1410 09/02/14 0830    .  Recent vital signs:  Filed Vitals:   09/04/14 0654  BP: 120/70  Pulse: 112  Temp: 98.3 F (36.8 C)  Resp:     Recent laboratory studies:  Results for orders placed or performed during the hospital encounter of 09/02/14  Glucose, capillary  Result Value Ref Range   Glucose-Capillary 116 (H) 70 - 99 mg/dL  Glucose, capillary  Result Value Ref Range   Glucose-Capillary 145 (H) 70 - 99 mg/dL   Comment 1 Documented in Chart    Comment 2 Notify RN   Glucose, capillary  Result Value Ref Range   Glucose-Capillary 199 (H) 70 - 99 mg/dL  Protime-INR  Result  Value Ref Range   Prothrombin Time 14.0 11.6 - 15.2 seconds   INR 1.07 0.00 - 1.49  CBC  Result Value Ref Range   WBC 14.6 (H) 4.0 - 10.5 K/uL   RBC 3.90 (L) 4.22 - 5.81 MIL/uL   Hemoglobin 11.9 (L) 13.0 - 17.0 g/dL   HCT 36.3 (L) 39.0 - 52.0 %   MCV 93.1 78.0 - 100.0 fL   MCH 30.5 26.0 - 34.0 pg   MCHC 32.8 30.0 - 36.0 g/dL   RDW 13.1 11.5 - 15.5 %   Platelets 266 150 - 400 K/uL  Basic metabolic panel  Result Value Ref Range   Sodium 141 137 - 147 mEq/L   Potassium 4.4 3.7 - 5.3 mEq/L   Chloride 101 96 - 112 mEq/L   CO2 27 19 - 32 mEq/L   Glucose, Bld 145 (H) 70 - 99 mg/dL   BUN 15 6 - 23 mg/dL   Creatinine, Ser 0.78 0.50 - 1.35 mg/dL   Calcium 9.3 8.4 - 10.5 mg/dL   GFR calc non Af Amer >90 >90 mL/min   GFR calc Af Amer >90 >90 mL/min   Anion gap 13 5 - 15  Glucose, capillary  Result Value Ref Range   Glucose-Capillary 143 (H) 70 - 99 mg/dL  Glucose, capillary  Result Value Ref Range   Glucose-Capillary 136 (H) 70 - 99 mg/dL  Glucose, capillary  Result Value Ref Range   Glucose-Capillary 140 (H) 70 - 99 mg/dL   Comment 1 Notify RN   Glucose, capillary  Result Value Ref Range   Glucose-Capillary 141 (H) 70 - 99 mg/dL   Comment 1 Notify RN   Protime-INR  Result Value Ref Range   Prothrombin Time 23.8 (H) 11.6 - 15.2 seconds   INR 2.10 (H) 0.00 - 1.49  CBC  Result Value Ref Range   WBC 12.5 (H) 4.0 - 10.5 K/uL   RBC 3.58 (L) 4.22 - 5.81 MIL/uL   Hemoglobin 11.3 (L) 13.0 - 17.0 g/dL   HCT 34.2 (L) 39.0 - 52.0 %   MCV 95.5 78.0 - 100.0 fL   MCH 31.6 26.0 - 34.0 pg   MCHC 33.0 30.0 - 36.0 g/dL   RDW 13.1 11.5 - 15.5 %   Platelets 220 150 - 400 K/uL  Glucose, capillary  Result Value Ref Range   Glucose-Capillary 157 (H) 70 - 99 mg/dL   Comment 1 Notify RN   Glucose, capillary  Result Value Ref Range   Glucose-Capillary 173 (H) 70 - 99 mg/dL   Comment 1 Notify RN     Discharge Medications:     Medication List    STOP taking these medications         Ginkgo Biloba 120 MG Caps     meloxicam 15 MG tablet  Commonly known as:  MOBIC     Omega 3 1200 MG Caps     sulfamethoxazole-trimethoprim 800-160 MG per tablet  Commonly known as:  BACTRIM DS      TAKE these medications        amLODipine 2.5 MG tablet  Commonly known as:  NORVASC  Take 2.5 mg by mouth every morning.     aspirin EC 81 MG tablet  Take 162 mg by mouth every morning.     atorvastatin 10 MG tablet  Commonly known as:  LIPITOR  Take 10 mg by mouth every evening.     cholecalciferol 1000 UNITS tablet  Commonly known as:  VITAMIN D  Take 1,000 Units by mouth daily.     gabapentin 100 MG capsule  Commonly known as:  NEURONTIN  Take 300 mg by mouth 3 (three) times daily.     HYDROmorphone 2 MG tablet  Commonly known as:  DILAUDID  Take 1 tablet (2 mg total) by mouth every 4 (four) hours as needed for moderate pain or severe pain. Post-operatively     insulin glargine 100 UNIT/ML injection  Commonly known as:  LANTUS  Inject 20 Units into the skin at bedtime.     lisinopril 40 MG tablet  Commonly known as:  PRINIVIL,ZESTRIL  Take 40 mg by mouth every morning.     metFORMIN 500 MG tablet  Commonly known as:  GLUCOPHAGE  Take 1,000 mg by mouth 2 (two) times daily with a meal.     methocarbamol 500 MG tablet  Commonly known as:  ROBAXIN  Take 1 tablet (500 mg total) by mouth every 6 (six) hours as needed for muscle spasms.     montelukast 10 MG tablet  Commonly known as:  SINGULAIR  Take 10 mg by mouth at bedtime.     multivitamins ther. w/minerals Tabs tablet  Take 1 tablet by mouth every morning.     oxybutynin 5 MG tablet  Commonly known as:  DITROPAN  Take 1 tablet (5 mg total) by mouth every 8 (eight) hours as needed for bladder spasms. /  stent discomfort     pioglitazone 15 MG tablet  Commonly known as:  ACTOS  Take 15 mg by mouth daily.     senna-docusate 8.6-50 MG per tablet  Commonly known as:  Senokot-S  Take 1 tablet by mouth 2 (two)  times daily. While taking pain meds to prevent constipation     Vitamin B-12 2500 MCG Subl  Place 1 tablet under the tongue daily.     vitamin C 1000 MG tablet  Take 1,000 mg by mouth daily.     warfarin 5 MG tablet  Commonly known as:  COUMADIN  Take 1 tablet (5 mg total) by mouth daily.        Diagnostic Studies: No results found.  Disposition: 01-Home or Self Care      Discharge Instructions    Call MD / Call 911    Complete by:  As directed   If you experience chest pain or shortness of breath, CALL 911 and be transported to the hospital emergency room.  If you develope a fever above 101 F, pus (white drainage) or increased drainage or redness at the wound, or calf pain, call your surgeon's office.     Constipation Prevention    Complete by:  As directed   Drink plenty of fluids.  Prune juice may be helpful.  You may use a stool softener, such as Colace (over the counter) 100 mg twice a day.  Use MiraLax (over the counter) for constipation as needed.     Diet - low sodium heart healthy    Complete by:  As directed      Discharge instructions    Complete by:  As directed   CPM 1 - 2 hours per 8 Work on full extension Keep incision dry Change dressing on Saturday then remove on Monday     Increase activity slowly as tolerated    Complete by:  As directed               Signed: Zorana Brockwell SCOTT 10/04/2014, 10:03 AM

## 2014-12-31 ENCOUNTER — Emergency Department (HOSPITAL_COMMUNITY)
Admission: EM | Admit: 2014-12-31 | Discharge: 2014-12-31 | Disposition: A | Discharge status: Home / Self Care | Payer: BLUE CROSS/BLUE SHIELD | Attending: Emergency Medicine | Admitting: Emergency Medicine

## 2014-12-31 ENCOUNTER — Encounter (HOSPITAL_COMMUNITY): Payer: Self-pay

## 2014-12-31 DIAGNOSIS — M1990 Unspecified osteoarthritis, unspecified site: Secondary | ICD-10-CM | POA: Insufficient documentation

## 2014-12-31 DIAGNOSIS — Z8701 Personal history of pneumonia (recurrent): Secondary | ICD-10-CM | POA: Diagnosis not present

## 2014-12-31 DIAGNOSIS — Z7982 Long term (current) use of aspirin: Secondary | ICD-10-CM | POA: Insufficient documentation

## 2014-12-31 DIAGNOSIS — Z794 Long term (current) use of insulin: Secondary | ICD-10-CM | POA: Insufficient documentation

## 2014-12-31 DIAGNOSIS — Z79899 Other long term (current) drug therapy: Secondary | ICD-10-CM | POA: Diagnosis not present

## 2014-12-31 DIAGNOSIS — G629 Polyneuropathy, unspecified: Secondary | ICD-10-CM | POA: Diagnosis not present

## 2014-12-31 DIAGNOSIS — Z7901 Long term (current) use of anticoagulants: Secondary | ICD-10-CM | POA: Insufficient documentation

## 2014-12-31 DIAGNOSIS — E119 Type 2 diabetes mellitus without complications: Secondary | ICD-10-CM | POA: Diagnosis not present

## 2014-12-31 DIAGNOSIS — R109 Unspecified abdominal pain: Secondary | ICD-10-CM | POA: Diagnosis not present

## 2014-12-31 DIAGNOSIS — I10 Essential (primary) hypertension: Secondary | ICD-10-CM | POA: Insufficient documentation

## 2014-12-31 DIAGNOSIS — Z87442 Personal history of urinary calculi: Secondary | ICD-10-CM | POA: Insufficient documentation

## 2014-12-31 DIAGNOSIS — Z9089 Acquired absence of other organs: Secondary | ICD-10-CM | POA: Diagnosis not present

## 2014-12-31 HISTORY — DX: Calculus of kidney: N20.0

## 2014-12-31 LAB — URINALYSIS, ROUTINE W REFLEX MICROSCOPIC
Bilirubin Urine: NEGATIVE
Glucose, UA: 250 mg/dL — AB
Ketones, ur: NEGATIVE mg/dL
Nitrite: NEGATIVE
Protein, ur: 100 mg/dL — AB
Specific Gravity, Urine: 1.022 (ref 1.005–1.030)
Urobilinogen, UA: 0.2 mg/dL (ref 0.0–1.0)
pH: 5.5 (ref 5.0–8.0)

## 2014-12-31 LAB — COMPREHENSIVE METABOLIC PANEL
ALT: 32 U/L (ref 0–53)
AST: 25 U/L (ref 0–37)
Albumin: 4.5 g/dL (ref 3.5–5.2)
Alkaline Phosphatase: 85 U/L (ref 39–117)
Anion gap: 9 (ref 5–15)
BUN: 19 mg/dL (ref 6–23)
CO2: 27 mmol/L (ref 19–32)
Calcium: 9.3 mg/dL (ref 8.4–10.5)
Chloride: 106 mmol/L (ref 96–112)
Creatinine, Ser: 0.88 mg/dL (ref 0.50–1.35)
GFR calc Af Amer: >90 mL/min (ref >90)
GFR calc non Af Amer: >90 mL/min (ref >90)
Glucose, Bld: 151 mg/dL — ABNORMAL HIGH (ref 70–99)
Potassium: 3.9 mmol/L (ref 3.5–5.1)
Sodium: 142 mmol/L (ref 135–145)
Total Bilirubin: 0.7 mg/dL (ref 0.3–1.2)
Total Protein: 8 g/dL (ref 6.0–8.3)

## 2014-12-31 LAB — CBC
HCT: 43 % (ref 39.0–52.0)
Hemoglobin: 14.2 g/dL (ref 13.0–17.0)
MCH: 30.7 pg (ref 26.0–34.0)
MCHC: 33 g/dL (ref 30.0–36.0)
MCV: 93.1 fL (ref 78.0–100.0)
Platelets: 292 10*3/uL (ref 150–400)
RBC: 4.62 MIL/uL (ref 4.22–5.81)
RDW: 13.4 % (ref 11.5–15.5)
WBC: 11 10*3/uL — ABNORMAL HIGH (ref 4.0–10.5)

## 2014-12-31 LAB — URINE MICROSCOPIC-ADD ON

## 2014-12-31 MED ORDER — DEXTROSE 5 % IV SOLN
1.0000 g | Freq: Once | INTRAVENOUS | Status: AC
  Administered 2014-12-31: 1 g via INTRAVENOUS
  Filled 2014-12-31: qty 10

## 2014-12-31 MED ORDER — HYDROMORPHONE HCL 1 MG/ML IJ SOLN
1.0000 mg | Freq: Once | INTRAMUSCULAR | Status: AC
Start: 2014-12-31 — End: 2014-12-31
  Administered 2014-12-31: 1 mg via INTRAVENOUS
  Filled 2014-12-31: qty 1

## 2014-12-31 MED ORDER — PROMETHAZINE HCL 25 MG/ML IJ SOLN
12.5000 mg | Freq: Once | INTRAMUSCULAR | Status: AC
  Administered 2014-12-31: 12.5 mg via INTRAVENOUS
  Filled 2014-12-31: qty 1

## 2014-12-31 MED ORDER — PROMETHAZINE HCL 25 MG PO TABS: 25.0000 mg | ORAL_TABLET | Freq: Four times a day (QID) | ORAL | Status: DC | PRN

## 2014-12-31 MED ORDER — SODIUM CHLORIDE 0.9 % IV BOLUS (SEPSIS)
1000.0000 mL | Freq: Once | INTRAVENOUS | Status: AC
  Administered 2014-12-31: 1000 mL via INTRAVENOUS

## 2014-12-31 MED ORDER — OXYCODONE-ACETAMINOPHEN 5-325 MG PO TABS: 1.0000 | ORAL_TABLET | ORAL | Status: DC | PRN

## 2014-12-31 MED ORDER — HYDROMORPHONE HCL 1 MG/ML IJ SOLN
1.0000 mg | Freq: Once | INTRAMUSCULAR | Status: AC
  Administered 2014-12-31: 1 mg via INTRAVENOUS
  Filled 2014-12-31: qty 1

## 2014-12-31 MED ORDER — KETOROLAC TROMETHAMINE 15 MG/ML IJ SOLN
15.0000 mg | Freq: Once | INTRAMUSCULAR | Status: AC
  Administered 2014-12-31: 15 mg via INTRAVENOUS
  Filled 2014-12-31: qty 1

## 2014-12-31 NOTE — Discharge Instructions (Signed)
Flank Pain °Flank pain refers to pain that is located on the side of the body between the upper abdomen and the back. The pain may occur over a short period of time (acute) or may be long-term or reoccurring (chronic). It may be mild or severe. Flank pain can be caused by many things. °CAUSES  °Some of the more common causes of flank pain include: °· Muscle strains.   °· Muscle spasms.   °· A disease of your spine (vertebral disk disease).   °· A lung infection (pneumonia).   °· Fluid around your lungs (pulmonary edema).   °· A kidney infection.   °· Kidney stones.   °· A very painful skin rash caused by the chickenpox virus (shingles).   °· Gallbladder disease.   °HOME CARE INSTRUCTIONS  °Home care will depend on the cause of your pain. In general, °· Rest as directed by your caregiver. °· Drink enough fluids to keep your urine clear or pale yellow. °· Only take over-the-counter or prescription medicines as directed by your caregiver. Some medicines may help relieve the pain. °· Tell your caregiver about any changes in your pain. °· Follow up with your caregiver as directed. °SEEK IMMEDIATE MEDICAL CARE IF:  °· Your pain is not controlled with medicine.   °· You have new or worsening symptoms. °· Your pain increases.   °· You have abdominal pain.   °· You have shortness of breath.   °· You have persistent nausea or vomiting.   °· You have swelling in your abdomen.   °· You feel faint or pass out.   °· You have blood in your urine. °· You have a fever or persistent symptoms for more than 2-3 days. °· You have a fever and your symptoms suddenly get worse. °MAKE SURE YOU:  °· Understand these instructions. °· Will watch your condition. °· Will get help right away if you are not doing well or get worse. °Document Released: 11/03/2005 Document Revised: 06/06/2012 Document Reviewed: 04/26/2012 °[employer]® Patient Information ©2015 [employer]. This information is not intended to replace advice given to you by your  health care provider. Make sure you discuss any questions you have with your health care provider. ° °

## 2014-12-31 NOTE — ED Notes (Signed)
Patient c/o right flank pain, nausea, and hematuria. Patient has a history of kidney stones.

## 2015-01-02 LAB — URINE CULTURE
Colony Count: NO GROWTH
Culture: NO GROWTH

## 2015-01-07 NOTE — ED Provider Notes (Signed)
CSN: [id number]     Arrival date & time 12/31/14  1644 History   First MD Initiated Contact with Patient 12/31/14 1722     Chief Complaint  Patient presents with  . Flank Pain     (Consider location/radiation/quality/duration/timing/severity/associated sxs/prior Treatment) HPI  59 year old male with right flank pain. Symptom onset early this morning. Patient has a past history of numerous kidney stones. Since the current symptoms feel similar. Sharp in nature. Associated with hematuria. No appreciable exacerbating relieving factors. Fevers or chills. Mild nausea, but no vomiting. .  Past Medical History  Diagnosis Date  . Kidney stone   . Hypertension   . Arthritis   . Peripheral neuropathy     left fifth finger, left toe  . Pneumonia     hx of  . Diabetes mellitus     fasting 120s  . Kidney stones    Past Surgical History  Procedure Laterality Date  . Lithotripsy    . Uretral stent    . Cystoscopy with retrograde pyelogram, ureteroscopy and stent placement Bilateral 02/01/2014    Procedure: CYSTOSCOPY WITH BILATERAL RETROGRADE CYSTOSCOPY, BILATERAL RETROGRADE, PYELOGRAM, RIGHT URETEROSCOPY, STONE BASKETRY,  AND BILATERAL STENT PLACEMENT;  Surgeon: Alexis Frock, MD;  Location: [facility];  Service: Urology;  Laterality: Bilateral;  . Appendectomy    . Nephrolithotomy Left 03/17/2014    Procedure: NEPHROLITHOTOMY PERCUTANEOUS with access;  Surgeon: Alexis Frock, MD;  Location: [facility];  Service: Urology;  Laterality: Left;  . Cystoscopy w/ ureteral stent placement Bilateral 03/17/2014    Procedure: CYSTOSCOPY WITH RIGHT STENT PULL  ;  Surgeon: Alexis Frock, MD;  Location: [facility];  Service: Urology;  Laterality: Bilateral;  . Nephrolithotomy Left 03/19/2014    Procedure: 2ND STAGE NEPHROLITHOTOMY PERCUTANEOUS left ureteroscopy with lasr stone treatment and stent placement, with access;  Surgeon: Alexis Frock, MD;  Location: [facility];  Service: Urology;  Laterality: Left;  . Holmium  laser application Left 7/51/0258    Procedure: HOLMIUM LASER APPLICATION;  Surgeon: Alexis Frock, MD;  Location: [facility];  Service: Urology;  Laterality: Left;  . Total knee arthroplasty Right 09/02/2014    Procedure: RIGHT TOTAL KNEE ARTHROPLASTY;  Surgeon: Meredith Pel, MD;  Location: [place];  Service: Orthopedics;  Laterality: Right;   History reviewed. No pertinent family history. History  Substance Use Topics  . Smoking status: Never Smoker   . Smokeless tobacco: Never Used  . Alcohol Use: 1.2 oz/week    2 Cans of beer per week     Comment: occasional/ 1-2 beers week    Review of Systems  All systems reviewed and negative, other than as noted in HPI.   Allergies  Review of patient's allergies indicates no known allergies.  Home Medications   Prior to Admission medications   Medication Sig Start Date End Date Taking? Authorizing Provider  amLODipine (NORVASC) 2.5 MG tablet Take 2.5 mg by mouth every morning.    Yes Historical Provider, MD  Ascorbic Acid (VITAMIN C) 1000 MG tablet Take 1,000 mg by mouth daily.   Yes Historical Provider, MD  aspirin EC 81 MG tablet Take 162 mg by mouth every morning.   Yes Historical Provider, MD  atorvastatin (LIPITOR) 10 MG tablet Take 10 mg by mouth every evening.   Yes Historical Provider, MD  cholecalciferol (VITAMIN D) 1000 UNITS tablet Take 1,000 Units by mouth daily.   Yes Historical Provider, MD  Cyanocobalamin (VITAMIN B-12) 2500 MCG SUBL Place 1 tablet under the tongue daily.  Yes Historical Provider, MD  gabapentin (NEURONTIN) 100 MG capsule Take 300 mg by mouth 3 (three) times daily.    Yes Historical Provider, MD  Ginkgo Biloba 120 MG CAPS Take 2 capsules by mouth daily.   Yes Historical Provider, MD  insulin glargine (LANTUS) 100 UNIT/ML injection Inject 20 Units into the skin at bedtime.   Yes Historical Provider, MD  lisinopril (PRINIVIL,ZESTRIL) 40 MG tablet Take 40 mg by mouth every morning.    Yes Historical Provider,  MD  meloxicam (MOBIC) 15 MG tablet Take 15 mg by mouth daily as needed for pain (pain).   Yes Historical Provider, MD  metFORMIN (GLUCOPHAGE) 500 MG tablet Take 1,000 mg by mouth 2 (two) times daily with a meal.    Yes Historical Provider, MD  methocarbamol (ROBAXIN) 500 MG tablet Take 1 tablet (500 mg total) by mouth every 6 (six) hours as needed for muscle spasms. 09/04/14  Yes Meredith Pel, MD  montelukast (SINGULAIR) 10 MG tablet Take 10 mg by mouth at bedtime.   Yes Historical Provider, MD  Multiple Vitamins-Minerals (MULTIVITAMINS THER. W/MINERALS) TABS Take 1 tablet by mouth every morning.    Yes Historical Provider, MD  Omega-3 Fatty Acids (FISH OIL) 1200 MG CAPS Take 1 capsule by mouth daily.   Yes Historical Provider, MD  oxybutynin (DITROPAN) 5 MG tablet Take 1 tablet (5 mg total) by mouth every 8 (eight) hours as needed for bladder spasms. / stent discomfort 02/01/14  Yes Alexis Frock, MD  Oxycodone HCl 10 MG TABS Take 10 mg by mouth daily as needed (pain).   Yes Historical Provider, MD  pioglitazone (ACTOS) 15 MG tablet Take 15 mg by mouth daily.   Yes Historical Provider, MD  HYDROmorphone (DILAUDID) 2 MG tablet Take 1 tablet (2 mg total) by mouth every 4 (four) hours as needed for moderate pain or severe pain. Post-operatively Patient not taking: Reported on 12/31/2014 09/04/14   Meredith Pel, MD  oxyCODONE-acetaminophen (PERCOCET/ROXICET) 5-325 MG per tablet Take 1-2 tablets by mouth every 4 (four) hours as needed for severe pain. 12/31/14   Virgel Manifold, MD  promethazine (PHENERGAN) 25 MG tablet Take 1 tablet (25 mg total) by mouth every 6 (six) hours as needed for nausea or vomiting. 12/31/14   Virgel Manifold, MD  senna-docusate (SENOKOT-S) 8.6-50 MG per tablet Take 1 tablet by mouth 2 (two) times daily. While taking pain meds to prevent constipation Patient not taking: Reported on 08/25/2014 03/21/14   Alexis Frock, MD  warfarin (COUMADIN) 5 MG tablet Take 1 tablet (5 mg  total) by mouth daily. Patient not taking: Reported on 12/31/2014 09/04/14   Meredith Pel, MD   BP 154/90 mmHg  Pulse 92  Temp(Src) 98.1 F (36.7 C) (Oral)  Resp 16  SpO2 95% Physical Exam  Constitutional: He appears well-developed and well-nourished.  Significant bed. Appears somewhat uncomfortable, but not toxic.  HENT:  Head: Normocephalic and atraumatic.  Eyes: Conjunctivae are normal. Right eye exhibits no discharge. Left eye exhibits no discharge.  Neck: Neck supple.  Cardiovascular: Normal rate, regular rhythm and normal heart sounds.  Exam reveals no gallop and no friction rub.   No murmur heard. Pulmonary/Chest: Effort normal and breath sounds normal. No respiratory distress.  Abdominal: Soft. He exhibits no distension. There is no tenderness.  Genitourinary:  Right CVA tenderness  Musculoskeletal: He exhibits no edema or tenderness.  Neurological: He is alert.  Skin: Skin is warm and dry.  Psychiatric: He has a normal mood  and affect. His behavior is normal. Thought content normal.  Nursing note and vitals reviewed.   ED Course  Procedures (including critical care time) Labs Review Labs Reviewed  CBC - Abnormal; Notable for the following:    WBC 11.0 (*)    All other components within normal limits  COMPREHENSIVE METABOLIC PANEL - Abnormal; Notable for the following:    Glucose, Bld 151 (*)    All other components within normal limits  URINALYSIS, ROUTINE W REFLEX MICROSCOPIC - Abnormal; Notable for the following:    APPearance CLOUDY (*)    Glucose, UA 250 (*)    Hgb urine dipstick LARGE (*)    Protein, ur 100 (*)    Leukocytes, UA MODERATE (*)    All other components within normal limits  URINE MICROSCOPIC-ADD ON - Abnormal; Notable for the following:    Bacteria, UA FEW (*)    All other components within normal limits  URINE CULTURE    Imaging Review No results found.   EKG Interpretation None      MDM   Final diagnoses:  Right flank  pain    59 year old male with right flank pain. Past history of recurrent kidney stones. Symptoms today are consistent with ureteral colic. He does have hematuria as well. Questionable UTI. Symptomatically treatment with markedly improved symptoms. Discussed imaging versus presumptive treatment at this time. Patient is comfortable with expectant management. Return precautions were discussed. Urology follow-up as needed otherwise.  Virgel Manifold, MD 01/07/15 [id number]

## 2015-03-05 ENCOUNTER — Emergency Department (HOSPITAL_COMMUNITY)
Admission: EM | Admit: 2015-03-05 | Discharge: 2015-03-05 | Disposition: A | Discharge status: Home / Self Care | Payer: BLUE CROSS/BLUE SHIELD | Attending: Emergency Medicine | Admitting: Emergency Medicine

## 2015-03-05 ENCOUNTER — Encounter (HOSPITAL_COMMUNITY): Payer: Self-pay | Admitting: Emergency Medicine

## 2015-03-05 ENCOUNTER — Emergency Department (HOSPITAL_COMMUNITY): Payer: BLUE CROSS/BLUE SHIELD

## 2015-03-05 DIAGNOSIS — R109 Unspecified abdominal pain: Secondary | ICD-10-CM

## 2015-03-05 DIAGNOSIS — G629 Polyneuropathy, unspecified: Secondary | ICD-10-CM | POA: Insufficient documentation

## 2015-03-05 DIAGNOSIS — Z794 Long term (current) use of insulin: Secondary | ICD-10-CM | POA: Insufficient documentation

## 2015-03-05 DIAGNOSIS — Z7982 Long term (current) use of aspirin: Secondary | ICD-10-CM | POA: Diagnosis not present

## 2015-03-05 DIAGNOSIS — N200 Calculus of kidney: Secondary | ICD-10-CM

## 2015-03-05 DIAGNOSIS — M1990 Unspecified osteoarthritis, unspecified site: Secondary | ICD-10-CM | POA: Diagnosis not present

## 2015-03-05 DIAGNOSIS — Z8701 Personal history of pneumonia (recurrent): Secondary | ICD-10-CM | POA: Diagnosis not present

## 2015-03-05 DIAGNOSIS — E119 Type 2 diabetes mellitus without complications: Secondary | ICD-10-CM | POA: Insufficient documentation

## 2015-03-05 DIAGNOSIS — Z791 Long term (current) use of non-steroidal anti-inflammatories (NSAID): Secondary | ICD-10-CM | POA: Diagnosis not present

## 2015-03-05 DIAGNOSIS — Z79899 Other long term (current) drug therapy: Secondary | ICD-10-CM | POA: Insufficient documentation

## 2015-03-05 DIAGNOSIS — I10 Essential (primary) hypertension: Secondary | ICD-10-CM | POA: Insufficient documentation

## 2015-03-05 LAB — CBC WITH DIFFERENTIAL/PLATELET
BASOS ABS: 0 10*3/uL (ref 0.0–0.1)
BASOS PCT: 0 % (ref 0–1)
EOS ABS: 0.4 10*3/uL (ref 0.0–0.7)
Eosinophils Relative: 3 % (ref 0–5)
HEMATOCRIT: 41.5 % (ref 39.0–52.0)
HEMOGLOBIN: 13.9 g/dL (ref 13.0–17.0)
Lymphocytes Relative: 29 % (ref 12–46)
Lymphs Abs: 4 10*3/uL (ref 0.7–4.0)
MCH: 30.7 pg (ref 26.0–34.0)
MCHC: 33.5 g/dL (ref 30.0–36.0)
MCV: 91.6 fL (ref 78.0–100.0)
MONO ABS: 1.1 10*3/uL — AB (ref 0.1–1.0)
Monocytes Relative: 8 % (ref 3–12)
Neutro Abs: 8 10*3/uL — ABNORMAL HIGH (ref 1.7–7.7)
Neutrophils Relative %: 60 % (ref 43–77)
PLATELETS: 306 10*3/uL (ref 150–400)
RBC: 4.53 MIL/uL (ref 4.22–5.81)
RDW: 13.2 % (ref 11.5–15.5)
WBC: 13.6 10*3/uL — ABNORMAL HIGH (ref 4.0–10.5)

## 2015-03-05 LAB — I-STAT CHEM 8, ED
BUN: 19 mg/dL (ref 6–20)
Calcium, Ion: 1.27 mmol/L — ABNORMAL HIGH (ref 1.12–1.23)
Chloride: 103 mmol/L (ref 101–111)
Creatinine, Ser: 0.8 mg/dL (ref 0.61–1.24)
Glucose, Bld: 146 mg/dL — ABNORMAL HIGH (ref 65–99)
HCT: 45 % (ref 39.0–52.0)
Hemoglobin: 15.3 g/dL (ref 13.0–17.0)
Potassium: 4.1 mmol/L (ref 3.5–5.1)
Sodium: 141 mmol/L (ref 135–145)
TCO2: 24 mmol/L (ref 0–100)

## 2015-03-05 LAB — URINALYSIS, ROUTINE W REFLEX MICROSCOPIC
Bilirubin Urine: NEGATIVE
Glucose, UA: 250 mg/dL — AB
Ketones, ur: NEGATIVE mg/dL
Nitrite: NEGATIVE
PROTEIN: 100 mg/dL — AB
SPECIFIC GRAVITY, URINE: 1.02 (ref 1.005–1.030)
UROBILINOGEN UA: 0.2 mg/dL (ref 0.0–1.0)
pH: 5 (ref 5.0–8.0)

## 2015-03-05 LAB — URINE MICROSCOPIC-ADD ON

## 2015-03-05 IMAGING — CT CT ABD-PELV W/O CM
2 of 4 series · 16 of 46 positions shown, 18 images · non-contrast
Comparison: [DATE]

CLINICAL DATA: Right flank pain beginning this morning. History of
kidney stones.

EXAM:
CT ABDOMEN AND PELVIS WITHOUT CONTRAST
TECHNIQUE: Multidetector CT imaging of the abdomen and pelvis was performed
following the standard protocol without IV contrast.

[Series 2: abd/pel w/o · axial · non-contrast · 0.74mm/px · z∈[-503,-53]mm · 13 of 98 slices shown, 15 images]
[im 4/98  soft-tissue]
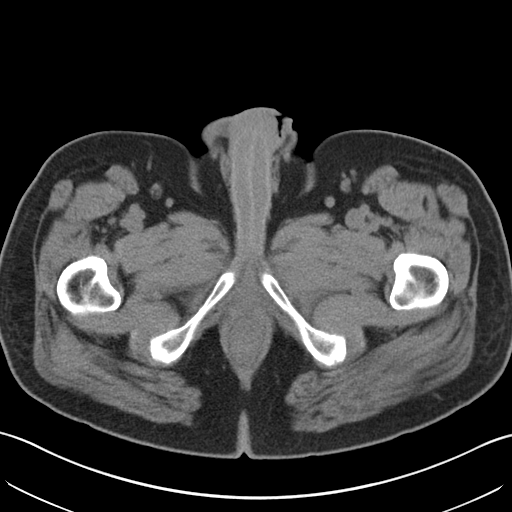
[im 4/98  bone]
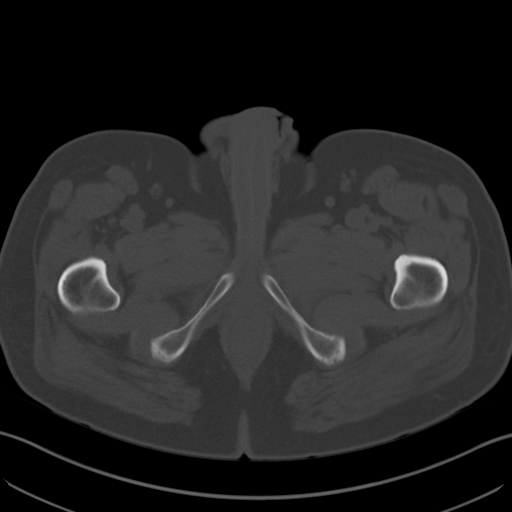
[im 12/98  soft-tissue]
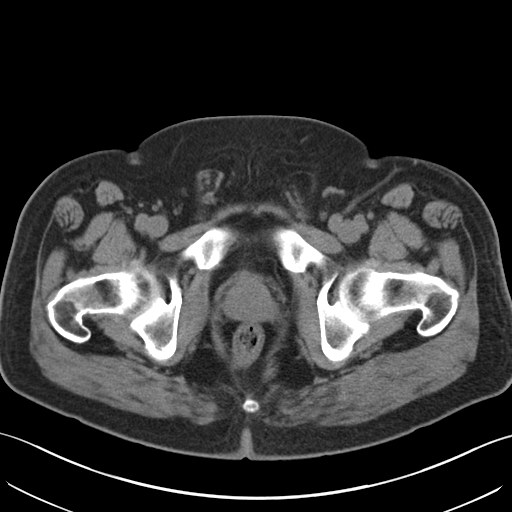
[im 20/98  soft-tissue]
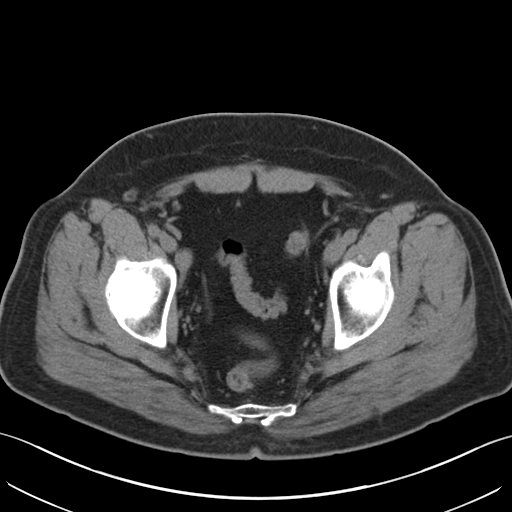
[im 28/98  soft-tissue]
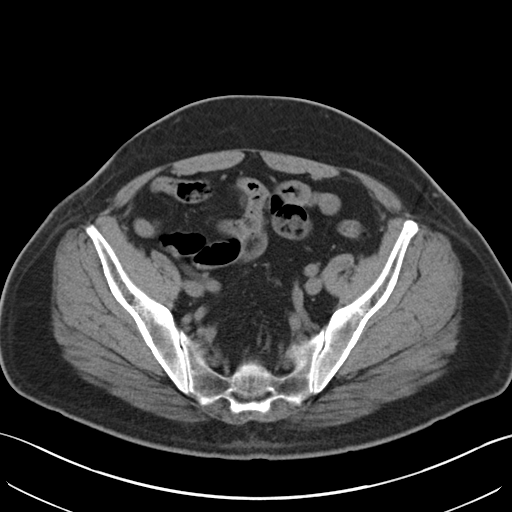
[im 35/98  soft-tissue]
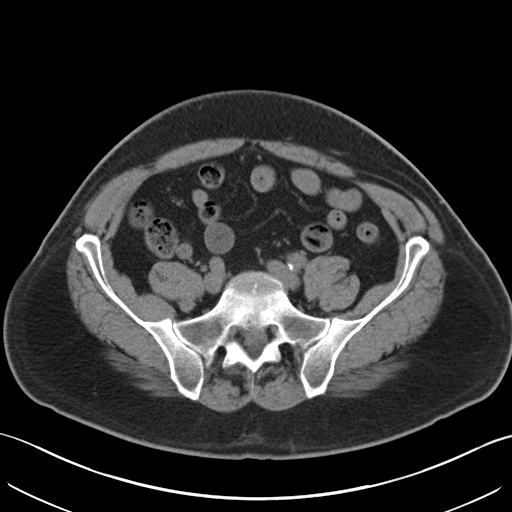
[im 43/98  soft-tissue]
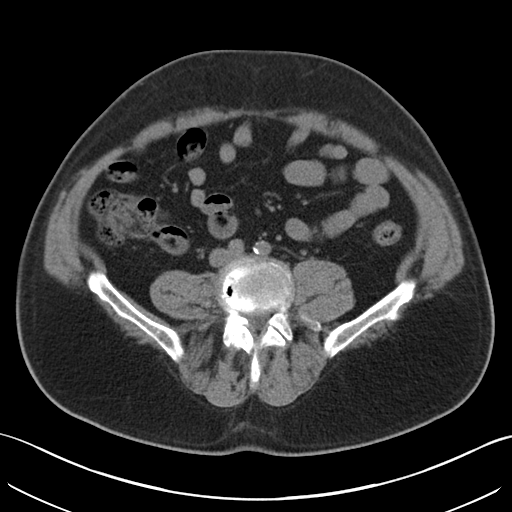
[im 51/98  soft-tissue]
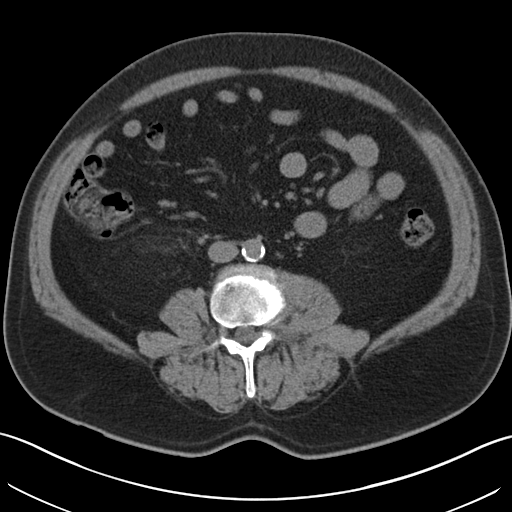
[im 55/98  soft-tissue]
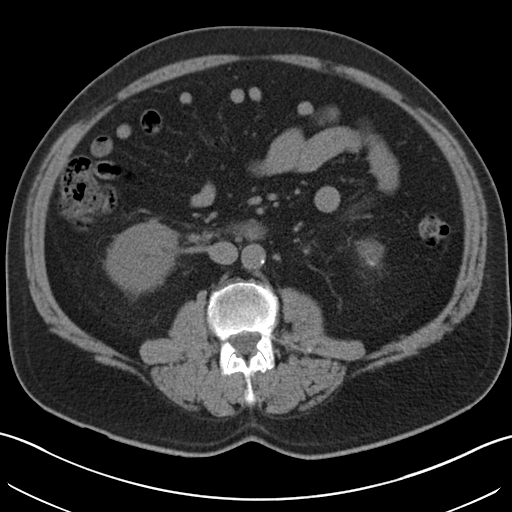
[im 63/98  soft-tissue]
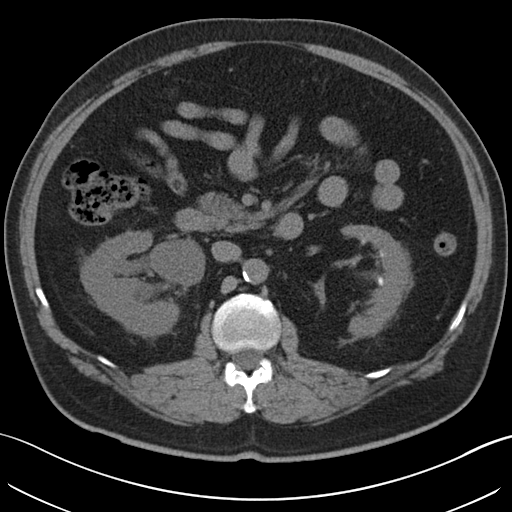
[im 63/98  bone]
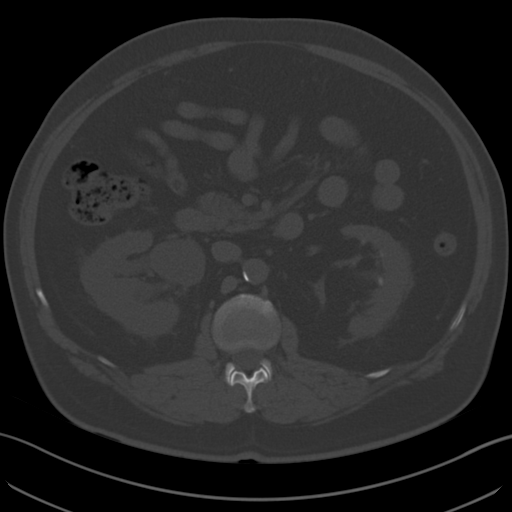
[im 70/98  soft-tissue]
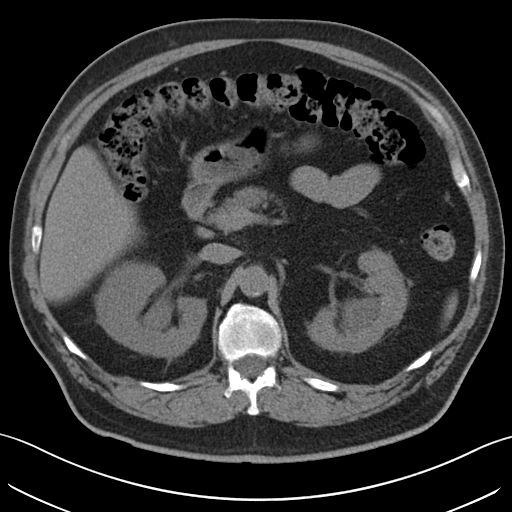
[im 78/98  soft-tissue]
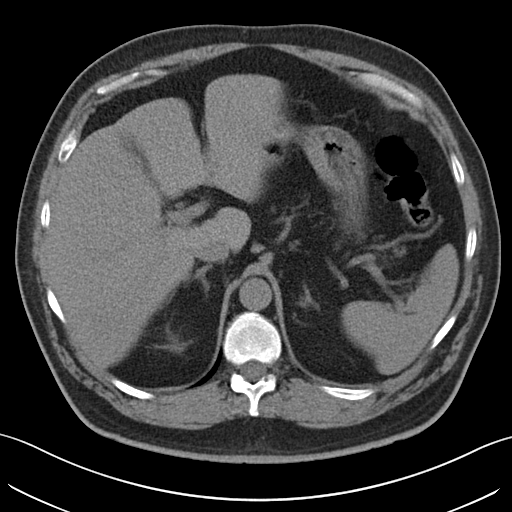
[im 86/98  soft-tissue]
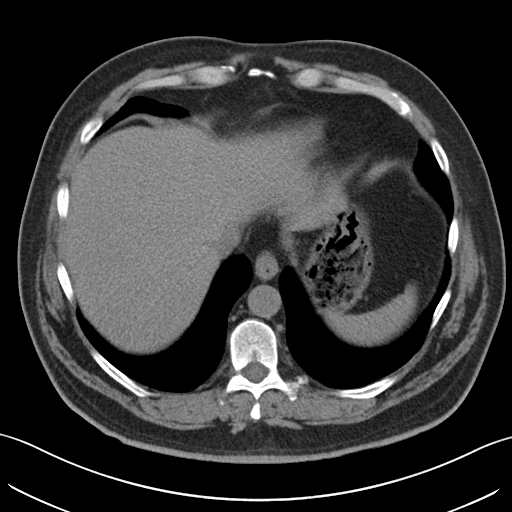
[im 94/98  soft-tissue]
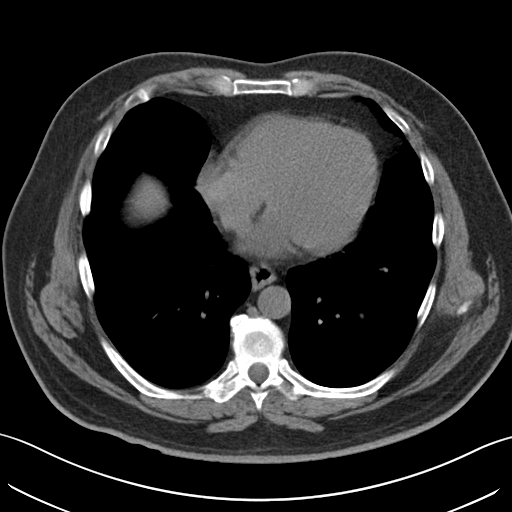

[Series 5: coronal · coronal · 0.74mm/px · 3 of 109 slices shown]
[im 37/109  soft-tissue]
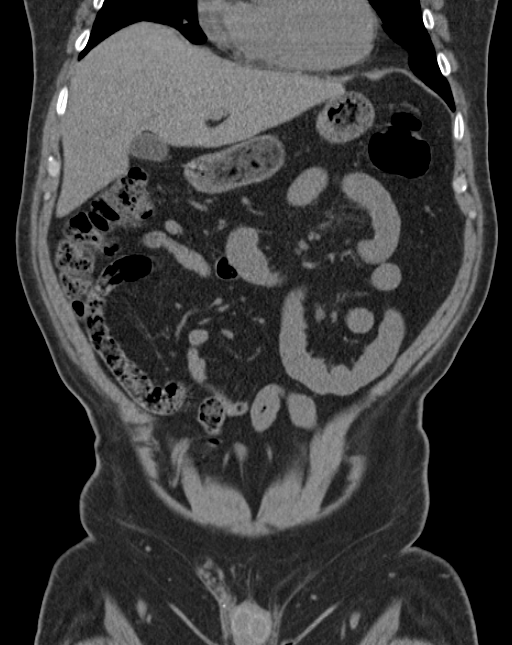
[im 49/109  soft-tissue]
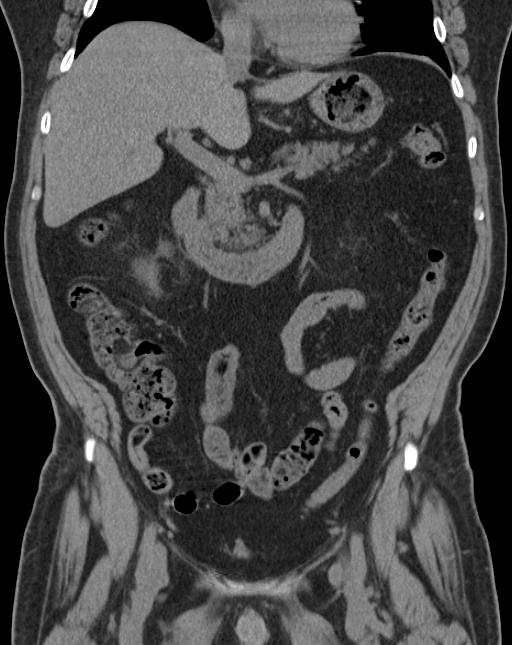
[im 61/109  soft-tissue]
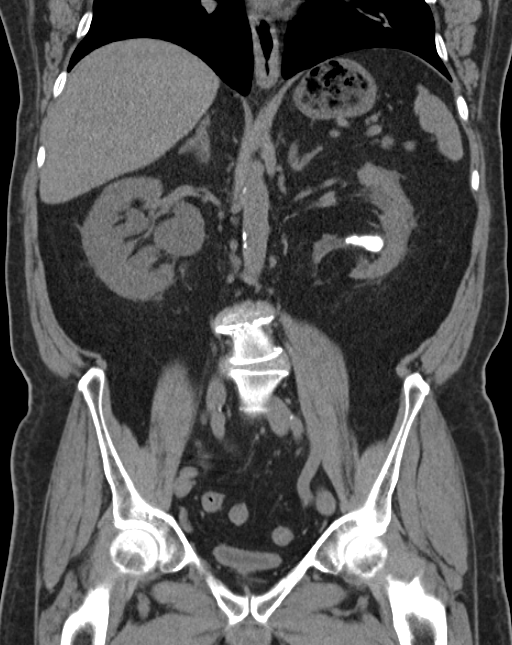

[16 of 46 positions shown; findings below may reference images not displayed]

FINDINGS: BODY WALL: No contributory findings.

LOWER CHEST: No contributory findings.

ABDOMEN/PELVIS:

Liver: No focal abnormality.

Biliary: No evidence of biliary obstruction or stone.

Pancreas: Unremarkable.

Spleen: Unremarkable.

Adrenals: Unremarkable.

Kidneys and ureters: Moderate right hydronephrosis with asymmetric
periureteric edema secondary to a 6 mm stone at the UPJ. Branching
calculus in the left urinary collecting system is diminished,
although there is still stone casting the lower and interpolar
collecting system. Intrarenal calculus on the right that is
punctate. There is some fullness of the upper pole collecting system
on the left, but decreased from prior study.

Bladder: Unremarkable.

Reproductive: No pathologic findings.

Bowel: No obstruction. No inflammatory change.

Retroperitoneum: Stable enlarged external chain lymph node on the
right measuring 14 mm in short axis.

Peritoneum: No ascites or pneumoperitoneum.

Vascular: No acute abnormality.

OSSEOUS: No acute abnormalities.
IMPRESSION: 1. Obstructing 6 mm stone at the right UPJ.
2. Decreased but still extensive branching nephrolithiasis on the
left.

## 2015-03-05 MED ORDER — TAMSULOSIN HCL 0.4 MG PO CAPS: 0.4000 mg | ORAL_CAPSULE | Freq: Two times a day (BID) | ORAL | Status: DC

## 2015-03-05 MED ORDER — OXYCODONE-ACETAMINOPHEN 5-325 MG PO TABS: 1.0000 | ORAL_TABLET | ORAL | Status: DC | PRN

## 2015-03-05 MED ORDER — HYDROMORPHONE HCL 1 MG/ML IJ SOLN
1.0000 mg | Freq: Once | INTRAMUSCULAR | Status: AC
  Administered 2015-03-05: 1 mg via INTRAVENOUS
  Filled 2015-03-05: qty 1

## 2015-03-05 MED ORDER — IBUPROFEN 800 MG PO TABS: 800.0000 mg | ORAL_TABLET | Freq: Three times a day (TID) | ORAL | Status: DC

## 2015-03-05 MED ORDER — KETOROLAC TROMETHAMINE 30 MG/ML IJ SOLN
30.0000 mg | Freq: Once | INTRAMUSCULAR | Status: AC
  Administered 2015-03-05: 30 mg via INTRAVENOUS
  Filled 2015-03-05: qty 1

## 2015-03-05 MED ORDER — PROMETHAZINE HCL 25 MG PO TABS: 25.0000 mg | ORAL_TABLET | Freq: Four times a day (QID) | ORAL | Status: DC | PRN

## 2015-03-05 NOTE — Discharge Instructions (Signed)
Do NOT eat after midnight  Please call your doctor for a followup appointment within 24-48 hours. When you talk to your doctor please let them know that you were seen in the emergency department and have them acquire all of your records so that they can discuss the findings with you and formulate a treatment plan to fully care for your new and ongoing problems.

## 2015-03-05 NOTE — ED Notes (Signed)
Pt c/ right flank pain, has had stones in the past and states it feels as if the stone is moving. C/o n/v as well.

## 2015-03-05 NOTE — ED Notes (Signed)
Questions concerns denied r/t dc. Pt ambulatory and a&ox4

## 2015-03-05 NOTE — ED Provider Notes (Signed)
CSN: [id number]     Arrival date & time 03/05/15  1731 History   First MD Initiated Contact with Patient 03/05/15 1736     Chief Complaint  Patient presents with  . Nephrolithiasis     (Consider location/radiation/quality/duration/timing/severity/associated sxs/prior Treatment) HPI Comments: The patient is a 59 year old male, he has a history of nephrolithiasis, he has required significant intervention last year when he had a staghorn calculus on the left, required multiple procedures to remove what he describes as 95% of the stone. He states that yesterday he developed right flank pain. This was severe, sharp and stabbing, intermittent, colicky and radiating to the right side. This is severe at times, associated with nausea and vomiting prior to arrival, he has had hematuria all morning. He has taken no medications prior to arrival.  The history is provided by the patient and medical records.    Past Medical History  Diagnosis Date  . Kidney stone   . Hypertension   . Arthritis   . Peripheral neuropathy     left fifth finger, left toe  . Pneumonia     hx of  . Diabetes mellitus     fasting 120s  . Kidney stones    Past Surgical History  Procedure Laterality Date  . Lithotripsy    . Uretral stent    . Cystoscopy with retrograde pyelogram, ureteroscopy and stent placement Bilateral 02/01/2014    Procedure: CYSTOSCOPY WITH BILATERAL RETROGRADE CYSTOSCOPY, BILATERAL RETROGRADE, PYELOGRAM, RIGHT URETEROSCOPY, STONE BASKETRY,  AND BILATERAL STENT PLACEMENT;  Surgeon: Alexis Frock, MD;  Location: [facility];  Service: Urology;  Laterality: Bilateral;  . Appendectomy    . Nephrolithotomy Left 03/17/2014    Procedure: NEPHROLITHOTOMY PERCUTANEOUS with access;  Surgeon: Alexis Frock, MD;  Location: [facility];  Service: Urology;  Laterality: Left;  . Cystoscopy w/ ureteral stent placement Bilateral 03/17/2014    Procedure: CYSTOSCOPY WITH RIGHT STENT PULL  ;  Surgeon: Alexis Frock, MD;   Location: [facility];  Service: Urology;  Laterality: Bilateral;  . Nephrolithotomy Left 03/19/2014    Procedure: 2ND STAGE NEPHROLITHOTOMY PERCUTANEOUS left ureteroscopy with lasr stone treatment and stent placement, with access;  Surgeon: Alexis Frock, MD;  Location: [facility];  Service: Urology;  Laterality: Left;  . Holmium laser application Left 3/97/6734    Procedure: HOLMIUM LASER APPLICATION;  Surgeon: Alexis Frock, MD;  Location: [facility];  Service: Urology;  Laterality: Left;  . Total knee arthroplasty Right 09/02/2014    Procedure: RIGHT TOTAL KNEE ARTHROPLASTY;  Surgeon: Meredith Pel, MD;  Location: [place];  Service: Orthopedics;  Laterality: Right;   History reviewed. No pertinent family history. History  Substance Use Topics  . Smoking status: Never Smoker   . Smokeless tobacco: Never Used  . Alcohol Use: 1.2 oz/week    2 Cans of beer per week     Comment: occasional/ 1-2 beers week    Review of Systems  All other systems reviewed and are negative.     Allergies  Review of patient's allergies indicates no known allergies.  Home Medications   Prior to Admission medications   Medication Sig Start Date End Date Taking? Authorizing Provider  amLODipine (NORVASC) 2.5 MG tablet Take 2.5 mg by mouth every morning.     Historical Provider, MD  Ascorbic Acid (VITAMIN C) 1000 MG tablet Take 1,000 mg by mouth daily.    Historical Provider, MD  aspirin EC 81 MG tablet Take 162 mg by mouth every morning.    Historical Provider,  MD  atorvastatin (LIPITOR) 10 MG tablet Take 10 mg by mouth every evening.    Historical Provider, MD  cholecalciferol (VITAMIN D) 1000 UNITS tablet Take 1,000 Units by mouth daily.    Historical Provider, MD  Cyanocobalamin (VITAMIN B-12) 2500 MCG SUBL Place 1 tablet under the tongue daily.    Historical Provider, MD  gabapentin (NEURONTIN) 100 MG capsule Take 300 mg by mouth 3 (three) times daily.     Historical Provider, MD  Ginkgo Biloba 120 MG CAPS  Take 2 capsules by mouth daily.    Historical Provider, MD  HYDROmorphone (DILAUDID) 2 MG tablet Take 1 tablet (2 mg total) by mouth every 4 (four) hours as needed for moderate pain or severe pain. Post-operatively Patient not taking: Reported on 12/31/2014 09/04/14   Meredith Pel, MD  ibuprofen (ADVIL,MOTRIN) 800 MG tablet Take 1 tablet (800 mg total) by mouth 3 (three) times daily. 03/05/15   Noemi Chapel, MD  insulin glargine (LANTUS) 100 UNIT/ML injection Inject 20 Units into the skin at bedtime.    Historical Provider, MD  lisinopril (PRINIVIL,ZESTRIL) 40 MG tablet Take 40 mg by mouth every morning.     Historical Provider, MD  meloxicam (MOBIC) 15 MG tablet Take 15 mg by mouth daily as needed for pain (pain).    Historical Provider, MD  metFORMIN (GLUCOPHAGE) 500 MG tablet Take 1,000 mg by mouth 2 (two) times daily with a meal.     Historical Provider, MD  methocarbamol (ROBAXIN) 500 MG tablet Take 1 tablet (500 mg total) by mouth every 6 (six) hours as needed for muscle spasms. 09/04/14   Meredith Pel, MD  montelukast (SINGULAIR) 10 MG tablet Take 10 mg by mouth at bedtime.    Historical Provider, MD  Multiple Vitamins-Minerals (MULTIVITAMINS THER. W/MINERALS) TABS Take 1 tablet by mouth every morning.     Historical Provider, MD  Omega-3 Fatty Acids (FISH OIL) 1200 MG CAPS Take 1 capsule by mouth daily.    Historical Provider, MD  oxybutynin (DITROPAN) 5 MG tablet Take 1 tablet (5 mg total) by mouth every 8 (eight) hours as needed for bladder spasms. / stent discomfort 02/01/14   Alexis Frock, MD  Oxycodone HCl 10 MG TABS Take 10 mg by mouth daily as needed (pain).    Historical Provider, MD  oxyCODONE-acetaminophen (PERCOCET) 5-325 MG per tablet Take 1 tablet by mouth every 4 (four) hours as needed. 03/05/15   Noemi Chapel, MD  pioglitazone (ACTOS) 15 MG tablet Take 15 mg by mouth daily.    Historical Provider, MD  promethazine (PHENERGAN) 25 MG tablet Take 1 tablet (25 mg total) by  mouth every 6 (six) hours as needed for nausea or vomiting. 03/05/15   Noemi Chapel, MD  senna-docusate (SENOKOT-S) 8.6-50 MG per tablet Take 1 tablet by mouth 2 (two) times daily. While taking pain meds to prevent constipation Patient not taking: Reported on 08/25/2014 03/21/14   Alexis Frock, MD  tamsulosin (FLOMAX) 0.4 MG CAPS capsule Take 1 capsule (0.4 mg total) by mouth 2 (two) times daily. 03/05/15   Noemi Chapel, MD  warfarin (COUMADIN) 5 MG tablet Take 1 tablet (5 mg total) by mouth daily. Patient not taking: Reported on 12/31/2014 09/04/14   Meredith Pel, MD   BP 155/77 mmHg  Pulse 95  Temp(Src) 98.3 F (36.8 C) (Oral)  Resp 20  SpO2 98% Physical Exam  Constitutional: He appears well-developed and well-nourished. No distress.  HENT:  Head: Normocephalic and atraumatic.  Mouth/Throat: Oropharynx  is clear and moist. No oropharyngeal exudate.  Eyes: Conjunctivae and EOM are normal. Pupils are equal, round, and reactive to light. Right eye exhibits no discharge. Left eye exhibits no discharge. No scleral icterus.  Neck: Normal range of motion. Neck supple. No JVD present. No thyromegaly present.  Cardiovascular: Normal rate, regular rhythm, normal heart sounds and intact distal pulses.  Exam reveals no gallop and no friction rub.   No murmur heard. Pulmonary/Chest: Effort normal and breath sounds normal. No respiratory distress. He has no wheezes. He has no rales.  Abdominal: Soft. Bowel sounds are normal. He exhibits no distension and no mass. There is tenderness ( mild R sided ttp in the UQ, R CVA ttp present).  Musculoskeletal: Normal range of motion. He exhibits no edema or tenderness.  Lymphadenopathy:    He has no cervical adenopathy.  Neurological: He is alert. Coordination normal.  Skin: Skin is warm and dry. No rash noted. No erythema.  Psychiatric: He has a normal mood and affect. His behavior is normal.  Nursing note and vitals reviewed.   ED Course  Procedures  (including critical care time) Labs Review Labs Reviewed  CBC WITH DIFFERENTIAL/PLATELET - Abnormal; Notable for the following:    WBC 13.6 (*)    Neutro Abs 8.0 (*)    Monocytes Absolute 1.1 (*)    All other components within normal limits  URINALYSIS, ROUTINE W REFLEX MICROSCOPIC (NOT AT [facility]) - Abnormal; Notable for the following:    Color, Urine AMBER (*)    APPearance CLOUDY (*)    Glucose, UA 250 (*)    Hgb urine dipstick LARGE (*)    Protein, ur 100 (*)    Leukocytes, UA SMALL (*)    All other components within normal limits  URINE MICROSCOPIC-ADD ON - Abnormal; Notable for the following:    Bacteria, UA FEW (*)    All other components within normal limits  I-STAT CHEM 8, ED - Abnormal; Notable for the following:    Glucose, Bld 146 (*)    Calcium, Ion 1.27 (*)    All other components within normal limits    Imaging Review Ct Abdomen Pelvis Wo Contrast  03/05/2015   CLINICAL DATA:  Right flank pain beginning this morning. History of kidney stones.  EXAM: CT ABDOMEN AND PELVIS WITHOUT CONTRAST  TECHNIQUE: Multidetector CT imaging of the abdomen and pelvis was performed following the standard protocol without IV contrast.  COMPARISON:  02/01/2014  FINDINGS: BODY WALL: No contributory findings.  LOWER CHEST: No contributory findings.  ABDOMEN/PELVIS:  Liver: No focal abnormality.  Biliary: No evidence of biliary obstruction or stone.  Pancreas: Unremarkable.  Spleen: Unremarkable.  Adrenals: Unremarkable.  Kidneys and ureters: Moderate right hydronephrosis with asymmetric periureteric edema secondary to a 6 mm stone at the UPJ. Branching calculus in the left urinary collecting system is diminished, although there is still stone casting the lower and interpolar collecting system. Intrarenal calculus on the right that is punctate. There is some fullness of the upper pole collecting system on the left, but decreased from prior study.  Bladder: Unremarkable.  Reproductive: No pathologic  findings.  Bowel: No obstruction. No inflammatory change.  Retroperitoneum: Stable enlarged external chain lymph node on the right measuring 14 mm in short axis.  Peritoneum: No ascites or pneumoperitoneum.  Vascular: No acute abnormality.  OSSEOUS: No acute abnormalities.  IMPRESSION: 1. Obstructing 6 mm stone at the right UPJ. 2. Decreased but still extensive branching nephrolithiasis on the left.   Electronically  Signed   By: Monte Fantasia M.D.   On: 03/05/2015 18:45      MDM   Final diagnoses:  Flank pain  Kidney stone    Vital signs are normal, tenderness in the right flank is consistent with a kidney stone as is the historical onset of his symptoms. The clinical course is suspicious for recurrent kidney stone. We'll obtain CT scan imaging to the size of prior stones, pain medication has been ordered, the patient is in agreement with the plan.  Dr. Tresa Moore is the patient's urologist, we'll start the patient on stronger pain medications, CT scan shows that the patient has an obstructive 6 mm proximal ureteral stone. This is causing moderate hydronephrosis. I have personally reviewed the CT scan images and I agree with the radiologist interpretation. I will discuss this with urology. Laboratory workup reviewed as well. This shows normal renal function with a creatinine of 0.8, he does have hematuria and a leukocytosis. No signs of infection. The patient feels much better after receiving intravenous Toradol and Dilaudid.  D/w Dr. Baltazar Najjar - he agrees with f/u in the office tomorrow - he states on call provider will see pt - pt in agreement - good pain control.  Pt amenable to the plan.  Meds given in ED:  Medications  HYDROmorphone (DILAUDID) injection 1 mg (not administered)  ketorolac (TORADOL) 30 MG/ML injection 30 mg (30 mg Intravenous Given 03/05/15 1805)  HYDROmorphone (DILAUDID) injection 1 mg (1 mg Intravenous Given 03/05/15 1805)    New Prescriptions   IBUPROFEN (ADVIL,MOTRIN) 800 MG  TABLET    Take 1 tablet (800 mg total) by mouth 3 (three) times daily.   OXYCODONE-ACETAMINOPHEN (PERCOCET) 5-325 MG PER TABLET    Take 1 tablet by mouth every 4 (four) hours as needed.   PROMETHAZINE (PHENERGAN) 25 MG TABLET    Take 1 tablet (25 mg total) by mouth every 6 (six) hours as needed for nausea or vomiting.   TAMSULOSIN (FLOMAX) 0.4 MG CAPS CAPSULE    Take 1 capsule (0.4 mg total) by mouth 2 (two) times daily.      Noemi Chapel, MD 03/05/15 [phone]

## 2015-03-05 NOTE — ED Notes (Signed)
Nurse currently starting IV 

## 2015-05-05 ENCOUNTER — Encounter (HOSPITAL_COMMUNITY): Payer: Self-pay | Admitting: Emergency Medicine

## 2015-05-05 ENCOUNTER — Emergency Department (HOSPITAL_COMMUNITY): Payer: BLUE CROSS/BLUE SHIELD

## 2015-05-05 ENCOUNTER — Emergency Department (HOSPITAL_COMMUNITY)
Admission: EM | Admit: 2015-05-05 | Discharge: 2015-05-05 | Disposition: A | Discharge status: Home / Self Care | Payer: BLUE CROSS/BLUE SHIELD | Attending: Emergency Medicine | Admitting: Emergency Medicine

## 2015-05-05 DIAGNOSIS — E119 Type 2 diabetes mellitus without complications: Secondary | ICD-10-CM | POA: Insufficient documentation

## 2015-05-05 DIAGNOSIS — M545 Low back pain, unspecified: Secondary | ICD-10-CM

## 2015-05-05 DIAGNOSIS — Z960 Presence of urogenital implants: Secondary | ICD-10-CM | POA: Insufficient documentation

## 2015-05-05 DIAGNOSIS — M1990 Unspecified osteoarthritis, unspecified site: Secondary | ICD-10-CM | POA: Insufficient documentation

## 2015-05-05 DIAGNOSIS — Z8701 Personal history of pneumonia (recurrent): Secondary | ICD-10-CM | POA: Diagnosis not present

## 2015-05-05 DIAGNOSIS — I10 Essential (primary) hypertension: Secondary | ICD-10-CM | POA: Diagnosis not present

## 2015-05-05 DIAGNOSIS — Z87442 Personal history of urinary calculi: Secondary | ICD-10-CM | POA: Insufficient documentation

## 2015-05-05 DIAGNOSIS — Z794 Long term (current) use of insulin: Secondary | ICD-10-CM | POA: Diagnosis not present

## 2015-05-05 DIAGNOSIS — Z79899 Other long term (current) drug therapy: Secondary | ICD-10-CM | POA: Insufficient documentation

## 2015-05-05 DIAGNOSIS — G629 Polyneuropathy, unspecified: Secondary | ICD-10-CM | POA: Insufficient documentation

## 2015-05-05 LAB — COMPREHENSIVE METABOLIC PANEL
ALBUMIN: 4.2 g/dL (ref 3.5–5.0)
ALT: 28 U/L (ref 17–63)
ANION GAP: 10 (ref 5–15)
AST: 23 U/L (ref 15–41)
Alkaline Phosphatase: 80 U/L (ref 38–126)
BILIRUBIN TOTAL: 0.6 mg/dL (ref 0.3–1.2)
BUN: 15 mg/dL (ref 6–20)
CALCIUM: 9.7 mg/dL (ref 8.9–10.3)
CO2: 28 mmol/L (ref 22–32)
CREATININE: 0.79 mg/dL (ref 0.61–1.24)
Chloride: 104 mmol/L (ref 101–111)
GFR calc Af Amer: >60 mL/min (ref >60)
Glucose, Bld: 128 mg/dL — ABNORMAL HIGH (ref 65–99)
Potassium: 3.6 mmol/L (ref 3.5–5.1)
Sodium: 142 mmol/L (ref 135–145)
TOTAL PROTEIN: 7.8 g/dL (ref 6.5–8.1)

## 2015-05-05 LAB — URINALYSIS, ROUTINE W REFLEX MICROSCOPIC
BILIRUBIN URINE: NEGATIVE
Glucose, UA: 100 mg/dL — AB
Ketones, ur: NEGATIVE mg/dL
Nitrite: NEGATIVE
Protein, ur: NEGATIVE mg/dL
Specific Gravity, Urine: 1.027 (ref 1.005–1.030)
Urobilinogen, UA: 0.2 mg/dL (ref 0.0–1.0)
pH: 5.5 (ref 5.0–8.0)

## 2015-05-05 LAB — CBC WITH DIFFERENTIAL/PLATELET
Basophils Absolute: 0 10*3/uL (ref 0.0–0.1)
Basophils Relative: 0 % (ref 0–1)
Eosinophils Absolute: 0.5 10*3/uL (ref 0.0–0.7)
Eosinophils Relative: 5 % (ref 0–5)
HCT: 38.8 % — ABNORMAL LOW (ref 39.0–52.0)
Hemoglobin: 12.9 g/dL — ABNORMAL LOW (ref 13.0–17.0)
LYMPHS ABS: 4.7 10*3/uL — AB (ref 0.7–4.0)
Lymphocytes Relative: 43 % (ref 12–46)
MCH: 30.8 pg (ref 26.0–34.0)
MCHC: 33.2 g/dL (ref 30.0–36.0)
MCV: 92.6 fL (ref 78.0–100.0)
Monocytes Absolute: 0.8 10*3/uL (ref 0.1–1.0)
Monocytes Relative: 7 % (ref 3–12)
NEUTROS ABS: 4.9 10*3/uL (ref 1.7–7.7)
NEUTROS PCT: 45 % (ref 43–77)
PLATELETS: 309 10*3/uL (ref 150–400)
RBC: 4.19 MIL/uL — ABNORMAL LOW (ref 4.22–5.81)
RDW: 13.8 % (ref 11.5–15.5)
WBC: 10.8 10*3/uL — ABNORMAL HIGH (ref 4.0–10.5)

## 2015-05-05 LAB — URINE MICROSCOPIC-ADD ON

## 2015-05-05 LAB — LIPASE, BLOOD: Lipase: 14 U/L — ABNORMAL LOW (ref 22–51)

## 2015-05-05 IMAGING — CT CT RENAL STONE PROTOCOL
2 of 3 series · 16 of 37 positions shown, 18 images · non-contrast
Comparison: [DATE]

CLINICAL DATA: Left lower back pain for 1 week

EXAM:
CT ABDOMEN AND PELVIS WITHOUT CONTRAST
TECHNIQUE: Multidetector CT imaging of the abdomen and pelvis was performed
following the standard protocol without IV contrast.

[Series 4: lung · axial · 0.75mm/px · z∈[+673,+773]mm · 13 of 23 slices shown, 15 images]
[im 2/23  soft-tissue]
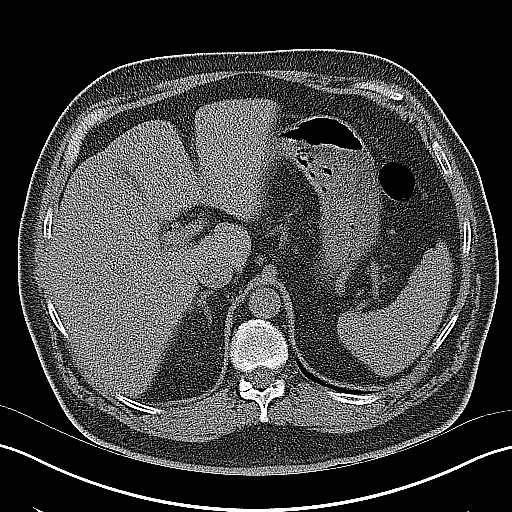
[im 2/23  bone]
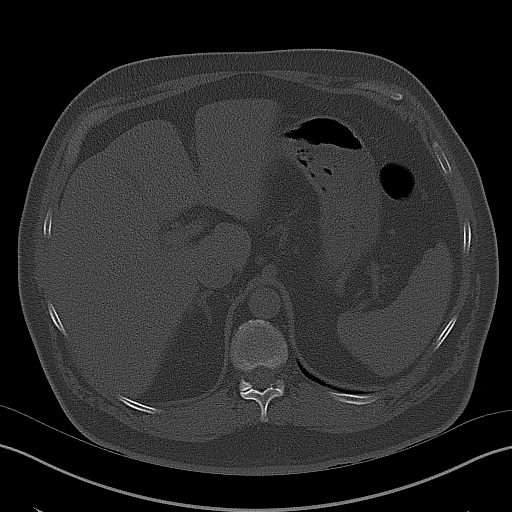
[im 4/23  soft-tissue]
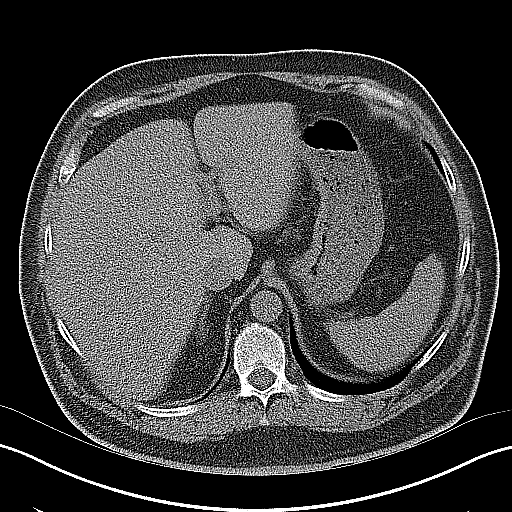
[im 6/23  soft-tissue]
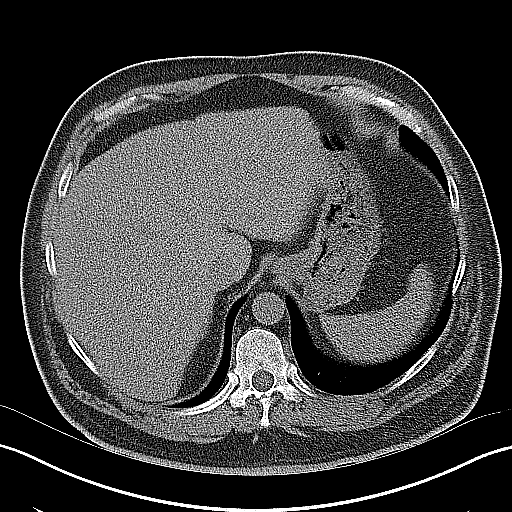
[im 7/23  soft-tissue]
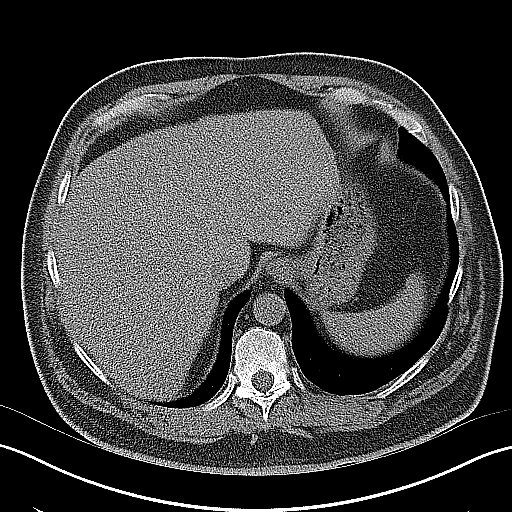
[im 9/23  soft-tissue]
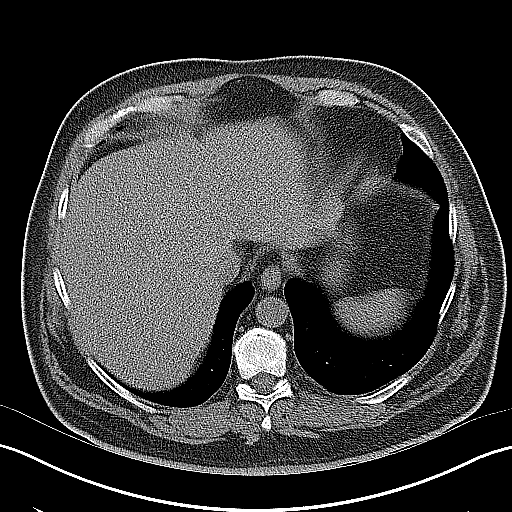
[im 10/23  soft-tissue]
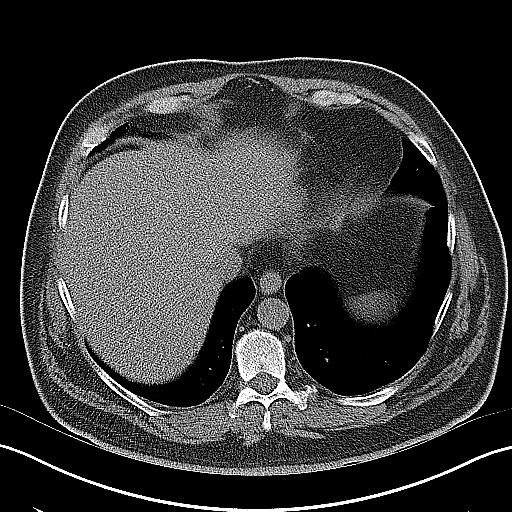
[im 12/23  soft-tissue]
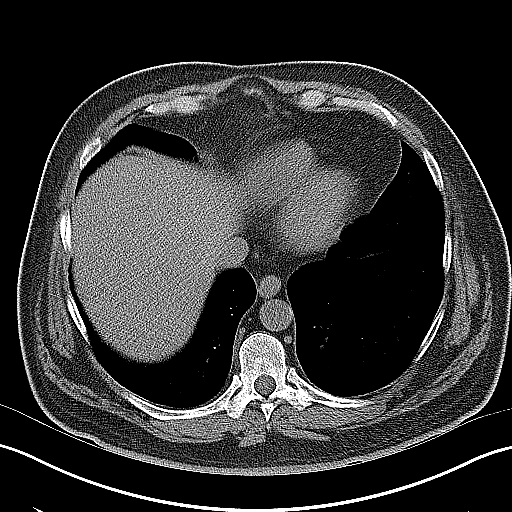
[im 14/23  soft-tissue]
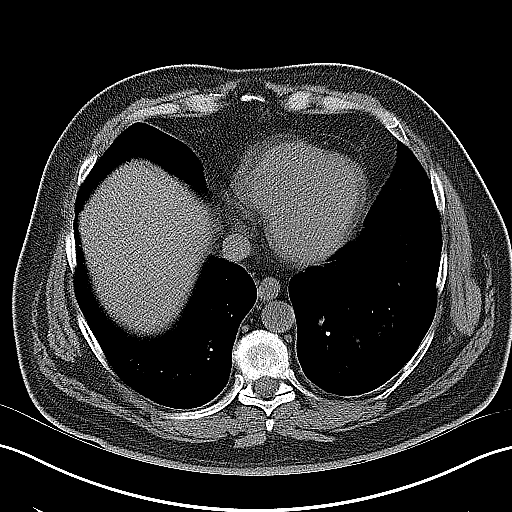
[im 15/23  soft-tissue]
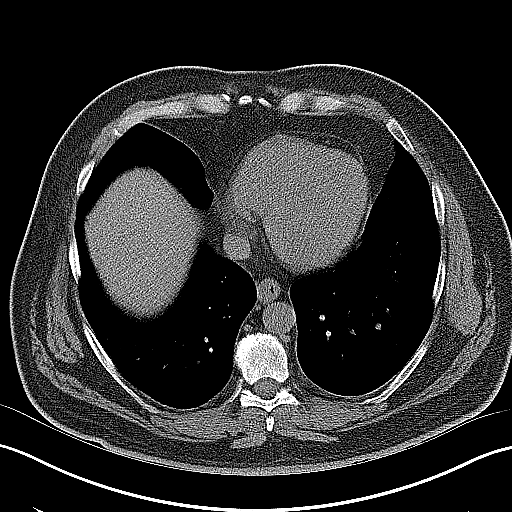
[im 15/23  bone]
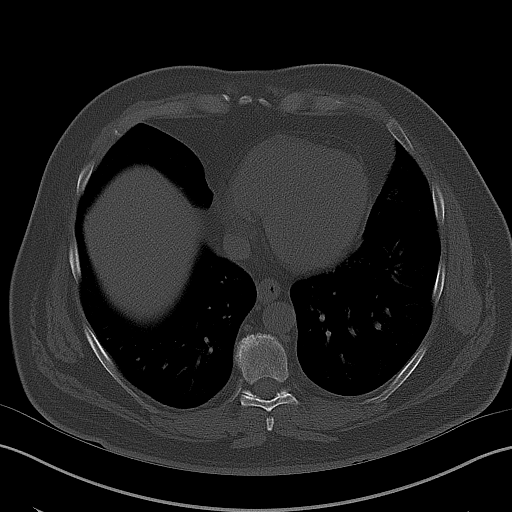
[im 17/23  soft-tissue]
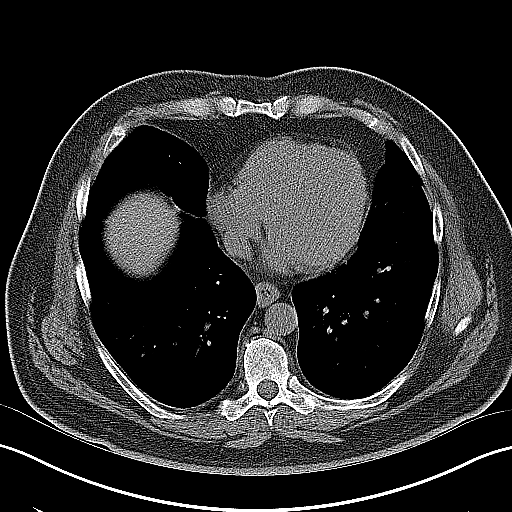
[im 18/23  soft-tissue]
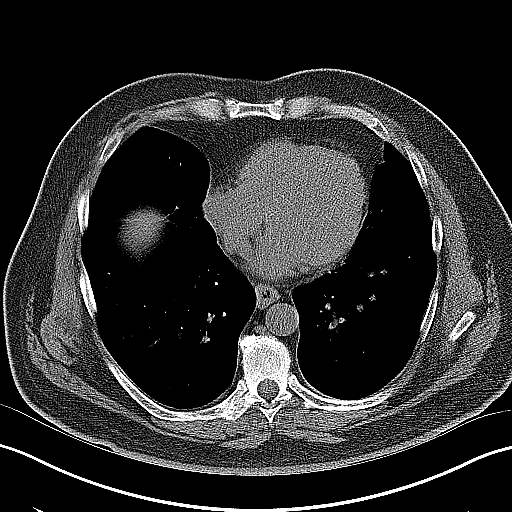
[im 20/23  soft-tissue]
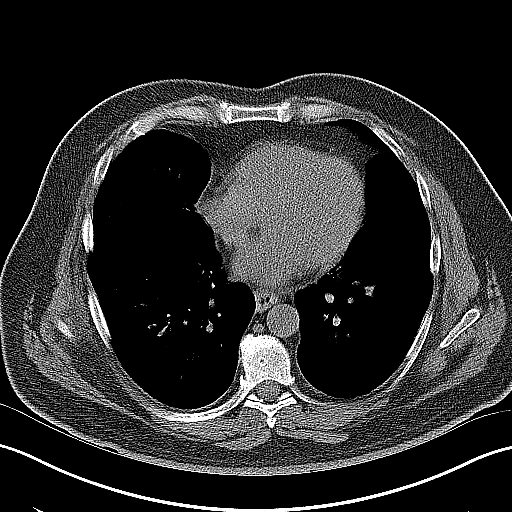
[im 22/23  soft-tissue]
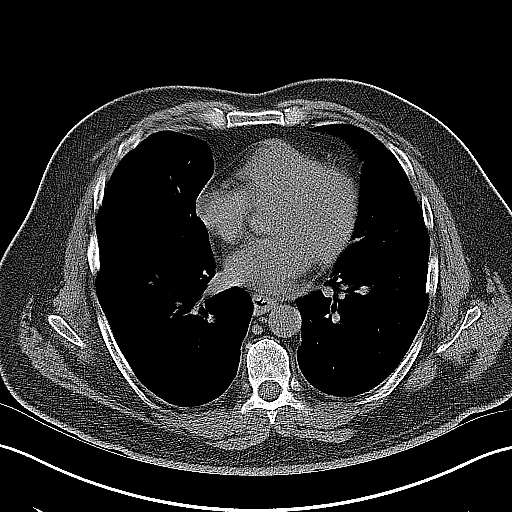

[Series 5: coronal · coronal · 0.74mm/px · 3 of 107 slices shown]
[im 36/107  soft-tissue]
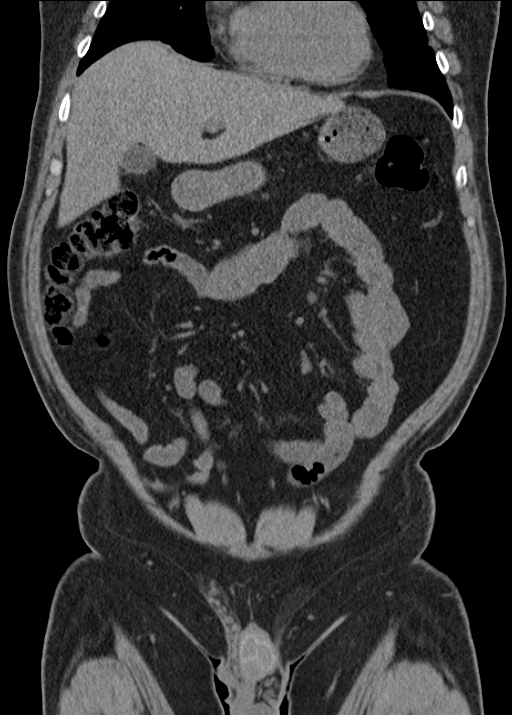
[im 48/107  soft-tissue]
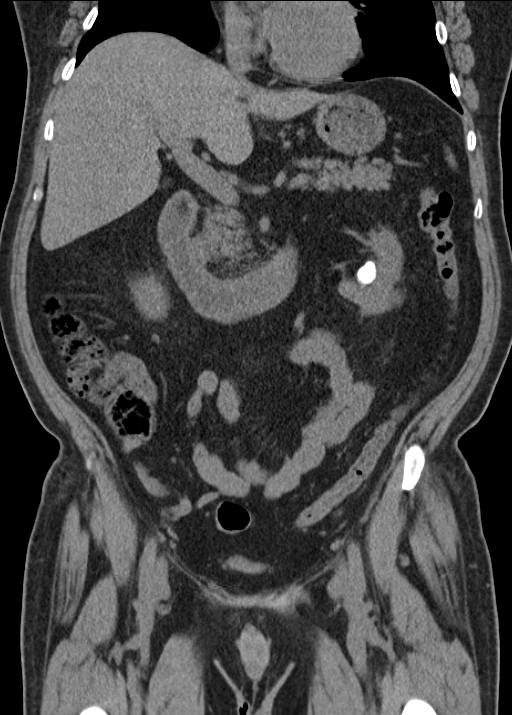
[im 59/107  soft-tissue]
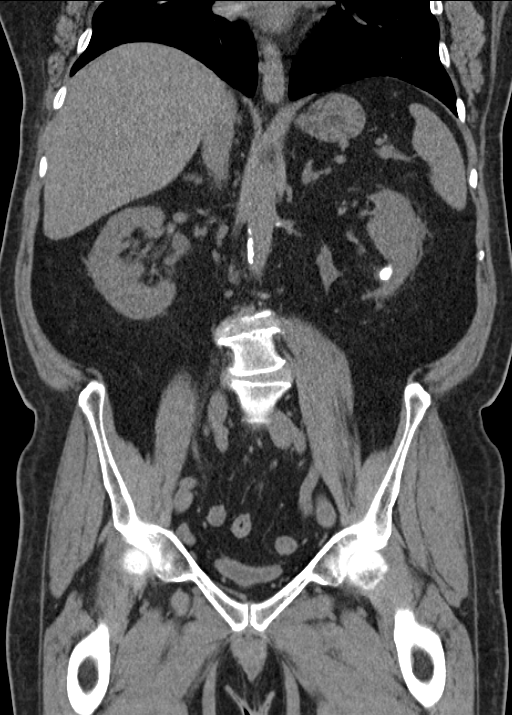

[16 of 37 positions shown; findings below may reference images not displayed]

FINDINGS: Lung bases are unremarkable. Sagittal images of the spine shows
degenerative changes lumbar spine at L3-L4 level. Again noted
branching in nephrolithiasis lower pole of the left kidney. Again
noted chronic mild cortical thinning left kidney. Minimal
hydronephrosis upper pole of the left kidney stable from prior exam.

No right nephrolithiasis. Bilateral no calcified ureteral calculi
are noted.

Atherosclerotic calcifications of abdominal aorta and iliac
arteries.

There is no pericecal inflammation. The terminal ileum is
unremarkable. No small bowel obstruction. No ascites or free air. No
adenopathy. Prostate gland and seminal vesicles are unremarkable. No
calcified calculi are noted within under distended urinary bladder.
Small nonspecific bilateral inguinal lymph nodes are noted.
IMPRESSION: 1. Again noted chronic left nephrolithiasis and chronic cortical
thinning left kidney. Stable minimal hydronephrosis upper pole of
the left kidney.
2. No calcified ureteral calculi are noted.
3. No small bowel obstruction.
4. Degenerative changes lumbar spine.
5. No pericecal inflammation.  Unremarkable terminal ileum.

## 2015-05-05 MED ORDER — DIAZEPAM 5 MG PO TABS: 5.0000 mg | ORAL_TABLET | Freq: Two times a day (BID) | ORAL | Status: DC

## 2015-05-05 MED ORDER — HYDROCODONE-ACETAMINOPHEN 5-325 MG PO TABS: 1.0000 | ORAL_TABLET | ORAL | Status: DC | PRN

## 2015-05-05 MED ORDER — DEXAMETHASONE SODIUM PHOSPHATE 10 MG/ML IJ SOLN
10.0000 mg | Freq: Once | INTRAMUSCULAR | Status: AC
  Administered 2015-05-05: 10 mg via INTRAVENOUS
  Filled 2015-05-05: qty 1

## 2015-05-05 MED ORDER — IBUPROFEN 800 MG PO TABS: 800.0000 mg | ORAL_TABLET | Freq: Three times a day (TID) | ORAL | Status: DC

## 2015-05-05 MED ORDER — KETOROLAC TROMETHAMINE 30 MG/ML IJ SOLN
30.0000 mg | Freq: Once | INTRAMUSCULAR | Status: AC
  Administered 2015-05-05: 30 mg via INTRAVENOUS
  Filled 2015-05-05: qty 1

## 2015-05-05 MED ORDER — DIAZEPAM 5 MG/ML IJ SOLN
5.0000 mg | Freq: Once | INTRAMUSCULAR | Status: AC
  Administered 2015-05-05: 5 mg via INTRAVENOUS
  Filled 2015-05-05: qty 2

## 2015-05-05 MED ORDER — METHOCARBAMOL 500 MG PO TABS: 500.0000 mg | ORAL_TABLET | Freq: Two times a day (BID) | ORAL | Status: DC

## 2015-05-05 NOTE — ED Notes (Signed)
Patient transported to CT 

## 2015-05-05 NOTE — ED Notes (Addendum)
Pt c/o lower back pain on the left side. Pain began about 1 week ago. Pain with movement. Pt denies trouble urinating, or blood in urine. Surgery on kidneys in 2015, for kidney stones. LBM this am.

## 2015-05-05 NOTE — ED Provider Notes (Signed)
CSN: [id number]     Arrival date & time 05/05/15  1445 History   First MD Initiated Contact with Patient 05/05/15 1722     Chief Complaint  Patient presents with  . Back Pain    lower left side     (Consider location/radiation/quality/duration/timing/severity/associated sxs/prior Treatment) HPI   Pt is a 59 year-old male with hx of kidney stone with lithotripsy/uretral stents, HTN, and arthritis, who presents to [facility] ED with complaints of roughly 1 week of left low back and left buttock pain that is sharp with an electrical quality, with radiation up his back that "jolts" him, pain rated 9/10.  It is exacerbated with any movement and with some palpation, and it is associated with a change in his gait, which he describes as a slow shuffle due to pain.  He has tried flexeril and baclofen without any improvement.  Over the last two days he had rapid increase in the severity of his pain with muscle tightness and spasms.  He denies any trauma, prior injury, or incident where he lifted heavy objects.  He denies any numbness, tingling, loss of bladder or bowel movement, or saddle anesthesia.  He also denies any hematuria, dysuria, change in frequency or urine volume.  He had an episode a few days ago where he felt dizzy, described by pt as "vertigo" with associated nausea.  He denies any tremor, weakness, syncope, fever, chills, diaphoresis, fatigue, SOB, CP.   Past Medical History  Diagnosis Date  . Kidney stone   . Hypertension   . Arthritis   . Peripheral neuropathy     left fifth finger, left toe  . Pneumonia     hx of  . Diabetes mellitus     fasting 120s  . Kidney stones    Past Surgical History  Procedure Laterality Date  . Lithotripsy    . Uretral stent    . Cystoscopy with retrograde pyelogram, ureteroscopy and stent placement Bilateral 02/01/2014    Procedure: CYSTOSCOPY WITH BILATERAL RETROGRADE CYSTOSCOPY, BILATERAL RETROGRADE, PYELOGRAM, RIGHT URETEROSCOPY, STONE BASKETRY,  AND  BILATERAL STENT PLACEMENT;  Surgeon: Alexis Frock, MD;  Location: [facility];  Service: Urology;  Laterality: Bilateral;  . Appendectomy    . Nephrolithotomy Left 03/17/2014    Procedure: NEPHROLITHOTOMY PERCUTANEOUS with access;  Surgeon: Alexis Frock, MD;  Location: [facility];  Service: Urology;  Laterality: Left;  . Cystoscopy w/ ureteral stent placement Bilateral 03/17/2014    Procedure: CYSTOSCOPY WITH RIGHT STENT PULL  ;  Surgeon: Alexis Frock, MD;  Location: [facility];  Service: Urology;  Laterality: Bilateral;  . Nephrolithotomy Left 03/19/2014    Procedure: 2ND STAGE NEPHROLITHOTOMY PERCUTANEOUS left ureteroscopy with lasr stone treatment and stent placement, with access;  Surgeon: Alexis Frock, MD;  Location: [facility];  Service: Urology;  Laterality: Left;  . Holmium laser application Left 01/07/2439    Procedure: HOLMIUM LASER APPLICATION;  Surgeon: Alexis Frock, MD;  Location: [facility];  Service: Urology;  Laterality: Left;  . Total knee arthroplasty Right 09/02/2014    Procedure: RIGHT TOTAL KNEE ARTHROPLASTY;  Surgeon: Meredith Pel, MD;  Location: [place];  Service: Orthopedics;  Laterality: Right;   No family history on file. Social History  Substance Use Topics  . Smoking status: Never Smoker   . Smokeless tobacco: Never Used  . Alcohol Use: 1.2 oz/week    2 Cans of beer per week     Comment: occasional/ 1-2 beers week    Review of Systems  Constitutional: Negative.  HENT: Negative.   Eyes: Negative.   Respiratory: Negative.   Cardiovascular: Negative.   Gastrointestinal: Negative.   Genitourinary: Negative.   Musculoskeletal: Positive for back pain, arthralgias and gait problem. Negative for myalgias, joint swelling, neck pain and neck stiffness.  Skin: Negative.   Hematological: Negative.   Psychiatric/Behavioral: Negative.        Allergies  Review of patient's allergies indicates no known allergies.  Home Medications   Prior to Admission medications     Medication Sig Start Date End Date Taking? Authorizing Provider  amLODipine (NORVASC) 2.5 MG tablet Take 2.5 mg by mouth every morning.    Yes Historical Provider, MD  Ascorbic Acid (VITAMIN C) 1000 MG tablet Take 1,000 mg by mouth daily.   Yes Historical Provider, MD  atorvastatin (LIPITOR) 10 MG tablet Take 10 mg by mouth every evening.   Yes Historical Provider, MD  cholecalciferol (VITAMIN D) 1000 UNITS tablet Take 1,000 Units by mouth daily.   Yes Historical Provider, MD  Cyanocobalamin (VITAMIN B-12) 2500 MCG SUBL Place 1 tablet under the tongue daily.   Yes Historical Provider, MD  gabapentin (NEURONTIN) 100 MG capsule Take 300 mg by mouth 3 (three) times daily.    Yes Historical Provider, MD  Ginkgo Biloba 120 MG CAPS Take 2 capsules by mouth daily.   Yes Historical Provider, MD  insulin glargine (LANTUS) 100 UNIT/ML injection Inject 30 Units into the skin at bedtime.    Yes Historical Provider, MD  lisinopril (PRINIVIL,ZESTRIL) 40 MG tablet Take 40 mg by mouth every morning.    Yes Historical Provider, MD  metFORMIN (GLUCOPHAGE) 500 MG tablet Take 1,000 mg by mouth 2 (two) times daily with a meal.    Yes Historical Provider, MD  Multiple Vitamins-Minerals (MULTIVITAMINS THER. W/MINERALS) TABS Take 1 tablet by mouth every morning.    Yes Historical Provider, MD  Omega-3 Fatty Acids (FISH OIL) 1200 MG CAPS Take 1 capsule by mouth daily.   Yes Historical Provider, MD  pioglitazone (ACTOS) 15 MG tablet Take 15 mg by mouth daily.   Yes Historical Provider, MD  diazepam (VALIUM) 5 MG tablet Take 1 tablet (5 mg total) by mouth 2 (two) times daily. 05/05/15   Delsa Grana, PA-C  HYDROmorphone (DILAUDID) 2 MG tablet Take 1 tablet (2 mg total) by mouth every 4 (four) hours as needed for moderate pain or severe pain. Post-operatively Patient not taking: Reported on 12/31/2014 09/04/14   Meredith Pel, MD  ibuprofen (ADVIL,MOTRIN) 800 MG tablet Take 1 tablet (800 mg total) by mouth 3 (three) times  daily. 05/05/15   Delsa Grana, PA-C  methocarbamol (ROBAXIN) 500 MG tablet Take 1 tablet (500 mg total) by mouth 2 (two) times daily. 05/05/15   Delsa Grana, PA-C  oxybutynin (DITROPAN) 5 MG tablet Take 1 tablet (5 mg total) by mouth every 8 (eight) hours as needed for bladder spasms. / stent discomfort Patient not taking: Reported on 05/05/2015 02/01/14   Alexis Frock, MD  oxyCODONE-acetaminophen (PERCOCET) 5-325 MG per tablet Take 1 tablet by mouth every 4 (four) hours as needed. Patient not taking: Reported on 05/05/2015 03/05/15   Noemi Chapel, MD  promethazine (PHENERGAN) 25 MG tablet Take 1 tablet (25 mg total) by mouth every 6 (six) hours as needed for nausea or vomiting. Patient not taking: Reported on 05/05/2015 03/05/15   Noemi Chapel, MD  senna-docusate (SENOKOT-S) 8.6-50 MG per tablet Take 1 tablet by mouth 2 (two) times daily. While taking pain meds to prevent constipation Patient not  taking: Reported on 08/25/2014 03/21/14   Alexis Frock, MD  tamsulosin (FLOMAX) 0.4 MG CAPS capsule Take 1 capsule (0.4 mg total) by mouth 2 (two) times daily. Patient not taking: Reported on 05/05/2015 03/05/15   Noemi Chapel, MD   BP 145/84 mmHg  Pulse 67  Temp(Src) 97.6 F (36.4 C) (Oral)  Resp 19  SpO2 97% Physical Exam  Constitutional: He is oriented to person, place, and time. He appears well-developed and well-nourished. No distress.  HENT:  Head: Normocephalic and atraumatic.  Nose: Nose normal.  Mouth/Throat: Oropharynx is clear and moist. No oropharyngeal exudate.  Eyes: Conjunctivae and EOM are normal. Pupils are equal, round, and reactive to light. Right eye exhibits no discharge. Left eye exhibits no discharge. No scleral icterus.  Neck: Normal range of motion. No JVD present. No tracheal deviation present. No thyromegaly present.  Cardiovascular: Normal rate, regular rhythm, normal heart sounds and intact distal pulses.  Exam reveals no gallop and no friction rub.   No murmur  heard. Pulmonary/Chest: Effort normal and breath sounds normal. No respiratory distress. He has no wheezes. He has no rales. He exhibits no tenderness.  Abdominal: Soft. Bowel sounds are normal. He exhibits no distension and no mass. There is no tenderness. There is no rebound and no guarding.  Musculoskeletal: He exhibits no edema or tenderness.       Right hip: He exhibits decreased range of motion. He exhibits no tenderness, no bony tenderness, no swelling and no crepitus.       Left hip: He exhibits decreased range of motion. He exhibits no tenderness, no bony tenderness, no swelling and no crepitus.       Right knee: Normal.       Left knee: Normal.       Arms: Exam limited by pain Tender palpation left SI joint, no spinal tenderness from cervical to lumbar spine, no paraspinal tenderness to cervical or thoracic, left lumbar paraspinal tenderness to palpation   Lymphadenopathy:    He has no cervical adenopathy.  Neurological: He is alert and oriented to person, place, and time. He has normal reflexes. He is not disoriented. He displays no atrophy and no tremor. No cranial nerve deficit or sensory deficit. He exhibits normal muscle tone. He displays no seizure activity. Gait abnormal. Coordination normal.  Sensation to light touch intact, antalgic gait Symmetrical strength in upper and lower extremities with grip strength and dorsiflexion, plantarflexion.     Skin: Skin is warm and dry. No rash noted. He is not diaphoretic. No erythema. No pallor.  Psychiatric: He has a normal mood and affect. His behavior is normal. Judgment and thought content normal.  Nursing note and vitals reviewed.   ED Course  Procedures (including critical care time) Labs Review Labs Reviewed  URINALYSIS, ROUTINE W REFLEX MICROSCOPIC (NOT AT [facility]) - Abnormal; Notable for the following:    APPearance CLOUDY (*)    Glucose, UA 100 (*)    Hgb urine dipstick TRACE (*)    Leukocytes, UA SMALL (*)    All other  components within normal limits  COMPREHENSIVE METABOLIC PANEL - Abnormal; Notable for the following:    Glucose, Bld 128 (*)    All other components within normal limits  LIPASE, BLOOD - Abnormal; Notable for the following:    Lipase 14 (*)    All other components within normal limits  CBC WITH DIFFERENTIAL/PLATELET - Abnormal; Notable for the following:    WBC 10.8 (*)    RBC 4.19 (*)  Hemoglobin 12.9 (*)    HCT 38.8 (*)    Lymphs Abs 4.7 (*)    All other components within normal limits  URINE MICROSCOPIC-ADD ON    Imaging Review No results found.   EKG Interpretation None      MDM   Final diagnoses:  Left low back pain  History of kidney stones   Left low back and buttock pain - worse with palpation and movement, muscle stiff CT renal stone to r/o any kidney stone pathology, given pt's hx, and left flank/left low back pain, low threshold to insure no acute change for him given his hx.  UA negative, mild leukocytosis, anemia.  Ct showed stable minimal hydronephrosis on L with chronic L nephrolithiasis and degenerative changes in lumbar spine.  No neurological deficits and normal neuro exam.  Patient can walk but states is painful.  No loss of bowel or bladder control.  No concern for cauda equina.  No fever, night sweats, weight loss, h/o cancer, IVDU.  RICE protocol and pain medicine indicated and discussed with patient.  Pt seen with Dr. Dayna Barker who felt that a short rx of valium is appropriate given pain and muscle spasm.  Pt agrees to f/up with his PCP tomorrow.       Delsa Grana, PA-C 05/15/15 0981  Merrily Pew, MD 05/15/15 [phone]

## 2015-05-05 NOTE — ED Provider Notes (Signed)
Medical screening examination/treatment/procedure(s) were conducted as a shared visit with non-physician practitioner(s) and myself.  I personally evaluated the patient during the encounter.  59 year old male with likely muscular skeletal back pain. History of kidney stones but not similar to that however her CT was done to evaluate and was negative. It was improved with Valium. No signs or symptoms consistent with cauda equina. Doubt meningitis or osteomyelitis without fever. We'll treat symptomatically at home.   EKG Interpretation None        Merrily Pew, MD 05/07/15 [phone]

## 2015-05-05 NOTE — ED Notes (Signed)
Informed the pt that a urine specimen is needed. 

## 2015-05-05 NOTE — Discharge Instructions (Signed)
Back Pain, Adult Low back pain is very common. About 1 in 5 people have back pain.The cause of low back pain is rarely dangerous. The pain often gets better over time.About half of people with a sudden onset of back pain feel better in just 2 weeks. About 8 in 10 people feel better by 6 weeks.  CAUSES Some common causes of back pain include:  Strain of the muscles or ligaments supporting the spine.  Wear and tear (degeneration) of the spinal discs.  Arthritis.  Direct injury to the back. DIAGNOSIS Most of the time, the direct cause of low back pain is not known.However, back pain can be treated effectively even when the exact cause of the pain is unknown.Answering your caregiver's questions about your overall health and symptoms is one of the most accurate ways to make sure the cause of your pain is not dangerous. If your caregiver needs more information, he or she may order lab work or imaging tests (X-rays or MRIs).However, even if imaging tests show changes in your back, this usually does not require surgery. HOME CARE INSTRUCTIONS For many people, back pain returns.Since low back pain is rarely dangerous, it is often a condition that people can learn to manageon their own.   Remain active. It is stressful on the back to sit or stand in one place. Do not sit, drive, or stand in one place for more than 30 minutes at a time. Take short walks on level surfaces as soon as pain allows.Try to increase the length of time you walk each day.  Do not stay in bed.Resting more than 1 or 2 days can delay your recovery.  Do not avoid exercise or work.Your body is made to move.It is not dangerous to be active, even though your back may hurt.Your back will likely heal faster if you return to being active before your pain is gone.  Pay attention to your body when you bend and lift. Many people have less discomfortwhen lifting if they bend their knees, keep the load close to their bodies,and  avoid twisting. Often, the most comfortable positions are those that put less stress on your recovering back.  Find a comfortable position to sleep. Use a firm mattress and lie on your side with your knees slightly bent. If you lie on your back, put a pillow under your knees.  Only take over-the-counter or prescription medicines as directed by your caregiver. Over-the-counter medicines to reduce pain and inflammation are often the most helpful.Your caregiver may prescribe muscle relaxant drugs.These medicines help dull your pain so you can more quickly return to your normal activities and healthy exercise.  Put ice on the injured area.  Put ice in a plastic bag.  Place a towel between your skin and the bag.  Leave the ice on for 15-20 minutes, 03-04 times a day for the first 2 to 3 days. After that, ice and heat may be alternated to reduce pain and spasms.  Ask your caregiver about trying back exercises and gentle massage. This may be of some benefit.  Avoid feeling anxious or stressed.Stress increases muscle tension and can worsen back pain.It is important to recognize when you are anxious or stressed and learn ways to manage it.Exercise is a great option. SEEK MEDICAL CARE IF:  You have pain that is not relieved with rest or medicine.  You have pain that does not improve in 1 week.  You have new symptoms.  You are generally not feeling well. SEEK   IMMEDIATE MEDICAL CARE IF:   You have pain that radiates from your back into your legs.  You develop new bowel or bladder control problems.  You have unusual weakness or numbness in your arms or legs.  You develop nausea or vomiting.  You develop abdominal pain.  You feel faint. Document Released: 09/12/2005 Document Revised: 03/13/2012 Document Reviewed: 01/14/2014 [employer] Patient Information 2015 [employer]. This information is not intended to replace advice given to you by your health care provider. Make sure you  discuss any questions you have with your health care provider.  

## 2015-05-05 NOTE — ED Notes (Signed)
Pt states he has a ride home

## 2015-09-04 ENCOUNTER — Other Ambulatory Visit: Payer: Self-pay | Admitting: Orthopedic Surgery

## 2015-09-04 DIAGNOSIS — M545 Low back pain: Secondary | ICD-10-CM

## 2015-09-16 ENCOUNTER — Other Ambulatory Visit: Payer: BLUE CROSS/BLUE SHIELD

## 2017-01-07 ENCOUNTER — Other Ambulatory Visit (INDEPENDENT_AMBULATORY_CARE_PROVIDER_SITE_OTHER): Payer: Self-pay | Admitting: Orthopedic Surgery

## 2017-01-09 NOTE — Telephone Encounter (Signed)
Rx requests 

## 2017-01-09 NOTE — Telephone Encounter (Signed)
Okay to refill please call thanks

## 2017-01-10 ENCOUNTER — Other Ambulatory Visit (INDEPENDENT_AMBULATORY_CARE_PROVIDER_SITE_OTHER): Payer: Self-pay | Admitting: Radiology

## 2017-01-10 MED ORDER — CYCLOBENZAPRINE HCL 10 MG PO TABS: 10.0000 mg | ORAL_TABLET | Freq: Two times a day (BID) | ORAL | 0 refills | Status: DC

## 2017-01-10 MED ORDER — MELOXICAM 15 MG PO TABS: 15.0000 mg | ORAL_TABLET | Freq: Every day | ORAL | 0 refills | Status: DC

## 2017-01-11 NOTE — Telephone Encounter (Signed)
y

## 2017-01-11 NOTE — Telephone Encounter (Signed)
Ok to rf? 

## 2017-07-10 ENCOUNTER — Other Ambulatory Visit (INDEPENDENT_AMBULATORY_CARE_PROVIDER_SITE_OTHER): Payer: Self-pay | Admitting: Orthopedic Surgery

## 2017-07-10 NOTE — Telephone Encounter (Signed)
Ok to rf both of these medications?

## 2017-07-10 NOTE — Telephone Encounter (Signed)
Y we'll need follow-up prior to any further refills please call

## 2017-09-21 ENCOUNTER — Telehealth (INDEPENDENT_AMBULATORY_CARE_PROVIDER_SITE_OTHER): Payer: Self-pay | Admitting: Radiology

## 2017-09-21 NOTE — Telephone Encounter (Signed)
Patient calling triage today, complaining of unbearable back pain. States he is crawling around to get around his home. Offered him an appointment with Benjiman Core today, he declined since he is in [place] currently and wont be home until later tonight. He is going to ER and would like to follow up with Dr. Marlou Sa after the new year.

## 2017-11-14 ENCOUNTER — Other Ambulatory Visit (INDEPENDENT_AMBULATORY_CARE_PROVIDER_SITE_OTHER): Payer: Self-pay | Admitting: Orthopedic Surgery

## 2017-11-14 NOTE — Telephone Encounter (Signed)
Ok to rf? Last seen 2016. Please advise. Thanks.

## 2017-11-15 NOTE — Telephone Encounter (Signed)
n

## 2018-02-26 ENCOUNTER — Ambulatory Visit (INDEPENDENT_AMBULATORY_CARE_PROVIDER_SITE_OTHER): Payer: Self-pay | Admitting: Orthopedic Surgery

## 2018-07-16 ENCOUNTER — Emergency Department (HOSPITAL_COMMUNITY): Payer: Self-pay

## 2018-07-16 ENCOUNTER — Emergency Department (HOSPITAL_COMMUNITY)
Admission: EM | Admit: 2018-07-16 | Discharge: 2018-07-16 | Disposition: A | Discharge status: Home / Self Care | Payer: Self-pay | Attending: Emergency Medicine | Admitting: Emergency Medicine

## 2018-07-16 ENCOUNTER — Other Ambulatory Visit: Payer: Self-pay

## 2018-07-16 ENCOUNTER — Encounter (HOSPITAL_COMMUNITY): Payer: Self-pay

## 2018-07-16 DIAGNOSIS — I10 Essential (primary) hypertension: Secondary | ICD-10-CM | POA: Insufficient documentation

## 2018-07-16 DIAGNOSIS — Y9389 Activity, other specified: Secondary | ICD-10-CM | POA: Insufficient documentation

## 2018-07-16 DIAGNOSIS — Y92007 Garden or yard of unspecified non-institutional (private) residence as the place of occurrence of the external cause: Secondary | ICD-10-CM | POA: Insufficient documentation

## 2018-07-16 DIAGNOSIS — Y998 Other external cause status: Secondary | ICD-10-CM | POA: Insufficient documentation

## 2018-07-16 DIAGNOSIS — W010XXA Fall on same level from slipping, tripping and stumbling without subsequent striking against object, initial encounter: Secondary | ICD-10-CM | POA: Insufficient documentation

## 2018-07-16 DIAGNOSIS — S2242XA Multiple fractures of ribs, left side, initial encounter for closed fracture: Secondary | ICD-10-CM | POA: Insufficient documentation

## 2018-07-16 IMAGING — CR DG RIBS W/ CHEST 3+V*L*
4 series · 4 of 4 positions shown · non-contrast
Comparison: Chest x-ray dated [DATE]

CLINICAL DATA: Left anterior chest pain secondary to a fall 3 days
ago.

EXAM:
LEFT RIBS AND CHEST - 3+ VIEW

[w chest pa]
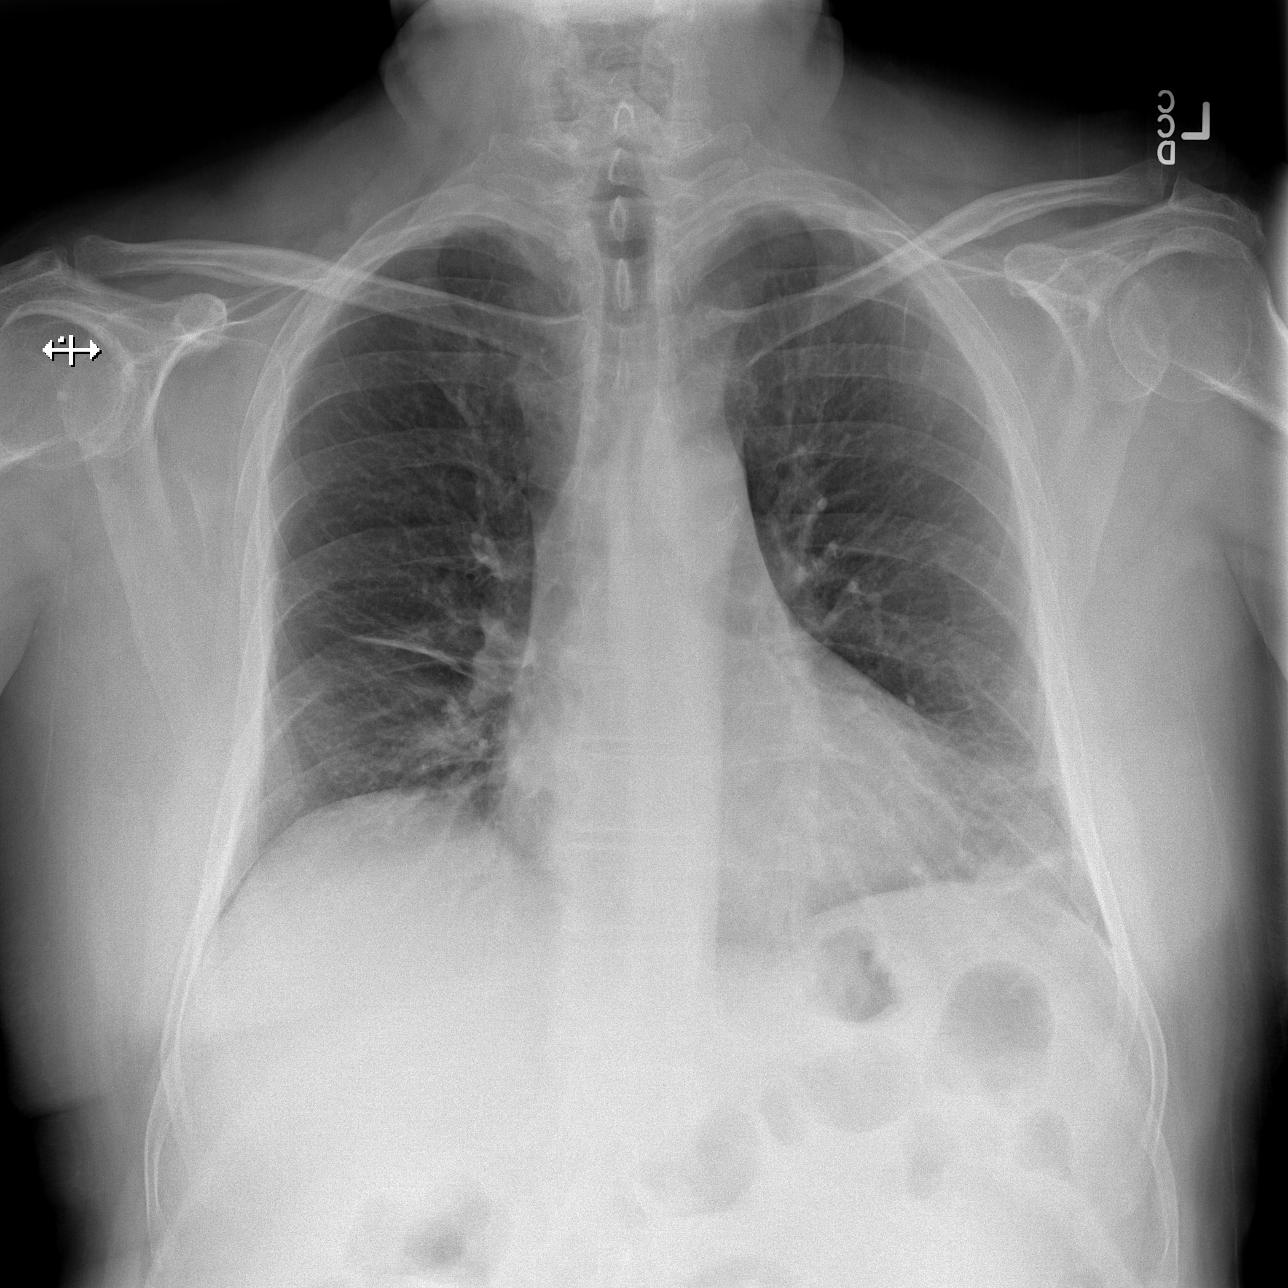

[w ribs ap lower left (1 of 2)]
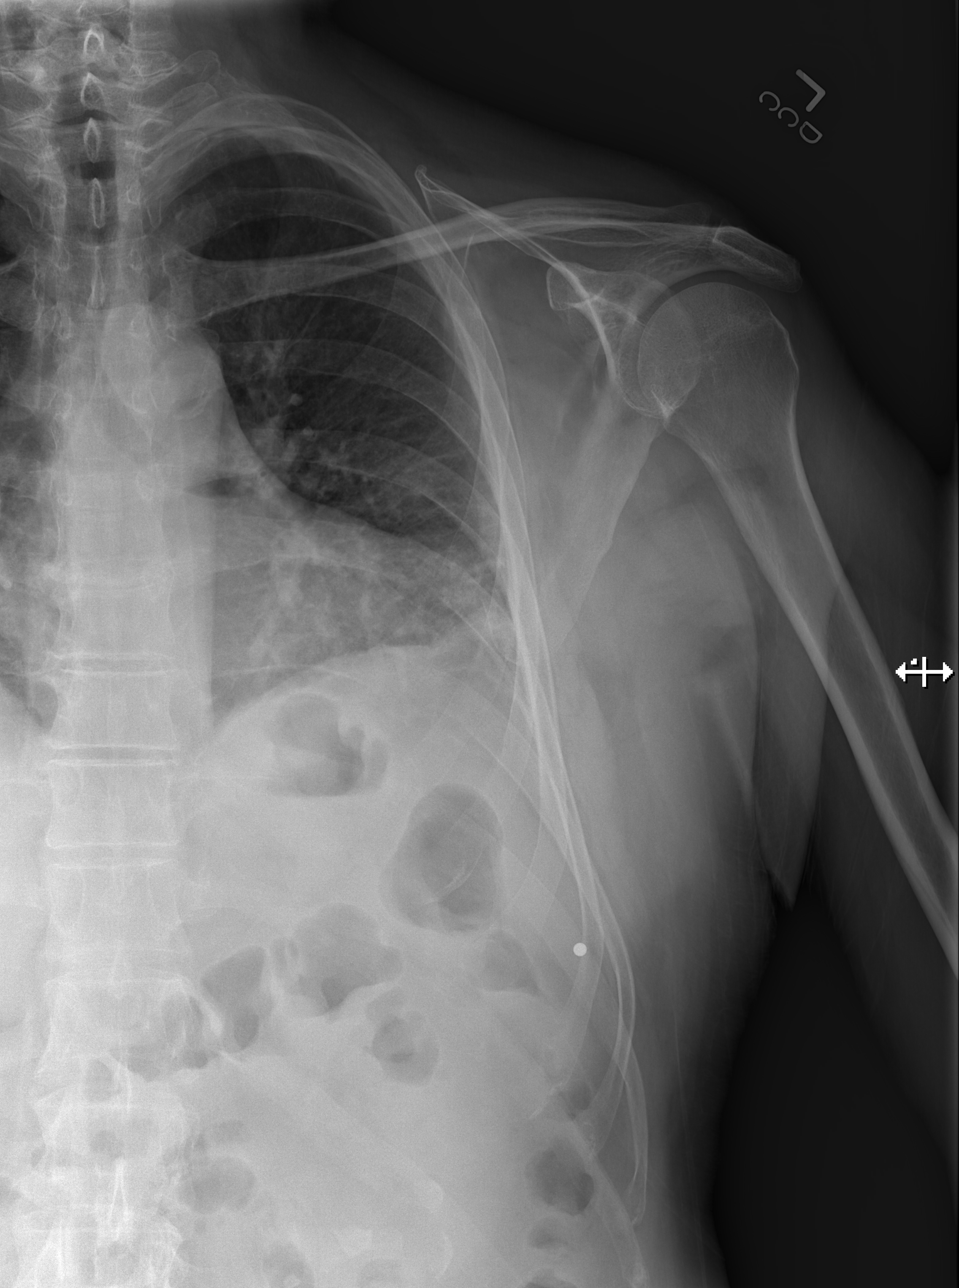

[w ribs ap lower left (2 of 2)]
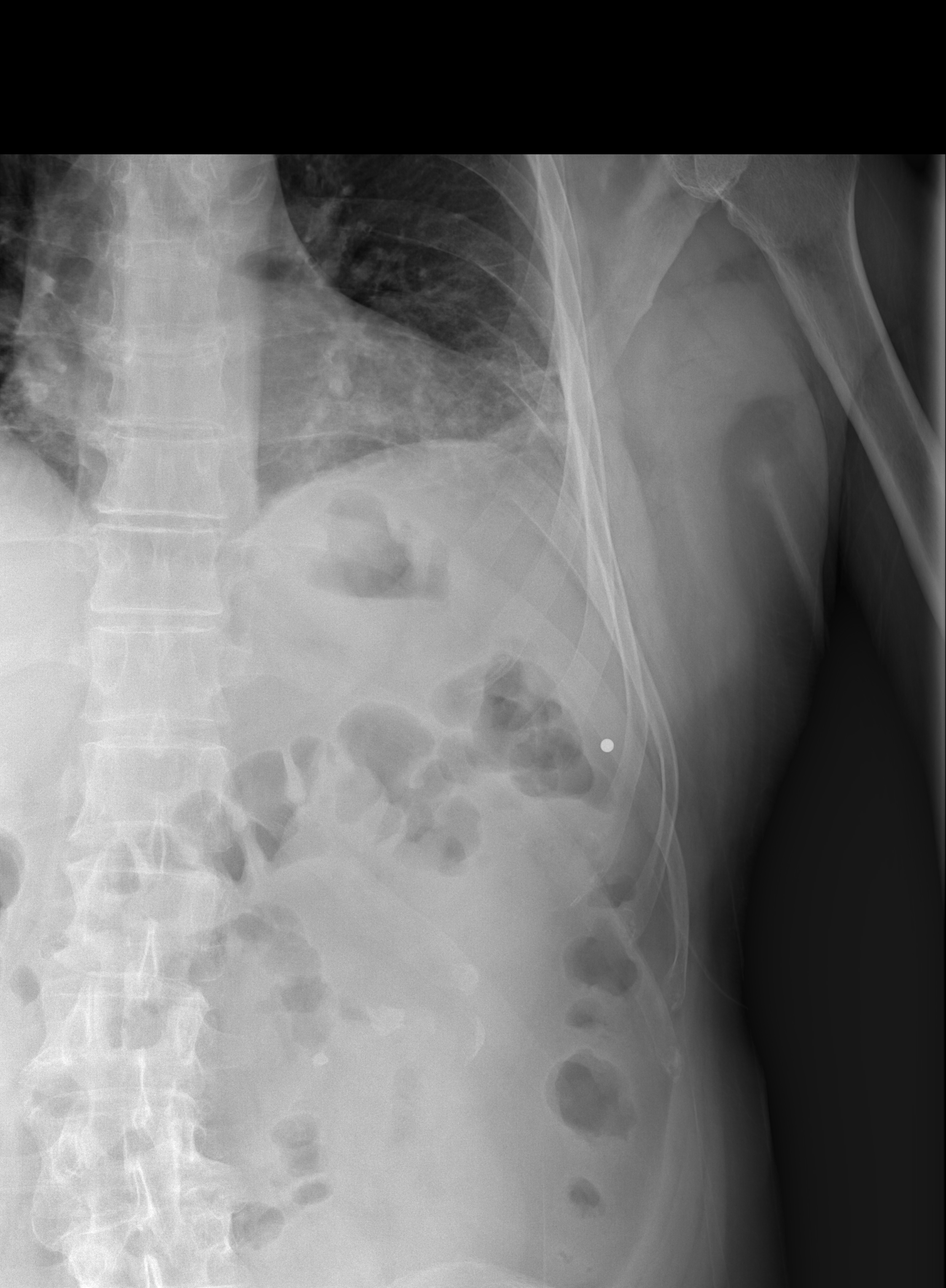

[w ribs obl left]
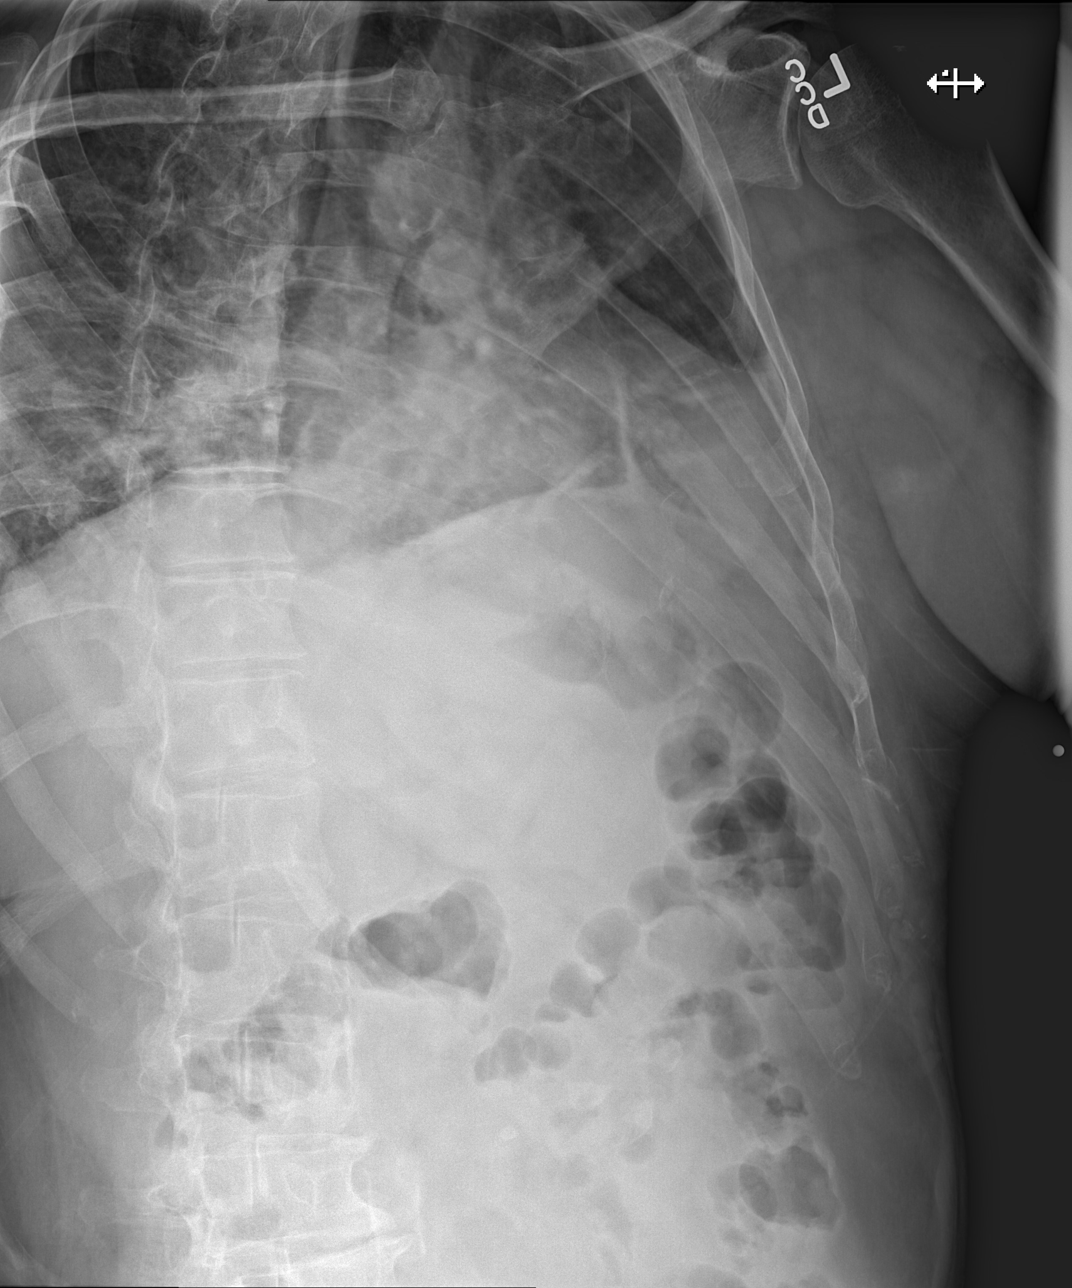

[4 of 4 positions shown; findings below may reference images not displayed]

FINDINGS: There are subtle fractures of the anterolateral aspects of the left
fifth, sixth, and seventh ribs.

Heart size and pulmonary vascularity are normal. Slight scarring at
the left lung base laterally. Slight thickening of the minor fissure
on the right, chronic. No pneumothorax or pleural effusion.
IMPRESSION: Nondisplaced fractures of the anterolateral aspects of the left
fifth, sixth and seventh ribs.

## 2018-07-16 MED ORDER — OXYCODONE-ACETAMINOPHEN 5-325 MG PO TABS
1.0000 | ORAL_TABLET | Freq: Once | ORAL | Status: AC
  Administered 2018-07-16: 1 via ORAL
  Filled 2018-07-16: qty 1

## 2018-07-16 MED ORDER — OXYCODONE-ACETAMINOPHEN 5-325 MG PO TABS: 1.0000 | ORAL_TABLET | Freq: Four times a day (QID) | ORAL | 0 refills | Status: DC | PRN

## 2018-07-16 NOTE — Discharge Instructions (Addendum)
Keep taking ibuprofen 600 mg every 6 hours as needed for pain. Take prescribed pain medication for break through pain. I would encourage regular use of the incentive spirometer.

## 2018-07-16 NOTE — ED Notes (Signed)
Pt instructed and demonstrated use of incentive spirometry. Able to measure 2243ml on inspiration and knows to repeat every 2 hours while awake

## 2018-07-16 NOTE — ED Provider Notes (Signed)
[facility] DEPT Provider Note   CSN: [id number] Arrival date & time: 07/16/18  4098     History   Chief Complaint Chief Complaint  Patient presents with  . Fall  . rib cage pain    HPI [name] is a 62 y.o. male.  HPI   62 year old male with anterior to the lateral left chest pain.  He slipped on wet grass when playing with his dog in the backyard 3 days ago.  He has had severe persistent pain since then.  Presented today status and started began to feel any better.  Pain at rest and sniffily worse with movement, deep breathing and coughing.  Strike taking ibuprofen with minimal improvement.  Past Medical History:  Diagnosis Date  . Arthritis   . Diabetes mellitus    fasting 120s  . Hypertension   . Kidney stone   . Kidney stones   . Peripheral neuropathy    left fifth finger, left toe  . Pneumonia    hx of    Patient Active Problem List   Diagnosis Date Noted  . Arthritis of knee 09/02/2014  . 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 Nephrolithiasis 02/01/2014  . Pneumonia 08/27/2011  . Diabetes mellitus 08/27/2011    Past Surgical History:  Procedure Laterality Date  . APPENDECTOMY    . CYSTOSCOPY W/ URETERAL STENT PLACEMENT Bilateral 03/17/2014   Procedure: CYSTOSCOPY WITH RIGHT STENT PULL  ;  Surgeon: Alexis Frock, MD;  Location: [facility];  Service: Urology;  Laterality: Bilateral;  . CYSTOSCOPY WITH RETROGRADE PYELOGRAM, URETEROSCOPY AND STENT PLACEMENT Bilateral 02/01/2014   Procedure: CYSTOSCOPY WITH BILATERAL RETROGRADE CYSTOSCOPY, BILATERAL RETROGRADE, PYELOGRAM, RIGHT URETEROSCOPY, STONE BASKETRY,  AND BILATERAL STENT PLACEMENT;  Surgeon: Alexis Frock, MD;  Location: [facility];  Service: Urology;  Laterality: Bilateral;  . HOLMIUM LASER APPLICATION Left 10/14/1476   Procedure: HOLMIUM LASER APPLICATION;  Surgeon: Alexis Frock, MD;  Location: [facility];  Service: Urology;  Laterality: Left;  . LITHOTRIPSY    .  NEPHROLITHOTOMY Left 03/17/2014   Procedure: NEPHROLITHOTOMY PERCUTANEOUS with access;  Surgeon: Alexis Frock, MD;  Location: [facility];  Service: Urology;  Laterality: Left;  . NEPHROLITHOTOMY Left 03/19/2014   Procedure: 2ND STAGE NEPHROLITHOTOMY PERCUTANEOUS left ureteroscopy with lasr stone treatment and stent placement, with access;  Surgeon: Alexis Frock, MD;  Location: [facility];  Service: Urology;  Laterality: Left;  . REPLACEMENT TOTAL KNEE Right   . TOTAL KNEE ARTHROPLASTY Right 09/02/2014   Procedure: RIGHT TOTAL KNEE ARTHROPLASTY;  Surgeon: Meredith Pel, MD;  Location: [facility] [place];  Service: Orthopedics;  Laterality: Right;  . uretral stent          Home Medications    Prior to Admission medications   Medication Sig Start Date End Date Taking? Authorizing Provider  amLODipine (NORVASC) 2.5 MG tablet Take 2.5 mg by mouth every morning.     [provider]  Ascorbic Acid (VITAMIN C) 1000 MG tablet Take 1,000 mg by mouth daily.    [provider]  atorvastatin (LIPITOR) 10 MG tablet Take 10 mg by mouth every evening.    [provider]  cholecalciferol (VITAMIN D) 1000 UNITS tablet Take 1,000 Units by mouth daily.    [provider]  Cyanocobalamin (VITAMIN B-12) 2500 MCG SUBL Place 1 tablet under the tongue daily.    [provider]  cyclobenzaprine (FLEXERIL) 10 MG tablet TAKE 1 TABLET BY MOUTH TWICE DAILY 07/10/17   Meredith Pel, MD  diazepam (  VALIUM) 5 MG tablet Take 1 tablet (5 mg total) by mouth 2 (two) times daily. 05/05/15   Delsa Grana, PA-C  gabapentin (NEURONTIN) 100 MG capsule Take 300 mg by mouth 3 (three) times daily.     [provider]  Ginkgo Biloba 120 MG CAPS Take 2 capsules by mouth daily.    [provider]  HYDROmorphone (DILAUDID) 2 MG tablet Take 1 tablet (2 mg total) by mouth every 4 (four) hours as needed for moderate pain or severe pain. Post-operatively Patient not taking: Reported on  12/31/2014 09/04/14   Meredith Pel, MD  ibuprofen (ADVIL,MOTRIN) 800 MG tablet Take 1 tablet (800 mg total) by mouth 3 (three) times daily. 05/05/15   Delsa Grana, PA-C  insulin glargine (LANTUS) 100 UNIT/ML injection Inject 30 Units into the skin at bedtime.     [provider]  lisinopril (PRINIVIL,ZESTRIL) 40 MG tablet Take 40 mg by mouth every morning.     [provider]  meloxicam (MOBIC) 15 MG tablet TAKE 1 TABLET BY MOUTH ONCE DAILY 07/10/17   Meredith Pel, MD  metFORMIN (GLUCOPHAGE) 500 MG tablet Take 1,000 mg by mouth 2 (two) times daily with a meal.     [provider]  methocarbamol (ROBAXIN) 500 MG tablet Take 1 tablet (500 mg total) by mouth 2 (two) times daily. 05/05/15   Delsa Grana, PA-C  Multiple Vitamins-Minerals (MULTIVITAMINS THER. W/MINERALS) TABS Take 1 tablet by mouth every morning.     [provider]  Omega-3 Fatty Acids (FISH OIL) 1200 MG CAPS Take 1 capsule by mouth daily.    [provider]  oxybutynin (DITROPAN) 5 MG tablet Take 1 tablet (5 mg total) by mouth every 8 (eight) hours as needed for bladder spasms. / stent discomfort Patient not taking: Reported on 05/05/2015 02/01/14   Alexis Frock, MD  oxyCODONE-acetaminophen (PERCOCET) 5-325 MG per tablet Take 1 tablet by mouth every 4 (four) hours as needed. Patient not taking: Reported on 05/05/2015 03/05/15   Noemi Chapel, MD  pioglitazone (ACTOS) 15 MG tablet Take 15 mg by mouth daily.    [provider]  promethazine (PHENERGAN) 25 MG tablet Take 1 tablet (25 mg total) by mouth every 6 (six) hours as needed for nausea or vomiting. Patient not taking: Reported on 05/05/2015 03/05/15   Noemi Chapel, MD  senna-docusate (SENOKOT-S) 8.6-50 MG per tablet Take 1 tablet by mouth 2 (two) times daily. While taking pain meds to prevent constipation Patient not taking: Reported on 08/25/2014 03/21/14   Alexis Frock, MD  tamsulosin (FLOMAX) 0.4 MG CAPS capsule Take 1  capsule (0.4 mg total) by mouth 2 (two) times daily. Patient not taking: Reported on 05/05/2015 03/05/15   Noemi Chapel, MD    Family History History reviewed. No pertinent family history.  Social History Social History   Tobacco Use  . Smoking status: Never Smoker  . Smokeless tobacco: Never Used  Substance Use Topics  . Alcohol use: Yes    Alcohol/week: 2.0 standard drinks    Types: 2 Cans of beer per week    Comment: occasional/ 1-2 beers week  . Drug use: No     Allergies   Patient has no known allergies.   Review of Systems Review of Systems  All systems reviewed and negative, other than as noted in HPI.  Physical Exam Updated Vital Signs BP 140/88 (BP Location: Left Arm)   Pulse 69   Temp 98.1 F (36.7 C) (Oral)   Resp 18  Ht 5\' 9"  (1.753 m)   Wt 90.7 kg   SpO2 98%   BMI 29.53 kg/m   Physical Exam  Constitutional: He appears well-developed and well-nourished. No distress.  HENT:  Head: Normocephalic and atraumatic.  Eyes: Conjunctivae are normal. Right eye exhibits no discharge. Left eye exhibits no discharge.  Neck: Neck supple.  Cardiovascular: Normal rate, regular rhythm and normal heart sounds. Exam reveals no gallop and no friction rub.  No murmur heard. Pulmonary/Chest: Effort normal and breath sounds normal. No respiratory distress.  Abdominal: Soft. He exhibits no distension. There is no tenderness.  Musculoskeletal: He exhibits no edema or tenderness.  Tenderness to palpation over the left anterior chest towards the anterior axillary line in the area approximately the fourth of some of the ribs.  No overlying skin changes.  Breath sounds symmetric.  No crepitus.  Neurological: He is alert.  Skin: Skin is warm and dry.  Psychiatric: He has a normal mood and affect. His behavior is normal. Thought content normal.  Nursing note and vitals reviewed.    ED Treatments / Results  Labs (all labs ordered are listed, but only abnormal results are  displayed) Labs Reviewed - No data to display  EKG None  Radiology Dg Ribs Unilateral W/chest Left  Result Date: 07/16/2018 CLINICAL DATA:  Left anterior chest pain secondary to a fall 3 days ago. EXAM: LEFT RIBS AND CHEST - 3+ VIEW COMPARISON:  Chest x-ray dated 03/17/2014 FINDINGS: There are subtle fractures of the anterolateral aspects of the left fifth, sixth, and seventh ribs. Heart size and pulmonary vascularity are normal. Slight scarring at the left lung base laterally. Slight thickening of the minor fissure on the right, chronic. No pneumothorax or pleural effusion. IMPRESSION: Nondisplaced fractures of the anterolateral aspects of the left fifth, sixth and seventh ribs. Electronically Signed   By: Lorriane Shire M.D.   On: 07/16/2018 10:20    Procedures Procedures (including critical care time)  Definitve Fracture Care. Closed treatment of uncomplicated rib fractures: 3 (three):  Definitive fracture care was provided for a single closed rib fracture. Treatment including pain medication, discussion of possible complications including pneumonia, discussion of usage of incentive spirometry, and referral to primary care physician (or resource list provided) for further followup.   Medications Ordered in ED Medications  oxyCODONE-acetaminophen (PERCOCET/ROXICET) 5-325 MG per tablet 1 tablet (1 tablet Oral Given 07/16/18 1017)     Initial Impression / Assessment and Plan / ED Course  I have reviewed the triage vital signs and the nursing notes.  Pertinent labs & imaging results that were available during my care of the patient were reviewed by me and considered in my medical decision making (see chart for details).     62 year old male with left-sided chest pain after mechanical fall.  Imaging significant for 3 nondisplaced rib fractures.  No pneumothorax.  Continued NSAIDs.  Percocet for breakthrough pain.  Incentive spirometry.  Return precautions discussed.  Final Clinical  Impressions(s) / ED Diagnoses   Final diagnoses:  Closed fracture of multiple ribs of left side, initial encounter    ED Discharge Orders    None       Virgel Manifold, MD 07/16/18 1554

## 2018-07-16 NOTE — ED Triage Notes (Signed)
Patient c/o left rib cage pain following a fall from a slick spot in his yard x 3 days.

## 2019-12-13 ENCOUNTER — Ambulatory Visit: Payer: Self-pay | Attending: Internal Medicine

## 2019-12-13 ENCOUNTER — Other Ambulatory Visit: Payer: Self-pay

## 2019-12-13 DIAGNOSIS — Z23 Encounter for immunization: Secondary | ICD-10-CM

## 2019-12-13 NOTE — Progress Notes (Signed)
   Covid-19 Vaccination Clinic  Name:  [name]    MRN: [id number] DOB: 1956-01-04  12/13/2019  [name] was observed post Covid-19 immunization for 15 minutes without incident. He was provided with Vaccine Information Sheet and instruction to access the V-Safe system.   [name] was instructed to call 911 with any severe reactions post vaccine: Marland Kitchen Difficulty breathing  . Swelling of face and throat  . A fast heartbeat  . A bad rash all over body  . Dizziness and weakness   Immunizations Administered    Name Date Dose VIS Date Route   Pfizer COVID-19 Vaccine 12/13/2019  1:02 PM 0.3 mL 09/06/2019 Intramuscular   Manufacturer: [place]   Lot: [id number]   [place]: [id number]

## 2020-01-07 ENCOUNTER — Ambulatory Visit: Payer: Self-pay | Attending: Internal Medicine

## 2020-01-07 DIAGNOSIS — Z23 Encounter for immunization: Secondary | ICD-10-CM

## 2020-01-07 NOTE — Progress Notes (Signed)
   Covid-19 Vaccination Clinic  Name:  [name]    MRN: [id number] DOB: [birth year]  01/07/2020  [name] was observed post Covid-19 immunization for 15 minutes without incident. He was provided with Vaccine Information Sheet and instruction to access the V-Safe system.   [name] was instructed to call 911 with any severe reactions post vaccine: Marland Kitchen Difficulty breathing  . Swelling of face and throat  . A fast heartbeat  . A bad rash all over body  . Dizziness and weakness   Immunizations Administered    Name Date Dose VIS Date Route   Pfizer COVID-19 Vaccine 01/07/2020  1:21 PM 0.3 mL 09/06/2019 Intramuscular   Manufacturer: [place]   Lot: [id number]   [place]: [id number]

## 2021-01-26 ENCOUNTER — Other Ambulatory Visit: Payer: Self-pay

## 2021-01-26 ENCOUNTER — Emergency Department (HOSPITAL_COMMUNITY)
Admission: EM | Admit: 2021-01-26 | Discharge: 2021-01-26 | Disposition: A | Discharge status: Home / Self Care | Payer: 59 | Attending: Emergency Medicine | Admitting: Emergency Medicine

## 2021-01-26 ENCOUNTER — Encounter (HOSPITAL_COMMUNITY): Payer: Self-pay

## 2021-01-26 DIAGNOSIS — E119 Type 2 diabetes mellitus without complications: Secondary | ICD-10-CM | POA: Diagnosis not present

## 2021-01-26 DIAGNOSIS — M542 Cervicalgia: Secondary | ICD-10-CM | POA: Diagnosis present

## 2021-01-26 DIAGNOSIS — Z79899 Other long term (current) drug therapy: Secondary | ICD-10-CM | POA: Insufficient documentation

## 2021-01-26 DIAGNOSIS — Z7984 Long term (current) use of oral hypoglycemic drugs: Secondary | ICD-10-CM | POA: Insufficient documentation

## 2021-01-26 DIAGNOSIS — M436 Torticollis: Secondary | ICD-10-CM | POA: Diagnosis not present

## 2021-01-26 DIAGNOSIS — I10 Essential (primary) hypertension: Secondary | ICD-10-CM | POA: Diagnosis not present

## 2021-01-26 DIAGNOSIS — Z96651 Presence of right artificial knee joint: Secondary | ICD-10-CM | POA: Diagnosis not present

## 2021-01-26 DIAGNOSIS — Z794 Long term (current) use of insulin: Secondary | ICD-10-CM | POA: Insufficient documentation

## 2021-01-26 MED ORDER — OXYCODONE-ACETAMINOPHEN 5-325 MG PO TABS: 1.0000 | ORAL_TABLET | Freq: Four times a day (QID) | ORAL | 0 refills | Status: DC | PRN

## 2021-01-26 MED ORDER — KETOROLAC TROMETHAMINE 60 MG/2ML IM SOLN
60.0000 mg | Freq: Once | INTRAMUSCULAR | Status: AC
  Administered 2021-01-26: 60 mg via INTRAMUSCULAR
  Filled 2021-01-26: qty 2

## 2021-01-26 MED ORDER — OXYCODONE-ACETAMINOPHEN 5-325 MG PO TABS
1.0000 | ORAL_TABLET | Freq: Once | ORAL | Status: AC
  Administered 2021-01-26: 1 via ORAL
  Filled 2021-01-26: qty 1

## 2021-01-26 MED ORDER — CYCLOBENZAPRINE HCL 10 MG PO TABS
10.0000 mg | ORAL_TABLET | Freq: Once | ORAL | Status: AC
  Administered 2021-01-26: 10 mg via ORAL
  Filled 2021-01-26: qty 1

## 2021-01-26 MED ORDER — DICLOFENAC SODIUM 1 % EX GEL: 2.0000 g | Freq: Four times a day (QID) | CUTANEOUS | 0 refills | Status: DC

## 2021-01-26 MED ORDER — DICLOFENAC SODIUM 1 % EX GEL
2.0000 g | Freq: Once | CUTANEOUS | Status: AC
  Administered 2021-01-26: 2 g via TOPICAL
  Filled 2021-01-26: qty 100

## 2021-01-26 NOTE — ED Provider Notes (Signed)
[facility] DEPT Provider Note   CSN: [id number] Arrival date & time: 01/26/21  0126     History Chief Complaint  Patient presents with  . Neck Pain    [name] is a 65 y.o. male.  The history is provided by the patient.  Neck Pain  [name] is a 65 y.o. male who presents to the Emergency Department complaining of neck pain. Yesterday morning he woke up with pain to the left side of his neck. He took a leave and Flexeril at home with no significant improvement in his symptoms. Today pain worsened and he presents for evaluation. He denies any chest pain, difficulty breathing, numbness, weakness, fevers. Pain is located at the base of the neck radiates up the left side and across to his shoulder. It sometimes radiates to the right side. Pain is worse when he turns his head to the left.    Past Medical History:  Diagnosis Date  . Arthritis   . Diabetes mellitus    fasting 120s  . Hypertension   . Kidney stone   . Kidney stones   . Peripheral neuropathy    left fifth finger, left toe  . Pneumonia    hx of    Patient Active Problem List   Diagnosis Date Noted  . Arthritis of knee 09/02/2014  . 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 Nephrolithiasis 02/01/2014  . Pneumonia 08/27/2011  . Diabetes mellitus 08/27/2011    Past Surgical History:  Procedure Laterality Date  . APPENDECTOMY    . CYSTOSCOPY W/ URETERAL STENT PLACEMENT Bilateral 03/17/2014   Procedure: CYSTOSCOPY WITH RIGHT STENT PULL  ;  Surgeon: Alexis Frock, MD;  Location: [facility];  Service: Urology;  Laterality: Bilateral;  . CYSTOSCOPY WITH RETROGRADE PYELOGRAM, URETEROSCOPY AND STENT PLACEMENT Bilateral 02/01/2014   Procedure: CYSTOSCOPY WITH BILATERAL RETROGRADE CYSTOSCOPY, BILATERAL RETROGRADE, PYELOGRAM, RIGHT URETEROSCOPY, STONE BASKETRY,  AND BILATERAL STENT PLACEMENT;  Surgeon: Alexis Frock, MD;  Location: [facility];  Service: Urology;  Laterality: Bilateral;  .  HOLMIUM LASER APPLICATION Left 03/19/7627   Procedure: HOLMIUM LASER APPLICATION;  Surgeon: Alexis Frock, MD;  Location: [facility];  Service: Urology;  Laterality: Left;  . LITHOTRIPSY    . NEPHROLITHOTOMY Left 03/17/2014   Procedure: NEPHROLITHOTOMY PERCUTANEOUS with access;  Surgeon: Alexis Frock, MD;  Location: [facility];  Service: Urology;  Laterality: Left;  . NEPHROLITHOTOMY Left 03/19/2014   Procedure: 2ND STAGE NEPHROLITHOTOMY PERCUTANEOUS left ureteroscopy with lasr stone treatment and stent placement, with access;  Surgeon: Alexis Frock, MD;  Location: [facility];  Service: Urology;  Laterality: Left;  . REPLACEMENT TOTAL KNEE Right   . TOTAL KNEE ARTHROPLASTY Right 09/02/2014   Procedure: RIGHT TOTAL KNEE ARTHROPLASTY;  Surgeon: Meredith Pel, MD;  Location: [facility];  Service: Orthopedics;  Laterality: Right;  . uretral stent         History reviewed. No pertinent family history.  Social History   Tobacco Use  . Smoking status: Never Smoker  . Smokeless tobacco: Never Used  Vaping Use  . Vaping Use: Never used  Substance Use Topics  . Alcohol use: Yes    Alcohol/week: 2.0 standard drinks    Types: 2 Cans of beer per week    Comment: occasional/ 1-2 beers week  . Drug use: No    Home Medications Prior to Admission medications   Medication Sig Start Date End Date Taking? Authorizing Provider  diclofenac Sodium (VOLTAREN) 1 % GEL Apply 2 g topically 4 (  four) times daily. 01/26/21  Yes Quintella Reichert, MD  oxyCODONE-acetaminophen (PERCOCET/ROXICET) 5-325 MG tablet Take 1 tablet by mouth every 6 (six) hours as needed for severe pain. 01/26/21  Yes Quintella Reichert, MD  amLODipine (NORVASC) 2.5 MG tablet Take 2.5 mg by mouth every morning.     [provider]  Ascorbic Acid (VITAMIN C) 1000 MG tablet Take 1,000 mg by mouth daily.    [provider]  atorvastatin (LIPITOR) 10 MG tablet Take 10 mg by mouth every evening.    [provider]  cholecalciferol  (VITAMIN D) 1000 UNITS tablet Take 1,000 Units by mouth daily.    [provider]  Cyanocobalamin (VITAMIN B-12) 2500 MCG SUBL Place 1 tablet under the tongue daily.    [provider]  cyclobenzaprine (FLEXERIL) 10 MG tablet TAKE 1 TABLET BY MOUTH TWICE DAILY 07/10/17   Meredith Pel, MD  diazepam (VALIUM) 5 MG tablet Take 1 tablet (5 mg total) by mouth 2 (two) times daily. 05/05/15   Delsa Grana, PA-C  gabapentin (NEURONTIN) 100 MG capsule Take 300 mg by mouth 3 (three) times daily.     [provider]  Ginkgo Biloba 120 MG CAPS Take 2 capsules by mouth daily.    [provider]  ibuprofen (ADVIL,MOTRIN) 800 MG tablet Take 1 tablet (800 mg total) by mouth 3 (three) times daily. 05/05/15   Delsa Grana, PA-C  insulin glargine (LANTUS) 100 UNIT/ML injection Inject 30 Units into the skin at bedtime.     [provider]  lisinopril (PRINIVIL,ZESTRIL) 40 MG tablet Take 40 mg by mouth every morning.     [provider]  meloxicam (MOBIC) 15 MG tablet TAKE 1 TABLET BY MOUTH ONCE DAILY 07/10/17   Meredith Pel, MD  metFORMIN (GLUCOPHAGE) 500 MG tablet Take 1,000 mg by mouth 2 (two) times daily with a meal.     [provider]  methocarbamol (ROBAXIN) 500 MG tablet Take 1 tablet (500 mg total) by mouth 2 (two) times daily. 05/05/15   Delsa Grana, PA-C  Multiple Vitamins-Minerals (MULTIVITAMINS THER. W/MINERALS) TABS Take 1 tablet by mouth every morning.     [provider]  Omega-3 Fatty Acids (FISH OIL) 1200 MG CAPS Take 1 capsule by mouth daily.    [provider]  oxybutynin (DITROPAN) 5 MG tablet Take 1 tablet (5 mg total) by mouth every 8 (eight) hours as needed for bladder spasms. / stent discomfort Patient not taking: Reported on 05/05/2015 02/01/14   Alexis Frock, MD  pioglitazone (ACTOS) 15 MG tablet Take 15 mg by mouth daily.    [provider]  tamsulosin (FLOMAX) 0.4 MG CAPS capsule Take 1 capsule  (0.4 mg total) by mouth 2 (two) times daily. Patient not taking: Reported on 05/05/2015 03/05/15   Noemi Chapel, MD    Allergies    Patient has no known allergies.  Review of Systems   Review of Systems  Musculoskeletal: Positive for neck pain.  All other systems reviewed and are negative.   Physical Exam Updated Vital Signs BP (!) 154/80   Pulse 80   Temp 98.8 F (37.1 C) (Oral)   Resp 18   Ht 5\' 10"  (1.778 m)   Wt 86.6 kg   SpO2 93%   BMI 27.41 kg/m   Physical Exam Vitals and nursing note reviewed.  Constitutional:      Appearance: He is well-developed.  HENT:     Head: Normocephalic and atraumatic.  Neck:  Comments: Position of comfort is with neck rotated slightly laterally to the right. There is pain with lateral rotation to the left Cardiovascular:     Rate and Rhythm: Normal rate and regular rhythm.     Heart sounds: No murmur heard.   Pulmonary:     Effort: Pulmonary effort is normal. No respiratory distress.     Breath sounds: Normal breath sounds.  Abdominal:     Palpations: Abdomen is soft.     Tenderness: There is no abdominal tenderness. There is no guarding or rebound.  Musculoskeletal:     Comments: 2+ radial pulses bilaterally. There is tenderness to palpation throughout the trapezius muscle on the left. There is no discrete bony tenderness  Skin:    General: Skin is warm and dry.  Neurological:     Mental Status: He is alert and oriented to person, place, and time.     Comments: Five out of five strength in all four extremities with sensation to light touch intact in all four extremities  Psychiatric:        Behavior: Behavior normal.     ED Results / Procedures / Treatments   Labs (all labs ordered are listed, but only abnormal results are displayed) Labs Reviewed - No data to display  EKG None  Radiology No results found.  Procedures Procedures   Medications Ordered in ED Medications  diclofenac Sodium (VOLTAREN) 1 % topical  gel 2 g (has no administration in time range)  ketorolac (TORADOL) injection 60 mg (60 mg Intramuscular Given 01/26/21 0436)  cyclobenzaprine (FLEXERIL) tablet 10 mg (10 mg Oral Given 01/26/21 0437)  oxyCODONE-acetaminophen (PERCOCET/ROXICET) 5-325 MG per tablet 1 tablet (1 tablet Oral Given 01/26/21 0437)    ED Course  I have reviewed the triage vital signs and the nursing notes.  Pertinent labs & imaging results that were available during my care of the patient were reviewed by me and considered in my medical decision making (see chart for details).    MDM Rules/Calculators/A&P                         Patient here for evaluation of left neck pain that started yesterday. He has focal tenderness to palpation over the left trapezius muscle. He is neurovascularly intact on evaluation. Presentation is not consistent with dissection, PE, ACS. Discussed with patient home care for torticollis. Discussed  return precautions.  Final Clinical Impression(s) / ED Diagnoses Final diagnoses:  Torticollis    Rx / DC Orders ED Discharge Orders         Ordered    oxyCODONE-acetaminophen (PERCOCET/ROXICET) 5-325 MG tablet  Every 6 hours PRN        01/26/21 0438    diclofenac Sodium (VOLTAREN) 1 % GEL  4 times daily        01/26/21 0438           Quintella Reichert, MD 01/26/21 [phone]

## 2021-01-26 NOTE — ED Triage Notes (Signed)
Pt c/o L sided neck pain radiating to R started after waking up yesterday. Denies injury. Tried aleve and a baclofen PTA without relief

## 2021-02-11 ENCOUNTER — Other Ambulatory Visit: Payer: Self-pay

## 2021-02-11 ENCOUNTER — Encounter (HOSPITAL_COMMUNITY): Payer: Self-pay | Admitting: Urology

## 2021-02-11 ENCOUNTER — Other Ambulatory Visit: Payer: Self-pay | Admitting: Urology

## 2021-02-11 NOTE — Progress Notes (Signed)
COVID Vaccine Completed: Yes Date COVID Vaccine completed: x2 Has received booster: yes COVID vaccine manufacturer: Pfizer     Date of COVID positive in last 90 days: No  PCP - Greig Right, MD Cardiologist -  N/A  Chest x-ray - greater than 1 year EKG - greater than 1 year Stress Test -  N/A ECHO -  N/A Cardiac Cath -  N/A Pacemaker/ICD device last checked: N/A  Sleep Study -  N/A CPAP -  N/A  Fasting Blood Sugar - 110-130's Checks Blood Sugar __1___ times a day  Blood Thinner Instructions:  N/A Aspirin Instructions:  N/A Last Dose:  N/A  Activity level:  Able to exercise without symptoms     Anesthesia review: Diabetes, Hypertension  Patient denies shortness of breath, fever, cough and chest pain at PAT appointment   Patient verbalized understanding of instructions that were given to them at the PAT appointment. Patient was also instructed that they will need to review over the PAT instructions again at home before surgery.

## 2021-02-12 ENCOUNTER — Ambulatory Visit (HOSPITAL_COMMUNITY): Payer: 59 | Admitting: Physician Assistant

## 2021-02-12 ENCOUNTER — Ambulatory Visit (HOSPITAL_COMMUNITY)
Admission: RE | Admit: 2021-02-12 | Discharge: 2021-02-12 | Disposition: A | Discharge status: Home / Self Care | Payer: 59 | Attending: Urology | Admitting: Urology

## 2021-02-12 ENCOUNTER — Ambulatory Visit (HOSPITAL_COMMUNITY): Payer: 59

## 2021-02-12 ENCOUNTER — Encounter (HOSPITAL_COMMUNITY): Payer: Self-pay | Admitting: Urology

## 2021-02-12 ENCOUNTER — Encounter (HOSPITAL_COMMUNITY)
Admission: RE | Disposition: A | Discharge status: Home / Self Care | Payer: Self-pay | Source: Home / Self Care | Attending: Urology

## 2021-02-12 DIAGNOSIS — Z87442 Personal history of urinary calculi: Secondary | ICD-10-CM | POA: Insufficient documentation

## 2021-02-12 DIAGNOSIS — Z7984 Long term (current) use of oral hypoglycemic drugs: Secondary | ICD-10-CM | POA: Insufficient documentation

## 2021-02-12 DIAGNOSIS — N132 Hydronephrosis with renal and ureteral calculous obstruction: Secondary | ICD-10-CM | POA: Insufficient documentation

## 2021-02-12 DIAGNOSIS — Z79899 Other long term (current) drug therapy: Secondary | ICD-10-CM | POA: Diagnosis not present

## 2021-02-12 DIAGNOSIS — N200 Calculus of kidney: Secondary | ICD-10-CM | POA: Diagnosis present

## 2021-02-12 DIAGNOSIS — N131 Hydronephrosis with ureteral stricture, not elsewhere classified: Secondary | ICD-10-CM | POA: Diagnosis not present

## 2021-02-12 DIAGNOSIS — Z96659 Presence of unspecified artificial knee joint: Secondary | ICD-10-CM | POA: Diagnosis not present

## 2021-02-12 HISTORY — DX: Personal history of urinary calculi: Z87.442

## 2021-02-12 HISTORY — PX: CYSTOSCOPY WITH URETEROSCOPY AND STENT PLACEMENT: SHX6377

## 2021-02-12 LAB — HEMOGLOBIN A1C
Hgb A1c MFr Bld: 9.1 % — ABNORMAL HIGH (ref 4.8–5.6)
Mean Plasma Glucose: 214.47 mg/dL

## 2021-02-12 LAB — BASIC METABOLIC PANEL
Anion gap: 11 (ref 5–15)
BUN: 15 mg/dL (ref 8–23)
CO2: 27 mmol/L (ref 22–32)
Calcium: 9.5 mg/dL (ref 8.9–10.3)
Chloride: 100 mmol/L (ref 98–111)
Creatinine, Ser: 0.73 mg/dL (ref 0.61–1.24)
GFR, Estimated: >60 mL/min (ref >60)
Glucose, Bld: 172 mg/dL — ABNORMAL HIGH (ref 70–99)
Potassium: 3.6 mmol/L (ref 3.5–5.1)
Sodium: 138 mmol/L (ref 135–145)

## 2021-02-12 LAB — CBC WITH DIFFERENTIAL/PLATELET
Abs Immature Granulocytes: 0.09 10*3/uL — ABNORMAL HIGH (ref 0.00–0.07)
Basophils Absolute: 0.1 10*3/uL (ref 0.0–0.1)
Basophils Relative: 1 %
Eosinophils Absolute: 0.2 10*3/uL (ref 0.0–0.5)
Eosinophils Relative: 2 %
HCT: 44.7 % (ref 39.0–52.0)
Hemoglobin: 14.9 g/dL (ref 13.0–17.0)
Immature Granulocytes: 1 %
Lymphocytes Relative: 33 %
Lymphs Abs: 3.3 10*3/uL (ref 0.7–4.0)
MCH: 31.6 pg (ref 26.0–34.0)
MCHC: 33.3 g/dL (ref 30.0–36.0)
MCV: 94.7 fL (ref 80.0–100.0)
Monocytes Absolute: 0.8 10*3/uL (ref 0.1–1.0)
Monocytes Relative: 8 %
Neutro Abs: 5.8 10*3/uL (ref 1.7–7.7)
Neutrophils Relative %: 55 %
Platelets: 358 10*3/uL (ref 150–400)
RBC: 4.72 MIL/uL (ref 4.22–5.81)
RDW: 12.7 % (ref 11.5–15.5)
WBC: 10.3 10*3/uL (ref 4.0–10.5)
nRBC: 0 % (ref 0.0–0.2)

## 2021-02-12 LAB — GLUCOSE, CAPILLARY
Glucose-Capillary: 172 mg/dL — ABNORMAL HIGH (ref 70–99)
Glucose-Capillary: 166 mg/dL — ABNORMAL HIGH (ref 70–99)

## 2021-02-12 SURGERY — CYSTOURETEROSCOPY, WITH STENT INSERTION
Anesthesia: General | Laterality: Left

## 2021-02-12 MED ORDER — LIDOCAINE 2% (20 MG/ML) 5 ML SYRINGE
INTRAMUSCULAR | Status: DC | PRN
  Administered 2021-02-12: 60 mg via INTRAVENOUS

## 2021-02-12 MED ORDER — KETOROLAC TROMETHAMINE 30 MG/ML IJ SOLN
INTRAMUSCULAR | Status: AC
  Filled 2021-02-12: qty 1

## 2021-02-12 MED ORDER — MIDAZOLAM HCL 2 MG/2ML IJ SOLN
INTRAMUSCULAR | Status: AC
  Filled 2021-02-12: qty 2

## 2021-02-12 MED ORDER — ONDANSETRON HCL 4 MG/2ML IJ SOLN
INTRAMUSCULAR | Status: AC
  Filled 2021-02-12: qty 2

## 2021-02-12 MED ORDER — DEXAMETHASONE SODIUM PHOSPHATE 10 MG/ML IJ SOLN
INTRAMUSCULAR | Status: AC
  Filled 2021-02-12: qty 1

## 2021-02-12 MED ORDER — OXYCODONE HCL 5 MG PO TABS
5.0000 mg | ORAL_TABLET | Freq: Once | ORAL | Status: AC | PRN
Start: 2021-02-12 — End: 2021-02-12
  Administered 2021-02-12: 5 mg via ORAL

## 2021-02-12 MED ORDER — ORAL CARE MOUTH RINSE: 15.0000 mL | Freq: Once | OROMUCOSAL | Status: AC

## 2021-02-12 MED ORDER — PHENYLEPHRINE 40 MCG/ML (10ML) SYRINGE FOR IV PUSH (FOR BLOOD PRESSURE SUPPORT)
PREFILLED_SYRINGE | INTRAVENOUS | Status: DC | PRN
  Administered 2021-02-12: 200 ug via INTRAVENOUS
  Administered 2021-02-12: 80 ug via INTRAVENOUS

## 2021-02-12 MED ORDER — LACTATED RINGERS IV SOLN: INTRAVENOUS | Status: DC

## 2021-02-12 MED ORDER — 0.9 % SODIUM CHLORIDE (POUR BTL) OPTIME
TOPICAL | Status: DC | PRN
  Administered 2021-02-12: 1000 mL

## 2021-02-12 MED ORDER — DEXAMETHASONE SODIUM PHOSPHATE 10 MG/ML IJ SOLN
INTRAMUSCULAR | Status: DC | PRN
  Administered 2021-02-12: 10 mg via INTRAVENOUS

## 2021-02-12 MED ORDER — HYDROMORPHONE HCL 1 MG/ML IJ SOLN
INTRAMUSCULAR | Status: DC | PRN
  Administered 2021-02-12: 1 mg via INTRAVENOUS

## 2021-02-12 MED ORDER — ACETAMINOPHEN 500 MG PO TABS
1000.0000 mg | ORAL_TABLET | Freq: Once | ORAL | Status: AC
  Administered 2021-02-12: 1000 mg via ORAL

## 2021-02-12 MED ORDER — IOHEXOL 300 MG/ML  SOLN
INTRAMUSCULAR | Status: DC | PRN
  Administered 2021-02-12: 20 mL

## 2021-02-12 MED ORDER — FENTANYL CITRATE (PF) 100 MCG/2ML IJ SOLN
25.0000 ug | INTRAMUSCULAR | Status: DC | PRN
  Administered 2021-02-12: 50 ug via INTRAVENOUS
  Administered 2021-02-12 (×2): 25 ug via INTRAVENOUS

## 2021-02-12 MED ORDER — HYDROCODONE-ACETAMINOPHEN 5-325 MG PO TABS: 1.0000 | ORAL_TABLET | ORAL | 0 refills | Status: DC | PRN

## 2021-02-12 MED ORDER — PROPOFOL 10 MG/ML IV BOLUS
INTRAVENOUS | Status: DC | PRN
  Administered 2021-02-12: 120 mg via INTRAVENOUS

## 2021-02-12 MED ORDER — SODIUM CHLORIDE 0.9 % IR SOLN
Status: DC | PRN
  Administered 2021-02-12: 5000 mL

## 2021-02-12 MED ORDER — LIDOCAINE 2% (20 MG/ML) 5 ML SYRINGE
INTRAMUSCULAR | Status: AC
  Filled 2021-02-12: qty 5

## 2021-02-12 MED ORDER — KETOROLAC TROMETHAMINE 30 MG/ML IJ SOLN
30.0000 mg | Freq: Once | INTRAMUSCULAR | Status: AC
  Administered 2021-02-12: 30 mg via INTRAVENOUS

## 2021-02-12 MED ORDER — HYDROMORPHONE HCL 2 MG/ML IJ SOLN
INTRAMUSCULAR | Status: AC
  Filled 2021-02-12: qty 1

## 2021-02-12 MED ORDER — OXYCODONE HCL 5 MG PO TABS
ORAL_TABLET | ORAL | Status: AC
  Filled 2021-02-12: qty 1

## 2021-02-12 MED ORDER — FENTANYL CITRATE (PF) 100 MCG/2ML IJ SOLN
INTRAMUSCULAR | Status: AC
  Filled 2021-02-12: qty 2

## 2021-02-12 MED ORDER — CHLORHEXIDINE GLUCONATE 0.12 % MT SOLN
15.0000 mL | Freq: Once | OROMUCOSAL | Status: AC
  Administered 2021-02-12: 15 mL via OROMUCOSAL

## 2021-02-12 MED ORDER — CEFAZOLIN SODIUM-DEXTROSE 2-4 GM/100ML-% IV SOLN
2.0000 g | INTRAVENOUS | Status: AC
  Administered 2021-02-12: 2 g via INTRAVENOUS
  Filled 2021-02-12: qty 100

## 2021-02-12 MED ORDER — MIDAZOLAM HCL 5 MG/5ML IJ SOLN
INTRAMUSCULAR | Status: DC | PRN
  Administered 2021-02-12: 2 mg via INTRAVENOUS

## 2021-02-12 MED ORDER — PHENYLEPHRINE 40 MCG/ML (10ML) SYRINGE FOR IV PUSH (FOR BLOOD PRESSURE SUPPORT)
PREFILLED_SYRINGE | INTRAVENOUS | Status: AC
  Filled 2021-02-12: qty 10

## 2021-02-12 MED ORDER — ACETAMINOPHEN 500 MG PO TABS
ORAL_TABLET | ORAL | Status: AC
  Filled 2021-02-12: qty 2

## 2021-02-12 MED ORDER — ONDANSETRON HCL 4 MG/2ML IJ SOLN
INTRAMUSCULAR | Status: DC | PRN
  Administered 2021-02-12: 4 mg via INTRAVENOUS

## 2021-02-12 MED ORDER — FENTANYL CITRATE (PF) 100 MCG/2ML IJ SOLN
INTRAMUSCULAR | Status: DC | PRN
  Administered 2021-02-12: 50 ug via INTRAVENOUS
  Administered 2021-02-12 (×2): 25 ug via INTRAVENOUS

## 2021-02-12 SURGICAL SUPPLY — 25 items
BAG URO CATCHER STRL LF (MISCELLANEOUS) ×2 IMPLANT
BASKET LASER NITINOL 1.9FR (BASKET) IMPLANT
BASKET ZERO TIP NITINOL 2.4FR (BASKET) ×1 IMPLANT
BSKT STON RTRVL 120 1.9FR (BASKET)
BSKT STON RTRVL ZERO TP 2.4FR (BASKET) ×1
CATH INTERMIT  6FR 70CM (CATHETERS) ×2 IMPLANT
CLOTH BEACON ORANGE TIMEOUT ST (SAFETY) ×2 IMPLANT
EXTRACTOR STONE 1.7FRX115CM (UROLOGICAL SUPPLIES) IMPLANT
GLOVE SURG ENC MOIS LTX SZ7.5 (GLOVE) ×2 IMPLANT
GOWN STRL REUS W/TWL XL LVL3 (GOWN DISPOSABLE) ×2 IMPLANT
GUIDEWIRE ANG ZIPWIRE 038X150 (WIRE) IMPLANT
GUIDEWIRE STR DUAL SENSOR (WIRE) ×3 IMPLANT
KIT BALLN UROMAX 15FX4 (MISCELLANEOUS) IMPLANT
KIT BALLN UROMAX 26 75X4 (MISCELLANEOUS) ×2
KIT TURNOVER KIT A (KITS) ×2 IMPLANT
LASER FIB FLEXIVA PULSE ID 365 (Laser) IMPLANT
MANIFOLD NEPTUNE II (INSTRUMENTS) ×2 IMPLANT
PACK CYSTO (CUSTOM PROCEDURE TRAY) ×2 IMPLANT
SHEATH URETERAL 12FRX28CM (UROLOGICAL SUPPLIES) IMPLANT
SHEATH URETERAL 12FRX35CM (MISCELLANEOUS) ×1 IMPLANT
STENT CONTOUR NO GW 8FR 26CM (STENTS) ×1 IMPLANT
TRACTIP FLEXIVA PULS ID 200XHI (Laser) IMPLANT
TRACTIP FLEXIVA PULSE ID 200 (Laser) ×4
TUBING CONNECTING 10 (TUBING) ×2 IMPLANT
TUBING UROLOGY SET (TUBING) ×1 IMPLANT

## 2021-02-12 NOTE — Interval H&P Note (Signed)
History and Physical Interval Note:  02/12/2021 2:27 PM  [name]  has presented today for surgery, with the diagnosis of LEFT URETERAL STONE.  The various methods of treatment have been discussed with the patient and family. After consideration of risks, benefits and other options for treatment, the patient has consented to  Procedure(s): CYSTOSCOPY WITH LEFT URETEROSCOPY HOLMIUM LASER LITHOTRIPSY AND STENT PLACEMENT (Left) as a surgical intervention.  The patient's history has been reviewed, patient examined, no change in status, stable for surgery.  I have reviewed the patient's chart and labs.  Questions were answered to the patient's satisfaction.     Marton Redwood, III

## 2021-02-12 NOTE — H&P (Signed)
CC/HPI: CC: Left flank pain, history nephrolithiasis, erectile dysfunction, prostate check  HPI:  02/11/2021  Patient has history nephrolithiasis. He has required procedures in the past including PCNL. Over the past week he passed a stone. He continues to have left-sided lower flank pain. He had hematuria with the stone passage but no further hematuria. Urinalysis negative today. He continues to have this left-sided flank pain. He has mild voiding complaints including difficulty initiating stream. He is fine once he gets it started. No interest in treatment at this time. He has not been having his prostate checked. No recent PSA. He also has erectile dysfunction. He has difficult time obtaining and maintaining an erection. He denies nitrates for chest pain. He had good success with sildenafil in the past.     ALLERGIES: No Allergies    MEDICATIONS: Hydrochlorothiazide 12.5 mg tablet  Amlodipine Besylate 10 mg tablet  Atorvastatin Calcium 20 mg tablet  Citalopram Hbr 20 mg tablet  Gabapentin 300 mg capsule  Lantus 100 unit/ml vial Subcutaneous  Lisinopril 40 mg tablet Oral  Metformin Hcl 500 mg tablet Oral  Pioglitazone Hcl 30 mg tablet     GU PSH: Cystoscopy Insert Stent - 2015, 2015 Percut Stone Removal >2cm - 2015, 2015 Ureteroscopic stone removal - 2015       PSH Notes: Knee Replacement, Percutaneous Lithotomy For Stone Over 2cm., Cystoscopy With Insertion Of Ureteral Stent Left, Percutaneous Lithotomy For Stone Over 2cm., Cystoscopy With Ureteroscopy With Removal Of Calculus, Cystoscopy With Insertion Of Ureteral Stent Bilateral, Appendectomy   NON-GU PSH: Appendectomy - 2008 Revise Knee Joint - 2016     GU PMH: Renal calculus, Staghorn calculus - 2016 Ureteral calculus, Right ureteral stone - 2016 Hypocitraturia, Hyperuricosuria - 2015 Encounter for Prostate Cancer screening, Prostate cancer screening - 2015    NON-GU PMH: Encounter for general adult medical examination  without abnormal findings, Encounter for preventive health examination - 2016 Diabetes Type 2 Hypercholesterolemia Hypertension    FAMILY HISTORY: Alzheimer's Disease - Mother Death of family member - Mother leukemia - Father multiple sclerosis - Mother    Notes: 1 son; 1 daughter    SOCIAL HISTORY: Marital Status: Widowed Preferred Language: English; Ethnicity: Not Hispanic Or Latino; Race: White Current Smoking Status: Patient has never smoked.   Tobacco Use Assessment Completed: Used Tobacco in last 30 days? Does not use smokeless tobacco. Does drink.  Drinks 1 caffeinated drink per day. Patient's occupation Building services engineer.     Notes: Never a smoker, Caffeine Use, Alcohol Use, Tobacco Use, Occupation:, Marital History - Currently Married   REVIEW OF SYSTEMS:    GU Review Male:   Patient reports frequent urination, get up at night to urinate, trouble starting your stream, have to strain to urinate , and erection problems. Patient denies hard to postpone urination, burning/ pain with urination, leakage of urine, stream starts and stops, and penile pain.  Gastrointestinal (Upper):   Patient denies nausea, vomiting, and indigestion/ heartburn.  Gastrointestinal (Lower):   Patient denies diarrhea and constipation.  Constitutional:   Patient denies fever, night sweats, weight loss, and fatigue.  Skin:   Patient denies skin rash/ lesion and itching.  Eyes:   Patient denies blurred vision and double vision.  Ears/ Nose/ Throat:   Patient denies sore throat and sinus problems.  Hematologic/Lymphatic:   Patient denies easy bruising and swollen glands.  Cardiovascular:   Patient denies leg swelling and chest pains.  Respiratory:   Patient denies cough and shortness of breath.  Endocrine:  Patient denies excessive thirst.  Musculoskeletal:   Patient denies back pain and joint pain.  Neurological:   Patient denies headaches and dizziness.  Psychologic:   Patient denies depression  and anxiety.   VITAL SIGNS:      02/11/2021 08:31 AM  Weight 188 lb / 85.28 kg  Height 70 in / 177.8 cm  BP 169/87 mmHg  Pulse 77 /min  Temperature 98.0 F / 36.6 C  BMI 27.0 kg/m   GU PHYSICAL EXAMINATION:    Testes: No tenderness, no swelling, no enlargement left testes. No tenderness, no swelling, no enlargement right testes. Normal location left testes. Normal location right testes. No mass, no cyst, no varicocele, no hydrocele left testes. No mass, no cyst, no varicocele, no hydrocele right testes.  Penis: Penis uncircumcised. No foreskin warts, no cracks. No dorsal peyronie's plaques, no left corporal peyronie's plaques, no right corporal peyronie's plaques, no scarring, no shaft warts. No balanitis, no meatal stenosis.   Prostate: 40 g. Discrete nodule just to the right of midline in the mid prostate about a BB-sized   MULTI-SYSTEM PHYSICAL EXAMINATION:    Constitutional: Well-nourished. No physical deformities. Normally developed. Good grooming.  Respiratory: No labored breathing, no use of accessory muscles.   Cardiovascular: Normal temperature, normal extremity pulses, no swelling, no varicosities.  Skin: No paleness, no jaundice, no cyanosis. No lesion, no ulcer, no rash.  Neurologic / Psychiatric: Oriented to time, oriented to place, oriented to person. No depression, no anxiety, no agitation.  Gastrointestinal: No mass, no tenderness, no rigidity, non obese abdomen. no CVA tenderness  Eyes: Normal conjunctivae. Normal eyelids.  Musculoskeletal: Normal gait and station of head and neck.     Complexity of Data:  Source Of History:  Patient  Records Review:   Previous Doctor Records, Previous Patient Records  Urine Test Review:   Urinalysis   04/04/14  PSA  Total PSA 0.52     PROCEDURES:         C.T. Urogram - [id number]  There is a large left renal pelvis stone. Multiple other renal calculi      Patient confirmed No Neulasta OnPro Device.         Urinalysis Dipstick  Dipstick Cont'd  Color: Yellow Bilirubin: Neg mg/dL  Appearance: Clear Ketones: Neg mg/dL  Specific Gravity: 1.025 Blood: Neg ery/uL  pH: <=5.0 Protein: Neg mg/dL  Glucose: 2+ mg/dL Urobilinogen: 0.2 mg/dL    Nitrites: Neg    Leukocyte Esterase: Neg leu/uL    ASSESSMENT:      ICD-10 Details  1 GU:   Encounter for Prostate Cancer screening - Z12.5 Undiagnosed New Problem  2   Prostate nodule w/ LUTS - N40.3 Undiagnosed New Problem  3   Urinary Hesitancy - R39.11 Undiagnosed New Problem  4   Low back pain - M54.5 Undiagnosed New Problem  5   ED due to arterial insufficiency - N52.01 Undiagnosed New Problem  6   Renal and ureteral calculus - N20.2    PLAN:            Medications New Meds: Sildenafil Citrate 100 mg tablet 1 tablet PO prn Take 1 tab po 1hr prior to sexual activities  #30  0 Refill(s)            Orders Labs PSA  X-Rays: C.T. Stone Protocol Without Contrast  X-Ray Notes: History:  Hematuria: Yes/No  Patient to see MD after exam: Yes/No  Previous exam: CT / IVP/ US/ KUB/ None  When:  Where:  Diabetic: Yes/ No  BUN/ Creatine:  Date of last BUN Creatinine:  Weight in pounds:  Allergy- Contrasts/ Shellfish: Yes/ No  Conflicting diabetic meds: Yes/ No  Oral contrast and instructions given to patient:   Prior Authorization #: [id number] valid 02/11/21 thru 03/12/21            Schedule Return Visit/Planned Activity: Next Available Appointment - [facility]          Document Letter(s):  Created for Patient: Clinical Summary         Notes:   patient would like to try sildenafil again. Side effect profile discussed. He denies nitrates. This was dispensed, 100 mg.   We discussed the general pros and cons of PSA screening, with the benefits being early cancer detection, hopefully allowing for treatment while curable, and with the cons being anxiety and need for further invasive testing (prostate biopsy) and treatment which may pose more substantial risks.  We discussed specific pros/cons in direct relationship to his overall health status and life expectancy.  I extensively discussed the option of prostate biopsy. I specifically discussed potential risks including but not limited to the possibility of blood per rectum, per urethra and in the ejaculate, urinary retention, the possibility for serious infection. Recommend proceeding given his prostate nodule. We will obtain a PSA today.   recommend ureteroscopy with laser lithotripsy and ureteral stent placement. Risks and benefits discussed.   CC: Dr. Burnett Sheng    Signed by Link Snuffer, III, M.D. on 02/11/21 at 10:25 AM (EDT

## 2021-02-12 NOTE — Anesthesia Procedure Notes (Signed)
Procedure Name: LMA Insertion Date/Time: 02/12/2021 2:23 PM Performed by: Kinley Dozier D, CRNA Pre-anesthesia Checklist: Patient identified, Emergency Drugs available, Suction available and Patient being monitored Patient Re-evaluated:Patient Re-evaluated prior to induction Oxygen Delivery Method: Circle system utilized Preoxygenation: Pre-oxygenation with 100% oxygen Induction Type: IV induction Ventilation: Mask ventilation without difficulty LMA: LMA inserted LMA Size: 4.0 Tube type: Oral Number of attempts: 1 Placement Confirmation: positive ETCO2 and breath sounds checked- equal and bilateral Tube secured with: Tape Dental Injury: Teeth and Oropharynx as per pre-operative assessment

## 2021-02-12 NOTE — Anesthesia Preprocedure Evaluation (Addendum)
Anesthesia Evaluation  Patient identified by MRN, date of birth, ID band Patient awake    Reviewed: Allergy & Precautions, NPO status , Patient's Chart, lab work & pertinent test results  Airway Mallampati: I  TM Distance: >3 FB Neck ROM: Full    Dental  (+) Chipped, Dental Advisory Given,    Pulmonary neg pulmonary ROS,    Pulmonary exam normal breath sounds clear to auscultation       Cardiovascular hypertension, Pt. on medications Normal cardiovascular exam Rhythm:Regular Rate:Normal     Neuro/Psych negative neurological ROS  negative psych ROS   GI/Hepatic negative GI ROS, Neg liver ROS,   Endo/Other  diabetes, Type 2, Oral Hypoglycemic Agents, Insulin Dependent  Renal/GU negative Renal ROS  negative genitourinary   Musculoskeletal  (+) Arthritis ,   Abdominal   Peds  Hematology negative hematology ROS (+)   Anesthesia Other Findings   Reproductive/Obstetrics                           Anesthesia Physical Anesthesia Plan  ASA: II  Anesthesia Plan: General   Post-op Pain Management:    Induction: Intravenous  PONV Risk Score and Plan: 2 and Ondansetron, Dexamethasone and Midazolam  Airway Management Planned: LMA  Additional Equipment:   Intra-op Plan:   Post-operative Plan: Extubation in OR  Informed Consent: I have reviewed the patients History and Physical, chart, labs and discussed the procedure including the risks, benefits and alternatives for the proposed anesthesia with the patient or authorized representative who has indicated his/her understanding and acceptance.     Dental advisory given  Plan Discussed with: CRNA  Anesthesia Plan Comments:         Anesthesia Quick Evaluation

## 2021-02-12 NOTE — Op Note (Signed)
Operative Note  Preoperative diagnosis:  1.  Left renal calculi  Postoperative diagnosis: 1.  Left renal calculi 2.  Proximal left ureteral stricture  Procedure(s): 1.  Cystoscopy with left retrograde pyelogram, left balloon dilation of ureteropelvic junction stricture, ureteroscopy with laser lithotripsy and ureteral stent placement  Surgeon: Link Snuffer, MD  Assistants: None  Anesthesia: General  Complications: None immediate  EBL: Minimal  Specimens: 1.  Renal calculi  Drains/Catheters: 1.  8 x 26 double-J ureteral stent  Intraoperative findings: 1.  Normal anterior urethra 2.  Borderline obstructing prostate 3.  Bladder mucosa normal without any masses.  Left ureteroscopy revealed a proximal ureteral stricture.  Retrograde pyelogram confirmed ureteral stricture that was short.  There was moderate hydronephrosis.  Multiple renal calculi measuring up to 1.5 to 2 cm fragmented to smaller fragments and basket extracted  Indication: 65 year old male with left renal calculi presents for the above operation.  Description of procedure:  The patient was identified and consent was obtained.  The patient was taken to the operating room and placed in the supine position.  The patient was placed under general anesthesia.  Perioperative antibiotics were administered.  The patient was placed in dorsal lithotomy.  Patient was prepped and draped in a standard sterile fashion and a timeout was performed.  A 21 French rigid cystoscope was advanced into the urethra and into the bladder.  Complete cystoscopy was performed with no abnormal findings.  A sensor wire was advanced up the left ureter and into the kidney under fluoroscopic guidance.  A second wire was advanced up the ureter and into the kidney and the scope was withdrawn.  1 the wires was secured to the drape and the other wire was used to advance a 12 x 14 ureteral access sheath over the wire under continuous fluoroscopic guidance into  the proximal ureter.  The wire and inner sheath were withdrawn.  Digital ureteroscopy was performed.  At the proximalmost portion of the ureter/ureteropelvic junction, there was a ureteral stricture/ureteropelvic junction obstruction.  I shot a retrograde pyelogram through the scope confirming a short narrow segment.  I therefore withdrew the scope and passed a second wire through the sheath and into the kidney.  Under fluoroscopic guidance, I advanced a 4 cm ureteral balloon dilator over the wire and positioned it appropriately and dilated the ureter.  After 2 minutes I deflated the balloon and withdrew it.  I withdrew the wire that was in the sheath and perform digital ureteroscopy that easily passed by the dilated area.  The previous ureteropelvic junction stone had been retropulsed into the upper pole of the kidney.  This as well as several other stone fragments were laser fragmented to smaller fragments.  Larger fragments were basket extracted.  I then withdrew the scope along with the access sheath visualizing the entire ureter upon removal.  There was no evidence of any injury to the ureter.  No ureteral calculi.  I backloaded the wire onto a rigid cystoscope and advanced that into the bladder followed by routine placement of a 8 x 26 double-J ureteral stent.  Fluoroscopy confirmed proximal placement and direct visualization confirmed a good coil within the bladder.  I drained the bladder and withdrew the scope.  Patient tolerated procedure well was stable postoperative.  Plan: Follow-up in at least 2 weeks for stent removal.

## 2021-02-12 NOTE — Progress Notes (Signed)
Pt aware of surgical time change and to arrive at [facility] admitting at 11:15 am.

## 2021-02-12 NOTE — Discharge Instructions (Addendum)

## 2021-02-12 NOTE — Transfer of Care (Signed)
Immediate Anesthesia Transfer of Care Note  Patient: [name]  Procedure(s) Performed: CYSTOSCOPY WITH LEFT URETEROSCOPY, BALLOON DILITATION, HOLMIUM LASER LITHOTRIPSY AND STENT PLACEMENT (Left )  Patient Location: PACU  Anesthesia Type:General  Level of Consciousness: awake, alert  and oriented  Airway & Oxygen Therapy: Patient Spontanous Breathing and Patient connected to face mask oxygen  Post-op Assessment: Report given to RN, Post -op Vital signs reviewed and stable and Patient moving all extremities X 4  Post vital signs: Reviewed and stable  Last Vitals:  Vitals Value Taken Time  BP    Temp    Pulse    Resp    SpO2      Last Pain:  Vitals:   02/12/21 1154  TempSrc:   PainSc: 0-No pain         Complications: No complications documented.

## 2021-02-14 NOTE — Anesthesia Postprocedure Evaluation (Signed)
Anesthesia Post Note  Patient: [name]  Procedure(s) Performed: CYSTOSCOPY WITH LEFT URETEROSCOPY, BALLOON DILITATION, HOLMIUM LASER LITHOTRIPSY AND STENT PLACEMENT (Left )     Patient location during evaluation: PACU Anesthesia Type: General Level of consciousness: awake Pain management: pain level controlled Vital Signs Assessment: post-procedure vital signs reviewed and stable Respiratory status: spontaneous breathing, nonlabored ventilation, respiratory function stable and patient connected to nasal cannula oxygen Cardiovascular status: blood pressure returned to baseline and stable Postop Assessment: no apparent nausea or vomiting Anesthetic complications: no   No complications documented.  Last Vitals:  Vitals:   02/12/21 1730 02/12/21 1745  BP: (!) 155/90 (!) 153/89  Pulse: 85 80  Resp: 18 14  Temp: 36.7 C 36.7 C  SpO2: 92% 93%    Last Pain:  Vitals:   02/12/21 1745  TempSrc:   PainSc: 4                  Cristina Mattern P Sascha Palma

## 2021-02-15 ENCOUNTER — Encounter (HOSPITAL_COMMUNITY): Payer: Self-pay | Admitting: Urology

## 2021-09-01 ENCOUNTER — Ambulatory Visit: Payer: 59 | Admitting: Orthopedic Surgery

## 2021-09-13 ENCOUNTER — Ambulatory Visit: Payer: Self-pay

## 2021-09-13 ENCOUNTER — Other Ambulatory Visit: Payer: Self-pay

## 2021-09-13 ENCOUNTER — Ambulatory Visit (INDEPENDENT_AMBULATORY_CARE_PROVIDER_SITE_OTHER): Payer: 59

## 2021-09-13 ENCOUNTER — Ambulatory Visit (INDEPENDENT_AMBULATORY_CARE_PROVIDER_SITE_OTHER): Payer: 59 | Admitting: Orthopedic Surgery

## 2021-09-13 DIAGNOSIS — M25511 Pain in right shoulder: Secondary | ICD-10-CM

## 2021-09-13 DIAGNOSIS — M25512 Pain in left shoulder: Secondary | ICD-10-CM

## 2021-09-15 ENCOUNTER — Encounter: Payer: Self-pay | Admitting: Orthopedic Surgery

## 2021-09-15 NOTE — Progress Notes (Signed)
Office Visit Note   Patient: [name]           Date of Birth: 05-31-1956           MRN: [id number] Visit Date: 09/13/2021 Requested by: Greig Right, MD [address] [place],  [place] [address] PCP: Greig Right, MD  Subjective: Chief Complaint  Patient presents with   Left Shoulder - Pain   Right Shoulder - Pain    HPI: [name] is a 65 year old patient with bilateral shoulder pain right worse than left.  He also reports some right leg weakness.  He had total knee replacement on that side 7 years ago.  He states "I tore my rotator cuff".  Reports decreased range of motion and significant pain in the right shoulder.  Also the ports that the pain wakes him from sleep at night.  Has occasional clicking.  No prior surgery to either shoulder.  Denies any injury.  Denies any numbness tingling in the neck or radicular pain.  This is been going on for several months.  He also reports overall weakness.  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              ROS: All systems reviewed are negative as they relate to the chief complaint within the history of present illness.  Patient denies  fevers or chills.   Assessment & Plan: Visit Diagnoses:  1. Bilateral shoulder pain, unspecified chronicity   2. Right shoulder pain, unspecified chronicity     Plan: Impression is bilateral external rotation weakness without significant radiographic abnormalities and without accompanying deltoid weakness biceps weakness or triceps weakness.  No real neck pain.  He also has fairly significant hip flexor weakness.  Also describes systemic symptoms of malaise and weakness in general.  Concern at this time is for some type of systemic process that could be affecting his axial spine.  Liver enzymes are elevated by his report.  To start with we will go with MRI arthrogram of the right shoulder to evaluate this external rotation weakness but having bilateral weakness without  history of trauma injury and normal radiographs is difficult to explain.  He also has the right hip flexor weakness.  Right total knee functions well.  Plan MRI arthrogram of the shoulder with follow-up after that.  May need imaging of the cervical spine if that shoulder MRI scan is negative.  Follow-Up Instructions: Return for after MRI.   Orders:  Orders Placed This Encounter  Procedures   XR Shoulder Right   XR Shoulder Left   MR SHOULDER RIGHT W CONTRAST   Arthrogram   No orders of the defined types were placed in this encounter.     Procedures: No procedures performed   Clinical Data: No additional findings.  Objective: Vital Signs: There were no vitals taken for this visit.  Physical Exam:   Constitutional: Patient appears well-developed HEENT:  Head: Normocephalic Eyes:EOM are normal Neck: Normal range of motion Cardiovascular: Normal rate Pulmonary/chest: Effort normal Neurologic: Patient is alert Skin: Skin is warm Psychiatric: Patient has normal mood and affect   Ortho Exam: Ortho exam demonstrates hip flexor weakness on the right of 4- out of 4 compared to the left.  Knee range of motion is excellent.  Ankle dorsiflexion plantarflexion quad and hamstring strength strength both 5 out of 5.  No paresthesias.  He also has external rotation weakness bilaterally but subscap strength is intact.  Deltoid strength  also intact.  No coarse grinding or crepitus with internal ex rotation of either shoulder at 90 degrees of abduction.  Cervical spine range of motion is full.  Negative clonus.  Reflexes symmetric bilateral upper extremities with no paresthesias C5-T1 or L1-S1 bilaterally.  Specialty Comments:  No specialty comments available.  Imaging: No results found.   PMFS History: Patient Active Problem List   Diagnosis Date Noted   Arthritis of knee 09/02/2014   Staghorn kidney stones 03/17/2014   Nephrolithiasis 02/01/2014   Pneumonia 08/27/2011   Diabetes  mellitus 08/27/2011   Past Medical History:  Diagnosis Date   Arthritis    Diabetes mellitus    fasting 120s   History of kidney stones    Hypertension    Peripheral neuropathy    left fifth finger, left toe   Pneumonia    hx of    History reviewed. No pertinent family history.  Past Surgical History:  Procedure Laterality Date   APPENDECTOMY     CYSTOSCOPY W/ URETERAL STENT PLACEMENT Bilateral 03/17/2014   Procedure: CYSTOSCOPY WITH RIGHT STENT PULL  ;  Surgeon: Alexis Frock, MD;  Location: [facility];  Service: Urology;  Laterality: Bilateral;   CYSTOSCOPY WITH RETROGRADE PYELOGRAM, URETEROSCOPY AND STENT PLACEMENT Bilateral 02/01/2014   Procedure: CYSTOSCOPY WITH BILATERAL RETROGRADE CYSTOSCOPY, BILATERAL RETROGRADE, PYELOGRAM, RIGHT URETEROSCOPY, STONE BASKETRY,  AND BILATERAL STENT PLACEMENT;  Surgeon: Alexis Frock, MD;  Location: [facility];  Service: Urology;  Laterality: Bilateral;   CYSTOSCOPY WITH URETEROSCOPY AND STENT PLACEMENT Left 02/12/2021   Procedure: CYSTOSCOPY WITH LEFT URETEROSCOPY, BALLOON DILITATION, HOLMIUM LASER LITHOTRIPSY AND STENT PLACEMENT;  Surgeon: Lucas Mallow, MD;  Location: [facility];  Service: Urology;  Laterality: Left;   HOLMIUM LASER APPLICATION Left 3/82/5053   Procedure: HOLMIUM LASER APPLICATION;  Surgeon: Alexis Frock, MD;  Location: [facility];  Service: Urology;  Laterality: Left;   LITHOTRIPSY     NEPHROLITHOTOMY Left 03/17/2014   Procedure: NEPHROLITHOTOMY PERCUTANEOUS with access;  Surgeon: Alexis Frock, MD;  Location: [facility];  Service: Urology;  Laterality: Left;   NEPHROLITHOTOMY Left 03/19/2014   Procedure: 2ND STAGE NEPHROLITHOTOMY PERCUTANEOUS left ureteroscopy with lasr stone treatment and stent placement, with access;  Surgeon: Alexis Frock, MD;  Location: [facility];  Service: Urology;  Laterality: Left;   TOTAL KNEE ARTHROPLASTY Right 09/02/2014   Procedure: RIGHT TOTAL KNEE ARTHROPLASTY;  Surgeon: Meredith Pel, MD;  Location: [facility];   Service: Orthopedics;  Laterality: Right;   uretral stent     Social History   Occupational History   Not on file  Tobacco Use   Smoking status: Never   Smokeless tobacco: Never  Vaping Use   Vaping Use: Never used  Substance and Sexual Activity   Alcohol use: Yes    Alcohol/week: 2.0 standard drinks    Types: 2 Cans of beer per week    Comment: occasional/ 1-2 beers week   Drug use: No   Sexual activity: Yes    Birth control/protection: Other-see comments

## 2021-09-16 ENCOUNTER — Other Ambulatory Visit: Payer: Self-pay | Admitting: Urology

## 2021-09-16 ENCOUNTER — Telehealth: Payer: Self-pay | Admitting: Orthopedic Surgery

## 2021-09-16 DIAGNOSIS — N138 Other obstructive and reflux uropathy: Secondary | ICD-10-CM

## 2021-09-16 NOTE — Telephone Encounter (Signed)
Called pt 1X and left vm to call and set MRI review appt with Dr. Marlou Sa after 10/08/21

## 2021-10-08 ENCOUNTER — Other Ambulatory Visit: Payer: 59

## 2021-10-13 ENCOUNTER — Ambulatory Visit: Payer: 59 | Admitting: Orthopedic Surgery

## 2021-10-21 ENCOUNTER — Ambulatory Visit
Admission: RE | Admit: 2021-10-21 | Discharge: 2021-10-21 | Disposition: A | Discharge status: Home / Self Care | Payer: PRIVATE HEALTH INSURANCE | Source: Ambulatory Visit | Attending: Orthopedic Surgery | Admitting: Orthopedic Surgery

## 2021-10-21 ENCOUNTER — Other Ambulatory Visit: Payer: Self-pay

## 2021-10-21 DIAGNOSIS — M25511 Pain in right shoulder: Secondary | ICD-10-CM

## 2021-10-21 IMAGING — MR MR SHOULDER*R* W/CM
6 series · 40 of 40 positions shown · IV contrast (agent unspecified)
Comparison: Right shoulder radiographs [DATE]

CLINICAL DATA: Weakness. Evaluate for source of external rotation
weakness. Shoulder and neck pain for 4-5 months.

EXAM:
MR ARTHROGRAM OF THE RIGHT SHOULDER
TECHNIQUE: Multiplanar, multisequence MR imaging of the right shoulder was
performed following the administration of intra-articular contrast.
CONTRAST:  See Injection Documentation.

[Series 3: T1 fat-sat · axial · 4.0mm · 0.27mm/px · z∈[-23,+61]mm · 8 of 18 slices shown (1 of 4)]
[im 1/18]
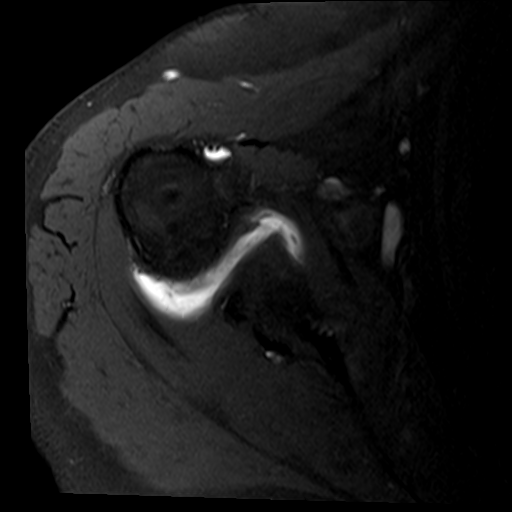
[im 3/18]
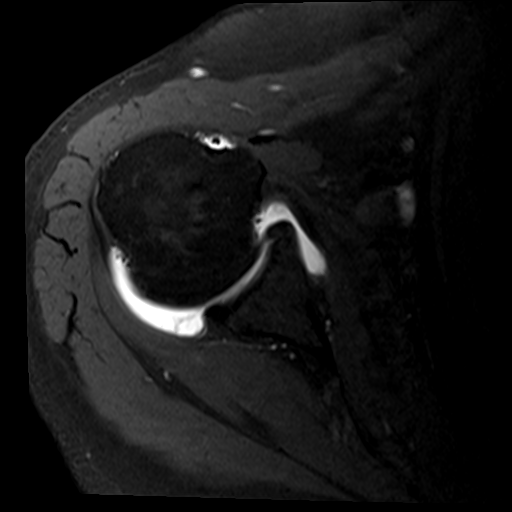
[im 5/18]
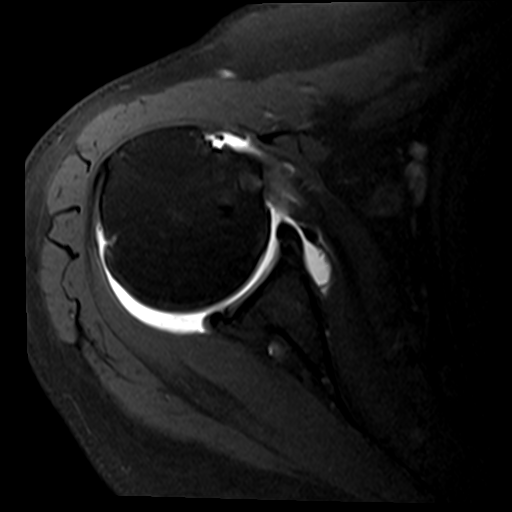
[im 8/18]
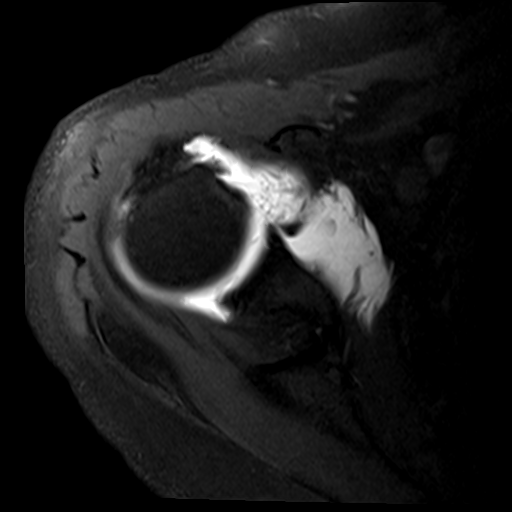
[im 10/18]
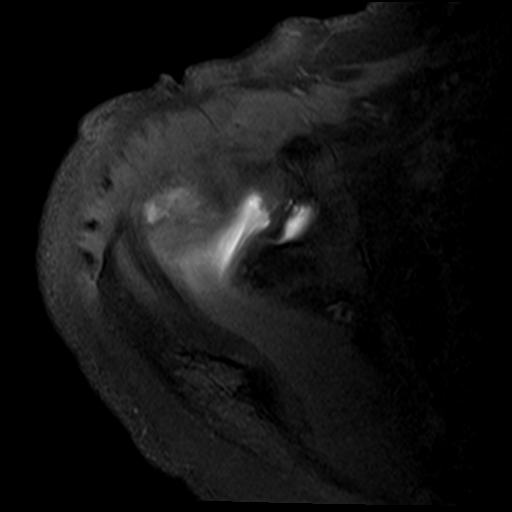
[im 13/18]
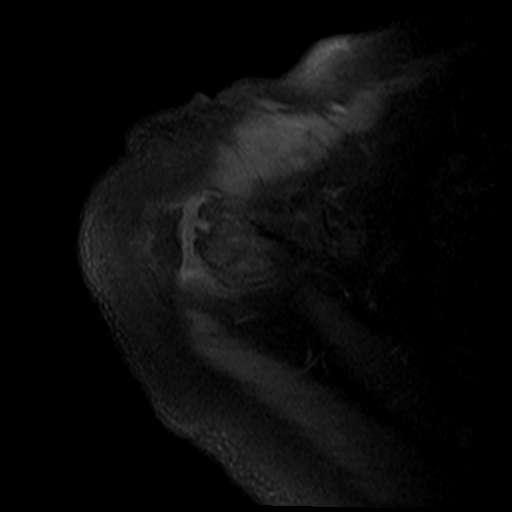
[im 15/18]
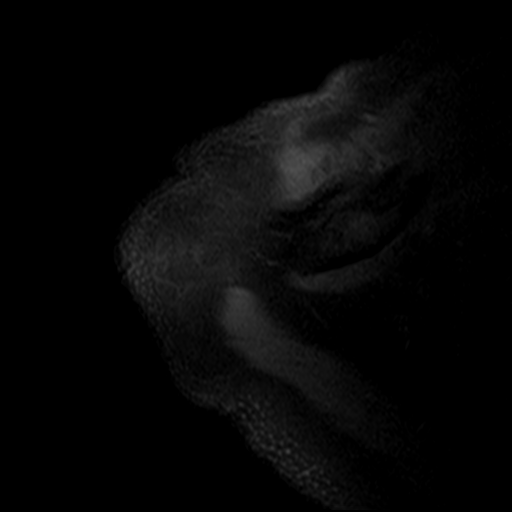
[im 18/18]
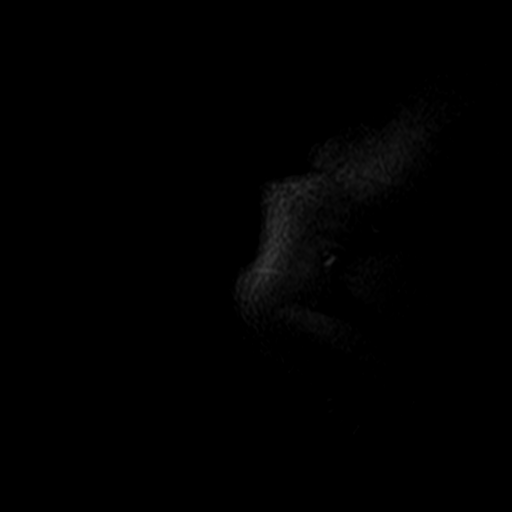

[Series 4: T2 fat-sat · oblique · 4.0mm · 0.55mm/px · 8 of 18 slices shown (1 of 2)]
[im 1/18]
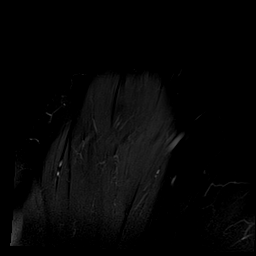
[im 3/18]
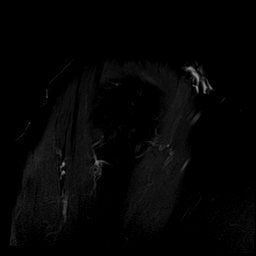
[im 5/18]
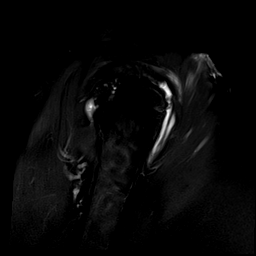
[im 8/18]
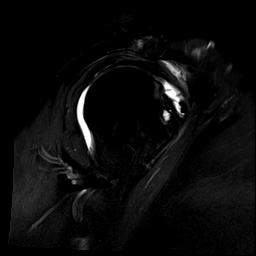
[im 10/18]
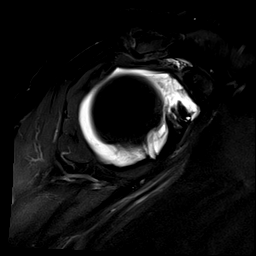
[im 13/18]
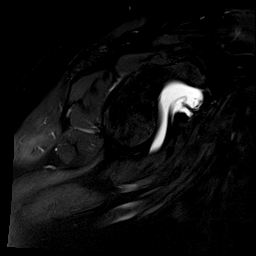
[im 15/18]
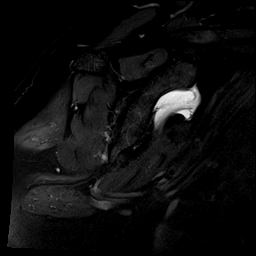
[im 18/18]
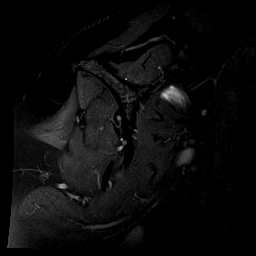

[Series 5: T1 fat-sat · oblique · 4.0mm · 0.55mm/px · 6 of 16 slices shown (2 of 4)]
[im 1/16]
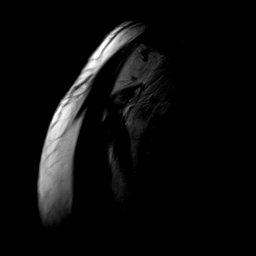
[im 4/16]
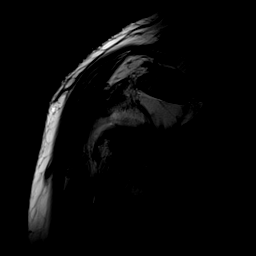
[im 7/16]
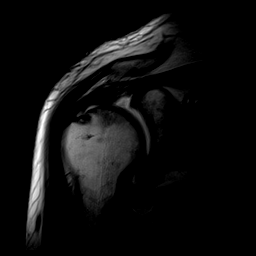
[im 10/16]
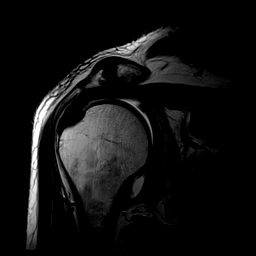
[im 13/16]
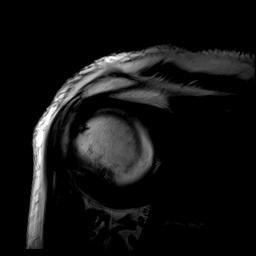
[im 16/16]
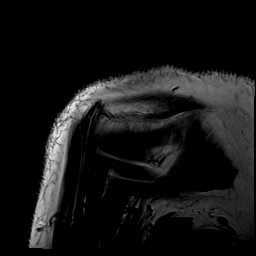

[Series 6: T1 fat-sat · oblique · 4.0mm · 0.55mm/px · 6 of 16 slices shown (3 of 4)]
[im 1/16]
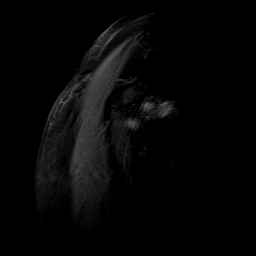
[im 4/16]
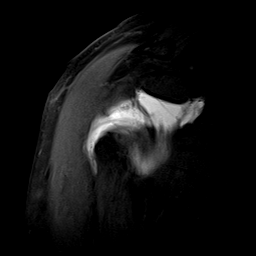
[im 7/16]
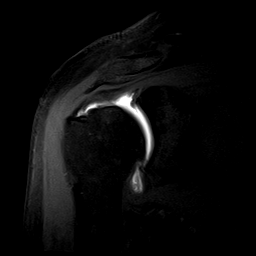
[im 10/16]
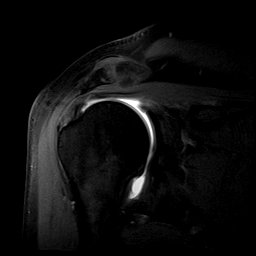
[im 13/16]
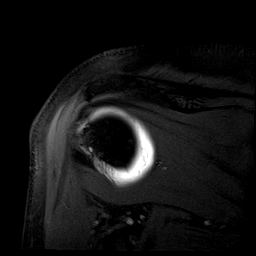
[im 16/16]
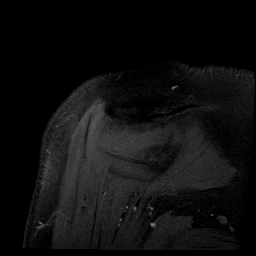

[Series 7: T2 fat-sat · oblique · 4.0mm · 0.55mm/px · 6 of 16 slices shown (2 of 2)]
[im 1/16]
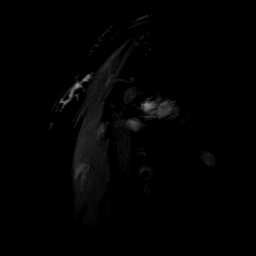
[im 4/16]
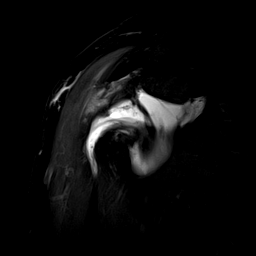
[im 7/16]
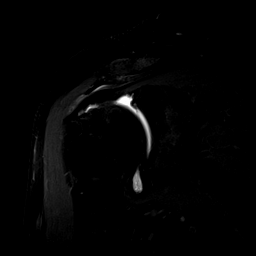
[im 10/16]
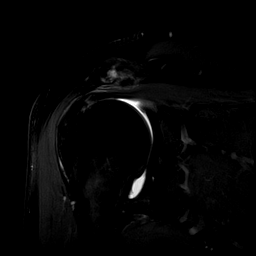
[im 13/16]
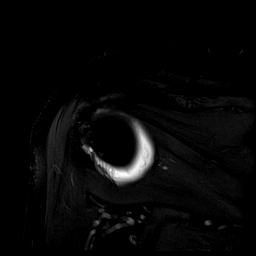
[im 16/16]
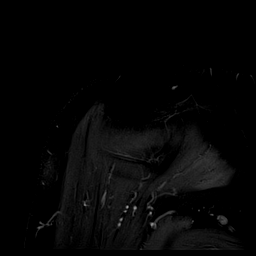

[Series 10: T1 fat-sat · sagittal · 4.0mm · 0.59mm/px · 6 of 16 slices shown (4 of 4)]
[im 1/16]
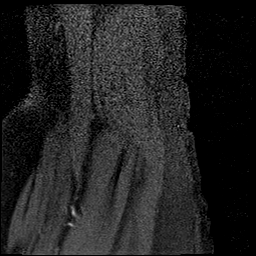
[im 4/16]
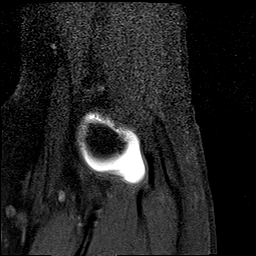
[im 7/16]
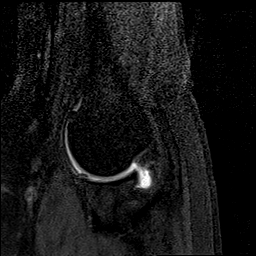
[im 10/16]
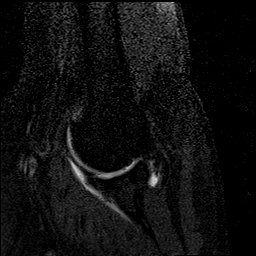
[im 13/16]
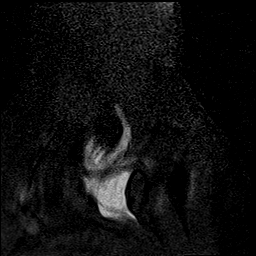
[im 16/16]
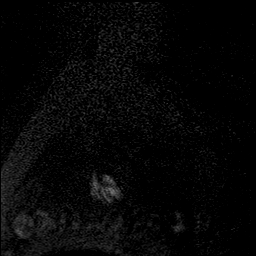

[40 of 40 positions shown; findings below may reference images not displayed]

FINDINGS: Rotator cuff: There is a high-grade partial-thickness articular
sided tear of the anterior supraspinatus tendon footprint measuring
up to 6 mm in AP dimension (sagittal image 15) and 5 mm in
transverse dimension, involving greater than 75% of the transverse
tendon thickness (coronal image 11). No significant tendon
retraction. Mild intermediate T2 signal mid AP dimension of the
infraspinatus tendinosis. There are multiple linear
partial-thickness tears within the superior 40% of the subscapularis
tendon insertion. The teres minor is intact.

Muscles:  Mild supraspinatus muscle atrophy.

Biceps long head: The long head of the biceps tendon is not
visualized from the biceps-labral junction through the bicipital
groove, suggesting a proximal rupture with distal retraction.

Acromioclavicular Joint: There are mild-to-moderate degenerative
changes of the acromioclavicular joint including joint space
narrowing, subchondral marrow edema, and peripheral osteophytosis.
Type II acromion. No subacromial/subdeltoid bursitis.

Glenohumeral Joint: Mild-to-moderate thinning of the glenoid and
inferior medial humeral head cartilage.

Labrum: Mild linear fluid bright signal at the 12 o'clock glenoid
labrum at the expected biceps origin, a small tear (coronal series
7, image 9).

Bones:  No acute fracture.

Other: None.
IMPRESSION: :
IMPRESSION: 1. High-grade partial-thickness articular sided tear of the anterior
supraspinatus measuring up to 6 mm in AP dimension. Mild
supraspinatus muscle atrophy suggests at least a component of this
tear may be chronic.
2. Mild mid AP dimension of the infraspinatus tendinosis.
3. Multiple partial-thickness tears of the superior subscapularis
tendon.
4. Proximal long head of the biceps tendon rupture with distal
tendon retraction.
5. Mild-to-moderate degenerative changes of the acromioclavicular
joint.

## 2021-10-21 IMAGING — XA DG FLUORO GUIDE NDL PLC/BX
1 series · 1 of 1 positions shown · non-contrast
Comparison: none

CLINICAL DATA: Pain with range of motion. No previous surgery or
dislocation.

EXAM:
EXAM
RIGHT SHOULDER INJECTION UNDER FLUOROSCOPY FOR MRI
FLUOROSCOPY TIME:  Radiation Exposure Index (as provided by the
fluoroscopic device): 0.3 mGy Kerma
TECHNIQUE: The procedure, risks (including but not limited to bleeding,
infection, organ damage ), benefits, and alternatives were explained
to the patient. Questions regarding the procedure were encouraged
and answered. The patient understands and consents to the procedure.

[Series 1: ortho standard · 1 of 1 slices shown]
[im 1/1]
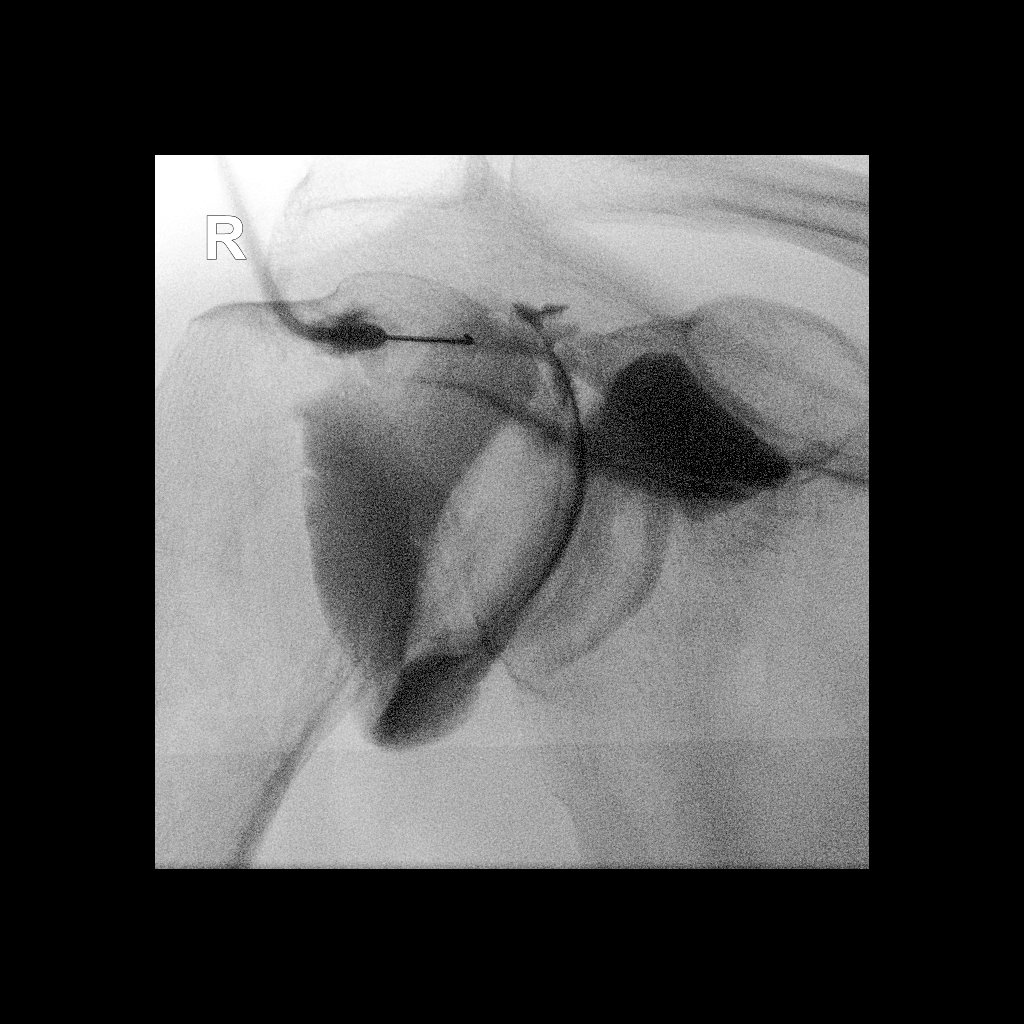

[1 of 1 positions shown; findings below may reference images not displayed]

An appropriate skin entry site was determined under fluoroscopy.
Skin site was marked, prepped with Betadine, and draped in usual
sterile fashion, and infiltrated locally with 1% lidocaine.

22-gauge spinal needle advanced to the superior medial margin of the
humeral head. 1 mL of lidocaine 1% injected easily. 12ml of a
mixture of 20 mL dilute iodinated contrast with 0.1ml Multihance
contrast was injected into the shoulder joint. Intraarticular flow
was confirmed on fluoroscopy. Patient transferred to MRI.

COMPLICATIONS:
COMPLICATIONS
none
IMPRESSION: 1. Technically successful right shoulder injection for MRI

## 2021-10-21 MED ORDER — IOPAMIDOL (ISOVUE-M 200) INJECTION 41%
13.0000 mL | Freq: Once | INTRAMUSCULAR | Status: AC
  Administered 2021-10-21: 13 mL via INTRA_ARTICULAR

## 2021-10-23 ENCOUNTER — Other Ambulatory Visit: Payer: PRIVATE HEALTH INSURANCE

## 2021-10-25 ENCOUNTER — Other Ambulatory Visit: Payer: Self-pay

## 2021-10-25 ENCOUNTER — Ambulatory Visit (INDEPENDENT_AMBULATORY_CARE_PROVIDER_SITE_OTHER): Payer: PRIVATE HEALTH INSURANCE | Admitting: Orthopedic Surgery

## 2021-10-25 DIAGNOSIS — M25511 Pain in right shoulder: Secondary | ICD-10-CM

## 2021-10-25 DIAGNOSIS — M25512 Pain in left shoulder: Secondary | ICD-10-CM

## 2021-10-29 ENCOUNTER — Encounter: Payer: Self-pay | Admitting: Orthopedic Surgery

## 2021-10-29 NOTE — Progress Notes (Signed)
Office Visit Note   Patient: [name]           Date of Birth: 06-May-1956           MRN: [id number] Visit Date: 10/25/2021 Requested by: Greig Right, MD [address] [place],  [place] [address] PCP: Greig Right, MD  Subjective: Chief Complaint  Patient presents with   Other    Scan review    HPI: [name] is a 66 year old patient with right shoulder pain.  Here to review his MRI scan.  The MRI scan is reviewed with [name].  This was an arthrogram study.  Shows near full-thickness supraspinatus tear.  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  He reports primarily pain as opposed to weakness and does not report significant mechanical symptoms since last visit.              ROS: All systems reviewed are negative as they relate to the chief complaint within the history of present illness.  Patient denies  fevers or chills.   Assessment & Plan: Visit Diagnoses:  1. Bilateral shoulder pain, unspecified chronicity     Plan: Impression is significant thickness rotator cuff tear of the supraspinatus.  I think this may become an operative problem in the future.  For now [name] wants to hold off on intervention.  Did discuss with him how this would feel if his rotator cuff tore fully.  Needs to avoid lifting things away from his body.  We will check him back in 3 months for clinical recheck and decision for or against further intervention.  We will hold off on injection for now.  Follow-Up Instructions: Return in about 3 months (around 01/23/2022).   Orders:  No orders of the defined types were placed in this encounter.  No orders of the defined types were placed in this encounter.     Procedures: No procedures performed   Clinical Data: No additional findings.  Objective: Vital Signs: There were no vitals taken for this visit.  Physical Exam:   Constitutional: Patient appears well-developed HEENT:  Head:  Normocephalic Eyes:EOM are normal Neck: Normal range of motion Cardiovascular: Normal rate Pulmonary/chest: Effort normal Neurologic: Patient is alert Skin: Skin is warm Psychiatric: Patient has normal mood and affect   Ortho Exam: Ortho exam demonstrates good cervical spine range of motion.  5 out of 5 grip EPL FPL interosseous wrist flexion extension bicep triceps and deltoid strength.  He has pretty reasonable passive range of motion on the right of 60/95/165.  Not too much coarseness or grinding.  A little bit of pain with external rotation but strength is fairly symmetric compared to the left-hand side.  No AC joint tenderness to direct palpation.  No restriction of external rotation passive range of motion.  Specialty Comments:  No specialty comments available.  Imaging: No results found.   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 No pertinent family history.  Past Surgical History:  Procedure Laterality Date   APPENDECTOMY     CYSTOSCOPY W/ URETERAL STENT PLACEMENT Bilateral 03/17/2014   Procedure: CYSTOSCOPY WITH RIGHT STENT PULL  ;  Surgeon: Alexis Frock, MD;  Location: [facility];  Service: Urology;  Laterality: Bilateral;   CYSTOSCOPY WITH RETROGRADE PYELOGRAM, URETEROSCOPY AND STENT PLACEMENT Bilateral 02/01/2014   Procedure: CYSTOSCOPY WITH BILATERAL RETROGRADE CYSTOSCOPY, BILATERAL RETROGRADE, PYELOGRAM, RIGHT URETEROSCOPY, STONE BASKETRY,  AND BILATERAL STENT PLACEMENT;  Surgeon: Alexis Frock, MD;  Location: [facility];  Service: Urology;  Laterality: Bilateral;    CYSTOSCOPY WITH URETEROSCOPY AND STENT PLACEMENT Left 02/12/2021   Procedure: CYSTOSCOPY WITH LEFT URETEROSCOPY, BALLOON DILITATION, HOLMIUM LASER LITHOTRIPSY AND STENT PLACEMENT;  Surgeon: Lucas Mallow, MD;  Location: [facility];  Service: Urology;  Laterality: Left;   HOLMIUM LASER APPLICATION Left 7/59/1638   Procedure: HOLMIUM LASER APPLICATION;  Surgeon: Alexis Frock, MD;  Location: [facility];  Service: Urology;  Laterality: Left;   LITHOTRIPSY     NEPHROLITHOTOMY Left 03/17/2014   Procedure: NEPHROLITHOTOMY PERCUTANEOUS with access;  Surgeon: Alexis Frock, MD;  Location: [facility];  Service: Urology;  Laterality: Left;   NEPHROLITHOTOMY Left 03/19/2014   Procedure: 2ND STAGE NEPHROLITHOTOMY PERCUTANEOUS left ureteroscopy with lasr stone treatment and stent placement, with access;  Surgeon: Alexis Frock, MD;  Location: [facility];  Service: Urology;  Laterality: Left;   TOTAL KNEE ARTHROPLASTY Right 09/02/2014   Procedure: RIGHT TOTAL KNEE ARTHROPLASTY;  Surgeon: Meredith Pel, MD;  Location: [facility];  Service: Orthopedics;  Laterality: Right;   uretral stent     Social History   Occupational History   Not on file  Tobacco Use   Smoking status: Never   Smokeless tobacco: Never  Vaping Use   Vaping Use: Never used  Substance and Sexual Activity   Alcohol use: Yes    Alcohol/week: 2.0 standard drinks    Types: 2 Cans of beer per week    Comment: occasional/ 1-2 beers week   Drug use: No   Sexual activity: Yes    Birth control/protection: Other-see comments

## 2021-12-27 ENCOUNTER — Encounter (HOSPITAL_COMMUNITY): Payer: Self-pay | Admitting: Student

## 2021-12-27 ENCOUNTER — Emergency Department (HOSPITAL_COMMUNITY): Payer: Medicare Other

## 2021-12-27 ENCOUNTER — Inpatient Hospital Stay (HOSPITAL_COMMUNITY)
Admission: EM | Admit: 2021-12-27 | Discharge: 2021-12-29 | DRG: 247 | Disposition: A | Discharge status: Home / Self Care | Payer: Medicare Other | Attending: Internal Medicine | Admitting: Internal Medicine

## 2021-12-27 DIAGNOSIS — R0902 Hypoxemia: Secondary | ICD-10-CM | POA: Diagnosis not present

## 2021-12-27 DIAGNOSIS — Z7984 Long term (current) use of oral hypoglycemic drugs: Secondary | ICD-10-CM

## 2021-12-27 DIAGNOSIS — I25110 Atherosclerotic heart disease of native coronary artery with unstable angina pectoris: Secondary | ICD-10-CM | POA: Diagnosis present

## 2021-12-27 DIAGNOSIS — Z79899 Other long term (current) drug therapy: Secondary | ICD-10-CM

## 2021-12-27 DIAGNOSIS — Z955 Presence of coronary angioplasty implant and graft: Secondary | ICD-10-CM

## 2021-12-27 DIAGNOSIS — I2510 Atherosclerotic heart disease of native coronary artery without angina pectoris: Secondary | ICD-10-CM | POA: Diagnosis not present

## 2021-12-27 DIAGNOSIS — Z20822 Contact with and (suspected) exposure to covid-19: Secondary | ICD-10-CM | POA: Diagnosis not present

## 2021-12-27 DIAGNOSIS — R079 Chest pain, unspecified: Secondary | ICD-10-CM | POA: Diagnosis not present

## 2021-12-27 DIAGNOSIS — I160 Hypertensive urgency: Secondary | ICD-10-CM | POA: Diagnosis not present

## 2021-12-27 DIAGNOSIS — Z820 Family history of epilepsy and other diseases of the nervous system: Secondary | ICD-10-CM | POA: Diagnosis not present

## 2021-12-27 DIAGNOSIS — I214 Non-ST elevation (NSTEMI) myocardial infarction: Principal | ICD-10-CM | POA: Diagnosis present

## 2021-12-27 DIAGNOSIS — C61 Malignant neoplasm of prostate: Secondary | ICD-10-CM | POA: Diagnosis not present

## 2021-12-27 DIAGNOSIS — E119 Type 2 diabetes mellitus without complications: Secondary | ICD-10-CM | POA: Diagnosis not present

## 2021-12-27 DIAGNOSIS — F419 Anxiety disorder, unspecified: Secondary | ICD-10-CM | POA: Diagnosis not present

## 2021-12-27 DIAGNOSIS — Z794 Long term (current) use of insulin: Secondary | ICD-10-CM | POA: Diagnosis not present

## 2021-12-27 DIAGNOSIS — E1140 Type 2 diabetes mellitus with diabetic neuropathy, unspecified: Secondary | ICD-10-CM | POA: Diagnosis not present

## 2021-12-27 DIAGNOSIS — Z8249 Family history of ischemic heart disease and other diseases of the circulatory system: Secondary | ICD-10-CM

## 2021-12-27 DIAGNOSIS — I10 Essential (primary) hypertension: Secondary | ICD-10-CM | POA: Diagnosis not present

## 2021-12-27 DIAGNOSIS — E785 Hyperlipidemia, unspecified: Secondary | ICD-10-CM | POA: Diagnosis not present

## 2021-12-27 DIAGNOSIS — R0789 Other chest pain: Secondary | ICD-10-CM | POA: Diagnosis not present

## 2021-12-27 DIAGNOSIS — E7800 Pure hypercholesterolemia, unspecified: Secondary | ICD-10-CM | POA: Diagnosis present

## 2021-12-27 DIAGNOSIS — R231 Pallor: Secondary | ICD-10-CM | POA: Diagnosis not present

## 2021-12-27 LAB — CBC WITH DIFFERENTIAL/PLATELET
Abs Immature Granulocytes: 0.08 10*3/uL — ABNORMAL HIGH (ref 0.00–0.07)
Basophils Absolute: 0.1 10*3/uL (ref 0.0–0.1)
Basophils Relative: 1 %
Eosinophils Absolute: 0.2 10*3/uL (ref 0.0–0.5)
Eosinophils Relative: 2 %
HCT: 42.4 % (ref 39.0–52.0)
Hemoglobin: 14.1 g/dL (ref 13.0–17.0)
Immature Granulocytes: 1 %
Lymphocytes Relative: 21 %
Lymphs Abs: 2.3 10*3/uL (ref 0.7–4.0)
MCH: 31.3 pg (ref 26.0–34.0)
MCHC: 33.3 g/dL (ref 30.0–36.0)
MCV: 94 fL (ref 80.0–100.0)
Monocytes Absolute: 0.9 10*3/uL (ref 0.1–1.0)
Monocytes Relative: 8 %
Neutro Abs: 7.5 10*3/uL (ref 1.7–7.7)
Neutrophils Relative %: 67 %
Platelets: 288 10*3/uL (ref 150–400)
RBC: 4.51 MIL/uL (ref 4.22–5.81)
RDW: 13.3 % (ref 11.5–15.5)
WBC: 10.9 10*3/uL — ABNORMAL HIGH (ref 4.0–10.5)
nRBC: 0 % (ref 0.0–0.2)

## 2021-12-27 LAB — RESP PANEL BY RT-PCR (FLU A&B, COVID) ARPGX2
Influenza A by PCR: NEGATIVE
Influenza B by PCR: NEGATIVE
SARS Coronavirus 2 by RT PCR: NEGATIVE

## 2021-12-27 LAB — COMPREHENSIVE METABOLIC PANEL
ALT: 341 U/L — ABNORMAL HIGH (ref 0–44)
AST: 160 U/L — ABNORMAL HIGH (ref 15–41)
Albumin: 3.7 g/dL (ref 3.5–5.0)
Alkaline Phosphatase: 54 U/L (ref 38–126)
Anion gap: 10 (ref 5–15)
BUN: 14 mg/dL (ref 8–23)
CO2: 26 mmol/L (ref 22–32)
Calcium: 8.9 mg/dL (ref 8.9–10.3)
Chloride: 105 mmol/L (ref 98–111)
Creatinine, Ser: 0.5 mg/dL — ABNORMAL LOW (ref 0.61–1.24)
GFR, Estimated: >60 mL/min (ref >60)
Glucose, Bld: 176 mg/dL — ABNORMAL HIGH (ref 70–99)
Potassium: 3.7 mmol/L (ref 3.5–5.1)
Sodium: 141 mmol/L (ref 135–145)
Total Bilirubin: 0.6 mg/dL (ref 0.3–1.2)
Total Protein: 6.7 g/dL (ref 6.5–8.1)

## 2021-12-27 LAB — PROTIME-INR
INR: 1 (ref 0.8–1.2)
Prothrombin Time: 13.1 seconds (ref 11.4–15.2)

## 2021-12-27 LAB — TROPONIN I (HIGH SENSITIVITY)
Troponin I (High Sensitivity): 77 ng/L — ABNORMAL HIGH (ref <18)
Troponin I (High Sensitivity): 88 ng/L — ABNORMAL HIGH (ref <18)

## 2021-12-27 LAB — MAGNESIUM: Magnesium: 1.7 mg/dL (ref 1.7–2.4)

## 2021-12-27 LAB — BRAIN NATRIURETIC PEPTIDE: B Natriuretic Peptide: 129 pg/mL — ABNORMAL HIGH (ref 0.0–100.0)

## 2021-12-27 LAB — LIPASE, BLOOD: Lipase: 25 U/L (ref 11–51)

## 2021-12-27 LAB — CBG MONITORING, ED: Glucose-Capillary: 166 mg/dL — ABNORMAL HIGH (ref 70–99)

## 2021-12-27 IMAGING — DX DG CHEST 1V PORT
1 series · 1 of 1 positions shown · non-contrast
Comparison: Chest x-ray dated [DATE]

CLINICAL DATA: Chest pressure

EXAM:
PORTABLE CHEST 1 VIEW

[chest ap]
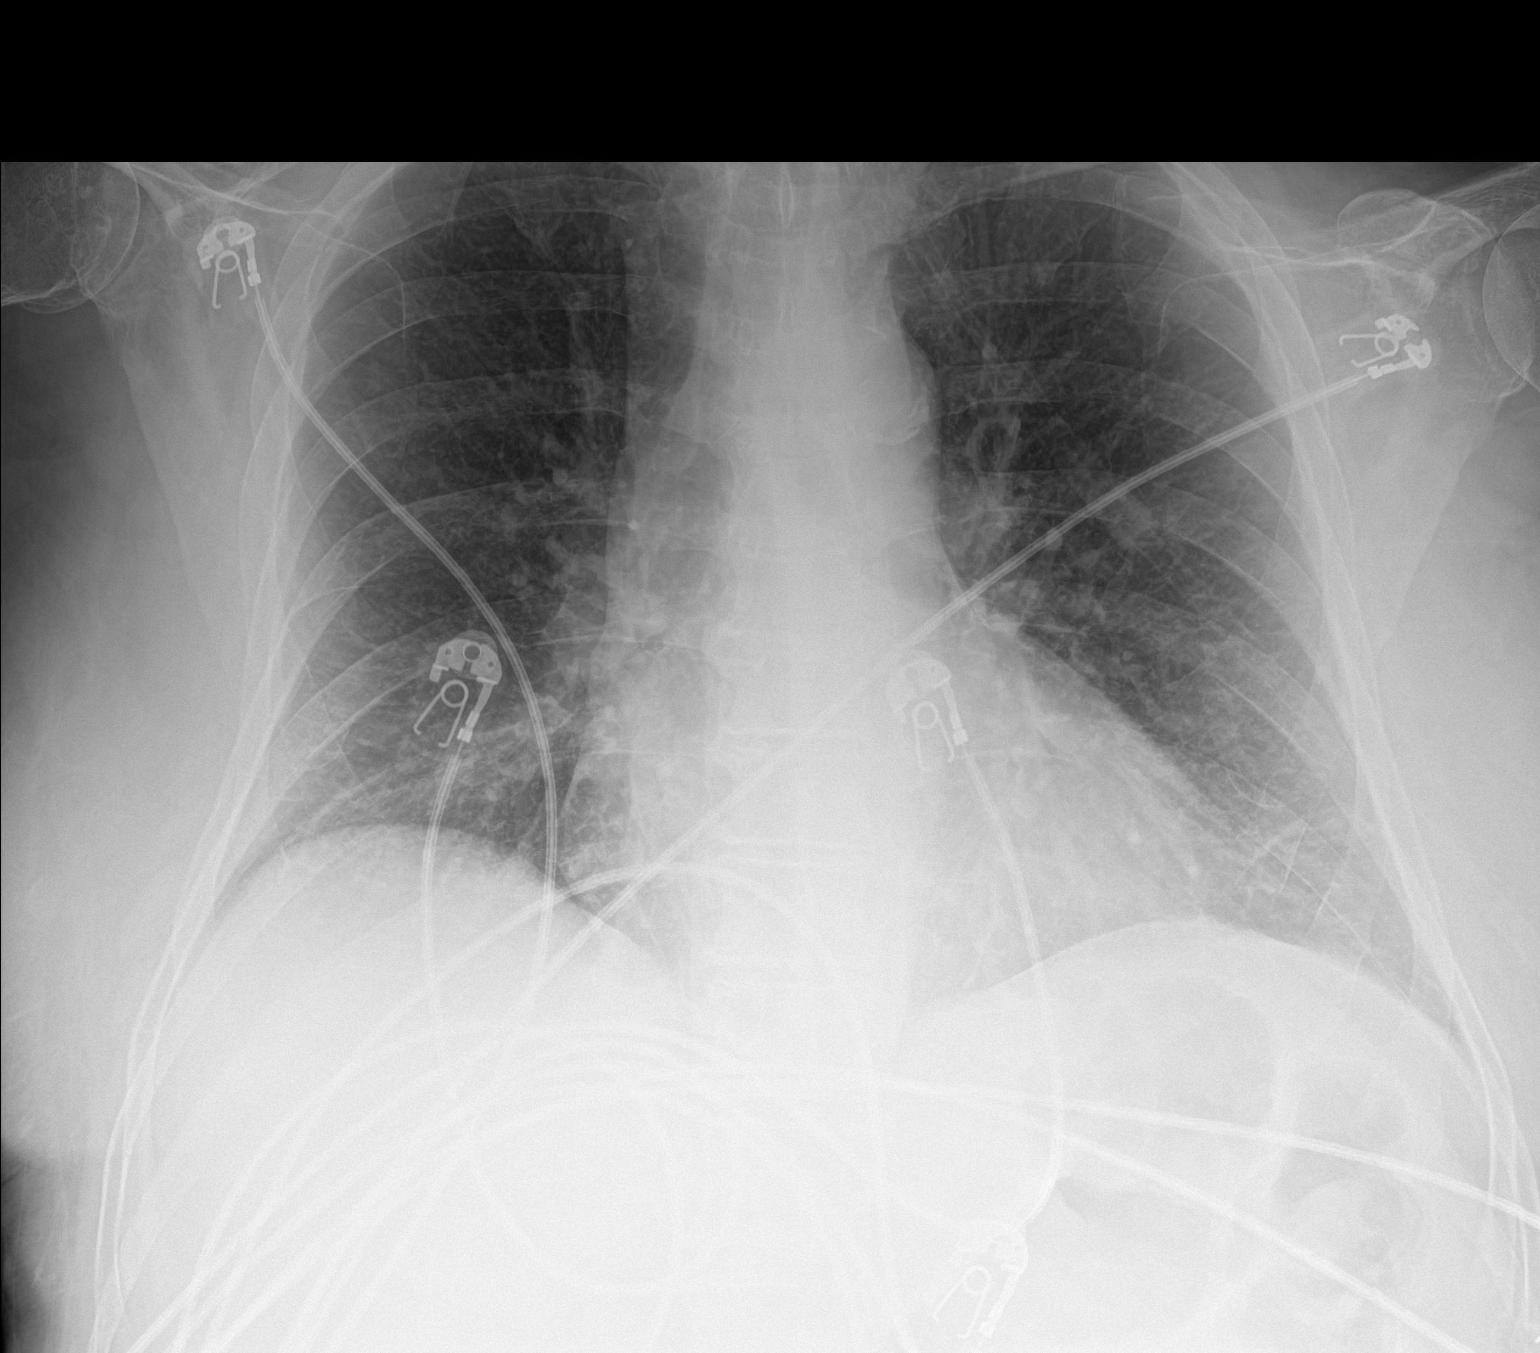

[1 of 1 positions shown; findings below may reference images not displayed]

FINDINGS: Cardiac and mediastinal contours within normal limits. Mild
bibasilar opacities, likely due to atelectasis. No large effusion
evidence of pneumothorax.
IMPRESSION: No active disease.

## 2021-12-27 MED ORDER — CITALOPRAM HYDROBROMIDE 20 MG PO TABS
20.0000 mg | ORAL_TABLET | Freq: Every day | ORAL | Status: DC
  Administered 2021-12-28 – 2021-12-29 (×2): 20 mg via ORAL
  Filled 2021-12-27: qty 2
  Filled 2021-12-27: qty 1

## 2021-12-27 MED ORDER — FUROSEMIDE 10 MG/ML IJ SOLN
40.0000 mg | Freq: Once | INTRAMUSCULAR | Status: AC
  Administered 2021-12-27: 40 mg via INTRAVENOUS
  Filled 2021-12-27: qty 4

## 2021-12-27 MED ORDER — CARVEDILOL 6.25 MG PO TABS
6.2500 mg | ORAL_TABLET | Freq: Two times a day (BID) | ORAL | Status: DC
  Administered 2021-12-27 – 2021-12-28 (×2): 6.25 mg via ORAL
  Filled 2021-12-27 (×2): qty 2

## 2021-12-27 MED ORDER — HEPARIN (PORCINE) 25000 UT/250ML-% IV SOLN
950.0000 [IU]/h | INTRAVENOUS | Status: DC
  Administered 2021-12-27: 950 [IU]/h via INTRAVENOUS
  Filled 2021-12-27: qty 250

## 2021-12-27 MED ORDER — INSULIN ASPART 100 UNIT/ML IJ SOLN
0.0000 [IU] | Freq: Three times a day (TID) | INTRAMUSCULAR | Status: DC
  Administered 2021-12-28: 3 [IU] via SUBCUTANEOUS

## 2021-12-27 MED ORDER — INSULIN GLARGINE-YFGN 100 UNIT/ML ~~LOC~~ SOLN
15.0000 [IU] | Freq: Every day | SUBCUTANEOUS | Status: DC
  Administered 2021-12-28 (×2): 15 [IU] via SUBCUTANEOUS
  Filled 2021-12-27 (×5): qty 0.15

## 2021-12-27 MED ORDER — ASPIRIN EC 81 MG PO TBEC
81.0000 mg | DELAYED_RELEASE_TABLET | Freq: Every day | ORAL | Status: DC
  Administered 2021-12-28 – 2021-12-29 (×2): 81 mg via ORAL
  Filled 2021-12-27 (×2): qty 1

## 2021-12-27 MED ORDER — HEPARIN BOLUS VIA INFUSION
4000.0000 [IU] | Freq: Once | INTRAVENOUS | Status: AC
  Administered 2021-12-27: 4000 [IU] via INTRAVENOUS
  Filled 2021-12-27: qty 4000

## 2021-12-27 MED ORDER — FENTANYL CITRATE PF 50 MCG/ML IJ SOSY
50.0000 ug | PREFILLED_SYRINGE | Freq: Once | INTRAMUSCULAR | Status: AC
  Administered 2021-12-27: 50 ug via INTRAVENOUS
  Filled 2021-12-27: qty 1

## 2021-12-27 MED ORDER — AMLODIPINE BESYLATE 10 MG PO TABS
10.0000 mg | ORAL_TABLET | Freq: Every morning | ORAL | Status: DC
  Administered 2021-12-27 – 2021-12-29 (×3): 10 mg via ORAL
  Filled 2021-12-27: qty 2
  Filled 2021-12-27: qty 1
  Filled 2021-12-27: qty 2

## 2021-12-27 MED ORDER — LACTATED RINGERS IV SOLN: INTRAVENOUS | Status: DC

## 2021-12-27 MED ORDER — NITROGLYCERIN 2 % TD OINT
1.0000 [in_us] | TOPICAL_OINTMENT | Freq: Once | TRANSDERMAL | Status: AC
  Administered 2021-12-27: 1 [in_us] via TOPICAL
  Filled 2021-12-27 (×2): qty 1

## 2021-12-27 MED ORDER — NITROGLYCERIN 0.4 MG SL SUBL: 0.4000 mg | SUBLINGUAL_TABLET | SUBLINGUAL | Status: DC | PRN

## 2021-12-27 MED ORDER — NITROGLYCERIN IN D5W 200-5 MCG/ML-% IV SOLN
0.0000 ug/min | INTRAVENOUS | Status: DC
  Administered 2021-12-27: 5 ug/min via INTRAVENOUS
  Filled 2021-12-27: qty 250

## 2021-12-27 MED ORDER — ONDANSETRON HCL 4 MG/2ML IJ SOLN
4.0000 mg | Freq: Four times a day (QID) | INTRAMUSCULAR | Status: DC | PRN
Start: 2021-12-27 — End: 2021-12-28

## 2021-12-27 NOTE — Progress Notes (Signed)
ANTICOAGULATION CONSULT NOTE - Initial Consult ? ?Pharmacy Consult for IV heparin ?Indication: chest pain/ACS ? ?No Known Allergies ? ?Patient Measurements: ?Height: '5\' 10"'$  (177.8 cm) ?Weight: 81.6 kg (180 lb) ?IBW/kg (Calculated) : 73 ?Heparin Dosing Weight: 81.6 kg ? ?Vital Signs: ?Temp: 99 ?F (37.2 ?C) (04/03 1753) ?Temp Source: Oral (04/03 1755) ?BP: 175/121 (04/03 1830) ?Pulse Rate: 99 (04/03 1830) ? ?Labs: ?Recent Labs  ?  12/27/21 ?1823  ?HGB 14.1  ?HCT 42.4  ?PLT 288  ?LABPROT 13.1  ?INR 1.0  ? ? ?CrCl cannot be calculated (Patient's most recent lab result is older than the maximum 21 days allowed.). ? ? ?Medical History: ?Past Medical History:  ?Diagnosis Date  ? Arthritis   ? Diabetes mellitus   ? fasting 120s  ? History of kidney stones   ? Hypertension   ? Peripheral neuropathy   ? left fifth finger, left toe  ? Pneumonia   ? hx of  ? ?Assessment: ?66 yo male presenting with chest pain and 4-6 month history of intermittent shortness of breath and weakness in his legs. Pharmacy is consulted to start heparin.  ? ?Hemoglobin 14.1 and platelets 288, both within normal limits. ? ?Goal of Therapy:  ?Heparin level 0.3-0.7 units/ml ?Monitor platelets by anticoagulation protocol: Yes ?  ?Plan:  ?Heparin 4000 unit bolus x1 followed by heparin 950 units/hr ?8h heparin level ?Monitor for s/sx of bleeding  ? ?Thank you for including pharmacy in the care of this patient. ? ?Zenaida Deed, PharmD ?PGY1 Acute Care Pharmacy Resident  ?Phone: [phone] ?12/27/2021  7:17 PM ? ?Please check [email or web address] for unit-specific pharmacy phone numbers. ? ? ? ?

## 2021-12-27 NOTE — ED Triage Notes (Signed)
Pt coming from home with [facility]. Pt complains of centralized chest pressure. Denies radiating pain. Pt states he began feeling SHOB while mowing his lawn today. Recovering from a cold that he's had for the past week. Pt has not taken any of his meds today. O2 85% on RA. Currently 95% on 3L [place]. EMS given 324 of aspirin. BP 168/98, HR 98, CBG 186, 20g in RAC.  ?

## 2021-12-27 NOTE — ED Provider Notes (Signed)
?[facility] ?Provider Note ? ? ?CSN: [id number] ?Arrival date & time: 12/27/21  1743 ? ?  ? ?History ? ?Chief Complaint  ?Patient presents with  ? Chest Pain  ? Shortness of Breath  ? ? ?[name] is a 66 y.o. male. ? ?HPI ?Patient presenting for chest pain and shortness of breath.  Medical history includes DM, arthritis, HTN, neuropathy, kidney stones.  At around 4 PM, patient was mowing his grass.  This was done by a push mower.  He reports that it was mildly exertional to him.  He had a sudden onset of a central chest pressure, shortness of breath, diaphoresis, and nausea.  EMS was called.  EMS noted hypoxia on scene.  He was placed on 3 L of supplemental oxygen.  They also provided 324 of ASA.  Currently, patient endorses continued chest pressure and shortness of breath.  He denies any current nausea.  Last week, he did have a cough and cold symptoms.  He reports that these were improving.  His current symptoms are different from the chest tightness he was experiencing from his cold ?HPI: A 66 year old patient with a history of treated diabetes, hypertension and hypercholesterolemia presents for evaluation of chest pain. Initial onset of pain was less than one hour ago. The patient's chest pain is described as heaviness/pressure/tightness and is worse with exertion. The patient complains of nausea and reports some diaphoresis. The patient's chest pain is middle- or left-sided, is not well-localized, is not sharp and does not radiate to the arms/jaw/neck. The patient has no history of stroke, has no history of peripheral artery disease, has not smoked in the past 90 days, has no relevant family history of coronary artery disease (first degree relative at less than age 39) and does not have an elevated BMI (>=30).  ? ?Home Medications ?Prior to Admission medications   ?Medication Sig Start Date End Date Taking? Authorizing Provider  ?amLODipine (NORVASC) 10 MG tablet Take  10 mg by mouth every morning.   Yes [provider]  ?cholecalciferol (VITAMIN D) 1000 UNITS tablet Take 1,000 Units by mouth daily.   Yes [provider]  ?citalopram (CELEXA) 20 MG tablet Take 20 mg by mouth daily.   Yes [provider]  ?Cyanocobalamin (VITAMIN B-12) 2500 MCG SUBL Place 1 tablet under the tongue daily.   Yes [provider]  ?cyclobenzaprine (FLEXERIL) 10 MG tablet TAKE 1 TABLET BY MOUTH TWICE DAILY ?Patient taking differently: Take 10 mg by mouth daily as needed for muscle spasms. 07/10/17  Yes Meredith Pel, MD  ?diclofenac Sodium (VOLTAREN) 1 % GEL Apply 2 g topically 4 (four) times daily. ?Patient taking differently: Apply 2 g topically daily as needed (For pain). 01/26/21  Yes Quintella Reichert, MD  ?hydrochlorothiazide (HYDRODIURIL) 25 MG tablet Take 12.5 mg by mouth daily. 10/20/20  Yes [provider]  ?insulin glargine (LANTUS) 100 UNIT/ML injection Inject 30 Units into the skin at bedtime.    Yes [provider]  ?lisinopril (PRINIVIL,ZESTRIL) 40 MG tablet Take 40 mg by mouth every morning.    Yes [provider]  ?metFORMIN (GLUCOPHAGE) 500 MG tablet Take 1,000 mg by mouth 2 (two) times daily with a meal.    Yes [provider]  ?Multiple Vitamins-Minerals (MULTIVITAMINS THER. W/MINERALS) TABS Take 2 tablets by mouth daily.   Yes [provider]  ?Omega-3 Fatty Acids (FISH OIL) 1200 MG CAPS Take 1,200 mg by mouth daily.   Yes [provider]  ?pioglitazone (ACTOS) 30 MG tablet Take 30 mg by mouth daily after lunch.   Yes [provider]  ?HYDROcodone-acetaminophen (NORCO) 5-325 MG tablet Take 1 tablet by mouth every 4 (four) hours as needed for moderate pain. ?Patient not taking: Reported on 12/27/2021 02/12/21   Lucas Mallow, MD  ?oxyCODONE-acetaminophen (PERCOCET/ROXICET) 5-325 MG tablet Take 1 tablet by mouth every 6 (six) hours as needed for severe pain. ?Patient not taking:  Reported on 12/27/2021 01/26/21   Quintella Reichert, MD  ?   ? ?Allergies    ?Patient has no known allergies.   ? ?Review of Systems   ?Review of Systems  ?Constitutional:  Positive for diaphoresis.  ?Respiratory:  Positive for shortness of breath.   ?Cardiovascular:  Positive for chest pain.  ?Gastrointestinal:  Positive for nausea.  ?Psychiatric/Behavioral:  The patient is nervous/anxious.   ? ?Physical Exam ?Updated Vital Signs ?BP 123/75   Pulse 79   Temp 98 ?F (36.7 ?C) (Oral)   Resp 19   Ht '5\' 10"'$  (1.778 m)   Wt 81.6 kg   SpO2 96%   BMI 25.83 kg/m?  ?Physical Exam ?Vitals and nursing note reviewed.  ?Constitutional:   ?   General: He is not in acute distress. ?   Appearance: He is well-developed and normal weight. He is ill-appearing. He is not toxic-appearing or diaphoretic.  ?HENT:  ?   Head: Normocephalic and atraumatic.  ?Eyes:  ?   Conjunctiva/sclera: Conjunctivae normal.  ?Cardiovascular:  ?   Rate and Rhythm: Normal rate and regular rhythm.  ?   Heart sounds: Murmur heard.  ?Pulmonary:  ?   Effort: Tachypnea present. No accessory muscle usage or respiratory distress.  ?   Breath sounds: Normal breath sounds. No decreased breath sounds, wheezing, rhonchi or rales.  ?Chest:  ?   Chest wall: No tenderness.  ?Abdominal:  ?   Palpations: Abdomen is soft.  ?   Tenderness: There is no abdominal tenderness.  ?Musculoskeletal:     ?   General: No swelling.  ?   Cervical back: Normal range of motion and neck supple.  ?   Right lower leg: No edema.  ?   Left lower leg: No edema.  ?Skin: ?   General: Skin is warm and dry.  ?   Capillary Refill: Capillary refill takes less than 2 seconds.  ?Neurological:  ?   General: No focal deficit present.  ?   Mental Status: He is alert and oriented to person, place, and time.  ?   Cranial Nerves: No cranial nerve deficit.  ?   Motor: No weakness.  ?Psychiatric:     ?   Mood and Affect: Mood is anxious.     ?   Behavior: Behavior normal.  ? ? ?ED Results / Procedures /  Treatments   ?Labs ?(all labs ordered are listed, but only abnormal results are displayed) ?Labs Reviewed  ?COMPREHENSIVE METABOLIC PANEL - Abnormal; Notable for the following components:  ?    Result Value  ? Glucose, Bld 176 (*)   ? Creatinine, Ser 0.50 (*)   ? AST 160 (*)   ? ALT 341 (*)   ? All other components within normal limits  ?BRAIN NATRIURETIC PEPTIDE - Abnormal; Notable for the following components:  ? B Natriuretic Peptide 129.0 (*)   ? All other components within normal limits  ?CBC WITH DIFFERENTIAL/PLATELET - Abnormal; Notable for the following components:  ? WBC 10.9 (*)   ?  Abs Immature Granulocytes 0.08 (*)   ? All other components within normal limits  ?HEPARIN LEVEL (UNFRACTIONATED) - Abnormal; Notable for the following components:  ? Heparin Unfractionated <0.10 (*)   ? All other components within normal limits  ?LIPID PANEL - Abnormal; Notable for the following components:  ? Cholesterol 272 (*)   ? Triglycerides 198 (*)   ? LDL Cholesterol 191 (*)   ? All other components within normal limits  ?CBC - Abnormal; Notable for the following components:  ? WBC 13.0 (*)   ? All other components within normal limits  ?COMPREHENSIVE METABOLIC PANEL - Abnormal; Notable for the following components:  ? Potassium 3.4 (*)   ? Glucose, Bld 138 (*)   ? Creatinine, Ser 0.49 (*)   ? AST 152 (*)   ? ALT 337 (*)   ? All other components within normal limits  ?CBG MONITORING, ED - Abnormal; Notable for the following components:  ? Glucose-Capillary 166 (*)   ? All other components within normal limits  ?CBG MONITORING, ED - Abnormal; Notable for the following components:  ? Glucose-Capillary 122 (*)   ? All other components within normal limits  ?CBG MONITORING, ED - Abnormal; Notable for the following components:  ? Glucose-Capillary 166 (*)   ? All other components within normal limits  ?CBG MONITORING, ED - Abnormal; Notable for the following components:  ? Glucose-Capillary 104 (*)   ? All other components  within normal limits  ?TROPONIN I (HIGH SENSITIVITY) - Abnormal; Notable for the following components:  ? Troponin I (High Sensitivity) 77 (*)   ? All other components within normal limits  ?TROPONIN I (HIGH SENSITIVITY

## 2021-12-27 NOTE — H&P (Addendum)
?Cardiology Admission History and Physical:  ? ?Patient ID: [name] ?MRN: [id number]; DOB: 08/03/1956  ? ?Admission date: 12/27/2021 ? ?PCP:  Penner, Pamela, MD ?  ?[facility] Providers ?Cardiologist:  New ? ? ?Chief Complaint:  chest pain and shortness of breath ? ?Patient Profile:  ? ?[name] is a 65 y.o. male with a history of hypertension, hyperlipidemia, type 2 diabetes, nephrolithiasis, stage I prostate cancer, and recent transaminitis who is being seen 12/27/2021 for the evaluation of chest pain at the request of Dr. Dixon. ? ?History of Present Illness:  ? ?[name] is a 65-year-old male with a history of hypertension, hyperlipidemia, and type 2 diabetes. No prior cardiac history. He denies any real smoking history (smoked a few cigarettes as a teenager but nothing since then). He does have a family history of CAD with his father having possible heart attack. ? ?Patient presented to the ED today for further evaluation of chest pain and shortness of breath. He was push-mowing his lawn when all of a sudden he developed significant leg weakness followed by significant shortness of breath and chest pressure that he described as a "couple hundred pounds" sitting on his chest. He sat down to try to catch his breath but pain persisted for 20 to 30 minutes so he called 911. No prior chest pain with this. He has had some shortness of breath with mowing the law before but nothing to this extent. He describes some orthopnea here in the hospital but none at home. He had an upper respiratory infection about 2 weeks ago with nasal congestion/drainage and cough. He took over the counter medications with this and symptoms have improved but he still is having some post-nasal drip and productive cough. He reports some intermittent dizziness over the last 6-9  months with some balance issues and falls. His Gabapentin was stopped because of this. He thinks anxiety may be contributing to that. He reports  intermittent heart racing but no syncope. No lower extremity edema on exam. ? ?He also report progressive weakness over the last 4-6 months and states sometimes he gets so weak his has trouble brushing his hair. In addition, he was recently found to have elevated LFTs at his PCP's office. His PCP was initially concerned this could be due to his alcohol use (reports drinking a few beers throughout the week and then a Bloody Mary on the weekend). He reportedly had a liver ultrasound which looked OK but has been referred to GI. He also report that he was recently diagnosed with stage I prostate cancer and has been seen by Urology and has a prostate MRI planned for 01/03/2022. ? ?In the ED, EKG shows normal sinus rhythm with no acute ischemic changes. Initial high-sensitivity troponin 77. BNP 129. Chest x-ray showed no acute findings. WBC 10.9, Hgb 14.1, Plts 288. Na 141, K 3.7, Glucose 176, BUN 14, Cr 0.50. Albumin 3.7, AST 160, ALT 341, Alk Phos 54, Total Bili 0.6. Lipase normal. Respiratory panel negative for COVID and influenza. ? ?At the time of this evaluation, patient resting comfortably and does not look to be in any distress. His BP is markedly elevated as high as 218/125. He states he continues to have 6/10 chest pain but improved from prior to admission.  ? ?Past Medical History:  ?Diagnosis Date  ? Arthritis   ? Diabetes mellitus   ? fasting 120s  ? History of kidney stones   ? Hypertension   ? Peripheral neuropathy   ?   left fifth finger, left toe  ? Pneumonia   ? hx of  ? ? ?Past Surgical History:  ?Procedure Laterality Date  ? APPENDECTOMY    ? CYSTOSCOPY W/ URETERAL STENT PLACEMENT Bilateral 03/17/2014  ? Procedure: CYSTOSCOPY WITH RIGHT STENT PULL  ;  Surgeon: Theodore Manny, MD;  Location: [facility];  Service: Urology;  Laterality: Bilateral;  ? CYSTOSCOPY WITH RETROGRADE PYELOGRAM, URETEROSCOPY AND STENT PLACEMENT Bilateral 02/01/2014  ? Procedure: CYSTOSCOPY WITH BILATERAL RETROGRADE CYSTOSCOPY, BILATERAL  RETROGRADE, PYELOGRAM, RIGHT URETEROSCOPY, STONE BASKETRY,  AND BILATERAL STENT PLACEMENT;  Surgeon: Theodore Manny, MD;  Location: [facility];  Service: Urology;  Laterality: Bilateral;  ? CYSTOSCOPY WITH URETEROSCOPY AND STENT PLACEMENT Left 02/12/2021  ? Procedure: CYSTOSCOPY WITH LEFT URETEROSCOPY, BALLOON DILITATION, HOLMIUM LASER LITHOTRIPSY AND STENT PLACEMENT;  Surgeon: Bell, Eugene D III, MD;  Location: [facility];  Service: Urology;  Laterality: Left;  ? HOLMIUM LASER APPLICATION Left 03/19/2014  ? Procedure: HOLMIUM LASER APPLICATION;  Surgeon: Theodore Manny, MD;  Location: [facility];  Service: Urology;  Laterality: Left;  ? LITHOTRIPSY    ? NEPHROLITHOTOMY Left 03/17/2014  ? Procedure: NEPHROLITHOTOMY PERCUTANEOUS with access;  Surgeon: Theodore Manny, MD;  Location: [facility];  Service: Urology;  Laterality: Left;  ? NEPHROLITHOTOMY Left 03/19/2014  ? Procedure: 2ND STAGE NEPHROLITHOTOMY PERCUTANEOUS left ureteroscopy with lasr stone treatment and stent placement, with access;  Surgeon: Theodore Manny, MD;  Location: [facility];  Service: Urology;  Laterality: Left;  ? TOTAL KNEE ARTHROPLASTY Right 09/02/2014  ? Procedure: RIGHT TOTAL KNEE ARTHROPLASTY;  Surgeon: Gregory Scott Dean, MD;  Location: [facility];  Service: Orthopedics;  Laterality: Right;  ? uretral stent    ?  ? ?Medications Prior to Admission: ?Prior to Admission medications   ?Medication Sig Start Date End Date Taking? Authorizing Provider  ?amLODipine (NORVASC) 10 MG tablet Take 10 mg by mouth every morning.    [provider]  ?atorvastatin (LIPITOR) 20 MG tablet Take 20 mg by mouth every evening.    [provider]  ?cholecalciferol (VITAMIN D) 1000 UNITS tablet Take 1,000 Units by mouth daily.    [provider]  ?citalopram (CELEXA) 20 MG tablet Take 20 mg by mouth daily.    [provider]  ?Cyanocobalamin (VITAMIN B-12) 2500 MCG SUBL Place 1 tablet under the tongue daily.    [provider]  ?cyclobenzaprine  (FLEXERIL) 10 MG tablet TAKE 1 TABLET BY MOUTH TWICE DAILY ?Patient taking differently: Take 10 mg by mouth daily as needed for muscle spasms. 07/10/17   Dean, Gregory Scott, MD  ?diclofenac Sodium (VOLTAREN) 1 % GEL Apply 2 g topically 4 (four) times daily. ?Patient not taking: Reported on 02/11/2021 01/26/21   Rees, Elizabeth, MD  ?gabapentin (NEURONTIN) 300 MG capsule Take 300 mg by mouth 2 (two) times daily.    [provider]  ?hydrochlorothiazide (HYDRODIURIL) 25 MG tablet Take 12.5 mg by mouth daily. 10/20/20   [provider]  ?HYDROcodone-acetaminophen (NORCO) 5-325 MG tablet Take 1 tablet by mouth every 4 (four) hours as needed for moderate pain. 02/12/21   Bell, Eugene D III, MD  ?insulin glargine (LANTUS) 100 UNIT/ML injection Inject 30 Units into the skin at bedtime.     [provider]  ?lisinopril (PRINIVIL,ZESTRIL) 40 MG tablet Take 40 mg by mouth every morning.     [provider]  ?metFORMIN (GLUCOPHAGE) 500 MG tablet Take 1,000 mg by mouth 2 (two) times daily with a meal.     [provider]  ?  Multiple Vitamins-Minerals (MULTIVITAMINS THER. W/MINERALS) TABS Take 2 tablets by mouth every morning. men    [provider]  ?Omega-3 Fatty Acids (FISH OIL) 1200 MG CAPS Take 1,200 mg by mouth daily.    [provider]  ?oxyCODONE-acetaminophen (PERCOCET/ROXICET) 5-325 MG tablet Take 1 tablet by mouth every 6 (six) hours as needed for severe pain. ?Patient not taking: Reported on 02/11/2021 01/26/21   Quintella Reichert, MD  ?pioglitazone (ACTOS) 30 MG tablet Take 30 mg by mouth daily after lunch.    [provider]  ?  ? ?Allergies:   No Known Allergies ? ?Social History:   ?Social History  ? ?Socioeconomic History  ? Marital status: Married  ?  Spouse name: Not on file  ? Number of children: Not on file  ? Years of education: Not on file  ? Highest education level: Not on file  ?Occupational History  ? Not on file  ?Tobacco Use  ? Smoking  status: Never  ? Smokeless tobacco: Never  ?Vaping Use  ? Vaping Use: Never used  ?Substance and Sexual Activity  ? Alcohol use: Yes  ?  Alcohol/week: 2.0 standard drinks  ?  Types: 2 Cans of beer per week  ?  Comment: oc

## 2021-12-28 ENCOUNTER — Encounter (HOSPITAL_COMMUNITY)
Admission: EM | Disposition: A | Discharge status: Home / Self Care | Payer: Self-pay | Source: Home / Self Care | Attending: Internal Medicine

## 2021-12-28 ENCOUNTER — Other Ambulatory Visit: Payer: Self-pay

## 2021-12-28 ENCOUNTER — Observation Stay (HOSPITAL_BASED_OUTPATIENT_CLINIC_OR_DEPARTMENT_OTHER): Payer: Medicare Other

## 2021-12-28 ENCOUNTER — Encounter (HOSPITAL_COMMUNITY): Payer: Self-pay | Admitting: Internal Medicine

## 2021-12-28 DIAGNOSIS — I2510 Atherosclerotic heart disease of native coronary artery without angina pectoris: Secondary | ICD-10-CM | POA: Diagnosis not present

## 2021-12-28 DIAGNOSIS — I10 Essential (primary) hypertension: Secondary | ICD-10-CM | POA: Diagnosis not present

## 2021-12-28 DIAGNOSIS — E785 Hyperlipidemia, unspecified: Secondary | ICD-10-CM

## 2021-12-28 DIAGNOSIS — I214 Non-ST elevation (NSTEMI) myocardial infarction: Secondary | ICD-10-CM | POA: Diagnosis not present

## 2021-12-28 DIAGNOSIS — E119 Type 2 diabetes mellitus without complications: Secondary | ICD-10-CM

## 2021-12-28 HISTORY — PX: INTRAVASCULAR ULTRASOUND/IVUS: CATH118244

## 2021-12-28 HISTORY — PX: CORONARY STENT INTERVENTION: CATH118234

## 2021-12-28 HISTORY — PX: LEFT HEART CATH AND CORONARY ANGIOGRAPHY: CATH118249

## 2021-12-28 LAB — ECHOCARDIOGRAM COMPLETE
AR max vel: 1.21 cm2
AV Area VTI: 1.19 cm2
AV Area mean vel: 1.13 cm2
AV Mean grad: 16 mmHg
AV Peak grad: 25.2 mmHg
Ao pk vel: 2.51 m/s
Area-P 1/2: 3.91 cm2
Calc EF: 46.1 %
Height: 70 in
S' Lateral: 3.9 cm
Single Plane A2C EF: 44.6 %
Single Plane A4C EF: 46.2 %
Weight: 2880 oz

## 2021-12-28 LAB — CBC
HCT: 44.2 % (ref 39.0–52.0)
Hemoglobin: 14.4 g/dL (ref 13.0–17.0)
MCH: 30.8 pg (ref 26.0–34.0)
MCHC: 32.6 g/dL (ref 30.0–36.0)
MCV: 94.6 fL (ref 80.0–100.0)
Platelets: 310 10*3/uL (ref 150–400)
RBC: 4.67 MIL/uL (ref 4.22–5.81)
RDW: 13.2 % (ref 11.5–15.5)
WBC: 13 10*3/uL — ABNORMAL HIGH (ref 4.0–10.5)
nRBC: 0 % (ref 0.0–0.2)

## 2021-12-28 LAB — POCT ACTIVATED CLOTTING TIME: Activated Clotting Time: 317 seconds

## 2021-12-28 LAB — HEPARIN LEVEL (UNFRACTIONATED)
Heparin Unfractionated: <0.1 IU/mL — ABNORMAL LOW (ref 0.30–0.70)
Heparin Unfractionated: 0.24 IU/mL — ABNORMAL LOW (ref 0.30–0.70)

## 2021-12-28 LAB — CBG MONITORING, ED
Glucose-Capillary: 122 mg/dL — ABNORMAL HIGH (ref 70–99)
Glucose-Capillary: 166 mg/dL — ABNORMAL HIGH (ref 70–99)
Glucose-Capillary: 104 mg/dL — ABNORMAL HIGH (ref 70–99)

## 2021-12-28 LAB — COMPREHENSIVE METABOLIC PANEL
ALT: 337 U/L — ABNORMAL HIGH (ref 0–44)
AST: 152 U/L — ABNORMAL HIGH (ref 15–41)
Albumin: 3.8 g/dL (ref 3.5–5.0)
Alkaline Phosphatase: 54 U/L (ref 38–126)
Anion gap: 10 (ref 5–15)
BUN: 12 mg/dL (ref 8–23)
CO2: 30 mmol/L (ref 22–32)
Calcium: 9.1 mg/dL (ref 8.9–10.3)
Chloride: 102 mmol/L (ref 98–111)
Creatinine, Ser: 0.49 mg/dL — ABNORMAL LOW (ref 0.61–1.24)
GFR, Estimated: >60 mL/min (ref >60)
Glucose, Bld: 138 mg/dL — ABNORMAL HIGH (ref 70–99)
Potassium: 3.4 mmol/L — ABNORMAL LOW (ref 3.5–5.1)
Sodium: 142 mmol/L (ref 135–145)
Total Bilirubin: 1.1 mg/dL (ref 0.3–1.2)
Total Protein: 6.7 g/dL (ref 6.5–8.1)

## 2021-12-28 LAB — D-DIMER, QUANTITATIVE: D-Dimer, Quant: 0.32 ug/mL-FEU (ref 0.00–0.50)

## 2021-12-28 LAB — LIPID PANEL
Cholesterol: 272 mg/dL — ABNORMAL HIGH (ref 0–200)
HDL: 41 mg/dL (ref >40)
LDL Cholesterol: 191 mg/dL — ABNORMAL HIGH (ref 0–99)
Total CHOL/HDL Ratio: 6.6 RATIO
Triglycerides: 198 mg/dL — ABNORMAL HIGH (ref <150)
VLDL: 40 mg/dL (ref 0–40)

## 2021-12-28 LAB — HEMOGLOBIN A1C
Hgb A1c MFr Bld: 7.4 % — ABNORMAL HIGH (ref 4.8–5.6)
Mean Plasma Glucose: 166 mg/dL

## 2021-12-28 LAB — GLUCOSE, CAPILLARY: Glucose-Capillary: 225 mg/dL — ABNORMAL HIGH (ref 70–99)

## 2021-12-28 LAB — HIV ANTIBODY (ROUTINE TESTING W REFLEX): HIV Screen 4th Generation wRfx: NONREACTIVE

## 2021-12-28 SURGERY — LEFT HEART CATH AND CORONARY ANGIOGRAPHY
Anesthesia: LOCAL

## 2021-12-28 MED ORDER — SODIUM CHLORIDE 0.9% FLUSH: 3.0000 mL | INTRAVENOUS | Status: DC | PRN

## 2021-12-28 MED ORDER — CYCLOBENZAPRINE HCL 10 MG PO TABS
10.0000 mg | ORAL_TABLET | Freq: Two times a day (BID) | ORAL | Status: DC
  Administered 2021-12-28 – 2021-12-29 (×2): 10 mg via ORAL
  Filled 2021-12-28 (×2): qty 1

## 2021-12-28 MED ORDER — SODIUM CHLORIDE 0.9 % IV SOLN: 250.0000 mL | INTRAVENOUS | Status: DC | PRN

## 2021-12-28 MED ORDER — SODIUM CHLORIDE 0.9 % IV SOLN
250.0000 mL | INTRAVENOUS | Status: DC | PRN
Start: 2021-12-28 — End: 2021-12-29

## 2021-12-28 MED ORDER — SODIUM CHLORIDE 0.9% FLUSH
3.0000 mL | Freq: Two times a day (BID) | INTRAVENOUS | Status: DC
  Administered 2021-12-29: 3 mL via INTRAVENOUS

## 2021-12-28 MED ORDER — LIDOCAINE HCL (PF) 1 % IJ SOLN
INTRAMUSCULAR | Status: DC | PRN
  Administered 2021-12-28: 2 mL

## 2021-12-28 MED ORDER — FENTANYL CITRATE (PF) 100 MCG/2ML IJ SOLN
INTRAMUSCULAR | Status: DC | PRN
  Administered 2021-12-28 (×2): 25 ug via INTRAVENOUS

## 2021-12-28 MED ORDER — HYDRALAZINE HCL 20 MG/ML IJ SOLN: 10.0000 mg | INTRAMUSCULAR | Status: AC | PRN

## 2021-12-28 MED ORDER — SODIUM CHLORIDE 0.9% FLUSH
3.0000 mL | Freq: Two times a day (BID) | INTRAVENOUS | Status: DC
  Administered 2021-12-28: 3 mL via INTRAVENOUS

## 2021-12-28 MED ORDER — NITROGLYCERIN 1 MG/10 ML FOR IR/CATH LAB
INTRA_ARTERIAL | Status: DC | PRN
  Administered 2021-12-28: 200 ug via INTRACORONARY

## 2021-12-28 MED ORDER — HEPARIN BOLUS VIA INFUSION
1200.0000 [IU] | Freq: Once | INTRAVENOUS | Status: DC
Start: 2021-12-28 — End: 2021-12-28
  Filled 2021-12-28: qty 1200

## 2021-12-28 MED ORDER — SODIUM CHLORIDE 0.9 % WEIGHT BASED INFUSION
1.0000 mL/kg/h | INTRAVENOUS | Status: DC
  Administered 2021-12-28: 1 mL/kg/h via INTRAVENOUS

## 2021-12-28 MED ORDER — HEPARIN BOLUS VIA INFUSION
2000.0000 [IU] | Freq: Once | INTRAVENOUS | Status: AC
  Administered 2021-12-28: 2000 [IU] via INTRAVENOUS
  Filled 2021-12-28: qty 2000

## 2021-12-28 MED ORDER — HEPARIN (PORCINE) IN NACL 1000-0.9 UT/500ML-% IV SOLN
INTRAVENOUS | Status: DC | PRN
  Administered 2021-12-28 (×2): 500 mL

## 2021-12-28 MED ORDER — SODIUM CHLORIDE 0.9 % WEIGHT BASED INFUSION: 3.0000 mL/kg/h | INTRAVENOUS | Status: DC

## 2021-12-28 MED ORDER — NITROGLYCERIN 1 MG/10 ML FOR IR/CATH LAB
INTRA_ARTERIAL | Status: AC
  Filled 2021-12-28: qty 10

## 2021-12-28 MED ORDER — TICAGRELOR 90 MG PO TABS
ORAL_TABLET | ORAL | Status: AC
  Filled 2021-12-28: qty 2

## 2021-12-28 MED ORDER — SODIUM CHLORIDE 0.9 % WEIGHT BASED INFUSION
3.0000 mL/kg/h | INTRAVENOUS | Status: DC
  Administered 2021-12-28: 3 mL/kg/h via INTRAVENOUS

## 2021-12-28 MED ORDER — MELATONIN 5 MG PO TABS
5.0000 mg | ORAL_TABLET | Freq: Every day | ORAL | Status: DC
  Administered 2021-12-28: 5 mg via ORAL
  Filled 2021-12-28: qty 1

## 2021-12-28 MED ORDER — ACETAMINOPHEN 325 MG PO TABS: 650.0000 mg | ORAL_TABLET | ORAL | Status: DC | PRN

## 2021-12-28 MED ORDER — ONDANSETRON HCL 4 MG/2ML IJ SOLN
4.0000 mg | Freq: Four times a day (QID) | INTRAMUSCULAR | Status: DC | PRN
Start: 2021-12-28 — End: 2021-12-29

## 2021-12-28 MED ORDER — HEPARIN SODIUM (PORCINE) 1000 UNIT/ML IJ SOLN
INTRAMUSCULAR | Status: AC
  Filled 2021-12-28: qty 10

## 2021-12-28 MED ORDER — HEPARIN SODIUM (PORCINE) 1000 UNIT/ML IJ SOLN
INTRAMUSCULAR | Status: DC | PRN
  Administered 2021-12-28: 6000 [IU] via INTRAVENOUS

## 2021-12-28 MED ORDER — LIDOCAINE HCL (PF) 1 % IJ SOLN
INTRAMUSCULAR | Status: AC
  Filled 2021-12-28: qty 30

## 2021-12-28 MED ORDER — MIDAZOLAM HCL 2 MG/2ML IJ SOLN
INTRAMUSCULAR | Status: AC
  Filled 2021-12-28: qty 2

## 2021-12-28 MED ORDER — SODIUM CHLORIDE 0.9 % WEIGHT BASED INFUSION: 1.0000 mL/kg/h | INTRAVENOUS | Status: DC

## 2021-12-28 MED ORDER — HEPARIN (PORCINE) 25000 UT/250ML-% IV SOLN
1350.0000 [IU]/h | INTRAVENOUS | Status: DC
  Administered 2021-12-28 (×2): 1200 [IU]/h via INTRAVENOUS
  Filled 2021-12-28: qty 250

## 2021-12-28 MED ORDER — SODIUM CHLORIDE 0.9 % IV SOLN: INTRAVENOUS | Status: AC

## 2021-12-28 MED ORDER — TICAGRELOR 90 MG PO TABS
90.0000 mg | ORAL_TABLET | Freq: Two times a day (BID) | ORAL | Status: DC
  Administered 2021-12-28 – 2021-12-29 (×2): 90 mg via ORAL
  Filled 2021-12-28 (×2): qty 1

## 2021-12-28 MED ORDER — TICAGRELOR 90 MG PO TABS
ORAL_TABLET | ORAL | Status: DC | PRN
  Administered 2021-12-28: 180 mg via ORAL

## 2021-12-28 MED ORDER — POTASSIUM CHLORIDE CRYS ER 20 MEQ PO TBCR
30.0000 meq | EXTENDED_RELEASE_TABLET | Freq: Once | ORAL | Status: AC
  Administered 2021-12-28: 30 meq via ORAL
  Filled 2021-12-28: qty 1

## 2021-12-28 MED ORDER — ASPIRIN 81 MG PO CHEW: 81.0000 mg | CHEWABLE_TABLET | Freq: Every day | ORAL | Status: DC

## 2021-12-28 MED ORDER — MIDAZOLAM HCL 2 MG/2ML IJ SOLN
INTRAMUSCULAR | Status: DC | PRN
  Administered 2021-12-28: 2 mg via INTRAVENOUS
  Administered 2021-12-28: 1 mg via INTRAVENOUS

## 2021-12-28 MED ORDER — VERAPAMIL HCL 2.5 MG/ML IV SOLN
INTRAVENOUS | Status: AC
  Filled 2021-12-28: qty 2

## 2021-12-28 MED ORDER — FENTANYL CITRATE (PF) 100 MCG/2ML IJ SOLN
INTRAMUSCULAR | Status: AC
  Filled 2021-12-28: qty 2

## 2021-12-28 MED ORDER — VERAPAMIL HCL 2.5 MG/ML IV SOLN
INTRAVENOUS | Status: DC | PRN
  Administered 2021-12-28: 10 mL via INTRA_ARTERIAL

## 2021-12-28 MED ORDER — HEPARIN (PORCINE) IN NACL 1000-0.9 UT/500ML-% IV SOLN
INTRAVENOUS | Status: AC
  Filled 2021-12-28: qty 1000

## 2021-12-28 MED ORDER — IOHEXOL 350 MG/ML SOLN
INTRAVENOUS | Status: DC | PRN
  Administered 2021-12-28: 110 mL

## 2021-12-28 MED ORDER — LABETALOL HCL 5 MG/ML IV SOLN: 10.0000 mg | INTRAVENOUS | Status: AC | PRN

## 2021-12-28 SURGICAL SUPPLY — 22 items
BALLN SCOREFLEX 3.50X15 (BALLOONS) ×2
BALLN ~~LOC~~ EUPHORA RX 4.5X8 (BALLOONS) ×2
BALLOON SCOREFLEX 3.50X15 (BALLOONS) IMPLANT
BALLOON ~~LOC~~ EUPHORA RX 4.5X8 (BALLOONS) IMPLANT
BAND CMPR LRG ZPHR (HEMOSTASIS) ×1
BAND ZEPHYR COMPRESS 30 LONG (HEMOSTASIS) ×1 IMPLANT
CATH 5FR JL3.5 JR4 ANG PIG MP (CATHETERS) ×1 IMPLANT
CATH LAUNCHER 6FR JR4 (CATHETERS) ×1 IMPLANT
CATH OPTICROSS HD (CATHETERS) ×1 IMPLANT
GLIDESHEATH SLEND SS 6F .021 (SHEATH) ×1 IMPLANT
GUIDEWIRE INQWIRE 1.5J.035X260 (WIRE) IMPLANT
INQWIRE 1.5J .035X260CM (WIRE) ×2
KIT ENCORE 26 ADVANTAGE (KITS) ×1 IMPLANT
KIT HEART LEFT (KITS) ×2 IMPLANT
KIT HEMO VALVE WATCHDOG (MISCELLANEOUS) ×1 IMPLANT
PACK CARDIAC CATHETERIZATION (CUSTOM PROCEDURE TRAY) ×2 IMPLANT
SHEATH PROBE COVER 6X72 (BAG) ×1 IMPLANT
SLED PULL BACK IVUS (MISCELLANEOUS) ×1 IMPLANT
STENT ONYX FRONTIER 4.0X18 (Permanent Stent) ×1 IMPLANT
TRANSDUCER W/STOPCOCK (MISCELLANEOUS) ×2 IMPLANT
TUBING CIL FLEX 10 FLL-RA (TUBING) ×2 IMPLANT
WIRE ASAHI PROWATER 180CM (WIRE) ×1 IMPLANT

## 2021-12-28 NOTE — H&P (View-Only) (Signed)
? ?Progress Note ? ?Patient Name: [name] ?Date of Encounter: 12/28/2021 ? ?Primary Cardiologist: New to [facility] ? ?Subjective  ? ?No chest pain since nitro drip.  Has not slept well in the ED.  No SOB.  Eager to proceed with LHC. ?Troponon 77-> 88-> no further lab draws. ? ?Inpatient Medications  ?  ?Scheduled Meds: ? amLODipine  10 mg Oral q morning  ? aspirin EC  81 mg Oral Daily  ? carvedilol  6.25 mg Oral BID WC  ? citalopram  20 mg Oral Daily  ? insulin aspart  0-15 Units Subcutaneous TID WC  ? insulin glargine-yfgn  15 Units Subcutaneous QHS  ? ?Continuous Infusions: ? sodium chloride 1 mL/kg/hr (12/28/21 0843)  ? heparin 1,200 Units/hr (12/28/21 0503)  ? nitroGLYCERIN 20 mcg/min (12/28/21 0338)  ? ?PRN Meds: ?nitroGLYCERIN, ondansetron (ZOFRAN) IV  ? ?Vital Signs  ?  ?Vitals:  ? 12/28/21 0845 12/28/21 0915 12/28/21 0930 12/28/21 1015  ?BP: (!) 149/85 118/78 117/73 113/77  ?Pulse: 94 89 88 86  ?Resp: 20 20 (!) 25 (!) 27  ?Temp:      ?TempSrc:      ?SpO2: 95% 95% 94% 96%  ?Weight:      ?Height:      ? ? ?Intake/Output Summary (Last 24 hours) at 12/28/2021 1044 ?Last data filed at 12/28/2021 [phone] ?Gross per 24 hour  ?Intake 245 ml  ?Output --  ?Net 245 ml  ? ?Filed Weights  ? 12/27/21 1757  ?Weight: 81.6 kg  ? ? ?Telemetry  ?  ?SR - Personally Reviewed ? ?Physical Exam  ? ?Gen: no distress   ?Neck: No JVD ?Cardiac: No Rubs or Gallops, no Murmur, RRR, +2 radial pulses ?Respiratory: Clear to auscultation bilaterally, normal effort, normal  respiratory rate ?GI: Soft, nontender, non-distended  ?MS: No  edema;  moves all extremities ?Integument: Skin feels well ?Neuro:  At time of evaluation, alert and oriented to person/place/time/situation  ?Psych: Normal affect, patient feels better ? ? ?Labs  ?  ?Chemistry ?Recent Labs  ?Lab 12/27/21 ?1823 12/28/21 ?0259  ?NA 141 142  ?K 3.7 3.4*  ?CL 105 102  ?CO2 26 30  ?GLUCOSE 176* 138*  ?BUN 14 12  ?CREATININE 0.50* 0.49*  ?CALCIUM 8.9 9.1  ?PROT 6.7 6.7   ?ALBUMIN 3.7 3.8  ?AST 160* 152*  ?ALT 341* 337*  ?ALKPHOS 54 54  ?BILITOT 0.6 1.1  ?GFRNONAA >60 >60  ?ANIONGAP 10 10  ?  ? ?Hematology ?Recent Labs  ?Lab 12/27/21 ?1823 12/28/21 ?0259  ?WBC 10.9* 13.0*  ?RBC 4.51 4.67  ?HGB 14.1 14.4  ?HCT 42.4 44.2  ?MCV 94.0 94.6  ?MCH 31.3 30.8  ?MCHC 33.3 32.6  ?RDW 13.3 13.2  ?PLT 288 310  ? ? ?Cardiac EnzymesNo results for input(s): TROPONINI in the last 168 hours. No results for input(s): TROPIPOC in the last 168 hours.  ? ?BNP ?Recent Labs  ?Lab 12/27/21 ?0737  ?BNP 129.0*  ?  ? ?DDimer  ?Recent Labs  ?Lab 12/28/21 ?1062  ?DDIMER 0.32  ?  ? ?Radiology  ?  ?DG Chest Portable 1 View ? ?Result Date: 12/27/2021 ?CLINICAL DATA:  Chest pressure EXAM: PORTABLE CHEST 1 VIEW COMPARISON:  Chest x-ray dated April 16, 2014 FINDINGS: Cardiac and mediastinal contours within normal limits. Mild bibasilar opacities, likely due to atelectasis. No large effusion evidence of pneumothorax. IMPRESSION: No active disease. Electronically Signed   By: Yetta Glassman M.D.   On: 12/27/2021 18:42   ? ?Cardiac  Studies  ? ?Echo is performed ?Grossly, LV function is low normal, in some views the mid inferoseptal segment appears hypokinesis.  No significant MR or TR.  Full study pending. ? ?Patient Profile  ?   ?66 y.o. male HTN, HLD with DM and recent transaminitis who presents with chest pain and NSTEMI ? ?Assessment & Plan  ?  ?NSTEMI ?HTN with DM ?HLD with DM ?Transaminitis NOS- ?- LVH by EKG Hypertensive urgency may play a roll in his SOB, no murmur today with BP better controlled ?- continue norvasx, asa, coreg, and nitro drip; may restart lisinopril post cath ?- received one dose of IV lasix at 2055 yesterday; will wait till LVEDP, but may give additional dose ?- high dose statin deferred because of labs may add zetia 10 mg if found to nave CAD ?- pending LHC today ? ?Labs ordered ?NPO until procedure then Cardiac diet ?Full Code ? ?For questions or updates, please contact [place] ?Please  consult [email or web address] for contact info under Cardiology/STEMI. ?  ?   ?Signed, ?Werner Lean, MD  ?12/28/2021, 10:44 AM   ? ?

## 2021-12-28 NOTE — Progress Notes (Signed)
?  Echocardiogram ?2D Echocardiogram has been performed. ? ?[name] ?12/28/2021, 10:38 AM ?

## 2021-12-28 NOTE — Progress Notes (Signed)
ANTICOAGULATION CONSULT NOTE - Initial Consult ? ?Pharmacy Consult for IV heparin ?Indication: chest pain/ACS ? ?No Known Allergies ? ?Patient Measurements: ?Height: '5\' 10"'$  (177.8 cm) ?Weight: 81.6 kg (180 lb) ?IBW/kg (Calculated) : 73 ?Heparin Dosing Weight: 81.6 kg ? ?Vital Signs: ?Temp: 99 ?F (37.2 ?C) (04/03 1753) ?Temp Source: Oral (04/03 1755) ?BP: 133/85 (04/04 0400) ?Pulse Rate: 72 (04/04 0400) ? ?Labs: ?Recent Labs  ?  12/27/21 ?1823 12/27/21 ?1945 12/28/21 ?0259  ?HGB 14.1  --  14.4  ?HCT 42.4  --  44.2  ?PLT 288  --  310  ?LABPROT 13.1  --   --   ?INR 1.0  --   --   ?HEPARINUNFRC  --   --  <0.10*  ?CREATININE 0.50*  --  0.49*  ?TROPONINIHS 77* 88*  --   ? ? ? ?Estimated Creatinine Clearance: 96.3 mL/min (A) (by C-G formula based on SCr of 0.49 mg/dL (L)). ? ? ?Medical History: ?Past Medical History:  ?Diagnosis Date  ? Arthritis   ? Diabetes mellitus   ? fasting 120s  ? History of kidney stones   ? Hypertension   ? Peripheral neuropathy   ? left fifth finger, left toe  ? Pneumonia   ? hx of  ? ?Assessment: ?66 yo male presenting with chest pain and 4-6 month history of intermittent shortness of breath and weakness in his legs. Pharmacy is consulted to start heparin.  ? ? Heparin level  is <0.1, undetectable  ~ 7 hr post start of Heparin drip  950 ut/hr and a 4000 unit bolus.   No issues with infusion and no bleeding per RN report.  Hemoglobin 14.4 and platelets 310, both within normal limits and stable.  ? ?Goal of Therapy:  ?Heparin level 0.3-0.7 units/ml ?Monitor platelets by anticoagulation protocol: Yes ?  ?Plan:  ?Give Heparin 2000 unit bolus x1  and increase heparin drip rate to 1200 units/hr.  ?Check 8hr heparin level after bolus and rate increased. ?Monitor for s/sx of bleeding.  ?Possible cardiac cath today.  Will follow up. ? ?Thank you for including pharmacy in the care of this patient. ? ?Nicole Cella, RPh ?Clinical Pharmacist ?ED Pharmacist : [phone] or Please check AMION for all [facility]  phone numbers ?After 10:00 PM, call [place] [phone] ? ?12/28/2021  4:41 AM ? ?Please check [email or web address] for unit-specific pharmacy phone numbers. ? ? ? ?

## 2021-12-28 NOTE — Progress Notes (Signed)
ANTICOAGULATION CONSULT NOTE - Initial Consult ? ?Pharmacy Consult for IV heparin ?Indication: chest pain/ACS ? ?No Known Allergies ? ?Patient Measurements: ?Height: '5\' 10"'$  (177.8 cm) ?Weight: 81.6 kg (180 lb) ?IBW/kg (Calculated) : 73 ?Heparin Dosing Weight: 81.6 kg ? ?Vital Signs: ?Temp: 98 ?F (36.7 ?C) (04/04 0734) ?Temp Source: Oral (04/04 0734) ?BP: 132/78 (04/04 1500) ?Pulse Rate: 86 (04/04 1500) ? ?Labs: ?Recent Labs  ?  12/27/21 ?1823 12/27/21 ?1945 12/28/21 ?0259 12/28/21 ?1506  ?HGB 14.1  --  14.4  --   ?HCT 42.4  --  44.2  --   ?PLT 288  --  310  --   ?LABPROT 13.1  --   --   --   ?INR 1.0  --   --   --   ?HEPARINUNFRC  --   --  <0.10* 0.24*  ?CREATININE 0.50*  --  0.49*  --   ?TROPONINIHS 77* 88*  --   --   ? ? ? ?Estimated Creatinine Clearance: 96.3 mL/min (A) (by C-G formula based on SCr of 0.49 mg/dL (L)). ? ? ?Medical History: ?Past Medical History:  ?Diagnosis Date  ? Arthritis   ? Diabetes mellitus   ? fasting 120s  ? History of kidney stones   ? Hypertension   ? Peripheral neuropathy   ? left fifth finger, left toe  ? Pneumonia   ? hx of  ? ?Assessment: ?66 yo male presenting with chest pain and 4-6 month history of intermittent shortness of breath and weakness in his legs. Pharmacy consulted for heparin.  ? ?Heparin level subtherapeutic at 0.24 following heparin 2000 unit bolus and heparin 1200 units/hr. No issues with infusion and no bleeding noted per chart review. Hemoglobin 14.4 and platelets 310, both within normal limits and stable.  ? ?Goal of Therapy:  ?Heparin level 0.3-0.7 units/ml ?Monitor platelets by anticoagulation protocol: Yes ?  ?Plan:  ?Give Heparin 1200 unit bolus x1 and increase heparin drip rate to 1350 units/hr.  ?Check 8hr heparin level after bolus and rate increase ?Monitor for s/sx of bleeding.  ?Possible cardiac cath today. Will follow up. ? ?Thank you for including pharmacy in the care of this patient. ? ?Zenaida Deed, PharmD ?PGY1 Acute Care Pharmacy Resident   ?Phone: [phone] ?12/28/2021  4:04 PM ? ?Please check [email or web address] for unit-specific pharmacy phone numbers. ?

## 2021-12-28 NOTE — Interval H&P Note (Signed)
Cath Lab Visit (complete for each Cath Lab visit) ? ?Clinical Evaluation Leading to the Procedure:  ? ?ACS: Yes.   ? ?Non-ACS:   ? ?Anginal Classification: CCS IV ? ?Anti-ischemic medical therapy: Minimal Therapy (1 class of medications) ? ?Non-Invasive Test Results: No non-invasive testing performed ? ?Prior CABG: No previous CABG ? ? ? ? ? ?History and Physical Interval Note: ? ?12/28/2021 ?4:50 PM ? ?[name]  has presented today for surgery, with the diagnosis of unstable angina.  The various methods of treatment have been discussed with the patient and family. After consideration of risks, benefits and other options for treatment, the patient has consented to  Procedure(s): ?LEFT HEART CATH AND CORONARY ANGIOGRAPHY (N/A) as a surgical intervention.  The patient's history has been reviewed, patient examined, no change in status, stable for surgery.  I have reviewed the patient's chart and labs.  Questions were answered to the patient's satisfaction.   ? ? ?Larae Grooms ? ? ?

## 2021-12-28 NOTE — Plan of Care (Signed)

## 2021-12-28 NOTE — Progress Notes (Signed)
? ?Progress Note ? ?Patient Name: [name] ?Date of Encounter: 12/28/2021 ? ?Primary Cardiologist: New to [facility] ? ?Subjective  ? ?No chest pain since nitro drip.  Has not slept well in the ED.  No SOB.  Eager to proceed with LHC. ?Troponon 77-> 88-> no further lab draws. ? ?Inpatient Medications  ?  ?Scheduled Meds: ? amLODipine  10 mg Oral q morning  ? aspirin EC  81 mg Oral Daily  ? carvedilol  6.25 mg Oral BID WC  ? citalopram  20 mg Oral Daily  ? insulin aspart  0-15 Units Subcutaneous TID WC  ? insulin glargine-yfgn  15 Units Subcutaneous QHS  ? ?Continuous Infusions: ? sodium chloride 1 mL/kg/hr (12/28/21 0843)  ? heparin 1,200 Units/hr (12/28/21 0503)  ? nitroGLYCERIN 20 mcg/min (12/28/21 0338)  ? ?PRN Meds: ?nitroGLYCERIN, ondansetron (ZOFRAN) IV  ? ?Vital Signs  ?  ?Vitals:  ? 12/28/21 0845 12/28/21 0915 12/28/21 0930 12/28/21 1015  ?BP: (!) 149/85 118/78 117/73 113/77  ?Pulse: 94 89 88 86  ?Resp: 20 20 (!) 25 (!) 27  ?Temp:      ?TempSrc:      ?SpO2: 95% 95% 94% 96%  ?Weight:      ?Height:      ? ? ?Intake/Output Summary (Last 24 hours) at 12/28/2021 1044 ?Last data filed at 12/28/2021 [phone] ?Gross per 24 hour  ?Intake 245 ml  ?Output --  ?Net 245 ml  ? ?Filed Weights  ? 12/27/21 1757  ?Weight: 81.6 kg  ? ? ?Telemetry  ?  ?SR - Personally Reviewed ? ?Physical Exam  ? ?Gen: no distress   ?Neck: No JVD ?Cardiac: No Rubs or Gallops, no Murmur, RRR, +2 radial pulses ?Respiratory: Clear to auscultation bilaterally, normal effort, normal  respiratory rate ?GI: Soft, nontender, non-distended  ?MS: No  edema;  moves all extremities ?Integument: Skin feels well ?Neuro:  At time of evaluation, alert and oriented to person/place/time/situation  ?Psych: Normal affect, patient feels better ? ? ?Labs  ?  ?Chemistry ?Recent Labs  ?Lab 12/27/21 ?1823 12/28/21 ?0259  ?NA 141 142  ?K 3.7 3.4*  ?CL 105 102  ?CO2 26 30  ?GLUCOSE 176* 138*  ?BUN 14 12  ?CREATININE 0.50* 0.49*  ?CALCIUM 8.9 9.1  ?PROT 6.7 6.7   ?ALBUMIN 3.7 3.8  ?AST 160* 152*  ?ALT 341* 337*  ?ALKPHOS 54 54  ?BILITOT 0.6 1.1  ?GFRNONAA >60 >60  ?ANIONGAP 10 10  ?  ? ?Hematology ?Recent Labs  ?Lab 12/27/21 ?1823 12/28/21 ?0259  ?WBC 10.9* 13.0*  ?RBC 4.51 4.67  ?HGB 14.1 14.4  ?HCT 42.4 44.2  ?MCV 94.0 94.6  ?MCH 31.3 30.8  ?MCHC 33.3 32.6  ?RDW 13.3 13.2  ?PLT 288 310  ? ? ?Cardiac EnzymesNo results for input(s): TROPONINI in the last 168 hours. No results for input(s): TROPIPOC in the last 168 hours.  ? ?BNP ?Recent Labs  ?Lab 12/27/21 ?2703  ?BNP 129.0*  ?  ? ?DDimer  ?Recent Labs  ?Lab 12/28/21 ?5009  ?DDIMER 0.32  ?  ? ?Radiology  ?  ?DG Chest Portable 1 View ? ?Result Date: 12/27/2021 ?CLINICAL DATA:  Chest pressure EXAM: PORTABLE CHEST 1 VIEW COMPARISON:  Chest x-ray dated April 16, 2014 FINDINGS: Cardiac and mediastinal contours within normal limits. Mild bibasilar opacities, likely due to atelectasis. No large effusion evidence of pneumothorax. IMPRESSION: No active disease. Electronically Signed   By: Yetta Glassman M.D.   On: 12/27/2021 18:42   ? ?Cardiac  Studies  ? ?Echo is performed ?Grossly, LV function is low normal, in some views the mid inferoseptal segment appears hypokinesis.  No significant MR or TR.  Full study pending. ? ?Patient Profile  ?   ?66 y.o. male HTN, HLD with DM and recent transaminitis who presents with chest pain and NSTEMI ? ?Assessment & Plan  ?  ?NSTEMI ?HTN with DM ?HLD with DM ?Transaminitis NOS- ?- LVH by EKG Hypertensive urgency may play a roll in his SOB, no murmur today with BP better controlled ?- continue norvasx, asa, coreg, and nitro drip; may restart lisinopril post cath ?- received one dose of IV lasix at 2055 yesterday; will wait till LVEDP, but may give additional dose ?- high dose statin deferred because of labs may add zetia 10 mg if found to nave CAD ?- pending LHC today ? ?Labs ordered ?NPO until procedure then Cardiac diet ?Full Code ? ?For questions or updates, please contact [place] ?Please  consult [email or web address] for contact info under Cardiology/STEMI. ?  ?   ?Signed, ?Werner Lean, MD  ?12/28/2021, 10:44 AM   ? ?

## 2021-12-29 ENCOUNTER — Other Ambulatory Visit (HOSPITAL_COMMUNITY): Payer: Self-pay

## 2021-12-29 ENCOUNTER — Encounter (HOSPITAL_COMMUNITY): Payer: Self-pay | Admitting: Interventional Cardiology

## 2021-12-29 DIAGNOSIS — I214 Non-ST elevation (NSTEMI) myocardial infarction: Secondary | ICD-10-CM | POA: Diagnosis present

## 2021-12-29 DIAGNOSIS — E1140 Type 2 diabetes mellitus with diabetic neuropathy, unspecified: Secondary | ICD-10-CM | POA: Diagnosis present

## 2021-12-29 DIAGNOSIS — Z7984 Long term (current) use of oral hypoglycemic drugs: Secondary | ICD-10-CM | POA: Diagnosis not present

## 2021-12-29 DIAGNOSIS — F419 Anxiety disorder, unspecified: Secondary | ICD-10-CM | POA: Diagnosis present

## 2021-12-29 DIAGNOSIS — E7800 Pure hypercholesterolemia, unspecified: Secondary | ICD-10-CM | POA: Diagnosis present

## 2021-12-29 DIAGNOSIS — Z20822 Contact with and (suspected) exposure to covid-19: Secondary | ICD-10-CM | POA: Diagnosis present

## 2021-12-29 DIAGNOSIS — Z820 Family history of epilepsy and other diseases of the nervous system: Secondary | ICD-10-CM | POA: Diagnosis not present

## 2021-12-29 DIAGNOSIS — I10 Essential (primary) hypertension: Secondary | ICD-10-CM | POA: Diagnosis present

## 2021-12-29 DIAGNOSIS — Z794 Long term (current) use of insulin: Secondary | ICD-10-CM | POA: Diagnosis not present

## 2021-12-29 DIAGNOSIS — Z79899 Other long term (current) drug therapy: Secondary | ICD-10-CM | POA: Diagnosis not present

## 2021-12-29 DIAGNOSIS — I25110 Atherosclerotic heart disease of native coronary artery with unstable angina pectoris: Secondary | ICD-10-CM | POA: Diagnosis present

## 2021-12-29 DIAGNOSIS — I160 Hypertensive urgency: Secondary | ICD-10-CM | POA: Diagnosis present

## 2021-12-29 DIAGNOSIS — C61 Malignant neoplasm of prostate: Secondary | ICD-10-CM | POA: Diagnosis present

## 2021-12-29 DIAGNOSIS — Z8249 Family history of ischemic heart disease and other diseases of the circulatory system: Secondary | ICD-10-CM | POA: Diagnosis not present

## 2021-12-29 LAB — CBC
HCT: 41.9 % (ref 39.0–52.0)
Hemoglobin: 13.8 g/dL (ref 13.0–17.0)
MCH: 31.4 pg (ref 26.0–34.0)
MCHC: 32.9 g/dL (ref 30.0–36.0)
MCV: 95.2 fL (ref 80.0–100.0)
Platelets: 323 10*3/uL (ref 150–400)
RBC: 4.4 MIL/uL (ref 4.22–5.81)
RDW: 13.3 % (ref 11.5–15.5)
WBC: 13.2 10*3/uL — ABNORMAL HIGH (ref 4.0–10.5)
nRBC: 0 % (ref 0.0–0.2)

## 2021-12-29 LAB — BASIC METABOLIC PANEL
Anion gap: 6 (ref 5–15)
BUN: 13 mg/dL (ref 8–23)
CO2: 29 mmol/L (ref 22–32)
Calcium: 9.1 mg/dL (ref 8.9–10.3)
Chloride: 104 mmol/L (ref 98–111)
Creatinine, Ser: 0.49 mg/dL — ABNORMAL LOW (ref 0.61–1.24)
GFR, Estimated: >60 mL/min (ref >60)
Glucose, Bld: 66 mg/dL — ABNORMAL LOW (ref 70–99)
Potassium: 3.9 mmol/L (ref 3.5–5.1)
Sodium: 139 mmol/L (ref 135–145)

## 2021-12-29 LAB — GLUCOSE, CAPILLARY
Glucose-Capillary: 94 mg/dL (ref 70–99)
Glucose-Capillary: 124 mg/dL — ABNORMAL HIGH (ref 70–99)

## 2021-12-29 MED ORDER — LISINOPRIL 20 MG PO TABS
40.0000 mg | ORAL_TABLET | Freq: Every day | ORAL | Status: DC
  Administered 2021-12-29: 40 mg via ORAL
  Filled 2021-12-29: qty 2

## 2021-12-29 MED ORDER — NITROGLYCERIN 0.4 MG SL SUBL
0.4000 mg | SUBLINGUAL_TABLET | SUBLINGUAL | 12 refills | Status: DC | PRN
Start: 2021-12-29 — End: 2022-02-16
  Filled 2021-12-29: qty 25, 7d supply, fill #0

## 2021-12-29 MED ORDER — TICAGRELOR 90 MG PO TABS
90.0000 mg | ORAL_TABLET | Freq: Two times a day (BID) | ORAL | 3 refills | Status: DC
  Filled 2021-12-29: qty 60, 30d supply, fill #0

## 2021-12-29 MED ORDER — CARVEDILOL 12.5 MG PO TABS
12.5000 mg | ORAL_TABLET | Freq: Two times a day (BID) | ORAL | Status: DC
  Administered 2021-12-29: 12.5 mg via ORAL
  Filled 2021-12-29: qty 1

## 2021-12-29 MED ORDER — CARVEDILOL 12.5 MG PO TABS
12.5000 mg | ORAL_TABLET | Freq: Two times a day (BID) | ORAL | 11 refills | Status: DC
  Filled 2021-12-29: qty 60, 30d supply, fill #0

## 2021-12-29 MED ORDER — ASPIRIN 81 MG PO TBEC
81.0000 mg | DELAYED_RELEASE_TABLET | Freq: Every day | ORAL | 3 refills | Status: AC
  Filled 2021-12-29: qty 90, 90d supply, fill #0

## 2021-12-29 NOTE — Discharge Summary (Addendum)
?Discharge Summary  ?  ?Patient ID: [name] ?MRN: [id number]; DOB: 10-23-1955 ? ?Admit date: 12/27/2021 ?Discharge date: 12/29/2021 ? ?PCP:  Greig Right, MD ?  ?[facility] Providers ?Cardiologist:  Werner Lean, MD   { ? ?Discharge Diagnoses  ?  ?Principal Problem: ?  NSTEMI (non-ST elevated myocardial infarction) ([place]) ?  CAD ?  HTN ?  HLD ?  DM ?  Elevated LFTs ? ?Diagnostic Studies/Procedures  ?  ?CORONARY STENT INTERVENTION  ?Intravascular Ultrasound/IVUS  ?LEFT HEART CATH AND CORONARY ANGIOGRAPHY  ? ?Conclusion ? ?  ?  Ost RCA lesion is 80% stenosed. ?  A drug-eluting stent was successfully placed using a STENT ONYX FRONTIER 4.0X18, postdilated to 4.5 mm in diameter and optimized with intravascular ultrasound. ?  Post intervention, there is a 0% residual stenosis. ?  The left ventricular systolic function is normal. ?  LV end diastolic pressure is normal. ?  The left ventricular ejection fraction is 50-55% by visual estimate. ?  There is no aortic valve stenosis. ?  ?Successful PCI of the ostial RCA which was calcified.  It was a short focal area of calcification.  The distal reference was over 4 mm.  Proximal and ostial stent postdilated aggressively with a 4.5 [place]orton balloon.  I stressed the importance of dual antiplatelet therapy and aggressive secondary prevention. ? ?Diagnostic ?Dominance: Right ?Intervention ? ? ?Echo 12/28/2021 ?1. Left ventricular ejection fraction, by estimation, is 45 to 50%. The  ?left ventricle has mildly decreased function. The left ventricle  ?demonstrates global hypokinesis. Left ventricular diastolic parameters are  ?consistent with Grade I diastolic  ?dysfunction (impaired relaxation).  ? 2. Right ventricular systolic function is normal. The right ventricular  ?size is normal.  ? 3. Left atrial size was moderately dilated.  ? 4. The mitral valve is normal in structure. No evidence of mitral valve  ?regurgitation. No evidence of mitral stenosis.  ? 5. The aortic  valve is tricuspid. There is moderate calcification of the  ?aortic valve. There is mild thickening of the aortic valve. Aortic valve  ?regurgitation is not visualized. Mild to moderate aortic valve stenosis. ?  ?History of Present Illness   ?  ?[name] is a 66 y.o. male with  a history of hypertension, dyslipidemia, type 2 diabetes, nephrolithiasis and stage I prostate cancer and recent transaminitis admitted for chest pain evaluation.  ? ?No prior cardiac history. He denies any real smoking history (smoked a few cigarettes as a teenager but nothing since then). He does have a family history of CAD with his father having possible heart attack. ? ?Presented for chest pain and sob.  He was push-mowing his lawn when all of a sudden he developed significant leg weakness followed by significant shortness of breath and chest pressure that he described as a "couple hundred pounds" sitting on his chest. He sat down to try to catch his breath but pain persisted for 20 to 30 minutes so he called 911. He reports some intermittent dizziness over the last 6-9  months with some balance issues and falls. His Gabapentin was stopped because of this. He thinks anxiety may be contributing to that.  ? ?He also report progressive weakness over the last 4-6 months and states sometimes he gets so weak his has trouble brushing his hair. In addition, he was recently found to have elevated LFTs at his PCP's office. His PCP was initially concerned this could be due to his alcohol use (reports drinking a few  beers throughout the week and then a Bloody Mary on the weekend). He reportedly had a liver ultrasound which looked OK but has been referred to GI. He also report that he was recently diagnosed with stage I prostate cancer and has been seen by Urology and has a prostate MRI planned for 01/03/2022. ?  ?In the ED, EKG shows normal sinus rhythm with no acute ischemic changes. Initial high-sensitivity troponin 77. BNP 129. Chest x-ray  showed no acute findings. WBC 10.9, Hgb 14.1, Plts 288. Na 141, K 3.7, Glucose 176, BUN 14, Cr 0.50. Albumin 3.7, AST 160, ALT 341, Alk Phos 54, Total Bili 0.6. Lipase normal. His BP is markedly elevated as high as 218/125. ? ?Hospital Course  ?   ?Consultants: None  ? ?NSTEMI ?Hs-troponin 77>>88. Treated with heparin. No chest pain on nitro drip. Echo showed LVEF of 45-50% and grade 1 DD. Cath showed 80% stenosed ostial RCA s/p DES PCI. DAPT with ASA and Brilinta. Continue Coreg. ?- No statin right now due to elevated LFTs. ?- Will ambulate with cardiac rehab prior to discharge.  ? ?Systolic Murmur ?Murmur noted on exam. Echo showed mild to moderate aortic valve stenosis. Aortic valve mean  gradient measures 16.0 mmHg. Aortic valve peak gradient measures 25.2  mmHg. Aortic valve area, by VTI measures 1.19 cm?.  ?  ?Hypertension ?BP markedly elevated in the ED as high as 218/125.Treated with nitro drip with improved blood pressure.  ?- Continued home Amlodipine 10mg  daily and Lisinopril.  ?- Resume home HCTZ ?- Added Coreg and titrated.  ?- Advised to keep log of blood pressure.  ?  ?Hyperlipidemia ?Elevated LFTs ?- Previously on Lipitor 20mg  daily at home but this was recently stopped due to elevated LFTs. ?- LFTs are improving ?- Will continue to hold statin for now. ?-12/28/2021: Cholesterol 272; HDL 41; LDL Cholesterol 191; Triglycerides 198; VLDL 40  ?-- Has pending follow-up with GI  ?  ?Type 2 Diabetes Mellitus ?- Treated with SSI ? -HgbA1c 7.4 ?- Resume home medications.  ? ? ?Did the patient have an acute coronary syndrome (MI, NSTEMI, STEMI, etc) this admission?:  Yes                              ? ?AHA/ACC Clinical Performance & Quality Measures: ?Aspirin prescribed? - Yes ?ADP Receptor Inhibitor (Plavix/Clopidogrel, Brilinta/Ticagrelor or Effient/Prasugrel) prescribed (includes medically managed patients)? - Yes ?Beta Blocker prescribed? - Yes ?High Intensity Statin (Lipitor 40-80mg  or Crestor 20-40mg )  prescribed? - No - due to elevated LFTS ?EF assessed during THIS hospitalization? - Yes ?For EF <40%, was ACEI/ARB prescribed? - Yes ?For EF <40%, Aldosterone Antagonist (Spironolactone or Eplerenone) prescribed? - Not Applicable (EF >/= 76%) ?Cardiac Rehab Phase II ordered (including medically managed patients)? - Yes  ? ?The patient will be scheduled for a TOC follow up appointment in 14-15 days.  A message has been sent to the [facility] and Scheduling Pool at the office where the patient should be seen for follow up.  ? ?Discharge Vitals ?Blood pressure (!) 145/82, pulse 90, temperature (!) 97.5 ?F (36.4 ?C), temperature source Oral, resp. rate 16, height 5\' 10"  (1.778 m), weight 81.6 kg, SpO2 97 %.  ?Danley Danker Weights  ? 12/27/21 1757  ?Weight: 81.6 kg  ? ? ?Labs & Radiologic Studies  ?  ?CBC ?Recent Labs  ?  12/27/21 ?1823 12/28/21 ?0259 12/29/21 ?0220  ?WBC 10.9* 13.0* 13.2*  ?NEUTROABS 7.5  --   --   ?  HGB 14.1 14.4 13.8  ?HCT 42.4 44.2 41.9  ?MCV 94.0 94.6 95.2  ?PLT 288 310 323  ? ?Basic Metabolic Panel ?Recent Labs  ?  12/27/21 ?1823 12/28/21 ?0259 12/29/21 ?0220  ?NA 141 142 139  ?K 3.7 3.4* 3.9  ?CL 105 102 104  ?CO2 $Rem'26 30 29  'wJgs$ ?GLUCOSE 176* 138* 66*  ?BUN $Rem'14 12 13  'tgHp$ ?CREATININE 0.50* 0.49* 0.49*  ?CALCIUM 8.9 9.1 9.1  ?MG 1.7  --   --   ? ?Liver Function Tests ?Recent Labs  ?  12/27/21 ?1823 12/28/21 ?0259  ?AST 160* 152*  ?ALT 341* 337*  ?ALKPHOS 54 54  ?BILITOT 0.6 1.1  ?PROT 6.7 6.7  ?ALBUMIN 3.7 3.8  ? ?Recent Labs  ?  12/27/21 ?1823  ?LIPASE 25  ? ?High Sensitivity Troponin:   ?Recent Labs  ?Lab 12/27/21 ?1823 12/27/21 ?1945  ?TROPONINIHS 77* 88*  ?  ?D-Dimer ?Recent Labs  ?  12/28/21 ?0259  ?DDIMER 0.32  ? ?Hemoglobin A1C ?Recent Labs  ?  12/28/21 ?0259  ?HGBA1C 7.4*  ? ?Fasting Lipid Panel ?Recent Labs  ?  12/28/21 ?0259  ?CHOL 272*  ?HDL 41  ?LDLCALC 191*  ?TRIG 198*  ?CHOLHDL 6.6  ? ?CARDIAC CATHETERIZATION ? ?Result Date: 12/28/2021 ?  Ost RCA lesion is 80% stenosed.   A drug-eluting stent was  successfully placed using a STENT ONYX FRONTIER 4.0X18, postdilated to 4.5 mm in diameter and optimized with intravascular ultrasound.   Post intervention, there is a 0% residual stenosis.   The left ventricular sys

## 2021-12-29 NOTE — Progress Notes (Signed)
CARDIAC REHAB PHASE I  ? ?PRE:  Rate/Rhythm: 85 SR ? ?BP:  Sitting: 151/82     ? ?SaO2: 97 RA ? ?MODE:  Ambulation: 400 ft  ? ?POST:  Rate/Rhythm: 99 SR ? ?BP:  Sitting: 162/86 ? ?  SaO2: 98 RA ? ? ?Pt ambulated 464f in hallway independently with steady gait. Pt denies CP, SOB, or dizziness throughout. Pt educated on importance of ASA and Brilinta. Pt given heart healthy and diabetic diets. Reviewed site care, restrictions, and exercise guidelines. Will refer to CRP II [place]. ? ?[phone]?TRufina Falco RN BSN ?12/29/2021 ?9:27 AM ? ?

## 2022-01-02 NOTE — Progress Notes (Signed)
?Cardiology Office Note:   ? ?Date:  01/06/2022  ? ?ID:  [name], DOB 12/22/1955, MRN [id number] ? ?PCP:  Greig Right, MD  ?Cardiologist:  Werner Lean, MD  ?Electrophysiologist:  None  ? ?Referring MD: Greig Right, MD  ? ?Chief Complaint: hospital follow-up of NSTEMI ? ?History of Present Illness:   ? ?[name] is a 66 y.o. male with a history of CAD with recent NSTEMI s/p DES to ostial RCA on 12/28/2021, hypertension, hyperlipidemia, type 2 diabetes, nephrolithiasis, transaminitis, and stage I prostate cancer who is followed by Dr. Eugenio Hoes and presents today for hospital follow-up after NSTEMI. ? ?Patient was first seen by cardiology during recent admission.  He was admitted from 12/27/2021 to 12/29/2021 for NSTEMI after presenting with chest pain and shortness of breath while push mowing his lawn.  High-sensitivity troponin was only minimally elevated at 77 >> 88 but story was concerning for ACS and he was started on IV heparin.  Echo showed LVEF of 45-50% with global hypokinesis and grade 1 diastolic dysfunction as well as mild to moderate AS. LHC on 12/28/2021 showed 80% stenosis of ostial RCA and otherwise only minimal CAD.  He underwent successful PCI with DES to RCA lesion, started on DAPT with Aspirin and Brilinta.  LDL markedly elevated at 191 but he was not started on statin due to recent transaminitis.  BP was also markedly elevated on admission and Coreg was added to regimen. ? ?Patient presents today for follow-up. Here alone. Patient doing well from a cardiac standpoint. He denies any recurrent chest pain or shortness of breath. No orthopnea, PND, lower extremity edema. He did wake up this morning feeling dizzy. He states he has a history of vertigo and states this feels like early stages of this. He denies any current dizziness but states he just feels like his equilibrium is off. He denies any dizziness prior today. He states he had a URI recently and still has a lot of  facial congestion and drainage so he thinks this is contributing to his dizziness. No palpitations or syncope. BP mildly elevated in the office today but he states he has not taken any of his antihypertensives yet. ? ?We checked orthostatic in the office today: ?- Supine: BP 154/80, HR 82 ?- Sitting: BP 130/75, HR 85 ?- Standing 151/78, HR 89 ? ? ?Past Medical History:  ?Diagnosis Date  ? Arthritis   ? Diabetes mellitus   ? fasting 120s  ? History of kidney stones   ? Hypertension   ? Peripheral neuropathy   ? left fifth finger, left toe  ? Pneumonia   ? hx of  ? ? ?Past Surgical History:  ?Procedure Laterality Date  ? APPENDECTOMY    ? CORONARY STENT INTERVENTION N/A 12/28/2021  ? Procedure: CORONARY STENT INTERVENTION;  Surgeon: Jettie Booze, MD;  Location: [place] [facility];  Service: Cardiovascular;  Laterality: N/A;  ? CYSTOSCOPY W/ URETERAL STENT PLACEMENT Bilateral 03/17/2014  ? Procedure: CYSTOSCOPY WITH RIGHT STENT PULL  ;  Surgeon: Alexis Frock, MD;  Location: [facility];  Service: Urology;  Laterality: Bilateral;  ? CYSTOSCOPY WITH RETROGRADE PYELOGRAM, URETEROSCOPY AND STENT PLACEMENT Bilateral 02/01/2014  ? Procedure: CYSTOSCOPY WITH BILATERAL RETROGRADE CYSTOSCOPY, BILATERAL RETROGRADE, PYELOGRAM, RIGHT URETEROSCOPY, STONE BASKETRY,  AND BILATERAL STENT PLACEMENT;  Surgeon: Alexis Frock, MD;  Location: [facility];  Service: Urology;  Laterality: Bilateral;  ? CYSTOSCOPY WITH URETEROSCOPY AND STENT PLACEMENT Left 02/12/2021  ? Procedure: CYSTOSCOPY WITH LEFT URETEROSCOPY, BALLOON DILITATION,  HOLMIUM LASER LITHOTRIPSY AND STENT PLACEMENT;  Surgeon: Lucas Mallow, MD;  Location: [facility];  Service: Urology;  Laterality: Left;  ? HOLMIUM LASER APPLICATION Left 2/50/0370  ? Procedure: HOLMIUM LASER APPLICATION;  Surgeon: Alexis Frock, MD;  Location: [facility];  Service: Urology;  Laterality: Left;  ? INTRAVASCULAR ULTRASOUND/IVUS N/A 12/28/2021  ? Procedure: Intravascular Ultrasound/IVUS;  Surgeon: Jettie Booze, MD;  Location: [place] [facility];  Service: Cardiovascular;  Laterality: N/A;  ? LEFT HEART CATH AND CORONARY ANGIOGRAPHY N/A 12/28/2021  ? Procedure: LEFT HEART CATH AND CORONARY ANGIOGRAPHY;  Surgeon: Jettie Booze, MD;  Location: [place] [facility];  Service: Cardiovascular;  Laterality: N/A;  ? LITHOTRIPSY    ? NEPHROLITHOTOMY Left 03/17/2014  ? Procedure: NEPHROLITHOTOMY PERCUTANEOUS with access;  Surgeon: Alexis Frock, MD;  Location: [facility];  Service: Urology;  Laterality: Left;  ? NEPHROLITHOTOMY Left 03/19/2014  ? Procedure: 2ND STAGE NEPHROLITHOTOMY PERCUTANEOUS left ureteroscopy with lasr stone treatment and stent placement, with access;  Surgeon: Alexis Frock, MD;  Location: [facility];  Service: Urology;  Laterality: Left;  ? TOTAL KNEE ARTHROPLASTY Right 09/02/2014  ? Procedure: RIGHT TOTAL KNEE ARTHROPLASTY;  Surgeon: Meredith Pel, MD;  Location: [facility];  Service: Orthopedics;  Laterality: Right;  ? uretral stent    ? ? ?Current Medications: ?Current Meds  ?Medication Sig  ? amLODipine (NORVASC) 10 MG tablet Take 10 mg by mouth every morning.  ? aspirin 81 MG EC tablet Take 1 tablet (81 mg total) by mouth daily. Swallow whole.  ? carvedilol (COREG) 12.5 MG tablet Take 1 tablet (12.5 mg total) by mouth 2 (two) times daily with a meal.  ? cholecalciferol (VITAMIN D) 1000 UNITS tablet Take 1,000 Units by mouth daily.  ? citalopram (CELEXA) 20 MG tablet Take 20 mg by mouth daily.  ? Cyanocobalamin (VITAMIN B-12) 2500 MCG SUBL Place 1 tablet under the tongue daily.  ? hydrochlorothiazide (HYDRODIURIL) 25 MG tablet Take 12.5 mg by mouth daily.  ? insulin glargine (LANTUS) 100 UNIT/ML injection Inject 30 Units into the skin at bedtime.   ? lisinopril (PRINIVIL,ZESTRIL) 40 MG tablet Take 40 mg by mouth every morning.   ? metFORMIN (GLUCOPHAGE) 500 MG tablet Take 1,000 mg by mouth 2 (two) times daily with a meal.   ? Multiple Vitamins-Minerals (MULTIVITAMINS THER. W/MINERALS) TABS Take 2  tablets by mouth daily.  ? Omega-3 Fatty Acids (FISH OIL) 1200 MG CAPS Take 1,200 mg by mouth daily.  ? pioglitazone (ACTOS) 30 MG tablet Take 30 mg by mouth daily after lunch.  ? ticagrelor (BRILINTA) 90 MG TABS tablet Take 1 tablet (90 mg total) by mouth 2 (two) times daily.  ?  ? ?Allergies:   Patient has no known allergies.  ? ?Social History  ? ?Socioeconomic History  ? Marital status: Widowed  ?  Spouse name: Not on file  ? Number of children: Not on file  ? Years of education: Not on file  ? Highest education level: Not on file  ?Occupational History  ? Not on file  ?Tobacco Use  ? Smoking status: Never  ? Smokeless tobacco: Never  ?Vaping Use  ? Vaping Use: Never used  ?Substance and Sexual Activity  ? Alcohol use: Yes  ?  Alcohol/week: 2.0 standard drinks  ?  Types: 2 Cans of beer per week  ?  Comment: occasional/ 1-2 beers week  ? Drug use: No  ? Sexual activity: Yes  ?  Birth control/protection: Other-see comments  ?  Other Topics Concern  ? Not on file  ?Social History Narrative  ? Not on file  ? ?Social Determinants of Health  ? ?Financial Resource Strain: Not on file  ?Food Insecurity: Not on file  ?Transportation Needs: Not on file  ?Physical Activity: Not on file  ?Stress: Not on file  ?Social Connections: Not on file  ?  ? ?Family History: ?The patient's family history includes Alzheimer's disease in his mother; Heart attack in his father. ? ?ROS:   ?Please see the history of present illness.    ? ?EKGs/Labs/Other Studies Reviewed:   ? ?The following studies were reviewed today: ? ?Echocardiogram 12/28/2021: ?Impressions: ?1. Left ventricular ejection fraction, by estimation, is 45 to 50%. The  ?left ventricle has mildly decreased function. The left ventricle  ?demonstrates global hypokinesis. Left ventricular diastolic parameters are  ?consistent with Grade I diastolic  ?dysfunction (impaired relaxation).  ? 2. Right ventricular systolic function is normal. The right ventricular  ?size is normal.  ? 3.  Left atrial size was moderately dilated.  ? 4. The mitral valve is normal in structure. No evidence of mitral valve  ?regurgitation. No evidence of mitral stenosis.  ? 5. The aortic valve is tricuspid. Ther

## 2022-01-03 ENCOUNTER — Ambulatory Visit
Admission: RE | Admit: 2022-01-03 | Discharge: 2022-01-03 | Disposition: A | Discharge status: Home / Self Care | Payer: Medicare Other | Source: Ambulatory Visit | Attending: Urology | Admitting: Urology

## 2022-01-03 DIAGNOSIS — N138 Other obstructive and reflux uropathy: Secondary | ICD-10-CM

## 2022-01-03 DIAGNOSIS — R590 Localized enlarged lymph nodes: Secondary | ICD-10-CM | POA: Diagnosis not present

## 2022-01-03 IMAGING — MR MR PROSTATE WO/W CM
56 series · 56 of 56 positions shown · IV contrast (18 ml multihance)
Comparison: None.

EXAM:
MR PROSTATE WITHOUT AND WITH CONTRAST
TECHNIQUE: Multiplanar multisequence MRI images were obtained of the pelvis
centered about the prostate. Pre and post contrast images were
obtained.

CONTRAST:  18mL MULTIHANCE GADOBENATE DIMEGLUMINE 529 MG/ML IV SOLN

[Series 3: T1 · axial · 8.0mm · 1.06mm/px · 1 of 28 slices shown]
[im 1/28]
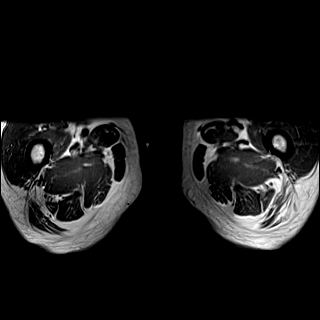

[Series 4: bSSFP fat-sat · axial · 8.0mm · 0.74mm/px · 1 of 28 slices shown]
[im 1/28]
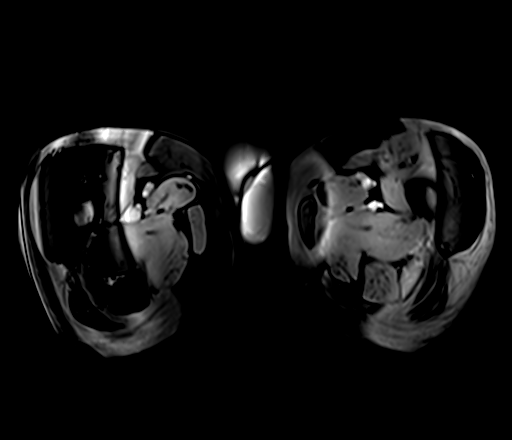

[Series 6: T2 · coronal · 3.5mm · 0.56mm/px · 1 of 23 slices shown (1 of 3)]
[im 1/23]
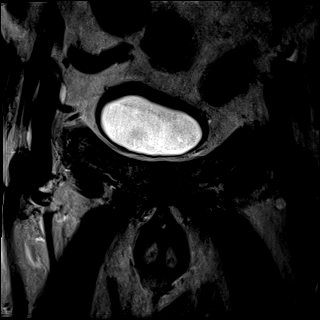

[Series 7: T2 · axial · 3.5mm · 0.56mm/px · 1 of 23 slices shown (2 of 3)]
[im 1/23]
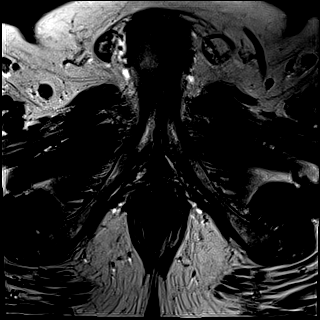

[Series 8: T2 · axial · 1.0mm · 1.04mm/px · 1 of 80 slices shown (3 of 3)]
[im 1/80]
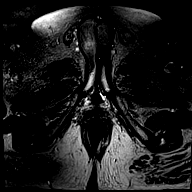

[Series 9: DWI · axial · 3.5mm · 1.75mm/px · 1 of 60 slices shown (1 of 2)]
[im 1/60]
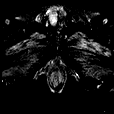

[Series 10: DWI · axial · 3.5mm · 1.75mm/px · 1 of 20 slices shown (2 of 2)]
[im 1/20]
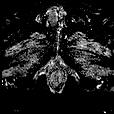

[Series 11: pre t1_twist_tra_dyn_ttc=5.3s · axial · non-contrast · 3.5mm · 0.83mm/px · 1 of 20 slices shown]
[im 1/20]
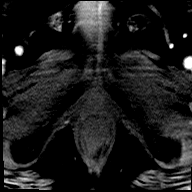

[Series 12: post t1_twist_tra_dyn-copy center · axial · 3.5mm · 0.83mm/px · 1 of 20 slices shown (1 of 25)]
[im 1/20]
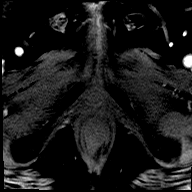

[Series 13: post t1_twist_tra_dyn-copy center · axial · 3.5mm · 0.83mm/px · 1 of 20 slices shown (2 of 25)]
[im 1/20]
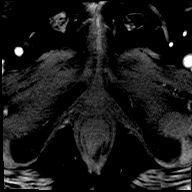

[Series 14: post t1_twist_tra_dyn-copy cent_sub_ttc=(id) · axial · 3.5mm · 0.83mm/px · 1 of 18 slices shown (1 of 23)]
[im 1/18]
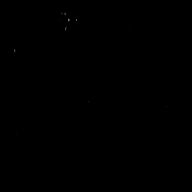

[Series 15: post t1_twist_tra_dyn-copy center · axial · 3.5mm · 0.83mm/px · 1 of 20 slices shown (3 of 25)]
[im 1/20]
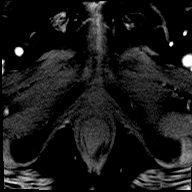

[Series 16: post t1_twist_tra_dyn-copy cent_sub_ttc=(id) · axial · 3.5mm · 0.83mm/px · 1 of 20 slices shown (2 of 23)]
[im 1/20]
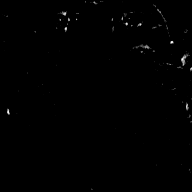

[Series 17: post t1_twist_tra_dyn-copy center · axial · 3.5mm · 0.83mm/px · 1 of 20 slices shown (4 of 25)]
[im 1/20]
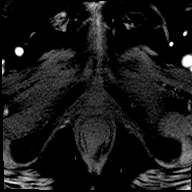

[Series 18: post t1_twist_tra_dyn-copy cent_sub_ttc=(id) · axial · 3.5mm · 0.83mm/px · 1 of 19 slices shown (3 of 23)]
[im 1/19]
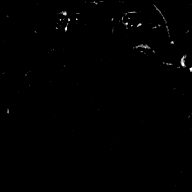

[Series 19: post t1_twist_tra_dyn-copy center · axial · 3.5mm · 0.83mm/px · 1 of 20 slices shown (5 of 25)]
[im 1/20]
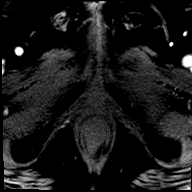

[Series 20: post t1_twist_tra_dyn-copy cent_sub_ttc=(id) · axial · 3.5mm · 0.83mm/px · 1 of 20 slices shown (4 of 23)]
[im 1/20]
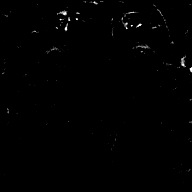

[Series 21: post t1_twist_tra_dyn-copy center · axial · 3.5mm · 0.83mm/px · 1 of 20 slices shown (6 of 25)]
[im 1/20]
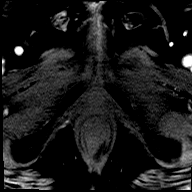

[Series 22: post t1_twist_tra_dyn-copy cent_sub_ttc=(id) · axial · 3.5mm · 0.83mm/px · 1 of 20 slices shown (5 of 23)]
[im 1/20]
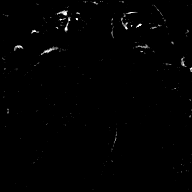

[Series 23: post t1_twist_tra_dyn-copy center · axial · 3.5mm · 0.83mm/px · 1 of 20 slices shown (7 of 25)]
[im 1/20]
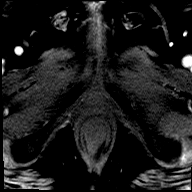

[Series 24: post t1_twist_tra_dyn-copy cent_sub_ttc=(id) · axial · 3.5mm · 0.83mm/px · 1 of 20 slices shown (6 of 23)]
[im 1/20]
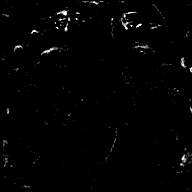

[Series 25: post t1_twist_tra_dyn-copy center · axial · 3.5mm · 0.83mm/px · 1 of 20 slices shown (8 of 25)]
[im 1/20]
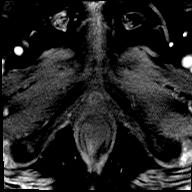

[Series 26: post t1_twist_tra_dyn-copy cent_sub_ttc=(id) · axial · 3.5mm · 0.83mm/px · 1 of 20 slices shown (7 of 23)]
[im 1/20]
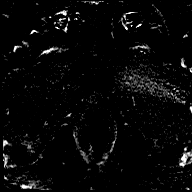

[Series 27: post t1_twist_tra_dyn-copy center · axial · 3.5mm · 0.83mm/px · 1 of 20 slices shown (9 of 25)]
[im 1/20]
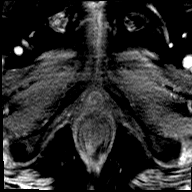

[Series 28: post t1_twist_tra_dyn-copy cent_sub_ttc=(id) · axial · 3.5mm · 0.83mm/px · 1 of 20 slices shown (8 of 23)]
[im 1/20]
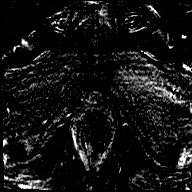

[Series 29: post t1_twist_tra_dyn-copy center · axial · 3.5mm · 0.83mm/px · 1 of 20 slices shown (10 of 25)]
[im 1/20]
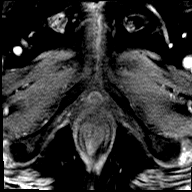

[Series 30: post t1_twist_tra_dyn-copy cent_sub_ttc=(id) · axial · 3.5mm · 0.83mm/px · 1 of 20 slices shown (9 of 23)]
[im 1/20]
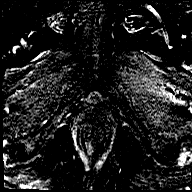

[Series 31: post t1_twist_tra_dyn-copy center · axial · 3.5mm · 0.83mm/px · 1 of 20 slices shown (11 of 25)]
[im 1/20]
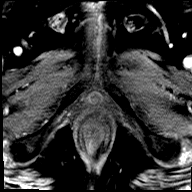

[Series 32: post t1_twist_tra_dyn-copy cent_sub_ttc=(id) · axial · 3.5mm · 0.83mm/px · 1 of 20 slices shown (10 of 23)]
[im 1/20]
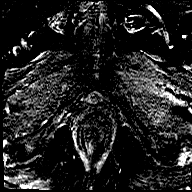

[Series 33: post t1_twist_tra_dyn-copy center · axial · 3.5mm · 0.83mm/px · 1 of 20 slices shown (12 of 25)]
[im 1/20]
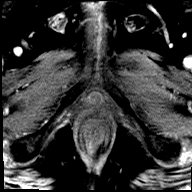

[Series 34: post t1_twist_tra_dyn-copy cent_sub_ttc=(id) · axial · 3.5mm · 0.83mm/px · 1 of 20 slices shown (11 of 23)]
[im 1/20]
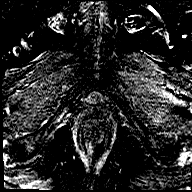

[Series 35: post t1_twist_tra_dyn-copy center · axial · 3.5mm · 0.83mm/px · 1 of 20 slices shown (13 of 25)]
[im 1/20]
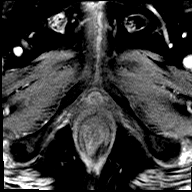

[Series 36: post t1_twist_tra_dyn-copy cent_sub_ttc=(id) · axial · 3.5mm · 0.83mm/px · 1 of 20 slices shown (12 of 23)]
[im 1/20]
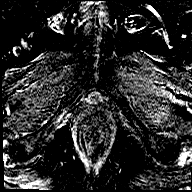

[Series 37: post t1_twist_tra_dyn-copy center · axial · 3.5mm · 0.83mm/px · 1 of 20 slices shown (14 of 25)]
[im 1/20]
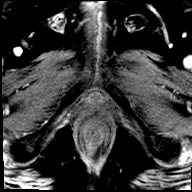

[Series 38: post t1_twist_tra_dyn-copy cent_sub_ttc=(id) · axial · 3.5mm · 0.83mm/px · 1 of 20 slices shown (13 of 23)]
[im 1/20]
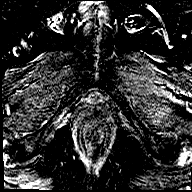

[Series 39: post t1_twist_tra_dyn-copy center · axial · 3.5mm · 0.83mm/px · 1 of 20 slices shown (15 of 25)]
[im 1/20]
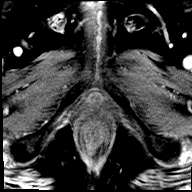

[Series 40: post t1_twist_tra_dyn-copy cent_sub_ttc=(id) · axial · 3.5mm · 0.83mm/px · 1 of 20 slices shown (14 of 23)]
[im 1/20]
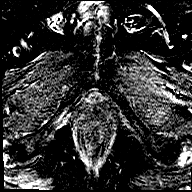

[Series 41: post t1_twist_tra_dyn-copy center · axial · 3.5mm · 0.83mm/px · 1 of 20 slices shown (16 of 25)]
[im 1/20]
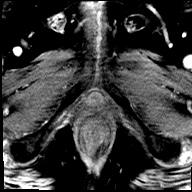

[Series 42: post t1_twist_tra_dyn-copy cent_sub_ttc=(id) · axial · 3.5mm · 0.83mm/px · 1 of 20 slices shown (15 of 23)]
[im 1/20]
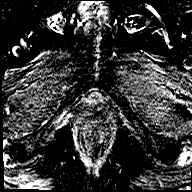

[Series 43: post t1_twist_tra_dyn-copy center · axial · 3.5mm · 0.83mm/px · 1 of 20 slices shown (17 of 25)]
[im 1/20]
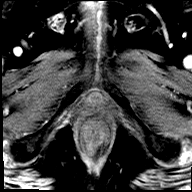

[Series 44: post t1_twist_tra_dyn-copy cent_sub_ttc=(id) · axial · 3.5mm · 0.83mm/px · 1 of 20 slices shown (16 of 23)]
[im 1/20]
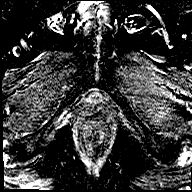

[Series 45: post t1_twist_tra_dyn-copy center · axial · 3.5mm · 0.83mm/px · 1 of 20 slices shown (18 of 25)]
[im 1/20]
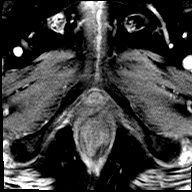

[Series 46: post t1_twist_tra_dyn-copy cent_sub_ttc=(id) · axial · 3.5mm · 0.83mm/px · 1 of 20 slices shown (17 of 23)]
[im 1/20]
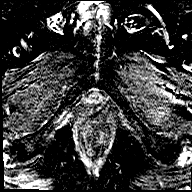

[Series 47: post t1_twist_tra_dyn-copy center · axial · 3.5mm · 0.83mm/px · 1 of 20 slices shown (19 of 25)]
[im 1/20]
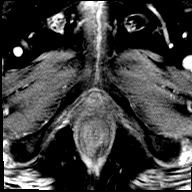

[Series 48: post t1_twist_tra_dyn-copy cent_sub_ttc=(id) · axial · 3.5mm · 0.83mm/px · 1 of 20 slices shown (18 of 23)]
[im 1/20]
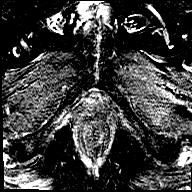

[Series 49: post t1_twist_tra_dyn-copy center · axial · 3.5mm · 0.83mm/px · 1 of 20 slices shown (20 of 25)]
[im 1/20]
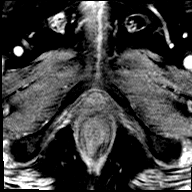

[Series 50: post t1_twist_tra_dyn-copy cent_sub_ttc=(id) · axial · 3.5mm · 0.83mm/px · 1 of 20 slices shown (19 of 23)]
[im 1/20]
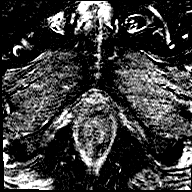

[Series 51: post t1_twist_tra_dyn-copy center · axial · 3.5mm · 0.83mm/px · 1 of 20 slices shown (21 of 25)]
[im 1/20]
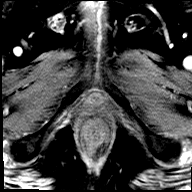

[Series 52: post t1_twist_tra_dyn-copy cent_sub_ttc=(id) · axial · 3.5mm · 0.83mm/px · 1 of 20 slices shown (20 of 23)]
[im 1/20]
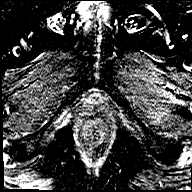

[Series 53: post t1_twist_tra_dyn-copy center · axial · 3.5mm · 0.83mm/px · 1 of 20 slices shown (22 of 25)]
[im 1/20]
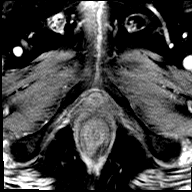

[Series 54: post t1_twist_tra_dyn-copy cent_sub_ttc=(id) · axial · 3.5mm · 0.83mm/px · 1 of 20 slices shown (21 of 23)]
[im 1/20]
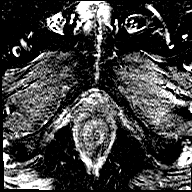

[Series 55: post t1_twist_tra_dyn-copy center · axial · 3.5mm · 0.83mm/px · 1 of 20 slices shown (23 of 25)]
[im 1/20]
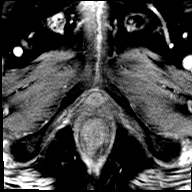

[Series 56: post t1_twist_tra_dyn-copy cent_sub_ttc=(id) · axial · 3.5mm · 0.83mm/px · 1 of 20 slices shown (22 of 23)]
[im 1/20]
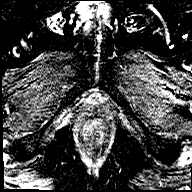

[Series 57: post t1_twist_tra_dyn-copy center · axial · 3.5mm · 0.83mm/px · 1 of 20 slices shown (24 of 25)]
[im 1/20]
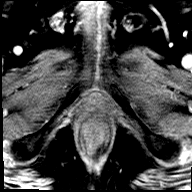

[Series 58: post t1_twist_tra_dyn-copy cent_sub_ttc=(id) · axial · 3.5mm · 0.83mm/px · 1 of 20 slices shown (23 of 23)]
[im 1/20]
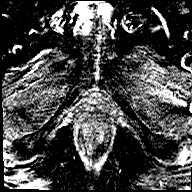

[Series 59: post t1_twist_tra_dyn-copy center · axial · 3.5mm · 0.83mm/px · 1 of 20 slices shown (25 of 25)]
[im 1/20]
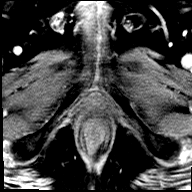

[56 of 56 positions shown; findings below may reference images not displayed]

FINDINGS: Prostate: Hazy low T2 signal throughout the peripheral zone in a non
focal manner, likely postinflammatory and considered PI-RADS
category 2.

No focal findings of intermediate or higher suspicion for prostate
cancer identified.

Volume: 3D volumetric analysis: Prostate volume 30.74 cc (4.9 by
by 3.6 cm).

Transcapsular spread:  Absent

Seminal vesicle involvement: Absent

Neurovascular bundle involvement: Absent

Pelvic adenopathy: There is a 1.1 cm in short axis right external
iliac lymph node on image 14 series 4, but this is chronic and
probably incidental as this lymph node measures 1.4 cm in short axis
back on [DATE].

Bone metastasis: Absent

Other findings: No supplemental non-categorized findings.
IMPRESSION: 1. No findings of intermediate or higher suspicion for prostate
cancer identified.
2. Hazy low T2 signal throughout the peripheral zone is likely
postinflammatory and considered PI-RADS category 2.
3. A mildly prominent right external iliac lymph node measures
cm in short axis, this previously measured 1.4 cm in short axis back
on [DATE] and accordingly is likely simply chronically mildly
prominent.

## 2022-01-03 MED ORDER — GADOBENATE DIMEGLUMINE 529 MG/ML IV SOLN
18.0000 mL | Freq: Once | INTRAVENOUS | Status: AC | PRN
Start: 2022-01-03 — End: 2022-01-03
  Administered 2022-01-03: 18 mL via INTRAVENOUS

## 2022-01-04 ENCOUNTER — Telehealth (HOSPITAL_COMMUNITY): Payer: Self-pay

## 2022-01-04 ENCOUNTER — Other Ambulatory Visit (HOSPITAL_COMMUNITY): Payer: Self-pay

## 2022-01-04 DIAGNOSIS — E7800 Pure hypercholesterolemia, unspecified: Secondary | ICD-10-CM | POA: Diagnosis not present

## 2022-01-04 DIAGNOSIS — I10 Essential (primary) hypertension: Secondary | ICD-10-CM | POA: Diagnosis not present

## 2022-01-04 DIAGNOSIS — E1121 Type 2 diabetes mellitus with diabetic nephropathy: Secondary | ICD-10-CM | POA: Diagnosis not present

## 2022-01-04 DIAGNOSIS — I25810 Atherosclerosis of coronary artery bypass graft(s) without angina pectoris: Secondary | ICD-10-CM | POA: Diagnosis not present

## 2022-01-04 NOTE — Telephone Encounter (Signed)
Pharmacy Transitions of Care Follow-up Telephone Call ? ?Date of discharge: 12/29/21  ?Discharge Diagnosis: NSTEMI ? ?How have you been since you were released from the hospital? Patient tired but doing well since discharge. Returned to work today. No questions about meds at this time.  ? ?Medication changes made at discharge: ?    START taking: ?aspirin  ?carvedilol (COREG)  ?nitroGLYCERIN (NITROSTAT)  ?ticagrelor (BRILINTA)  ?STOP taking: ?HYDROcodone-acetaminophen 5-325 MG tablet (Norco)  ?oxyCODONE-acetaminophen 5-325 MG tablet (PERCOCET/ROXICET)  ? ?Medication changes verified by the patient? Yes ?  ? ?Medication Accessibility: ? ?Home Pharmacy: [facility]  ? ?Was the patient provided with refills on discharged medications? Yes  ? ?Have all prescriptions been transferred from [facility] to home pharmacy? Yes  ? ?Is the patient able to afford medications? Has insurance ?  ? ?Medication Review: ? ?TICAGRELOR (BRILINTA) ?Ticagrelor 90 mg BID initiated on 12/29/21.  ?- Educated patient on expected duration of therapy of aspirin with ticagrelor.  ?- Advised patient of medications to avoid (NSAIDs, aspirin maintenance doses>100 mg daily) ?- Educated that Tylenol (acetaminophen) will be the preferred analgesic to prevent risk of bleeding  ?- Emphasized importance of monitoring for signs and symptoms of bleeding (abnormal bruising, prolonged bleeding, nose bleeds, bleeding from gums, discolored urine, black tarry stools)  ?- Educated patient to notify doctor if shortness of breath or abnormal heartbeat occur ?- Advised patient to alert all providers of antiplatelet therapy prior to starting a new medication or having a procedure  ? ?Follow-up Appointments: ? ?[facility] f/u appt confirmed? Scheduled to see Dr. Sarajane Jews on 01/06/22 @ 10:05AM.  ? ?If their condition worsens, is the pt aware to call PCP or go to the Emergency Dept.? Yes ? ?Final Patient Assessment: ?Patient has f/u scheduled and  refills at home pharmacy ? ?

## 2022-01-05 ENCOUNTER — Telehealth (HOSPITAL_COMMUNITY): Payer: Self-pay

## 2022-01-05 NOTE — Telephone Encounter (Signed)
Attempted to call patient in regards to Cardiac Rehab - LM on VM 

## 2022-01-05 NOTE — Telephone Encounter (Signed)
Pt insurance is active and benefits verified through [facility] Co-pay 0, DED 0/0 met, out of pocket $5,650/0 met, co-insurance 0%. no pre-authorization required. Passport, 01/05/2022@3 :53pm, REF# [id number] ?  ?Will contact patient to see if he is interested in the Cardiac Rehab Program. If interested, patient will need to complete follow up appt. Once completed, patient will be contacted for scheduling upon review by the RN Navigator. ?

## 2022-01-06 ENCOUNTER — Ambulatory Visit: Payer: Medicare Other | Admitting: Student

## 2022-01-06 ENCOUNTER — Encounter: Payer: Self-pay | Admitting: Student

## 2022-01-06 VITALS — BP 132/76 | HR 82 | Ht 70.0 in | Wt 182.0 lb

## 2022-01-06 DIAGNOSIS — I10 Essential (primary) hypertension: Secondary | ICD-10-CM

## 2022-01-06 DIAGNOSIS — I2510 Atherosclerotic heart disease of native coronary artery without angina pectoris: Secondary | ICD-10-CM

## 2022-01-06 DIAGNOSIS — I5020 Unspecified systolic (congestive) heart failure: Secondary | ICD-10-CM | POA: Diagnosis not present

## 2022-01-06 DIAGNOSIS — E785 Hyperlipidemia, unspecified: Secondary | ICD-10-CM | POA: Diagnosis not present

## 2022-01-06 DIAGNOSIS — E118 Type 2 diabetes mellitus with unspecified complications: Secondary | ICD-10-CM

## 2022-01-06 DIAGNOSIS — Z794 Long term (current) use of insulin: Secondary | ICD-10-CM

## 2022-01-06 DIAGNOSIS — R42 Dizziness and giddiness: Secondary | ICD-10-CM

## 2022-01-06 NOTE — Patient Instructions (Signed)
Medication Instructions:  ?No changes ?*If you need a refill on your cardiac medications before your next appointment, please call your pharmacy* ? ? ?Lab Work: ?None ?If you have labs (blood work) drawn today and your tests are completely normal, you will receive your results only by: ?MyChart Message (if you have MyChart) OR ?A paper copy in the mail ?If you have any lab test that is abnormal or we need to change your treatment, we will call you to review the results. ? ? ?Testing/Procedures: ?No Testing ? ? ?Follow-Up: ?At [facility], you and your health needs are our priority.  As part of our continuing mission to provide you with exceptional heart care, we have created designated Provider Care Teams.  These Care Teams include your primary Cardiologist (physician) and Advanced Practice Providers (APPs -  Physician Assistants and Nurse Practitioners) who all work together to provide you with the care you need, when you need it. ? ?We recommend signing up for the patient portal called "MyChart".  Sign up information is provided on this After Visit Summary.  MyChart is used to connect with patients for Virtual Visits (Telemedicine).  Patients are able to view lab/test results, encounter notes, upcoming appointments, etc.  Non-urgent messages can be sent to your provider as well.   ?To learn more about what you can do with MyChart, go to [email or web address].   ? ?Your next appointment:   ?3 month(s) ? ?The format for your next appointment:   ?In Person ? ?Provider:   ?Werner Lean, MD { ? ? ? ? ?Important Information About Sugar ? ? ? ? ?  ?

## 2022-01-07 ENCOUNTER — Telehealth (HOSPITAL_COMMUNITY): Payer: Self-pay | Admitting: *Deleted

## 2022-01-07 NOTE — Telephone Encounter (Signed)
[name] returned call.  He is very interested in participating in Cardiac Rehab.  Advised of general information for Cardiac rehab.  Pt aware he is on the wait list for scheduling.  Reviewed possible phone numbers that may be used for scheduling.  Verbalized understanding. Cherre Huger, BSN ?Cardiac and Pulmonary Rehab Nurse Navigator  ? ?

## 2022-01-10 DIAGNOSIS — N202 Calculus of kidney with calculus of ureter: Secondary | ICD-10-CM | POA: Diagnosis not present

## 2022-01-16 NOTE — Progress Notes (Signed)
?Cardiology Office Note:   ? ?Date:  01/17/2022  ? ?ID:  [name], DOB Jul 22, 1956, MRN [id number] ? ?PCP:  Greig Right, MD  ?[facility] Cardiologist:  Werner Lean, MD  ?[facility] Electrophysiologist:  None  ? ?Chief Complaint: 2 weeks follow-up ? ?History of Present Illness:   ? ?[name] is a 66 y.o. male with a hx of of CAD with recent NSTEMI s/p DES to ostial RCA on 12/28/2021, hypertension, hyperlipidemia, type 2 diabetes, nephrolithiasis, transaminitis, and stage I prostate cancer presents for follow-up. ? ?He was admitted from 12/27/2021 to 12/29/2021 for NSTEMI after presenting with chest pain and shortness of breath while push mowing his lawn.  High-sensitivity troponin was only minimally elevated at 77 >> 88 but story was concerning for ACS and he was started on IV heparin.  Echo showed LVEF of 45-50% with global hypokinesis and grade 1 diastolic dysfunction as well as mild to moderate AS. LHC on 12/28/2021 showed 80% stenosis of ostial RCA and otherwise only minimal CAD.  He underwent successful PCI with DES to RCA lesion, started on DAPT with Aspirin and Brilinta.  LDL markedly elevated at 191 but he was not started on statin due to recent transaminitis.  BP was also markedly elevated on admission and Coreg was added to regimen. ? ?Seen on follow-up 4/13.  Reported feeling dizziness in the morning.  Still recovering from URI with head congestion. - Orthostatic were checked in the office and BP dropped from 154/80 supine to 130/75 sitting bu then returned to 151/78 with standing.  Felt symptoms secondary to URI.  Recommended over-the-counter Flonase or antihistamine. ?  ?Here today for follow-up.  Patient noticed systolic blood pressure in 100s in the morning.  He stopped taking his evening dose of carvedilol with reevaluation of dizziness and improved blood pressure.  For past 4 days, he is now back on 12.5 dose of carvedilol twice a day.  He has spread about his medication.   Blood pressure improved.  Dizziness very seldom.  This been improving gradually.  Denies chest pain, shortness of breath, orthopnea, PND, syncope. ? ?Past Medical History:  ?Diagnosis Date  ? Arthritis   ? Diabetes mellitus   ? fasting 120s  ? History of kidney stones   ? Hypertension   ? Peripheral neuropathy   ? left fifth finger, left toe  ? Pneumonia   ? hx of  ? ? ?Past Surgical History:  ?Procedure Laterality Date  ? APPENDECTOMY    ? CORONARY STENT INTERVENTION N/A 12/28/2021  ? Procedure: CORONARY STENT INTERVENTION;  Surgeon: Jettie Booze, MD;  Location: [facility];  Service: Cardiovascular;  Laterality: N/A;  ? CYSTOSCOPY W/ URETERAL STENT PLACEMENT Bilateral 03/17/2014  ? Procedure: CYSTOSCOPY WITH RIGHT STENT PULL  ;  Surgeon: Alexis Frock, MD;  Location: [facility];  Service: Urology;  Laterality: Bilateral;  ? CYSTOSCOPY WITH RETROGRADE PYELOGRAM, URETEROSCOPY AND STENT PLACEMENT Bilateral 02/01/2014  ? Procedure: CYSTOSCOPY WITH BILATERAL RETROGRADE CYSTOSCOPY, BILATERAL RETROGRADE, PYELOGRAM, RIGHT URETEROSCOPY, STONE BASKETRY,  AND BILATERAL STENT PLACEMENT;  Surgeon: Alexis Frock, MD;  Location: [facility];  Service: Urology;  Laterality: Bilateral;  ? CYSTOSCOPY WITH URETEROSCOPY AND STENT PLACEMENT Left 02/12/2021  ? Procedure: CYSTOSCOPY WITH LEFT URETEROSCOPY, BALLOON DILITATION, HOLMIUM LASER LITHOTRIPSY AND STENT PLACEMENT;  Surgeon: Lucas Mallow, MD;  Location: [facility];  Service: Urology;  Laterality: Left;  ? HOLMIUM LASER APPLICATION Left 3/41/9379  ? Procedure: HOLMIUM LASER APPLICATION;  Surgeon: Alexis Frock,  MD;  Location: [facility];  Service: Urology;  Laterality: Left;  ? INTRAVASCULAR ULTRASOUND/IVUS N/A 12/28/2021  ? Procedure: Intravascular Ultrasound/IVUS;  Surgeon: Jettie Booze, MD;  Location: [facility];  Service: Cardiovascular;  Laterality: N/A;  ? LEFT HEART CATH AND CORONARY ANGIOGRAPHY N/A 12/28/2021  ? Procedure: LEFT HEART CATH AND CORONARY  ANGIOGRAPHY;  Surgeon: Jettie Booze, MD;  Location: [facility];  Service: Cardiovascular;  Laterality: N/A;  ? LITHOTRIPSY    ? NEPHROLITHOTOMY Left 03/17/2014  ? Procedure: NEPHROLITHOTOMY PERCUTANEOUS with access;  Surgeon: Alexis Frock, MD;  Location: [facility];  Service: Urology;  Laterality: Left;  ? NEPHROLITHOTOMY Left 03/19/2014  ? Procedure: 2ND STAGE NEPHROLITHOTOMY PERCUTANEOUS left ureteroscopy with lasr stone treatment and stent placement, with access;  Surgeon: Alexis Frock, MD;  Location: [facility];  Service: Urology;  Laterality: Left;  ? TOTAL KNEE ARTHROPLASTY Right 09/02/2014  ? Procedure: RIGHT TOTAL KNEE ARTHROPLASTY;  Surgeon: Meredith Pel, MD;  Location: [facility];  Service: Orthopedics;  Laterality: Right;  ? uretral stent    ? ? ?Current Medications: ?Current Meds  ?Medication Sig  ? amLODipine (NORVASC) 10 MG tablet Take 10 mg by mouth every morning.  ? aspirin 81 MG EC tablet Take 1 tablet (81 mg total) by mouth daily. Swallow whole.  ? carvedilol (COREG) 12.5 MG tablet Take 1 tablet (12.5 mg total) by mouth 2 (two) times daily with a meal.  ? cholecalciferol (VITAMIN D) 1000 UNITS tablet Take 1,000 Units by mouth daily.  ? citalopram (CELEXA) 20 MG tablet Take 20 mg by mouth daily.  ? Cyanocobalamin (VITAMIN B-12) 2500 MCG SUBL Place 1 tablet under the tongue daily.  ? cyclobenzaprine (FLEXERIL) 10 MG tablet TAKE 1 TABLET BY MOUTH TWICE DAILY  ? diclofenac Sodium (VOLTAREN) 1 % GEL Apply 2 g topically 4 (four) times daily.  ? hydrochlorothiazide (HYDRODIURIL) 25 MG tablet Take 12.5 mg by mouth daily.  ? insulin glargine (LANTUS) 100 UNIT/ML injection Inject 30 Units into the skin at bedtime.   ? lisinopril (PRINIVIL,ZESTRIL) 40 MG tablet Take 40 mg by mouth every morning.   ? metFORMIN (GLUCOPHAGE) 500 MG tablet Take 1,000 mg by mouth 2 (two) times daily with a meal.   ? Multiple Vitamins-Minerals (MULTIVITAMINS THER. W/MINERALS) TABS Take 2 tablets by mouth daily.  ?  nitroGLYCERIN (NITROSTAT) 0.4 MG SL tablet Place 1 tablet (0.4 mg total) under the tongue every 5 (five) minutes x 3 doses as needed for chest pain.  ? Omega-3 Fatty Acids (FISH OIL) 1200 MG CAPS Take 1,200 mg by mouth daily.  ? pioglitazone (ACTOS) 30 MG tablet Take 30 mg by mouth daily after lunch.  ? ticagrelor (BRILINTA) 90 MG TABS tablet Take 1 tablet (90 mg total) by mouth 2 (two) times daily.  ?  ? ?Allergies:   Patient has no known allergies.  ? ?Social History  ? ?Socioeconomic History  ? Marital status: Widowed  ?  Spouse name: Not on file  ? Number of children: Not on file  ? Years of education: Not on file  ? Highest education level: Not on file  ?Occupational History  ? Not on file  ?Tobacco Use  ? Smoking status: Never  ? Smokeless tobacco: Never  ?Vaping Use  ? Vaping Use: Never used  ?Substance and Sexual Activity  ? Alcohol use: Yes  ?  Alcohol/week: 2.0 standard drinks  ?  Types: 2 Cans of beer per week  ?  Comment: occasional/ 1-2  beers week  ? Drug use: No  ? Sexual activity: Yes  ?  Birth control/protection: Other-see comments  ?Other Topics Concern  ? Not on file  ?Social History Narrative  ? Not on file  ? ?Social Determinants of Health  ? ?Financial Resource Strain: Not on file  ?Food Insecurity: Not on file  ?Transportation Needs: Not on file  ?Physical Activity: Not on file  ?Stress: Not on file  ?Social Connections: Not on file  ?  ? ?Family History: ?The patient's family history includes Alzheimer's disease in his mother; Heart attack in his father.   ? ?ROS:   ?Please see the history of present illness.    ?All other systems reviewed and are negative.  ? ?EKGs/Labs/Other Studies Reviewed:   ? ?The following studies were reviewed today: ? ?Echocardiogram 12/28/2021: ?Impressions: ?1. Left ventricular ejection fraction, by estimation, is 45 to 50%. The  ?left ventricle has mildly decreased function. The left ventricle  ?demonstrates global hypokinesis. Left ventricular diastolic parameters  are  ?consistent with Grade I diastolic  ?dysfunction (impaired relaxation).  ? 2. Right ventricular systolic function is normal. The right ventricular  ?size is normal.  ? 3. Left atrial size was moderately dilated.  ?

## 2022-01-17 ENCOUNTER — Encounter: Payer: Self-pay | Admitting: Physician Assistant

## 2022-01-17 ENCOUNTER — Ambulatory Visit: Payer: Medicare Other | Admitting: Physician Assistant

## 2022-01-17 VITALS — BP 152/90 | HR 87 | Ht 70.0 in | Wt 183.4 lb

## 2022-01-17 DIAGNOSIS — E785 Hyperlipidemia, unspecified: Secondary | ICD-10-CM

## 2022-01-17 DIAGNOSIS — R42 Dizziness and giddiness: Secondary | ICD-10-CM

## 2022-01-17 DIAGNOSIS — I2510 Atherosclerotic heart disease of native coronary artery without angina pectoris: Secondary | ICD-10-CM

## 2022-01-17 DIAGNOSIS — I5020 Unspecified systolic (congestive) heart failure: Secondary | ICD-10-CM

## 2022-01-17 NOTE — Patient Instructions (Signed)
Medication Instructions:  ?Your physician recommends that you continue on your current medications as directed. Please refer to the Current Medication list given to you today. ? ?*If you need a refill on your cardiac medications before your next appointment, please call your pharmacy* ? ? ?Lab Work: ?None ?If you have labs (blood work) drawn today and your tests are completely normal, you will receive your results only by: ?MyChart Message (if you have MyChart) OR ?A paper copy in the mail ?If you have any lab test that is abnormal or we need to change your treatment, we will call you to review the results. ? ? ?Follow-Up: ?At [facility], you and your health needs are our priority.  As part of our continuing mission to provide you with exceptional heart care, we have created designated Provider Care Teams.  These Care Teams include your primary Cardiologist (physician) and Advanced Practice Providers (APPs -  Physician Assistants and Nurse Practitioners) who all work together to provide you with the care you need, when you need it. ? ? ?Your next appointment:   ?6-8 week(s) ? ?The format for your next appointment:   ?In Person ? ?Provider:   ?Robbie Lis, PA-C      ?Important Information About Sugar ? ? ? ? ?  ?

## 2022-01-27 DIAGNOSIS — C61 Malignant neoplasm of prostate: Secondary | ICD-10-CM | POA: Diagnosis not present

## 2022-01-27 DIAGNOSIS — R5383 Other fatigue: Secondary | ICD-10-CM | POA: Diagnosis not present

## 2022-01-27 DIAGNOSIS — I21A9 Other myocardial infarction type: Secondary | ICD-10-CM | POA: Diagnosis not present

## 2022-01-27 DIAGNOSIS — Z79899 Other long term (current) drug therapy: Secondary | ICD-10-CM | POA: Diagnosis not present

## 2022-01-28 ENCOUNTER — Ambulatory Visit: Payer: Medicare Other | Admitting: Pharmacist

## 2022-01-28 ENCOUNTER — Encounter: Payer: Self-pay | Admitting: Pharmacist

## 2022-01-28 DIAGNOSIS — E785 Hyperlipidemia, unspecified: Secondary | ICD-10-CM | POA: Diagnosis not present

## 2022-01-28 DIAGNOSIS — I214 Non-ST elevation (NSTEMI) myocardial infarction: Secondary | ICD-10-CM | POA: Diagnosis not present

## 2022-01-28 NOTE — Patient Instructions (Addendum)
I will do a prior authorization for Repatha. I will also check with your GI doctor ? ?I will call you once I speak with them. ? ?Please try to decrease your intake of soda and juice ? ?Please call me at [phone] with any questions ?

## 2022-01-28 NOTE — Progress Notes (Signed)
Patient ID: [name]                 DOB: September 16, 1956                    MRN: [id number] ? ? ? ? ?HPI: ?[name] is a 66 y.o. male patient referred to lipid clinic by Dr. Lavell Luster. PMH is significant for  CAD with recent NSTEMI s/p DES to ostial RCA on 12/28/2021, hypertension, hyperlipidemia, type 2 diabetes, nephrolithiasis, transaminitis, and stage I prostate cancer. LDL markedly elevated at 191 but he was not started on statin due to recent transaminitis. ? ?Patient presents today to lipid clinic. He states that his GI doctor told him he could go back on his statin. He is on atorvastatin '20mg'$  daily. Is getting blood work for GI Dr next week. Dr. Melina Copa [place]-[phone].  ? ?Patient admits sometimes he misses a dose or two of medication because he gets sick of taking medications. Blood pressure is sometimes 458 systolic and sometimes 099. His biggest complaint is that he feels tired all the time since his heart attack. No energy at all. Walks his dog about a 1/4 of a mile and feel wiped out. Works [employer] PCP yesterday who drew some labs.  ?Still gets dizzy sometimes. Has been taking carvedilol twice a day, but takes in the afternoon and then evening (6-7 hr later). I advised him to take 12 hr apart could be contributing to his dizziness and fluctuating BP.   ? ?Current Medications: atorvastatin '20mg'$  daily ?Intolerances:  ?Risk Factors: CAD, DM, LDL>190 ?LDL goal: <55 ? ?Diet: breakfast: biscuit and OJ ?Lunch: sandwich ?Dinner: sometimes eats out, sometime salmon or steak ?Drink: 8oz soda every few days, water, juice, Gatorade zero ? ?Exercise: goes to the gym a couple times a week, walks .25 miles per day ? ?Family History:  ?Family History  ?Problem Relation Age of Onset  ? Alzheimer's disease Mother   ? Heart attack Father   ? ? ?Social History: no tobacco use, couple beers per week ? ?Labs: 12/28/21 TC 272, TG 198, HDL 41, LDL-C 191 ?12/28/21 A1C 7.4 ?12/28/21 AST 152, ALT 337 ? ?Past  Medical History:  ?Diagnosis Date  ? Arthritis   ? Diabetes mellitus   ? fasting 120s  ? History of kidney stones   ? Hypertension   ? Peripheral neuropathy   ? left fifth finger, left toe  ? Pneumonia   ? hx of  ? ? ?Current Outpatient Medications on File Prior to Visit  ?Medication Sig Dispense Refill  ? amLODipine (NORVASC) 10 MG tablet Take 10 mg by mouth every morning.    ? aspirin 81 MG EC tablet Take 1 tablet (81 mg total) by mouth daily. Swallow whole. 90 tablet 3  ? carvedilol (COREG) 12.5 MG tablet Take 1 tablet (12.5 mg total) by mouth 2 (two) times daily with a meal. 60 tablet 11  ? cholecalciferol (VITAMIN D) 1000 UNITS tablet Take 1,000 Units by mouth daily.    ? citalopram (CELEXA) 20 MG tablet Take 20 mg by mouth daily.    ? Cyanocobalamin (VITAMIN B-12) 2500 MCG SUBL Place 1 tablet under the tongue daily.    ? cyclobenzaprine (FLEXERIL) 10 MG tablet TAKE 1 TABLET BY MOUTH TWICE DAILY 30 tablet 0  ? diclofenac Sodium (VOLTAREN) 1 % GEL Apply 2 g topically 4 (four) times daily. 100 g 0  ? hydrochlorothiazide (HYDRODIURIL) 25 MG tablet Take 12.5 mg by  mouth daily.    ? insulin glargine (LANTUS) 100 UNIT/ML injection Inject 30 Units into the skin at bedtime.     ? lisinopril (PRINIVIL,ZESTRIL) 40 MG tablet Take 40 mg by mouth every morning.     ? metFORMIN (GLUCOPHAGE) 500 MG tablet Take 1,000 mg by mouth 2 (two) times daily with a meal.     ? Multiple Vitamins-Minerals (MULTIVITAMINS THER. W/MINERALS) TABS Take 2 tablets by mouth daily.    ? nitroGLYCERIN (NITROSTAT) 0.4 MG SL tablet Place 1 tablet (0.4 mg total) under the tongue every 5 (five) minutes x 3 doses as needed for chest pain. 25 tablet 12  ? Omega-3 Fatty Acids (FISH OIL) 1200 MG CAPS Take 1,200 mg by mouth daily.    ? pioglitazone (ACTOS) 30 MG tablet Take 30 mg by mouth daily after lunch.    ? ticagrelor (BRILINTA) 90 MG TABS tablet Take 1 tablet (90 mg total) by mouth 2 (two) times daily. 180 tablet 3  ? ?No current facility-administered  medications on file prior to visit.  ? ? ?No Known Allergies ? ?Assessment/Plan: ? ?1. Hyperlipidemia - LDL-C is above goal of <55. We discussed PCSK9i. Reviewed injection technique. Patient states cost is not an issue. I will double check with Dr. Melina Copa to make sure he did tell patient to go back on atorvastatin and he is ok with patient being on PCSK9i. I will inform Dr. Owens Shark about pt complaint of constant fatigue. ?Recommended patient reduce/stop his intake of jucie and soda. Advised again biscuit and sausage for breakfast. ? ? ?Thank you, ? ? ?Ramond Dial, Pharm.D, BCPS, CPP ?[id number], [place], [place] [address]  ?Phone: [phone]; Fax: [phone]  ? ? ?

## 2022-02-01 ENCOUNTER — Telehealth: Payer: Self-pay | Admitting: Pharmacist

## 2022-02-01 NOTE — Telephone Encounter (Signed)
Left VM for Dr. Melina Copa and staff to confirm he told patient to resume statin and to make sure Dr. Melina Copa is ok with starting PCSK9i therapy. ?

## 2022-02-02 ENCOUNTER — Emergency Department (HOSPITAL_COMMUNITY): Payer: Medicare Other

## 2022-02-02 ENCOUNTER — Other Ambulatory Visit: Payer: Self-pay

## 2022-02-02 ENCOUNTER — Inpatient Hospital Stay (HOSPITAL_COMMUNITY)
Admission: EM | Admit: 2022-02-02 | Discharge: 2022-02-08 | DRG: 545 | Disposition: A | Discharge status: Rehabilitation Facility | Payer: Medicare Other | Attending: Internal Medicine | Admitting: Internal Medicine

## 2022-02-02 ENCOUNTER — Encounter (HOSPITAL_COMMUNITY): Payer: Self-pay | Admitting: Emergency Medicine

## 2022-02-02 DIAGNOSIS — I6350 Cerebral infarction due to unspecified occlusion or stenosis of unspecified cerebral artery: Secondary | ICD-10-CM | POA: Diagnosis not present

## 2022-02-02 DIAGNOSIS — E876 Hypokalemia: Secondary | ICD-10-CM | POA: Diagnosis present

## 2022-02-02 DIAGNOSIS — Z7902 Long term (current) use of antithrombotics/antiplatelets: Secondary | ICD-10-CM | POA: Diagnosis not present

## 2022-02-02 DIAGNOSIS — I6320 Cerebral infarction due to unspecified occlusion or stenosis of unspecified precerebral arteries: Secondary | ICD-10-CM | POA: Diagnosis present

## 2022-02-02 DIAGNOSIS — S2231XA Fracture of one rib, right side, initial encounter for closed fracture: Secondary | ICD-10-CM | POA: Diagnosis not present

## 2022-02-02 DIAGNOSIS — T380X5A Adverse effect of glucocorticoids and synthetic analogues, initial encounter: Secondary | ICD-10-CM | POA: Diagnosis not present

## 2022-02-02 DIAGNOSIS — I672 Cerebral atherosclerosis: Secondary | ICD-10-CM | POA: Diagnosis present

## 2022-02-02 DIAGNOSIS — M4802 Spinal stenosis, cervical region: Secondary | ICD-10-CM | POA: Diagnosis not present

## 2022-02-02 DIAGNOSIS — E119 Type 2 diabetes mellitus without complications: Secondary | ICD-10-CM

## 2022-02-02 DIAGNOSIS — M1990 Unspecified osteoarthritis, unspecified site: Secondary | ICD-10-CM | POA: Diagnosis not present

## 2022-02-02 DIAGNOSIS — I2510 Atherosclerotic heart disease of native coronary artery without angina pectoris: Secondary | ICD-10-CM | POA: Diagnosis not present

## 2022-02-02 DIAGNOSIS — R7989 Other specified abnormal findings of blood chemistry: Secondary | ICD-10-CM | POA: Diagnosis present

## 2022-02-02 DIAGNOSIS — Z8249 Family history of ischemic heart disease and other diseases of the circulatory system: Secondary | ICD-10-CM

## 2022-02-02 DIAGNOSIS — I10 Essential (primary) hypertension: Secondary | ICD-10-CM | POA: Diagnosis present

## 2022-02-02 DIAGNOSIS — Z820 Family history of epilepsy and other diseases of the nervous system: Secondary | ICD-10-CM | POA: Diagnosis not present

## 2022-02-02 DIAGNOSIS — K760 Fatty (change of) liver, not elsewhere classified: Secondary | ICD-10-CM | POA: Diagnosis not present

## 2022-02-02 DIAGNOSIS — E785 Hyperlipidemia, unspecified: Secondary | ICD-10-CM | POA: Diagnosis not present

## 2022-02-02 DIAGNOSIS — G7249 Other inflammatory and immune myopathies, not elsewhere classified: Secondary | ICD-10-CM | POA: Diagnosis not present

## 2022-02-02 DIAGNOSIS — I6389 Other cerebral infarction: Secondary | ICD-10-CM | POA: Diagnosis not present

## 2022-02-02 DIAGNOSIS — I6523 Occlusion and stenosis of bilateral carotid arteries: Secondary | ICD-10-CM | POA: Diagnosis not present

## 2022-02-02 DIAGNOSIS — R079 Chest pain, unspecified: Secondary | ICD-10-CM | POA: Diagnosis not present

## 2022-02-02 DIAGNOSIS — R531 Weakness: Secondary | ICD-10-CM

## 2022-02-02 DIAGNOSIS — Z96651 Presence of right artificial knee joint: Secondary | ICD-10-CM | POA: Diagnosis present

## 2022-02-02 DIAGNOSIS — R29704 NIHSS score 4: Secondary | ICD-10-CM | POA: Diagnosis not present

## 2022-02-02 DIAGNOSIS — R911 Solitary pulmonary nodule: Secondary | ICD-10-CM | POA: Diagnosis present

## 2022-02-02 DIAGNOSIS — M3320 Polymyositis, organ involvement unspecified: Secondary | ICD-10-CM | POA: Diagnosis not present

## 2022-02-02 DIAGNOSIS — R5381 Other malaise: Secondary | ICD-10-CM | POA: Diagnosis not present

## 2022-02-02 DIAGNOSIS — Z7982 Long term (current) use of aspirin: Secondary | ICD-10-CM | POA: Diagnosis not present

## 2022-02-02 DIAGNOSIS — Z79899 Other long term (current) drug therapy: Secondary | ICD-10-CM | POA: Diagnosis not present

## 2022-02-02 DIAGNOSIS — R748 Abnormal levels of other serum enzymes: Secondary | ICD-10-CM | POA: Diagnosis not present

## 2022-02-02 DIAGNOSIS — Z7984 Long term (current) use of oral hypoglycemic drugs: Secondary | ICD-10-CM

## 2022-02-02 DIAGNOSIS — Z794 Long term (current) use of insulin: Secondary | ICD-10-CM | POA: Diagnosis not present

## 2022-02-02 DIAGNOSIS — M6281 Muscle weakness (generalized): Secondary | ICD-10-CM | POA: Diagnosis not present

## 2022-02-02 DIAGNOSIS — H9193 Unspecified hearing loss, bilateral: Secondary | ICD-10-CM | POA: Diagnosis not present

## 2022-02-02 DIAGNOSIS — I252 Old myocardial infarction: Secondary | ICD-10-CM

## 2022-02-02 DIAGNOSIS — Z955 Presence of coronary angioplasty implant and graft: Secondary | ICD-10-CM | POA: Diagnosis not present

## 2022-02-02 DIAGNOSIS — E11649 Type 2 diabetes mellitus with hypoglycemia without coma: Secondary | ICD-10-CM | POA: Diagnosis not present

## 2022-02-02 DIAGNOSIS — I25810 Atherosclerosis of coronary artery bypass graft(s) without angina pectoris: Secondary | ICD-10-CM | POA: Diagnosis not present

## 2022-02-02 DIAGNOSIS — M4316 Spondylolisthesis, lumbar region: Secondary | ICD-10-CM | POA: Diagnosis not present

## 2022-02-02 DIAGNOSIS — I639 Cerebral infarction, unspecified: Secondary | ICD-10-CM | POA: Diagnosis not present

## 2022-02-02 DIAGNOSIS — M609 Myositis, unspecified: Secondary | ICD-10-CM | POA: Diagnosis not present

## 2022-02-02 DIAGNOSIS — E1142 Type 2 diabetes mellitus with diabetic polyneuropathy: Secondary | ICD-10-CM | POA: Diagnosis not present

## 2022-02-02 DIAGNOSIS — I358 Other nonrheumatic aortic valve disorders: Secondary | ICD-10-CM | POA: Diagnosis not present

## 2022-02-02 DIAGNOSIS — I6329 Cerebral infarction due to unspecified occlusion or stenosis of other precerebral arteries: Secondary | ICD-10-CM | POA: Diagnosis not present

## 2022-02-02 DIAGNOSIS — M40204 Unspecified kyphosis, thoracic region: Secondary | ICD-10-CM | POA: Diagnosis not present

## 2022-02-02 DIAGNOSIS — E1165 Type 2 diabetes mellitus with hyperglycemia: Secondary | ICD-10-CM | POA: Diagnosis not present

## 2022-02-02 DIAGNOSIS — R29898 Other symptoms and signs involving the musculoskeletal system: Secondary | ICD-10-CM | POA: Diagnosis not present

## 2022-02-02 DIAGNOSIS — I6623 Occlusion and stenosis of bilateral posterior cerebral arteries: Secondary | ICD-10-CM | POA: Diagnosis not present

## 2022-02-02 DIAGNOSIS — D72829 Elevated white blood cell count, unspecified: Secondary | ICD-10-CM | POA: Diagnosis not present

## 2022-02-02 LAB — CBC WITH DIFFERENTIAL/PLATELET
Abs Immature Granulocytes: 0.07 10*3/uL (ref 0.00–0.07)
Basophils Absolute: 0 10*3/uL (ref 0.0–0.1)
Basophils Relative: 0 %
Eosinophils Absolute: 0.2 10*3/uL (ref 0.0–0.5)
Eosinophils Relative: 2 %
HCT: 42.5 % (ref 39.0–52.0)
Hemoglobin: 14.2 g/dL (ref 13.0–17.0)
Immature Granulocytes: 1 %
Lymphocytes Relative: 31 %
Lymphs Abs: 3.2 10*3/uL (ref 0.7–4.0)
MCH: 32.1 pg (ref 26.0–34.0)
MCHC: 33.4 g/dL (ref 30.0–36.0)
MCV: 96.2 fL (ref 80.0–100.0)
Monocytes Absolute: 0.8 10*3/uL (ref 0.1–1.0)
Monocytes Relative: 7 %
Neutro Abs: 6.1 10*3/uL (ref 1.7–7.7)
Neutrophils Relative %: 59 %
Platelets: 321 10*3/uL (ref 150–400)
RBC: 4.42 MIL/uL (ref 4.22–5.81)
RDW: 13.3 % (ref 11.5–15.5)
WBC: 10.4 10*3/uL (ref 4.0–10.5)
nRBC: 0 % (ref 0.0–0.2)

## 2022-02-02 LAB — COMPREHENSIVE METABOLIC PANEL
ALT: 385 U/L — ABNORMAL HIGH (ref 0–44)
AST: 181 U/L — ABNORMAL HIGH (ref 15–41)
Albumin: 4.1 g/dL (ref 3.5–5.0)
Alkaline Phosphatase: 66 U/L (ref 38–126)
Anion gap: 10 (ref 5–15)
BUN: 16 mg/dL (ref 8–23)
CO2: 24 mmol/L (ref 22–32)
Calcium: 9.6 mg/dL (ref 8.9–10.3)
Chloride: 103 mmol/L (ref 98–111)
Creatinine, Ser: 0.46 mg/dL — ABNORMAL LOW (ref 0.61–1.24)
GFR, Estimated: >60 mL/min (ref >60)
Glucose, Bld: 274 mg/dL — ABNORMAL HIGH (ref 70–99)
Potassium: 4 mmol/L (ref 3.5–5.1)
Sodium: 137 mmol/L (ref 135–145)
Total Bilirubin: 0.7 mg/dL (ref 0.3–1.2)
Total Protein: 7.1 g/dL (ref 6.5–8.1)

## 2022-02-02 LAB — URINALYSIS, ROUTINE W REFLEX MICROSCOPIC
Bilirubin Urine: NEGATIVE
Glucose, UA: 50 mg/dL — AB
Hgb urine dipstick: NEGATIVE
Ketones, ur: NEGATIVE mg/dL
Leukocytes,Ua: NEGATIVE
Nitrite: NEGATIVE
Protein, ur: NEGATIVE mg/dL
Specific Gravity, Urine: >1.046 — ABNORMAL HIGH (ref 1.005–1.030)
pH: 5 (ref 5.0–8.0)

## 2022-02-02 LAB — CK: Total CK: 7291 U/L — ABNORMAL HIGH (ref 49–397)

## 2022-02-02 LAB — TROPONIN I (HIGH SENSITIVITY)
Troponin I (High Sensitivity): 28 ng/L — ABNORMAL HIGH (ref <18)
Troponin I (High Sensitivity): 29 ng/L — ABNORMAL HIGH (ref <18)

## 2022-02-02 LAB — SEDIMENTATION RATE: Sed Rate: 20 mm/hr — ABNORMAL HIGH (ref 0–16)

## 2022-02-02 LAB — C-REACTIVE PROTEIN: CRP: 1.4 mg/dL — ABNORMAL HIGH (ref <1.0)

## 2022-02-02 IMAGING — DX DG CHEST 2V
2 series · 2 of 2 positions shown · non-contrast
Comparison: [DATE]

CLINICAL DATA: Chest pain.  Arm weakness.

EXAM:
CHEST - 2 VIEW

[chest pa]
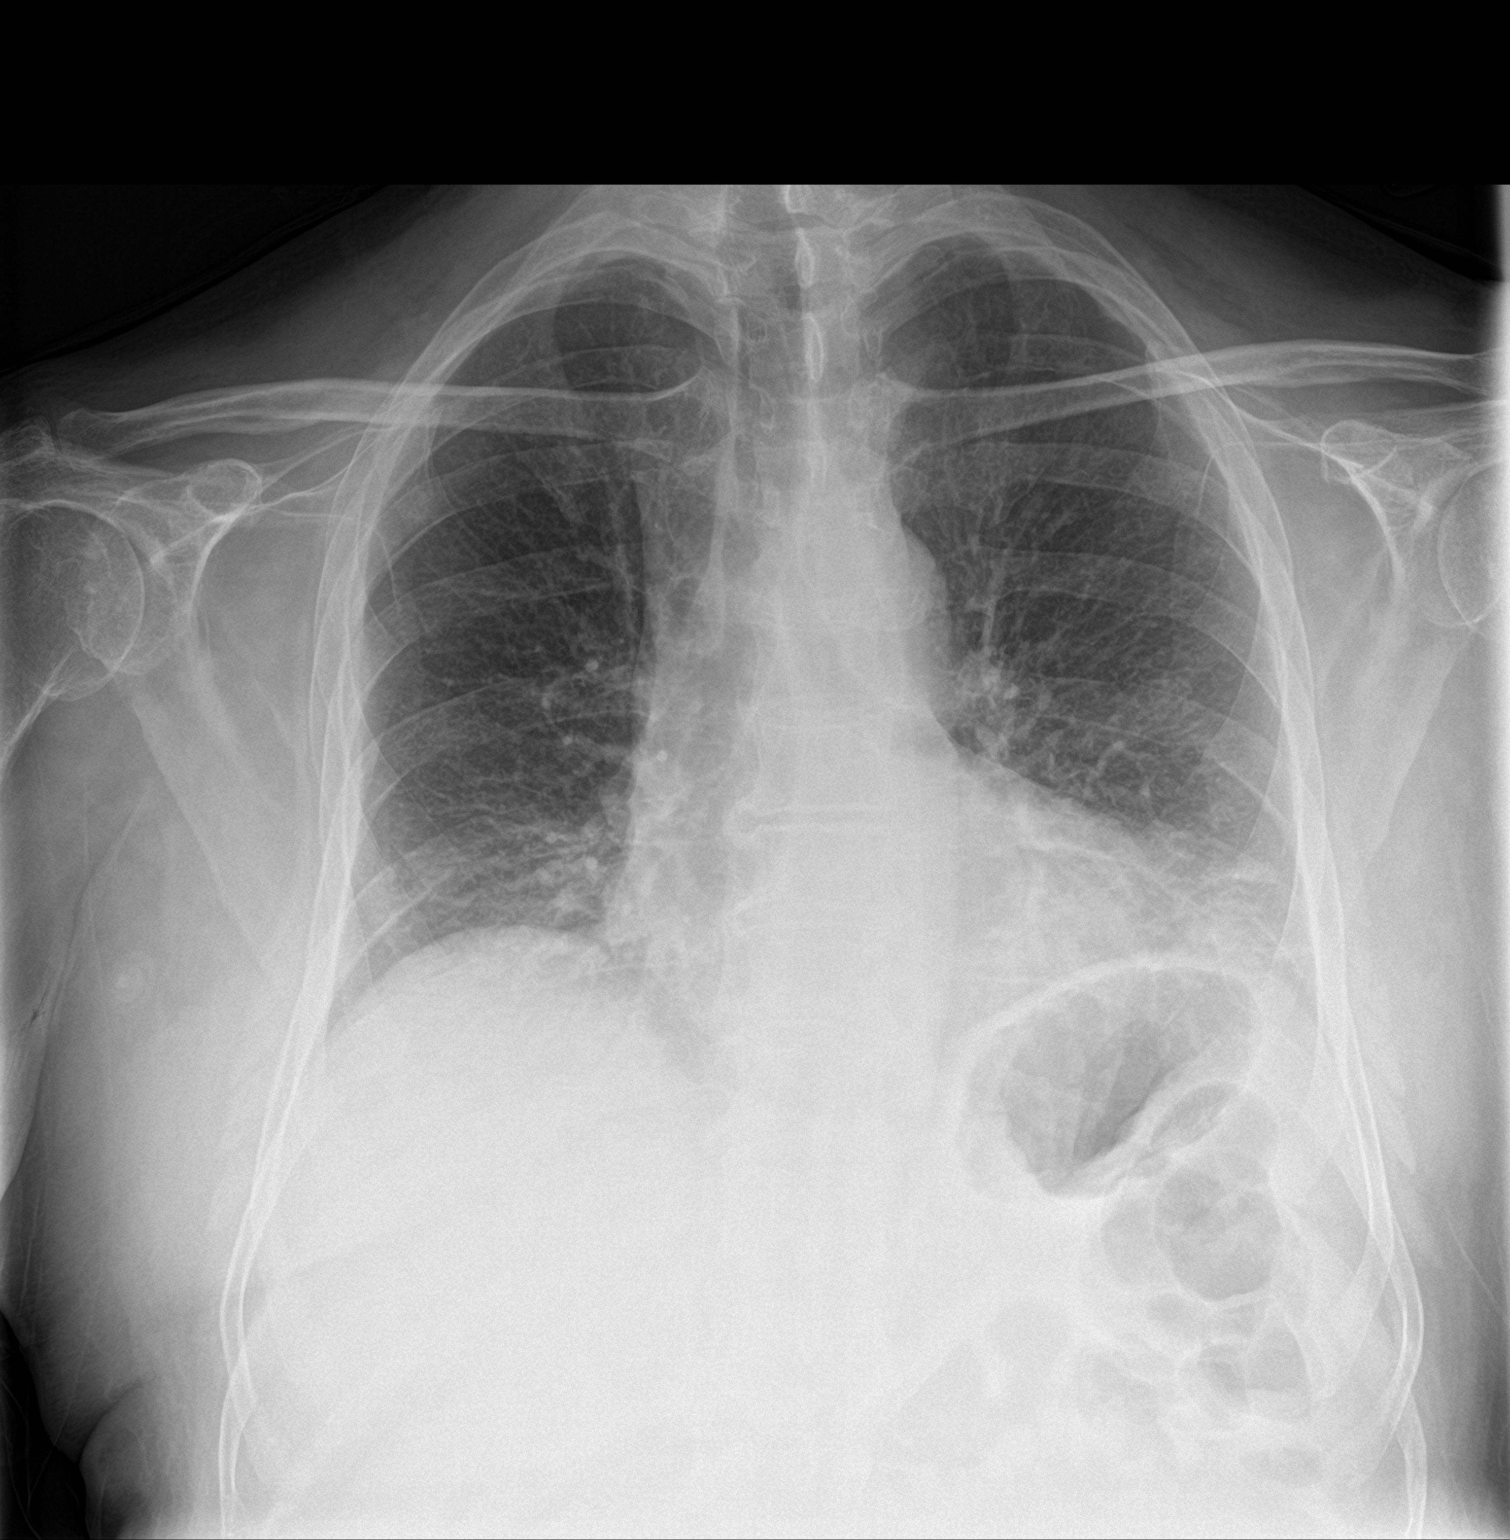

[chest lat]
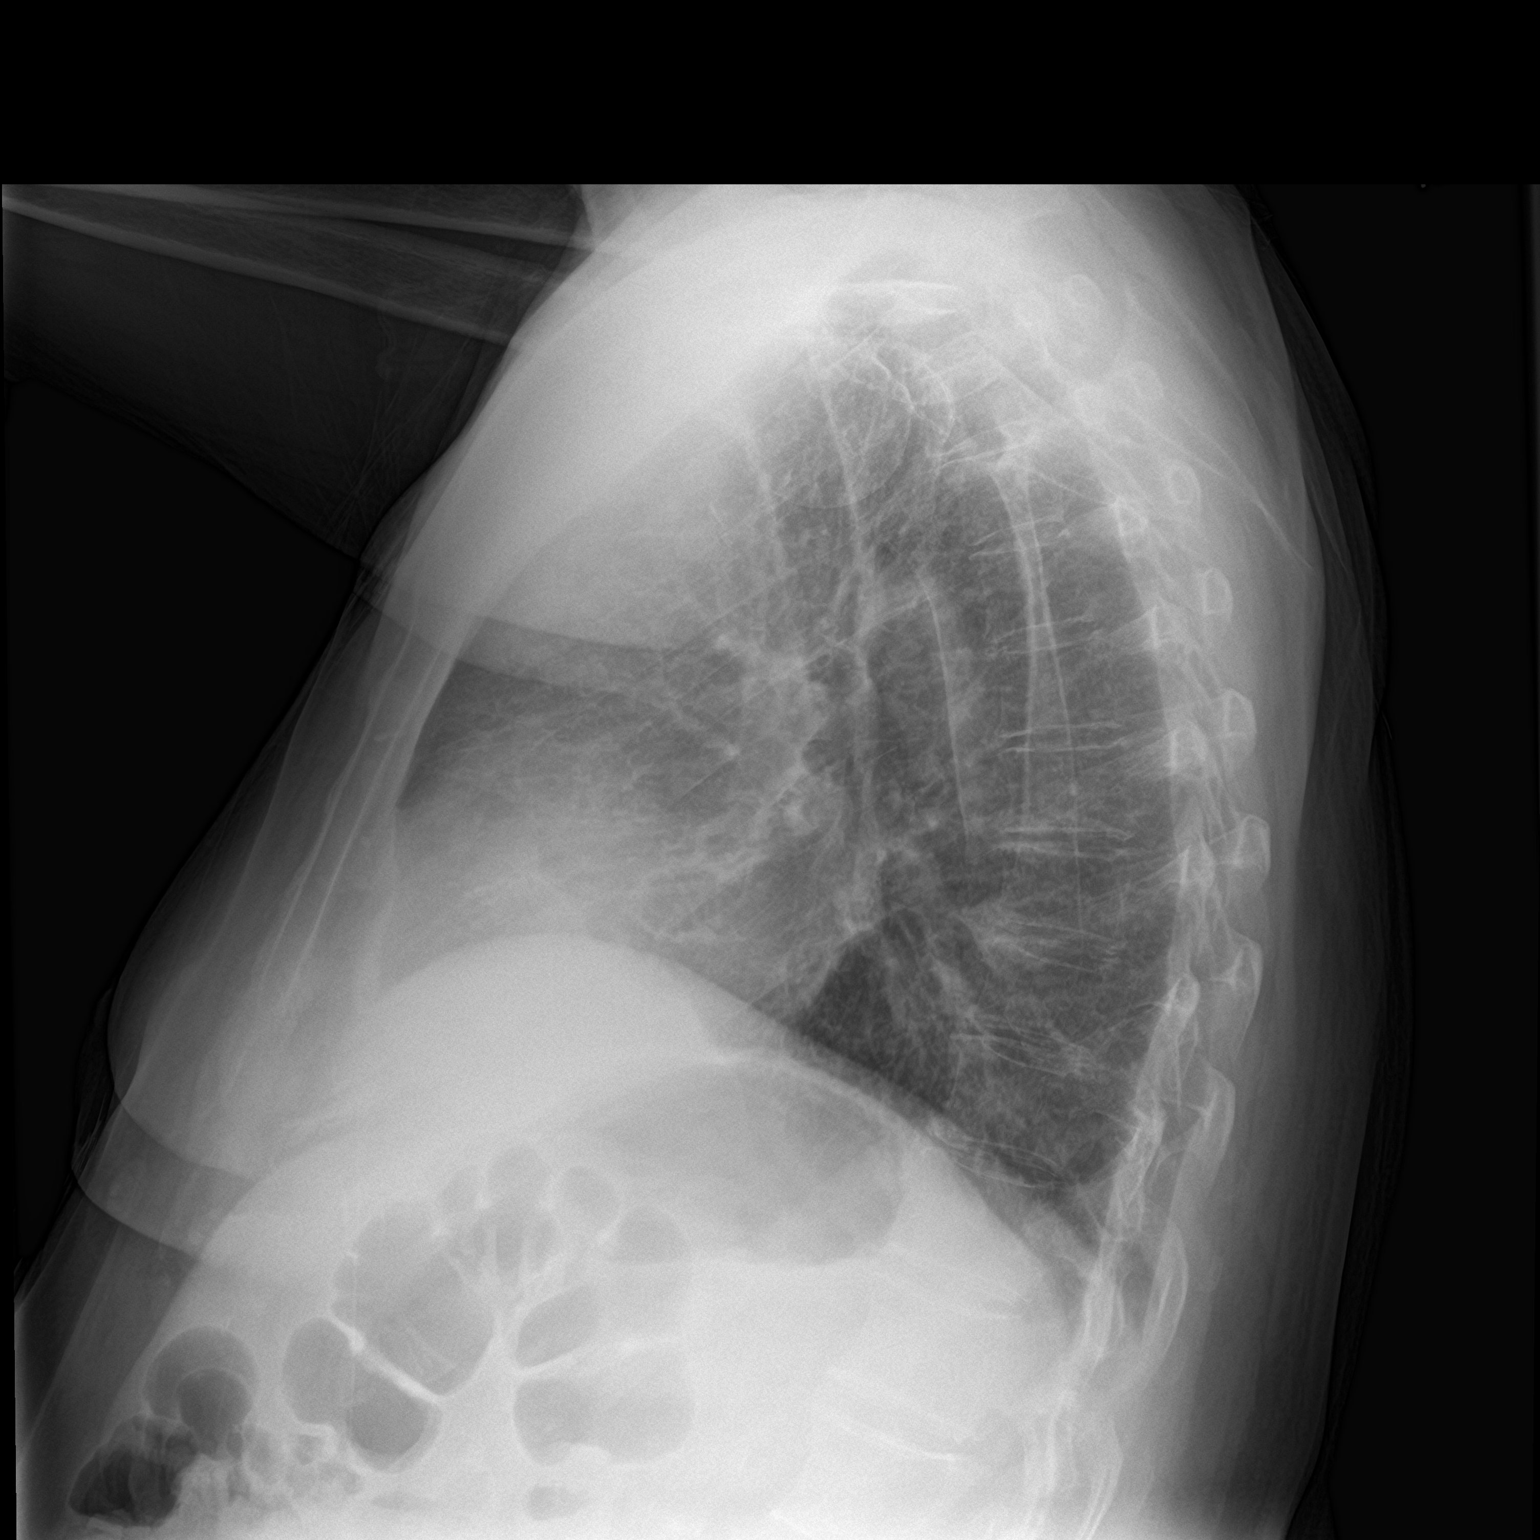

[2 of 2 positions shown; findings below may reference images not displayed]

FINDINGS: Heart size upper limits of normal. Aortic atherosclerosis. Chronic
interstitial lung markings. No sign active infiltrate, collapse or
effusion. Ordinary mild degenerative changes affect the spine.
IMPRESSION: No active disease.  Chronic lung markings.  Aortic atherosclerosis.

## 2022-02-02 IMAGING — CT CT CHEST-ABD-PELV W/ CM
2 of 5 series · 13 of 36 positions shown, 15 images · IV contrast (agent unspecified)
Comparison: CT abdomen pelvis [DATE], CT chest [DATE]

CLINICAL DATA: Polymyositis.  Metastatic disease assessment

EXAM:
CT CHEST, ABDOMEN, AND PELVIS WITH CONTRAST
TECHNIQUE: Multidetector CT imaging of the chest, abdomen and pelvis was
performed following the standard protocol during bolus
administration of intravenous contrast.

[Series 3: cap 5.0 i31f 2 · axial · 0.82mm/px · z∈[+362,+916]mm · 10 of 137 slices shown, 12 images]
[im 13/137  mediastinal]
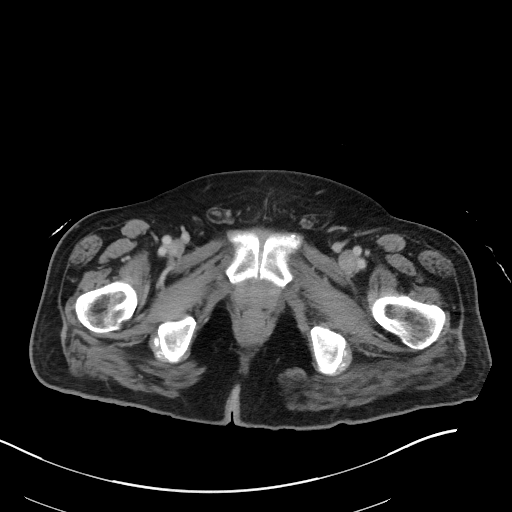
[im 13/137  bone]
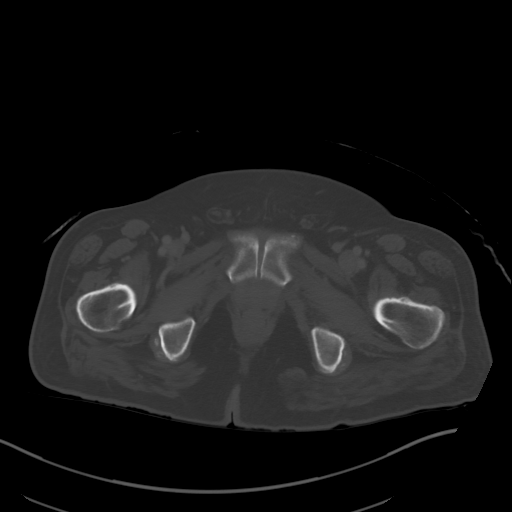
[im 25/137  mediastinal]
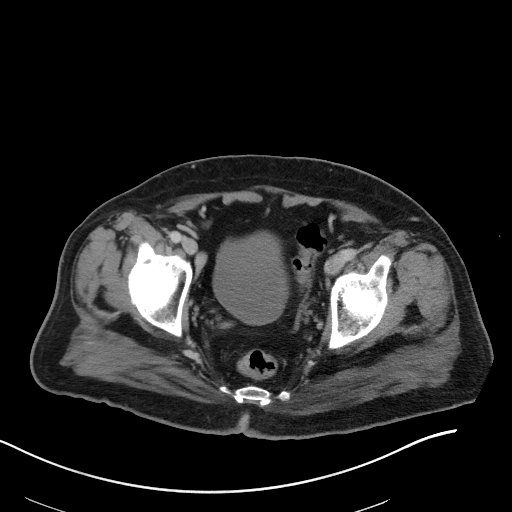
[im 38/137  mediastinal]
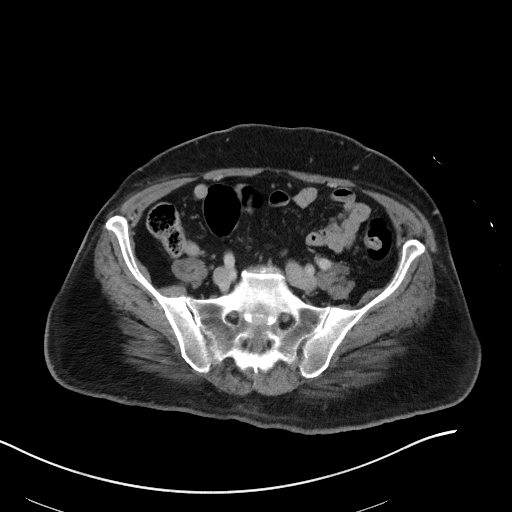
[im 50/137  mediastinal]
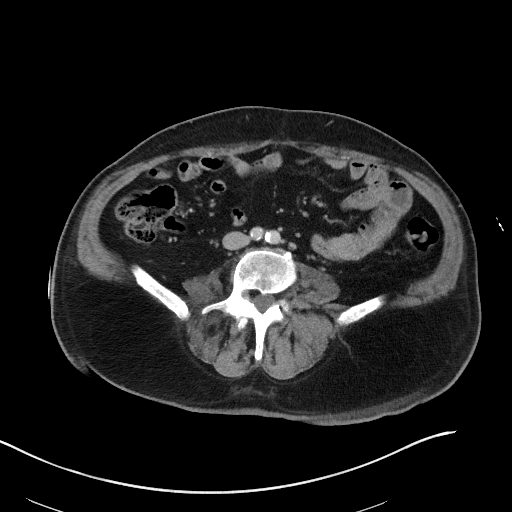
[im 62/137  mediastinal]
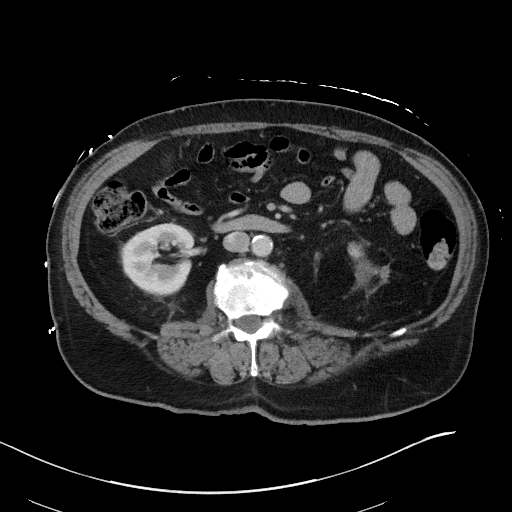
[im 75/137  mediastinal]
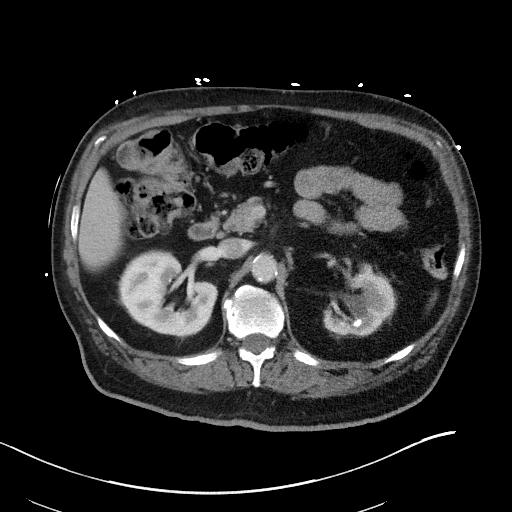
[im 87/137  mediastinal]
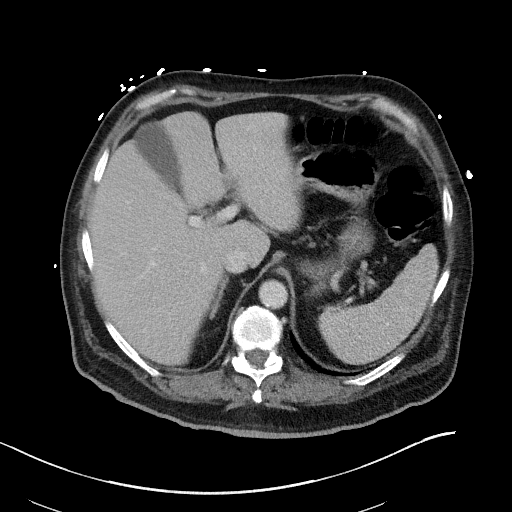
[im 99/137  mediastinal]
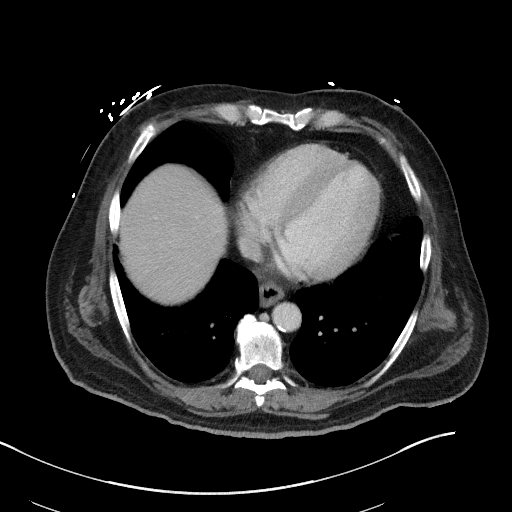
[im 112/137  mediastinal]
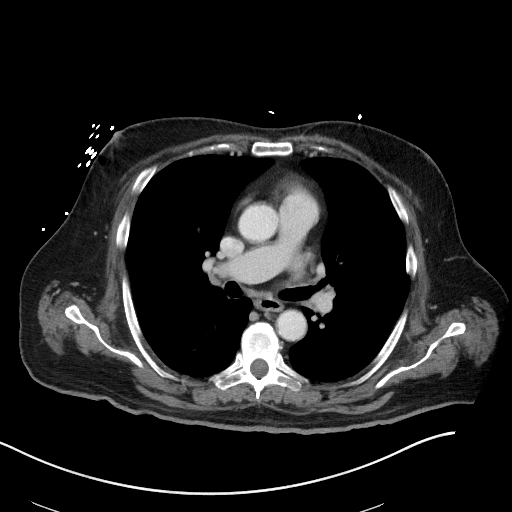
[im 112/137  bone]
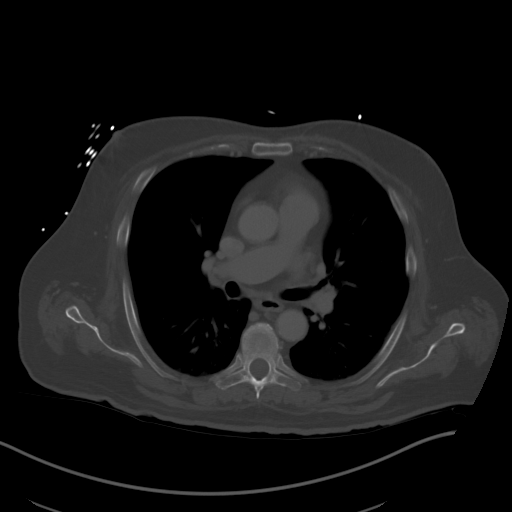
[im 124/137  mediastinal]
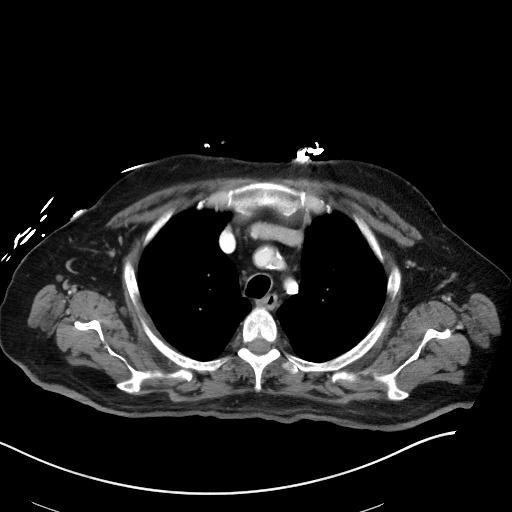

[Series 7: coronal · coronal · 0.77mm/px · 3 of 151 slices shown]
[im 31/151  mediastinal]
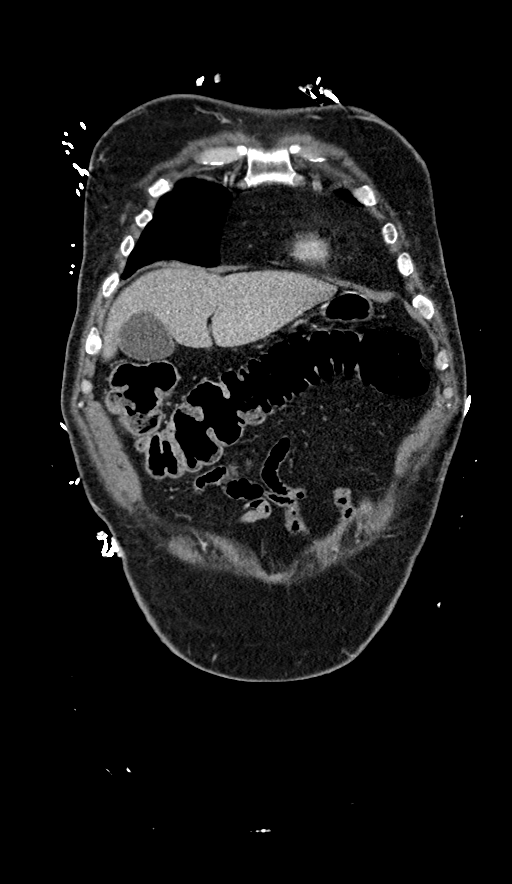
[im 61/151  mediastinal]
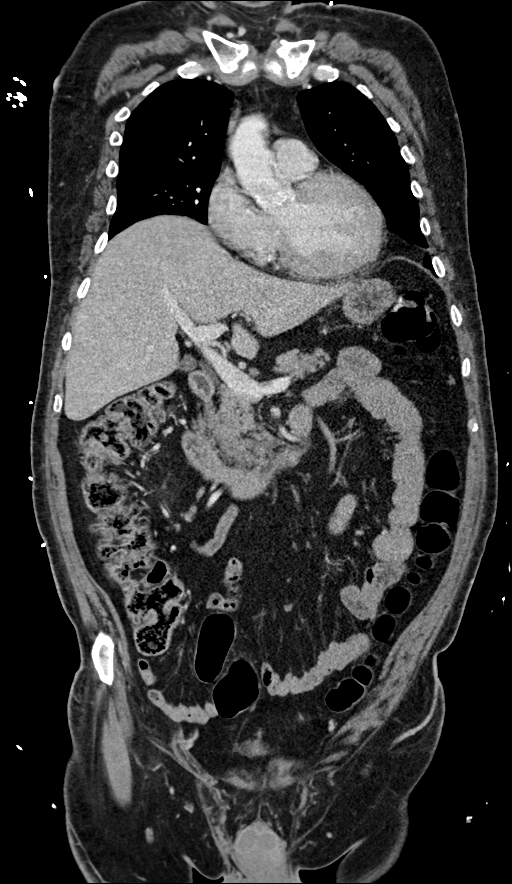
[im 91/151  mediastinal]
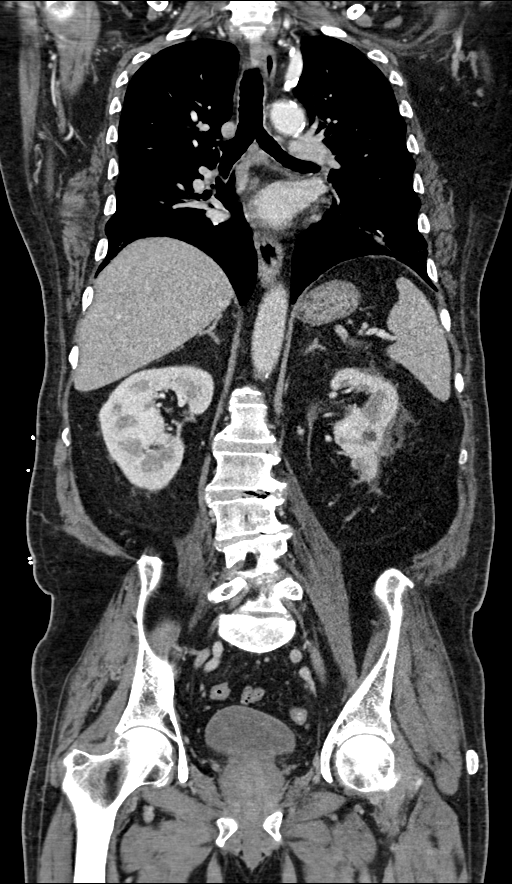

[13 of 36 positions shown; findings below may reference images not displayed]

RADIATION DOSE REDUCTION: This exam was performed according to the
departmental dose-optimization program which includes automated
exposure control, adjustment of the mA and/or kV according to
patient size and/or use of iterative reconstruction technique.

CONTRAST:  100mL OMNIPAQUE IOHEXOL 300 MG/ML  SOLN
FINDINGS: CT CHEST FINDINGS

Cardiovascular: Extensive coronary artery calcification,
asymmetrically more severe within the proximal right coronary
artery. Aortic valve leaflet calcification noted. Global cardiac
size is within normal limits. No pericardial effusion. Central
pulmonary arteries are of normal caliber. Mild atherosclerotic
calcification within the thoracic aorta. No aortic aneurysm.

Mediastinum/Nodes: No enlarged mediastinal, hilar, or axillary lymph
nodes. Thyroid gland, trachea, and esophagus demonstrate no
significant findings.

Lungs/Pleura: 6 mm noncalcified subpleural pulmonary nodule within
the right upper lobe, axial image # 66/4 appears stable since prior
examination and is safely considered benign. Lungs are otherwise
clear. No pneumothorax or pleural effusion. Central airways are
widely patent.

Musculoskeletal: Remote right fifth rib fracture anteriorly. No
acute bone abnormality. No lytic or blastic bone lesion.

CT ABDOMEN PELVIS FINDINGS

Hepatobiliary: No focal liver abnormality is seen. No gallstones,
gallbladder wall thickening, or biliary dilatation.

Pancreas: Unremarkable

Spleen: Unremarkable

Adrenals/Urinary Tract: The adrenal glands are unremarkable. The
kidneys are normal in position. Right kidney is normal in size. No
hydronephrosis on the right. No intrarenal or ureteral calculi on
the right. There is moderate left renal cortical atrophy,
asymmetrically involving the lower pole, similar to prior
examination. No hydronephrosis. Stone burden has decreased,
particularly inferiorly though multiple residual nonobstructing
calculi are identified measuring up to 10 mm within the lower pole
of the left kidney. Trace perinephric fluid on the left noted,
possibly the residua of prior intervention. No ureteral calculi.
Bladder unremarkable.

Stomach/Bowel: The stomach, small bowel, and large bowel are
unremarkable. Appendix absent. No free intraperitoneal gas or fluid.

Vascular/Lymphatic: Aortic atherosclerosis. No enlarged abdominal or
pelvic lymph nodes.

Reproductive: Prostate is unremarkable.

Other: No abdominal wall hernia

Musculoskeletal: No acute bone abnormality. No lytic or blastic bone
lesion.
IMPRESSION: No evidence of metastatic disease within the chest, abdomen, and
pelvis. No acute intrathoracic or intra-abdominal pathology
identified.

Extensive coronary artery calcification. Aortic valve leaflet
calcification noted. Echocardiography may be more helpful to assess
the degree of valvular dysfunction.

6 mm right upper lobe pulmonary nodule, stable since remote prior
examination and safely considered benign.

Moderate nonobstructing left nephrolithiasis with decreased stone
burden since prior examination of [DATE]. Moderate cortical
atrophy. Small left perinephric fluid collection, possibly the
residua of prior intervention.

Aortic Atherosclerosis ([I5]-[I5]).

## 2022-02-02 MED ORDER — INSULIN GLARGINE-YFGN 100 UNIT/ML ~~LOC~~ SOLN
10.0000 [IU] | Freq: Every day | SUBCUTANEOUS | Status: DC
Start: 2022-02-03 — End: 2022-02-06
  Administered 2022-02-03 – 2022-02-05 (×3): 10 [IU] via SUBCUTANEOUS
  Filled 2022-02-02 (×4): qty 0.1

## 2022-02-02 MED ORDER — IOHEXOL 300 MG/ML  SOLN
100.0000 mL | Freq: Once | INTRAMUSCULAR | Status: AC | PRN
  Administered 2022-02-02: 100 mL via INTRAVENOUS

## 2022-02-02 MED ORDER — CARVEDILOL 12.5 MG PO TABS
12.5000 mg | ORAL_TABLET | Freq: Two times a day (BID) | ORAL | Status: DC
  Administered 2022-02-03 – 2022-02-08 (×11): 12.5 mg via ORAL
  Filled 2022-02-02 (×11): qty 1

## 2022-02-02 MED ORDER — SENNOSIDES-DOCUSATE SODIUM 8.6-50 MG PO TABS: 1.0000 | ORAL_TABLET | Freq: Every evening | ORAL | Status: DC | PRN

## 2022-02-02 MED ORDER — ONDANSETRON HCL 4 MG/2ML IJ SOLN
4.0000 mg | Freq: Four times a day (QID) | INTRAMUSCULAR | Status: DC | PRN
Start: 2022-02-02 — End: 2022-02-08

## 2022-02-02 MED ORDER — HYDROCHLOROTHIAZIDE 12.5 MG PO TABS
12.5000 mg | ORAL_TABLET | Freq: Every day | ORAL | Status: DC
  Administered 2022-02-03: 12.5 mg via ORAL
  Filled 2022-02-02: qty 1

## 2022-02-02 MED ORDER — INSULIN ASPART 100 UNIT/ML IJ SOLN
0.0000 [IU] | Freq: Three times a day (TID) | INTRAMUSCULAR | Status: DC
  Administered 2022-02-03 (×2): 1 [IU] via SUBCUTANEOUS
  Administered 2022-02-04 (×2): 2 [IU] via SUBCUTANEOUS
  Administered 2022-02-05 (×2): 1 [IU] via SUBCUTANEOUS
  Administered 2022-02-05: 3 [IU] via SUBCUTANEOUS

## 2022-02-02 MED ORDER — ONDANSETRON HCL 4 MG PO TABS: 4.0000 mg | ORAL_TABLET | Freq: Four times a day (QID) | ORAL | Status: DC | PRN

## 2022-02-02 MED ORDER — REPATHA SURECLICK 140 MG/ML ~~LOC~~ SOAJ: 1.0000 "pen " | SUBCUTANEOUS | 11 refills | Status: DC

## 2022-02-02 MED ORDER — LACTATED RINGERS IV BOLUS
1000.0000 mL | Freq: Once | INTRAVENOUS | Status: AC
  Administered 2022-02-02: 1000 mL via INTRAVENOUS

## 2022-02-02 MED ORDER — INSULIN ASPART 100 UNIT/ML IJ SOLN
0.0000 [IU] | Freq: Every day | INTRAMUSCULAR | Status: DC
  Administered 2022-02-03 – 2022-02-04 (×2): 2 [IU] via SUBCUTANEOUS
  Administered 2022-02-05: 4 [IU] via SUBCUTANEOUS

## 2022-02-02 MED ORDER — SODIUM CHLORIDE 0.9 % IV SOLN: INTRAVENOUS | Status: AC

## 2022-02-02 MED ORDER — ENOXAPARIN SODIUM 40 MG/0.4ML IJ SOSY
40.0000 mg | PREFILLED_SYRINGE | INTRAMUSCULAR | Status: DC
  Administered 2022-02-03 – 2022-02-08 (×6): 40 mg via SUBCUTANEOUS
  Filled 2022-02-02 (×6): qty 0.4

## 2022-02-02 MED ORDER — TICAGRELOR 90 MG PO TABS
90.0000 mg | ORAL_TABLET | Freq: Two times a day (BID) | ORAL | Status: DC
  Administered 2022-02-03 – 2022-02-08 (×12): 90 mg via ORAL
  Filled 2022-02-02 (×12): qty 1

## 2022-02-02 MED ORDER — LISINOPRIL 20 MG PO TABS
40.0000 mg | ORAL_TABLET | Freq: Every morning | ORAL | Status: DC
  Administered 2022-02-03 – 2022-02-08 (×6): 40 mg via ORAL
  Filled 2022-02-02 (×6): qty 2

## 2022-02-02 MED ORDER — ASPIRIN EC 81 MG PO TBEC
81.0000 mg | DELAYED_RELEASE_TABLET | Freq: Every day | ORAL | Status: DC
  Administered 2022-02-03 – 2022-02-08 (×6): 81 mg via ORAL
  Filled 2022-02-02 (×6): qty 1

## 2022-02-02 MED ORDER — AMLODIPINE BESYLATE 10 MG PO TABS
10.0000 mg | ORAL_TABLET | Freq: Every morning | ORAL | Status: DC
Start: 2022-02-03 — End: 2022-02-08
  Administered 2022-02-03 – 2022-02-08 (×6): 10 mg via ORAL
  Filled 2022-02-02: qty 1
  Filled 2022-02-02: qty 2
  Filled 2022-02-02 (×4): qty 1

## 2022-02-02 NOTE — Assessment & Plan Note (Signed)
Elevated CK ? ?

## 2022-02-02 NOTE — ED Provider Notes (Signed)
?[facility] ?Provider Note ? ?CSN: [id number] ?Arrival date & time: 02/02/22 1636 ? ?Chief Complaint(s) ?Chest Pain ? ?HPI ?[name] is a 66 y.o. male with PMH T2DM, ACS status post MI and DES placement who presents emergency department for evaluation of weakness, weight loss and abnormal labs.  Patient states that over the last 1 year he has had a progressive decline in his ability to walk and move his legs.  He states that over the last 1 month it has significantly worsened compared to normal.  He states that he was on a statin, then taken off of this medication but had a heart attack and thus they placed it back on this medication.  He was then called by his primary care physician to come off of his medication today due to an elevated CK.  He states that he has had difficulty walking and when standing still he has difficulty raising his arms up to his head.  He denies chest pain, shortness of breath, abdominal pain, nausea, vomiting or other systemic symptoms.  He endorses a 30 pound weight loss over the course of this year. ? ? ?Past Medical History ?Past Medical History:  ?Diagnosis Date  ? Arthritis   ? Diabetes mellitus   ? fasting 120s  ? History of kidney stones   ? Hypertension   ? Peripheral neuropathy   ? left fifth finger, left toe  ? Pneumonia   ? hx of  ? ?Patient Active Problem List  ? Diagnosis Date Noted  ? Hyperlipidemia 01/28/2022  ? NSTEMI (non-ST elevated myocardial infarction) ([facility]) 12/27/2021  ? Arthritis of knee 09/02/2014  ? 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 Nephrolithiasis 02/01/2014  ? Pneumonia 08/27/2011  ? Diabetes mellitus 08/27/2011  ? ?Home Medication(s) ?Prior to Admission medications   ?Medication Sig Start Date End Date Taking? Authorizing Provider  ?amLODipine (NORVASC) 10 MG tablet Take 10 mg by mouth every morning.    [provider]  ?aspirin 81 MG EC tablet Take 1 tablet (81 mg total) by mouth daily. Swallow whole.  12/30/21   Leanor Kail, PA  ?carvedilol (COREG) 12.5 MG tablet Take 1 tablet (12.5 mg total) by mouth 2 (two) times daily with a meal. 12/29/21   Bhagat, Bhavinkumar, PA  ?cholecalciferol (VITAMIN D) 1000 UNITS tablet Take 1,000 Units by mouth daily.    [provider]  ?citalopram (CELEXA) 20 MG tablet Take 20 mg by mouth daily.    [provider]  ?Cyanocobalamin (VITAMIN B-12) 2500 MCG SUBL Place 1 tablet under the tongue daily.    [provider]  ?cyclobenzaprine (FLEXERIL) 10 MG tablet TAKE 1 TABLET BY MOUTH TWICE DAILY 07/10/17   Meredith Pel, MD  ?diclofenac Sodium (VOLTAREN) 1 % GEL Apply 2 g topically 4 (four) times daily. 01/26/21   Quintella Reichert, MD  ?Evolocumab [facility] SURECLICK) 938 MG/ML SOAJ Inject 1 pen. into the skin every 14 (fourteen) days. 02/02/22   Werner Lean, MD  ?hydrochlorothiazide (HYDRODIURIL) 25 MG tablet Take 12.5 mg by mouth daily. 10/20/20   [provider]  ?insulin glargine (LANTUS) 100 UNIT/ML injection Inject 30 Units into the skin at bedtime.     [provider]  ?lisinopril (PRINIVIL,ZESTRIL) 40 MG tablet Take 40 mg by mouth every morning.     [provider]  ?metFORMIN (GLUCOPHAGE) 500 MG tablet Take 1,000 mg by mouth 2 (two) times daily with a meal.     [provider]  ?  Multiple Vitamins-Minerals (MULTIVITAMINS THER. W/MINERALS) TABS Take 2 tablets by mouth daily.    [provider]  ?nitroGLYCERIN (NITROSTAT) 0.4 MG SL tablet Place 1 tablet (0.4 mg total) under the tongue every 5 (five) minutes x 3 doses as needed for chest pain. 12/29/21   Leanor Kail, PA  ?Omega-3 Fatty Acids (FISH OIL) 1200 MG CAPS Take 1,200 mg by mouth daily.    [provider]  ?pioglitazone (ACTOS) 30 MG tablet Take 30 mg by mouth daily after lunch.    [provider]  ?ticagrelor (BRILINTA) 90 MG TABS tablet Take 1 tablet (90 mg total) by mouth 2 (two) times daily. 12/29/21   Leanor Kail, PA  ?                                                                                                                                  ?Past Surgical History ?Past Surgical History:  ?Procedure Laterality Date  ? APPENDECTOMY    ? CORONARY STENT INTERVENTION N/A 12/28/2021  ? Procedure: CORONARY STENT INTERVENTION;  Surgeon: Jettie Booze, MD;  Location: [facility];  Service: Cardiovascular;  Laterality: N/A;  ? CYSTOSCOPY W/ URETERAL STENT PLACEMENT Bilateral 03/17/2014  ? Procedure: CYSTOSCOPY WITH RIGHT STENT PULL  ;  Surgeon: Alexis Frock, MD;  Location: [facility];  Service: Urology;  Laterality: Bilateral;  ? CYSTOSCOPY WITH RETROGRADE PYELOGRAM, URETEROSCOPY AND STENT PLACEMENT Bilateral 02/01/2014  ? Procedure: CYSTOSCOPY WITH BILATERAL RETROGRADE CYSTOSCOPY, BILATERAL RETROGRADE, PYELOGRAM, RIGHT URETEROSCOPY, STONE BASKETRY,  AND BILATERAL STENT PLACEMENT;  Surgeon: Alexis Frock, MD;  Location: [facility];  Service: Urology;  Laterality: Bilateral;  ? CYSTOSCOPY WITH URETEROSCOPY AND STENT PLACEMENT Left 02/12/2021  ? Procedure: CYSTOSCOPY WITH LEFT URETEROSCOPY, BALLOON DILITATION, HOLMIUM LASER LITHOTRIPSY AND STENT PLACEMENT;  Surgeon: Lucas Mallow, MD;  Location: [facility];  Service: Urology;  Laterality: Left;  ? HOLMIUM LASER APPLICATION Left 8/92/1194  ? Procedure: HOLMIUM LASER APPLICATION;  Surgeon: Alexis Frock, MD;  Location: [facility];  Service: Urology;  Laterality: Left;  ? INTRAVASCULAR ULTRASOUND/IVUS N/A 12/28/2021  ? Procedure: Intravascular Ultrasound/IVUS;  Surgeon: Jettie Booze, MD;  Location: [facility];  Service: Cardiovascular;  Laterality: N/A;  ? LEFT HEART CATH AND CORONARY ANGIOGRAPHY N/A 12/28/2021  ? Procedure: LEFT HEART CATH AND CORONARY ANGIOGRAPHY;  Surgeon: Jettie Booze, MD;  Location: [facility];  Service: Cardiovascular;  Laterality: N/A;  ? LITHOTRIPSY    ? NEPHROLITHOTOMY Left 03/17/2014  ? Procedure: NEPHROLITHOTOMY  PERCUTANEOUS with access;  Surgeon: Alexis Frock, MD;  Location: [facility];  Service: Urology;  Laterality: Left;  ? NEPHROLITHOTOMY Left 03/19/2014  ? Procedure: 2ND STAGE NEPHROLITHOTOMY PERCUTANEOUS left ureteroscopy with lasr stone treatment and stent placement, with access;  Surgeon: Alexis Frock, MD;  Location: [facility];  Service: Urology;  Laterality: Left;  ? TOTAL KNEE ARTHROPLASTY Right 09/02/2014  ? Procedure: RIGHT TOTAL KNEE ARTHROPLASTY;  Surgeon:  Meredith Pel, MD;  Location: [facility];  Service: Orthopedics;  Laterality: Right;  ? uretral stent    ? ?Family History ?Family History  ?Problem Relation Age of Onset  ? Alzheimer's disease Mother   ? Heart attack Father   ? ? ?Social History ?Social History  ? ?Tobacco Use  ? Smoking status: Never  ? Smokeless tobacco: Never  ?Vaping Use  ? Vaping Use: Never used  ?Substance Use Topics  ? Alcohol use: Yes  ?  Alcohol/week: 2.0 standard drinks  ?  Types: 2 Cans of beer per week  ?  Comment: occasional/ 1-2 beers week  ? Drug use: No  ? ?Allergies ?Patient has no known allergies. ? ?Review of Systems ?Review of Systems  ?Constitutional:  Positive for unexpected weight change.  ?Neurological:  Positive for weakness.  ? ?Physical Exam ?Vital Signs  ?I have reviewed the triage vital signs ?BP (!) 182/92   Pulse 87   Temp 98.3 ?F (36.8 ?C) (Oral)   Resp (!) 25   SpO2 98%  ? ?Physical Exam ?Constitutional:   ?   General: He is not in acute distress. ?   Appearance: Normal appearance. He is ill-appearing.  ?HENT:  ?   Head: Normocephalic and atraumatic.  ?   Nose: No congestion or rhinorrhea.  ?Eyes:  ?   General:     ?   Right eye: No discharge.     ?   Left eye: No discharge.  ?   Extraocular Movements: Extraocular movements intact.  ?   Pupils: Pupils are equal, round, and reactive to light.  ?Cardiovascular:  ?   Rate and Rhythm: Normal rate and regular rhythm.  ?   Heart sounds: No murmur heard. ?Pulmonary:  ?   Effort: No respiratory distress.  ?   Breath  sounds: No wheezing or rales.  ?Abdominal:  ?   General: There is no distension.  ?   Tenderness: There is no abdominal tenderness.  ?Musculoskeletal:     ?   General: Normal range of motion.  ?   Cervical back:

## 2022-02-02 NOTE — Assessment & Plan Note (Signed)
Holding statin due to suspected myopathy. ?

## 2022-02-02 NOTE — Assessment & Plan Note (Addendum)
Elevated CK 7291 ?Patient with proximal symmetric muscle weakness bilaterally affecting all extremities suspicious for statin myopathy. ?-Avoid further statin therapy ?-Start on IV NS'@125'$  mL/hour overnight ?-Repeat CK in a.m. ?-PT/OT ?

## 2022-02-02 NOTE — Assessment & Plan Note (Signed)
Persistently elevated LFTs.  CT A/P without hepatobiliary abnormality.  Recommend outpatient follow-up. ?

## 2022-02-02 NOTE — Telephone Encounter (Signed)
Patient called back. States he is on his way to [facility] ER. Feels really week, feels like his speech is slurred, had a hard time writing legible. Father is taking him to [facility]. ?I advised that was a good idea. ?We did discuss that Repatha was approved. Request Rx be sent to [facility]. ?I asked him not to take atorvastatin until he can discuss with Dr. Melina Copa. He is supposed to see him tomorrow at [facility] ?

## 2022-02-02 NOTE — ED Provider Triage Note (Signed)
Emergency Medicine Provider Triage Evaluation Note ? ?[name] , a 66 y.o. male  was evaluated in triage.  Pt complains of intermittent chest pain.  Patient has a recent NSTEMI status post PCI.  He also complains of bilateral upper extremity and lower extremity weakness progressively worsening over the past 6 weeks.  He states he has difficulty brushing his hair, brushing his teeth, getting out of bed in the mornings.  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  He states his friend that is with him today had to pick him up to get him into the truck. ? ?Review of Systems  ?Positive: As above ?Negative: As above ? ?Physical Exam  ?BP (!) 170/92   Pulse 90   Temp 98.3 ?F (36.8 ?C) (Oral)   Resp (!) 22   SpO2 97%  ?Gen:   Awake, no distress   ?Resp:  Normal effort  ?MSK:   Generalized weakness ?Other:   ? ?Medical Decision Making  ?Medically screening exam initiated at 4:53 PM.  Appropriate orders placed.  [name] was informed that the remainder of the evaluation will be completed by another provider, this initial triage assessment does not replace that evaluation, and the importance of remaining in the ED until their evaluation is complete. ? ? ?  ?Evlyn Courier, PA-C ?02/02/22 1656 ? ?

## 2022-02-02 NOTE — H&P (Signed)
?History and Physical  ? ? ?[name]:[id number] DOB: 03-31-1956 DOA: 02/02/2022 ? ?PCP: Greig Right, Gutierrez  ?Patient coming from: Home ? ?I have personally briefly reviewed patient's old medical records in [place] ? ?Chief Complaint: Progressive extremity weakness ? ?HPI: ?[name] is a 66 y.o. male with medical history significant for CAD s/p DES to ostial RCA (12/28/2021), T2DM, HTN who presented to the ED for evaluation of progressive weakness in his extremities. ? ?Patient recently admitted 12/27/2021-12/29/2021 for NSTEMI.  He underwent cardiac catheterization on 12/28/2021 with placement of DES to ostial RCA.  He was started on DAPT with aspirin and Brilinta.  Of note he was not started on statin at the time due to elevated LFTs. ? ?Patient states that at some point he was started on atorvastatin 20 mg daily which he has been taking for about 5 weeks now.  He has noted progressive weakness involving the proximal muscles of all of his extremities during this time period.  He states he normally has been ambulating well without use of assistive devices.  He however has been having significant difficulty even getting out of bed.  He has required assistance from family to mobilize. ? ?He denies any recent chest pain, dyspnea, nausea, vomiting, abdominal pain. ? ?ED Course  Labs/Imaging on admission: I have personally reviewed following labs and imaging studies. ? ?Initial vitals showed BP 170/92, pulse 90, RR 22, temp 98.3 ?F, SPO2 97% on room air. ? ?Labs show CK 7291, CRP 1.4, ESR 20, troponin 28 > 29, AST 181, ALT 385, alk phos 66, total bilirubin 0.7, BUN 16, creatinine 0.46, serum glucose 274, sodium 137, potassium 4.0, bicarb 24. ? ?Urinalysis and urine myoglobin ordered and pending collection. ? ?2 view chest x-ray negative for focal consolidation, edema, effusion.  Chronic interstitial lung markings noted. ? ?CT chest/abdomen/pelvis with contrast negative for evidence of metastatic disease.   Extensive coronary artery calcification noted.  Stable 6 mm right upper lobe pulmonary nodule noted.  Moderate nonobstructing left nephrolithiasis seen. ? ?Patient was given 1 L LR.  Neurology were consulted.  The hospitalist service was consulted to admit for further evaluation and management. ? ?Review of Systems: All systems reviewed and are negative except as documented in history of present illness above. ? ? ?Past Medical History:  ?Diagnosis Date  ? Arthritis   ? Diabetes mellitus   ? fasting 120s  ? History of kidney stones   ? Hypertension   ? Peripheral neuropathy   ? left fifth finger, left toe  ? Pneumonia   ? hx of  ? ? ?Past Surgical History:  ?Procedure Laterality Date  ? APPENDECTOMY    ? CORONARY STENT INTERVENTION N/A 12/28/2021  ? Procedure: CORONARY STENT INTERVENTION;  Surgeon: [name];  Location: [facility];  Service: Cardiovascular;  Laterality: N/A;  ? CYSTOSCOPY W/ URETERAL STENT PLACEMENT Bilateral 03/17/2014  ? Procedure: CYSTOSCOPY WITH RIGHT STENT PULL  ;  Surgeon: [name];  Location: [facility];  Service: Urology;  Laterality: Bilateral;  ? CYSTOSCOPY WITH RETROGRADE PYELOGRAM, URETEROSCOPY AND STENT PLACEMENT Bilateral 02/01/2014  ? Procedure: CYSTOSCOPY WITH BILATERAL RETROGRADE CYSTOSCOPY, BILATERAL RETROGRADE, PYELOGRAM, RIGHT URETEROSCOPY, STONE BASKETRY,  AND BILATERAL STENT PLACEMENT;  Surgeon: [name];  Location: [facility];  Service: Urology;  Laterality: Bilateral;  ? CYSTOSCOPY WITH URETEROSCOPY AND STENT PLACEMENT Left 02/12/2021  ? Procedure: CYSTOSCOPY WITH LEFT URETEROSCOPY, BALLOON DILITATION, HOLMIUM LASER LITHOTRIPSY AND STENT PLACEMENT;  Surgeon: Rodney Loan,  Desiree Hane, Gutierrez;  Location: [facility];  Service: Urology;  Laterality: Left;  ? HOLMIUM LASER APPLICATION Left 01/10/3844  ? Procedure: HOLMIUM LASER APPLICATION;  Surgeon: [name];  Location: [facility];  Service: Urology;  Laterality: Left;  ? INTRAVASCULAR ULTRASOUND/IVUS N/A  12/28/2021  ? Procedure: Intravascular Ultrasound/IVUS;  Surgeon: [name];  Location: [facility];  Service: Cardiovascular;  Laterality: N/A;  ? LEFT HEART CATH AND CORONARY ANGIOGRAPHY N/A 12/28/2021  ? Procedure: LEFT HEART CATH AND CORONARY ANGIOGRAPHY;  Surgeon: [name];  Location: [facility];  Service: Cardiovascular;  Laterality: N/A;  ? LITHOTRIPSY    ? NEPHROLITHOTOMY Left 03/17/2014  ? Procedure: NEPHROLITHOTOMY PERCUTANEOUS with access;  Surgeon: [name];  Location: [facility];  Service: Urology;  Laterality: Left;  ? NEPHROLITHOTOMY Left 03/19/2014  ? Procedure: 2ND STAGE NEPHROLITHOTOMY PERCUTANEOUS left ureteroscopy with lasr stone treatment and stent placement, with access;  Surgeon: [name];  Location: [facility];  Service: Urology;  Laterality: Left;  ? TOTAL KNEE ARTHROPLASTY Right 09/02/2014  ? Procedure: RIGHT TOTAL KNEE ARTHROPLASTY;  Surgeon: [name];  Location: [facility];  Service: Orthopedics;  Laterality: Right;  ? uretral stent    ? ? ?Social History: ? reports that he has never smoked. He has never used smokeless tobacco. He reports current alcohol use of about 2.0 standard drinks per week. He reports that he does not use drugs. ? ?No Known Allergies ? ?Family History  ?Problem Relation Age of Onset  ? Alzheimer's disease Mother   ? Heart attack Father   ? ? ? ?Prior to Admission medications   ?Medication Sig Start Date End Date Taking? Authorizing Provider  ?amLODipine (NORVASC) 10 MG tablet Take 10 mg by mouth every morning.   Yes Provider, Historical, Gutierrez  ?aspirin 81 MG EC tablet Take 1 tablet (81 mg total) by mouth daily. Swallow whole. 12/30/21  Yes Bhagat, Bhavinkumar, PA  ?carvedilol (COREG) 12.5 MG tablet Take 1 tablet (12.5 mg total) by mouth 2 (two) times daily with a meal. 12/29/21  Yes Bhagat, Bhavinkumar, PA  ?cholecalciferol (VITAMIN D) 1000 UNITS tablet Take 1,000 Units by mouth daily.   Yes Provider, Historical,  Gutierrez  ?citalopram (CELEXA) 20 MG tablet Take 20 mg by mouth daily.   Yes Provider, Historical, Gutierrez  ?Cyanocobalamin (VITAMIN B-12) 2500 MCG SUBL Place 1 tablet under the tongue daily.   Yes Provider, Historical, Gutierrez  ?cyclobenzaprine (FLEXERIL) 10 MG tablet TAKE 1 TABLET BY MOUTH TWICE DAILY ?Patient taking differently: Take 10 mg by mouth at bedtime. 07/10/17  Yes [name]  ?diclofenac Sodium (VOLTAREN) 1 % GEL Apply 2 g topically 4 (four) times daily. ?Patient taking differently: Apply 2 g topically 4 (four) times daily as needed (knee pain). 01/26/21  Yes Quintella Reichert, Gutierrez  ?hydrochlorothiazide (HYDRODIURIL) 25 MG tablet Take 12.5 mg by mouth daily. 10/20/20  Yes Provider, Historical, Gutierrez  ?insulin glargine (LANTUS) 100 UNIT/ML injection Inject 30 Units into the skin at bedtime.    Yes Provider, Historical, Gutierrez  ?lisinopril (PRINIVIL,ZESTRIL) 40 MG tablet Take 40 mg by mouth every morning.    Yes Provider, Historical, Gutierrez  ?metFORMIN (GLUCOPHAGE) 500 MG tablet Take 1,000 mg by mouth 2 (two) times daily with a meal.    Yes Provider, Historical, Gutierrez  ?Multiple Vitamins-Minerals (MULTIVITAMINS THER. W/MINERALS) TABS Take 1 tablet by mouth daily.   Yes Provider, Historical, Gutierrez  ?nitroGLYCERIN (NITROSTAT) 0.4 MG SL  tablet Place 1 tablet (0.4 mg total) under the tongue every 5 (five) minutes x 3 doses as needed for chest pain. 12/29/21  Yes Leanor Kail, PA  ?Omega-3 Fatty Acids (FISH OIL) 1200 MG CAPS Take 1,200 mg by mouth daily.   Yes Provider, Historical, Gutierrez  ?pioglitazone (ACTOS) 30 MG tablet Take 30 mg by mouth daily after lunch.   Yes Provider, Historical, Gutierrez  ?ticagrelor (BRILINTA) 90 MG TABS tablet Take 1 tablet (90 mg total) by mouth 2 (two) times daily. 12/29/21  Yes Leanor Kail, PA  ?Evolocumab (REPATHA SURECLICK) 968 MG/ML SOAJ Inject 1 pen. into the skin every 14 (fourteen) days. 02/02/22   Werner Lean, Gutierrez  ? ? ?Physical Exam: ?Vitals:  ? 02/02/22 2100 02/02/22 2130 02/02/22  2200 02/02/22 2300  ?BP: (!) 145/89 (!) 150/78 (!) 153/80 (!) 145/82  ?Pulse: 86 84 86 93  ?Resp: (!) 23 (!) $Remo'22 18 20  'RBIcQ$ ?Temp:      ?TempSrc:      ?SpO2: 97% 95% 98% 98%  ? ?Constitutional: Resting in bed,

## 2022-02-02 NOTE — Assessment & Plan Note (Signed)
Placed on SSI and reduced insulin glargine 10 units nightly. ?

## 2022-02-02 NOTE — Telephone Encounter (Signed)
I spoke with Katharine Look from Dr. Rosanne Ashing office. She said Dr. Melina Copa wanted to know if patient was going to be taking PCSK9i with or without statin. I advised that per patient, Dr. Melina Copa said he could resume,but we wanted to make sure this was correct since his LFT were pretty elevated. Patient has apt with Dr. Melina Copa on Thursday and per Katharine Look, this will be addressed and she will send me the notes. ?Dr. Melina Copa said he has no issues with PCSK9i.  ? ?Insurance has pt name spelled as Arnette Norris ? ?PA approved through 02/03/23 ? ?I will wait till after pt appointment with GI tomorrow to call and discuss with patient. ?

## 2022-02-02 NOTE — Assessment & Plan Note (Signed)
S/p DES to ostial RCA 12/28/2021.  Denies further chest pain. ?-Continue aspirin and Brilinta ?-Continue Coreg ?

## 2022-02-02 NOTE — ED Triage Notes (Signed)
Pt presents with CP and pain and weakness in both arms.  States this has been going on a while and has mentioned this to his doctor and had blood work.  Hx of NSTEMI in April.  Pt states he is most concerned about loss of muscle and strength and has googled his symptoms and is concerned for MS or MD.   ?

## 2022-02-03 ENCOUNTER — Observation Stay (HOSPITAL_COMMUNITY): Payer: Medicare Other

## 2022-02-03 DIAGNOSIS — M6281 Muscle weakness (generalized): Secondary | ICD-10-CM

## 2022-02-03 LAB — CBC
HCT: 38.9 % — ABNORMAL LOW (ref 39.0–52.0)
Hemoglobin: 12.7 g/dL — ABNORMAL LOW (ref 13.0–17.0)
MCH: 31.6 pg (ref 26.0–34.0)
MCHC: 32.6 g/dL (ref 30.0–36.0)
MCV: 96.8 fL (ref 80.0–100.0)
Platelets: 273 10*3/uL (ref 150–400)
RBC: 4.02 MIL/uL — ABNORMAL LOW (ref 4.22–5.81)
RDW: 13.3 % (ref 11.5–15.5)
WBC: 10.8 10*3/uL — ABNORMAL HIGH (ref 4.0–10.5)
nRBC: 0 % (ref 0.0–0.2)

## 2022-02-03 LAB — COMPREHENSIVE METABOLIC PANEL
ALT: 317 U/L — ABNORMAL HIGH (ref 0–44)
AST: 140 U/L — ABNORMAL HIGH (ref 15–41)
Albumin: 3.5 g/dL (ref 3.5–5.0)
Alkaline Phosphatase: 52 U/L (ref 38–126)
Anion gap: 5 (ref 5–15)
BUN: 13 mg/dL (ref 8–23)
CO2: 26 mmol/L (ref 22–32)
Calcium: 9 mg/dL (ref 8.9–10.3)
Chloride: 108 mmol/L (ref 98–111)
Creatinine, Ser: 0.37 mg/dL — ABNORMAL LOW (ref 0.61–1.24)
GFR, Estimated: >60 mL/min (ref >60)
Glucose, Bld: 117 mg/dL — ABNORMAL HIGH (ref 70–99)
Potassium: 3.4 mmol/L — ABNORMAL LOW (ref 3.5–5.1)
Sodium: 139 mmol/L (ref 135–145)
Total Bilirubin: 0.9 mg/dL (ref 0.3–1.2)
Total Protein: 6.1 g/dL — ABNORMAL LOW (ref 6.5–8.1)

## 2022-02-03 LAB — GLUCOSE, CAPILLARY
Glucose-Capillary: 104 mg/dL — ABNORMAL HIGH (ref 70–99)
Glucose-Capillary: 187 mg/dL — ABNORMAL HIGH (ref 70–99)
Glucose-Capillary: 209 mg/dL — ABNORMAL HIGH (ref 70–99)

## 2022-02-03 LAB — CBG MONITORING, ED
Glucose-Capillary: 109 mg/dL — ABNORMAL HIGH (ref 70–99)
Glucose-Capillary: 123 mg/dL — ABNORMAL HIGH (ref 70–99)
Glucose-Capillary: 141 mg/dL — ABNORMAL HIGH (ref 70–99)

## 2022-02-03 LAB — HEMOGLOBIN A1C
Hgb A1c MFr Bld: 7.4 % — ABNORMAL HIGH (ref 4.8–5.6)
Mean Plasma Glucose: 165.68 mg/dL

## 2022-02-03 LAB — CK: Total CK: 1940 U/L — ABNORMAL HIGH (ref 49–397)

## 2022-02-03 LAB — LDL CHOLESTEROL, DIRECT: Direct LDL: 97.8 mg/dL (ref 0–99)

## 2022-02-03 IMAGING — MR MR CERVICAL SPINE W/O CM
4 of 5 series · 18 of 48 positions shown · non-contrast
Comparison: None Available.

CLINICAL DATA: Progressive symmetric proximal muscle weakness
weight loss common bilateral leg weakness

EXAM:
MRI CERVICAL SPINE WITHOUT CONTRAST
TECHNIQUE: Multiplanar, multisequence MR imaging of the cervical spine was
performed. No intravenous contrast was administered.

[Series 2: T2 · sagittal · 3.0mm · 0.43mm/px · 6 of 13 slices shown (1 of 2)]
[im 1/13]
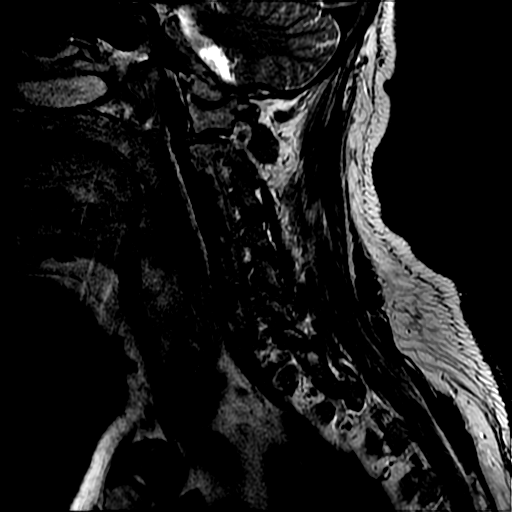
[im 3/13]
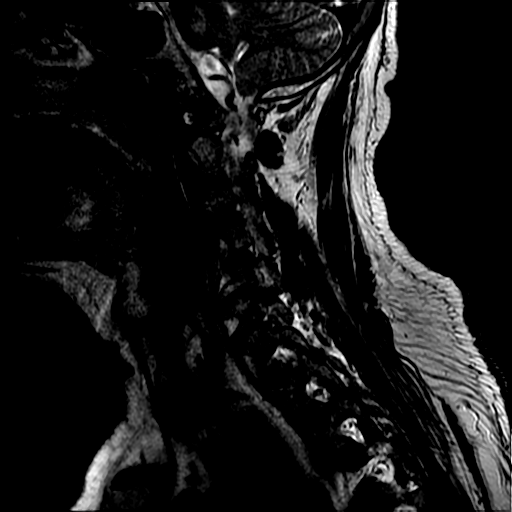
[im 5/13]
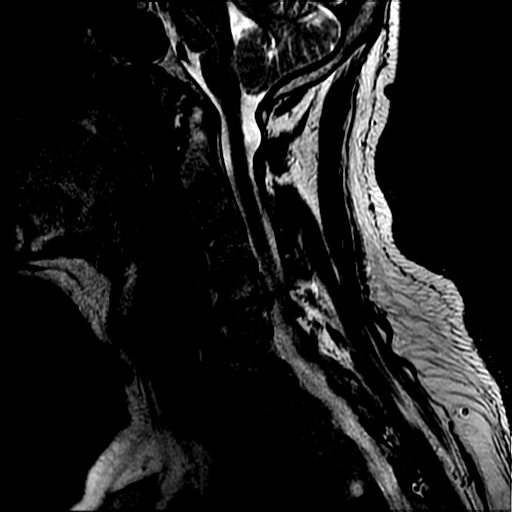
[im 8/13]
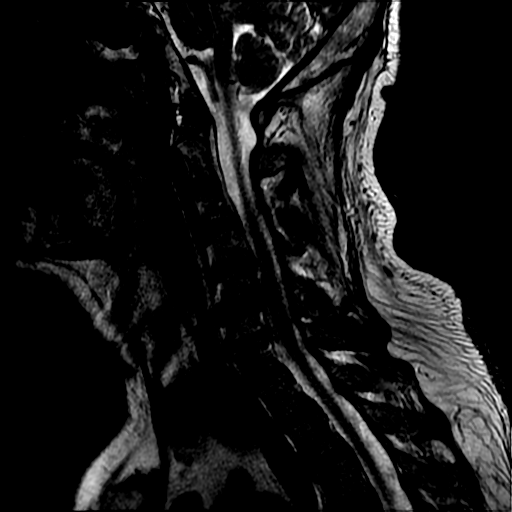
[im 10/13]
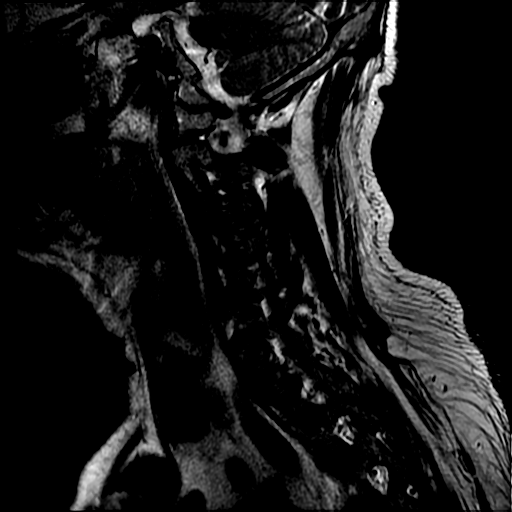
[im 13/13]
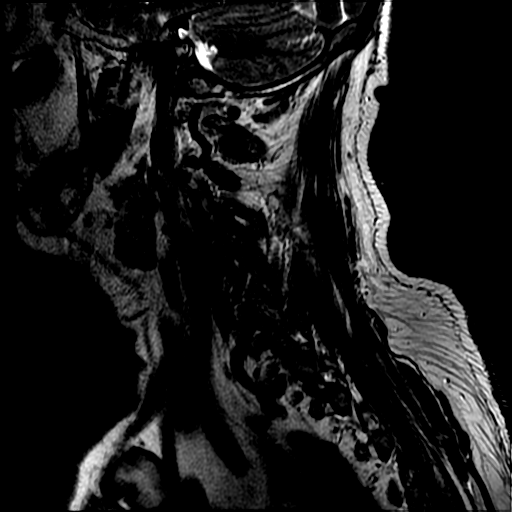

[Series 5: sag ir · sagittal · 3.0mm · 0.43mm/px · 3 of 13 slices shown]
[im 3/13]
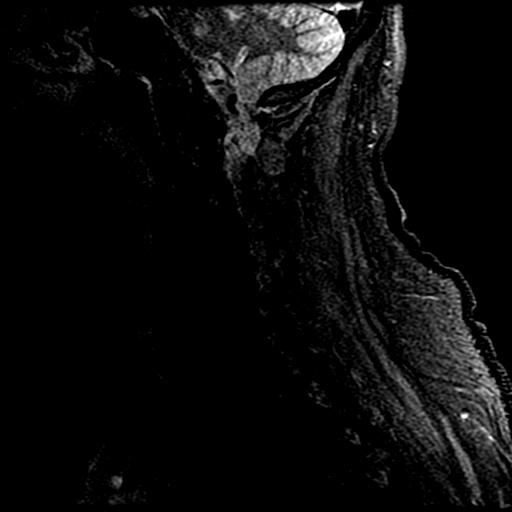
[im 8/13]
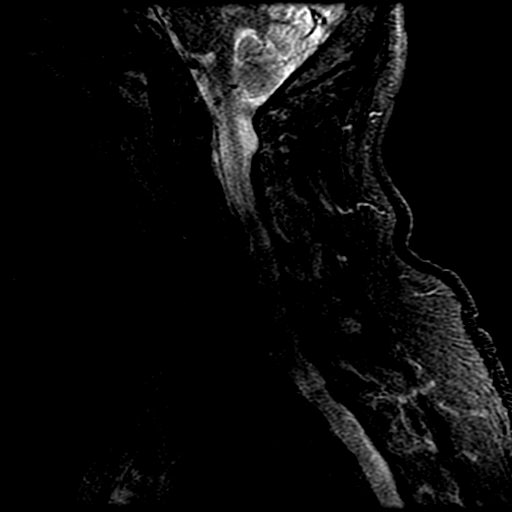
[im 13/13]
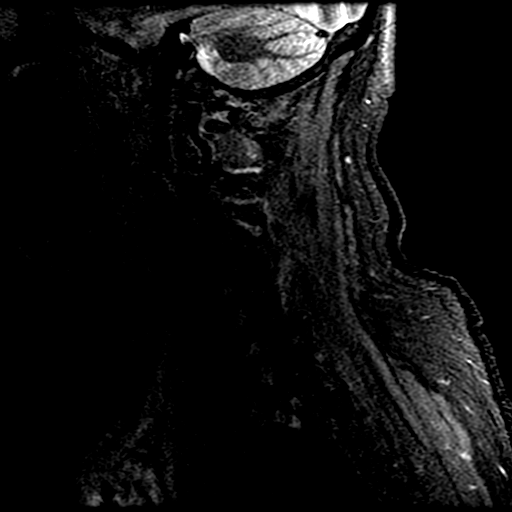

[Series 6: ax 2d merge · axial · 3.0mm · 0.39mm/px · z∈[-31,+35]mm · 3 of 30 slices shown]
[im 5/30]
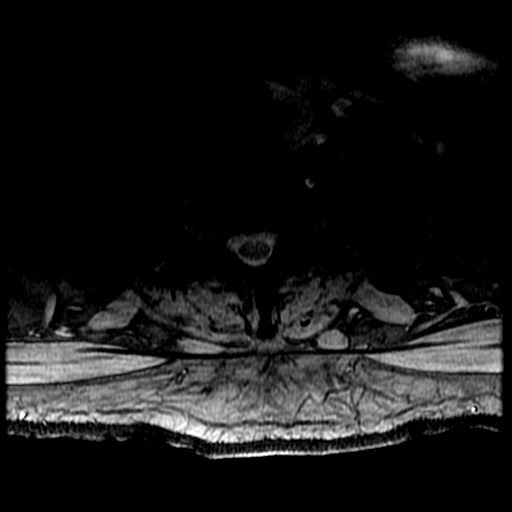
[im 15/30]
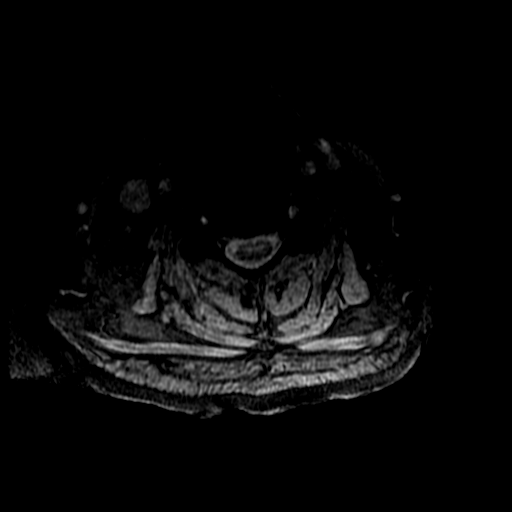
[im 25/30]
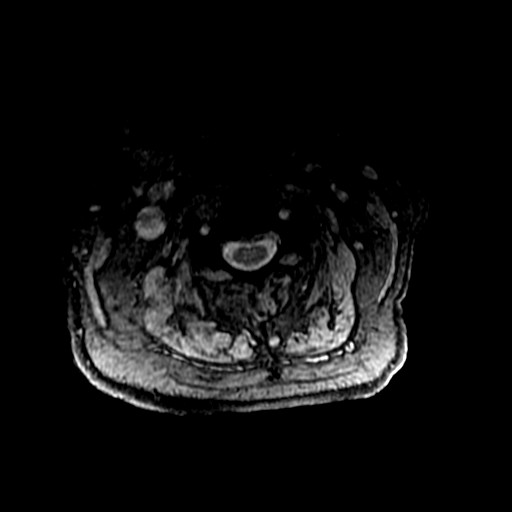

[Series 7: T2 · axial · 3.0mm · 0.39mm/px · z∈[-45,+35]mm · 6 of 30 slices shown (2 of 2)]
[im 1/30]
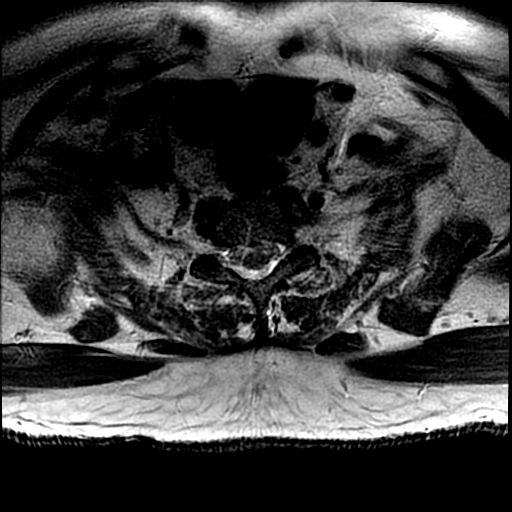
[im 5/30]
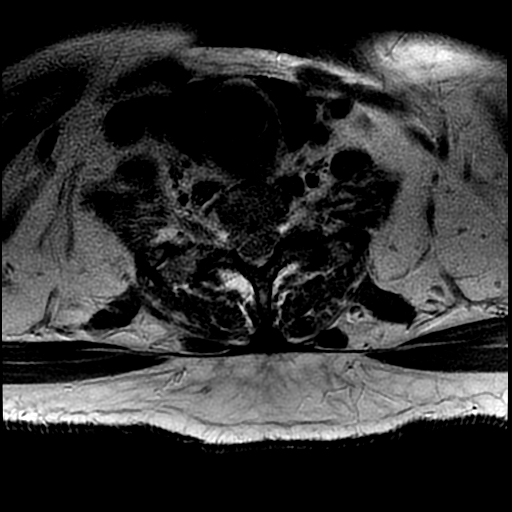
[im 9/30]
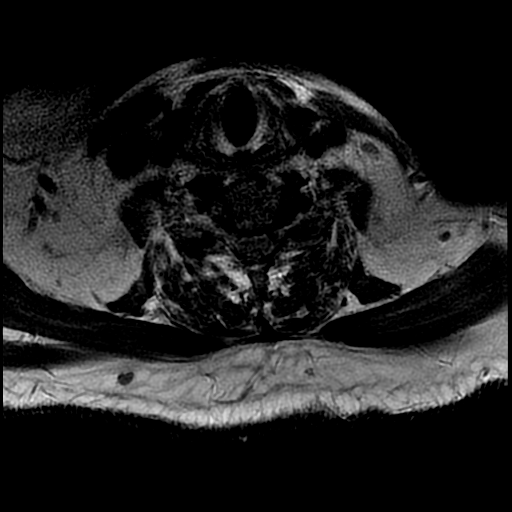
[im 13/30]
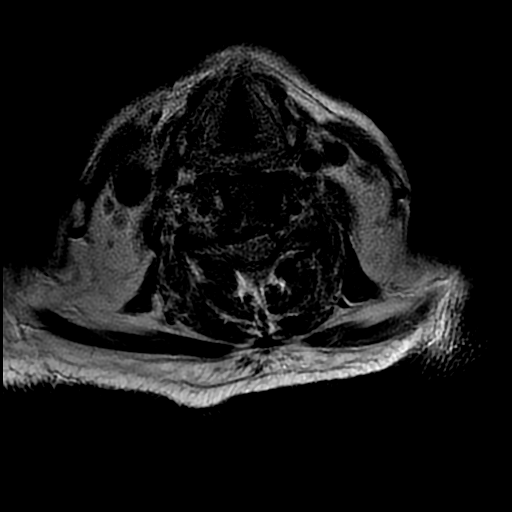
[im 15/30]
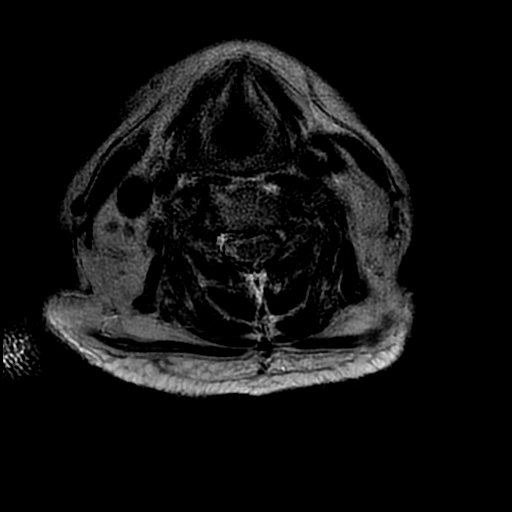
[im 25/30]
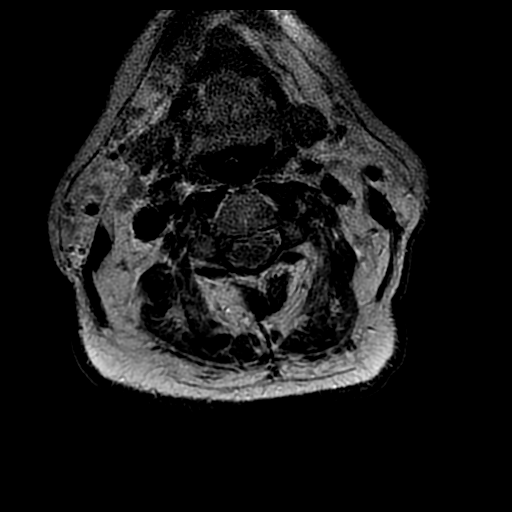

[18 of 48 positions shown; findings below may reference images not displayed]

FINDINGS: Alignment: There is straightening of the normal cervical lordosis.
There is grade 1 anterolisthesis of C3 on C4.

Vertebrae: Vertebral body heights are preserved. Background marrow
signal is normal. There is no suspicious signal abnormality or
marrow edema.

Cord: Normal in signal and morphology.

Posterior Fossa, vertebral arteries, paraspinal tissues: The
posterior fossa and right pontine infarct are assessed on the
separately dictated brain MRI. Vertebral artery flow voids are
normal. The paraspinal soft tissues are unremarkable.

Disc levels:

There is disc desiccation and narrowing most advanced at C5-C6 and
C6-C7.

The axial T2 images are motion degraded, limiting evaluation of the
foramina.

C2-C3: There is mild uncovertebral and bilateral facet arthropathy
resulting in mild right and no significant left neural foraminal
stenosis and no significant spinal canal stenosis

C3-C4: There is left worse than right uncovertebral ridging and
facet arthropathy with a small left-sided effusion resulting in
severe left and mild right neural foraminal stenosis without
significant spinal canal stenosis

C4-C5: There is mild uncovertebral and bilateral facet arthropathy
resulting in moderate left worse than right neural foraminal
stenosis without significant spinal canal stenosis

C5-C6: There is prominent right-sided uncovertebral ridging common
bilateral facet arthropathy, and a large broad-based right
paracentral osteophyte/protrusion resulting in mild narrowing of the
right aspect of the spinal canal and severe right neural foraminal
stenosis with likely impingement of the exiting C6 nerve root, and
mild-to-moderate left neural foraminal stenosis

C6-C7: There is bilateral uncovertebral ridging and facet
arthropathy resulting in severe left and moderate right neural
foraminal stenosis without significant spinal canal stenosis

C7-T1: No significant spinal canal or neural foraminal stenosis.
IMPRESSION: 1. At C3-C4, there is grade 1 anterolisthesis, left worse than right
uncovertebral ridging, and facet arthropathy with a small left
effusion resulting in severe left and mild right neural foraminal
stenosis.
2. At C5-C6, there is right worse than left uncovertebral ridging,
bilateral facet arthropathy, and a prominent right paracentral
osteophyte/protrusion resulting in severe right neural foraminal
stenosis with likely impingement of the exiting C6 nerve root, and
mild-to-moderate left neural foraminal stenosis.
3. Severe left and moderate right neural foraminal stenosis at
C6-C7.

## 2022-02-03 IMAGING — MR MR HEAD W/O CM
8 of 10 series · 36 of 48 positions shown · non-contrast
Comparison: None Available.

CLINICAL DATA: Motor neuron disease suspected

EXAM:
MRI HEAD WITHOUT CONTRAST
TECHNIQUE: Multiplanar, multiecho pulse sequences of the brain and surrounding
structures were obtained without intravenous contrast.

[Series 3: DWI · axial · 3.0mm · 1.09mm/px · z∈[-35,+103]mm · 9 of 94 slices shown (1 of 4)]
[im 1/94]
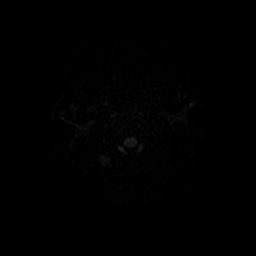
[im 12/94]
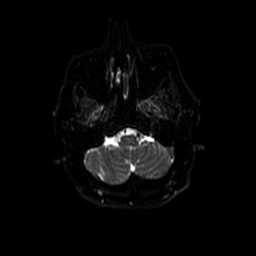
[im 24/94]
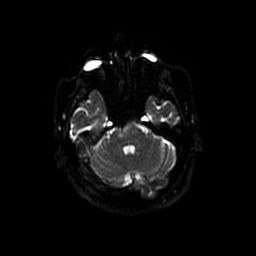
[im 35/94]
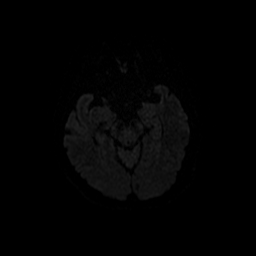
[im 47/94]
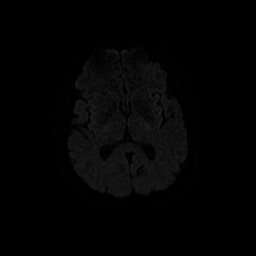
[im 59/94]
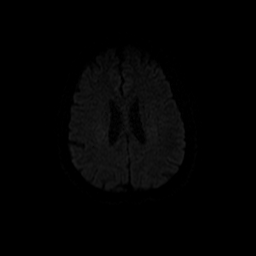
[im 70/94]
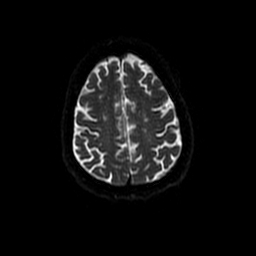
[im 82/94]
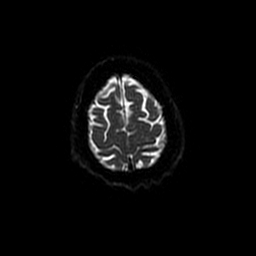
[im 94/94]
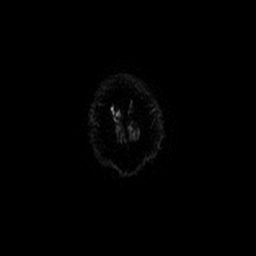

[Series 4: DWI · coronal · 5.0mm · 1.09mm/px · 7 of 68 slices shown (2 of 4)]
[im 1/68]
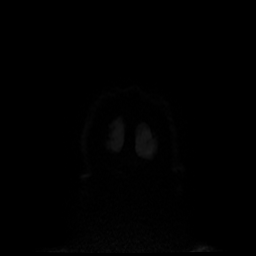
[im 12/68]
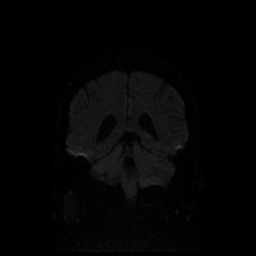
[im 23/68]
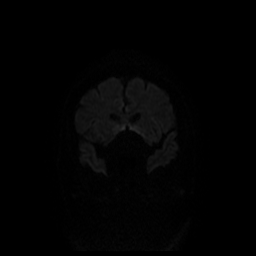
[im 34/68]
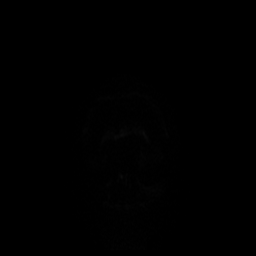
[im 45/68]
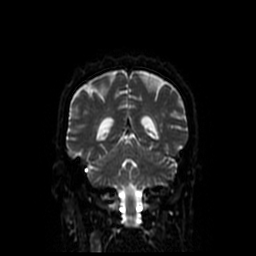
[im 56/68]
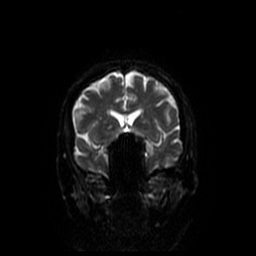
[im 68/68]
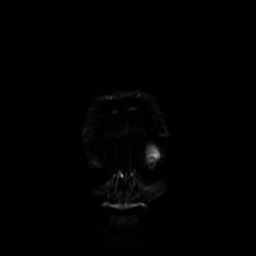

[Series 5: T1 · sagittal · 5.0mm · 0.47mm/px · 3 of 25 slices shown]
[im 1/25]
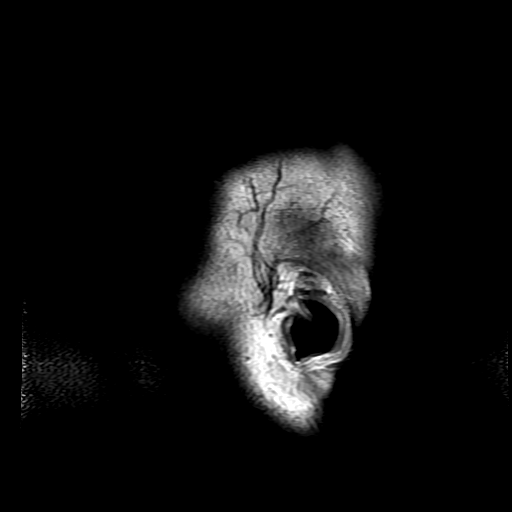
[im 13/25]
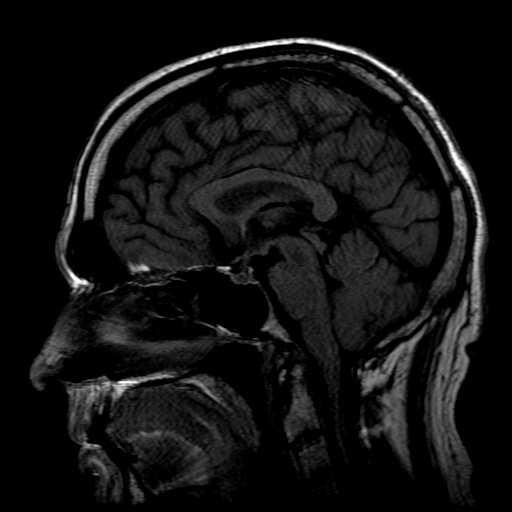
[im 25/25]
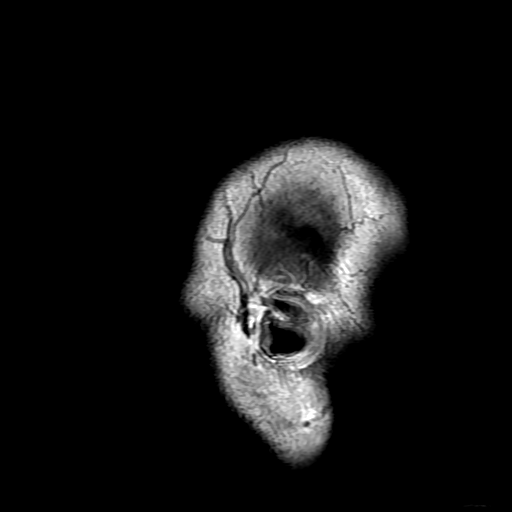

[Series 6: T2 · axial · 5.0mm · 0.43mm/px · z∈[-26,+124]mm · 3 of 26 slices shown (1 of 2)]
[im 1/26]
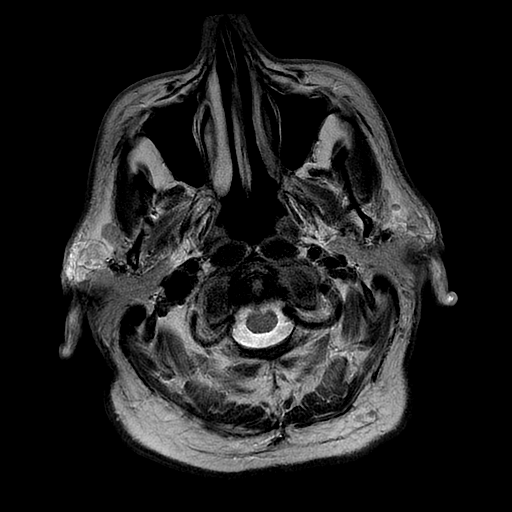
[im 13/26]
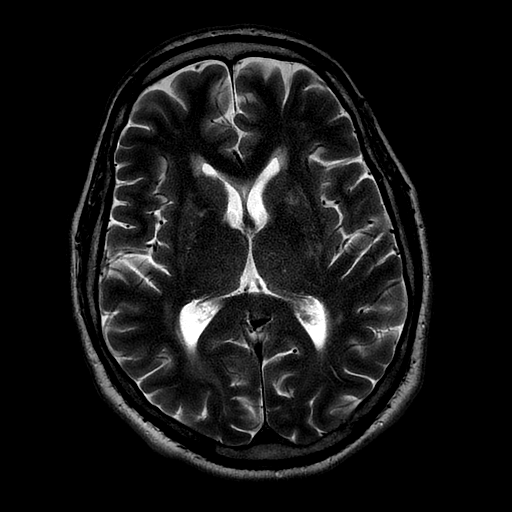
[im 26/26]
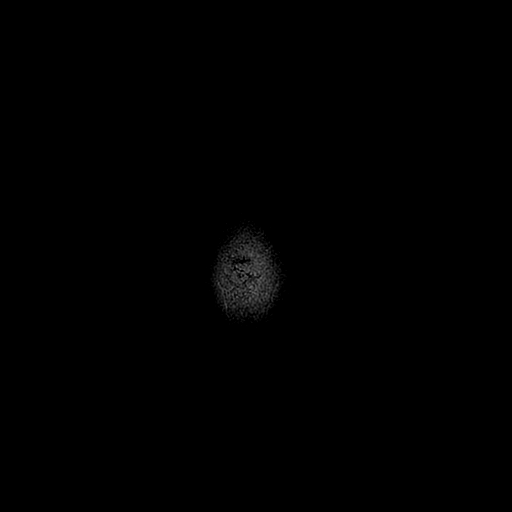

[Series 7: FLAIR · axial · 3.0mm · 0.43mm/px · z∈[-26,+124]mm · 3 of 26 slices shown]
[im 1/26]
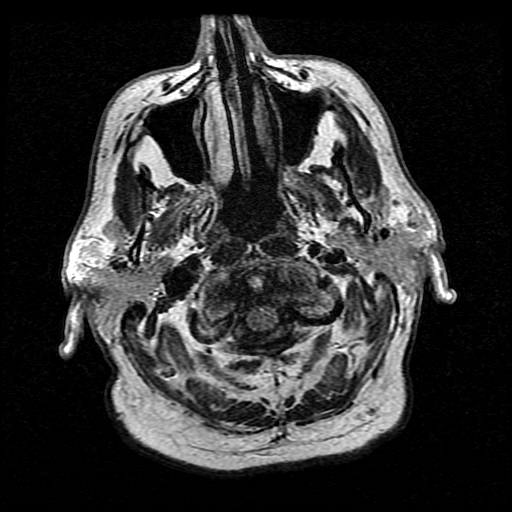
[im 13/26]
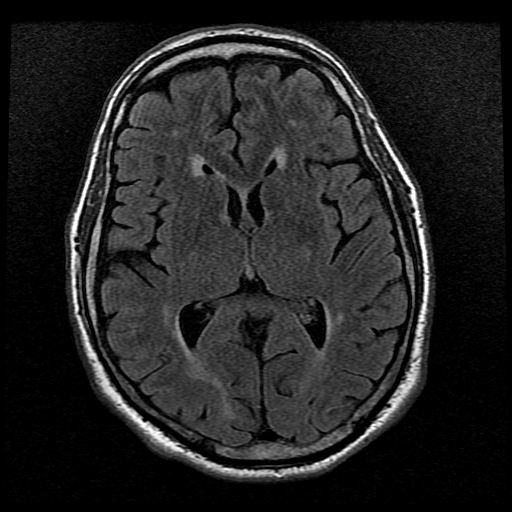
[im 26/26]
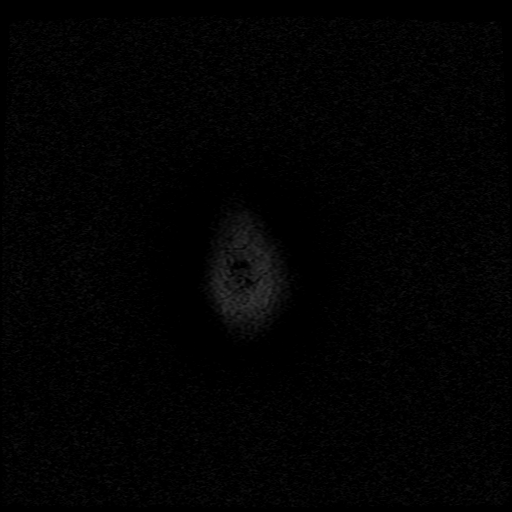

[Series 10: T2 · coronal · 5.0mm · 0.39mm/px · 3 of 25 slices shown (2 of 2)]
[im 1/25]
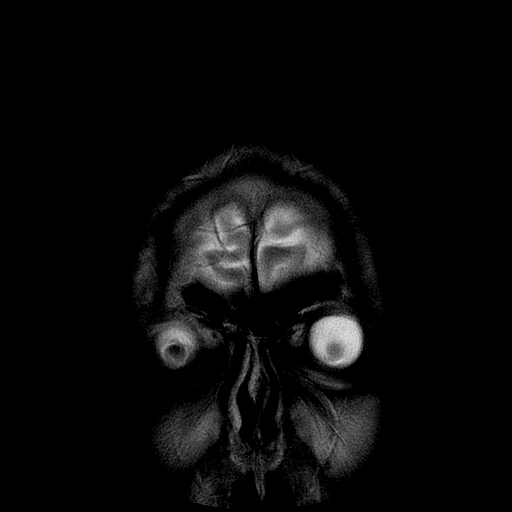
[im 13/25]
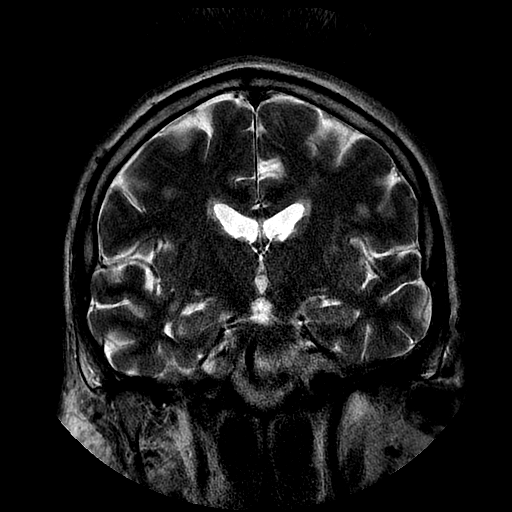
[im 25/25]
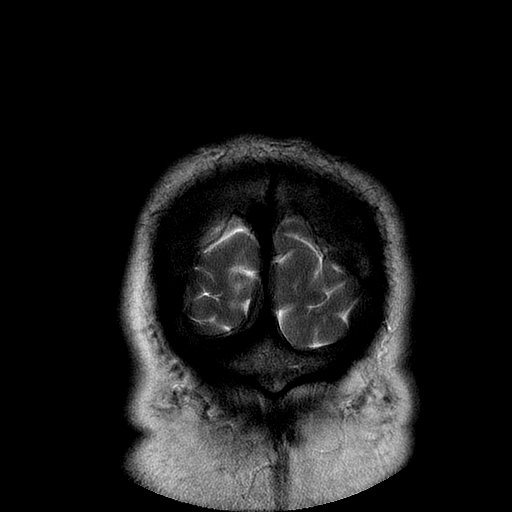

[Series 300: DWI · axial · 3.0mm · 1.09mm/px · z∈[-35,+103]mm · 5 of 47 slices shown (3 of 4)]
[im 1/47]
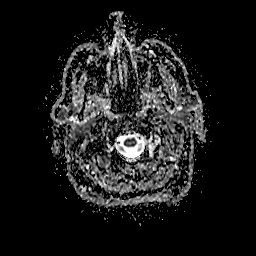
[im 12/47]
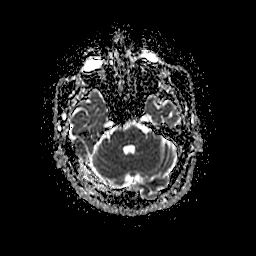
[im 24/47]
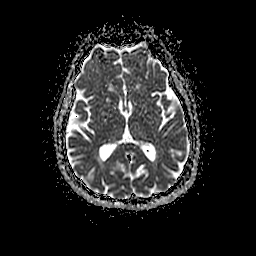
[im 35/47]
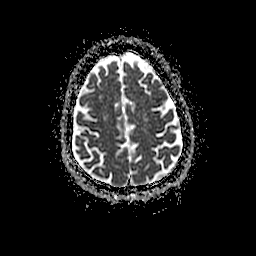
[im 47/47]
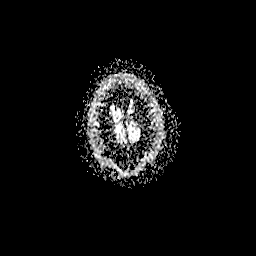

[Series 400: DWI · coronal · 5.0mm · 1.09mm/px · 3 of 34 slices shown (4 of 4)]
[im 1/34]
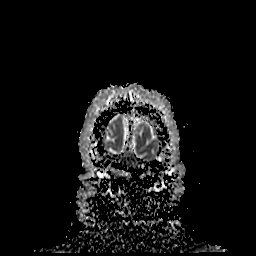
[im 17/34]
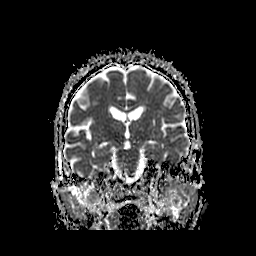
[im 34/34]
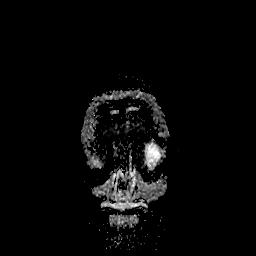

[36 of 48 positions shown; findings below may reference images not displayed]

FINDINGS: Brain: Acute infarct in the right (series 3 and 300, image 11). Mild
edema without significant mass effect. Additional moderate scattered
T2/FLAIR hyperintensities in the white matter, nonspecific but
compatible with chronic microvascular ischemic disease. Small remote
lacunar infarct in the left corona radiata. No evidence of acute
hemorrhage, mass lesion, midline shift, hydrocephalus, or
extra-axial fluid collection.

Vascular: Major arterial flow voids are maintained at the skull
base.

Skull and upper cervical spine: Normal marrow signal.

Sinuses/Orbits: Clear sinuses.  No acute orbital findings.

Other: No mastoid effusions.
IMPRESSION: 1. Acute right pontine infarct.
2. Moderate chronic microvascular ischemic disease.

## 2022-02-03 IMAGING — MR MR THORACIC SPINE W/O CM
4 of 6 series · 26 of 48 positions shown · non-contrast
Comparison: Comparison made with prior CT from [DATE].

CLINICAL DATA: Initial evaluation for bilateral lower extremity
weakness.

EXAM:
MRI THORACIC AND LUMBAR SPINE WITHOUT CONTRAST
TECHNIQUE: Multiplanar and multiecho pulse sequences of the thoracic and lumbar
spine were obtained without intravenous contrast.

[Series 14: T1 · sagittal · 6.0mm · 1.23mm/px · 3 of 13 slices shown (1 of 2)]
[im 1/13]
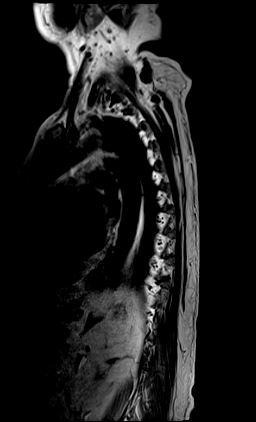
[im 7/13]
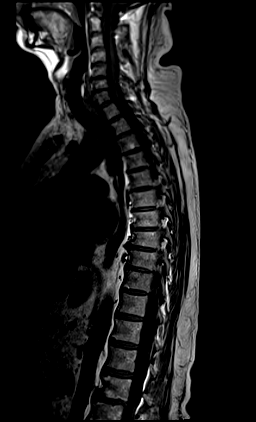
[im 13/13]
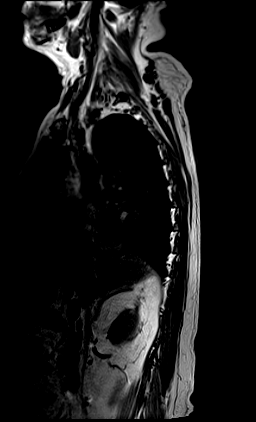

[Series 15: T2 · sagittal · 3.0mm · 0.76mm/px · 7 of 21 slices shown (1 of 2)]
[im 1/21]
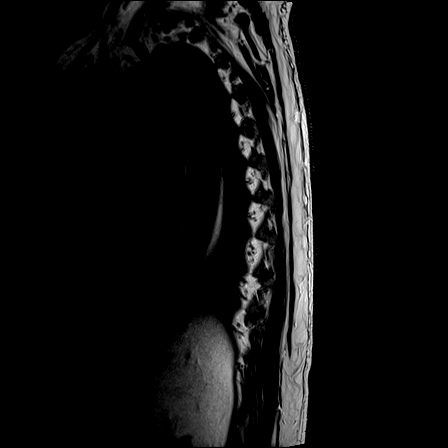
[im 4/21]
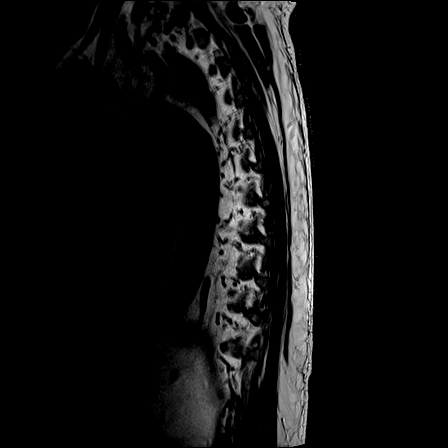
[im 7/21]
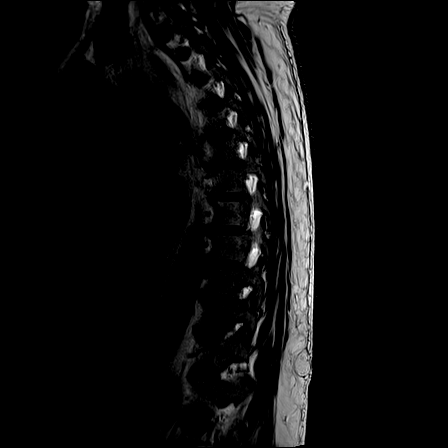
[im 11/21]
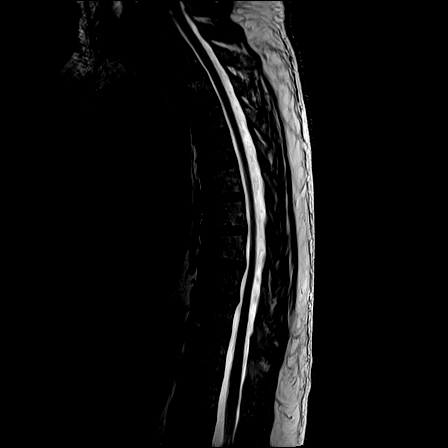
[im 14/21]
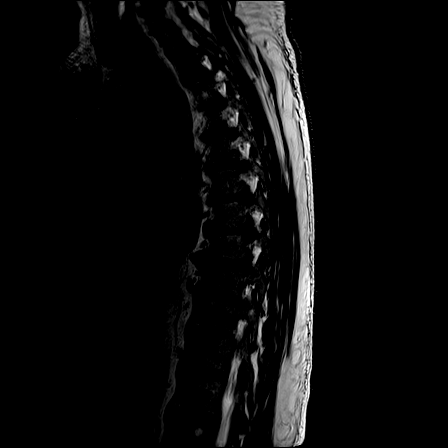
[im 17/21]
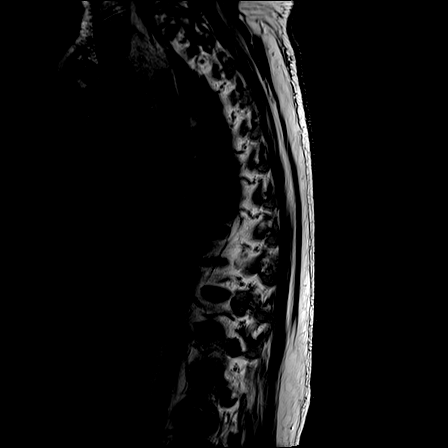
[im 21/21]
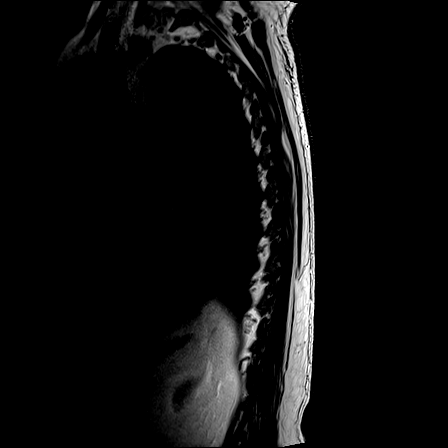

[Series 16: T1 · sagittal · 3.0mm · 0.76mm/px · 7 of 21 slices shown (2 of 2)]
[im 1/21]
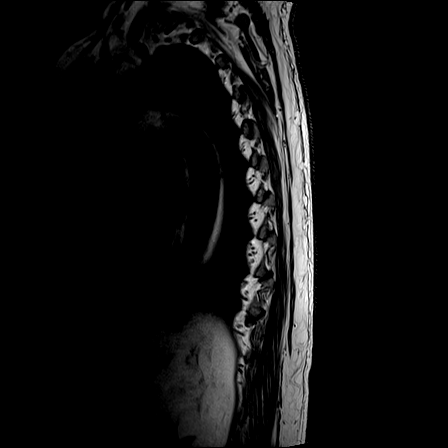
[im 4/21]
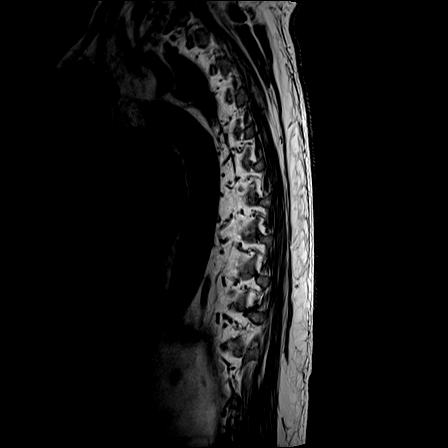
[im 7/21]
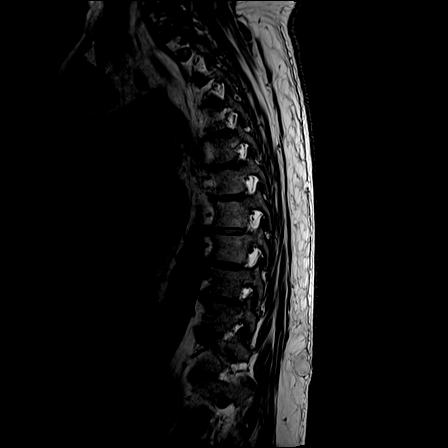
[im 11/21]
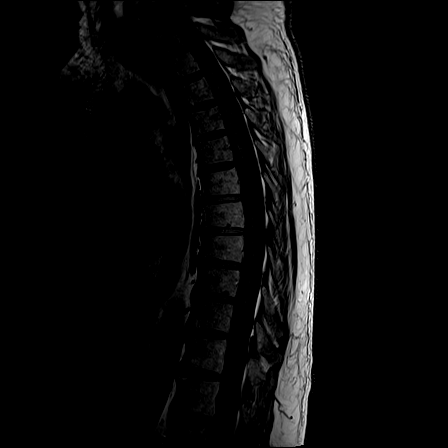
[im 14/21]
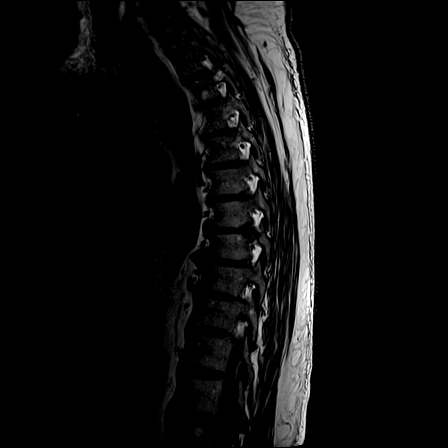
[im 17/21]
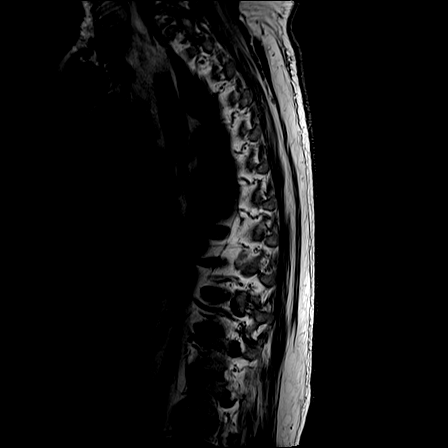
[im 21/21]
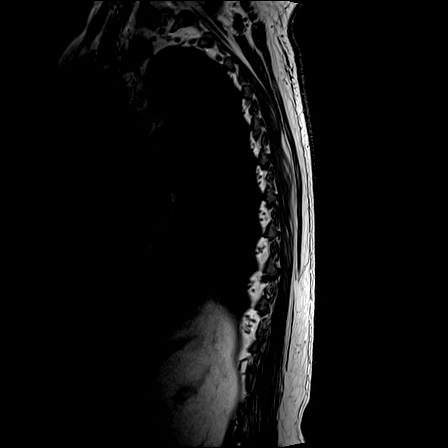

[Series 18: T2 · axial · 4.0mm · 0.59mm/px · z∈[-201,+31]mm · 9 of 39 slices shown (2 of 2)]
[im 1/39]
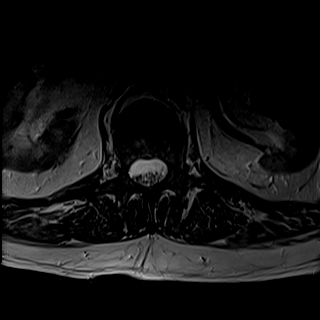
[im 7/39]
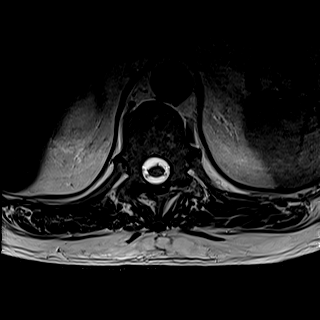
[im 11/39]
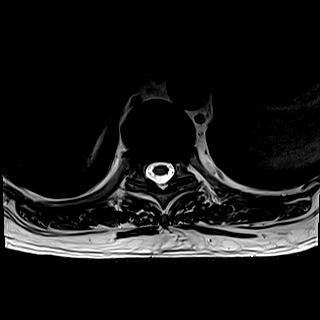
[im 18/39]
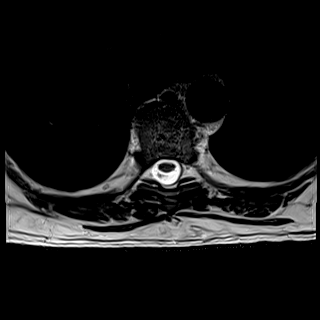
[im 21/39]
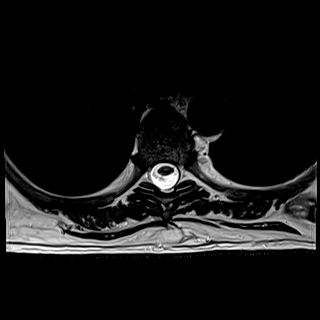
[im 28/39]
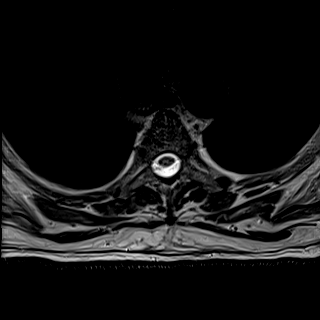
[im 32/39]
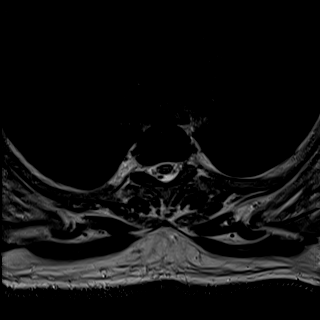
[im 35/39]
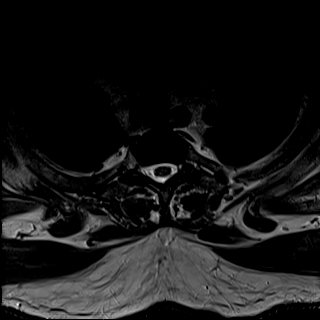
[im 39/39]
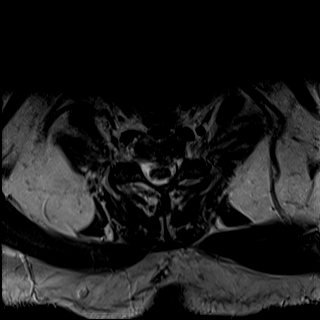

[26 of 48 positions shown; findings below may reference images not displayed]

FINDINGS: MRI THORACIC SPINE FINDINGS

Alignment: Physiologic with preservation of the normal thoracic
kyphosis. No listhesis.

Vertebrae: Vertebral body height maintained without acute or chronic
fracture. Bone marrow signal intensity within normal limits. Few
scattered benign hemangiomata noted. No worrisome osseous lesions.
No abnormal marrow edema.

Cord:  Normal signal and morphology.

Paraspinal and other soft tissues: 1.7 cm area of STIR
hyperintensity noted within the subcutaneous fat of the lower
thoracic spine at the level of T11 (series 17, image 14),
nonspecific, but could reflect a small area of fat necrosis.
Paraspinous soft tissues demonstrate no other acute finding.
Asymmetric left renal atrophy partially visualized.

Disc levels:

T7-8: Left paracentral disc protrusion indents the left ventral
thecal sac. Mild cord flattening without cord signal changes or
significant stenosis.

T8-9: Central disc protrusion indents the ventral thecal sac.
Minimal cord flattening without cord signal changes or significant
stenosis.

T9-10: Broad-based central disc protrusion mildly flattens the
ventral thecal sac without significant stenosis or cord impingement.

Otherwise, fairly mild spondylosis seen elsewhere within the
thoracic spine for age. No other significant spinal stenosis.
Foramina remain patent. No neural impingement.

MRI LUMBAR SPINE FINDINGS

Segmentation: Standard. Lowest well-formed disc space labeled the
L5-S1 level.

Alignment: Dextroscoliosis. 3 mm retrolisthesis of L2 on L3 and L3
on L4, with trace retrolisthesis of L4 on L5. Findings chronic and
degenerative.

Vertebrae: Vertebral body height maintained without acute or chronic
fracture. Bone marrow signal intensity within normal limits. Few
scattered benign hemangiomata noted. No worrisome osseous lesions.
Prominent discogenic reactive endplate change with marrow edema
present about the L2-3 interspace, more pronounced on the left. No
other abnormal marrow edema.

Conus medullaris and cauda equina: Conus extends to the L1 level.
Conus and cauda equina appear normal.

Paraspinal and other soft tissues: Paraspinous soft tissues
demonstrate no acute finding. Asymmetric left renal atrophy with
perinephric stranding, better evaluated on recent CT. Few scattered
superimposed benign-appearing cysts, no follow-up imaging
recommended.

Disc levels:

L1-2:  Minimal disc bulge.  Mild facet spurring.  No stenosis.

L2-3: Disc bulge with disc desiccation and reactive endplate change,
asymmetric to the left. Left greater than right facet hypertrophy.
Resultant mild to moderate left lateral recess stenosis. Central
canal remains patent. Mild to moderate left L2 foraminal narrowing.
Right neural foramen is patent.

L3-4: Disc bulge with disc desiccation and reactive endplate
spurring, slightly asymmetric to the left. Mild facet and ligament
flavum hypertrophy. Resultant mild canal with bilateral subarticular
stenosis. Mild to moderate left L3 foraminal narrowing. Right neural
foramen remains patent.

L4-5: Disc bulge with reactive endplate spurring, asymmetric to the
right. Moderate right with mild left facet hypertrophy. Resultant
mild to moderate right lateral recess stenosis. Central canal
remains patent. Mild to moderate right L4 foraminal stenosis. Left
neural foramen remains widely patent.

L5-S1: Shallow broad-based central to left subarticular disc
protrusion closely approximates the descending left S1 nerve root
without frank neural impingement (series 8, image 29). Moderate
right greater than left facet hypertrophy. No significant spinal
stenosis. Foramina remain patent.
IMPRESSION: MRI THORACIC SPINE IMPRESSION:

1. Normal MRI appearance of the thoracic spinal cord. No findings to
explain patient's symptoms.
2. Small central disc protrusions at T7-8, T8-9, and T9-10 without
significant stenosis.

MRI LUMBAR SPINE IMPRESSION:

1. Normal MRI appearance of the conus medullaris and nerve roots of
the cauda equina. No evidence for cord compression or other findings
to explain patient's symptoms.
2. Left eccentric disc bulge with facet hypertrophy at L2-3 and L3-4
with resultant mild to moderate left lateral recess and foraminal
stenosis.
3. Right eccentric disc bulge with facet hypertrophy at L4-5 with
resultant mild to moderate right lateral recess and foraminal
stenosis.
4. Shallow central to left subarticular disc protrusion at L5-S1,
closely approximating and potentially irritating the descending left
S1 nerve root.

## 2022-02-03 IMAGING — MR MR LUMBAR SPINE W/O CM
4 of 5 series · 26 of 48 positions shown · non-contrast
Comparison: Comparison made with prior CT from [DATE].

CLINICAL DATA: Initial evaluation for bilateral lower extremity
weakness.

EXAM:
MRI THORACIC AND LUMBAR SPINE WITHOUT CONTRAST
TECHNIQUE: Multiplanar and multiecho pulse sequences of the thoracic and lumbar
spine were obtained without intravenous contrast.

[Series 5: T2 · sagittal · 4.0mm · 0.73mm/px · 7 of 19 slices shown (1 of 2)]
[im 1/19]
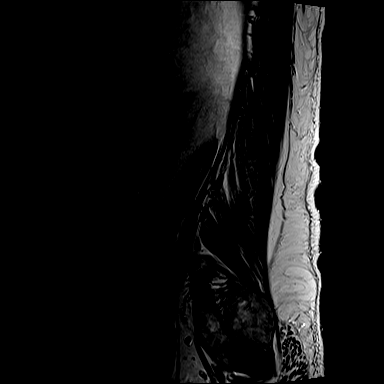
[im 4/19]
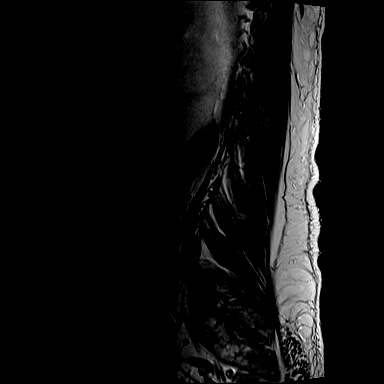
[im 7/19]
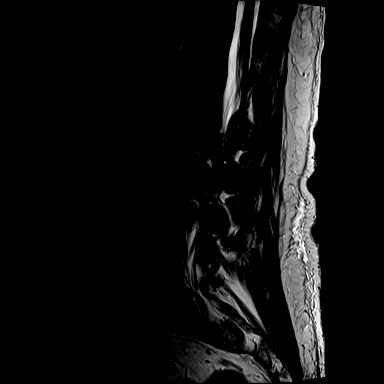
[im 10/19]
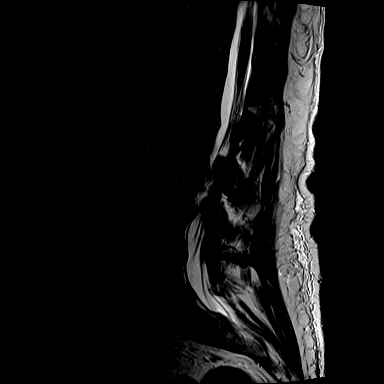
[im 13/19]
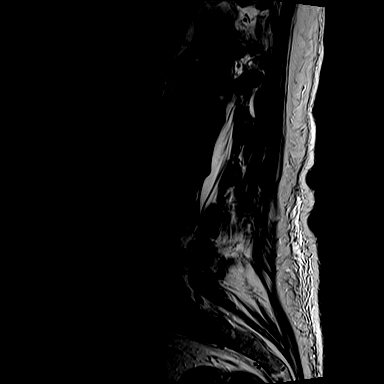
[im 16/19]
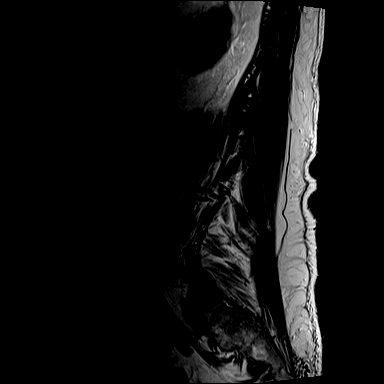
[im 19/19]
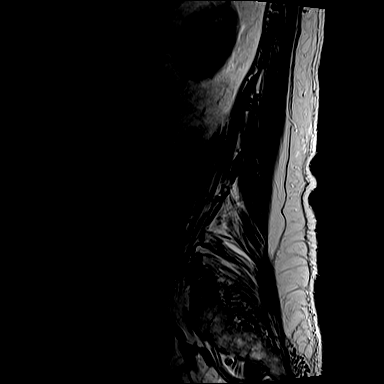

[Series 7: T1 · sagittal · 4.0mm · 0.88mm/px · 7 of 19 slices shown (1 of 2)]
[im 1/19]
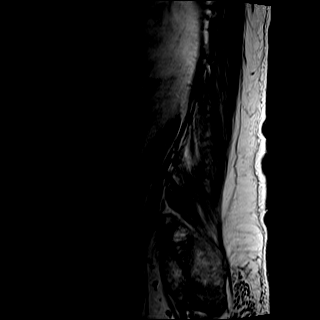
[im 4/19]
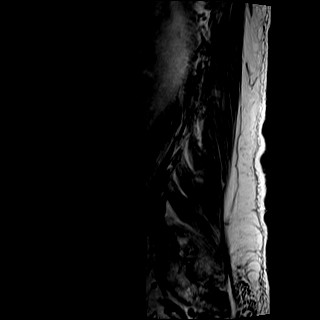
[im 7/19]
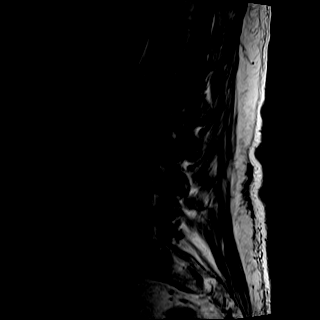
[im 10/19]
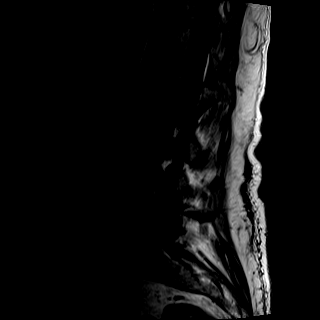
[im 13/19]
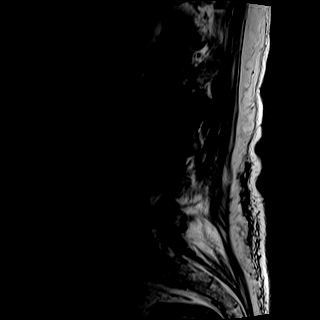
[im 16/19]
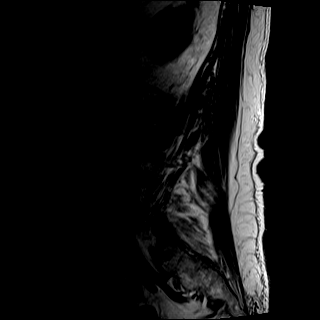
[im 19/19]
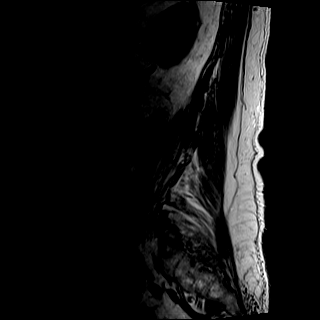

[Series 8: T2 · axial · 4.0mm · 0.57mm/px · z∈[-414,-240]mm · 9 of 36 slices shown (2 of 2)]
[im 1/36]
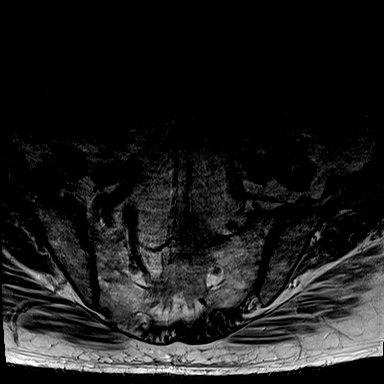
[im 6/36]
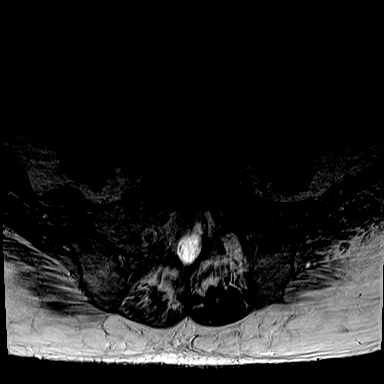
[im 12/36]
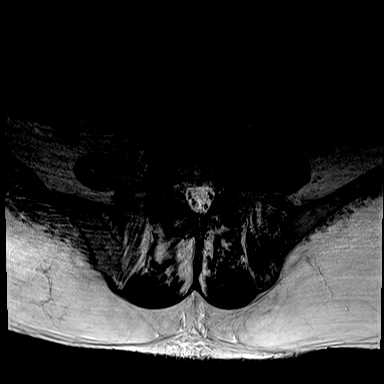
[im 15/36]
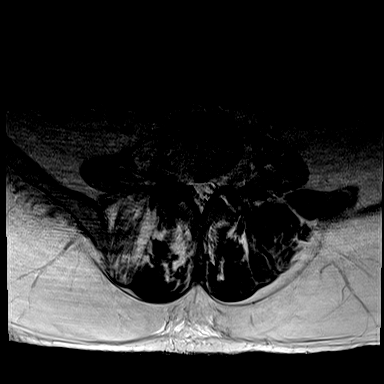
[im 18/36]
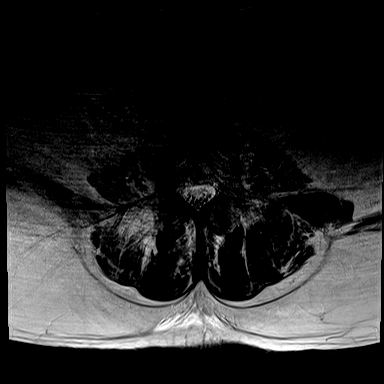
[im 21/36]
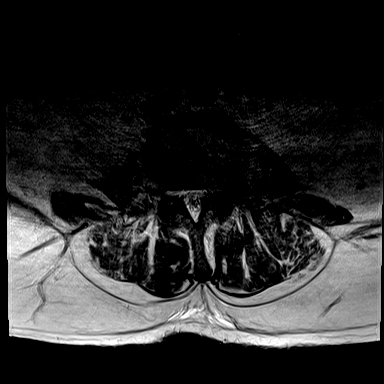
[im 24/36]
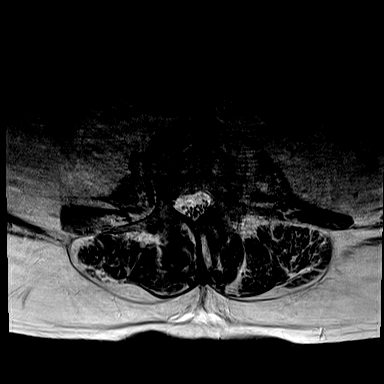
[im 30/36]
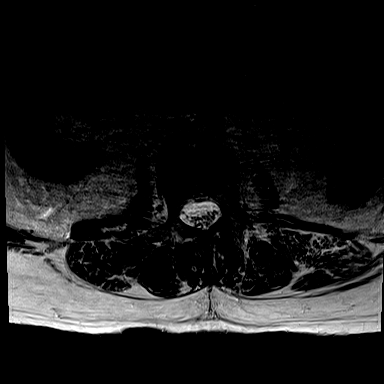
[im 36/36]
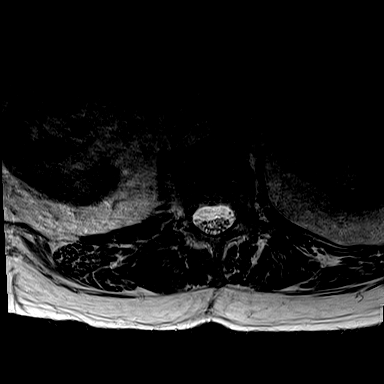

[Series 9: T1 · axial · 4.0mm · 0.34mm/px · z∈[-391,-258]mm · 3 of 40 slices shown (2 of 2)]
[im 7/40]
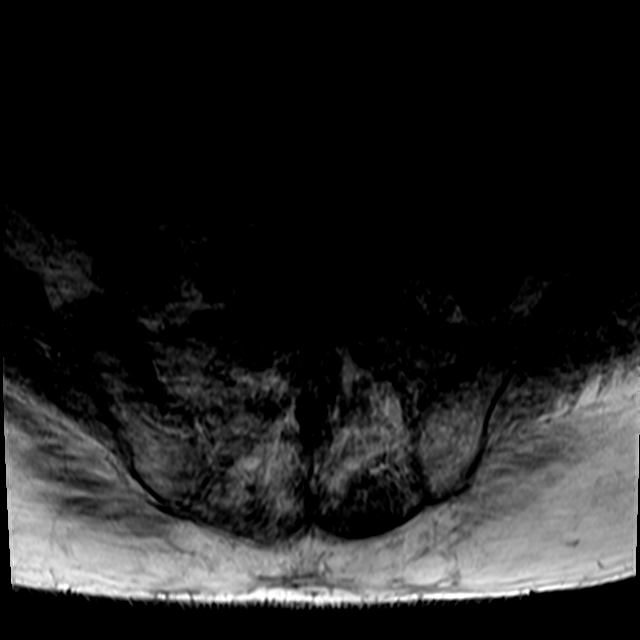
[im 22/40]
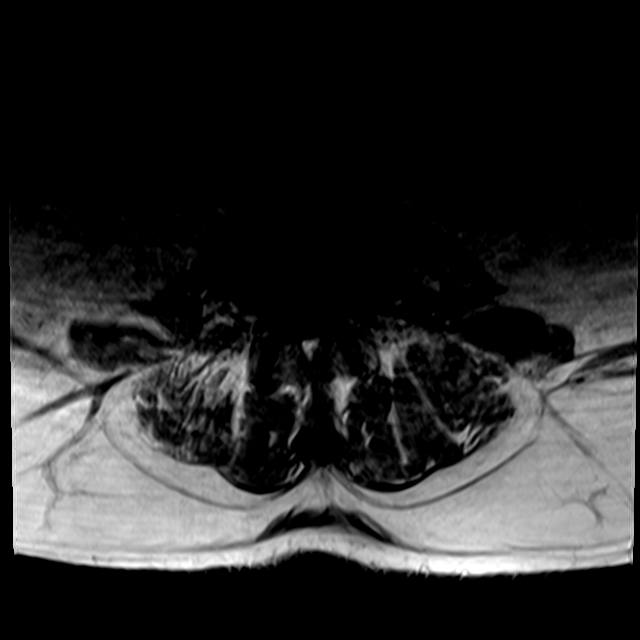
[im 34/40]
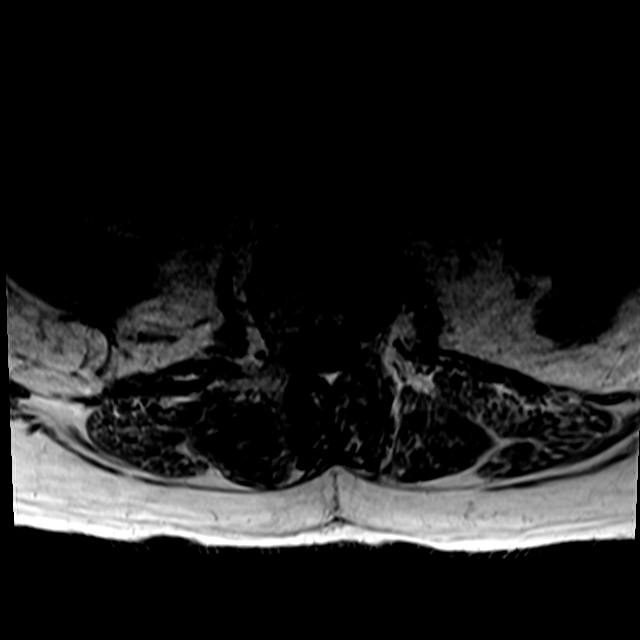

[26 of 48 positions shown; findings below may reference images not displayed]

FINDINGS: MRI THORACIC SPINE FINDINGS

Alignment: Physiologic with preservation of the normal thoracic
kyphosis. No listhesis.

Vertebrae: Vertebral body height maintained without acute or chronic
fracture. Bone marrow signal intensity within normal limits. Few
scattered benign hemangiomata noted. No worrisome osseous lesions.
No abnormal marrow edema.

Cord:  Normal signal and morphology.

Paraspinal and other soft tissues: 1.7 cm area of STIR
hyperintensity noted within the subcutaneous fat of the lower
thoracic spine at the level of T11 (series 17, image 14),
nonspecific, but could reflect a small area of fat necrosis.
Paraspinous soft tissues demonstrate no other acute finding.
Asymmetric left renal atrophy partially visualized.

Disc levels:

T7-8: Left paracentral disc protrusion indents the left ventral
thecal sac. Mild cord flattening without cord signal changes or
significant stenosis.

T8-9: Central disc protrusion indents the ventral thecal sac.
Minimal cord flattening without cord signal changes or significant
stenosis.

T9-10: Broad-based central disc protrusion mildly flattens the
ventral thecal sac without significant stenosis or cord impingement.

Otherwise, fairly mild spondylosis seen elsewhere within the
thoracic spine for age. No other significant spinal stenosis.
Foramina remain patent. No neural impingement.

MRI LUMBAR SPINE FINDINGS

Segmentation: Standard. Lowest well-formed disc space labeled the
L5-S1 level.

Alignment: Dextroscoliosis. 3 mm retrolisthesis of L2 on L3 and L3
on L4, with trace retrolisthesis of L4 on L5. Findings chronic and
degenerative.

Vertebrae: Vertebral body height maintained without acute or chronic
fracture. Bone marrow signal intensity within normal limits. Few
scattered benign hemangiomata noted. No worrisome osseous lesions.
Prominent discogenic reactive endplate change with marrow edema
present about the L2-3 interspace, more pronounced on the left. No
other abnormal marrow edema.

Conus medullaris and cauda equina: Conus extends to the L1 level.
Conus and cauda equina appear normal.

Paraspinal and other soft tissues: Paraspinous soft tissues
demonstrate no acute finding. Asymmetric left renal atrophy with
perinephric stranding, better evaluated on recent CT. Few scattered
superimposed benign-appearing cysts, no follow-up imaging
recommended.

Disc levels:

L1-2:  Minimal disc bulge.  Mild facet spurring.  No stenosis.

L2-3: Disc bulge with disc desiccation and reactive endplate change,
asymmetric to the left. Left greater than right facet hypertrophy.
Resultant mild to moderate left lateral recess stenosis. Central
canal remains patent. Mild to moderate left L2 foraminal narrowing.
Right neural foramen is patent.

L3-4: Disc bulge with disc desiccation and reactive endplate
spurring, slightly asymmetric to the left. Mild facet and ligament
flavum hypertrophy. Resultant mild canal with bilateral subarticular
stenosis. Mild to moderate left L3 foraminal narrowing. Right neural
foramen remains patent.

L4-5: Disc bulge with reactive endplate spurring, asymmetric to the
right. Moderate right with mild left facet hypertrophy. Resultant
mild to moderate right lateral recess stenosis. Central canal
remains patent. Mild to moderate right L4 foraminal stenosis. Left
neural foramen remains widely patent.

L5-S1: Shallow broad-based central to left subarticular disc
protrusion closely approximates the descending left S1 nerve root
without frank neural impingement (series 8, image 29). Moderate
right greater than left facet hypertrophy. No significant spinal
stenosis. Foramina remain patent.
IMPRESSION: MRI THORACIC SPINE IMPRESSION:

1. Normal MRI appearance of the thoracic spinal cord. No findings to
explain patient's symptoms.
2. Small central disc protrusions at T7-8, T8-9, and T9-10 without
significant stenosis.

MRI LUMBAR SPINE IMPRESSION:

1. Normal MRI appearance of the conus medullaris and nerve roots of
the cauda equina. No evidence for cord compression or other findings
to explain patient's symptoms.
2. Left eccentric disc bulge with facet hypertrophy at L2-3 and L3-4
with resultant mild to moderate left lateral recess and foraminal
stenosis.
3. Right eccentric disc bulge with facet hypertrophy at L4-5 with
resultant mild to moderate right lateral recess and foraminal
stenosis.
4. Shallow central to left subarticular disc protrusion at L5-S1,
closely approximating and potentially irritating the descending left
S1 nerve root.

## 2022-02-03 MED ORDER — VITAMIN B-12 2500 MCG SL SUBL: 1.0000 | SUBLINGUAL_TABLET | Freq: Every day | SUBLINGUAL | Status: DC

## 2022-02-03 MED ORDER — STROKE: EARLY STAGES OF RECOVERY BOOK
Freq: Once | Status: DC
  Filled 2022-02-03: qty 1

## 2022-02-03 MED ORDER — SODIUM CHLORIDE 0.9 % IV SOLN: INTRAVENOUS | Status: DC

## 2022-02-03 MED ORDER — CYCLOBENZAPRINE HCL 5 MG PO TABS
10.0000 mg | ORAL_TABLET | Freq: Every day | ORAL | Status: DC
  Administered 2022-02-03 – 2022-02-07 (×5): 10 mg via ORAL
  Filled 2022-02-03 (×6): qty 2

## 2022-02-03 MED ORDER — ADULT MULTIVITAMIN W/MINERALS CH
1.0000 | ORAL_TABLET | Freq: Every day | ORAL | Status: DC
  Administered 2022-02-03 – 2022-02-08 (×6): 1 via ORAL
  Filled 2022-02-03 (×6): qty 1

## 2022-02-03 MED ORDER — VITAMIN D 25 MCG (1000 UNIT) PO TABS
1000.0000 [IU] | ORAL_TABLET | Freq: Every day | ORAL | Status: DC
  Administered 2022-02-03 – 2022-02-08 (×6): 1000 [IU] via ORAL
  Filled 2022-02-03 (×6): qty 1

## 2022-02-03 MED ORDER — CITALOPRAM HYDROBROMIDE 20 MG PO TABS
20.0000 mg | ORAL_TABLET | Freq: Every day | ORAL | Status: DC
  Administered 2022-02-03 – 2022-02-08 (×6): 20 mg via ORAL
  Filled 2022-02-03: qty 2
  Filled 2022-02-03 (×5): qty 1

## 2022-02-03 MED ORDER — POTASSIUM CHLORIDE CRYS ER 20 MEQ PO TBCR
40.0000 meq | EXTENDED_RELEASE_TABLET | Freq: Every day | ORAL | Status: DC
  Administered 2022-02-03 – 2022-02-08 (×6): 40 meq via ORAL
  Filled 2022-02-03 (×6): qty 2

## 2022-02-03 MED ORDER — ALPRAZOLAM 0.25 MG PO TABS
0.5000 mg | ORAL_TABLET | Freq: Once | ORAL | Status: AC
  Administered 2022-02-03: 0.5 mg via ORAL
  Filled 2022-02-03: qty 2

## 2022-02-03 NOTE — Evaluation (Signed)
Physical Therapy Evaluation ?Patient Details ?Name: [name] ?MRN: [id number] ?DOB: 1956-05-17 ?Today's Date: 02/03/2022 ? ?History of Present Illness ? Pt is a 66 y/o male admitted secondary to progressive UE and LE weakness. Workup pending, however, differential including myositis from statin use. MRI showed acute R pontine infarct. PMH includes DM, CAD, HTN, and, MI.  ?Clinical Impression ? Pt admitted secondary to problem above with deficits below. Pt with increased weakness in decreased coordination in extremities. Pt requiring up to mod A during dynamic gait tasks secondary to weakness and instability. Pt previously very independent and motivated to regain his independence. Recommending AIR level therapies to increase independence and safety prior to return home. Will continue to follow acutely.    ?   ? ?Recommendations for follow up therapy are one component of a multi-disciplinary discharge planning process, led by the attending physician.  Recommendations may be updated based on patient status, additional functional criteria and insurance authorization. ? ?Follow Up Recommendations Acute inpatient rehab (3hours/day) ? ?  ?Assistance Recommended at Discharge Frequent or constant Supervision/Assistance  ?Patient can return home with the following ? A little help with walking and/or transfers;A little help with bathing/dressing/bathroom;Assistance with cooking/housework;Help with stairs or ramp for entrance;Assist for transportation ? ?  ?Equipment Recommendations Other (comment) (TBD pending progression)  ?Recommendations for Other Services ?    ?  ?Functional Status Assessment Patient has had a recent decline in their functional status and demonstrates the ability to make significant improvements in function in a reasonable and predictable amount of time.  ? ?  ?Precautions / Restrictions Precautions ?Precautions: Fall ?Restrictions ?Weight Bearing Restrictions: No  ? ?  ? ?Mobility ? Bed Mobility ?  ?   ?  ?  ?  ?  ?  ?General bed mobility comments: Sitting EOB upon entry ?  ? ?Transfers ?Overall transfer level: Needs assistance ?Equipment used: 1 person hand held assist ?Transfers: Sit to/from Stand ?Sit to Stand: Min assist ?  ?  ?  ?  ?  ?General transfer comment: Mild posterior LOB upon standing. Required support from stretcher and min A from PT for stability. ?  ? ?Ambulation/Gait ?Ambulation/Gait assistance: Min assist, Mod assist ?Gait Distance (Feet): 50 Feet ?Assistive device: 1 person hand held assist ?Gait Pattern/deviations: Step-through pattern, Decreased stride length, Knee flexed in stance - right, Knee flexed in stance - left, Trunk flexed ?  ?  ?  ?General Gait Details: Unsteady, uncoordinated gait. Noted BLE weakness and buckling bilaterally. Requiring min up to mod A for stability, especially in dynamic gait tasks. ? ?Stairs ?  ?  ?  ?  ?  ? ?Wheelchair Mobility ?  ? ?Modified Rankin (Stroke Patients Only) ?Modified Rankin (Stroke Patients Only) ?Pre-Morbid Rankin Score: No symptoms ?Modified Rankin: Moderately severe disability ? ?  ? ?Balance Overall balance assessment: Needs assistance ?Sitting-balance support: No upper extremity supported, Feet supported ?Sitting balance-Leahy Scale: Fair ?Sitting balance - Comments: had difficulty with dynamic activities like when pt was attempting to tie shoes ?  ?Standing balance support: Single extremity supported ?Standing balance-Leahy Scale: Poor ?Standing balance comment: Reliant on UE and external support ?  ?  ?  ?  ?  ?  ?  ?  ?  ?  ?  ?   ? ? ? ?Pertinent Vitals/Pain Pain Assessment ?Pain Assessment: Faces ?Faces Pain Scale: Hurts little more ?Pain Location: BLE ?Pain Descriptors / Indicators: Grimacing, Guarding ?Pain Intervention(s): Limited activity within patient's tolerance, Monitored during session, Repositioned  ? ? ?  Home Living Family/patient expects to be discharged to:: Private residence ?Living Arrangements: Alone ?Available Help at  Discharge: Family ?Type of Home: House ?Home Access: Stairs to enter ?Entrance Stairs-Rails: None ?Entrance Stairs-Number of Steps: 1 (porch step) ?  ?Home Layout: One level ?Home Equipment: Cane - quad ?   ?  ?Prior Function Prior Level of Function : Independent/Modified Independent ?  ?  ?  ?  ?  ?  ?Mobility Comments: using cane for mobility since becoming weak. Has had falls at home. Previously was very independent ?  ?  ? ? ?Hand Dominance  ?   ? ?  ?Extremity/Trunk Assessment  ? Upper Extremity Assessment ?Upper Extremity Assessment: Defer to OT evaluation (limited shoulder flexion/abduction) ?  ? ?Lower Extremity Assessment ?Lower Extremity Assessment: RLE deficits/detail;LLE deficits/detail ?RLE Deficits / Details: Grossly 3/5 ?RLE Coordination: decreased gross motor;decreased fine motor ?LLE Deficits / Details: Grossly 3/5 ?LLE Coordination: decreased gross motor;decreased fine motor ?  ? ?Cervical / Trunk Assessment ?Cervical / Trunk Assessment: Kyphotic  ?Communication  ? Communication: No difficulties  ?Cognition Arousal/Alertness: Awake/alert ?Behavior During Therapy: [facility] for tasks assessed/performed ?Overall Cognitive Status: Within Functional Limits for tasks assessed ?  ?  ?  ?  ?  ?  ?  ?  ?  ?  ?  ?  ?  ?  ?  ?  ?  ?  ?  ? ?  ?General Comments   ? ?  ?Exercises    ? ?Assessment/Plan  ?  ?PT Assessment Patient needs continued PT services  ?PT Problem List Decreased strength;Decreased activity tolerance;Decreased balance;Decreased mobility;Decreased knowledge of use of DME ? ?   ?  ?PT Treatment Interventions Gait training;DME instruction;Stair training;Functional mobility training;Therapeutic activities;Therapeutic exercise;Balance training;Patient/family education   ? ?PT Goals (Current goals can be found in the Care Plan section)  ?Acute Rehab PT Goals ?Patient Stated Goal: to be independent ?PT Goal Formulation: With patient ?Time For Goal Achievement: 02/17/22 ?Potential to Achieve Goals:  Good ? ?  ?Frequency Min 3X/week ?  ? ? ?Co-evaluation   ?  ?  ?  ?  ? ? ?  ?AM-PAC PT "6 Clicks" Mobility  ?Outcome Measure Help needed turning from your back to your side while in a flat bed without using bedrails?: A Little ?Help needed moving from lying on your back to sitting on the side of a flat bed without using bedrails?: A Little ?Help needed moving to and from a bed to a chair (including a wheelchair)?: A Little ?Help needed standing up from a chair using your arms (e.g., wheelchair or bedside chair)?: A Little ?Help needed to walk in hospital room?: A Lot ?Help needed climbing 3-5 steps with a railing? : Total ?6 Click Score: 15 ? ?  ?End of Session Equipment Utilized During Treatment: Gait belt ?Activity Tolerance: Patient tolerated treatment well ?Patient left: in chair;with call bell/phone within reach (in recliner in ED) ?Nurse Communication: Mobility status ?PT Visit Diagnosis: Unsteadiness on feet (R26.81);Muscle weakness (generalized) (M62.81);Difficulty in walking, not elsewhere classified (R26.2) ?  ? ?Time: 1062-6948 ?PT Time Calculation (min) (ACUTE ONLY): 17 min ? ? ?Charges:   PT Evaluation ?$PT Eval Moderate Complexity: 1 Mod ?  ?  ?   ? ? ?Reuel Derby, PT, DPT  ?Acute Rehabilitation Services  ?Pager: [phone] ?Office: [phone] ? ? ?[place] ?02/03/2022, 1:59 PM ?

## 2022-02-03 NOTE — TOC Initial Note (Signed)
Transition of Care (TOC) - Initial/Assessment Note  ? ? ?Patient Details  ?Name: [name] ?MRN: [id number] ?Date of Birth: [birth year] ? ?Transition of Care (TOC) CM/SW Contact:    ?Verdell Carmine, RN ?Phone Number: ?02/03/2022, 5:28 PM ? ?Clinical Narrative:                 ? ?Patient presents with increasing weakness, 4 extremities, needing assistance with mobilization.  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 Has been on a statin past 4 weeks, now with increased LFT's and wekness. MRI brain reveals Pontine infarct. Cspine MRI reveals foraminal narrowing and possible impingements. ? ?Statin has been stopped. PT recommending CIR/IP rehab for patient. They are aware and deem him a potential candidate.  ? ? CM will continue to follow for needs, recommendations, and transitions.  ?  ?Expected Discharge Plan: [place] ?Barriers to Discharge: Continued Medical Work up ? ? ?Patient Goals and CMS Choice ?  ?  ?  ? ?Expected Discharge Plan and Services ?Expected Discharge Plan: [place] ?  ?Discharge Planning Services: CM Consult ?  ?Living arrangements for the past 2 months: [place] ?                ?  ?  ?  ?  ?  ?  ?  ?  ?  ?  ? ?Prior Living Arrangements/Services ?Living arrangements for the past 2 months: [place] ?  ?Patient language and need for interpreter reviewed:: Yes ?       ?Need for Family Participation in Patient Care: Yes (Comment) ?Care giver support system in place?: Yes (comment) ?  ?Criminal Activity/Legal Involvement Pertinent to Current Situation/Hospitalization: No - Comment as needed ? ?Activities of Daily Living ?  ?  ? ?Permission Sought/Granted ?  ?  ?   ?   ?   ?   ? ?Emotional Assessment ?  ?  ?  ?Orientation: : Oriented to Self, Oriented to Place, Oriented to  Time ?Alcohol / Substance Use: Not Applicable ?Psych Involvement: No (comment) ? ?Admission diagnosis:  Weakness [R53.1] ?Elevated CK [R74.8] ?Muscle weakness of all 4 extremities  [M62.81] ?Patient Active Problem List  ? Diagnosis Date Noted  ? Elevated CK 02/02/2022  ? Elevated LFTs 02/02/2022  ? CAD (coronary artery disease) 02/02/2022  ? Muscle weakness of all 4 extremities 02/02/2022  ? Hyperlipidemia 01/28/2022  ? NSTEMI (non-ST elevated myocardial infarction) ([place]) 12/27/2021  ? Arthritis of knee 09/02/2014  ? 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 Nephrolithiasis 02/01/2014  ? Pneumonia 08/27/2011  ? Type 2 diabetes mellitus ([place]) 08/27/2011  ? ?PCP:  Greig Right, MD ?Pharmacy:   ?[place] [place] [facility] [place] ?[address] ?[place] [address] ?Phone: [phone] Fax: [phone] ? ?[facility] ?[address] ?[place] [address] ?Phone: [phone] Fax: [phone] ? ? ? ? ?Social Determinants of Health (SDOH) Interventions ?  ? ?Readmission Risk Interventions ?   ? View : No data to display.  ?  ?  ?  ? ? ? ?

## 2022-02-03 NOTE — Evaluation (Signed)
Occupational Therapy Evaluation Patient Details Name: [name] MRN: [id number] DOB: 1955/12/20 Today's Date: 02/03/2022   History of Present Illness Pt is a 66 y/o male admitted secondary to progressive UE and LE weakness. Workup pending, however, differential including myositis from statin use. MRI showed acute R pontine infarct. PMH includes DM, CAD, HTN, and, MI.   Clinical Impression   Pt admitted for concerns listed above. PTA pt reported that he was independent with all ADL's and IADL's, including working as a Engineer, materials. At this time, pt presents with increased weakness in all extremities and balance deficits. He is requiring min-mod A for most ADL's and functional mobility. Pt overall cognitively and visually appears [facility]. Recommending AIR to maximize his independence. OT will follow acutely.       Recommendations for follow up therapy are one component of a multi-disciplinary discharge planning process, led by the attending physician.  Recommendations may be updated based on patient status, additional functional criteria and insurance authorization.   Follow Up Recommendations  Acute inpatient rehab (3hours/day)    Assistance Recommended at Discharge Intermittent Supervision/Assistance  Patient can return home with the following A little help with walking and/or transfers;A lot of help with bathing/dressing/bathroom;Assistance with cooking/housework;Help with stairs or ramp for entrance    Functional Status Assessment  Patient has had a recent decline in their functional status and demonstrates the ability to make significant improvements in function in a reasonable and predictable amount of time.  Equipment Recommendations  Other (comment) (TBD)    Recommendations for Other Services Rehab consult     Precautions / Restrictions Precautions Precautions: Fall Restrictions Weight Bearing Restrictions: No      Mobility Bed Mobility Overal bed mobility: Needs  Assistance Bed Mobility: Supine to Sit     Supine to sit: Min assist, HOB elevated     General bed mobility comments: Min A to bring BLE off bed and push trunk up to sitting    Transfers Overall transfer level: Needs assistance Equipment used: 1 person hand held assist Transfers: Sit to/from Stand Sit to Stand: Min assist           General transfer comment: Mild posterior LOB upon standing. able to stabilize after moving away from bed      Balance Overall balance assessment: Needs assistance Sitting-balance support: No upper extremity supported, Feet supported Sitting balance-Leahy Scale: Fair Sitting balance - Comments: had difficulty with dynamic activities especially leaning forward/down   Standing balance support: Single extremity supported Standing balance-Leahy Scale: Poor Standing balance comment: Reliant on UE and external support                           ADL either performed or assessed with clinical judgement   ADL Overall ADL's : Needs assistance/impaired Eating/Feeding: Set up;Sitting   Grooming: Set up;Moderate assistance;Sitting Grooming Details (indicate cue type and reason): set up for deoderant, oral hygiene, and hand washing, mod A for brushing hair, washing full face, and shaving due to BUE weakness. Upper Body Bathing: Min guard;Sitting   Lower Body Bathing: Minimal assistance;Sitting/lateral leans;Sit to/from stand   Upper Body Dressing : Minimal assistance;Sitting   Lower Body Dressing: Moderate assistance;Sitting/lateral leans;Sit to/from stand   Toilet Transfer: Minimal assistance;Ambulation   Toileting- Clothing Manipulation and Hygiene: Minimal assistance;Sitting/lateral lean;Sit to/from stand       Functional mobility during ADLs: Minimal assistance General ADL Comments: pt limited by increased weakness     Vision Baseline Vision/History:  1 Wears glasses Ability to See in Adequate Light: 0 Adequate Patient Visual  Report: No change from baseline Vision Assessment?: No apparent visual deficits     Perception     Praxis      Pertinent Vitals/Pain Pain Assessment Pain Assessment: Faces Faces Pain Scale: Hurts little more Pain Location: BLE Pain Descriptors / Indicators: Grimacing, Guarding Pain Intervention(s): Monitored during session, Repositioned     Hand Dominance Left   Extremity/Trunk Assessment Upper Extremity Assessment Upper Extremity Assessment: RUE deficits/detail;LUE deficits/detail RUE Deficits / Details: Unable to flex past shoulder height, difficulty bringing hands to his face. difficulty with fine motor skills RUE Coordination: decreased gross motor;decreased fine motor LUE Deficits / Details: Unable to flex past shoulder height, difficulty bringing hands to his face. LUE Coordination: decreased gross motor   Lower Extremity Assessment Lower Extremity Assessment: Defer to PT evaluation   Cervical / Trunk Assessment Cervical / Trunk Assessment: Kyphotic   Communication Communication Communication: No difficulties   Cognition Arousal/Alertness: Awake/alert Behavior During Therapy: WFL for tasks assessed/performed Overall Cognitive Status: Within Functional Limits for tasks assessed                                       General Comments  VSS on RA    Exercises     Shoulder Instructions      Home Living Family/patient expects to be discharged to:: Private residence Living Arrangements: Alone Available Help at Discharge: Family Type of Home: House Home Access: Stairs to enter Secretary/administrator of Steps: 1 (porch step) Entrance Stairs-Rails: None Home Layout: One level     Bathroom Shower/Tub: Chief Strategy Officer: Standard     Home Equipment: Cane - quad          Prior Functioning/Environment Prior Level of Function : Independent/Modified Independent             Mobility Comments: using cane for mobility  since becoming weak. Has had falls at home. Previously was very independent          OT Problem List: Decreased strength;Decreased range of motion;Decreased activity tolerance;Impaired balance (sitting and/or standing);Impaired UE functional use;Pain      OT Treatment/Interventions: Self-care/ADL training;Therapeutic exercise;Energy conservation;Neuromuscular education;DME and/or AE instruction;Therapeutic activities;Patient/family education;Balance training    OT Goals(Current goals can be found in the care plan section) Acute Rehab OT Goals Patient Stated Goal: To get out of bed with no assist OT Goal Formulation: With patient Time For Goal Achievement: 02/17/22 Potential to Achieve Goals: Good ADL Goals Pt Will Perform Grooming: Independently;standing Pt Will Perform Lower Body Bathing: Independently;sit to/from stand;sitting/lateral leans Pt Will Perform Lower Body Dressing: Independently;sitting/lateral leans;sit to/from stand Pt Will Transfer to Toilet: Independently;ambulating Pt Will Perform Toileting - Clothing Manipulation and hygiene: Independently;sitting/lateral leans;sit to/from stand Additional ADL Goal #1: Pt will complete bed mobility with mod I as a precursor to ADL's.  OT Frequency: Min 2X/week    Co-evaluation              AM-PAC OT "6 Clicks" Daily Activity     Outcome Measure Help from another person eating meals?: A Little Help from another person taking care of personal grooming?: A Lot Help from another person toileting, which includes using toliet, bedpan, or urinal?: A Little Help from another person bathing (including washing, rinsing, drying)?: A Lot Help from another person to put on and taking off regular upper  body clothing?: A Little Help from another person to put on and taking off regular lower body clothing?: A Lot 6 Click Score: 15   End of Session Equipment Utilized During Treatment: Gait belt Nurse Communication: Mobility  status  Activity Tolerance: Patient tolerated treatment well Patient left: in bed;with call bell/phone within reach  OT Visit Diagnosis: Unsteadiness on feet (R26.81);Other abnormalities of gait and mobility (R26.89);Muscle weakness (generalized) (M62.81)                Time: 4098-1191 OT Time Calculation (min): 39 min Charges:  OT Evaluation $OT Eval Moderate Complexity: 1 Mod OT Treatments $Self Care/Home Management : 8-22 mins $Therapeutic Activity: 8-22 mins  Nazia Rhines H., OTR/L Acute Rehabilitation  Zedrick Springsteen Elane Kelcy Laible 02/03/2022, 9:26 PM

## 2022-02-03 NOTE — Progress Notes (Signed)
RN called to the room by CNA. RN found patient being condescending, and verbally abusive to staff already in the room, Pt demanding to get out of bed. Patient states " I am uncomfortable", RN explained to the patient that he cannot get out of bed without the proper assistance due to his bilateral lower extremities weakness.  ?

## 2022-02-03 NOTE — Progress Notes (Signed)
PT Cancellation Note ? ?Patient Details ?Name: [name] ?MRN: [id number] ?DOB: December 18, 1955 ? ? ?Cancelled Treatment:    Reason Eval/Treat Not Completed: Patient at procedure or test/unavailable Pt currently at MRI. Will follow up as schedule allows.  ? ?Reuel Derby, PT, DPT  ?Acute Rehabilitation Services  ?Pager: [phone] ?Office: [phone] ? ? ? ?[place] ?02/03/2022, 9:17 AM ?

## 2022-02-03 NOTE — Hospital Course (Signed)
[name] is a 66 y.o. 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 ?

## 2022-02-03 NOTE — Progress Notes (Signed)
Inpatient Rehab Admissions Coordinator Note:  ? ?Per PT recommendations patient was screened for CIR candidacy by Michel Santee, PT. At this time, pt appears to be a potential candidate for CIR. I will place an order for rehab consult for full assessment, per our protocol.  Please contact me any with questions.. ? ?Shann Medal, PT, DPT ?[phone] ?02/03/22 ?2:53 PM  ?

## 2022-02-03 NOTE — Progress Notes (Addendum)
Neurology brief update ? ?Please see note from Dr. Lorrin Goodell from this AM. Patient presented with symmetric proximal weakness BUE and BLE initially felt to be most c/w myositis. ? ?Brain MRI showed acute infarct R pons ? 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 ?Stroke workup ordered. Continue aspirin and brilinta. His upper extremity weakness could potentially be caused by the findings in his cervical spine however this would not explain the findings in his lower extremities given that the stenosis is neuroforaminal not central.  I will order MRI T and L-spine to be done tonight.  Recommend consultation with neurosurgery after those are completed. ? ?Su Monks, MD ?[facility] ?[phone] ? ?If 7pm- 7am, please page neurology on call as listed in [place]. ? ?

## 2022-02-03 NOTE — Progress Notes (Signed)
? [name]  SEG:[id number] DOB: 08/03/1956 DOA: 02/02/2022 ?PCP: Greig Right, MD   ? ?Brief Narrative:  ?66 year old with a history of CAD status post DES to ostial RCA 12/28/2021, DM2, and HTN who presented to the ED with progressive weakness in both lower extremities and upper extremities.  He had recently been discharged from the hospital 12/29/2021 after suffering an NSTEMI at which time a DES was placed to the ostial RCA.  He was not started on a statin at that time due to elevated LFTs.  In subsequent follow-up however, atorvastatin 20 mg daily was initiated, and the patient has been on this for approximately 5 weeks.  During this time.  He has developed progressive weakness of the proximal muscles of both legs and arms to the point that he requires assistance from family to mobilize, and also reports an unintentional 30 pound weight loss.  In the ER CK was found to be 7291.  LFTs were also elevated. ? ?Consultants:  ?Neurology ? ?Code Status: Full Code  ? ?DVT prophylaxis: ?Lovenox ? ?Interim Hx: ?Blood pressure elevated with systolics 737-106.  Vital signs otherwise stable.  Resting comfortably in a bedside chair.  Reports no new complaints but states his symptoms from presentation are not significantly changed.  Reports some pausing in his speech with slight word finding difficulty.  Feels slightly unsteady and remains generally weak. ? ?Assessment & Plan: ? ?Muscle weakness of all 4 extremities - Rhabdomyolysis ?CK 7291 ?- MRI Brain and C spine without contrast ?- Trend CK -presently trending downward rapidly ?- If CK is persistently elevated, may need to do muscle biopsy, myositis panel and steroids inpatient. ?- Follow up with Dr. Narda Amber outpatient for EMG and further evaluation. ?- Further workup pending CK trend. ? ?CAD s/p DES to ostial RCA 12/28/2021 ?-Continue aspirin and Brilinta ?-Continue Coreg ? ?Elevated LFTs ?Persistently elevated LFTs - CT A/P without hepatobiliary abnormality -trend  with hydration and discontinuation of statin ? ?Mild hypokalemia ?Likely simply due to poor intake -supplement and follow -check magnesium ? ?Type 2 diabetes mellitus  ?Continue long-acting insulin plus SSI and follow trend -A1c recently 7.4 ? ?Hyperlipidemia ?Holding statin due to above ? ?6 mm right upper lobe pulmonary nodule ?Noted on scan during this admission and also present on prior exams with radiologist stating stability over time allows [place] to safely consider this to be a benign finding ? ?Family Communication: Spoke with patient and his father at bedside ?Disposition: From home -possible CIR admission depending upon hospital course ? ? ?Objective: ?Blood pressure (!) 184/78, pulse 77, temperature 98.3 ?F (36.8 ?C), temperature source Oral, resp. rate 18, SpO2 97 %. ? ?Intake/Output Summary (Last 24 hours) at 02/03/2022 0744 ?Last data filed at 02/03/2022 0708 ?Gross per 24 hour  ?Intake 1000 ml  ?Output 600 ml  ?Net 400 ml  ? ?There were no vitals filed for this visit. ? ?Examination: ?General: No acute respiratory distress -speech with appreciable but very mild delay and very subtle dysarthria ?Lungs: Clear to auscultation bilaterally without wheezes or crackles ?Cardiovascular: Regular rate and rhythm without murmur gallop or rub normal S1 and S2 ?Abdomen: Nontender, nondistended, soft, bowel sounds positive, no rebound, no ascites, no appreciable mass ?Extremities: No significant cyanosis, clubbing, or edema bilateral lower extremities ? ?CBC: ?Recent Labs  ?Lab 02/02/22 ?1702 02/03/22 ?0221  ?WBC 10.4 10.8*  ?NEUTROABS 6.1  --   ?HGB 14.2 12.7*  ?HCT 42.5 38.9*  ?MCV 96.2 96.8  ?PLT 321 273  ? ?  Basic Metabolic Panel: ?Recent Labs  ?Lab 02/02/22 ?1702 02/03/22 ?0221  ?NA 137 139  ?K 4.0 3.4*  ?CL 103 108  ?CO2 24 26  ?GLUCOSE 274* 117*  ?BUN 16 13  ?CREATININE 0.46* 0.37*  ?CALCIUM 9.6 9.0  ? ?GFR: ?CrCl cannot be calculated (Unknown ideal weight.). ? ?Liver Function Tests: ?Recent Labs  ?Lab  02/02/22 ?1702 02/03/22 ?0221  ?AST 181* 140*  ?ALT 385* 317*  ?ALKPHOS 66 52  ?BILITOT 0.7 0.9  ?PROT 7.1 6.1*  ?ALBUMIN 4.1 3.5  ? ? ?Cardiac Enzymes: ?Recent Labs  ?Lab 02/02/22 ?1702 02/03/22 ?0221  ?CKTOTAL 7,291* 1,940*  ? ? ?HbA1C: ?Hgb A1c MFr Bld  ?Date/Time Value Ref Range Status  ?12/28/2021 02:59 AM 7.4 (H) 4.8 - 5.6 % Final  ?  Comment:  ?  (NOTE) ?        Prediabetes: 5.7 - 6.4 ?        Diabetes: >6.4 ?        Glycemic control for adults with diabetes: <7.0 ?  ?02/12/2021 12:05 PM 9.1 (H) 4.8 - 5.6 % Final  ?  Comment:  ?  (NOTE) ?Pre diabetes:          5.7%-6.4% ? ?Diabetes:              >6.4% ? ?Glycemic control for   <7.0% ?adults with diabetes ?  ? ? ?CBG: ?Recent Labs  ?Lab 02/03/22 ?5284  ?GLUCAP 109*  ? ? ?Scheduled Meds: ? amLODipine  10 mg Oral q morning  ? aspirin EC  81 mg Oral Daily  ? carvedilol  12.5 mg Oral BID WC  ? enoxaparin (LOVENOX) injection  40 mg Subcutaneous Q24H  ? hydrochlorothiazide  12.5 mg Oral Daily  ? insulin aspart  0-5 Units Subcutaneous QHS  ? insulin aspart  0-9 Units Subcutaneous TID WC  ? insulin glargine-yfgn  10 Units Subcutaneous QHS  ? lisinopril  40 mg Oral q morning  ? ticagrelor  90 mg Oral BID  ? ?Continuous Infusions: ? sodium chloride 125 mL/hr at 02/03/22 0045  ? ? ? LOS: 0 days  ? ?Cherene Altes, MD ?[facility] ?Office  [phone] ?Pager - Text Page per Shea Evans ? ?If 7PM-7AM, please contact night-coverage per [place] ?02/03/2022, 7:44 AM ? ? ? ? ?

## 2022-02-03 NOTE — Plan of Care (Signed)

## 2022-02-03 NOTE — Consult Note (Addendum)
NEUROLOGY CONSULTATION NOTE  ? ?Date of service: Feb 03, 2022 ?Patient Name: [name] ?MRN:  [id number] ?DOB:  1955-10-04 ?Reason for consult: "Elevated CK, proximal muscle weakness" ?Requesting Provider: Lenore Cordia, MD ?_ _ _   _ __   _ __ _ _  __ __   _ __   __ _ ? ?History of Present Illness  ?[name] is a 66 y.o. male with PMH significant for DM2, HTN, peripheral neuropathy, CAD s/p DES to RCA in early April 2023 who presents with progressive symmetric proximal muscle weakness along with unintentional 30 pound weight loss over the last few months. ? ?Patient reports that he was strong enough to build a deck for his friend's and lift heavy wood 2 x 12 lumbar boards in November 2022.  Over the last few months, he has had gradually progressive bilateral upper and bilateral lower extremity proximal weakness to the point that he is unable to lift his arms above his shoulder and has significant difficulty with brushing his teeth.  He has to use all 4 extremities to push himself out of a chair when he takes a few attempts sometimes to get him out of a chair.  He has also noted that he has lost significant amount of weight and that is unintentional despite him eating more now and he also reports that his shoulders, upper arms and thighs and hip look leaner and thinner than they used to in the past. ? ?He reports that he was started on atorvastatin about a year or 2 ago but he does not remember the exact date or the exact month.  He reports that on routine screening, he was found to have elevated liver enzymes and was sent to GI and his statins were stopped.  He reports that after his heart stent placement, he was put back on atorvastatin at 20 mg/day and since starting the statins, his weakness has acutely worsened and he has had almost near falls and eventually came to the ED. ? ?He denies any muscle aches. No fever. Eats a good mix of meat, vegetables and dairy.  No urinary urgency or incontinence.  He  can feel when his bladder is full can hold urine for reasonable amount of time and consents when his bladder is completely empty. ?  ?ROS  ? ?Constitutional + for weight loss but no fever and chills.   ?HEENT Denies changes in vision and hearing.   ?Respiratory Denies SOB and cough.   ?CV Denies palpitations and CP   ?GI Denies abdominal pain, nausea, vomiting and diarrhea.   ?GU Denies dysuria and urinary frequency.   ?MSK Denies myalgia and joint pain.   ?Skin Denies rash and pruritus.   ?Neurological Denies headache and syncope.   ?Psychiatric Denies recent changes in mood. Denies anxiety and depression.   ? ?Past History  ? ?Past Medical History:  ?Diagnosis Date  ? Arthritis   ? Diabetes mellitus   ? fasting 120s  ? History of kidney stones   ? Hypertension   ? Peripheral neuropathy   ? left fifth finger, left toe  ? Pneumonia   ? hx of  ? ?Past Surgical History:  ?Procedure Laterality Date  ? APPENDECTOMY    ? CORONARY STENT INTERVENTION N/A 12/28/2021  ? Procedure: CORONARY STENT INTERVENTION;  Surgeon: Jettie Booze, MD;  Location: [facility];  Service: Cardiovascular;  Laterality: N/A;  ? CYSTOSCOPY W/ URETERAL STENT PLACEMENT Bilateral 03/17/2014  ? Procedure:  CYSTOSCOPY WITH RIGHT STENT PULL  ;  Surgeon: Alexis Frock, MD;  Location: [facility];  Service: Urology;  Laterality: Bilateral;  ? CYSTOSCOPY WITH RETROGRADE PYELOGRAM, URETEROSCOPY AND STENT PLACEMENT Bilateral 02/01/2014  ? Procedure: CYSTOSCOPY WITH BILATERAL RETROGRADE CYSTOSCOPY, BILATERAL RETROGRADE, PYELOGRAM, RIGHT URETEROSCOPY, STONE BASKETRY,  AND BILATERAL STENT PLACEMENT;  Surgeon: Alexis Frock, MD;  Location: [facility];  Service: Urology;  Laterality: Bilateral;  ? CYSTOSCOPY WITH URETEROSCOPY AND STENT PLACEMENT Left 02/12/2021  ? Procedure: CYSTOSCOPY WITH LEFT URETEROSCOPY, BALLOON DILITATION, HOLMIUM LASER LITHOTRIPSY AND STENT PLACEMENT;  Surgeon: Lucas Mallow, MD;  Location: [facility];  Service: Urology;  Laterality:  Left;  ? HOLMIUM LASER APPLICATION Left 1/61/0960  ? Procedure: HOLMIUM LASER APPLICATION;  Surgeon: Alexis Frock, MD;  Location: [facility];  Service: Urology;  Laterality: Left;  ? INTRAVASCULAR ULTRASOUND/IVUS N/A 12/28/2021  ? Procedure: Intravascular Ultrasound/IVUS;  Surgeon: Jettie Booze, MD;  Location: [facility];  Service: Cardiovascular;  Laterality: N/A;  ? LEFT HEART CATH AND CORONARY ANGIOGRAPHY N/A 12/28/2021  ? Procedure: LEFT HEART CATH AND CORONARY ANGIOGRAPHY;  Surgeon: Jettie Booze, MD;  Location: [facility];  Service: Cardiovascular;  Laterality: N/A;  ? LITHOTRIPSY    ? NEPHROLITHOTOMY Left 03/17/2014  ? Procedure: NEPHROLITHOTOMY PERCUTANEOUS with access;  Surgeon: Alexis Frock, MD;  Location: [facility];  Service: Urology;  Laterality: Left;  ? NEPHROLITHOTOMY Left 03/19/2014  ? Procedure: 2ND STAGE NEPHROLITHOTOMY PERCUTANEOUS left ureteroscopy with lasr stone treatment and stent placement, with access;  Surgeon: Alexis Frock, MD;  Location: [facility];  Service: Urology;  Laterality: Left;  ? TOTAL KNEE ARTHROPLASTY Right 09/02/2014  ? Procedure: RIGHT TOTAL KNEE ARTHROPLASTY;  Surgeon: Meredith Pel, MD;  Location: [facility];  Service: Orthopedics;  Laterality: Right;  ? uretral stent    ? ?Family History  ?Problem Relation Age of Onset  ? Alzheimer's disease Mother   ? Heart attack Father   ? ?Social History  ? ?Socioeconomic History  ? Marital status: Widowed  ?  Spouse name: Not on file  ? Number of children: Not on file  ? Years of education: Not on file  ? Highest education level: Not on file  ?Occupational History  ? Not on file  ?Tobacco Use  ? Smoking status: Never  ? Smokeless tobacco: Never  ?Vaping Use  ? Vaping Use: Never used  ?Substance and Sexual Activity  ? Alcohol use: Yes  ?  Alcohol/week: 2.0 standard drinks  ?  Types: 2 Cans of beer per week  ?  Comment: occasional/ 1-2 beers week  ? Drug use: No  ? Sexual activity: Yes  ?  Birth control/protection:  Other-see comments  ?Other Topics Concern  ? Not on file  ?Social History Narrative  ? Not on file  ? ?Social Determinants of Health  ? ?Financial Resource Strain: Not on file  ?Food Insecurity: Not on file  ?Transportation Needs: Not on file  ?Physical Activity: Not on file  ?Stress: Not on file  ?Social Connections: Not on file  ? ?No Known Allergies ? ?Medications  ?(Not in a hospital admission) ?  ? ?Vitals  ? ?Vitals:  ? 02/02/22 2100 02/02/22 2130 02/02/22 2200 02/02/22 2300  ?BP: (!) 145/89 (!) 150/78 (!) 153/80 (!) 145/82  ?Pulse: 86 84 86 93  ?Resp: (!) 23 (!) '22 18 20  '$ ?Temp:      ?TempSrc:      ?SpO2: 97% 95% 98% 98%  ?  ? ?There  is no height or weight on file to calculate BMI. ? ?Physical Exam  ? ?General: Laying comfortably in bed; in no acute distress.  ?HENT: Normal oropharynx and mucosa. Normal external appearance of ears and nose.  ?Neck: Supple, no pain or tenderness  ?CV: No JVD. No peripheral edema.  ?Pulmonary: Symmetric Chest rise. Normal respiratory effort.  ?Abdomen: Soft to touch, non-tender.  ?Ext: No cyanosis, edema, or deformity  ?Skin: No rash. Normal palpation of skin.   ?Musculoskeletal: Normal digits and nails by inspection. No clubbing.  ? ?Neurologic Examination  ?Mental status/Cognition: Alert, oriented to self, place, month and year, good attention.  ?Speech/language: Fluent, comprehension intact, object naming intact, repetition intact.  ?Cranial nerves:  ? CN II Pupils equal and reactive to light, no VF deficits   ? CN III,IV,VI EOM intact, no gaze preference or deviation, no nystagmus   ? CN V normal sensation in V1, V2, and V3 segments bilaterally   ? CN VII no asymmetry, no nasolabial fold flattening   ? CN VIII normal hearing to speech   ? CN IX & X normal palatal elevation, no uvular deviation   ? CN XI 5/5 head turn and 5/5 shoulder shrug bilaterally   ? CN XII midline tongue protrusion   ? ?Motor:  ?Muscle bulk: normal, tone normal, pronator drift none tremor none ?Mvmt  Root Nerve  Muscle Right Left Comments  ?SA C5/6 Ax Deltoid 5 5   ?EF C5/6 Mc Biceps 5 5   ?EE C6/7/8 Rad Triceps 5 5   ?WF C6/7 Med FCR     ?WE C7/8 PIN ECU     ?F Ab C8/T1 U ADM/FDI 5 5   ?HF L1/2/3 Fem

## 2022-02-04 ENCOUNTER — Observation Stay (HOSPITAL_BASED_OUTPATIENT_CLINIC_OR_DEPARTMENT_OTHER): Payer: Medicare Other

## 2022-02-04 ENCOUNTER — Observation Stay (HOSPITAL_COMMUNITY): Payer: Medicare Other

## 2022-02-04 DIAGNOSIS — I25810 Atherosclerosis of coronary artery bypass graft(s) without angina pectoris: Secondary | ICD-10-CM | POA: Diagnosis not present

## 2022-02-04 DIAGNOSIS — R911 Solitary pulmonary nodule: Secondary | ICD-10-CM | POA: Diagnosis present

## 2022-02-04 DIAGNOSIS — Z7902 Long term (current) use of antithrombotics/antiplatelets: Secondary | ICD-10-CM | POA: Diagnosis not present

## 2022-02-04 DIAGNOSIS — M1990 Unspecified osteoarthritis, unspecified site: Secondary | ICD-10-CM | POA: Diagnosis present

## 2022-02-04 DIAGNOSIS — R531 Weakness: Secondary | ICD-10-CM | POA: Diagnosis not present

## 2022-02-04 DIAGNOSIS — E1165 Type 2 diabetes mellitus with hyperglycemia: Secondary | ICD-10-CM | POA: Diagnosis not present

## 2022-02-04 DIAGNOSIS — M3320 Polymyositis, organ involvement unspecified: Secondary | ICD-10-CM | POA: Diagnosis present

## 2022-02-04 DIAGNOSIS — Z955 Presence of coronary angioplasty implant and graft: Secondary | ICD-10-CM | POA: Diagnosis not present

## 2022-02-04 DIAGNOSIS — M4802 Spinal stenosis, cervical region: Secondary | ICD-10-CM | POA: Diagnosis present

## 2022-02-04 DIAGNOSIS — R29704 NIHSS score 4: Secondary | ICD-10-CM | POA: Diagnosis not present

## 2022-02-04 DIAGNOSIS — I6329 Cerebral infarction due to unspecified occlusion or stenosis of other precerebral arteries: Secondary | ICD-10-CM | POA: Diagnosis not present

## 2022-02-04 DIAGNOSIS — Z820 Family history of epilepsy and other diseases of the nervous system: Secondary | ICD-10-CM | POA: Diagnosis not present

## 2022-02-04 DIAGNOSIS — M609 Myositis, unspecified: Secondary | ICD-10-CM | POA: Diagnosis not present

## 2022-02-04 DIAGNOSIS — I6389 Other cerebral infarction: Secondary | ICD-10-CM | POA: Diagnosis not present

## 2022-02-04 DIAGNOSIS — I252 Old myocardial infarction: Secondary | ICD-10-CM | POA: Diagnosis not present

## 2022-02-04 DIAGNOSIS — E876 Hypokalemia: Secondary | ICD-10-CM | POA: Diagnosis present

## 2022-02-04 DIAGNOSIS — Z7982 Long term (current) use of aspirin: Secondary | ICD-10-CM | POA: Diagnosis not present

## 2022-02-04 DIAGNOSIS — I10 Essential (primary) hypertension: Secondary | ICD-10-CM | POA: Diagnosis present

## 2022-02-04 DIAGNOSIS — I672 Cerebral atherosclerosis: Secondary | ICD-10-CM | POA: Diagnosis present

## 2022-02-04 DIAGNOSIS — E119 Type 2 diabetes mellitus without complications: Secondary | ICD-10-CM | POA: Diagnosis not present

## 2022-02-04 DIAGNOSIS — T380X5A Adverse effect of glucocorticoids and synthetic analogues, initial encounter: Secondary | ICD-10-CM | POA: Diagnosis not present

## 2022-02-04 DIAGNOSIS — E785 Hyperlipidemia, unspecified: Secondary | ICD-10-CM | POA: Diagnosis present

## 2022-02-04 DIAGNOSIS — Z7984 Long term (current) use of oral hypoglycemic drugs: Secondary | ICD-10-CM | POA: Diagnosis not present

## 2022-02-04 DIAGNOSIS — I639 Cerebral infarction, unspecified: Secondary | ICD-10-CM | POA: Diagnosis not present

## 2022-02-04 DIAGNOSIS — R5381 Other malaise: Secondary | ICD-10-CM | POA: Diagnosis not present

## 2022-02-04 DIAGNOSIS — E1142 Type 2 diabetes mellitus with diabetic polyneuropathy: Secondary | ICD-10-CM | POA: Diagnosis present

## 2022-02-04 DIAGNOSIS — I6350 Cerebral infarction due to unspecified occlusion or stenosis of unspecified cerebral artery: Secondary | ICD-10-CM | POA: Diagnosis not present

## 2022-02-04 DIAGNOSIS — G7249 Other inflammatory and immune myopathies, not elsewhere classified: Secondary | ICD-10-CM | POA: Diagnosis not present

## 2022-02-04 DIAGNOSIS — I6320 Cerebral infarction due to unspecified occlusion or stenosis of unspecified precerebral arteries: Secondary | ICD-10-CM | POA: Diagnosis present

## 2022-02-04 DIAGNOSIS — R748 Abnormal levels of other serum enzymes: Secondary | ICD-10-CM | POA: Diagnosis not present

## 2022-02-04 DIAGNOSIS — Z794 Long term (current) use of insulin: Secondary | ICD-10-CM | POA: Diagnosis not present

## 2022-02-04 DIAGNOSIS — Z79899 Other long term (current) drug therapy: Secondary | ICD-10-CM | POA: Diagnosis not present

## 2022-02-04 DIAGNOSIS — M6281 Muscle weakness (generalized): Secondary | ICD-10-CM | POA: Diagnosis not present

## 2022-02-04 DIAGNOSIS — Z8249 Family history of ischemic heart disease and other diseases of the circulatory system: Secondary | ICD-10-CM | POA: Diagnosis not present

## 2022-02-04 DIAGNOSIS — I2510 Atherosclerotic heart disease of native coronary artery without angina pectoris: Secondary | ICD-10-CM | POA: Diagnosis present

## 2022-02-04 LAB — ECHOCARDIOGRAM LIMITED BUBBLE STUDY
AV Mean grad: 11 mmHg
AV Peak grad: 19.9 mmHg
Ao pk vel: 2.23 m/s
Area-P 1/2: 3.48 cm2

## 2022-02-04 LAB — BASIC METABOLIC PANEL
Anion gap: 4 — ABNORMAL LOW (ref 5–15)
BUN: 15 mg/dL (ref 8–23)
CO2: 30 mmol/L (ref 22–32)
Calcium: 9.4 mg/dL (ref 8.9–10.3)
Chloride: 105 mmol/L (ref 98–111)
Creatinine, Ser: 0.46 mg/dL — ABNORMAL LOW (ref 0.61–1.24)
GFR, Estimated: >60 mL/min (ref >60)
Glucose, Bld: 111 mg/dL — ABNORMAL HIGH (ref 70–99)
Potassium: 3.8 mmol/L (ref 3.5–5.1)
Sodium: 139 mmol/L (ref 135–145)

## 2022-02-04 LAB — GLUCOSE, CAPILLARY
Glucose-Capillary: 111 mg/dL — ABNORMAL HIGH (ref 70–99)
Glucose-Capillary: 159 mg/dL — ABNORMAL HIGH (ref 70–99)
Glucose-Capillary: 200 mg/dL — ABNORMAL HIGH (ref 70–99)
Glucose-Capillary: 238 mg/dL — ABNORMAL HIGH (ref 70–99)

## 2022-02-04 LAB — TSH: TSH: 1.232 u[IU]/mL (ref 0.350–4.500)

## 2022-02-04 LAB — MAGNESIUM: Magnesium: 1.9 mg/dL (ref 1.7–2.4)

## 2022-02-04 LAB — MYOGLOBIN, URINE: Myoglobin, Ur: <2 ng/mL (ref 0–13)

## 2022-02-04 LAB — CK: Total CK: 5461 U/L — ABNORMAL HIGH (ref 49–397)

## 2022-02-04 IMAGING — CT CT ANGIO HEAD-NECK (W OR W/O PERF)
1 of 11 series · 5 of 33 positions shown · non-contrast
Comparison: Brain MRI from yesterday

CLINICAL DATA: Symmetric proximal weakness of the bilateral upper
and lower extremities. Infarct by MRI.

EXAM:
CT ANGIOGRAPHY HEAD AND NECK
TECHNIQUE: Multidetector CT imaging of the head and neck was performed using
the standard protocol during bolus administration of intravenous
contrast. Multiplanar CT image reconstructions and MIPs were
obtained to evaluate the vascular anatomy. Carotid stenosis
measurements (when applicable) are obtained utilizing NASCET
criteria, using the distal internal carotid diameter as the
denominator.

[Series 9: cta neck axial · axial · 0.32mm/px · z∈[-296,-68]mm · 5 of 344 slices shown]
[im 58/344  soft-tissue]
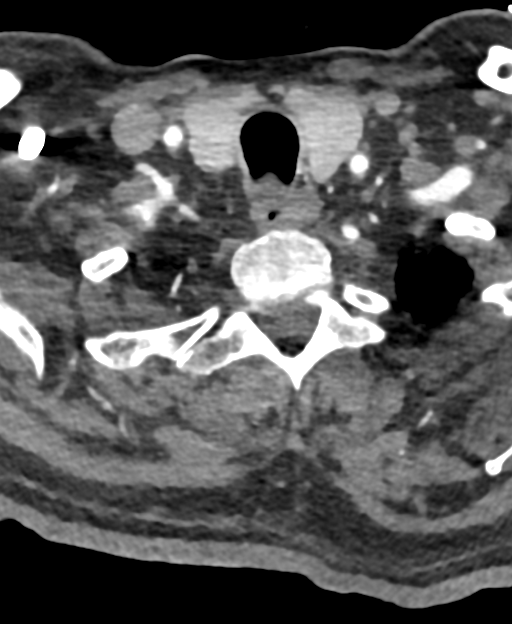
[im 115/344  bone]
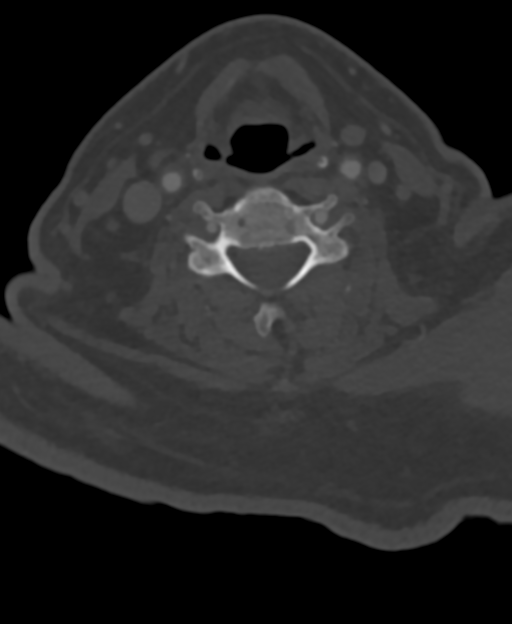
[im 172/344  soft-tissue]
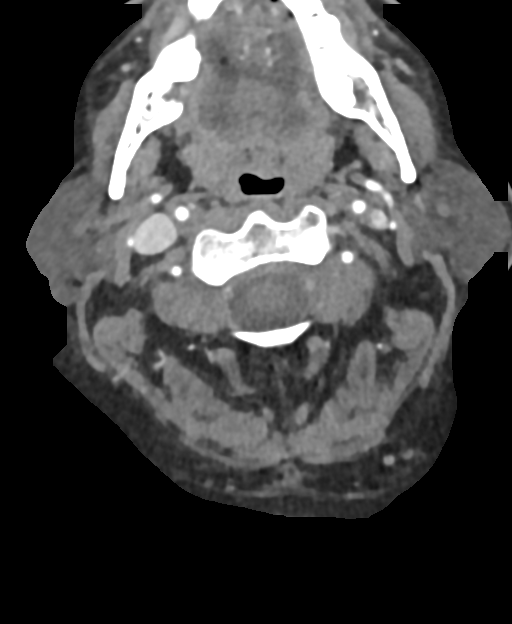
[im 229/344  bone]
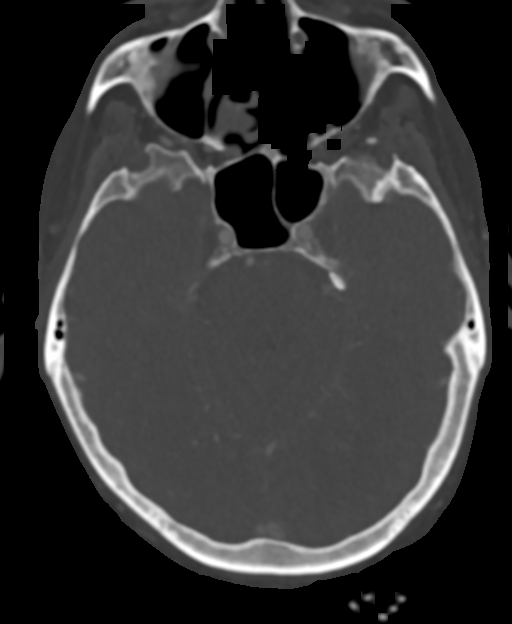
[im 286/344  soft-tissue]
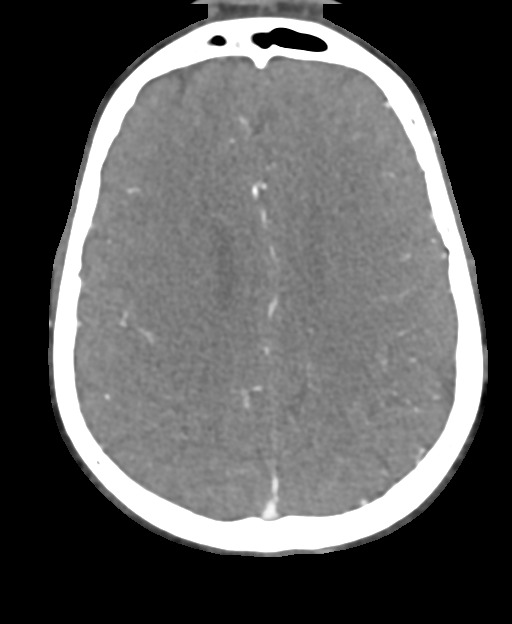

[5 of 33 positions shown; findings below may reference images not displayed]

RADIATION DOSE REDUCTION: This exam was performed according to the
departmental dose-optimization program which includes automated
exposure control, adjustment of the mA and/or kV according to
patient size and/or use of iterative reconstruction technique.

CONTRAST:  75mL OMNIPAQUE IOHEXOL 350 MG/ML SOLN
FINDINGS: CT HEAD FINDINGS

Brain: Known acute right pontine infarct is largely obscured by
skull base artifact. No evidence of interval infarct, hemorrhage, or
hydrocephalus.

Vascular: See below

Skull: Negative

Sinuses: Negative

Orbits: Negative

Review of the MIP images confirms the above findings

CTA NECK FINDINGS

Aortic arch: Atheromatous plaque with 2 vessel branching.

Right carotid system: Low-density atheromatous wall thickening of
the common carotid with moderate calcified plaque at the bifurcation
and proximal ICA. Proximal ICA stenosis measures 70%. No ulceration
or downstream beading.

Left carotid system: Atheromatous wall thickening of the common
carotid with mixed density plaque at the bifurcation and proximal
ICA. Proximal ICA stenosis measures 50%. No ulceration.

Vertebral arteries: Proximal subclavian atherosclerosis, greater on
the left without flow limiting stenosis. Calcified plaque at the
left vertebral origin causing 40 % stenosis. Calcified plaque at the
right vertebral origin with 50% stenosis.

Skeleton: Multilevel cervical disc and facet degeneration. No acute
or aggressive finding.

Other neck: No acute finding

Upper chest: No acute finding

Review of the MIP images confirms the above findings

CTA HEAD FINDINGS

Anterior circulation: Atheromatous calcification along the carotid
siphons with focal right paraclinoid ICA stenosis measuring at least
70%. No branch occlusion, beading, or aneurysm. Hypoplastic left A1
segment with probable superimposed origin stenosis.

Posterior circulation: The vertebral and basilar arteries are
diffusely patent. Mild basilar stenosis. Moderate bilateral PCA
stenosis, P2 segment on the left and P3 segment on the right.

Venous sinuses: Negative

Anatomic variants: None significant

Review of the MIP images confirms the above findings
IMPRESSION: 1. No emergent finding.  Extensive atherosclerosis.
2. Right ICA stenosis measuring at least 70% proximally and 70% at
the paraclinoid segment.
3. 50% proximal left ICA stenosis.
4. Bilateral vertebral origin stenosis measuring 50% on the right
and 40% on the left.

## 2022-02-04 MED ORDER — IOHEXOL 350 MG/ML SOLN
75.0000 mL | Freq: Once | INTRAVENOUS | Status: AC | PRN
  Administered 2022-02-04: 75 mL via INTRAVENOUS

## 2022-02-04 NOTE — Evaluation (Signed)
Speech Language Pathology Evaluation ?Patient Details ?Name: [name] ?MRN: [id number] ?DOB: Feb 05, 1956 ?Today's Date: 02/04/2022 ?Time: 1051-1105 ?SLP Time Calculation (min) (ACUTE ONLY): 14 min ? ?Problem List:  ?Patient Active Problem List  ? Diagnosis Date Noted  ? Elevated CK 02/02/2022  ? Elevated LFTs 02/02/2022  ? CAD (coronary artery disease) 02/02/2022  ? Muscle weakness of all 4 extremities 02/02/2022  ? Hyperlipidemia 01/28/2022  ? NSTEMI (non-ST elevated myocardial infarction) ([place]) 12/27/2021  ? Arthritis of knee 09/02/2014  ? 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 Nephrolithiasis 02/01/2014  ? Pneumonia 08/27/2011  ? Type 2 diabetes mellitus ([place]) 08/27/2011  ? ?Past Medical History:  ?Past Medical History:  ?Diagnosis Date  ? Arthritis   ? Diabetes mellitus   ? fasting 120s  ? History of kidney stones   ? Hypertension   ? Peripheral neuropathy   ? left fifth finger, left toe  ? Pneumonia   ? hx of  ? ?Past Surgical History:  ?Past Surgical History:  ?Procedure Laterality Date  ? APPENDECTOMY    ? CORONARY STENT INTERVENTION N/A 12/28/2021  ? Procedure: CORONARY STENT INTERVENTION;  Surgeon: Jettie Booze, MD;  Location: [facility];  Service: Cardiovascular;  Laterality: N/A;  ? CYSTOSCOPY W/ URETERAL STENT PLACEMENT Bilateral 03/17/2014  ? Procedure: CYSTOSCOPY WITH RIGHT STENT PULL  ;  Surgeon: Alexis Frock, MD;  Location: [facility];  Service: Urology;  Laterality: Bilateral;  ? CYSTOSCOPY WITH RETROGRADE PYELOGRAM, URETEROSCOPY AND STENT PLACEMENT Bilateral 02/01/2014  ? Procedure: CYSTOSCOPY WITH BILATERAL RETROGRADE CYSTOSCOPY, BILATERAL RETROGRADE, PYELOGRAM, RIGHT URETEROSCOPY, STONE BASKETRY,  AND BILATERAL STENT PLACEMENT;  Surgeon: Alexis Frock, MD;  Location: [facility];  Service: Urology;  Laterality: Bilateral;  ? CYSTOSCOPY WITH URETEROSCOPY AND STENT PLACEMENT Left 02/12/2021  ? Procedure: CYSTOSCOPY WITH LEFT URETEROSCOPY, BALLOON DILITATION, HOLMIUM LASER LITHOTRIPSY AND STENT  PLACEMENT;  Surgeon: Lucas Mallow, MD;  Location: [facility];  Service: Urology;  Laterality: Left;  ? HOLMIUM LASER APPLICATION Left 2/94/7654  ? Procedure: HOLMIUM LASER APPLICATION;  Surgeon: Alexis Frock, MD;  Location: [facility];  Service: Urology;  Laterality: Left;  ? INTRAVASCULAR ULTRASOUND/IVUS N/A 12/28/2021  ? Procedure: Intravascular Ultrasound/IVUS;  Surgeon: Jettie Booze, MD;  Location: [facility];  Service: Cardiovascular;  Laterality: N/A;  ? LEFT HEART CATH AND CORONARY ANGIOGRAPHY N/A 12/28/2021  ? Procedure: LEFT HEART CATH AND CORONARY ANGIOGRAPHY;  Surgeon: Jettie Booze, MD;  Location: [facility];  Service: Cardiovascular;  Laterality: N/A;  ? LITHOTRIPSY    ? NEPHROLITHOTOMY Left 03/17/2014  ? Procedure: NEPHROLITHOTOMY PERCUTANEOUS with access;  Surgeon: Alexis Frock, MD;  Location: [facility];  Service: Urology;  Laterality: Left;  ? NEPHROLITHOTOMY Left 03/19/2014  ? Procedure: 2ND STAGE NEPHROLITHOTOMY PERCUTANEOUS left ureteroscopy with lasr stone treatment and stent placement, with access;  Surgeon: Alexis Frock, MD;  Location: [facility];  Service: Urology;  Laterality: Left;  ? TOTAL KNEE ARTHROPLASTY Right 09/02/2014  ? Procedure: RIGHT TOTAL KNEE ARTHROPLASTY;  Surgeon: Meredith Pel, MD;  Location: [facility];  Service: Orthopedics;  Laterality: Right;  ? uretral stent    ? ?HPI:  ?Pt is a 66 y/o male who presented to the ED with progressive UE and LE weakness. MRI 5/11: Acute right pontine infarct. PMH: DM, CAD, HTN, and, MI.  ? ?Assessment / Plan / Recommendation ?Clinical Impression ? Pt participated in speech-language-cognition evaluation evaluation. 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  Pt denied any baseline or acute deficits. [facility] Mental Status Examination was completed to evaluate the pt's cognitive-linguistic skills. He achieved a  score of 29/30 which is within the normal limits of 27 or more out of 30. No speech or language deficits were noted and his performance on informal cognitive-linguistic tasks was also within functional limits. Further skilled SLP services are not clinically indicated at this time. Pt was educated regarding this and he verbalized understanding as well as agreement with plan of care. ?   ?SLP Assessment ? SLP Recommendation/Assessment: Patient does not need any further [place] [facility] Services ?SLP Visit Diagnosis: Cognitive communication deficit (R41.841)  ?  ?Recommendations for follow up therapy are one component of a multi-disciplinary discharge planning process, led by the attending physician.  Recommendations may be updated based on patient status, additional functional criteria and insurance authorization. ?   ?Follow Up Recommendations ? No SLP follow up  ?  ?Assistance Recommended at Discharge ? None  ?Functional Status Assessment Patient has not had a recent decline in their functional status  ?Frequency and Duration    ?  ?  ?   ?SLP Evaluation ?Cognition ? Overall Cognitive Status: Within Functional Limits for tasks assessed ?Arousal/Alertness: Awake/alert ?Orientation Level: Oriented X4 ?Year: 2023 ?Month: May ?Day of Week: Correct ?Attention: Focused;Sustained;Selective ?Focused Attention: Appears intact ?Sustained Attention: Appears intact ?Selective Attention: Appears intact ?Memory: Appears intact (Immediate: 5/5; delayed: 5/5) ?Awareness: Appears intact ?Problem Solving: Appears intact (Money: 3/3; time: 1/1) ?Executive Function: Sequencing;Organizing ?Sequencing: Appears intact (Clock drawing: 4/4) ?Organizing: Impaired (Backward digit span: 1/2; 2/2 with repetition)  ?  ?   ?Comprehension ? Auditory Comprehension ?Overall Auditory Comprehension: Appears within functional limits for tasks assessed ?Yes/No Questions: Within Functional Limits ?Commands: Within Functional  Limits ?Conversation: Complex  ?  ?Expression Expression ?Primary Mode of Expression: Verbal ?Verbal Expression ?Overall Verbal Expression: Appears within functional limits for tasks assessed ?Initiation: No impairment ?Level of Generative/Spontaneous Verbalization: Conversation ?Repetition: No impairment ?Naming: No impairment ?Pragmatics: No impairment ?Interfering Components: Attention ?Written Expression ?Dominant Hand: Left   ?Oral / Motor ? Oral Motor/Sensory Function ?Overall Oral Motor/Sensory Function: Within functional limits ?Motor Speech ?Overall Motor Speech: Appears within functional limits for tasks assessed ?Respiration: Within functional limits ?Phonation: Normal ?Resonance: Within functional limits ?Articulation: Within functional limitis ?Intelligibility: Intelligible ?Motor Planning: Witnin functional limits   ?        ?Derrel Moore I. Hardin Negus, [place], CCC-SLP ?Acute Rehabilitation Services ?Office number [phone] ?Pager [phone] ? ?Horton Marshall ?02/04/2022, 11:08 AM ? ? ? ?

## 2022-02-04 NOTE — Progress Notes (Signed)
Subjective: ?Continues to complain of proximal muscle weakness. ? ?He initially went on statins approximately 2 years ago, and then about a year ago came off of them because of elevated LFTs.  He did not have any weakness at that time. ? ?His weakness began 2 to 3 months ago, prior to reinitiation of his statin.  He has noticed a precipitous decline over the past month since starting it, however. ? ?He has noticed himself being unsteady, but attributed this to the weakness. ? ?Exam: ?Vitals:  ? 02/04/22 0744 02/04/22 1147  ?BP: 136/66   ?Pulse: 72   ?Resp: 18   ?Temp: 97.7 ?F (36.5 ?C) 97.9 ?F (36.6 ?C)  ?SpO2: 96% 94%  ? ?Gen: In bed, NAD ?Resp: non-labored breathing, no acute distress ?Abd: soft, nt ? ?Neuro: ?MS: Awake, alert, interactive and appropriate ?CN: EOMI, visual fields full, face symmetric ?Motor: He has 3/5 proximal weakness and 4+/5 distal in both the arms, ?Sensory: Intact to light touch and temperature. ?DTR: He has diminished reflexes in the upper extremities at the biceps and brachioradialis, 2+ and symmetric at the knees and ankles. ? ?Pertinent Labs: ?LDL 97 ?ESR 20 ?CRP 1.4 ? ?Impression: 66 year old male with bilaterally symmetric, progressive proximal >> distal muscle weakness.  He has no sensory symptoms to suggest a neuropathic etiology, and the preservation of reflexes distally would argue against this as well.  The fact that his symptoms were there prior to reinitiation of statin therapy makes me think that anti-HMGCOA antibodies are less likely, though the worsening after reintroduction is concerning. ? ?He does not appear to be symptomatic from the small brainstem stroke seen on MRI.  I suspect that this is incidental, and appears to be a small vessel stroke.  Management would be very similar to secondary heart attack prevention. ? ?Recommendations: ?1) Solumedrol 1g daily x 3 days, followed by oral steroids.  ?2) muscle biopsy (I have discussed with Saverio Danker of general surgery,  this cannot be done over the weekend.  She will add to the list to be seen early next week) ?3) HMG CoA reductase antibodies ?4) PT/OT/ST ?5) continue aspirin and Brilinta for secondary stroke prevention ?6) he will likely need alternative cholesterol management options ?7) DM management with goal A1c less than seven ? ?Roland Rack, MD ?[facility] ?[phone] ? ?If 7pm- 7am, please page neurology on call as listed in [place]. ? ?

## 2022-02-04 NOTE — Progress Notes (Signed)
? DANZIG MACGREGOR  CHE:[id number] DOB: 29-May-1956 DOA: 02/02/2022 ?PCP: Greig Right, MD   ? ?Brief Narrative:  ?66yo with a history of CAD status post DES to ostial RCA 12/28/2021, DM2, and HTN who presented to the ED with progressive weakness in both lower extremities and upper extremities.  He had recently been discharged from the hospital 12/29/2021 after suffering an NSTEMI at which time a DES was placed to the ostial RCA.  He was not started on a statin at that time due to elevated LFTs.  In subsequent follow-up however, atorvastatin 20 mg daily was initiated, and the patient has been on this for approximately 5 weeks.  During this time, he has developed progressive weakness of the proximal muscles of both legs and arms to the point that he requires assistance from family to mobilize, and also reports an unintentional 30 pound weight loss.  In the ER CK was found to be 7291.  LFTs were also elevated. ? ?Consultants:  ?Neurology ? ?Code Status: Full Code  ? ?DVT prophylaxis: ?Lovenox ? ?Interim Hx: ?Afebrile.  Vital signs stable.  CK appears to be fluctuating. No new complaints from patient. Denies cp, n/v, or abdom pain. Weakness persists.  ? ?Assessment & Plan: ? 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 Leonel Ramsay with Neuro to guide workup  ?- Follow up with Dr. Narda Amber outpatient for EMG and further evaluation ? ?Acute right pontine CVA ?-MRI brain noted acute infarct R pons ?-CTa head/neck notes extensive atherosclerosis w/ at least 70% R ICA stenosis and 50% proximal L ICA stenosis as well as B vertebral origin stenosis of 50% on R and 40% on L ?-Continue ASA + Brilinta ?-A1c 7.4  ?-LDL 191 (12/28/21) and 98 now (due to statin) ?-Neuro feels this is an incidental finding and not related to this presentation  ? ?Cervical spine neuroforaminal stenosis ?Noted on MRI cervical spine - MRI  of thoracic and lumbar spine w/o acute findings that fit the clinical situation  ? ?CAD s/p DES to ostial RCA 12/28/2021 ?-Continue aspirin and Brilinta ?-Continue Coreg ? ?Elevated LFTs ?Persistently elevated LFTs - CT A/P without hepatobiliary abnormality -trend with hydration and discontinuation of statin ? ?Mild hypokalemia ?Likely simply due to poor intake -corrected with supplementation -magnesium is normal ? ?Type 2 diabetes mellitus  ?Continue long-acting insulin plus SSI and follow trend -A1c recently 7.4 -CBG well controlled at present ? ?Hyperlipidemia ?Holding statin due to above ? ?6 mm right upper lobe pulmonary nodule ?Noted on scan during this admission and also present on prior exams with radiologist stating stability over time allows [place] to safely consider this to be a benign finding ? ?Family Communication: spoke w/ patient and father at bedside  ?Disposition: From home - possible CIR admission depending upon hospital course ? ?Objective: ?Blood pressure 136/66, pulse 72, temperature 97.7 ?F (36.5 ?C), temperature source Oral, resp. rate 18, SpO2 96 %. ? ?Intake/Output Summary (Last 24 hours) at 02/04/2022 0840 ?Last data filed at 02/04/2022 0455 ?Gross per 24 hour  ?Intake --  ?Output 550 ml  ?Net -550 ml  ? ? ?There were no vitals filed for this visit. ? ?Examination: ?General: No acute respiratory distress - alert and oriented  ?Lungs: Clear to auscultation bilaterally - no wheeze  ?Cardiovascular: RRR - no M or rub  ?Abdomen: Nontender, nondistended, soft, bowel sounds positive, no rebound ?Extremities:  No significant cyanosis, clubbing, or edema B LE  ? ?CBC: ?Recent Labs  ?Lab 02/02/22 ?1702 02/03/22 ?0221  ?WBC 10.4 10.8*  ?NEUTROABS 6.1  --   ?HGB 14.2 12.7*  ?HCT 42.5 38.9*  ?MCV 96.2 96.8  ?PLT 321 273  ? ? ?Basic Metabolic Panel: ?Recent Labs  ?Lab 02/02/22 ?1702 02/03/22 ?0221 02/04/22 ?0242  ?NA 137 139 139  ?K 4.0 3.4* 3.8  ?CL 103 108 105  ?CO2 '24 26 30  '$ ?GLUCOSE 274* 117* 111*  ?BUN '16  13 15  '$ ?CREATININE 0.46* 0.37* 0.46*  ?CALCIUM 9.6 9.0 9.4  ?MG  --   --  1.9  ? ? ?GFR: ?CrCl cannot be calculated (Unknown ideal weight.). ? ?Liver Function Tests: ?Recent Labs  ?Lab 02/02/22 ?1702 02/03/22 ?0221  ?AST 181* 140*  ?ALT 385* 317*  ?ALKPHOS 66 52  ?BILITOT 0.7 0.9  ?PROT 7.1 6.1*  ?ALBUMIN 4.1 3.5  ? ? ? ?Cardiac Enzymes: ?Recent Labs  ?Lab 02/02/22 ?1702 02/03/22 ?0221 02/04/22 ?0242  ?CKTOTAL 7,291* 1,940* 5,461*  ? ? ?Scheduled Meds: ?  stroke: early stages of recovery book   Does not apply Once  ? amLODipine  10 mg Oral q morning  ? aspirin EC  81 mg Oral Daily  ? carvedilol  12.5 mg Oral BID WC  ? cholecalciferol  1,000 Units Oral Daily  ? citalopram  20 mg Oral Daily  ? cyclobenzaprine  10 mg Oral QHS  ? enoxaparin (LOVENOX) injection  40 mg Subcutaneous Q24H  ? insulin aspart  0-5 Units Subcutaneous QHS  ? insulin aspart  0-9 Units Subcutaneous TID WC  ? insulin glargine-yfgn  10 Units Subcutaneous QHS  ? lisinopril  40 mg Oral q morning  ? multivitamin with minerals  1 tablet Oral Daily  ? potassium chloride  40 mEq Oral Daily  ? ticagrelor  90 mg Oral BID  ? ?Continuous Infusions: ? sodium chloride 75 mL/hr at 02/03/22 1702  ? ? ? LOS: 0 days  ? ?Cherene Altes, MD ?[facility] ?Office  [phone] ?Pager - Text Page per Shea Evans ? ?If 7PM-7AM, please contact night-coverage per [place] ?02/04/2022, 8:40 AM ? ? ? ? ?

## 2022-02-04 NOTE — Progress Notes (Signed)
Physical Therapy Treatment ?Patient Details ?Name: [name] ?MRN: [id number] ?DOB: May 23, 1956 ?Today's Date: 02/04/2022 ? ? ?History of Present Illness Pt is a 66 y/o male admitted secondary to progressive UE and LE weakness. Workup pending, however, differential including myositis from statin use. MRI showed acute R pontine infarct. PMH includes DM, CAD, HTN, and, MI. ? ?  ?PT Comments  ? ? Patient received in bed, he is very pleasant and motivated to get out of bed and do activity. Patient continues to have weakness in all extremities. He required assist to get to sitting up in bed and bring LEs off bed. Min A to stand from bed. Min guard for ambulation when using RW. He will continue to benefit from skilled PT to improve strength and independence to return to prior functional level.  ?     ?Recommendations for follow up therapy are one component of a multi-disciplinary discharge planning process, led by the attending physician.  Recommendations may be updated based on patient status, additional functional criteria and insurance authorization. ? ?Follow Up Recommendations ? Acute inpatient rehab (3hours/day) ?  ?  ?Assistance Recommended at Discharge Intermittent Supervision/Assistance  ?Patient can return home with the following A little help with walking and/or transfers;A little help with bathing/dressing/bathroom;Assistance with cooking/housework;Help with stairs or ramp for entrance;Assist for transportation ?  ?Equipment Recommendations ? Rolling walker (2 wheels)  ?  ?Recommendations for Other Services   ? ? ?  ?Precautions / Restrictions Precautions ?Precautions: Fall ?Restrictions ?Weight Bearing Restrictions: No  ?  ? ?Mobility ? Bed Mobility ?Overal bed mobility: Needs Assistance ?Bed Mobility: Supine to Sit ?  ?  ?Supine to sit: Min assist, HOB elevated ?  ?  ?General bed mobility comments: Min A to push trunk into sitting ?  ? ?Transfers ?Overall transfer level: Needs assistance ?Equipment used:  Rolling walker (2 wheels) ?Transfers: Sit to/from Stand ?Sit to Stand: Min assist ?  ?  ?  ?  ?  ?General transfer comment: Mild posterior LOB upon standing. able to stabilize after moving away from bed ?  ? ?Ambulation/Gait ?Ambulation/Gait assistance: Min guard ?Gait Distance (Feet): 150 Feet ?Assistive device: Rolling walker (2 wheels) ?Gait Pattern/deviations: Step-through pattern ?Gait velocity: decr ?  ?  ?General Gait Details: Patient ambulating well with RW. Improved stability and safety. he reports weakness in B LE and UEs. ? ? ?Stairs ?  ?  ?  ?  ?  ? ? ?Wheelchair Mobility ?  ? ?Modified Rankin (Stroke Patients Only) ?Modified Rankin (Stroke Patients Only) ?Pre-Morbid Rankin Score: No symptoms ?Modified Rankin: Moderately severe disability ? ? ?  ?Balance Overall balance assessment: Needs assistance ?Sitting-balance support: Feet supported ?Sitting balance-Leahy Scale: Good ?Sitting balance - Comments: Able to lean down, get shoes and socks and don socks and shoes independently. ?  ?Standing balance support: During functional activity, Bilateral upper extremity supported, Reliant on assistive device for balance ?Standing balance-Leahy Scale: Fair ?Standing balance comment: Reliant on UE ?  ?  ?  ?  ?  ?  ?  ?  ?  ?  ?  ?  ? ?  ?Cognition Arousal/Alertness: Awake/alert ?Behavior During Therapy: [facility] for tasks assessed/performed ?Overall Cognitive Status: Within Functional Limits for tasks assessed ?  ?  ?  ?  ?  ?  ?  ?  ?  ?  ?  ?  ?  ?  ?  ?  ?  ?  ?  ? ?  ?Exercises   ? ?  ?  General Comments   ?  ?  ? ?Pertinent Vitals/Pain Pain Assessment ?Pain Assessment: No/denies pain  ? ? ?Home Living   ?  ?Available Help at Discharge: Family ?Type of Home: House ?  ?  ?  ?  ?  ?  ?   ?  ?Prior Function    ?  ?  ?   ? ?PT Goals (current goals can now be found in the care plan section) Acute Rehab PT Goals ?Patient Stated Goal: to be independent ?PT Goal Formulation: With patient ?Time For Goal Achievement:  02/17/22 ?Potential to Achieve Goals: Good ?Progress towards PT goals: Progressing toward goals ? ?  ?Frequency ? ? ? Min 3X/week ? ? ? ?  ?PT Plan Current plan remains appropriate  ? ? ?Co-evaluation   ?  ?  ?  ?  ? ?  ?AM-PAC PT "6 Clicks" Mobility   ?Outcome Measure ? Help needed turning from your back to your side while in a flat bed without using bedrails?: A Little ?Help needed moving from lying on your back to sitting on the side of a flat bed without using bedrails?: A Little ?Help needed moving to and from a bed to a chair (including a wheelchair)?: A Little ?Help needed standing up from a chair using your arms (e.g., wheelchair or bedside chair)?: A Little ?Help needed to walk in hospital room?: A Little ?Help needed climbing 3-5 steps with a railing? : A Little ?6 Click Score: 18 ? ?  ?End of Session   ?Activity Tolerance: Patient tolerated treatment well ?Patient left: in chair;with call bell/phone within reach;with chair alarm set ?Nurse Communication: Mobility status ?PT Visit Diagnosis: Unsteadiness on feet (R26.81);Muscle weakness (generalized) (M62.81);Other abnormalities of gait and mobility (R26.89) ?  ? ? ?Time: 3846-6599 ?PT Time Calculation (min) (ACUTE ONLY): 25 min ? ?Charges:  $Gait Training: 23-37 mins          ?          ? ?Amanda Cockayne, PT, GCS ?02/04/22,11:42 AM ? ?

## 2022-02-04 NOTE — PMR Pre-admission (Shared)
PMR Admission Coordinator Pre-Admission Assessment ? ?Patient: [name] is an 66 y.o., male ?MRN: [id number] ?DOB: 11-28-1955 ?Height:   ?Weight:   ? ?Insurance Information ?HMO:     PPO: Yes     PCP:      IPA:      80/20:      OTHER:  ?PRIMARY: Blue Medicare       Policy#: [id number]      Subscriber: pt ?CM Name: Larene Beach      Phone#: [phone]     Fax#: [phone] ? I received approval for CIR admit from [facility] with [facility] on 02/07/22 ?Pre-Cert#: [id number]       Employer:  ?Benefits:  Phone #:      Name:  ?Irene Shipper Date: 12/25/2021 - still active ?Deductible: does not have one ?OOP Max: $5,650 ($157.69 met) ?CIR: $335/day co-pay for days 1-6 ?SNF: $0.00 Copayment per day for days 1-20; $196 Copayment per day for days 21-60; $0.00 copayment for days 61-100 Maximum of 100 days/benefit period ?Outpatient:  $10 copay/visit ?Home Health:  100% coverage ?DME: 80% coverage, 20% co-insurance ?Providers: in network  ?SECONDARY:       Policy#:      Phone#:  ? ? ?Financial Counselor:      Phone#:  ? ?The ?Data Collection Information Summary? for patients in Inpatient Rehabilitation Facilities with attached ?Privacy Act [place] Records? was provided and verbally reviewed with: Patient ? ?Emergency Contact Information ?Contact Information   ? ? Name Relation Home Work Mobile  ? Mervin Hack Daughter [phone]    ? JAMES,ADAM Relative   [phone]  ? ?  ? ? ?Current Medical History  ?Patient Admitting Diagnosis: CVA ?History of Present Illness: [name] is a 66 y.o. male with medical history significant for CAD s/p DES to ostial RCA (12/28/2021), T2DM, HTN who presented to the ED 5/10/23for evaluation of progressive weakness in his extremities. ?Patient recently admitted 12/27/2021-12/29/2021 for NSTEMI.  He underwent cardiac catheterization on 12/28/2021 with placement of DES to distal RCA.  He was started on DAPT with aspirin and Brilinta.  Of note he was not started on statin at the time  due to elevated LFTs. Initial vitals showed BP 170/92, pulse 90, RR 22, temp 98.3 ?F, SPO2 97% on room air. Labs show CK 7291, CRP 1.4, ESR 20, troponin 28 > 29, AST 181, ALT 385, alk phos 66, total bilirubin 0.7, BUN 16, creatinine 0.46, serum glucose 274, sodium 137, potassium 4.0, bicarb 24. Urinalysis and urine myoglobin ordered and pending collection. ?2 view chest x-ray negative for focal consolidation, edema, effusion.  Chronic interstitial lung markings noted.  CT chest/abdomen/pelvis with contrast negative for evidence of metastatic disease.  Extensive coronary artery calcification noted.  Stable 6 mm right upper lobe pulmonary nodule noted.  Moderate nonobstructing left nephrolithiasis seen.Patient was given 1 L LR.  Neurology were consulted.  The hospitalist service was consulted to admit for further evaluation and management.PT and OT saw Pt. And recommended CIR to assist return to PLOF.  ?  ?He denies any recent chest pain, dyspnea, nausea, vomiting, abdominal pain. ?Complete NIHSS TOTAL: 4 ? ?Patient's medical record from [facility] has been reviewed by the rehabilitation admission coordinator and physician. ? ?Past Medical History  ?Past Medical History:  ?Diagnosis Date  ? Arthritis   ? Diabetes mellitus   ? fasting 120s  ? History of kidney stones   ? Hypertension   ? Peripheral neuropathy   ? left fifth finger, left  toe  ? Pneumonia   ? hx of  ? ? ?Has the patient had major surgery during 100 days prior to admission? Yes ? ?Family History   ?family history includes Alzheimer's disease in his mother; Heart attack in his father. ? ?Current Medications ? ?Current Facility-Administered Medications:  ?   stroke: early stages of recovery book, , Does not apply, Once, Cherene Altes, MD ?  0.9 %  sodium chloride infusion, , Intravenous, Continuous, Cherene Altes, MD, Last Rate: 75 mL/hr at 02/03/22 1702, New Bag at 02/03/22 1702 ?  amLODipine (NORVASC) tablet 10 mg, 10 mg, Oral,  q morning, Zada Finders R, MD, 10 mg at 02/04/22 0955 ?  aspirin EC tablet 81 mg, 81 mg, Oral, Daily, Lenore Cordia, MD, 81 mg at 02/04/22 0956 ?  carvedilol (COREG) tablet 12.5 mg, 12.5 mg, Oral, BID WC, Zada Finders R, MD, 12.5 mg at 02/04/22 0956 ?  cholecalciferol (VITAMIN D3) tablet 1,000 Units, 1,000 Units, Oral, Daily, Cherene Altes, MD, 1,000 Units at 02/04/22 [phone] ?  citalopram (CELEXA) tablet 20 mg, 20 mg, Oral, Daily, Cherene Altes, MD, 20 mg at 02/04/22 0956 ?  cyclobenzaprine (FLEXERIL) tablet 10 mg, 10 mg, Oral, QHS, Cherene Altes, MD, 10 mg at 02/03/22 2216 ?  enoxaparin (LOVENOX) injection 40 mg, 40 mg, Subcutaneous, Q24H, Zada Finders R, MD, 40 mg at 02/04/22 0957 ?  insulin aspart (novoLOG) injection 0-5 Units, 0-5 Units, Subcutaneous, QHS, Lenore Cordia, MD, 2 Units at 02/03/22 2218 ?  insulin aspart (novoLOG) injection 0-9 Units, 0-9 Units, Subcutaneous, TID WC, Lenore Cordia, MD, 2 Units at 02/04/22 1304 ?  insulin glargine-yfgn (SEMGLEE) injection 10 Units, 10 Units, Subcutaneous, QHS, Lenore Cordia, MD, 10 Units at 02/03/22 2216 ?  lisinopril (ZESTRIL) tablet 40 mg, 40 mg, Oral, q morning, Zada Finders R, MD, 40 mg at 02/04/22 2952 ?  multivitamin with minerals tablet 1 tablet, 1 tablet, Oral, Daily, Cherene Altes, MD, 1 tablet at 02/04/22 8413 ?  ondansetron (ZOFRAN) tablet 4 mg, 4 mg, Oral, Q6H PRN **OR** ondansetron (ZOFRAN) injection 4 mg, 4 mg, Intravenous, Q6H PRN, Posey Pronto, Vishal R, MD ?  potassium chloride SA (KLOR-CON M) CR tablet 40 mEq, 40 mEq, Oral, Daily, Cherene Altes, MD, 40 mEq at 02/04/22 0955 ?  senna-docusate (Senokot-S) tablet 1 tablet, 1 tablet, Oral, QHS PRN, Lenore Cordia, MD ?  ticagrelor (BRILINTA) tablet 90 mg, 90 mg, Oral, BID, Lenore Cordia, MD, 90 mg at 02/04/22 0955 ? ?Patients Current Diet:  ?Diet Order   ? ?       ?  Diet heart healthy/carb modified Room service appropriate? Yes; Fluid consistency: Thin  Diet effective now        ?  ? ?  ?  ? ?  ? ? ?Precautions / Restrictions ?Precautions ?Precautions: Fall ?Restrictions ?Weight Bearing Restrictions: No  ? ?Has the patient had 2 or more falls or a fall with injury in the past year? Yes ? ?Prior Activity Level ?Community (5-7x/wk): Pt. active in the community PTA ? ?Prior Functional Level ?Self Care: Did the patient need help bathing, dressing, using the toilet or eating? Independent ? ?Indoor Mobility: Did the patient need assistance with walking from room to room (with or without device)? Independent ? ?Stairs: Did the patient need assistance with internal or external stairs (with or without device)? Independent ? ?Functional Cognition: Did the patient need help planning regular tasks such as shopping or remembering  to take medications? Independent ? ?Patient Information ?Are you of Hispanic, Latino/a,or Spanish origin?: A. No, not of Hispanic, Latino/a, or Spanish origin ?What is your race?: A. White ?Do you need or want an interpreter to communicate with a doctor or health care staff?: 0. No ? ?Patient's Response To:  ?Health Literacy and Transportation ?Is the patient able to respond to health literacy and transportation needs?: Yes ?Health Literacy - How often do you need to have someone help you when you read instructions, pamphlets, or other written material from your doctor or pharmacy?: Sometimes ?In the past 12 months, has lack of transportation kept you from medical appointments or from getting medications?: No ?In the past 12 months, has lack of transportation kept you from meetings, work, or from getting things needed for daily living?: No ? ?Home Assistive Devices / Equipment ?Home Equipment: Cane - quad ? ?Prior Device Use: Indicate devices/aids used by the patient prior to current illness, exacerbation or injury? None of the above ? ?Current Functional Level ?Cognition ? Arousal/Alertness: Awake/alert ?Overall Cognitive Status: Within Functional Limits for tasks  assessed ?Orientation Level: Oriented X4 ?Attention: Focused, Sustained, Selective ?Focused Attention: Appears intact ?Sustained Attention: Appears intact ?Selective Attention: Appears intact ?Memory: Appea

## 2022-02-04 NOTE — Progress Notes (Signed)
Inpatient Rehab Admissions Coordinator:  ? ?I spoke with pt. Regarding potential CIR admit. Pt. Is interested and states his father can provide 24/7 support. I will open a case with his insurance and pursue for potential admit pending insurance auth and bed availability. ? ?Clemens Catholic, MS, CCC-SLP ?Rehab Admissions Coordinator  ?[phone] (celll) ?[phone] (office) ? ?

## 2022-02-04 NOTE — Progress Notes (Signed)
Carotid artery duplex has been completed. ?Preliminary results can be found in CV Proc through chart review.  ? ?02/04/22 10:42 AM ?[name] RVT   ?

## 2022-02-04 NOTE — Progress Notes (Signed)
?  Echocardiogram ?2D Echocardiogram has been performed. ? ?[name] ?02/04/2022, 12:33 PM ?

## 2022-02-04 NOTE — Plan of Care (Signed)
?  Problem: Education: ?Goal: Knowledge of General Education information will improve ?Description: Including pain rating scale, medication(s)/side effects and non-pharmacologic comfort measures ?Outcome: Progressing ?  ?Problem: Health Behavior/Discharge Planning: ?Goal: Ability to manage health-related needs will improve ?Outcome: Progressing ?  ?Problem: Clinical Measurements: ?Goal: Ability to maintain clinical measurements within normal limits will improve ?Outcome: Progressing ?Goal: Will remain free from infection ?Outcome: Progressing ?Goal: Diagnostic test results will improve ?Outcome: Progressing ?Goal: Respiratory complications will improve ?Outcome: Progressing ?Goal: Cardiovascular complication will be avoided ?Outcome: Progressing ?  ?Problem: Education: ?Goal: Knowledge of disease or condition will improve ?Outcome: Progressing ?Goal: Knowledge of secondary prevention will improve (SELECT ALL) ?Outcome: Progressing ?Goal: Knowledge of patient specific risk factors will improve (INDIVIDUALIZE FOR PATIENT) ?Outcome: Progressing ?  ?Problem: Self-Care: ?Goal: Verbalization of feelings and concerns over difficulty with self-care will improve ?Outcome: Progressing ?Goal: Ability to communicate needs accurately will improve ?Outcome: Progressing ?  ?

## 2022-02-05 DIAGNOSIS — M6281 Muscle weakness (generalized): Secondary | ICD-10-CM | POA: Diagnosis not present

## 2022-02-05 LAB — COMPREHENSIVE METABOLIC PANEL
ALT: 284 U/L — ABNORMAL HIGH (ref 0–44)
AST: 110 U/L — ABNORMAL HIGH (ref 15–41)
Albumin: 3.5 g/dL (ref 3.5–5.0)
Alkaline Phosphatase: 58 U/L (ref 38–126)
Anion gap: 6 (ref 5–15)
BUN: 21 mg/dL (ref 8–23)
CO2: 25 mmol/L (ref 22–32)
Calcium: 9.1 mg/dL (ref 8.9–10.3)
Chloride: 105 mmol/L (ref 98–111)
Creatinine, Ser: 0.52 mg/dL — ABNORMAL LOW (ref 0.61–1.24)
GFR, Estimated: >60 mL/min (ref >60)
Glucose, Bld: 127 mg/dL — ABNORMAL HIGH (ref 70–99)
Potassium: 4.3 mmol/L (ref 3.5–5.1)
Sodium: 136 mmol/L (ref 135–145)
Total Bilirubin: 0.9 mg/dL (ref 0.3–1.2)
Total Protein: 6 g/dL — ABNORMAL LOW (ref 6.5–8.1)

## 2022-02-05 LAB — CBC
HCT: 39.4 % (ref 39.0–52.0)
Hemoglobin: 13.1 g/dL (ref 13.0–17.0)
MCH: 31.6 pg (ref 26.0–34.0)
MCHC: 33.2 g/dL (ref 30.0–36.0)
MCV: 95.2 fL (ref 80.0–100.0)
Platelets: 267 10*3/uL (ref 150–400)
RBC: 4.14 MIL/uL — ABNORMAL LOW (ref 4.22–5.81)
RDW: 13.4 % (ref 11.5–15.5)
WBC: 11.8 10*3/uL — ABNORMAL HIGH (ref 4.0–10.5)
nRBC: 0 % (ref 0.0–0.2)

## 2022-02-05 LAB — GLUCOSE, CAPILLARY
Glucose-Capillary: 128 mg/dL — ABNORMAL HIGH (ref 70–99)
Glucose-Capillary: 145 mg/dL — ABNORMAL HIGH (ref 70–99)
Glucose-Capillary: 213 mg/dL — ABNORMAL HIGH (ref 70–99)
Glucose-Capillary: 344 mg/dL — ABNORMAL HIGH (ref 70–99)

## 2022-02-05 LAB — CK: Total CK: 4051 U/L — ABNORMAL HIGH (ref 49–397)

## 2022-02-05 MED ORDER — PANTOPRAZOLE SODIUM 40 MG PO TBEC
40.0000 mg | DELAYED_RELEASE_TABLET | Freq: Two times a day (BID) | ORAL | Status: AC
  Administered 2022-02-05 – 2022-02-08 (×7): 40 mg via ORAL
  Filled 2022-02-05 (×7): qty 1

## 2022-02-05 MED ORDER — SODIUM CHLORIDE 0.9 % IV SOLN
1000.0000 mg | Freq: Every day | INTRAVENOUS | Status: AC
  Administered 2022-02-05 – 2022-02-07 (×3): 1000 mg via INTRAVENOUS
  Filled 2022-02-05 (×3): qty 16

## 2022-02-05 MED ORDER — SODIUM CHLORIDE 0.9 % IV BOLUS
1000.0000 mL | Freq: Once | INTRAVENOUS | Status: AC
  Administered 2022-02-05: 1000 mL via INTRAVENOUS

## 2022-02-05 MED ORDER — PANTOPRAZOLE SODIUM 40 MG PO TBEC: 40.0000 mg | DELAYED_RELEASE_TABLET | Freq: Every day | ORAL | Status: DC

## 2022-02-05 NOTE — Progress Notes (Signed)
? [name]  HKV:[id number] DOB: 1955-11-06 DOA: 02/02/2022 ?PCP: Greig Right, MD   ? ?Brief Narrative:  ?66yo with a history of CAD status post DES to ostial RCA 12/28/2021, DM2, and HTN who presented to the ED with progressive weakness in both lower extremities and upper extremities.  He had recently been discharged from the hospital 12/29/2021 after suffering an NSTEMI at which time a DES was placed to the ostial RCA.  He was not started on a statin at that time due to elevated LFTs.  In subsequent follow-up however, atorvastatin 20 mg daily was initiated, and the patient has been on this for approximately 5 weeks.  During this time, he has developed progressive weakness of the proximal muscles of both legs and arms to the point that he requires assistance from family to mobilize, and also reports an unintentional 30 pound weight loss.  In the ER CK was found to be 7291.  LFTs were also elevated. ? ?Consultants:  ?Neurology ? ?Code Status: Full Code  ? ?DVT prophylaxis: ?Lovenox ? ?Interim Hx: ?No acute events reported overnight.  Afebrile.  Vital signs stable.  CBG presently reasonably controlled.  Resting comfortably in bed at the time of my visit. ? ?Assessment & Plan: ? 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 Leonel Ramsay ?-High-dose steroid has begun ?- Dr. Leonel Ramsay with Neuro to guide workup  ? ?Acute right pontine CVA ?-MRI brain noted acute infarct R pons ?-CTa head/neck notes extensive atherosclerosis w/ at least 70% R ICA stenosis and 50% proximal L ICA stenosis as well as B vertebral origin stenosis of 50% on R and 40% on L -carotid Dopplers however suggest much less severe disease ?-Continue ASA + Brilinta ?-A1c 7.4  ?-LDL 191 (12/28/21) and 98 now (due to statin) ?-Neuro feels this is an incidental finding and not  related to this presentation -no plan for further investigation during this hospital stay ? ?Cervical spine neuroforaminal stenosis ?Noted on MRI cervical spine - MRI of thoracic and lumbar spine w/o acute findings that fit the clinical situation, but confirmed multiple areas of spinal disease with facet impingement -will need outpatient clinical monitoring ? ?CAD s/p DES to ostial RCA 12/28/2021 ?-Continue aspirin and Brilinta ?-Continue Coreg ? ?Elevated LFTs ?CT A/P without hepatobiliary abnormality -history suggest this was likely the reason his initial trial of statin was discontinued -LFTs steadily improving in absence of statin -will need to investigate other options for treatment in the outpatient setting, with possible referral to lipid specialist ? ?Mild hypokalemia ?Likely simply due to poor intake -corrected with supplementation -magnesium is normal ? ?Type 2 diabetes mellitus  ?A1c recently 7.4 -CBG well controlled at present -anticipate hyperglycemia with high-dose steroids ? ?Hyperlipidemia ?Holding statin due to above -see discussion above ? ?6 mm right upper lobe pulmonary nodule ?Noted on scan during this admission and also present on prior exams with radiologist stating stability over time allows [place] to safely consider this to be a benign finding ? ?Family Communication:  ?Disposition: From home - possible CIR admission depending upon hospital course ? ?Objective: ?Blood pressure 106/82, pulse 82, temperature 98.5 ?F (36.9 ?C), temperature source Oral, resp. rate 18, SpO2 94 %. ? ?Intake/Output Summary (Last 24 hours) at 02/05/2022 0853 ?Last data filed at 02/05/2022 0800 ?Gross per 24 hour  ?Intake 240 ml  ?Output 1150 ml  ?  Net -910 ml  ? ? ?There were no vitals filed for this visit. ? ?Examination: ?No acute respiratory distress with normal respiratory effort.  No wheezing.  Resting comfortably in bed. ? ?CBC: ?Recent Labs  ?Lab 02/02/22 ?1702 02/03/22 ?0221 02/05/22 ?0143  ?WBC 10.4 10.8* 11.8*   ?NEUTROABS 6.1  --   --   ?HGB 14.2 12.7* 13.1  ?HCT 42.5 38.9* 39.4  ?MCV 96.2 96.8 95.2  ?PLT 321 273 267  ? ? ?Basic Metabolic Panel: ?Recent Labs  ?Lab 02/03/22 ?0221 02/04/22 ?0242 02/05/22 ?0143  ?NA 139 139 136  ?K 3.4* 3.8 4.3  ?CL 108 105 105  ?CO2 '26 30 25  '$ ?GLUCOSE 117* 111* 127*  ?BUN '13 15 21  '$ ?CREATININE 0.37* 0.46* 0.52*  ?CALCIUM 9.0 9.4 9.1  ?MG  --  1.9  --   ? ? ?GFR: ?CrCl cannot be calculated (Unknown ideal weight.). ? ?Liver Function Tests: ?Recent Labs  ?Lab 02/02/22 ?1702 02/03/22 ?0221 02/05/22 ?0143  ?AST 181* 140* 110*  ?ALT 385* 317* 284*  ?ALKPHOS 66 52 58  ?BILITOT 0.7 0.9 0.9  ?PROT 7.1 6.1* 6.0*  ?ALBUMIN 4.1 3.5 3.5  ? ? ? ?Cardiac Enzymes: ?Recent Labs  ?Lab 02/02/22 ?1702 02/03/22 ?0221 02/04/22 ?0242 02/05/22 ?0143  ?CKTOTAL 7,291* 1,940* 5,461* 4,051*  ? ? ?Scheduled Meds: ?  stroke: early stages of recovery book   Does not apply Once  ? amLODipine  10 mg Oral q morning  ? aspirin EC  81 mg Oral Daily  ? carvedilol  12.5 mg Oral BID WC  ? cholecalciferol  1,000 Units Oral Daily  ? citalopram  20 mg Oral Daily  ? cyclobenzaprine  10 mg Oral QHS  ? enoxaparin (LOVENOX) injection  40 mg Subcutaneous Q24H  ? insulin aspart  0-5 Units Subcutaneous QHS  ? insulin aspart  0-9 Units Subcutaneous TID WC  ? insulin glargine-yfgn  10 Units Subcutaneous QHS  ? lisinopril  40 mg Oral q morning  ? multivitamin with minerals  1 tablet Oral Daily  ? potassium chloride  40 mEq Oral Daily  ? ticagrelor  90 mg Oral BID  ? ?Continuous Infusions: ? methylPREDNISolone (SOLU-MEDROL) injection    ? ? ? LOS: 1 day  ? ?Cherene Altes, MD ?[facility] ?Office  [phone] ?Pager - Text Page per Shea Evans ? ?If 7PM-7AM, please contact night-coverage per Amion ?02/05/2022, 8:53 AM ? ? ? ? ?

## 2022-02-05 NOTE — Progress Notes (Signed)
NEUROLOGY PROGRESS NOTE ? ?Subjective: ?Continues to complain of proximal muscle weakness. ?Stood up with walker but feels weak right away. ? ?Per Dr. Leonel Ramsay yesterday ?He initially went on statins approximately 2 years ago, and then about a year ago came off of them because of elevated LFTs.  He did not have any weakness at that time. ?His weakness began 2 to 3 months ago, prior to reinitiation of his statin.  He has noticed a precipitous decline over the past month since starting it, however. ?He has noticed himself being unsteady, but attributed this to the weakness. ? ?Exam: ?Vitals:  ? 02/05/22 0332 02/05/22 0757  ?BP: (!) 156/69 106/82  ?Pulse: 71 82  ?Resp: 16 18  ?Temp: 97.6 ?F (36.4 ?C) 98.5 ?F (36.9 ?C)  ?SpO2: 92% 94%  ? ?Gen: In bed, NAD ?Resp: non-labored breathing, no acute distress ?Abd: soft, nt ? ?Neuro: ?MS: Awake, alert, interactive and appropriate ?CN: EOMI, visual fields full, face symmetric ?Motor: He has 3/5 proximal weakness and 4+/5 distal in both the arms, ?Sensory: Intact to light touch and temperature. ?DTR: He has diminished reflexes in the upper extremities at the biceps and brachioradialis, 2+ and symmetric at the knees and ankles. ? ?Unchanged from yesterday ? ? ?Pertinent Labs: ?LDL 97 ?ESR 20 ?CRP 1.4 ?CK: 7291-->1940-->5461-->4051 ?A1c: 7.4 ? ?2d echo: LVEF 60-65%, No MR, No IAS, No AR ? ? ?Impression:  ?66 year old male with bilaterally symmetric, progressive proximal >> distal muscle weakness.  He has no sensory symptoms to suggest a neuropathic etiology, and the preservation of reflexes distally would argue against this as well.  The fact that his symptoms were there prior to reinitiation of statin therapy makes me think that anti-HMGCOA antibodies are less likely, though the worsening after reintroduction is concerning. ? ?He does not appear to be symptomatic from the small brainstem stroke seen on MRI.  I suspect that this is incidental, and appears to be a small vessel  stroke.   ? 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 Work up completed. ? ?Recommendations: ?Solumedrol 1g daily x 3 days, followed by oral steroids-first dose of solu-medrol being started today ?NS 1L bolus ?Muscle biopsy (Dr Leonel Ramsay discussed with Saverio Danker of general surgery, this cannot be done over the weekend.  She will add to the list to be seen early next week) ? HMG CoA reductase antibodies ? PT/OT/ST ? continue aspirin and Brilinta for secondary stroke prevention ? He will likely need alternative cholesterol management options in light of possible statin induced myopathy being his presenting issue. ? DM management with goal A1c less than seven ?Check CK in the AM ? ?Plan relayed to Dr. Thereasa Solo ? ?-- ?Amie Portland, MD ?Neurologist ?[facility] ?Pager: [phone] ?

## 2022-02-06 DIAGNOSIS — M3320 Polymyositis, organ involvement unspecified: Secondary | ICD-10-CM | POA: Diagnosis not present

## 2022-02-06 DIAGNOSIS — R748 Abnormal levels of other serum enzymes: Secondary | ICD-10-CM | POA: Diagnosis not present

## 2022-02-06 DIAGNOSIS — M6281 Muscle weakness (generalized): Secondary | ICD-10-CM | POA: Diagnosis not present

## 2022-02-06 LAB — COMPREHENSIVE METABOLIC PANEL
ALT: 288 U/L — ABNORMAL HIGH (ref 0–44)
AST: 99 U/L — ABNORMAL HIGH (ref 15–41)
Albumin: 3.6 g/dL (ref 3.5–5.0)
Alkaline Phosphatase: 59 U/L (ref 38–126)
Anion gap: 10 (ref 5–15)
BUN: 26 mg/dL — ABNORMAL HIGH (ref 8–23)
CO2: 20 mmol/L — ABNORMAL LOW (ref 22–32)
Calcium: 9.2 mg/dL (ref 8.9–10.3)
Chloride: 106 mmol/L (ref 98–111)
Creatinine, Ser: 0.68 mg/dL (ref 0.61–1.24)
GFR, Estimated: >60 mL/min (ref >60)
Glucose, Bld: 289 mg/dL — ABNORMAL HIGH (ref 70–99)
Potassium: 4.6 mmol/L (ref 3.5–5.1)
Sodium: 136 mmol/L (ref 135–145)
Total Bilirubin: 0.9 mg/dL (ref 0.3–1.2)
Total Protein: 6.5 g/dL (ref 6.5–8.1)

## 2022-02-06 LAB — GLUCOSE, CAPILLARY
Glucose-Capillary: 265 mg/dL — ABNORMAL HIGH (ref 70–99)
Glucose-Capillary: 272 mg/dL — ABNORMAL HIGH (ref 70–99)
Glucose-Capillary: 287 mg/dL — ABNORMAL HIGH (ref 70–99)
Glucose-Capillary: 200 mg/dL — ABNORMAL HIGH (ref 70–99)

## 2022-02-06 LAB — CK: Total CK: 3501 U/L — ABNORMAL HIGH (ref 49–397)

## 2022-02-06 MED ORDER — INSULIN GLARGINE-YFGN 100 UNIT/ML ~~LOC~~ SOLN
20.0000 [IU] | Freq: Two times a day (BID) | SUBCUTANEOUS | Status: DC
Start: 2022-02-06 — End: 2022-02-08
  Administered 2022-02-06 – 2022-02-08 (×4): 20 [IU] via SUBCUTANEOUS
  Filled 2022-02-06 (×6): qty 0.2

## 2022-02-06 MED ORDER — INSULIN ASPART 100 UNIT/ML IJ SOLN: 0.0000 [IU] | Freq: Every day | INTRAMUSCULAR | Status: DC

## 2022-02-06 MED ORDER — INSULIN ASPART 100 UNIT/ML IJ SOLN
0.0000 [IU] | Freq: Three times a day (TID) | INTRAMUSCULAR | Status: DC
  Administered 2022-02-06 (×2): 11 [IU] via SUBCUTANEOUS
  Administered 2022-02-07: 15 [IU] via SUBCUTANEOUS
  Administered 2022-02-07: 4 [IU] via SUBCUTANEOUS
  Administered 2022-02-07: 7 [IU] via SUBCUTANEOUS
  Administered 2022-02-08 (×2): 4 [IU] via SUBCUTANEOUS

## 2022-02-06 NOTE — Progress Notes (Signed)
NEUROLOGY PROGRESS NOTE ? ?Subjective: ?Day 2 of 3 solumedrol ?Feels proximal muscle weakness has improved ?Was able to ambulate to bathroom today ? ? ?Exam: ?Vitals:  ? 02/06/22 1218 02/06/22 1702  ?BP: 133/83 (!) 152/93  ?Pulse: 80 85  ?Resp: 17 19  ?Temp: 97.9 ?F (36.6 ?C) 97.6 ?F (36.4 ?C)  ?SpO2: 90% 95%  ? ?Gen: In bed, NAD ?Resp: non-labored breathing, no acute distress ?Abd: soft, nt ? ?Neuro: ?MS: Awake, alert, interactive and appropriate ?CN: EOMI, visual fields full, face symmetric ?Motor: He has 4-/5 proximal weakness and 4+/5 distal in both the arms, 4/5 proximal weakness and 4+/5 distal in both legs ?Sensory: SILT ?DTR: He has diminished reflexes in the upper extremities at the biceps and brachioradialis, 2+ and symmetric at the knees and ankles. ? ?Slightly improved from yesterday ? ? ?Pertinent Labs: ?LDL 97 ?ESR 20 ?CRP 1.4 ?CK: 7291-->1940-->5461-->4051-> 3501 ?A1c: 7.4 ? ?2d echo: LVEF 60-65%, No MR, No IAS, No AR ? ? ?Impression:  ?66 year old male with bilaterally symmetric, progressive proximal >> distal muscle weakness.  He has no sensory symptoms to suggest a neuropathic etiology, and the preservation of reflexes distally would argue against this as well.  The fact that his symptoms were there prior to reinitiation of statin therapy makes me think that anti-HMGCOA antibodies are less likely, though the worsening after reintroduction is concerning. ? ?He has improved significantly since initiating steroids which favors inflammatory myositis. He will have a muscle biopsy early this coming week with gen surg. ? ?He does not appear to be symptomatic from the small brainstem stroke seen on MRI.  I suspect that this is incidental, and appears to be a small vessel stroke.   ? 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 Work up completed. ? ?Recommendations: ?Solumedrol 1g daily x 3 days end date  5/15, followed by oral steroids ?Muscle biopsy (Dr Leonel Ramsay discussed with Saverio Danker of general surgery, this cannot be done over the weekend.  She will add to the list to be seen early next week) ? HMG CoA reductase antibodies pending ? PT/OT/ST ? continue aspirin and Brilinta for secondary stroke prevention ? He will likely need alternative cholesterol management options in light of possible statin induced myopathy being his presenting issue. ?DM management with goal A1c less than seven ?IV hydration + trend CK ? ?Su Monks, MD ?[facility] ?[phone] ? ?If 7pm- 7am, please page neurology on call as listed in [place]. ? ?

## 2022-02-06 NOTE — Progress Notes (Signed)
? [name]  PJK:[id number] DOB: 07-16-1956 DOA: 02/02/2022 ?PCP: Greig Right, MD   ? ?Brief Narrative:  ?66yo with a history of CAD status post DES to ostial RCA 12/28/2021, DM2, and HTN who presented to the ED with progressive weakness in both lower extremities and upper extremities.  He had recently been discharged from the hospital 12/29/2021 after suffering an NSTEMI at which time a DES was placed to the ostial RCA.  He was not started on a statin at that time due to elevated LFTs.  In subsequent follow-up however, atorvastatin 20 mg daily was initiated, and the patient has been on this for approximately 5 weeks.  During this time, he has developed progressive weakness of the proximal muscles of both legs and arms to the point that he requires assistance from family to mobilize, and also reports an unintentional 30 pound weight loss.  In the ER CK was found to be 7291.  LFTs were also elevated. ? ?Consultants:  ?Neurology ? ?Code Status: Full Code  ? ?DVT prophylaxis: ?Lovenox ? ?Interim Hx: ?Afebrile.  Vital signs stable.  CBG trending upward as expected with high-dose steroid.  CK appears to be trending down presently.  Patient is beginning to appreciate significant clinical improvement already.  He denies fever chills shortness of breath nausea or vomiting.  Reports good intake. ? ?Assessment & Plan: ? 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 Leonel Ramsay ?- high-dose steroid being administered ?- Dr. Leonel Ramsay with Neuro to guide workup  ? ?Acute right pontine CVA ?-MRI brain noted acute infarct R pons ?-CTa head/neck notes extensive atherosclerosis w/ at least 70% R ICA stenosis and 50% proximal L ICA stenosis as well as B vertebral origin stenosis of 50% on R and 40% on L -carotid Dopplers however suggest much less  severe disease ?-Continue ASA + Brilinta ?-A1c 7.4  ?-LDL 191 (12/28/21) and 98 now (due to statin) ?-Neuro feels this is an incidental finding and not related to this presentation -no plan for further investigation during this hospital stay ? ?Cervical spine neuroforaminal stenosis ?Noted on MRI cervical spine - MRI of thoracic and lumbar spine w/o acute findings that fit the clinical situation, but confirmed multiple areas of spinal disease with facet impingement -will need outpatient clinical monitoring ? ?CAD s/p DES to ostial RCA 12/28/2021 ?-Continue aspirin and Brilinta ?-Continue Coreg ? ?Elevated LFTs ?CT A/P without hepatobiliary abnormality -history suggest this was likely the reason his initial trial of statin was discontinued -LFTs steadily improving in absence of statin -will need to investigate other options for treatment in the outpatient setting, with possible referral to lipid specialist ? ?Mild hypokalemia ?Likely simply due to poor intake -corrected with supplementation -magnesium is normal ? ?Type 2 diabetes mellitus  ?A1c recently 7.4 -CBG well controlled at present -anticipate hyperglycemia with high-dose steroids -insulin dosing adjusted today ? ?Hyperlipidemia ?Holding statin due to above -see discussion above ? ?6 mm right upper lobe pulmonary nodule ?Noted on scan during this admission and also present on prior exams with radiologist stating stability over time allows [place] to safely consider this to be a benign finding ? ?Family Communication: No family present at time of exam today ?Disposition: From home - possible CIR admission depending upon hospital course ? ?Objective: ?Blood pressure 128/68, pulse 85, temperature 97.9 ?F (36.6 ?C), temperature source Oral, resp.  rate 20, SpO2 93 %. ? ?Intake/Output Summary (Last 24 hours) at 02/06/2022 1032 ?Last data filed at 02/05/2022 2100 ?Gross per 24 hour  ?Intake 66 ml  ?Output 450 ml  ?Net -384 ml  ? ? ?There were no vitals filed for this  visit. ? ?Examination: ?General: No acute respiratory distress ?Lungs: Clear to auscultation bilaterally without wheezes or crackles ?Cardiovascular: Regular rate and rhythm without murmur gallop or rub normal S1 and S2 ?Abdomen: Nontender, nondistended, soft, bowel sounds positive, no rebound, no ascites, no appreciable mass ?Extremities: No significant cyanosis, clubbing, or edema bilateral lower extremities ? ?CBC: ?Recent Labs  ?Lab 02/02/22 ?1702 02/03/22 ?0221 02/05/22 ?0143  ?WBC 10.4 10.8* 11.8*  ?NEUTROABS 6.1  --   --   ?HGB 14.2 12.7* 13.1  ?HCT 42.5 38.9* 39.4  ?MCV 96.2 96.8 95.2  ?PLT 321 273 267  ? ? ?Basic Metabolic Panel: ?Recent Labs  ?Lab 02/04/22 ?0242 02/05/22 ?0143 02/06/22 ?7026  ?NA 139 136 136  ?K 3.8 4.3 4.6  ?CL 105 105 106  ?CO2 30 25 20*  ?GLUCOSE 111* 127* 289*  ?BUN 15 21 26*  ?CREATININE 0.46* 0.52* 0.68  ?CALCIUM 9.4 9.1 9.2  ?MG 1.9  --   --   ? ? ?GFR: ?CrCl cannot be calculated (Unknown ideal weight.). ? ?Liver Function Tests: ?Recent Labs  ?Lab 02/02/22 ?1702 02/03/22 ?0221 02/05/22 ?0143 02/06/22 ?3785  ?AST 181* 140* 110* 99*  ?ALT 385* 317* 284* 288*  ?ALKPHOS 66 52 58 59  ?BILITOT 0.7 0.9 0.9 0.9  ?PROT 7.1 6.1* 6.0* 6.5  ?ALBUMIN 4.1 3.5 3.5 3.6  ? ? ? ?Cardiac Enzymes: ?Recent Labs  ?Lab 02/02/22 ?1702 02/03/22 ?0221 02/04/22 ?0242 02/05/22 ?0143 02/06/22 ?8850  ?CKTOTAL 7,291* 1,940* 5,461* 4,051* 3,501*  ? ? ?Scheduled Meds: ? amLODipine  10 mg Oral q morning  ? aspirin EC  81 mg Oral Daily  ? carvedilol  12.5 mg Oral BID WC  ? cholecalciferol  1,000 Units Oral Daily  ? citalopram  20 mg Oral Daily  ? cyclobenzaprine  10 mg Oral QHS  ? enoxaparin (LOVENOX) injection  40 mg Subcutaneous Q24H  ? insulin aspart  0-5 Units Subcutaneous QHS  ? insulin aspart  0-9 Units Subcutaneous TID WC  ? insulin glargine-yfgn  10 Units Subcutaneous QHS  ? lisinopril  40 mg Oral q morning  ? multivitamin with minerals  1 tablet Oral Daily  ? pantoprazole  40 mg Oral BID  ? potassium  chloride  40 mEq Oral Daily  ? ticagrelor  90 mg Oral BID  ? ?Continuous Infusions: ? methylPREDNISolone (SOLU-MEDROL) injection 1,000 mg (02/06/22 0919)  ? ? ? LOS: 2 days  ? ?Cherene Altes, MD ?[facility] ?Office  [phone] ?Pager - Text Page per Shea Evans ? ?If 7PM-7AM, please contact night-coverage per Amion ?02/06/2022, 10:32 AM ? ? ? ? ?

## 2022-02-07 DIAGNOSIS — M609 Myositis, unspecified: Secondary | ICD-10-CM

## 2022-02-07 DIAGNOSIS — M6281 Muscle weakness (generalized): Secondary | ICD-10-CM | POA: Diagnosis not present

## 2022-02-07 LAB — COMPREHENSIVE METABOLIC PANEL
ALT: 262 U/L — ABNORMAL HIGH (ref 0–44)
AST: 65 U/L — ABNORMAL HIGH (ref 15–41)
Albumin: 3.6 g/dL (ref 3.5–5.0)
Alkaline Phosphatase: 52 U/L (ref 38–126)
Anion gap: 7 (ref 5–15)
BUN: 29 mg/dL — ABNORMAL HIGH (ref 8–23)
CO2: 25 mmol/L (ref 22–32)
Calcium: 9.2 mg/dL (ref 8.9–10.3)
Chloride: 107 mmol/L (ref 98–111)
Creatinine, Ser: 0.51 mg/dL — ABNORMAL LOW (ref 0.61–1.24)
GFR, Estimated: >60 mL/min (ref >60)
Glucose, Bld: 221 mg/dL — ABNORMAL HIGH (ref 70–99)
Potassium: 4.2 mmol/L (ref 3.5–5.1)
Sodium: 139 mmol/L (ref 135–145)
Total Bilirubin: 0.9 mg/dL (ref 0.3–1.2)
Total Protein: 6.4 g/dL — ABNORMAL LOW (ref 6.5–8.1)

## 2022-02-07 LAB — GLUCOSE, CAPILLARY
Glucose-Capillary: 183 mg/dL — ABNORMAL HIGH (ref 70–99)
Glucose-Capillary: 207 mg/dL — ABNORMAL HIGH (ref 70–99)
Glucose-Capillary: 302 mg/dL — ABNORMAL HIGH (ref 70–99)
Glucose-Capillary: 315 mg/dL — ABNORMAL HIGH (ref 70–99)

## 2022-02-07 LAB — MAGNESIUM: Magnesium: 2.1 mg/dL (ref 1.7–2.4)

## 2022-02-07 LAB — CK: Total CK: 1841 U/L — ABNORMAL HIGH (ref 49–397)

## 2022-02-07 LAB — PHOSPHORUS: Phosphorus: 3 mg/dL (ref 2.5–4.6)

## 2022-02-07 MED ORDER — PREDNISONE 20 MG PO TABS
80.0000 mg | ORAL_TABLET | Freq: Every day | ORAL | Status: DC
  Administered 2022-02-08: 80 mg via ORAL
  Filled 2022-02-07: qty 4

## 2022-02-07 NOTE — Progress Notes (Signed)
Discussed the need for muscle biopsy while unable to stop Brilinta given MI with DES 1 month ago.  Unfortunately, this is very high risk for bleeding complication and after discussion with Dr. Donne Hazel would not be recommended at this time.  Relayed to neurology. ? ?[name] ?8:46 AM ?02/07/2022 ? ?

## 2022-02-07 NOTE — Care Management Important Message (Signed)
Important Message ? ?Patient Details  ?Name: [name] ?MRN: [id number] ?Date of Birth: [birth year] ? ? ?Medicare Important Message Given:  No ? ?Patient declined to sign copy form Case Management but he did have the copy with his signature that he received from admission. ? ? ?Milford Cilento ?02/07/2022, 4:08 PM ?

## 2022-02-07 NOTE — Progress Notes (Signed)
PT Cancellation Note ? ?Patient Details ?Name: [name] ?MRN: [id number] ?DOB: 05-02-1956 ? ? ?Cancelled Treatment:    Reason Eval/Treat Not Completed: Patient at procedure or test/unavailable ? ?Patient working with OT.  ? ? ?Arby Barrette, PT ?Acute Rehabilitation Services  ?Pager [phone] ?Office [phone] ? ?[name] ?02/07/2022, 10:01 AM ?

## 2022-02-07 NOTE — Progress Notes (Addendum)
Subjective: ?Day 3/3 Solumedrol. Subjectively with stable strength since yesterday.  ? ?Objective: ?Current vital signs: ?BP 128/86 (BP Location: Right Arm)   Pulse 79   Temp 97.9 ?F (36.6 ?C) (Oral)   Resp 16   SpO2 93%  ?Vital signs in last 24 hours: ?Temp:  [97.6 ?F (36.4 ?C)-98.1 ?F (36.7 ?C)] 97.9 ?F (36.6 ?C) (05/15 0813) ?Pulse Rate:  [70-85] 79 (05/15 0813) ?Resp:  [16-20] 16 (05/15 0813) ?BP: (128-152)/(66-93) 128/86 (05/15 0813) ?SpO2:  [90 %-100 %] 93 % (05/15 0813) ? ?Intake/Output from previous day: ?No intake/output data recorded. ?Intake/Output this shift: ?No intake/output data recorded. ?Nutritional status:  ?Diet Order   ? ?       ?  Diet Carb Modified Fluid consistency: Thin; Room service appropriate? Yes  Diet effective now       ?  ? ?  ?  ? ?  ? ?HEENT: [place]/AT ?Lungs: Respirations unlabored ?Ext: Warm and well perfused ? ?Neurologic Exam: ?Ment: Awake and alert. Speech fluent with intact comprehension.  ?CN: EOMI. Face symmetric with normal expressivity. Phonation intact without dysphonia or hoarseness.  ?Motor:  ?LUE 3/5 deltoid, able to elevate to slightly below the horizontal plane, but no further. 4/5 triceps and biceps. 5/5 grip, 4+/5 hand intrinsics.  ?RUE: 2-3/5 deltoid, able to elevate to slightly below the horizontal plane, but no further. 4-/5 triceps and biceps. 5/5 grip, 4+/5 hand intrinsics. ?LLE 2-3/5 hip flexion, 4/5 knee extension ?RLE: 2-3/5 hip flexion, 4/5 knee extension ?Sensory: Intact to FT x 4 ?Reflexes: Normoactive.  ?Cerebellar: No ataxia noted.  ? ?Lab Results: ?Results for orders placed or performed during the hospital encounter of 02/02/22 (from the past 48 hour(s))  ?Glucose, capillary     Status: Abnormal  ? Collection Time: 02/05/22 11:58 AM  ?Result Value Ref Range  ? Glucose-Capillary 145 (H) 70 - 99 mg/dL  ?  Comment: Glucose reference range applies only to samples taken after fasting for at least 8 hours.  ?Glucose, capillary     Status: Abnormal  ?  Collection Time: 02/05/22  4:11 PM  ?Result Value Ref Range  ? Glucose-Capillary 213 (H) 70 - 99 mg/dL  ?  Comment: Glucose reference range applies only to samples taken after fasting for at least 8 hours.  ?Glucose, capillary     Status: Abnormal  ? Collection Time: 02/05/22  9:06 PM  ?Result Value Ref Range  ? Glucose-Capillary 344 (H) 70 - 99 mg/dL  ?  Comment: Glucose reference range applies only to samples taken after fasting for at least 8 hours.  ?Comprehensive metabolic panel     Status: Abnormal  ? Collection Time: 02/06/22 12:43 AM  ?Result Value Ref Range  ? Sodium 136 135 - 145 mmol/L  ? Potassium 4.6 3.5 - 5.1 mmol/L  ? Chloride 106 98 - 111 mmol/L  ? CO2 20 (L) 22 - 32 mmol/L  ? Glucose, Bld 289 (H) 70 - 99 mg/dL  ?  Comment: Glucose reference range applies only to samples taken after fasting for at least 8 hours.  ? BUN 26 (H) 8 - 23 mg/dL  ? Creatinine, Ser 0.68 0.61 - 1.24 mg/dL  ? Calcium 9.2 8.9 - 10.3 mg/dL  ? Total Protein 6.5 6.5 - 8.1 g/dL  ? Albumin 3.6 3.5 - 5.0 g/dL  ? AST 99 (H) 15 - 41 U/L  ? ALT 288 (H) 0 - 44 U/L  ? Alkaline Phosphatase 59 38 - 126 U/L  ? Total Bilirubin 0.9  0.3 - 1.2 mg/dL  ? GFR, Estimated >60 >60 mL/min  ?  Comment: (NOTE) ?Calculated using the CKD-EPI Creatinine Equation (2021) ?  ? Anion gap 10 5 - 15  ?  Comment: Performed at [facility], [place] [address], [place], [place] [address]  ?CK     Status: Abnormal  ? Collection Time: 02/06/22 12:43 AM  ?Result Value Ref Range  ? Total CK 3,501 (H) 49 - 397 U/L  ?  Comment: Performed at [facility], [place] [address], [place], [place] [address]  ?Glucose, capillary     Status: Abnormal  ? Collection Time: 02/06/22  7:49 AM  ?Result Value Ref Range  ? Glucose-Capillary 265 (H) 70 - 99 mg/dL  ?  Comment: Glucose reference range applies only to samples taken after fasting for at least 8 hours.  ?Glucose, capillary     Status: Abnormal  ? Collection Time: 02/06/22 12:09 PM  ?Result Value Ref Range  ?  Glucose-Capillary 272 (H) 70 - 99 mg/dL  ?  Comment: Glucose reference range applies only to samples taken after fasting for at least 8 hours.  ?Glucose, capillary     Status: Abnormal  ? Collection Time: 02/06/22  5:04 PM  ?Result Value Ref Range  ? Glucose-Capillary 287 (H) 70 - 99 mg/dL  ?  Comment: Glucose reference range applies only to samples taken after fasting for at least 8 hours.  ?Glucose, capillary     Status: Abnormal  ? Collection Time: 02/06/22  9:24 PM  ?Result Value Ref Range  ? Glucose-Capillary 200 (H) 70 - 99 mg/dL  ?  Comment: Glucose reference range applies only to samples taken after fasting for at least 8 hours.  ?Comprehensive metabolic panel     Status: Abnormal  ? Collection Time: 02/07/22  1:52 AM  ?Result Value Ref Range  ? Sodium 139 135 - 145 mmol/L  ? Potassium 4.2 3.5 - 5.1 mmol/L  ? Chloride 107 98 - 111 mmol/L  ? CO2 25 22 - 32 mmol/L  ? Glucose, Bld 221 (H) 70 - 99 mg/dL  ?  Comment: Glucose reference range applies only to samples taken after fasting for at least 8 hours.  ? BUN 29 (H) 8 - 23 mg/dL  ? Creatinine, Ser 0.51 (L) 0.61 - 1.24 mg/dL  ? Calcium 9.2 8.9 - 10.3 mg/dL  ? Total Protein 6.4 (L) 6.5 - 8.1 g/dL  ? Albumin 3.6 3.5 - 5.0 g/dL  ? AST 65 (H) 15 - 41 U/L  ? ALT 262 (H) 0 - 44 U/L  ? Alkaline Phosphatase 52 38 - 126 U/L  ? Total Bilirubin 0.9 0.3 - 1.2 mg/dL  ? GFR, Estimated >60 >60 mL/min  ?  Comment: (NOTE) ?Calculated using the CKD-EPI Creatinine Equation (2021) ?  ? Anion gap 7 5 - 15  ?  Comment: Performed at [facility], [place] [address] [place], [place] [address]  ?CK     Status: Abnormal  ? Collection Time: 02/07/22  1:52 AM  ?Result Value Ref Range  ? Total CK 1,841 (H) 49 - 397 U/L  ?  Comment: Performed at [facility], [place] [address] [place], [place] [address]  ?Magnesium     Status: None  ? Collection Time: 02/07/22  1:52 AM  ?Result Value Ref Range  ? Magnesium 2.1 1.7 - 2.4 mg/dL  ?  Comment: Performed at [facility], [place]  [address] [place], [place]  [address]  ?Phosphorus     Status: None  ? Collection Time: 02/07/22  1:52 AM  ?Result Value Ref Range  ? Phosphorus 3.0 2.5 - 4.6 mg/dL  ?  Comment: Performed at [facility], [place] [address], [place], [place] [address]  ?Glucose, capillary     Status: Abnormal  ? Collection Time: 02/07/22  8:10 AM  ?Result Value Ref Range  ? Glucose-Capillary 183 (H) 70 - 99 mg/dL  ?  Comment: Glucose reference range applies only to samples taken after fasting for at least 8 hours.  ? ? ?No results found for this or any previous visit (from the past 240 hour(s)). ? ?Lipid Panel ?No results for input(s): CHOL, TRIG, HDL, CHOLHDL, VLDL, LDLCALC in the last 72 hours. ? ?Studies/Results: ?No results found. ? ?Medications: Scheduled: ? amLODipine  10 mg Oral q morning  ? aspirin EC  81 mg Oral Daily  ? carvedilol  12.5 mg Oral BID WC  ? cholecalciferol  1,000 Units Oral Daily  ? citalopram  20 mg Oral Daily  ? cyclobenzaprine  10 mg Oral QHS  ? enoxaparin (LOVENOX) injection  40 mg Subcutaneous Q24H  ? insulin aspart  0-20 Units Subcutaneous TID WC  ? insulin aspart  0-5 Units Subcutaneous QHS  ? insulin glargine-yfgn  20 Units Subcutaneous BID  ? lisinopril  40 mg Oral q morning  ? multivitamin with minerals  1 tablet Oral Daily  ? pantoprazole  40 mg Oral BID  ? potassium chloride  40 mEq Oral Daily  ? ticagrelor  90 mg Oral BID  ? ? ? ?Impression: 66 year old male with bilaterally symmetric, progressive proximal >> distal muscle weakness.  He has no sensory symptoms to suggest a neuropathic etiology, and the preservation of reflexes distally would argue against this as well.  The fact that his symptoms were there prior to reinitiation of statin therapy makes me think that anti-HMGCOA antibodies are less likely, though the worsening after reintroduction is concerning. ?- He has improved significantly since initiating steroids which favors inflammatory myositis. Muscle biopsy early this week was initially  anticipated, but Surgery feels that it would not be safe while on Brilinta. May need to contact Cardiology for further recommendations.  ?- He does not appear to be symptomatic from the small brainstem stroke seen on MRI.  We sus

## 2022-02-07 NOTE — Progress Notes (Signed)
Occupational Therapy Treatment ?Patient Details ?Name: [name] ?MRN: [id number] ?DOB: May 04, 1956 ?Today's Date: 02/07/2022 ? ? ?History of present illness Pt is a 66 y/o male admitted secondary to progressive UE and LE weakness. Workup pending, however, differential including myositis from statin use. MRI showed acute R pontine infarct. PMH includes DM, CAD, HTN, and, MI. ?  ?OT comments ? Pt is making steady gains. Ambulated with RW and min guard assist in room and hall. Stood for grooming with supervision. Pt attempting to walk without RW when returning to chair, but needing to stabilize on furniture. Shoulder limitations remain, limiting ability to reach hair for styling and washing.   ? ?Recommendations for follow up therapy are one component of a multi-disciplinary discharge planning process, led by the attending physician.  Recommendations may be updated based on patient status, additional functional criteria and insurance authorization. ?   ?Follow Up Recommendations ? Acute inpatient rehab (3hours/day)  ?  ?Assistance Recommended at Discharge Intermittent Supervision/Assistance  ?Patient can return home with the following ? A little help with walking and/or transfers;Assistance with cooking/housework;Help with stairs or ramp for entrance ?  ?Equipment Recommendations ? Tub/shower seat  ?  ?Recommendations for Other Services   ? ?  ?Precautions / Restrictions Precautions ?Precautions: Fall ?Restrictions ?Weight Bearing Restrictions: No  ? ? ?  ? ?Mobility Bed Mobility ?  ?  ?  ?  ?  ?  ?  ?General bed mobility comments: in chair ?  ? ?Transfers ?Overall transfer level: Needs assistance ?Equipment used: None ?Transfers: Sit to/from Stand ?Sit to Stand: Supervision ?  ?  ?  ?  ?  ?  ?  ?  ?Balance Overall balance assessment: Needs assistance ?  ?Sitting balance-Leahy Scale: Good ?  ?  ?  ?Standing balance-Leahy Scale: Good ?Standing balance comment: fair static, poor dynamic ?  ?  ?  ?  ?  ?  ?  ?  ?  ?  ?   ?   ? ?ADL either performed or assessed with clinical judgement  ? ?ADL Overall ADL's : Needs assistance/impaired ?  ?  ?Grooming: Oral care;Standing;Supervision/safety ?  ?  ?  ?  ?  ?  ?  ?  ?  ?Toilet Transfer: Min guard;Ambulation;Rolling walker (2 wheels) ?  ?  ?  ?  ?  ?Functional mobility during ADLs: Min guard;Rolling walker (2 wheels) ?  ?  ? ?Extremity/Trunk Assessment   ?  ?  ?  ?  ?  ? ?Vision   ?  ?  ?Perception   ?  ?Praxis   ?  ? ?Cognition Arousal/Alertness: Awake/alert ?Behavior During Therapy: [facility] for tasks assessed/performed ?Overall Cognitive Status: Within Functional Limits for tasks assessed ?  ?  ?  ?  ?  ?  ?  ?  ?  ?  ?  ?  ?  ?  ?  ?  ?  ?  ?  ?   ?Exercises   ? ?  ?Shoulder Instructions   ? ? ?  ?General Comments    ? ? ?Pertinent Vitals/ Pain       Pain Assessment ?Pain Assessment: No/denies pain ? ?Home Living   ?  ?  ?  ?  ?  ?  ?  ?  ?  ?  ?  ?  ?  ?  ?  ?  ?  ?  ? ?  ?Prior Functioning/Environment    ?  ?  ?  ?   ? ?  Frequency ? Min 2X/week  ? ? ? ? ?  ?Progress Toward Goals ? ?OT Goals(current goals can now be found in the care plan section) ? Progress towards OT goals: Progressing toward goals ? ?Acute Rehab OT Goals ?OT Goal Formulation: With patient ?Time For Goal Achievement: 02/17/22 ?Potential to Achieve Goals: Good  ?Plan Discharge plan remains appropriate   ? ?Co-evaluation ? ? ?   ?  ?  ?  ?  ? ?  ?AM-PAC OT "6 Clicks" Daily Activity     ?Outcome Measure ? ? Help from another person eating meals?: None ?Help from another person taking care of personal grooming?: A Little ?Help from another person toileting, which includes using toliet, bedpan, or urinal?: A Little ?Help from another person bathing (including washing, rinsing, drying)?: A Little ?Help from another person to put on and taking off regular upper body clothing?: None ?Help from another person to put on and taking off regular lower body clothing?: A Little ?6 Click Score: 20 ? ?  ?End of Session Equipment Utilized  During Treatment: Rolling walker (2 wheels);Gait belt ? ?OT Visit Diagnosis: Unsteadiness on feet (R26.81);Other abnormalities of gait and mobility (R26.89);Muscle weakness (generalized) (M62.81) ?  ?Activity Tolerance Patient tolerated treatment well ?  ?Patient Left in chair;with call bell/phone within reach ?  ?Nurse Communication   ?  ? ?   ? ?Time: 1610-9604 ?OT Time Calculation (min): 16 min ? ?Charges: OT General Charges ?$OT Visit: 1 Visit ?OT Treatments ?$Self Care/Home Management : 8-22 mins ? ?Nestor Lewandowsky, OTR/L ?Acute Rehabilitation Services ?Pager: [phone] ?Office: [phone]  ? ?Malka So ?02/07/2022, 10:16 AM ?

## 2022-02-07 NOTE — Progress Notes (Signed)
? [name]  YWV:[id number] DOB: [birth year] DOA: 02/02/2022 ?PCP: Greig Right, MD   ? ?Brief Narrative:  ?66yo with a history of CAD status post DES to ostial RCA 12/28/2021, DM2, and HTN who presented to the ED with progressive weakness in both lower extremities and upper extremities.  He had recently been discharged from the hospital 12/29/2021 after suffering an NSTEMI at which time a DES was placed to the ostial RCA.  He was not started on a statin at that time due to elevated LFTs.  In subsequent follow-up however, atorvastatin 20 mg daily was initiated, and the patient has been on this for approximately 5 weeks.  During this time, he has developed progressive weakness of the proximal muscles of both legs and arms to the point that he requires assistance from family to mobilize, and also reports an unintentional 30 pound weight loss.  In the ER CK was found to be 7291.  LFTs were also elevated. ? ?Consultants:  ?Neurology ? ?Code Status: Full Code  ? ?DVT prophylaxis: ?Lovenox ? ?Interim Hx: ?Afebrile.  Vital signs stable.  CBG reasonably controlled.  CK improving on high-dose steroid.  Muscle biopsy not able to be accomplished while patient on Brilinta, and Brilinta not able to be stopped in close proximity to placement of DES stent.  Able to ambulate in the hallway with therapy today showing signs of improvement already, but lives independently and still not stable enough to discharge straight home.  PT/OT continue to recommend CIR and I agree. ? ?Assessment & Plan: ? 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 Leonel Ramsay ?-Will receive last dose of high-dose Solu-Medrol today ?- Dr. Leonel Ramsay with Neuro to guide  workup  ? ?Acute right pontine CVA ?-MRI brain noted acute infarct R pons ?-CTa head/neck notes extensive atherosclerosis w/ at least 70% R ICA stenosis and 50% proximal L ICA stenosis as well as B vertebral origin stenosis of 50% on R and 40% on L -carotid Dopplers however suggest much less severe disease ?-Continue ASA + Brilinta ?-A1c 7.4  ?-LDL 191 (12/28/21) and 98 now (due to statin) ?-Neuro feels this is an incidental finding and not related to this presentation -no plan for further investigation during this hospital stay ? ?Cervical spine neuroforaminal stenosis ?Noted on MRI cervical spine - MRI of thoracic and lumbar spine w/o acute findings that fit the clinical situation, but confirmed multiple areas of spinal disease with facet impingement - will need outpatient clinical monitoring ? ?CAD s/p DES to ostial RCA 12/28/2021 ?-Continue aspirin and Brilinta -not safe to hold Brilinta presently due to recent deployment of DES ?-Continue Coreg ? ?Elevated LFTs ?CT A/P without hepatobiliary abnormality -history suggest this was likely the reason his initial trial of statin was discontinued -LFTs steadily improving in absence of statin -will need to investigate other options for treatment in the outpatient setting, with possible referral to lipid specialist ? ?Mild hypokalemia ?Likely simply due to poor intake -corrected with supplementation -magnesium is normal ? ?Type 2 diabetes mellitus  ?A1c recently 7.4 -CBG reasonably controlled at present -anticipate hyperglycemia with high-dose steroids, but thus far able to be reasonably controlled with SSI ? ?Hyperlipidemia ?Holding statin due to above -see discussion above ? ?6  mm right upper lobe pulmonary nodule ?Noted on scan during this admission and also present on prior exams with radiologist stating stability over time allows [place] to safely consider this to be a benign finding ? ?Family Communication: No family present at time of exam today ?Disposition: From home  -ready for CIR when bed available ? ?Objective: ?Blood pressure 128/86, pulse 79, temperature 97.9 ?F (36.6 ?C), temperature source Oral, resp. rate 16, SpO2 93 %. ?No intake or output data in the 24 hours ending 02/07/22 0958 ? ?There were no vitals filed for this visit. ? ?Examination: ?General: No acute respiratory distress ?Lungs: Clear to auscultation bilaterally -no wheezing ?Cardiovascular: RRR without murmur or rub ?Abdomen: Nontender, nondistended, soft, bowel sounds positive, no rebound, no ascites, no appreciable mass ?Extremities: No edema BLE ? ?CBC: ?Recent Labs  ?Lab 02/02/22 ?1702 02/03/22 ?0221 02/05/22 ?0143  ?WBC 10.4 10.8* 11.8*  ?NEUTROABS 6.1  --   --   ?HGB 14.2 12.7* 13.1  ?HCT 42.5 38.9* 39.4  ?MCV 96.2 96.8 95.2  ?PLT 321 273 267  ? ? ?Basic Metabolic Panel: ?Recent Labs  ?Lab 02/04/22 ?0242 02/05/22 ?0143 02/06/22 ?2947 02/07/22 ?0152  ?NA 139 136 136 139  ?K 3.8 4.3 4.6 4.2  ?CL 105 105 106 107  ?CO2 30 25 20* 25  ?GLUCOSE 111* 127* 289* 221*  ?BUN 15 21 26* 29*  ?CREATININE 0.46* 0.52* 0.68 0.51*  ?CALCIUM 9.4 9.1 9.2 9.2  ?MG 1.9  --   --  2.1  ?PHOS  --   --   --  3.0  ? ? ?GFR: ?CrCl cannot be calculated (Unknown ideal weight.). ? ?Liver Function Tests: ?Recent Labs  ?Lab 02/03/22 ?0221 02/05/22 ?0143 02/06/22 ?6546 02/07/22 ?0152  ?AST 140* 110* 99* 65*  ?ALT 317* 284* 288* 262*  ?ALKPHOS 52 58 59 52  ?BILITOT 0.9 0.9 0.9 0.9  ?PROT 6.1* 6.0* 6.5 6.4*  ?ALBUMIN 3.5 3.5 3.6 3.6  ? ? ? ?Cardiac Enzymes: ?Recent Labs  ?Lab 02/03/22 ?0221 02/04/22 ?0242 02/05/22 ?0143 02/06/22 ?5035 02/07/22 ?0152  ?CKTOTAL 1,940* 5,461* 4,051* 3,501* 1,841*  ? ? ?Scheduled Meds: ? amLODipine  10 mg Oral q morning  ? aspirin EC  81 mg Oral Daily  ? carvedilol  12.5 mg Oral BID WC  ? cholecalciferol  1,000 Units Oral Daily  ? citalopram  20 mg Oral Daily  ? cyclobenzaprine  10 mg Oral QHS  ? enoxaparin (LOVENOX) injection  40 mg Subcutaneous Q24H  ? insulin aspart  0-20 Units Subcutaneous TID WC  ?  insulin aspart  0-5 Units Subcutaneous QHS  ? insulin glargine-yfgn  20 Units Subcutaneous BID  ? lisinopril  40 mg Oral q morning  ? multivitamin with minerals  1 tablet Oral Daily  ? pantoprazole  40 mg Oral BID  ? potassium chloride  40 mEq Oral Daily  ? ticagrelor  90 mg Oral BID  ? ?Continuous Infusions: ? methylPREDNISolone (SOLU-MEDROL) injection 1,000 mg (02/07/22 0904)  ? ? ? LOS: 3 days  ? ?Cherene Altes, MD ?[facility] ?Office  [phone] ?Pager - Text Page per Shea Evans ? ?If 7PM-7AM, please contact night-coverage per Amion ?02/07/2022, 9:58 AM ? ? ? ? ?

## 2022-02-07 NOTE — Progress Notes (Signed)
Physical Therapy Treatment ?Patient Details ?Name: [name] ?MRN: [id number] ?DOB: [birth year] ?Today's Date: 02/07/2022 ? ? ?History of Present Illness Pt is a 66 y/o male admitted secondary to progressive UE and LE weakness. Workup pending, however, differential including myositis from statin use. MRI showed acute R pontine infarct. PMH includes DM, CAD, HTN, and, MI. ? ?  ?PT Comments  ? ? Patient progressing well, but does need to be independent to return home alone. Continues with decreased balance with dynamic standing balance and therefore using a RW for ambulation. Noted LLE weaker than RLE during all exercises and during gait.  ?   ?Recommendations for follow up therapy are one component of a multi-disciplinary discharge planning process, led by the attending physician.  Recommendations may be updated based on patient status, additional functional criteria and insurance authorization. ? ?Follow Up Recommendations ? Acute inpatient rehab (3hours/day) ?  ?  ?Assistance Recommended at Discharge Intermittent Supervision/Assistance  ?Patient can return home with the following A little help with walking and/or transfers;A little help with bathing/dressing/bathroom;Assistance with cooking/housework;Help with stairs or ramp for entrance ?  ?Equipment Recommendations ? Rolling walker (2 wheels)  ?  ?Recommendations for Other Services   ? ? ?  ?Precautions / Restrictions Precautions ?Precautions: Fall ?Restrictions ?Weight Bearing Restrictions: No  ?  ? ?Mobility ? Bed Mobility ?  ?  ?  ?  ?  ?  ?  ?General bed mobility comments: in chair ?  ? ?Transfers ?Overall transfer level: Needs assistance ?Equipment used: None ?Transfers: Sit to/from Stand ?Sit to Stand: Supervision ?  ?  ?  ?  ?  ?General transfer comment: repeated x 10 for strengthening with no imbalance noted; +difficulty controlling descent without use of UEs due to LE weakness ?  ? ?Ambulation/Gait ?Ambulation/Gait assistance: Min guard ?Gait Distance  (Feet): 150 Feet ?Assistive device: Rolling walker (2 wheels) ?Gait Pattern/deviations: Step-through pattern, Decreased step length - left, Knee flexed in stance - left ?Gait velocity: decr ?  ?  ?General Gait Details: pt able to lighten UE support via RW however this makes gait deficits more apparent (shorter rt step length, left knee flexed in stance as he fatigues) ? ? ?Stairs ?  ?  ?  ?  ?  ? ? ?Wheelchair Mobility ?  ? ?Modified Rankin (Stroke Patients Only) ?Modified Rankin (Stroke Patients Only) ?Pre-Morbid Rankin Score: No symptoms ?Modified Rankin: Moderately severe disability ? ? ?  ?Balance Overall balance assessment: Needs assistance ?Sitting-balance support: Feet supported ?Sitting balance-Leahy Scale: Good ?  ?  ?Standing balance support: During functional activity, Bilateral upper extremity supported, Reliant on assistive device for balance ?Standing balance-Leahy Scale: Fair ?Standing balance comment: needs UE support for dynamic standing activities ?  ?  ?  ?  ?  ?  ?  ?  ?  ?  ?  ?  ? ?  ?Cognition Arousal/Alertness: Awake/alert ?Behavior During Therapy: [facility] for tasks assessed/performed ?Overall Cognitive Status: Within Functional Limits for tasks assessed ?  ?  ?  ?  ?  ?  ?  ?  ?  ?  ?  ?  ?  ?  ?  ?  ?  ?  ?  ? ?  ?Exercises General Exercises - Lower Extremity ?Long Arc Quad: AROM, Both, 10 reps, Seated ?Hip ABduction/ADduction: AROM, Both, 10 reps, Standing ?Toe Raises: AAROM, Both, 10 reps, Standing (assist for balance) ?Heel Raises: AAROM, Both, 10 reps, Standing (assist for balance) ? ?  ?General Comments   ?  ?  ? ?  Pertinent Vitals/Pain Pain Assessment ?Pain Assessment: No/denies pain  ? ? ?Home Living   ?  ?  ?  ?  ?  ?  ?  ?  ?  ?   ?  ?Prior Function    ?  ?  ?   ? ?PT Goals (current goals can now be found in the care plan section) Acute Rehab PT Goals ?Patient Stated Goal: to be independent ?PT Goal Formulation: With patient ?Time For Goal Achievement: 02/17/22 ?Potential to Achieve  Goals: Good ?Progress towards PT goals: Progressing toward goals ? ?  ?Frequency ? ? ? Min 3X/week ? ? ? ?  ?PT Plan Current plan remains appropriate  ? ? ?Co-evaluation   ?  ?  ?  ?  ? ?  ?AM-PAC PT "6 Clicks" Mobility   ?Outcome Measure ? Help needed turning from your back to your side while in a flat bed without using bedrails?: None ?Help needed moving from lying on your back to sitting on the side of a flat bed without using bedrails?: A Little ?Help needed moving to and from a bed to a chair (including a wheelchair)?: A Little ?Help needed standing up from a chair using your arms (e.g., wheelchair or bedside chair)?: A Little ?Help needed to walk in hospital room?: A Little ?Help needed climbing 3-5 steps with a railing? : A Little ?6 Click Score: 19 ? ?  ?End of Session Equipment Utilized During Treatment: Gait belt ?Activity Tolerance: Patient tolerated treatment well ?Patient left: in chair;with call bell/phone within reach ?Nurse Communication: Mobility status ?PT Visit Diagnosis: Unsteadiness on feet (R26.81);Muscle weakness (generalized) (M62.81);Other abnormalities of gait and mobility (R26.89) ?  ? ? ?Time: 1610-9604 ?PT Time Calculation (min) (ACUTE ONLY): 14 min ? ?Charges:  $Neuromuscular Re-education: 8-22 mins          ?          ? ? ?Arby Barrette, PT ?Acute Rehabilitation Services  ?Pager [phone] ?Office [phone] ? ? ? ?[name] ?02/07/2022, 11:56 AM ? ?

## 2022-02-07 NOTE — Progress Notes (Signed)
Inpatient Rehab Admissions Coordinator:  ? ?I do not have a CIR bed for this pt. Today as I continue to await insurance auth. Pt. Updated. Will follow for potential admit pending insurance auth and bed availability. ? ?Clemens Catholic, MS, CCC-SLP ?Rehab Admissions Coordinator  ?[phone] (celll) ?[phone] (office) ? ?

## 2022-02-08 ENCOUNTER — Encounter (HOSPITAL_COMMUNITY): Payer: Self-pay | Admitting: Physical Medicine & Rehabilitation

## 2022-02-08 ENCOUNTER — Inpatient Hospital Stay (HOSPITAL_COMMUNITY)
Admission: RE | Admit: 2022-02-08 | Discharge: 2022-02-16 | DRG: 945 | Disposition: A | Discharge status: Home / Self Care | Payer: Medicare Other | Source: Intra-hospital | Attending: Physical Medicine & Rehabilitation | Admitting: Physical Medicine & Rehabilitation

## 2022-02-08 DIAGNOSIS — T380X5A Adverse effect of glucocorticoids and synthetic analogues, initial encounter: Secondary | ICD-10-CM | POA: Diagnosis not present

## 2022-02-08 DIAGNOSIS — G7249 Other inflammatory and immune myopathies, not elsewhere classified: Secondary | ICD-10-CM | POA: Diagnosis not present

## 2022-02-08 DIAGNOSIS — D72829 Elevated white blood cell count, unspecified: Secondary | ICD-10-CM | POA: Diagnosis not present

## 2022-02-08 DIAGNOSIS — E119 Type 2 diabetes mellitus without complications: Secondary | ICD-10-CM | POA: Diagnosis not present

## 2022-02-08 DIAGNOSIS — Z888 Allergy status to other drugs, medicaments and biological substances status: Secondary | ICD-10-CM | POA: Diagnosis not present

## 2022-02-08 DIAGNOSIS — M1990 Unspecified osteoarthritis, unspecified site: Secondary | ICD-10-CM | POA: Diagnosis not present

## 2022-02-08 DIAGNOSIS — R7989 Other specified abnormal findings of blood chemistry: Secondary | ICD-10-CM | POA: Diagnosis not present

## 2022-02-08 DIAGNOSIS — Z794 Long term (current) use of insulin: Secondary | ICD-10-CM

## 2022-02-08 DIAGNOSIS — M609 Myositis, unspecified: Secondary | ICD-10-CM | POA: Diagnosis not present

## 2022-02-08 DIAGNOSIS — M3320 Polymyositis, organ involvement unspecified: Secondary | ICD-10-CM | POA: Diagnosis present

## 2022-02-08 DIAGNOSIS — Z955 Presence of coronary angioplasty implant and graft: Secondary | ICD-10-CM

## 2022-02-08 DIAGNOSIS — Z7982 Long term (current) use of aspirin: Secondary | ICD-10-CM | POA: Diagnosis not present

## 2022-02-08 DIAGNOSIS — Z8673 Personal history of transient ischemic attack (TIA), and cerebral infarction without residual deficits: Secondary | ICD-10-CM | POA: Diagnosis not present

## 2022-02-08 DIAGNOSIS — Z7902 Long term (current) use of antithrombotics/antiplatelets: Secondary | ICD-10-CM | POA: Diagnosis not present

## 2022-02-08 DIAGNOSIS — I25810 Atherosclerosis of coronary artery bypass graft(s) without angina pectoris: Secondary | ICD-10-CM

## 2022-02-08 DIAGNOSIS — E11649 Type 2 diabetes mellitus with hypoglycemia without coma: Secondary | ICD-10-CM | POA: Diagnosis not present

## 2022-02-08 DIAGNOSIS — I10 Essential (primary) hypertension: Secondary | ICD-10-CM | POA: Diagnosis present

## 2022-02-08 DIAGNOSIS — Z974 Presence of external hearing-aid: Secondary | ICD-10-CM | POA: Diagnosis not present

## 2022-02-08 DIAGNOSIS — E1142 Type 2 diabetes mellitus with diabetic polyneuropathy: Secondary | ICD-10-CM | POA: Diagnosis present

## 2022-02-08 DIAGNOSIS — Y92230 Patient room in hospital as the place of occurrence of the external cause: Secondary | ICD-10-CM | POA: Diagnosis not present

## 2022-02-08 DIAGNOSIS — H9193 Unspecified hearing loss, bilateral: Secondary | ICD-10-CM | POA: Diagnosis present

## 2022-02-08 DIAGNOSIS — I2510 Atherosclerotic heart disease of native coronary artery without angina pectoris: Secondary | ICD-10-CM | POA: Diagnosis not present

## 2022-02-08 DIAGNOSIS — Z8701 Personal history of pneumonia (recurrent): Secondary | ICD-10-CM

## 2022-02-08 DIAGNOSIS — Z96651 Presence of right artificial knee joint: Secondary | ICD-10-CM | POA: Diagnosis present

## 2022-02-08 DIAGNOSIS — I6350 Cerebral infarction due to unspecified occlusion or stenosis of unspecified cerebral artery: Secondary | ICD-10-CM

## 2022-02-08 DIAGNOSIS — Z7984 Long term (current) use of oral hypoglycemic drugs: Secondary | ICD-10-CM | POA: Diagnosis not present

## 2022-02-08 DIAGNOSIS — Z79899 Other long term (current) drug therapy: Secondary | ICD-10-CM | POA: Diagnosis not present

## 2022-02-08 DIAGNOSIS — Z8249 Family history of ischemic heart disease and other diseases of the circulatory system: Secondary | ICD-10-CM | POA: Diagnosis not present

## 2022-02-08 DIAGNOSIS — R5381 Other malaise: Secondary | ICD-10-CM | POA: Diagnosis not present

## 2022-02-08 DIAGNOSIS — M6281 Muscle weakness (generalized): Secondary | ICD-10-CM | POA: Diagnosis not present

## 2022-02-08 DIAGNOSIS — Z87442 Personal history of urinary calculi: Secondary | ICD-10-CM

## 2022-02-08 LAB — GLUCOSE, CAPILLARY
Glucose-Capillary: 196 mg/dL — ABNORMAL HIGH (ref 70–99)
Glucose-Capillary: 200 mg/dL — ABNORMAL HIGH (ref 70–99)
Glucose-Capillary: 154 mg/dL — ABNORMAL HIGH (ref 70–99)
Glucose-Capillary: 132 mg/dL — ABNORMAL HIGH (ref 70–99)

## 2022-02-08 LAB — COMPREHENSIVE METABOLIC PANEL
ALT: 244 U/L — ABNORMAL HIGH (ref 0–44)
AST: 59 U/L — ABNORMAL HIGH (ref 15–41)
Albumin: 3.4 g/dL — ABNORMAL LOW (ref 3.5–5.0)
Alkaline Phosphatase: 56 U/L (ref 38–126)
Anion gap: 9 (ref 5–15)
BUN: 29 mg/dL — ABNORMAL HIGH (ref 8–23)
CO2: 24 mmol/L (ref 22–32)
Calcium: 9 mg/dL (ref 8.9–10.3)
Chloride: 104 mmol/L (ref 98–111)
Creatinine, Ser: 0.57 mg/dL — ABNORMAL LOW (ref 0.61–1.24)
GFR, Estimated: >60 mL/min (ref >60)
Glucose, Bld: 239 mg/dL — ABNORMAL HIGH (ref 70–99)
Potassium: 4.4 mmol/L (ref 3.5–5.1)
Sodium: 137 mmol/L (ref 135–145)
Total Bilirubin: 0.8 mg/dL (ref 0.3–1.2)
Total Protein: 6 g/dL — ABNORMAL LOW (ref 6.5–8.1)

## 2022-02-08 LAB — CK: Total CK: 1195 U/L — ABNORMAL HIGH (ref 49–397)

## 2022-02-08 MED ORDER — INSULIN ASPART 100 UNIT/ML IJ SOLN
0.0000 [IU] | Freq: Three times a day (TID) | INTRAMUSCULAR | Status: DC
  Administered 2022-02-08: 4 [IU] via SUBCUTANEOUS
  Administered 2022-02-09: 8 [IU] via SUBCUTANEOUS
  Administered 2022-02-10: 4 [IU] via SUBCUTANEOUS
  Administered 2022-02-11: 11 [IU] via SUBCUTANEOUS
  Administered 2022-02-11: 3 [IU] via SUBCUTANEOUS
  Administered 2022-02-12: 7 [IU] via SUBCUTANEOUS
  Administered 2022-02-12: 3 [IU] via SUBCUTANEOUS
  Administered 2022-02-13: 4 [IU] via SUBCUTANEOUS
  Administered 2022-02-13: 3 [IU] via SUBCUTANEOUS
  Administered 2022-02-14: 4 [IU] via SUBCUTANEOUS
  Administered 2022-02-14: 3 [IU] via SUBCUTANEOUS
  Administered 2022-02-15 (×2): 4 [IU] via SUBCUTANEOUS

## 2022-02-08 MED ORDER — ALUM & MAG HYDROXIDE-SIMETH 200-200-20 MG/5ML PO SUSP: 30.0000 mL | ORAL | Status: DC | PRN

## 2022-02-08 MED ORDER — AMLODIPINE BESYLATE 10 MG PO TABS
10.0000 mg | ORAL_TABLET | Freq: Every day | ORAL | Status: DC
  Administered 2022-02-09 – 2022-02-16 (×8): 10 mg via ORAL
  Filled 2022-02-08 (×8): qty 1

## 2022-02-08 MED ORDER — POLYETHYLENE GLYCOL 3350 17 G PO PACK
17.0000 g | PACK | Freq: Every day | ORAL | Status: DC | PRN
Start: 2022-02-08 — End: 2022-02-16
  Filled 2022-02-08: qty 1

## 2022-02-08 MED ORDER — PROCHLORPERAZINE MALEATE 5 MG PO TABS: 5.0000 mg | ORAL_TABLET | Freq: Four times a day (QID) | ORAL | Status: DC | PRN

## 2022-02-08 MED ORDER — TICAGRELOR 90 MG PO TABS
90.0000 mg | ORAL_TABLET | Freq: Two times a day (BID) | ORAL | Status: DC
  Administered 2022-02-08 – 2022-02-16 (×16): 90 mg via ORAL
  Filled 2022-02-08 (×16): qty 1

## 2022-02-08 MED ORDER — GUAIFENESIN-DM 100-10 MG/5ML PO SYRP: 5.0000 mL | ORAL_SOLUTION | Freq: Four times a day (QID) | ORAL | Status: DC | PRN

## 2022-02-08 MED ORDER — ACETAMINOPHEN 325 MG PO TABS: 325.0000 mg | ORAL_TABLET | ORAL | Status: DC | PRN

## 2022-02-08 MED ORDER — METFORMIN HCL 500 MG PO TABS
1000.0000 mg | ORAL_TABLET | Freq: Two times a day (BID) | ORAL | Status: DC
  Administered 2022-02-08 – 2022-02-16 (×16): 1000 mg via ORAL
  Filled 2022-02-08 (×16): qty 2

## 2022-02-08 MED ORDER — ADULT MULTIVITAMIN W/MINERALS CH
1.0000 | ORAL_TABLET | Freq: Every day | ORAL | Status: DC
  Administered 2022-02-09 – 2022-02-16 (×8): 1 via ORAL
  Filled 2022-02-08 (×8): qty 1

## 2022-02-08 MED ORDER — CYCLOBENZAPRINE HCL 5 MG PO TABS
10.0000 mg | ORAL_TABLET | Freq: Every day | ORAL | Status: DC
  Administered 2022-02-08 – 2022-02-15 (×8): 10 mg via ORAL
  Filled 2022-02-08 (×8): qty 2

## 2022-02-08 MED ORDER — INSULIN ASPART 100 UNIT/ML IJ SOLN
0.0000 [IU] | Freq: Every day | INTRAMUSCULAR | Status: DC
  Administered 2022-02-09: 2 [IU] via SUBCUTANEOUS
  Administered 2022-02-11: 3 [IU] via SUBCUTANEOUS
  Administered 2022-02-13: 2 [IU] via SUBCUTANEOUS
  Administered 2022-02-14: 3 [IU] via SUBCUTANEOUS
  Administered 2022-02-15: 2 [IU] via SUBCUTANEOUS

## 2022-02-08 MED ORDER — POTASSIUM CHLORIDE CRYS ER 20 MEQ PO TBCR
40.0000 meq | EXTENDED_RELEASE_TABLET | Freq: Every day | ORAL | Status: DC
  Administered 2022-02-09 – 2022-02-16 (×8): 40 meq via ORAL
  Filled 2022-02-08 (×8): qty 2

## 2022-02-08 MED ORDER — CARVEDILOL 12.5 MG PO TABS
12.5000 mg | ORAL_TABLET | Freq: Two times a day (BID) | ORAL | Status: DC
  Administered 2022-02-08 – 2022-02-16 (×16): 12.5 mg via ORAL
  Filled 2022-02-08 (×17): qty 1

## 2022-02-08 MED ORDER — ASPIRIN 81 MG PO TBEC
81.0000 mg | DELAYED_RELEASE_TABLET | Freq: Every day | ORAL | Status: DC
  Administered 2022-02-09 – 2022-02-16 (×8): 81 mg via ORAL
  Filled 2022-02-08 (×8): qty 1

## 2022-02-08 MED ORDER — ENOXAPARIN SODIUM 40 MG/0.4ML IJ SOSY
40.0000 mg | PREFILLED_SYRINGE | INTRAMUSCULAR | Status: DC
  Administered 2022-02-09 – 2022-02-15 (×7): 40 mg via SUBCUTANEOUS
  Filled 2022-02-08 (×7): qty 0.4

## 2022-02-08 MED ORDER — PROCHLORPERAZINE EDISYLATE 10 MG/2ML IJ SOLN: 5.0000 mg | Freq: Four times a day (QID) | INTRAMUSCULAR | Status: DC | PRN

## 2022-02-08 MED ORDER — CITALOPRAM HYDROBROMIDE 20 MG PO TABS
20.0000 mg | ORAL_TABLET | Freq: Every day | ORAL | Status: DC
  Administered 2022-02-09 – 2022-02-16 (×8): 20 mg via ORAL
  Filled 2022-02-08 (×8): qty 1

## 2022-02-08 MED ORDER — FLEET ENEMA 7-19 GM/118ML RE ENEM: 1.0000 | ENEMA | Freq: Once | RECTAL | Status: DC | PRN

## 2022-02-08 MED ORDER — TRAZODONE HCL 50 MG PO TABS
25.0000 mg | ORAL_TABLET | Freq: Every evening | ORAL | Status: DC | PRN
  Administered 2022-02-11: 50 mg via ORAL
  Filled 2022-02-08: qty 1

## 2022-02-08 MED ORDER — INSULIN ASPART 100 UNIT/ML IJ SOLN
5.0000 [IU] | Freq: Three times a day (TID) | INTRAMUSCULAR | Status: DC
  Administered 2022-02-08 – 2022-02-09 (×4): 5 [IU] via SUBCUTANEOUS

## 2022-02-08 MED ORDER — NITROGLYCERIN 0.4 MG SL SUBL: 0.4000 mg | SUBLINGUAL_TABLET | SUBLINGUAL | Status: DC | PRN

## 2022-02-08 MED ORDER — BISACODYL 10 MG RE SUPP: 10.0000 mg | Freq: Every day | RECTAL | Status: DC | PRN

## 2022-02-08 MED ORDER — INSULIN GLARGINE-YFGN 100 UNIT/ML ~~LOC~~ SOLN
20.0000 [IU] | Freq: Two times a day (BID) | SUBCUTANEOUS | Status: DC
Start: 2022-02-08 — End: 2022-02-09
  Administered 2022-02-08: 20 [IU] via SUBCUTANEOUS
  Filled 2022-02-08 (×3): qty 0.2

## 2022-02-08 MED ORDER — LISINOPRIL 20 MG PO TABS
40.0000 mg | ORAL_TABLET | Freq: Every morning | ORAL | Status: DC
  Administered 2022-02-09 – 2022-02-15 (×7): 40 mg via ORAL
  Filled 2022-02-08 (×7): qty 2

## 2022-02-08 MED ORDER — PREDNISONE 20 MG PO TABS
80.0000 mg | ORAL_TABLET | Freq: Every day | ORAL | Status: DC
  Filled 2022-02-08: qty 4

## 2022-02-08 MED ORDER — DIPHENHYDRAMINE HCL 12.5 MG/5ML PO ELIX: 12.5000 mg | ORAL_SOLUTION | Freq: Four times a day (QID) | ORAL | Status: DC | PRN

## 2022-02-08 MED ORDER — PROCHLORPERAZINE 25 MG RE SUPP: 12.5000 mg | Freq: Four times a day (QID) | RECTAL | Status: DC | PRN

## 2022-02-08 MED ORDER — VITAMIN D 25 MCG (1000 UNIT) PO TABS
1000.0000 [IU] | ORAL_TABLET | Freq: Every day | ORAL | Status: DC
  Administered 2022-02-09 – 2022-02-16 (×8): 1000 [IU] via ORAL
  Filled 2022-02-08 (×8): qty 1

## 2022-02-08 NOTE — Progress Notes (Signed)
Inpatient Rehab Admissions Coordinator:  ? ?I have  a CIR bed and can admit this Pt. Today, RN may call report to [phone]. ? ?Clemens Catholic, MS, CCC-SLP ?Rehab Admissions Coordinator  ?[phone] (celll) ?[phone] (office) ? ? ? ?

## 2022-02-08 NOTE — Progress Notes (Signed)
Subjective: ?Day 3/3 of Solumedrol completed yesterday. Subjectively, his strength has plateaued.  ? ?Objective: ?Current vital signs: ?BP 137/76 (BP Location: Right Arm)   Pulse 76   Temp 97.8 ?F (36.6 ?C) (Oral)   Resp 17   SpO2 95%  ?Vital signs in last 24 hours: ?Temp:  [97.7 ?F (36.5 ?C)-98.5 ?F (36.9 ?C)] 97.8 ?F (36.6 ?C) (05/16 0740) ?Pulse Rate:  [69-76] 76 (05/16 0740) ?Resp:  [16-19] 17 (05/16 0740) ?BP: (117-153)/(57-85) 137/76 (05/16 0740) ?SpO2:  [92 %-96 %] 95 % (05/16 0740) ? ?Intake/Output from previous day: ?05/15 0701 - 05/16 0700 ?In: 250 [P.O.:200; IV Piggyback:50] ?Out: -  ?Intake/Output this shift: ?No intake/output data recorded. ?Nutritional status:  ?Diet Order   ? ?       ?  Diet Carb Modified Fluid consistency: Thin; Room service appropriate? Yes  Diet effective now       ?  ? ?  ?  ? ?  ? ?HEENT: [place]/AT ?Lungs: Respirations unlabored ?Ext: Warm and well perfused ?  ?Neurologic Exam: ?Ment: Awake and alert. Speech fluent with intact comprehension.  ?CN: EOMI. Face symmetric with normal expressivity. Phonation intact without dysphonia or hoarseness.  ?Motor:  ?LUE 3/5 deltoid, able to elevate to 30 degrees above the horizontal plane, but no further. 4/5 triceps and biceps. 5/5 grip, 4+/5 hand intrinsics.  ?RUE: 3/5 deltoid, able to elevate to 30 degrees above the horizontal plane, but no further. 4-/5 triceps and biceps. 5/5 grip, 4+/5 hand intrinsics. ?LLE 2-3/5 hip flexion, 4/5 knee extension, 5/5 ADF ?RLE: 2-3/5 hip flexion, 4/5 knee extension, 5/5 ADF ?Cerebellar: No ataxia noted.  ? ?Lab Results: ?Results for orders placed or performed during the hospital encounter of 02/02/22 (from the past 48 hour(s))  ?Glucose, capillary     Status: Abnormal  ? Collection Time: 02/06/22 12:09 PM  ?Result Value Ref Range  ? Glucose-Capillary 272 (H) 70 - 99 mg/dL  ?  Comment: Glucose reference range applies only to samples taken after fasting for at least 8 hours.  ?Glucose, capillary     Status:  Abnormal  ? Collection Time: 02/06/22  5:04 PM  ?Result Value Ref Range  ? Glucose-Capillary 287 (H) 70 - 99 mg/dL  ?  Comment: Glucose reference range applies only to samples taken after fasting for at least 8 hours.  ?Glucose, capillary     Status: Abnormal  ? Collection Time: 02/06/22  9:24 PM  ?Result Value Ref Range  ? Glucose-Capillary 200 (H) 70 - 99 mg/dL  ?  Comment: Glucose reference range applies only to samples taken after fasting for at least 8 hours.  ?Comprehensive metabolic panel     Status: Abnormal  ? Collection Time: 02/07/22  1:52 AM  ?Result Value Ref Range  ? Sodium 139 135 - 145 mmol/L  ? Potassium 4.2 3.5 - 5.1 mmol/L  ? Chloride 107 98 - 111 mmol/L  ? CO2 25 22 - 32 mmol/L  ? Glucose, Bld 221 (H) 70 - 99 mg/dL  ?  Comment: Glucose reference range applies only to samples taken after fasting for at least 8 hours.  ? BUN 29 (H) 8 - 23 mg/dL  ? Creatinine, Ser 0.51 (L) 0.61 - 1.24 mg/dL  ? Calcium 9.2 8.9 - 10.3 mg/dL  ? Total Protein 6.4 (L) 6.5 - 8.1 g/dL  ? Albumin 3.6 3.5 - 5.0 g/dL  ? AST 65 (H) 15 - 41 U/L  ? ALT 262 (H) 0 - 44 U/L  ? Alkaline  Phosphatase 52 38 - 126 U/L  ? Total Bilirubin 0.9 0.3 - 1.2 mg/dL  ? GFR, Estimated >60 >60 mL/min  ?  Comment: (NOTE) ?Calculated using the CKD-EPI Creatinine Equation (2021) ?  ? Anion gap 7 5 - 15  ?  Comment: Performed at [facility], [place] [address], [place], [place] [address]  ?CK     Status: Abnormal  ? Collection Time: 02/07/22  1:52 AM  ?Result Value Ref Range  ? Total CK 1,841 (H) 49 - 397 U/L  ?  Comment: Performed at [facility], [place] [address], [place], [place] [address]  ?Magnesium     Status: None  ? Collection Time: 02/07/22  1:52 AM  ?Result Value Ref Range  ? Magnesium 2.1 1.7 - 2.4 mg/dL  ?  Comment: Performed at [facility], [place] [address] [place], [place] [address]  ?Phosphorus     Status: None  ? Collection Time: 02/07/22  1:52 AM  ?Result Value Ref Range  ? Phosphorus 3.0 2.5 - 4.6 mg/dL  ?  Comment:  Performed at [facility], [place] [address] [place], [place] [address]  ?Glucose, capillary     Status: Abnormal  ? Collection Time: 02/07/22  8:10 AM  ?Result Value Ref Range  ? Glucose-Capillary 183 (H) 70 - 99 mg/dL  ?  Comment: Glucose reference range applies only to samples taken after fasting for at least 8 hours.  ?Glucose, capillary     Status: Abnormal  ? Collection Time: 02/07/22 11:53 AM  ?Result Value Ref Range  ? Glucose-Capillary 207 (H) 70 - 99 mg/dL  ?  Comment: Glucose reference range applies only to samples taken after fasting for at least 8 hours.  ?Glucose, capillary     Status: Abnormal  ? Collection Time: 02/07/22  4:46 PM  ?Result Value Ref Range  ? Glucose-Capillary 302 (H) 70 - 99 mg/dL  ?  Comment: Glucose reference range applies only to samples taken after fasting for at least 8 hours.  ?Glucose, capillary     Status: Abnormal  ? Collection Time: 02/07/22  9:37 PM  ?Result Value Ref Range  ? Glucose-Capillary 315 (H) 70 - 99 mg/dL  ?  Comment: Glucose reference range applies only to samples taken after fasting for at least 8 hours.  ?Comprehensive metabolic panel     Status: Abnormal  ? Collection Time: 02/08/22  1:41 AM  ?Result Value Ref Range  ? Sodium 137 135 - 145 mmol/L  ? Potassium 4.4 3.5 - 5.1 mmol/L  ? Chloride 104 98 - 111 mmol/L  ? CO2 24 22 - 32 mmol/L  ? Glucose, Bld 239 (H) 70 - 99 mg/dL  ?  Comment: Glucose reference range applies only to samples taken after fasting for at least 8 hours.  ? BUN 29 (H) 8 - 23 mg/dL  ? Creatinine, Ser 0.57 (L) 0.61 - 1.24 mg/dL  ? Calcium 9.0 8.9 - 10.3 mg/dL  ? Total Protein 6.0 (L) 6.5 - 8.1 g/dL  ? Albumin 3.4 (L) 3.5 - 5.0 g/dL  ? AST 59 (H) 15 - 41 U/L  ? ALT 244 (H) 0 - 44 U/L  ? Alkaline Phosphatase 56 38 - 126 U/L  ? Total Bilirubin 0.8 0.3 - 1.2 mg/dL  ? GFR, Estimated >60 >60 mL/min  ?  Comment: (NOTE) ?Calculated using the CKD-EPI Creatinine Equation (2021) ?  ? Anion gap 9 5 - 15  ?  Comment: Performed at  [place], [place] [address], [place], [place] [address]  ?CK     Status: Abnormal  ? Collection Time: 02/08/22  1:41 AM  ?Result Value Ref Range  ? Total CK 1,195 (H) 49 - 397 U/L  ?  Comment: Performed at [facility], [place] [address], [place], [place] [address]  ?Glucose, capillary     Status: Abnormal  ? Collection Time: 02/08/22  7:44 AM  ?Result Value Ref Range  ? Glucose-Capillary 196 (H) 70 - 99 mg/dL  ?  Comment: Glucose reference range applies only to samples taken after fasting for at least 8 hours.  ? ? ?No results found for this or any previous visit (from the past 240 hour(s)). ? ?Lipid Panel ?No results for input(s): CHOL, TRIG, HDL, CHOLHDL, VLDL, LDLCALC in the last 72 hours. ? ?Studies/Results: ?No results found. ? ?Medications: Scheduled: ? amLODipine  10 mg Oral q morning  ? aspirin EC  81 mg Oral Daily  ? carvedilol  12.5 mg Oral BID WC  ? cholecalciferol  1,000 Units Oral Daily  ? citalopram  20 mg Oral Daily  ? cyclobenzaprine  10 mg Oral QHS  ? enoxaparin (LOVENOX) injection  40 mg Subcutaneous Q24H  ? insulin aspart  0-20 Units Subcutaneous TID WC  ? insulin aspart  0-5 Units Subcutaneous QHS  ? insulin glargine-yfgn  20 Units Subcutaneous BID  ? lisinopril  40 mg Oral q morning  ? multivitamin with minerals  1 tablet Oral Daily  ? potassium chloride  40 mEq Oral Daily  ? predniSONE  80 mg Oral Q breakfast  ? ticagrelor  90 mg Oral BID  ? ? ?Assessment: 66 year old male with bilaterally symmetric, progressive proximal >> distal muscle weakness.  He has no sensory symptoms to suggest a neuropathic etiology, and the preservation of reflexes distally would argue against this as well.  The fact that his symptoms were there prior to reinitiation of statin therapy makes me think that anti-HMGCOA antibodies are less likely, though the worsening after reintroduction is concerning. ?- He has improved significantly since initiating steroids which favors inflammatory myositis. Muscle biopsy early this week was  initially anticipated, but Surgery feels that it would not be safe while on Brilinta.  ?- Primary team has spoken with Cardiology. Given the recent placement of his stent and the fact that it is a drug-elutin

## 2022-02-08 NOTE — Progress Notes (Signed)
Inpatient Rehabilitation Admission Medication Review by a Pharmacist ? ?A complete drug regimen review was completed for this patient to identify any potential clinically significant medication issues. ? ?High Risk Drug Classes Is patient taking? Indication by Medication  ?Antipsychotic Yes Compazine- N/V  ?Anticoagulant Yes Lovenox- VTE prophylaxis  ?Antibiotic No   ?Opioid No   ?Antiplatelet Yes Aspirin, ticagrelor- CVA prophylaxis, CAD post-stenting  ?Hypoglycemics/insulin Yes Insulin, metformin- T2DM  ?Vasoactive Medication Yes Coreg, Norvasc, lisinopril- hypertension  ?Chemotherapy No   ?Other Yes Celexa- MDD ?Flexeril- muscle spasms ?Prednisone- polymyositis  ? ? ? ?Type of Medication Issue Identified Description of Issue Recommendation(s)  ?Drug Interaction(s) (clinically significant) ?    ?Duplicate Therapy ?    ?Allergy ?    ?No Medication Administration End Date ?    ?Incorrect Dose ?    ?Additional Drug Therapy Needed ?    ?Significant med changes from prior encounter (inform family/care partners about these prior to discharge).    ?Other ? PTA meds: ?Repatha ?Actos ?HCTZ Restart PTA meds when and if clinically necessary during CIR admission or at time of discharge, if warranted  ? ? ?Clinically significant medication issues were identified that warrant physician communication and completion of prescribed/recommended actions by midnight of the next day:  No ? ? ?Time spent performing this drug regimen review (minutes):  30 ? ? ?Havanna Groner BS, PharmD, BCPS ?Clinical Pharmacist ?02/09/2022 7:10 AM ? ?Contact: [phone] after 3 PM ? ?"Be curious, not judgmental..." -Jamal Maes ?

## 2022-02-08 NOTE — PMR Pre-admission (Signed)
PMR Admission Coordinator Pre-Admission Assessment ? ?Patient: [name] is an 66 y.o., male ?MRN: [id number] ?DOB: Sep 23, 1956 ?Height:   ?Weight:   ? ?Insurance Information ?HMO:     PPO: Yes     PCP:      IPA:      80/20:      OTHER:  ?PRIMARY: Blue Medicare       Policy#: [id number]      Subscriber: pt ?CM Name: Larene Beach      Phone#: [phone]     Fax#: [phone] ? I received approval for CIR admit from [facility] with [facility] on 02/07/22 ?Pre-Cert#: [id number]       Employer:  ?Benefits:  Phone #:      Name:  ?Irene Shipper Date: 12/25/2021 - still active ?Deductible: does not have one ?OOP Max: $5,650 ($157.69 met) ?CIR: $335/day co-pay for days 1-6 ?SNF: $0.00 Copayment per day for days 1-20; $196 Copayment per day for days 21-60; $0.00 copayment for days 61-100 Maximum of 100 days/benefit period ?Outpatient:  $10 copay/visit ?Home Health:  100% coverage ?DME: 80% coverage, 20% co-insurance ?Providers: in network  ?SECONDARY:       Policy#:      Phone#:  ? ? ?Financial Counselor:      Phone#:  ? ?The ?Data Collection Information Summary? for patients in Inpatient Rehabilitation Facilities with attached ?Privacy Act [place] Records? was provided and verbally reviewed with: Patient ? ?Emergency Contact Information ?Contact Information   ? ? Name Relation Home Work Mobile  ? Mervin Hack Daughter [phone]    ? JAMES,ADAM Relative   [phone]  ? ?  ? ? ?Current Medical History  ?Patient Admitting Diagnosis: CVA ?History of Present Illness: [name] is a 66 y.o. male with medical history significant for CAD s/p DES to ostial RCA (12/28/2021), T2DM, HTN who presented to the ED 5/10/23for evaluation of progressive weakness in his extremities. ?Patient recently admitted 12/27/2021-12/29/2021 for NSTEMI.  He underwent cardiac catheterization on 12/28/2021 with placement of DES to distal RCA.  He was started on DAPT with aspirin and Brilinta.  Of note he was not started on statin at the time  due to elevated LFTs. Initial vitals showed BP 170/92, pulse 90, RR 22, temp 98.3 ?F, SPO2 97% on room air. Labs show CK 7291, CRP 1.4, ESR 20, troponin 28 > 29, AST 181, ALT 385, alk phos 66, total bilirubin 0.7, BUN 16, creatinine 0.46, serum glucose 274, sodium 137, potassium 4.0, bicarb 24. Urinalysis and urine myoglobin ordered and pending collection. ?2 view chest x-ray negative for focal consolidation, edema, effusion.  Chronic interstitial lung markings noted.  CT chest/abdomen/pelvis with contrast negative for evidence of metastatic disease.  Extensive coronary artery calcification noted.  Stable 6 mm right upper lobe pulmonary nodule noted.  Moderate nonobstructing left nephrolithiasis seen.Patient was given 1 L LR.  Neurology were consulted.  The hospitalist service was consulted to admit for further evaluation and management.PT and OT saw Pt. And recommended CIR to assist return to PLOF.  ?  ?He denies any recent chest pain, dyspnea, nausea, vomiting, abdominal pain. ?Complete NIHSS TOTAL: 4 ? ?Patient's medical [facility] has been reviewed by the rehabilitation admission coordinator and physician. ? ?Past Medical History  ?Past Medical History:  ?Diagnosis Date  ? Arthritis   ? Diabetes mellitus   ? fasting 120s  ? History of kidney stones   ? Hypertension   ? Peripheral neuropathy   ? left fifth finger, left  toe  ? Pneumonia   ? hx of  ? ? ?Has the patient had major surgery during 100 days prior to admission? Yes ? ?Family History   ?family history includes Alzheimer's disease in his mother; Heart attack in his father. ? ?Current Medications ? ?Current Facility-Administered Medications:  ?  amLODipine (NORVASC) tablet 10 mg, 10 mg, Oral, q morning, Zada Finders R, MD, 10 mg at 02/08/22 8768 ?  aspirin EC tablet 81 mg, 81 mg, Oral, Daily, Zada Finders R, MD, 81 mg at 02/08/22 1157 ?  carvedilol (COREG) tablet 12.5 mg, 12.5 mg, Oral, BID WC, Zada Finders R, MD, 12.5 mg at  02/08/22 2620 ?  cholecalciferol (VITAMIN D3) tablet 1,000 Units, 1,000 Units, Oral, Daily, Cherene Altes, MD, 1,000 Units at 02/08/22 [phone] ?  citalopram (CELEXA) tablet 20 mg, 20 mg, Oral, Daily, Cherene Altes, MD, 20 mg at 02/08/22 7416 ?  cyclobenzaprine (FLEXERIL) tablet 10 mg, 10 mg, Oral, QHS, Cherene Altes, MD, 10 mg at 02/07/22 2203 ?  enoxaparin (LOVENOX) injection 40 mg, 40 mg, Subcutaneous, Q24H, Zada Finders R, MD, 40 mg at 02/08/22 3845 ?  insulin aspart (novoLOG) injection 0-20 Units, 0-20 Units, Subcutaneous, TID WC, Cherene Altes, MD, 4 Units at 02/08/22 0810 ?  insulin aspart (novoLOG) injection 0-5 Units, 0-5 Units, Subcutaneous, QHS, McClung, Kimberlee Nearing, MD ?  insulin glargine-yfgn (SEMGLEE) injection 20 Units, 20 Units, Subcutaneous, BID, Cherene Altes, MD, 20 Units at 02/08/22 [phone] ?  lisinopril (ZESTRIL) tablet 40 mg, 40 mg, Oral, q morning, Zada Finders R, MD, 40 mg at 02/08/22 8032 ?  multivitamin with minerals tablet 1 tablet, 1 tablet, Oral, Daily, Cherene Altes, MD, 1 tablet at 02/08/22 1224 ?  ondansetron (ZOFRAN) tablet 4 mg, 4 mg, Oral, Q6H PRN **OR** ondansetron (ZOFRAN) injection 4 mg, 4 mg, Intravenous, Q6H PRN, Posey Pronto, Vishal R, MD ?  potassium chloride SA (KLOR-CON M) CR tablet 40 mEq, 40 mEq, Oral, Daily, Cherene Altes, MD, 40 mEq at 02/08/22 8250 ?  predniSONE (DELTASONE) tablet 80 mg, 80 mg, Oral, Q breakfast, Cherene Altes, MD, 80 mg at 02/08/22 0370 ?  senna-docusate (Senokot-S) tablet 1 tablet, 1 tablet, Oral, QHS PRN, Lenore Cordia, MD ?  ticagrelor (BRILINTA) tablet 90 mg, 90 mg, Oral, BID, Lenore Cordia, MD, 90 mg at 02/08/22 4888 ? ?Patients Current Diet:  ?Diet Order   ? ?       ?  Diet Carb Modified Fluid consistency: Thin; Room service appropriate? Yes  Diet effective now       ?  ? ?  ?  ? ?  ? ? ?Precautions / Restrictions ?Precautions ?Precautions: Fall ?Restrictions ?Weight Bearing Restrictions: No  ? ?Has the patient had  2 or more falls or a fall with injury in the past year? Yes ? ?Prior Activity Level ?Community (5-7x/wk): Pt. active in the community PTA ? ?Prior Functional Level ?Self Care: Did the patient need help bathing, dressing, using the toilet or eating? Independent ? ?Indoor Mobility: Did the patient need assistance with walking from room to room (with or without device)? Independent ? ?Stairs: Did the patient need assistance with internal or external stairs (with or without device)? Independent ? ?Functional Cognition: Did the patient need help planning regular tasks such as shopping or remembering to take medications? Independent ? ?Patient Information ?Are you of Hispanic, Latino/a,or Spanish origin?: A. No, not of Hispanic, Latino/a, or Spanish origin ?What is your race?: A. White ?Do  you need or want an interpreter to communicate with a doctor or health care staff?: 0. No ? ?Patient's Response To:  ?Health Literacy and Transportation ?Is the patient able to respond to health literacy and transportation needs?: Yes ?Health Literacy - How often do you need to have someone help you when you read instructions, pamphlets, or other written material from your doctor or pharmacy?: Sometimes ?In the past 12 months, has lack of transportation kept you from medical appointments or from getting medications?: No ?In the past 12 months, has lack of transportation kept you from meetings, work, or from getting things needed for daily living?: No ? ?Home Assistive Devices / Equipment ?Home Equipment: Cane - quad ? ?Prior Device Use: Indicate devices/aids used by the patient prior to current illness, exacerbation or injury? None of the above ? ?Current Functional Level ?Cognition ? Arousal/Alertness: Awake/alert ?Overall Cognitive Status: Within Functional Limits for tasks assessed ?Orientation Level: Oriented X4 ?Attention: Focused, Sustained, Selective ?Focused Attention: Appears intact ?Sustained Attention: Appears  intact ?Selective Attention: Appears intact ?Memory: Appears intact (Immediate: 5/5; delayed: 5/5) ?Awareness: Appears intact ?Problem Solving: Appears intact (Money: 3/3; time: 1/1) ?Executive Function: Sequencing

## 2022-02-08 NOTE — Plan of Care (Addendum)
Steroid taper recommendations for inflammatory myositis per UpToDate: ? ?" Ideally, normalization of enzymes and complete recovery of muscle strength should occur before glucocorticoids are tapered, although continuation of high-dose glucocorticoids for more than six weeks may cause steroid myopathy and decline in strength. (See 'Glucocorticoid-sparing agents' below.) ? ?There is no standard tapering regimen for the inflammatory myopathies, but regimens similar to the one outlined below are often used. Assuming that a patient begins prednisone treatment at 60 mg/day and remains on this dose for six weeks, the following taper will require a total of 26 weeks to reach a daily dose of 5 mg: ? ??The prednisone dose should be tapered by 10 mg each week until a dose of 40 mg/day is reached. ? ??After one week on 40 mg/day, the prednisone dose should be tapered by 5 mg each week until the patient reaches 20 mg/day. ? ??After one week on 20 mg/day, the prednisone dose should be tapered by 2.5 mg each week until the patient reaches 10 mg/day. ? ??After one week on 10 mg/day, the prednisone dose should be tapered by 1 mg every two weeks until the patient reaches 5 mg/day." ? ?Electronically signed: Dr. Kerney Elbe ? ?

## 2022-02-08 NOTE — Progress Notes (Addendum)
Physical Therapy Treatment ?Patient Details ?Name: [name] ?MRN: [id number] ?DOB: 1956/04/19 ?Today's Date: 02/08/2022 ? ? ?History of Present Illness Pt is a 66 y/o male admitted secondary to progressive UE and LE weakness. Workup pending, however, differential including myositis from statin use. MRI showed acute R pontine infarct. PMH includes DM, CAD, HTN, and, MI. ? ?  ?PT Comments  ? ? Patient reports he "lowered himself to the floor" this morning because his legs gave out (reports he was trying to stand up from EOB and reports that nursing was present. RN made aware and she did not receive any report of a fall). Reports he had a difficult time getting off the floor.  Noted quad strength worse today with flexed knees in gait and unsteadiness noted. Manual muscle test with 3+ quads on rt and 3/5 on left. Patient reports he did a lot of the exercises I taught him yesterday and today his muscles are sore and weak (despite education yesterday to not overdo the exercises). Instructed he must use RW when up and reiterated proper use/proximity in case his knees were to buckle. NT and RN made aware of fall and pt not to be up alone. Continue to feel AIR would be of benefit to this patient prior to return home. ?  ?Recommendations for follow up therapy are one component of a multi-disciplinary discharge planning process, led by the attending physician.  Recommendations may be updated based on patient status, additional functional criteria and insurance authorization. ? ?Follow Up Recommendations ? Acute inpatient rehab (3hours/day) ?  ?  ?Assistance Recommended at Discharge Intermittent Supervision/Assistance  ?Patient can return home with the following A little help with walking and/or transfers;A little help with bathing/dressing/bathroom;Assistance with cooking/housework;Help with stairs or ramp for entrance ?  ?Equipment Recommendations ? Rolling walker (2 wheels)  ?  ?Recommendations for Other Services   ? ? ?   ?Precautions / Restrictions Precautions ?Precautions: Fall ?Restrictions ?Weight Bearing Restrictions: No  ?  ? ?Mobility ? Bed Mobility ?  ?  ?  ?  ?  ?  ?  ?General bed mobility comments: in chair ?  ? ?Transfers ?Overall transfer level: Needs assistance ?Equipment used: None, Rolling walker (2 wheels) ?Transfers: Sit to/from Stand ?Sit to Stand: Min guard ?  ?  ?  ?  ?  ?General transfer comment: due to fall this a.m., minguard assist and instructed to use RW ?  ? ?Ambulation/Gait ?Ambulation/Gait assistance: Min guard, Min assist ?Gait Distance (Feet): 200 Feet ?Assistive device: Rolling walker (2 wheels) ?Gait Pattern/deviations: Step-through pattern, Decreased step length - left, Knee flexed in stance - left, Knee flexed in stance - right, Decreased stance time - left ?Gait velocity: decr ?  ?  ?General Gait Details: incr reliance on UEs via RW with incr unsteadiness in bil knees noted; pt with poor awareness to limit his distance despite education of risks of overdoing activity ? ? ?Stairs ?  ?  ?  ?  ?  ? ? ?Wheelchair Mobility ?  ? ?Modified Rankin (Stroke Patients Only) ?Modified Rankin (Stroke Patients Only) ?Pre-Morbid Rankin Score: No symptoms ?Modified Rankin: Moderately severe disability ? ? ?  ?Balance Overall balance assessment: Needs assistance ?Sitting-balance support: Feet supported ?Sitting balance-Leahy Scale: Good ?Sitting balance - Comments: Able to lean down, get shoes and socks and don socks and shoes independently. ?  ?Standing balance support: During functional activity, Bilateral upper extremity supported, Reliant on assistive device for balance ?Standing balance-Leahy Scale: Fair ?Standing balance  comment: needs UE support for dynamic standing activities ?  ?  ?  ?  ?  ?  ?  ?  ?  ?  ?  ?  ? ?  ?Cognition Arousal/Alertness: Awake/alert ?Behavior During Therapy: [facility] for tasks assessed/performed ?Overall Cognitive Status: Within Functional Limits for tasks assessed ?  ?  ?  ?  ?  ?  ?  ?   ?  ?  ?  ?  ?  ?  ?  ?  ?  ?  ?  ? ?  ?Exercises   ? ?  ?General Comments   ?  ?  ? ?Pertinent Vitals/Pain Pain Assessment ?Pain Assessment: 0-10 ?Faces Pain Scale: Hurts little more ?Pain Location: BLE ?Pain Descriptors / Indicators: Tiring, Discomfort, Sore ?Pain Intervention(s): Limited activity within patient's tolerance, Monitored during session  ? ? ?Home Living   ?  ?  ?  ?  ?  ?  ?  ?  ?  ?   ?  ?Prior Function    ?  ?  ?   ? ?PT Goals (current goals can now be found in the care plan section) Acute Rehab PT Goals ?Patient Stated Goal: to be independent ?Time For Goal Achievement: 02/17/22 ?Potential to Achieve Goals: Good ?Progress towards PT goals: Not progressing toward goals - comment (legs weaker, less steady today) ? ?  ?Frequency ? ? ? Min 3X/week ? ? ? ?  ?PT Plan Current plan remains appropriate  ? ? ?Co-evaluation   ?  ?  ?  ?  ? ?  ?AM-PAC PT "6 Clicks" Mobility   ?Outcome Measure ? Help needed turning from your back to your side while in a flat bed without using bedrails?: None ?Help needed moving from lying on your back to sitting on the side of a flat bed without using bedrails?: None ?Help needed moving to and from a bed to a chair (including a wheelchair)?: A Little ?Help needed standing up from a chair using your arms (e.g., wheelchair or bedside chair)?: A Little ?Help needed to walk in hospital room?: A Little ?Help needed climbing 3-5 steps with a railing? : A Lot ?6 Click Score: 19 ? ?  ?End of Session Equipment Utilized During Treatment: Gait belt ?Activity Tolerance: Patient limited by fatigue ?Patient left: in chair;with call bell/phone within reach ?Nurse Communication: Mobility status;Other (comment) (pt should not be up alone) ?PT Visit Diagnosis: Unsteadiness on feet (R26.81);Muscle weakness (generalized) (M62.81);Other abnormalities of gait and mobility (R26.89) ?  ? ? ?Time: 3833-3832 ?PT Time Calculation (min) (ACUTE ONLY): 23 min ? ?Charges:  $Gait Training: 8-22 mins           ?          ? ? ?Arby Barrette, PT ?Acute Rehabilitation Services  ?Pager [phone] ?Office [phone] ? ? ? ?Jeanie Cooks Ethelle Ola ?02/08/2022, 9:47 AM ? ?

## 2022-02-08 NOTE — Progress Notes (Signed)
Pt alert and oriented x4 in no acute distress upon transfer to 756M. Pt transferred via wheelchair by NT. Pt's belongings taken with pt to 756M Rm 8.  ?

## 2022-02-08 NOTE — H&P (Shared)
? ? ?Physical Medicine and Rehabilitation Admission H&P ? ?  ?Chief Complaint  ?Patient presents with  ? Functional deficits  ? ? ?HPI:  [name]. [name] is a 66 year old male with history of T2DM, HTN,  intolerance of statin due to abnormal LFTs (off for the past year), CAD s/p DES to RCA  12/28/21 (addition of Atorvastatin 20 mg, ASA/Brilinta), reports of 30 lbs unintentional wt loss over a year and  progressive BUE and BLE weakness over past 4-5 months. Since starting statin again, he started having near falls, significant worsening of weakness and presented to ED for work up on 02/03/22. MRI brain done revealing acute right pontine infarct felt to be incidental. CTA head/neck was negative for LVO but showed extensive atherosclerosis,  70% R-ICA stenosis, 50% L-ICA stenosis, 50% R-VA and 40% L-VA stenosis. MRI spine showed cervical, thoracic and lumbar spine negative for cord compression or inflammatory changes. CT abdomen/pelvis was negative for metastatic disease, intra-abdominal or intrathoracic pathology with moderate non-obst left nephrolithiasis with moderate cortical atrophy. 2D echo/bubble study was negative for IA shunt.  ? ?Neurology consulted and felt that symptoms concerning for myopathy--CK elevated at 7291.  He was treated with 3 day course of solumedrol as well as IVF for hydration. General surgery consulted for muscle biopsy but would need to be off Brilinta for procedure. Cardiology recommended waiting at least 3 months due to need for DAPT secondary to recent drug eluting stent and plans biopsy in 3-4 months.  HMG-COA reductase antibodies ordered as well as recommendations for muscle biopsy. 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  Abnormal LFTs and CK trending down  (1195 today).  Has completed 3 days course of solumedrol and strength felt to have plateaued-->transitioned to prednisone 80 mg today. Patient continues to be limited by weakness (legs gave out this am with nursing), increased unsteadiness noted today (felt to be due to overdoing exercises yesterday) ? ? ?Review of Systems  ?Constitutional:  Negative for chills and fever.  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 Negative for tinnitus.   ?Eyes:  Negative for blurred vision and double vision.  ?Respiratory:  Negative for cough and shortness of breath.   ?Cardiovascular:  Negative for chest pain, palpitations and leg swelling.  ?Gastrointestinal:  Negative for constipation, heartburn and nausea.  ?Genitourinary:  Negative for dysuria.  ?Musculoskeletal:  Positive for back pain (low back) and joint pain (right knee).  ?     Right rotator cuff tear.   ?Skin:  Negative for rash.  ?Neurological:  Positive for weakness. Negative for dizziness and headaches.  ?Psychiatric/Behavioral:  Negative for memory loss. The patient is not nervous/anxious.   ? ? ?Past Medical History:  ?Diagnosis Date  ? Arthritis   ? Diabetes mellitus   ? fasting 120s  ? History of kidney stones   ? Hypertension   ? Peripheral neuropathy   ? left fifth finger, left toe  ? Pneumonia   ? hx of  ? ? ?Past Surgical History:  ?Procedure Laterality Date  ? APPENDECTOMY    ? CORONARY STENT INTERVENTION N/A 12/28/2021  ? Procedure: CORONARY STENT INTERVENTION;  Surgeon: Jettie Booze, MD;  Location: [place] [facility];  Service: Cardiovascular;  Laterality: N/A;  ? CYSTOSCOPY W/ URETERAL STENT PLACEMENT Bilateral 03/17/2014  ? Procedure: CYSTOSCOPY WITH RIGHT STENT PULL  ;  Surgeon: Alexis Frock, MD;  Location: [facility];  Service: Urology;  Laterality: Bilateral;  ? CYSTOSCOPY WITH RETROGRADE PYELOGRAM, URETEROSCOPY AND STENT PLACEMENT Bilateral 02/01/2014  ? Procedure: CYSTOSCOPY WITH BILATERAL  RETROGRADE CYSTOSCOPY, BILATERAL RETROGRADE, PYELOGRAM, RIGHT URETEROSCOPY, STONE BASKETRY,  AND BILATERAL STENT PLACEMENT;  Surgeon: Alexis Frock, MD;  Location: [facility];  Service: Urology;  Laterality: Bilateral;  ? CYSTOSCOPY WITH URETEROSCOPY AND STENT PLACEMENT Left 02/12/2021  ? Procedure: CYSTOSCOPY WITH LEFT URETEROSCOPY, BALLOON DILITATION, HOLMIUM LASER LITHOTRIPSY AND STENT PLACEMENT;  Surgeon: Lucas Mallow, MD;  Location: [facility];  Service: Urology;  Laterality: Left;  ? HOLMIUM LASER APPLICATION Left 7/98/9211  ? Procedure: HOLMIUM LASER APPLICATION;  Surgeon: Alexis Frock, MD;  Location: [facility];  Service: Urology;  Laterality: Left;  ? INTRAVASCULAR ULTRASOUND/IVUS N/A 12/28/2021  ? Procedure: Intravascular Ultrasound/IVUS;  Surgeon: Jettie Booze, MD;  Location: [place] [facility];  Service: Cardiovascular;  Laterality: N/A;  ? LEFT HEART CATH AND CORONARY ANGIOGRAPHY N/A 12/28/2021  ? Procedure: LEFT HEART CATH AND CORONARY ANGIOGRAPHY;  Surgeon: Jettie Booze, MD;  Location: [place] [facility];  Service: Cardiovascular;  Laterality: N/A;  ? LITHOTRIPSY    ? NEPHROLITHOTOMY Left 03/17/2014  ? Procedure: NEPHROLITHOTOMY PERCUTANEOUS with access;  Surgeon: Alexis Frock, MD;  Location: [facility];  Service: Urology;  Laterality: Left;  ? NEPHROLITHOTOMY Left 03/19/2014  ? Procedure: 2ND STAGE NEPHROLITHOTOMY PERCUTANEOUS left ureteroscopy with lasr stone treatment and stent placement, with access;  Surgeon: Alexis Frock, MD;  Location: [facility];  Service: Urology;  Laterality: Left;  ? TOTAL KNEE ARTHROPLASTY Right 09/02/2014  ? Procedure: RIGHT TOTAL KNEE ARTHROPLASTY;  Surgeon: Meredith Pel, MD;  Location: [place];  Service: Orthopedics;  Laterality: Right;  ? uretral stent    ? ? ?Family History  ?Problem Relation Age of Onset  ? Alzheimer's disease Mother   ? Heart attack Father   ? ? ?Social History:  Widowed. 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 He reports that he has never smoked. He has never used smokeless tobacco. He reports current alcohol use of about 2.0 standard drinks per week. He reports that he does not use drugs. ? ? ?Allergies  ?Allergen Reactions  ? Atorvastatin Calcium Other (See Comments)  ?  Statin induced myopathy and transaminitis  ? ? ?Medications Prior to Admission  ?Medication Sig Dispense Refill  ? amLODipine (NORVASC) 10 MG tablet Take 10 mg by mouth every morning.    ? aspirin 81 MG EC tablet Take 1 tablet (81 mg total) by mouth daily. Swallow whole. 90 tablet 3  ? carvedilol (COREG) 12.5 MG tablet Take 1 tablet (12.5 mg total) by mouth 2 (two) times daily with a meal. 60 tablet 11  ? cholecalciferol (VITAMIN D) 1000 UNITS tablet Take 1,000 Units by mouth daily.    ? citalopram (CELEXA) 20 MG tablet Take 20 mg by mouth daily.    ? Cyanocobalamin (VITAMIN B-12) 2500 MCG SUBL Place 1 tablet under the tongue daily.    ? cyclobenzaprine (FLEXERIL) 10 MG tablet TAKE 1 TABLET BY MOUTH TWICE DAILY (Patient taking differently: Take 10 mg by mouth at bedtime.) 30 tablet 0  ? diclofenac Sodium (VOLTAREN) 1 % GEL Apply 2 g topically 4 (four) times daily. (  Patient taking differently: Apply 2 g topically 4 (four) times daily as needed (knee pain).) 100 g 0  ? hydrochlorothiazide (HYDRODIURIL) 25 MG tablet Take 12.5 mg by mouth daily.    ? insulin glargine (LANTUS) 100 UNIT/ML injection Inject 30 Units into the skin at bedtime.     ? lisinopril (PRINIVIL,ZESTRIL) 40 MG tablet Take 40 mg by mouth every morning.     ? metFORMIN (GLUCOPHAGE) 500 MG tablet Take 1,000 mg by mouth 2 (two) times daily with a meal.     ? Multiple Vitamins-Minerals (MULTIVITAMINS THER. W/MINERALS) TABS Take 1 tablet by mouth daily.    ? nitroGLYCERIN (NITROSTAT) 0.4 MG SL tablet Place 1 tablet (0.4 mg total) under the tongue every 5 (five) minutes x 3 doses as needed for chest pain. 25 tablet 12  ? Omega-3  Fatty Acids (FISH OIL) 1200 MG CAPS Take 1,200 mg by mouth daily.    ? pioglitazone (ACTOS) 30 MG tablet Take 30 mg by mouth daily after lunch.    ? ticagrelor (BRILINTA) 90 MG TABS tablet Take 1 tablet (90 mg

## 2022-02-08 NOTE — Progress Notes (Addendum)
PMR Admission Coordinator Pre-Admission Assessment ?  ?Patient: [name] is an 66 y.o., male ?MRN: [id number] ?DOB: [birth year] ?Height:   ?Weight:   ?  ?Insurance Information ?HMO:     PPO: Yes     PCP:      IPA:      80/20:      OTHER:  ?PRIMARY: Blue Medicare       Policy#: [id number]      Subscriber: pt ?CM Name: Larene Beach      Phone#: [phone]     Fax#: [phone] ? I received approval for CIR admit from [facility] with [facility] on 02/07/22. Pt. Authorized for admit 5/16-5/23 with update due 5/23 ?Pre-Cert#: [id number] Employer:  ?Benefits:  Phone #:      Name:  ?Irene Shipper Date: 12/25/2021 - still active ?Deductible: does not have one ?OOP Max: $5,650 ($157.69 met) ?CIR: $335/day co-pay for days 1-6 ?SNF: $0.00 Copayment per day for days 1-20; $196 Copayment per day for days 21-60; $0.00 copayment for days 61-100 Maximum of 100 days/benefit period ?Outpatient:  $10 copay/visit ?Home Health:  100% coverage ?DME: 80% coverage, 20% co-insurance ?Providers: in network  ?SECONDARY:       Policy#:      Phone#:  ?  ?  ?Financial Counselor:      Phone#:  ?  ?The ?Data Collection Information Summary? for patients in Inpatient Rehabilitation Facilities with attached ?Privacy Act [place] Records? was provided and verbally reviewed with: Patient ?  ?Emergency Contact Information ?Contact Information   ?  ?  Name Relation Home Work Mobile  ?  Mervin Hack Daughter [phone]      ?  JAMES,ADAM Relative     [phone]  ?  ?   ?  ?  ?Current Medical History  ?Patient Admitting Diagnosis: CVA ?History of Present Illness: [name] is a 66 y.o. male with medical history significant for CAD s/p DES to ostial RCA (12/28/2021), T2DM, HTN who presented to the ED 5/10/23for evaluation of progressive weakness in his extremities. ?Patient recently admitted 12/27/2021-12/29/2021 for NSTEMI.  He underwent cardiac catheterization on 12/28/2021 with placement of DES to distal RCA.  He was started on DAPT with aspirin  and Brilinta.  Of note he was not started on statin at the time due to elevated LFTs. Initial vitals showed BP 170/92, pulse 90, RR 22, temp 98.3 ?F, SPO2 97% on room air. Labs show CK 7291, CRP 1.4, ESR 20, troponin 28 > 29, AST 181, ALT 385, alk phos 66, total bilirubin 0.7, BUN 16, creatinine 0.46, serum glucose 274, sodium 137, potassium 4.0, bicarb 24. Urinalysis and urine myoglobin ordered and pending collection. ?2 view chest x-ray negative for focal consolidation, edema, effusion.  Chronic interstitial lung markings noted.  CT chest/abdomen/pelvis with contrast negative for evidence of metastatic disease.  Extensive coronary artery calcification noted.  Stable 6 mm right upper lobe pulmonary nodule noted.  Moderate nonobstructing left nephrolithiasis seen.Patient was given 1 L LR.  Neurology were consulted.  The hospitalist service was consulted to admit for further evaluation and management.PT and OT saw Pt. And recommended CIR to assist return to PLOF.  ?  ?He denies any recent chest pain, dyspnea, nausea, vomiting, abdominal pain. ?Complete NIHSS TOTAL: 4 ?  ?Patient's medical record from [facility] has been reviewed by the rehabilitation admission coordinator and physician. ?  ?Past Medical History  ?    ?Past Medical History:  ?Diagnosis Date  ? Arthritis    ? Diabetes  mellitus    ?  fasting 120s  ? History of kidney stones    ? Hypertension    ? Peripheral neuropathy    ?  left fifth finger, left toe  ? Pneumonia    ?  hx of  ?  ?  ?Has the patient had major surgery during 100 days prior to admission? Yes ?  ?Family History   ?family history includes Alzheimer's disease in his mother; Heart attack in his father. ?  ?Current Medications ?  ?Current Facility-Administered Medications:  ?  amLODipine (NORVASC) tablet 10 mg, 10 mg, Oral, q morning, Zada Finders R, MD, 10 mg at 02/08/22 0160 ?  aspirin EC tablet 81 mg, 81 mg, Oral, Daily, Zada Finders R, MD, 81 mg at 02/08/22 1093 ?   carvedilol (COREG) tablet 12.5 mg, 12.5 mg, Oral, BID WC, Zada Finders R, MD, 12.5 mg at 02/08/22 2355 ?  cholecalciferol (VITAMIN D3) tablet 1,000 Units, 1,000 Units, Oral, Daily, Cherene Altes, MD, 1,000 Units at 02/08/22 [phone] ?  citalopram (CELEXA) tablet 20 mg, 20 mg, Oral, Daily, Cherene Altes, MD, 20 mg at 02/08/22 0254 ?  cyclobenzaprine (FLEXERIL) tablet 10 mg, 10 mg, Oral, QHS, Cherene Altes, MD, 10 mg at 02/07/22 2203 ?  enoxaparin (LOVENOX) injection 40 mg, 40 mg, Subcutaneous, Q24H, Zada Finders R, MD, 40 mg at 02/08/22 2706 ?  insulin aspart (novoLOG) injection 0-20 Units, 0-20 Units, Subcutaneous, TID WC, Cherene Altes, MD, 4 Units at 02/08/22 0810 ?  insulin aspart (novoLOG) injection 0-5 Units, 0-5 Units, Subcutaneous, QHS, McClung, Kimberlee Nearing, MD ?  insulin glargine-yfgn (SEMGLEE) injection 20 Units, 20 Units, Subcutaneous, BID, Cherene Altes, MD, 20 Units at 02/08/22 [phone] ?  lisinopril (ZESTRIL) tablet 40 mg, 40 mg, Oral, q morning, Zada Finders R, MD, 40 mg at 02/08/22 2831 ?  multivitamin with minerals tablet 1 tablet, 1 tablet, Oral, Daily, Cherene Altes, MD, 1 tablet at 02/08/22 5176 ?  ondansetron (ZOFRAN) tablet 4 mg, 4 mg, Oral, Q6H PRN **OR** ondansetron (ZOFRAN) injection 4 mg, 4 mg, Intravenous, Q6H PRN, Posey Pronto, Vishal R, MD ?  potassium chloride SA (KLOR-CON M) CR tablet 40 mEq, 40 mEq, Oral, Daily, Cherene Altes, MD, 40 mEq at 02/08/22 1607 ?  predniSONE (DELTASONE) tablet 80 mg, 80 mg, Oral, Q breakfast, Cherene Altes, MD, 80 mg at 02/08/22 3710 ?  senna-docusate (Senokot-S) tablet 1 tablet, 1 tablet, Oral, QHS PRN, Lenore Cordia, MD ?  ticagrelor (BRILINTA) tablet 90 mg, 90 mg, Oral, BID, Lenore Cordia, MD, 90 mg at 02/08/22 6269 ?  ?Patients Current Diet:  ?Diet Order   ?  ?         ?    Diet Carb Modified Fluid consistency: Thin; Room service appropriate? Yes  Diet effective now       ?  ?  ?   ?  ?  ?   ?  ?  ?Precautions /  Restrictions ?Precautions ?Precautions: Fall ?Restrictions ?Weight Bearing Restrictions: No  ?  ?Has the patient had 2 or more falls or a fall with injury in the past year? Yes ?  ?Prior Activity Level ?Community (5-7x/wk): Pt. active in the community PTA ?  ?Prior Functional Level ?Self Care: Did the patient need help bathing, dressing, using the toilet or eating? Independent ?  ?Indoor Mobility: Did the patient need assistance with walking from room to room (with or without device)? Independent ?  ?Stairs: Did the patient  need assistance with internal or external stairs (with or without device)? Independent ?  ?Functional Cognition: Did the patient need help planning regular tasks such as shopping or remembering to take medications? Independent ?  ?Patient Information ?Are you of Hispanic, Latino/a,or Spanish origin?: A. No, not of Hispanic, Latino/a, or Spanish origin ?What is your race?: A. White ?Do you need or want an interpreter to communicate with a doctor or health care staff?: 0. No ?  ?Patient's Response To:  ?Health Literacy and Transportation ?Is the patient able to respond to health literacy and transportation needs?: Yes ?Health Literacy - How often do you need to have someone help you when you read instructions, pamphlets, or other written material from your doctor or pharmacy?: Sometimes ?In the past 12 months, has lack of transportation kept you from medical appointments or from getting medications?: No ?In the past 12 months, has lack of transportation kept you from meetings, work, or from getting things needed for daily living?: No ?  ?Home Assistive Devices / Equipment ?Home Equipment: Cane - quad ?  ?Prior Device Use: Indicate devices/aids used by the patient prior to current illness, exacerbation or injury? None of the above ?  ?Current Functional Level ?Cognition ?  Arousal/Alertness: Awake/alert ?Overall Cognitive Status: Within Functional Limits for tasks assessed ?Orientation Level:  Oriented X4 ?Attention: Focused, Sustained, Selective ?Focused Attention: Appears intact ?Sustained Attention: Appears intact ?Selective Attention: Appears intact ?Memory: Appears intact (Immediate: 5/5; delayed: 5/5) ?Aware

## 2022-02-08 NOTE — H&P (Addendum)
?  ?Physical Medicine and Rehabilitation Admission H&P ?  ?  ?   ?Chief Complaint  ?Patient presents with  ? Functional deficits  ?  ?  ?HPI:  [name]. Rote is a 66 year old male with history of T2DM, HTN,  intolerance of statin due to abnormal LFTs (off for the past year), CAD s/p DES to RCA  12/28/21 (addition of Atorvastatin 20 mg, ASA/Brilinta), reports of 30 lbs unintentional wt loss over a year and  progressive BUE and BLE weakness over past 4-5 months. Since starting statin again, he started having near falls, significant worsening of weakness and presented to ED for work up on 02/03/22. MRI brain done revealing acute right pontine infarct felt to be incidental. CTA head/neck was negative for LVO but showed extensive atherosclerosis,  70% R-ICA stenosis, 50% L-ICA stenosis, 50% R-VA and 40% L-VA stenosis. MRI spine showed cervical, thoracic and lumbar spine negative for cord compression or inflammatory changes. CT abdomen/pelvis was negative for metastatic disease, intra-abdominal or intrathoracic pathology with moderate non-obst left nephrolithiasis with moderate cortical atrophy. 2D echo/bubble study was negative for IA shunt.  ?  ?Neurology consulted and felt that symptoms concerning for myopathy--CK elevated at 7291.  He was treated with 3 day course of solumedrol as well as IVF for hydration. General surgery consulted for muscle biopsy but would need to be off Brilinta for procedure. Cardiology recommended waiting at least 3 months due to need for DAPT secondary to recent drug eluting stent and plans biopsy in 3-4 months.  HMG-COA reductase antibodies ordered as well as recommendations for muscle biopsy. 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 Also rate of improvement since steroids initiated also favors inflammatory myopathy. Small silent  brainstem stroke felt to be due to small vessel disease.  Abnormal LFTs and CK trending down (1195 today).  Has completed 3 days course of solumedrol and strength felt to have plateaued-->transitioned to prednisone 80 mg today. Patient continues to be limited by weakness (legs gave out this am with nursing), increased unsteadiness noted today (felt to be due to overdoing exercises yesterday) ?  ?Denies any significant pain at this time. He does feel reports he feels very fatigued overall. Last BM was about 2 days ago and he does not feel constipated.  ?  ?Review of Systems  ?Constitutional:  Negative for chills and fever.  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 Negative for tinnitus.   ?Eyes:  Negative for blurred vision and double vision.  ?Respiratory:  Negative for cough and shortness of breath.   ?Cardiovascular:  Negative for chest pain, palpitations and leg swelling.  ?Gastrointestinal:  Negative for constipation, heartburn and nausea.  ?Genitourinary:  Negative for dysuria.  ?Musculoskeletal:  Positive for back pain (low back) and joint pain (right knee).  ?     Right rotator cuff tear.   ?Skin:  Negative for rash.  ?Neurological:  Positive for weakness. Negative for dizziness and headaches.  ?Psychiatric/Behavioral:  Negative for memory loss. The patient is not nervous/anxious.   ?  ?  ?    ?Past Medical History:  ?Diagnosis Date  ? Arthritis    ? Diabetes mellitus    ?  fasting 120s  ? History of kidney stones    ? Hypertension    ? Peripheral  neuropathy    ?  left fifth finger, left toe  ? Pneumonia    ?  hx of  ?  ?  ?     ?Past Surgical History:  ?Procedure Laterality Date  ? APPENDECTOMY      ? CORONARY STENT INTERVENTION N/A 12/28/2021  ?  Procedure: CORONARY STENT INTERVENTION;  Surgeon: Jettie Booze, MD;  Location: [place] [facility];  Service: Cardiovascular;  Laterality: N/A;  ? CYSTOSCOPY W/ URETERAL STENT PLACEMENT Bilateral 03/17/2014  ?   Procedure: CYSTOSCOPY WITH RIGHT STENT PULL  ;  Surgeon: Alexis Frock, MD;  Location: [facility];  Service: Urology;  Laterality: Bilateral;  ? CYSTOSCOPY WITH RETROGRADE PYELOGRAM, URETEROSCOPY AND STENT PLACEMENT Bilateral 02/01/2014  ?  Procedure: CYSTOSCOPY WITH BILATERAL RETROGRADE CYSTOSCOPY, BILATERAL RETROGRADE, PYELOGRAM, RIGHT URETEROSCOPY, STONE BASKETRY,  AND BILATERAL STENT PLACEMENT;  Surgeon: Alexis Frock, MD;  Location: [facility];  Service: Urology;  Laterality: Bilateral;  ? CYSTOSCOPY WITH URETEROSCOPY AND STENT PLACEMENT Left 02/12/2021  ?  Procedure: CYSTOSCOPY WITH LEFT URETEROSCOPY, BALLOON DILITATION, HOLMIUM LASER LITHOTRIPSY AND STENT PLACEMENT;  Surgeon: Lucas Mallow, MD;  Location: [facility];  Service: Urology;  Laterality: Left;  ? HOLMIUM LASER APPLICATION Left 3/84/6659  ?  Procedure: HOLMIUM LASER APPLICATION;  Surgeon: Alexis Frock, MD;  Location: [facility];  Service: Urology;  Laterality: Left;  ? INTRAVASCULAR ULTRASOUND/IVUS N/A 12/28/2021  ?  Procedure: Intravascular Ultrasound/IVUS;  Surgeon: Jettie Booze, MD;  Location: [place] [facility];  Service: Cardiovascular;  Laterality: N/A;  ? LEFT HEART CATH AND CORONARY ANGIOGRAPHY N/A 12/28/2021  ?  Procedure: LEFT HEART CATH AND CORONARY ANGIOGRAPHY;  Surgeon: Jettie Booze, MD;  Location: [place] [facility];  Service: Cardiovascular;  Laterality: N/A;  ? LITHOTRIPSY      ? NEPHROLITHOTOMY Left 03/17/2014  ?  Procedure: NEPHROLITHOTOMY PERCUTANEOUS with access;  Surgeon: Alexis Frock, MD;  Location: [facility];  Service: Urology;  Laterality: Left;  ? NEPHROLITHOTOMY Left 03/19/2014  ?  Procedure: 2ND STAGE NEPHROLITHOTOMY PERCUTANEOUS left ureteroscopy with lasr stone treatment and stent placement, with access;  Surgeon: Alexis Frock, MD;  Location: [facility];  Service: Urology;  Laterality: Left;  ? TOTAL KNEE ARTHROPLASTY Right 09/02/2014  ?  Procedure: RIGHT TOTAL KNEE ARTHROPLASTY;  Surgeon: Meredith Pel, MD;  Location:  [place];  Service: Orthopedics;  Laterality: Right;  ? uretral stent      ?  ?  ?     ?Family History  ?Problem Relation Age of Onset  ? Alzheimer's disease Mother    ? Heart attack Father    ?  ?  ?Social History:  Widowed. 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 He reports that he has never smoked. He has never used smokeless tobacco. He reports current alcohol use of about 2.0 standard drinks per week. He reports that he does not use drugs. ?  ?  ?     ?Allergies  ?Allergen Reactions  ? Atorvastatin Calcium Other (See Comments)  ?    Statin induced myopathy and transaminitis  ?  ?  ?      ?Medications Prior to Admission  ?Medication Sig Dispense Refill  ? amLODipine (NORVASC) 10 MG tablet Take 10 mg by mouth every morning.      ? aspirin 81 MG EC tablet Take 1 tablet (81 mg total) by mouth daily. Swallow whole. [place]  tablet 3  ? carvedilol (COREG) 12.5 MG tablet Take 1 tablet (12.5 mg total) by mouth 2 (two) times daily with a meal. 60 tablet 11  ? cholecalciferol (VITAMIN D) 1000 UNITS tablet Take 1,000 Units by mouth daily.      ? citalopram (CELEXA) 20 MG tablet Take 20 mg by mouth daily.      ? Cyanocobalamin (VITAMIN B-12) 2500 MCG SUBL Place 1 tablet under the tongue daily.      ? cyclobenzaprine (FLEXERIL) 10 MG tablet TAKE 1 TABLET BY MOUTH TWICE DAILY (Patient taking differently: Take 10 mg by mouth at bedtime.) 30 tablet 0  ? diclofenac Sodium (VOLTAREN) 1 % GEL Apply 2 g topically 4 (four) times daily. (Patient taking differently: Apply 2 g topically 4 (four) times daily as needed (knee pain).) 100 g 0  ? hydrochlorothiazide (HYDRODIURIL) 25 MG tablet Take 12.5 mg by mouth daily.      ? insulin glargine (LANTUS) 100 UNIT/ML injection Inject 30 Units into the skin at bedtime.       ? lisinopril (PRINIVIL,ZESTRIL) 40 MG tablet Take 40 mg by mouth every morning.       ? metFORMIN (GLUCOPHAGE) 500 MG tablet Take  1,000 mg by mouth 2 (two) times daily with a meal.       ? Multiple Vitamins-Minerals (MULTIVITAMINS THER. W/MINERALS) TABS Take 1 tablet by mouth daily.      ? nitroGLYCERIN (NITROSTAT) 0.4 MG SL t

## 2022-02-08 NOTE — Progress Notes (Signed)
Patient arrived from [facility], a ?Assigned to [id number]. Appears alert and denies pain at this time. ?

## 2022-02-08 NOTE — Progress Notes (Addendum)
Inpatient Diabetes Program Recommendations ? ?AACE/ADA: New Consensus Statement on Inpatient Glycemic Control (2015) ? ?Target Ranges:  Prepandial:   less than 140 mg/dL ?     Peak postprandial:   less than 180 mg/dL (1-2 hours) ?     Critically ill patients:  140 - 180 mg/dL  ? ?Lab Results  ?Component Value Date  ? GLUCAP 200 (H) 02/08/2022  ? HGBA1C 7.4 (H) 02/03/2022  ? ? ?Review of Glycemic Control ? Latest Reference Range & Units 02/07/22 08:10 02/07/22 11:53 02/07/22 16:46 02/07/22 21:37 02/08/22 07:44 02/08/22 11:56  ?Glucose-Capillary 70 - 99 mg/dL 183 (H) ? ?Novolog 4 units ? ?Semglee 20 units 207 (H) ? ?Novolog 7 units 302 (H) ? ?Novolog 15 units 315 (H) 196 (H) ? ?Novolog 4 units ? ?Semglee 20 units 200 (H)  ? ?Diabetes history: DM 2 ?Outpatient Diabetes medications: Lantus 30 units qhs, Metformin 1000 mg bid, Actos 30 mg Daily ?Current orders for Inpatient glycemic control:  ?Semglee 20 units bid ?Novolog 0-20 units tid + hs ? ?PO Prednisone 80 mg Daily ? ?Inpatient Diabetes Program Recommendations:   ? ?While on steroids consider the following: ?-  Novolog 5 units tid meal coverage if eating >50% of meals ? ? ?Thanks, ? ?Tama Headings RN, MSN, BC-ADM ?Inpatient Diabetes Coordinator ?Team Pager [phone] (8a-5p) ?

## 2022-02-08 NOTE — Discharge Summary (Addendum)
?DISCHARGE SUMMARY ? ?[name] ? ?MR#: [id number] ? ?DOB:1956/07/20  ?Date of Admission: 02/02/2022 ?Date of Discharge: 02/08/2022 ? ?Attending Physician:Winton Offord Hennie Duos, MD ? ?Patient's YCX:KGYJEH, Olin Hauser, MD ? ?Consults: Neurology ? ?Disposition: Discharged to CIR ? ?Follow-up Appts: ? ? ?Tests Needing Follow-up: ?-Monitor CBG closely on CIR with high-dose steroid being administered ?-Neurology to advise on specifics of ongoing oral steroid dosing ?-We will need arrangement of outpatient muscle biopsy in approximately 2 months, which can be facilitated through his neurologist or PCP -will require Lovenox bridge and 5 days of abstinence from aspirin and Brilinta ? 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 ?Initial presentation: ?66yo with a history of CAD status post DES to ostial RCA 12/28/2021, DM2, and HTN who presented to the ED with progressive weakness in both lower extremities and upper extremities.  He had recently been discharged from the hospital 12/29/2021 after suffering an NSTEMI at which time a DES was placed to the ostial RCA.  He was not started on a statin at that time due to elevated LFTs.  In subsequent follow-up however, atorvastatin 20 mg daily was initiated, and the patient has been on this for approximately 5 weeks.  During this time, he has developed progressive weakness of the proximal muscles of both legs and arms to the point that he requires assistance from family to mobilize, and also reports an unintentional 30 pound weight loss.  In the ER CK was found to be 7291.  LFTs were also elevated. ? ?Hospital Course: ? 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 Leonel Ramsay ?- received last dose of high-dose Solu-Medrol 5/15 - to continue very slow taper of oral prednisone, starting at ~'1MG'$ /KG/DAY under direction of Neurology  ?- eventual muscle bx can be arranged as outpt once pt is > 3 months out from his PTCA/DES - this can be accomplished via Neurology or his PCP ?  ?Acute right pontine CVA ?-MRI brain noted acute infarct R pons ?-CTa head/neck notes extensive atherosclerosis w/ at least 70% R ICA stenosis and 50% proximal L ICA stenosis as well as B vertebral origin stenosis of 50% on R and 40% on L -carotid Dopplers however suggest much less severe disease ?-Continue ASA + Brilinta ?-A1c 7.4  ?-LDL 191 (12/28/21) and 98 now (due to statin) ?-Neuro feels this is an incidental finding and not related to this presentation -no further investigation carried out during this hospital stay ?  ?Cervical spine neuroforaminal stenosis ?Noted on MRI cervical spine - MRI of thoracic and lumbar spine w/o acute findings that fit the clinical situation, but confirmed multiple areas of spinal disease with facet impingement - will need outpatient clinical monitoring ?  ?CAD s/p DES to ostial RCA 12/28/2021 ?-Continue aspirin and Brilinta -not safe to hold Brilinta presently due to recent deployment of DES -see discussion above ?-Continue Coreg ?-Asymptomatic from this regard during his hospital stay ?  ?Elevated LFTs ?CT A/P without hepatobiliary abnormality -history suggest this was likely the  reason his initial trial of statin was discontinued -LFTs steadily improving in absence of statin -will need to investigate other options for treatment in the outpatient setting, with possible referral to lipid specialist ?   ?Mild hypokalemia ?Likely simply due to poor intake -corrected with supplementation -magnesium is normal ?  ?Type 2 diabetes mellitus  ?A1c recently 7.4 -CBG reasonably controlled at present -anticipate hyperglycemia with high-dose steroids, but thus far able to be reasonably controlled with SSI ?  ?Hyperlipidemia ?Holding statin due to above -see discussion above ?  ?6 mm right upper lobe pulmonary nodule ?Noted on scan during this admission and also present on prior exams with radiologist stating stability over time allows [place] to safely consider this to be a benign finding ?  ?Allergies  ?Allergen Reactions  ? Atorvastatin Calcium Other (See Comments)  ?  Statin induced myopathy and transaminitis  ? ?No current facility-administered medications for this encounter. ?No current outpatient medications on file. ? ?Facility-Administered Medications Ordered in Other Encounters:  ?  acetaminophen (TYLENOL) tablet 325-650 mg, 325-650 mg, Oral, Q4H PRN, Love, Pamela S, PA-C ?  alum & mag hydroxide-simeth (MAALOX/MYLANTA) 200-200-20 MG/5ML suspension 30 mL, 30 mL, Oral, Q4H PRN, Love, Ivan Anchors, PA-C ?  [START ON 02/09/2022] amLODipine (NORVASC) tablet 10 mg, 10 mg, Oral, Q breakfast, Love, Pamela S, PA-C ?  [START ON 02/09/2022] aspirin EC tablet 81 mg, 81 mg, Oral, Daily, Love, Pamela S, PA-C ?  bisacodyl (DULCOLAX) suppository 10 mg, 10 mg, Rectal, Daily PRN, Love, Pamela S, PA-C ?  carvedilol (COREG) tablet 12.5 mg, 12.5 mg, Oral, BID WC, Love, Ivan Anchors, PA-C ?  [START ON 02/09/2022] cholecalciferol (VITAMIN D3) tablet 1,000 Units, 1,000 Units, Oral, Daily, Love, Ivan Anchors, PA-C ?  [START ON 02/09/2022] citalopram (CELEXA) tablet 20 mg, 20 mg, Oral, Daily, Love, Pamela S, PA-C ?  cyclobenzaprine (FLEXERIL) tablet 10 mg, 10 mg, Oral, QHS, Love, Pamela S, PA-C ?  diphenhydrAMINE (BENADRYL) 12.5 MG/5ML elixir 12.5-25 mg, 12.5-25 mg, Oral, Q6H PRN, Love, Ivan Anchors, PA-C ?  [START ON 02/09/2022] enoxaparin (LOVENOX) injection 40 mg,  40 mg, Subcutaneous, Q24H, Love, Pamela S, PA-C ?  guaiFENesin-dextromethorphan (ROBITUSSIN DM) 100-10 MG/5ML syrup 5-10 mL, 5-10 mL, Oral, Q6H PRN, Love, Pamela S, PA-C ?  insulin aspart (novoLOG) injection 0-20 Units, 0-20 Units, Subcutaneous, TID WC, Love, Pamela S, PA-C ?  insulin aspart (novoLOG) injection 0-5 Units, 0-5 Units, Subcutaneous, QHS, Love, Pamela S, PA-C ?  insulin aspart (novoLOG) injection 5 Units, 5 Units, Subcutaneous, TID WC, Love, Pamela S, PA-C ?  insulin glargine-yfgn (SEMGLEE) injection 20 Units, 20 Units, Subcutaneous, BID, Love, Pamela S, PA-C ?  [START ON 02/09/2022] lisinopril (ZESTRIL) tablet 40 mg, 40 mg, Oral, q morning, Love, Pamela S, PA-C ?  metFORMIN (GLUCOPHAGE) tablet 1,000 mg, 1,000 mg, Oral, BID WC, Love, Ivan Anchors, PA-C ?  [START ON 02/09/2022] multivitamin with minerals tablet 1 tablet, 1 tablet, Oral, Daily, Love, Pamela S, PA-C ?  nitroGLYCERIN (NITROSTAT) SL tablet 0.4 mg, 0.4 mg, Sublingual, Q5 Min x 3 PRN, Love, Pamela S, PA-C ?  polyethylene glycol (MIRALAX / GLYCOLAX) packet 17 g, 17 g, Oral, Daily PRN, Love, Ivan Anchors, PA-C ?  [START ON 02/09/2022] potassium chloride SA (KLOR-CON M) CR tablet 40 mEq, 40 mEq, Oral, Daily, Love, Ivan Anchors, PA-C ?  [START ON 02/09/2022] predniSONE (DELTASONE) tablet 80 mg, 80 mg, Oral, Q breakfast, Love, Pamela S, PA-C ?  prochlorperazine (COMPAZINE) tablet 5-10 mg, 5-10 mg, Oral, Q6H PRN **OR**  prochlorperazine (COMPAZINE) injection 5-10 mg, 5-10 mg, Intramuscular, Q6H PRN **OR** prochlorperazine (COMPAZINE) suppository 12.5 mg, 12.5 mg, Rectal, Q6H PRN, Love, Pamela S, PA-C ?  sodium phosphate (FLEET) 7-19 GM/118ML enema 1 enema, 1 enema, Rectal, Once PRN, Love, Pamela S, PA-C ?  ticagrelor (BRILINTA) tablet 90 mg, 90 mg, Oral, BID, Love, Pamela S, PA-C ?  traZODone (DESYREL) tablet 25-50 mg, 25-50 mg, Oral, QHS PRN, Love, Pamela S, PA-C ? ? ?Day of Discharge ?BP 137/76 (BP Location: Right Arm)   Pulse 76   Temp 97.8 ?F (36.6 ?C) (Oral)    Resp 17   SpO2 95%  ? ?Physical Exam: ?General: No acute respiratory distress ?Lungs: Clear to auscultation bilaterally without wheezes or crackles ?Cardiovascular: Regular rate and rhythm without murmur ga

## 2022-02-09 DIAGNOSIS — G7249 Other inflammatory and immune myopathies, not elsewhere classified: Secondary | ICD-10-CM

## 2022-02-09 LAB — COMPREHENSIVE METABOLIC PANEL
ALT: 246 U/L — ABNORMAL HIGH (ref 0–44)
AST: 84 U/L — ABNORMAL HIGH (ref 15–41)
Albumin: 3.5 g/dL (ref 3.5–5.0)
Alkaline Phosphatase: 54 U/L (ref 38–126)
Anion gap: 8 (ref 5–15)
BUN: 25 mg/dL — ABNORMAL HIGH (ref 8–23)
CO2: 27 mmol/L (ref 22–32)
Calcium: 9.3 mg/dL (ref 8.9–10.3)
Chloride: 104 mmol/L (ref 98–111)
Creatinine, Ser: 0.44 mg/dL — ABNORMAL LOW (ref 0.61–1.24)
GFR, Estimated: >60 mL/min (ref >60)
Glucose, Bld: 98 mg/dL (ref 70–99)
Potassium: 4 mmol/L (ref 3.5–5.1)
Sodium: 139 mmol/L (ref 135–145)
Total Bilirubin: 1 mg/dL (ref 0.3–1.2)
Total Protein: 6.5 g/dL (ref 6.5–8.1)

## 2022-02-09 LAB — CBC WITH DIFFERENTIAL/PLATELET
Abs Immature Granulocytes: 0.17 10*3/uL — ABNORMAL HIGH (ref 0.00–0.07)
Basophils Absolute: 0 10*3/uL (ref 0.0–0.1)
Basophils Relative: 0 %
Eosinophils Absolute: 0 10*3/uL (ref 0.0–0.5)
Eosinophils Relative: 0 %
HCT: 45.4 % (ref 39.0–52.0)
Hemoglobin: 15.7 g/dL (ref 13.0–17.0)
Immature Granulocytes: 1 %
Lymphocytes Relative: 30 %
Lymphs Abs: 4.5 10*3/uL — ABNORMAL HIGH (ref 0.7–4.0)
MCH: 32.4 pg (ref 26.0–34.0)
MCHC: 34.6 g/dL (ref 30.0–36.0)
MCV: 93.8 fL (ref 80.0–100.0)
Monocytes Absolute: 1.4 10*3/uL — ABNORMAL HIGH (ref 0.1–1.0)
Monocytes Relative: 9 %
Neutro Abs: 8.7 10*3/uL — ABNORMAL HIGH (ref 1.7–7.7)
Neutrophils Relative %: 60 %
Platelets: 336 10*3/uL (ref 150–400)
RBC: 4.84 MIL/uL (ref 4.22–5.81)
RDW: 13.5 % (ref 11.5–15.5)
WBC: 14.8 10*3/uL — ABNORMAL HIGH (ref 4.0–10.5)
nRBC: 0 % (ref 0.0–0.2)

## 2022-02-09 LAB — GLUCOSE, CAPILLARY
Glucose-Capillary: 60 mg/dL — ABNORMAL LOW (ref 70–99)
Glucose-Capillary: 85 mg/dL (ref 70–99)
Glucose-Capillary: 113 mg/dL — ABNORMAL HIGH (ref 70–99)
Glucose-Capillary: 126 mg/dL — ABNORMAL HIGH (ref 70–99)
Glucose-Capillary: 237 mg/dL — ABNORMAL HIGH (ref 70–99)

## 2022-02-09 LAB — CK: Total CK: 2085 U/L — ABNORMAL HIGH (ref 49–397)

## 2022-02-09 MED ORDER — PREDNISONE 20 MG PO TABS: 70.0000 mg | ORAL_TABLET | Freq: Every day | ORAL | Status: DC

## 2022-02-09 MED ORDER — INSULIN GLARGINE-YFGN 100 UNIT/ML ~~LOC~~ SOLN
18.0000 [IU] | Freq: Two times a day (BID) | SUBCUTANEOUS | Status: DC
  Administered 2022-02-09: 18 [IU] via SUBCUTANEOUS
  Filled 2022-02-09 (×3): qty 0.18

## 2022-02-09 MED ORDER — PREDNISONE 20 MG PO TABS
80.0000 mg | ORAL_TABLET | Freq: Every day | ORAL | Status: AC
  Administered 2022-02-09 – 2022-02-14 (×6): 80 mg via ORAL
  Filled 2022-02-09 (×6): qty 4

## 2022-02-09 MED ORDER — PREDNISONE 20 MG PO TABS
70.0000 mg | ORAL_TABLET | Freq: Every day | ORAL | Status: DC
  Administered 2022-02-15 – 2022-02-16 (×2): 70 mg via ORAL
  Filled 2022-02-09 (×2): qty 1

## 2022-02-09 MED ORDER — PREDNISONE 20 MG PO TABS
60.0000 mg | ORAL_TABLET | Freq: Every day | ORAL | Status: DC
Start: 2022-02-22 — End: 2022-02-16

## 2022-02-09 NOTE — Plan of Care (Signed)
  Problem: RH Balance Goal: LTG Patient will maintain dynamic sitting balance (PT) Description: LTG:  Patient will maintain dynamic sitting balance with assistance during mobility activities (PT) Flowsheets (Taken 02/09/2022 1301) LTG: Pt will maintain dynamic sitting balance during mobility activities with:: Independent with assistive device  Goal: LTG Patient will maintain dynamic standing balance (PT) Description: LTG:  Patient will maintain dynamic standing balance with assistance during mobility activities (PT) Flowsheets (Taken 02/09/2022 1301) LTG: Pt will maintain dynamic standing balance during mobility activities with:: Supervision/Verbal cueing   Problem: Sit to Stand Goal: LTG:  Patient will perform sit to stand with assistance level (PT) Description: LTG:  Patient will perform sit to stand with assistance level (PT) Flowsheets (Taken 02/09/2022 1301) LTG: PT will perform sit to stand in preparation for functional mobility with assistance level: Independent with assistive device   Problem: RH Bed Mobility Goal: LTG Patient will perform bed mobility with assist (PT) Description: LTG: Patient will perform bed mobility with assistance, with/without cues (PT). Flowsheets (Taken 02/09/2022 1301) LTG: Pt will perform bed mobility with assistance level of: Independent with assistive device    Problem: RH Bed to Chair Transfers Goal: LTG Patient will perform bed/chair transfers w/assist (PT) Description: LTG: Patient will perform bed to chair transfers with assistance (PT). Flowsheets (Taken 02/09/2022 1301) LTG: Pt will perform Bed to Chair Transfers with assistance level: Independent with assistive device    Problem: RH Car Transfers Goal: LTG Patient will perform car transfers with assist (PT) Description: LTG: Patient will perform car transfers with assistance (PT). Flowsheets (Taken 02/09/2022 1301) LTG: Pt will perform car transfers with assist:: Supervision/Verbal cueing    Problem: RH Ambulation Goal: LTG Patient will ambulate in controlled environment (PT) Description: LTG: Patient will ambulate in a controlled environment, # of feet with assistance (PT). Flowsheets (Taken 02/09/2022 1301) LTG: Pt will ambulate in controlled environ  assist needed:: Supervision/Verbal cueing LTG: Ambulation distance in controlled environment: 142f using LRAD Goal: LTG Patient will ambulate in home environment (PT) Description: LTG: Patient will ambulate in home environment, # of feet with assistance (PT). Flowsheets (Taken 02/09/2022 1301) LTG: Pt will ambulate in home environ  assist needed:: Supervision/Verbal cueing LTG: Ambulation distance in home environment: 57fusing LRAD   Problem: RH Stairs Goal: LTG Patient will ambulate up and down stairs w/assist (PT) Description: LTG: Patient will ambulate up and down # of stairs with assistance (PT) Flowsheets (Taken 02/09/2022 1301) LTG: Pt will ambulate up/down stairs assist needed:: Supervision/Verbal cueing LTG: Pt will  ambulate up and down number of stairs: 4 steps using HRs

## 2022-02-09 NOTE — Progress Notes (Signed)
Inpatient Rehabilitation  Patient information reviewed and entered into eRehab system by Lyan Moyano M. Leann Mayweather, M.A., CCC/SLP, PPS Coordinator.  Information including medical coding, functional ability and quality indicators will be reviewed and updated through discharge.    

## 2022-02-09 NOTE — Progress Notes (Signed)
Hypoglycemic Event ? ?CBG: 60 ? ?Treatment: 8 oz juice/soda ? ?Symptoms: None ? ?Follow-up CBG: Time:  0613 CBG Result: 85 ? ?Possible Reasons for Event: Medication regimen:   20 units semglee ? ?Comments/MD notified: protocol followed ? ? ? ?Chilton Si ? ? ?

## 2022-02-09 NOTE — Patient Care Conference (Signed)
Inpatient RehabilitationTeam Conference and Plan of Care Update Date: 02/09/2022   Time: 10:31 AM    Patient Name: [name]      Medical Record Number: [id number]  Date of Birth: 09-11-1956 Sex: Male         Room/Bed: 4M08C/4M08C-01 Payor Info: Payor: [place] / Plan: BCBS MEDICARE / Product Type: *No Product type* /    Admit Date/Time:  02/08/2022  4:14 PM  Primary Diagnosis:  <principal problem not specified>  Hospital Problems: Active Problems:   * No active hospital problems. *    Expected Discharge Date: Expected Discharge Date: 02/16/22 (short LOS; evals pending)  Team Members Present: Physician leading conference: Dr. Alysia Penna Social Worker Present: Loralee Pacas, [place] Nurse Present: Dorien Chihuahua, RN PT Present: Page Spiro, PT OT Present: Willeen Cass, OT SLP Present: Sherren Kerns, SLP PPS Coordinator present : Gunnar Fusi, SLP     Current Status/Progress Goal Weekly Team Focus  Bowel/Bladder     Continent of bowel and bladder        Swallow/Nutrition/ Hydration             ADL's             Mobility   eval pending         Communication             Safety/Cognition/ Behavioral Observations            Pain     N/a        Skin     N/a          Discharge Planning:  2 different sisters, each of which are able to come stay with him for 1 week after D/C (giving him 2 weeks of 24/7 support     Team Discussion: 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 On prednisone taper for myositis. Shoulder strength limitations affecting progress.  Patient on target to meet rehab goals: yes, currently at min assist level for PT. Goals for discharge set for mod I overall.  *See Care Plan and progress notes for long and short-term goals.   Revisions to Treatment Plan:  N/A   Teaching Needs: Safety, transfers, toileting, medications, etc.   Current Barriers to Discharge: Decreased caregiver  support, Home enviroment access/layout, and dr ives a manual Jeep & would be concerned with his LE weakness trying to manage the clutch.  Possible Resolutions to Barriers: Family education OP follow up services DME: RW     Medical Summary Current Status: proximal muscle weakness, BP mildly elevated, +constipation  Barriers to Discharge: Medical stability;Other (comments)  Barriers to Discharge Comments: high dose steroid taper Possible Resolutions to Barriers/Weekly Focus: Elevated CBGs, fatigues easily       I attest that I was present, lead the team conference, and concur with the assessment and plan of the team.   Dorien Chihuahua B 02/09/2022, 4:01 PM

## 2022-02-09 NOTE — Progress Notes (Signed)
Physical Therapy Session Note  Patient Details  Name: [name] MRN: [id number] Date of Birth: [birth year]  Today's Date: 02/10/2022 PT Individual Time: 8786 - 7672   PT Individual Time Calculation (min): 71 min   Short Term Goals: Week 1:  PT Short Term Goal 1 (Week 1): = to LTGs based on ELOS  Skilled Therapeutic Interventions/Progress Updates:  Pt received asleep, resting in bed but easily awakens and agreeable to therapy session. Supine>sitting R EOB, HOB flat and using bedrail, with CGA/light min assist for bringing trunk upright as pt with significant core and proximal B UE strength. Reports need to use bathroom. Sit>stand EOB>RW with CGA for steadying. Gait in/out bathroom using RW with CGA for steadying. Standing at toilet performed LB clothing management and voided bladder without assist.  Gait training ~14f to main therapy gym using RW with CGA for steadying - continues to have reciprocal pattern with slight decrease in gait speed with only slight instability noted.    Sit>supine on mat table with CGA for trunk descent as pt with poor eccentric control and pt having to use B UEs to assist with bringing LEs up onto mat.  Lying on mat performed the following exercises:  - bridging with red (level 2) theraband resistance promoting increased hip abduction activation 2 x10reps - supine R LE straight leg raises with active assist 2 x10reps, cuing to maintain knee extended throughout ROM - requires mod assist to lift the leg - supine L LE straight leg raises with active assist 2 x10 reps - requires mod assist to lift the leg - L sidelying R LE clamshells with red (level 2) theraband resistance 2x10 reps with mod cuing for proper form/technique - R sidelying L LE clamshells started with red (level 2) theraband resistance but had to remove due to significant weakness on this side 2x10 reps with min cuing for proper form/technique  R sidelying to sitting on EOM with min assist for  bringing trunk upright  Standing>tall kneeling on mat with B UE support on bench with min assist for balance - pt able to bring LEs up onto mat without assist. In tall kneeling with CGA/min assist, focused on decreasing UE support by holding hands up and then performing single then bimanual arm movements targeting increased core activation. Pt reports quick fatigue with this task and states it is very challenging. Also, reports slight discomfort in R knee in this position.  Standing to quadruped on mat table with min assist. In quadruped with CGA/min assist for stability (pt reporting most unstable during single R UE support), performed alternate arm lifts progressed to alternate cross body reaching targeting increased core activation.  Block practice sitting<>supine on mat going in both directions working on bed mobility training for increased independence with CGA for safety and curing for sequencing of logroll technique - pt continues to use UEs to assist with B LE management up onto bed.   Gait back to room using RW with CGA - demonstrates slight hip instability during gait at this time due to minor fatigue but continues to maintain reciprocal stepping pattern with decreased gait speed.  Pt ambulated in/out bathroom using RW with CGA for safety and with supervision able to complete all standing toileting tasks without assist and continent of urine.   At end of session, pt left seated in recliner with needs in reach and chair alarm on.   Therapy Documentation Precautions:  Precautions Precautions: Fall, Other (comment) Precaution Comments: proximal muscle weakness Restrictions Weight Bearing  Restrictions: No   Pain: Denies pain during session.    Therapy/Group: Individual Therapy  Tawana Scale , PT, DPT, NCS, CSRS 02/09/2022, 3:23 PM

## 2022-02-09 NOTE — Evaluation (Signed)
Physical Therapy Assessment and Plan  Patient Details  Name: [name] MRN: [id number] Date of Birth: [birth year]  PT Diagnosis: Abnormal posture, Abnormality of gait, Difficulty walking, Muscle weakness, and Quadriplegia Rehab Potential: Good ELOS: ~7-10 days   Today's Date: 02/09/2022 PT Individual Time: 3646-8032 PT Individual Time Calculation (min): 55 min    Hospital Problem: Active Problems:   * No active hospital problems. *   Past Medical History:  Past Medical History:  Diagnosis Date   Arthritis    Diabetes mellitus    fasting 120s   History of kidney stones    Hypertension    Peripheral neuropathy    left fifth finger, left toe   Pneumonia    hx of   Past Surgical History:  Past Surgical History:  Procedure Laterality Date   APPENDECTOMY     CORONARY STENT INTERVENTION N/A 12/28/2021   Procedure: CORONARY STENT INTERVENTION;  Surgeon: Jettie Booze, MD;  Location: [facility];  Service: Cardiovascular;  Laterality: N/A;   CYSTOSCOPY W/ URETERAL STENT PLACEMENT Bilateral 03/17/2014   Procedure: CYSTOSCOPY WITH RIGHT STENT PULL  ;  Surgeon: Alexis Frock, MD;  Location: [facility];  Service: Urology;  Laterality: Bilateral;   CYSTOSCOPY WITH RETROGRADE PYELOGRAM, URETEROSCOPY AND STENT PLACEMENT Bilateral 02/01/2014   Procedure: CYSTOSCOPY WITH BILATERAL RETROGRADE CYSTOSCOPY, BILATERAL RETROGRADE, PYELOGRAM, RIGHT URETEROSCOPY, STONE BASKETRY,  AND BILATERAL STENT PLACEMENT;  Surgeon: Alexis Frock, MD;  Location: [facility];  Service: Urology;  Laterality: Bilateral;   CYSTOSCOPY WITH URETEROSCOPY AND STENT PLACEMENT Left 02/12/2021   Procedure: CYSTOSCOPY WITH LEFT URETEROSCOPY, BALLOON DILITATION, HOLMIUM LASER LITHOTRIPSY AND STENT PLACEMENT;  Surgeon: Lucas Mallow, MD;  Location: [facility];  Service: Urology;  Laterality: Left;   HOLMIUM LASER APPLICATION Left 10/18/4823   Procedure: HOLMIUM LASER APPLICATION;  Surgeon: Alexis Frock, MD;  Location:  [facility];  Service: Urology;  Laterality: Left;   INTRAVASCULAR ULTRASOUND/IVUS N/A 12/28/2021   Procedure: Intravascular Ultrasound/IVUS;  Surgeon: Jettie Booze, MD;  Location: [facility];  Service: Cardiovascular;  Laterality: N/A;   LEFT HEART CATH AND CORONARY ANGIOGRAPHY N/A 12/28/2021   Procedure: LEFT HEART CATH AND CORONARY ANGIOGRAPHY;  Surgeon: Jettie Booze, MD;  Location: [facility];  Service: Cardiovascular;  Laterality: N/A;   LITHOTRIPSY     NEPHROLITHOTOMY Left 03/17/2014   Procedure: NEPHROLITHOTOMY PERCUTANEOUS with access;  Surgeon: Alexis Frock, MD;  Location: [facility];  Service: Urology;  Laterality: Left;   NEPHROLITHOTOMY Left 03/19/2014   Procedure: 2ND STAGE NEPHROLITHOTOMY PERCUTANEOUS left ureteroscopy with lasr stone treatment and stent placement, with access;  Surgeon: Alexis Frock, MD;  Location: [facility];  Service: Urology;  Laterality: Left;   TOTAL KNEE ARTHROPLASTY Right 09/02/2014   Procedure: RIGHT TOTAL KNEE ARTHROPLASTY;  Surgeon: Meredith Pel, MD;  Location: [place];  Service: Orthopedics;  Laterality: Right;   uretral stent      Assessment & Plan Clinical Impression: Patient is a 66 y.o. year old male with history of T2DM, HTN,  intolerance of statin due to abnormal LFTs (off for the past year), CAD s/p DES to RCA  12/28/21 (addition of Atorvastatin 20 mg, ASA/Brilinta), reports of 30 lbs unintentional wt loss over a year and  progressive BUE and BLE weakness over past 4-5 months. Since starting statin again, he started having near falls, significant worsening of weakness and presented to ED for work up on 02/03/22. MRI brain done revealing acute right pontine infarct felt to be  incidental. CTA head/neck was negative for LVO but showed extensive atherosclerosis,  70% R-ICA stenosis, 50% L-ICA stenosis, 50% R-VA and 40% L-VA stenosis. MRI spine showed cervical, thoracic and lumbar spine negative for cord compression or inflammatory  changes. CT abdomen/pelvis was negative for metastatic disease, intra-abdominal or intrathoracic pathology with moderate non-obst left nephrolithiasis with moderate cortical atrophy. 2D echo/bubble study was negative for IA shunt.    Neurology consulted and felt that symptoms concerning for myopathy--CK elevated at 7291.  He was treated with 3 day course of solumedrol as well as IVF for hydration. General surgery consulted for muscle biopsy but would need to be off Brilinta for procedure. Cardiology recommended waiting at least 3 months due to need for DAPT secondary to recent drug eluting stent and plans biopsy in 3-4 months.  HMG-COA reductase antibodies ordered as well as recommendations for muscle biopsy. 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 Also rate of improvement since steroids initiated also favors inflammatory myopathy. 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  Abnormal LFTs and CK trending down (1195 today).  Has completed 3 days course of solumedrol and strength felt to have plateaued-->transitioned to prednisone 80 mg today. Patient continues to be limited by weakness (legs gave out this am with nursing), increased unsteadiness noted today (felt to be due to overdoing exercises yesterday). Patient transferred to CIR on 02/08/2022 .   Patient currently requires CGA/min assist with mobility secondary to muscle weakness, decreased cardiorespiratoy endurance, impaired timing and sequencing and unbalanced muscle activation, and decreased sitting balance, decreased standing balance, decreased postural control, and decreased balance strategies.  Prior to hospitalization, patient was independent  with mobility and lived Alone in a House home.  Home access is 1Level entry (per chart review pt has 1 step-up onto porch   ).  Patient will benefit from skilled PT intervention to maximize safe functional mobility, minimize fall risk, and decrease caregiver burden for planned discharge home with 24 hour supervision.  Anticipate patient will benefit from follow up OP at discharge.  PT - End of Session Activity Tolerance: Tolerates 30+ min activity with multiple rests Endurance Deficit: Yes Endurance Deficit Description: requires seated rest breaks PT Assessment Rehab Potential (ACUTE/IP ONLY): Good PT Barriers to Discharge: Decreased caregiver support PT Patient demonstrates impairments in the following area(s): Balance;Safety;Edema;Sensory;Endurance;Skin Integrity;Motor;Nutrition;Pain PT Transfers Functional Problem(s): Bed Mobility;Bed to Chair;Car;Furniture;Floor PT Locomotion Functional Problem(s): Ambulation;Stairs PT Plan PT Intensity: Minimum of 1-2 x/day ,45 to 90 minutes PT Frequency: 5 out of 7 days PT Duration Estimated Length of Stay: ~7-10 days PT Treatment/Interventions: Ambulation/gait training;Community reintegration;DME/adaptive equipment instruction;Neuromuscular re-education;Psychosocial support;Stair training;UE/LE Strength taining/ROM;Balance/vestibular training;Discharge planning;Pain management;Functional electrical stimulation;Skin care/wound management;Therapeutic Activities;UE/LE Coordination activities;Disease management/prevention;Functional mobility training;Patient/family education;Splinting/orthotics;Therapeutic Exercise PT Transfers Anticipated Outcome(s): mod-I using LRAD PT Locomotion Anticipated Outcome(s): supervision using LRAD PT Recommendation Recommendations for Other Services: Therapeutic Recreation consult Therapeutic Recreation Interventions: Stress management;Outing/community reintergration Follow Up Recommendations: 24 hour supervision/assistance;Outpatient PT Patient destination: Home Equipment Recommended: To be determined Equipment Details: pt only has  cane   PT Evaluation Precautions/Restrictions Precautions Precautions: Fall;Other (comment) Precaution Comments: proximal muscle weakness Restrictions Weight Bearing Restrictions: No Pain Pain Assessment Pain Scale: 0-10 Pain Score: 0-No pain Pain Interference Pain Interference Pain Effect on Sleep: 1. Rarely or not at all Pain Interference with Therapy Activities: 1. Rarely or not at all  Pain Interference with Day-to-Day Activities: 1. Rarely or not at all Home Living/Prior Functioning Home Living Available Help at Discharge: Family;Available 24 hours/day (pt's 2 sisters planning to provide 24/7 support for 2 weeks after D/C  ) Type of Home: House Home Access: Level entry (per chart review pt has 1 step-up onto porch  ) Entrance Stairs-Number of Steps: 1 Entrance Stairs-Rails: None Home Layout: One level Bathroom Shower/Tub: Engineer, manufacturing systems: Standard  Lives With: Alone Prior Function Level of Independence: Independent with gait;Independent with homemaking with ambulation;Independent with transfers (had recently started using cane for community mobility due to progressively worsening weakness  )  Able to Take Stairs?: Yes Driving: Yes Vocation: Full time employment Vocation Requirements: Therapist, 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 [employer]Perception  Vision - History Ability to See in Adequate Light: 0 Adequate Perception Perception: Within Functional Limits Praxis Praxis: Intact  Cognition Overall Cognitive Status: Within Functional Limits for tasks assessed Arousal/Alertness: Awake/alert Orientation Level: Oriented X4 Year: 2023 Month: May Day of Week: Correct Attention: Focused;Sustained;Selective Focused Attention: Appears intact Sustained Attention: Appears intact Selective Attention: Appears intact Memory: Appears intact Awareness: Appears intact Safety/Judgment: Appears  intact Sensation Sensation Light Touch: Appears Intact Hot/Cold: Not tested Proprioception: Appears Intact Stereognosis: Not tested Coordination Gross Motor Movements are Fluid and Coordinated: No Coordination and Movement Description: core and proximal B UE and B LE weakness with generalized deconditioning Motor  Motor Motor: Other (comment) Motor - Skilled Clinical Observations: core and proximal B UE and B LE weakness with generalized deconditioning   Trunk/Postural Assessment  Cervical Assessment Cervical Assessment: Within Functional Limits Thoracic Assessment Thoracic Assessment: Exceptions to [facility] (thoracic rounding) Lumbar Assessment Lumbar Assessment: Exceptions to [facility] (posterior pelvic tilt in sitting) Postural Control Postural Control: Deficits on evaluation Trunk Control: core weakness for high level dynaimc sitting balance tasks Postural Limitations: decreased  Balance Balance Balance Assessed: Yes Static Sitting Balance Static Sitting - Balance Support: Feet supported Static Sitting - Level of Assistance: 5: Stand by assistance Dynamic Sitting Balance Dynamic Sitting - Balance Support: Feet supported Dynamic Sitting - Level of Assistance: 5: Stand by assistance Static Standing Balance Static Standing - Balance Support: During functional activity;Bilateral upper extremity supported Static Standing - Level of Assistance: Other (comment) (CGA) Dynamic Standing Balance Dynamic Standing - Balance Support: During functional activity;Bilateral upper extremity supported Dynamic Standing - Level of Assistance: 4: Min assist;Other (comment) (CGA) Extremity Assessment      RLE Assessment RLE Assessment: Exceptions to [facility] Active Range of Motion (AROM) Comments: WFL General Strength Comments: assessed in sitting RLE Strength Right Hip Flexion: 3-/5 Right Knee Flexion: 3+/5 Right Knee Extension: 3+/5 Right Ankle Dorsiflexion: 4-/5 Right Ankle Plantar Flexion:  4-/5 LLE Assessment LLE Assessment: Exceptions to [facility] Active Range of Motion (AROM) Comments: WFL General Strength Comments: assessed in sitting LLE Strength Left Hip Flexion: 2+/5 Left Knee Flexion: 3+/5 Left Knee Extension: 4-/5 Left Ankle Dorsiflexion: 4-/5 Left Ankle Plantar Flexion: 4-/5  Care Tool Care Tool Bed Mobility Roll left and right activity   Roll left and right assist level: Contact Guard/Touching assist    Sit to lying activity   Sit to lying assist level: Contact Guard/Touching assist    Lying to sitting on side of bed activity   Lying to sitting on side of bed assist level: the ability to move from lying on the back to sitting on the side of the bed with no back support.: Contact Guard/Touching assist  Care Tool Transfers Sit to stand transfer   Sit to stand assist level: Contact Guard/Touching assist    Chair/bed transfer   Chair/bed transfer assist level: Minimal Assistance - Patient > 75%     Physiological scientist transfer assist level: Minimal Assistance - Patient > 75%      Care Tool Locomotion Ambulation   Assist level: Minimal Assistance - Patient > 75% Assistive device: No Device Max distance: 246ft  Walk 10 feet activity   Assist level: Minimal Assistance - Patient > 75% Assistive device: No Device   Walk 50 feet with 2 turns activity   Assist level: Minimal Assistance - Patient > 75% Assistive device: No Device  Walk 150 feet activity   Assist level: Minimal Assistance - Patient > 75% Assistive device: No Device  Walk 10 feet on uneven surfaces activity   Assist level: Minimal Assistance - Patient > 75% Assistive device: Other (comment) (railing)  Stairs   Assist level: Minimal Assistance - Patient > 75% Stairs assistive device: 2 hand rails Max number of stairs: 8  Walk up/down 1 step activity   Walk up/down 1 step (curb) assist level: Minimal Assistance - Patient > 75% Walk up/down 1 step or curb  assistive device: 2 hand rails  Walk up/down 4 steps activity   Walk up/down 4 steps assist level: Minimal Assistance - Patient > 75% Walk up/down 4 steps assistive device: 2 hand rails  Walk up/down 12 steps activity Walk up/down 12 steps activity did not occur: Safety/medical concerns      Pick up small objects from floor   Pick up small object from the floor assist level: Moderate Assistance - Patient 50 - 74%    Wheelchair Is the patient using a wheelchair?: No          Wheel 50 feet with 2 turns activity      Wheel 150 feet activity        Refer to Care Plan for Long Term Goals  SHORT TERM GOAL WEEK 1 PT Short Term Goal 1 (Week 1): = to LTGs based on ELOS  Recommendations for other services: Therapeutic Recreation  Stress management and Outing/community reintegration  Skilled Therapeutic Intervention Pt received sitting in recliner and agreeable to therapy session. Evaluation completed (see details above) with patient education regarding purpose of PT evaluation, PT POC and goals, therapy schedule, weekly team meetings, and other CIR information including safety plan and fall risk safety. Pt completed the below functional mobility tasks with the specified skilled cuing and assistance. Educated pt on importance of not working past the point of fatigue based on medical diagnosis. During gait training without AD pt demonstrates increased postural instability with L anterior trunk lean bias. At end of session, pt left sitting in recliner with needs in reach and chair alarm on.  Mobility Bed Mobility Bed Mobility: Supine to Sit;Sit to Supine Supine to Sit: Contact Guard/Touching assist Sit to Supine: Contact Guard/Touching assist Transfers Transfers: Sit to Stand;Stand to Sit;Stand Pivot Transfers Sit to Stand: Contact Guard/Touching assist Stand to Sit: Contact Guard/Touching assist Stand Pivot Transfers: Contact Guard/Touching assist;Minimal Assistance - Patient > 75% (CGA  when using RW and light min assist without AD) Stand Pivot Transfer Details: Tactile cues for sequencing;Tactile cues for initiation;Tactile cues for weight shifting;Verbal cues for precautions/safety;Verbal cues for technique;Verbal cues for sequencing;Verbal cues for gait pattern;Verbal cues for safe use of DME/AE Transfer (Assistive device): Rolling walker Locomotion  Gait Ambulation: Yes Gait Assistance: Contact Guard/Touching assist;Minimal Assistance - Patient > 75% (CGA using RW but light min assist without AD) Gait Distance (Feet): 200 Feet Assistive device: Rolling walker;None Gait Assistance Details: Tactile cues for weight shifting;Tactile cues for posture;Manual facilitation for weight shifting;Verbal cues for gait pattern;Verbal cues for sequencing;Visual cues/gestures for sequencing;Verbal cues for precautions/safety Gait Gait: Yes Gait Pattern: Impaired Gait Pattern: Wide base of support;Step-through pattern (without AD demonstrates overall increased postural instability with primarily L lateral lean) Gait velocity: decreased Stairs / Additional Locomotion Stairs: Yes Stairs Assistance: Minimal Assistance - Patient > 75%;Contact Guard/Touching assist Stair Management Technique: Two rails;Alternating pattern;Forwards (reciprocal pattern during ascent and descent) Number of Stairs: 8 Height of Stairs: 6 Ramp: Minimal Assistance - Patient >75% (using railing) Curb: Minimal Assistance - Patient >75% Wheelchair Mobility Wheelchair Mobility: No   Discharge Criteria: Patient will be discharged from PT if patient refuses treatment 3 consecutive times without medical reason, if treatment goals not met, if there is a change in medical status, if patient makes no progress towards goals or if patient is discharged from hospital.  The above assessment, treatment plan, treatment alternatives and goals were discussed and mutually agreed upon: by patient  Tawana Scale , PT, DPT, NCS,  CSRS 02/09/2022, 7:59 AM

## 2022-02-09 NOTE — Progress Notes (Signed)
?                                                       PROGRESS NOTE ? ? ?Subjective/Complaints: ? ?Feels weak in shoulders and legs  ? ?ROS- neg CP, SOB, N/V/D ? ?Objective: ?  ?No results found. ?Recent Labs  ?  02/09/22 ?0619  ?WBC 14.8*  ?HGB 15.7  ?HCT 45.4  ?PLT 336  ? ?Recent Labs  ?  02/08/22 ?0141 02/09/22 ?0619  ?NA 137 139  ?K 4.4 4.0  ?CL 104 104  ?CO2 24 27  ?GLUCOSE 239* 98  ?BUN 29* 25*  ?CREATININE 0.57* 0.44*  ?CALCIUM 9.0 9.3  ? ? ?Intake/Output Summary (Last 24 hours) at 02/09/2022 0809 ?Last data filed at 02/08/2022 1900 ?Gross per 24 hour  ?Intake 240 ml  ?Output --  ?Net 240 ml  ?  ? ?  ? ?Physical Exam: ?Vital Signs ?Blood pressure (!) 146/74, pulse 65, temperature 98.1 ?F (36.7 ?C), resp. rate 18, height '5\' 10"'$  (1.778 m), weight 80.3 kg, SpO2 97 %. ? ? ?General: No acute distress ?Mood and affect are appropriate ?Heart: Regular rate and rhythm no rubs murmurs or extra sounds ?Lungs: Clear to auscultation, breathing unlabored, no rales or wheezes ?Abdomen: Positive bowel sounds, soft nontender to palpation, nondistended ?Extremities: No clubbing, cyanosis, or edema ?Skin: No evidence of breakdown, no evidence of rash ?Neurologic: Cranial nerves II through XII intact, motor strength is 3-/5 in bilateral deltoid, 4- bicep, tricep,5/5 grip, 3- hip flexor, 4/5 knee extensors, 5/5 ankle dorsiflexor and plantar flexor ? ?Musculoskeletal: Full range of motion in all 4 extremities. No joint swelling ? ? ?Assessment/Plan: ?1. Functional deficits which require 3+ hours per day of interdisciplinary therapy in a comprehensive inpatient rehab setting. ?Physiatrist is providing close team supervision and 24 hour management of active medical problems listed below. ?Physiatrist and rehab team continue to assess barriers to discharge/monitor patient progress toward functional and medical goals ? ?Care Tool: ? ?Bathing ?   ?   ?   ?  ?  ?Bathing assist   ?  ?  ?Upper Body Dressing/Undressing ?Upper body  dressing   ?What is the patient wearing?: [place] only ?   ?Upper body assist Assist Level: Contact Guard/Touching assist ?   ?Lower Body Dressing/Undressing ?Lower body dressing ? ? ?   ?  ? ?  ? ?Lower body assist   ?   ? ?Toileting ?Toileting    ?Toileting assist Assist for toileting: Contact Guard/Touching assist ?  ?  ?Transfers ?Chair/bed transfer ? ?Transfers assist ?   ? ?  ?  ?  ?Locomotion ?Ambulation ? ? ?Ambulation assist ? ?   ? ?  ?  ?   ? ?Walk 10 feet activity ? ? ?Assist ?   ? ?  ?   ? ?Walk 50 feet activity ? ? ?Assist   ? ?  ?   ? ? ?Walk 150 feet activity ? ? ?Assist   ? ?  ?  ?  ? ?Walk 10 feet on uneven surface  ?activity ? ? ?Assist   ? ? ?  ?   ? ?Wheelchair ? ? ? ? ?Assist   ?  ?  ? ?  ?   ? ? ?Wheelchair 50 feet with 2 turns activity ? ? ? ?  Assist ? ?  ?  ? ? ?   ? ?Wheelchair 150 feet activity  ? ? ? ?Assist ?   ? ? ?   ? ?Blood pressure (!) 146/74, pulse 65, temperature 98.1 ?F (36.7 ?C), resp. rate 18, height '5\' 10"'$  (1.778 m), weight 80.3 kg, SpO2 97 %. ? ?  ?Medical Problem List and Plan: ?1. Functional deficits secondary Inflammatory myositis ?            -Statin induced myopathy also in differential ?            -patient may  shower ?            -ELOS/Goals: 5-7 days ?            -Admit to CIR ?2.  Antithrombotics: ?-DVT/anticoagulation:  Pharmaceutical: Lovenox ?            -antiplatelet therapy: ASA/Brilinta ?3. Pain Management: Tylenol prn.  ?4. Mood: LCSW to follow for evaluation and support.  ?            -antipsychotic agents: N/A ?5. Neuropsych: This patient is capable of making decisions on his own behalf. ?6. Skin/Wound Care: Routine pressure relief measures.  ?7. Fluids/Electrolytes/Nutrition: Monitor I/O. Check CMET/CK in am. ?8.  Inflammatory myositis v/s statin induced myositis: ?            -Outpatient muscle biopsy in about 3-4 months ?            -Prednisone '80mg'$  PO qd ?9. CAD s/p DES to RCA: Monitor for symptoms with increase in activity.  ?            --on ASA,  Brilinta, coreg and lisinopril --was to start Repatha PTA.   ?            -Denies Chest pain ?10. T2DM: Hgb A1c-7.4. Monitor BS ac/hs and use SSI for elevated BS.  ?--Was on metformin 1000 mg bid, Lantus 30 u/hs and Actos 30 mg PTA.  ?            --Blood sugars poorly controlled likely due to solumedrol--now on 80 mg daily ?--Anticipate improvement with increase in activity and question taper. ?            --Continue insulin glargine bid and resume metformin for better control.  ?CBG (last 3)  ?Recent Labs  ?  02/08/22 ?2106 02/09/22 ?8676 02/09/22 ?7209  ?GLUCAP 132* 60* 85  ? ?Expect improvement with steroid taper, reduce semglee due to early am CBG drop  ?11. HTN: Monitor BP TID--continue Norvasc, Lisinopril and coreg. ?            Overall well controlled, continue to monitor ?12.  Pre-renal azotemia: Encourage fluid intake.  ?--Recheck BUN/SCr in am ?13. Abnormal LFTs: Resolving. Recheck in am.  ?14. Hypokalemia: Mg- 2.1 and WNL. Likely due to steroids.  ?--Continue Kdur 40 meq daily for supplement.  ?15.  Right pontine infarct noted on MRI ?  ? ?LOS: ?1 days ?A FACE TO FACE EVALUATION WAS PERFORMED ? ?[name] ?02/09/2022, 8:09 AM  ? ? ? ?

## 2022-02-09 NOTE — Evaluation (Signed)
Occupational Therapy Assessment and Plan  Patient Details  Name: [name] MRN: [id number] Date of Birth: 03-20-1956  OT Diagnosis: abnormal posture and muscle weakness (generalized) Rehab Potential: Rehab Potential (ACUTE ONLY): Excellent ELOS: 7 days   Today's Date: 02/10/2022 OT Individual Time: 1194-1740  OT Individual Time Calculation (min): 40 min   Hospital Problem: Active Problems:   * No active hospital problems. *   Past Medical History:  Past Medical History:  Diagnosis Date   Arthritis    Diabetes mellitus    fasting 120s   History of kidney stones    Hypertension    Peripheral neuropathy    left fifth finger, left toe   Pneumonia    hx of   Past Surgical History:  Past Surgical History:  Procedure Laterality Date   APPENDECTOMY     CORONARY STENT INTERVENTION N/A 12/28/2021   Procedure: CORONARY STENT INTERVENTION;  Surgeon: Jettie Booze, MD;  Location: [facility];  Service: Cardiovascular;  Laterality: N/A;   CYSTOSCOPY W/ URETERAL STENT PLACEMENT Bilateral 03/17/2014   Procedure: CYSTOSCOPY WITH RIGHT STENT PULL  ;  Surgeon: Alexis Frock, MD;  Location: [facility];  Service: Urology;  Laterality: Bilateral;   CYSTOSCOPY WITH RETROGRADE PYELOGRAM, URETEROSCOPY AND STENT PLACEMENT Bilateral 02/01/2014   Procedure: CYSTOSCOPY WITH BILATERAL RETROGRADE CYSTOSCOPY, BILATERAL RETROGRADE, PYELOGRAM, RIGHT URETEROSCOPY, STONE BASKETRY,  AND BILATERAL STENT PLACEMENT;  Surgeon: Alexis Frock, MD;  Location: [facility];  Service: Urology;  Laterality: Bilateral;   CYSTOSCOPY WITH URETEROSCOPY AND STENT PLACEMENT Left 02/12/2021   Procedure: CYSTOSCOPY WITH LEFT URETEROSCOPY, BALLOON DILITATION, HOLMIUM LASER LITHOTRIPSY AND STENT PLACEMENT;  Surgeon: Lucas Mallow, MD;  Location: [facility];  Service: Urology;  Laterality: Left;   HOLMIUM LASER APPLICATION Left 05/09/4817   Procedure: HOLMIUM LASER APPLICATION;  Surgeon: Alexis Frock, MD;  Location: [facility];   Service: Urology;  Laterality: Left;   INTRAVASCULAR ULTRASOUND/IVUS N/A 12/28/2021   Procedure: Intravascular Ultrasound/IVUS;  Surgeon: Jettie Booze, MD;  Location: [facility];  Service: Cardiovascular;  Laterality: N/A;   LEFT HEART CATH AND CORONARY ANGIOGRAPHY N/A 12/28/2021   Procedure: LEFT HEART CATH AND CORONARY ANGIOGRAPHY;  Surgeon: Jettie Booze, MD;  Location: [facility];  Service: Cardiovascular;  Laterality: N/A;   LITHOTRIPSY     NEPHROLITHOTOMY Left 03/17/2014   Procedure: NEPHROLITHOTOMY PERCUTANEOUS with access;  Surgeon: Alexis Frock, MD;  Location: [facility];  Service: Urology;  Laterality: Left;   NEPHROLITHOTOMY Left 03/19/2014   Procedure: 2ND STAGE NEPHROLITHOTOMY PERCUTANEOUS left ureteroscopy with lasr stone treatment and stent placement, with access;  Surgeon: Alexis Frock, MD;  Location: [facility];  Service: Urology;  Laterality: Left;   TOTAL KNEE ARTHROPLASTY Right 09/02/2014   Procedure: RIGHT TOTAL KNEE ARTHROPLASTY;  Surgeon: Meredith Pel, MD;  Location: [facility];  Service: Orthopedics;  Laterality: Right;   uretral stent      Assessment & Plan Clinical Impression: [name]. Sprinkle is a 66 year old male with history of T2DM, HTN,  intolerance of statin due to abnormal LFTs (off for the past year), CAD s/p DES to RCA  12/28/21 (addition of Atorvastatin 20 mg, ASA/Brilinta), reports of 30 lbs unintentional wt loss over a year and  progressive BUE and BLE weakness over past 4-5 months. Since starting statin again, he started having near falls, significant worsening of weakness and presented to ED for work up on 02/03/22. MRI brain done revealing acute right pontine infarct felt to be  incidental. CTA head/neck was negative for LVO but showed extensive atherosclerosis,  70% R-ICA stenosis, 50% L-ICA stenosis, 50% R-VA and 40% L-VA stenosis. MRI spine showed cervical, thoracic and lumbar spine negative for cord compression or inflammatory changes. CT  abdomen/pelvis was negative for metastatic disease, intra-abdominal or intrathoracic pathology with moderate non-obst left nephrolithiasis with moderate cortical atrophy. 2D echo/bubble study was negative for IA shunt.    Neurology consulted and felt that symptoms concerning for myopathy--CK elevated at 7291.  He was treated with 3 day course of solumedrol as well as IVF for hydration. General surgery consulted for muscle biopsy but would need to be off Brilinta for procedure. Cardiology recommended waiting at least 3 months due to need for DAPT secondary to recent drug eluting stent and plans biopsy in 3-4 months.  HMG-COA reductase antibodies ordered as well as recommendations for muscle biopsy. 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 Also rate of improvement since steroids initiated also favors inflammatory myopathy. 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  Abnormal LFTs and CK trending down (1195 today).  Has completed 3 days course of solumedrol and strength felt to have plateaued-->transitioned to prednisone 80 mg today. Patient continues to be limited by weakness (legs gave out this am with nursing), increased unsteadiness noted today (felt to be due to overdoing exercises yesterday)   Denies any significant pain at this time. He does feel reports he feels very fatigued overall. Last BM was about 2 days ago and he does not feel constipated.     Patient transferred to CIR on 02/08/2022 .    Patient currently requires CGA-supervision with basic self-care skills secondary to muscle weakness, decreased cardiorespiratoy endurance, and decreased sitting balance, decreased standing balance, decreased postural control, and decreased balance strategies.  Prior to hospitalization, patient could complete ADLs and IADls with  independent .  Patient will benefit from skilled intervention to increase independence with basic self-care skills prior to discharge home with care partner.  Anticipate patient will require 24 hour supervision and follow up outpatient.  OT - End of Session Activity Tolerance: Tolerates 30+ min activity with multiple rests Endurance Deficit: Yes Endurance Deficit Description: requires seated rest breaks OT Assessment Rehab Potential (ACUTE ONLY): Excellent OT Patient demonstrates impairments in the following area(s): Balance;Endurance;Motor OT Basic ADL's Functional Problem(s): Grooming;Dressing;Bathing;Toileting OT Transfers Functional Problem(s): Toilet;Tub/Shower OT Additional Impairment(s): Fuctional Use of Upper Extremity OT Plan OT Intensity: Minimum of 1-2 x/day, 45 to 90 minutes OT Frequency: 5 out of 7 days OT Duration/Estimated Length of Stay: 7 days OT Treatment/Interventions: Balance/vestibular training;Discharge planning;Functional electrical stimulation;Pain management;Therapeutic Activities;Self Care/advanced ADL retraining;UE/LE Coordination activities;Disease mangement/prevention;Functional mobility training;Patient/family education;Therapeutic Exercise;Community reintegration;DME/adaptive equipment instruction;Neuromuscular re-education;Psychosocial support;UE/LE Strength taining/ROM OT Basic Self-Care Anticipated Outcome(s): mod I-sup OT Toileting Anticipated Outcome(s): mod I OT Bathroom Transfers Anticipated Outcome(s): mod I OT Recommendation Patient destination: Home Follow Up Recommendations: 24 hour supervision/assistance;Outpatient OT Equipment Recommended: To be determined   OT Evaluation Precautions/Restrictions  Precautions Precautions: Fall;Other (comment) Precaution Comments: proximal muscle weakness Restrictions Weight Bearing Restrictions: No General Chart Reviewed: Yes Pain Pain Assessment Pain Scale: 0-10 Pain Score: 0-No pain Home  Living/Prior Functioning Home Living Available Help at Discharge: Family, Available 24 hours/day (pt's 2 sisters planning to provide 24/7 support for 2 weeks after D/C  ) Type of Home: House Home Access: Level  entry (per chart review pt has 1 step-up onto porch  ) Entrance Stairs-Number of Steps: 1 Entrance Stairs-Rails: None Home Layout: One level Bathroom Shower/Tub: Chiropodist: Standard  Lives With: Alone IADL History Education: 3 years college Prior Function Level of Independence: Independent with gait, Independent with homemaking with ambulation, Independent with transfers (had recently started using cane for community mobility due to progressively worsening weakness  )  Able to Take Stairs?: Yes Driving: Yes Vocation: Full time employment 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 [employer]/History: 1 Wears glasses Ability to See in Adequate Light: 0 Adequate Patient Visual Report: No change from baseline Vision Assessment?: No apparent visual deficits Perception  Perception: Within Functional Limits Praxis Praxis: Intact Cognition Cognition Overall Cognitive Status: Within Functional Limits for tasks assessed Arousal/Alertness: Awake/alert Orientation Level: Place;Situation;Person Person: Oriented Place: Oriented Situation: Oriented Memory: Appears intact Attention: Focused;Sustained;Selective Focused Attention: Appears intact Sustained Attention: Appears intact Selective Attention: Appears intact Awareness: Appears intact Problem Solving: Appears intact Sequencing: Appears intact Organizing: Appears intact Safety/Judgment: Appears intact Brief Interview for Mental Status (BIMS) Repetition of Three Words (First Attempt): 3 Temporal Orientation: Year: Correct Temporal Orientation: Month: Accurate within 5 days Temporal Orientation: Day:  Correct Recall: "Sock": Yes, no cue required Recall: "Blue": Yes, no cue required Recall: "Bed": No, could not recall BIMS Summary Score: 13 Sensation Sensation Light Touch: Appears Intact Hot/Cold: Appears Intact Proprioception: Appears Intact Stereognosis: Appears Intact Coordination Gross Motor Movements are Fluid and Coordinated: No Fine Motor Movements are Fluid and Coordinated: Yes Coordination and Movement Description: core and proximal B UE and B LE weakness with generalized deconditioning Motor  Motor Motor: Other (comment) Motor - Skilled Clinical Observations: core and proximal B UE and B LE weakness with generalized deconditioning  Trunk/Postural Assessment  Cervical Assessment Cervical Assessment: Within Functional Limits Thoracic Assessment Thoracic Assessment: Exceptions to [facility] (thoracic rounding) Lumbar Assessment Lumbar Assessment:  (posterior pelvic tilt) Postural Control Postural Control: Deficits on evaluation Trunk Control: core weakness for high level dynaimc sitting balance tasks Postural Limitations: decreased  Balance Balance Balance Assessed: Yes Static Sitting Balance Static Sitting - Balance Support: No upper extremity supported;Feet supported Static Sitting - Level of Assistance: 5: Stand by assistance Dynamic Sitting Balance Dynamic Sitting - Balance Support: Feet supported;No upper extremity supported Dynamic Sitting - Level of Assistance: 5: Stand by assistance Static Standing Balance Static Standing - Balance Support: During functional activity;Bilateral upper extremity supported Static Standing - Level of Assistance: 5: Stand by assistance Dynamic Standing Balance Dynamic Standing - Balance Support: During functional activity Dynamic Standing - Level of Assistance: 4: Min assist;Other (comment) (CGA) Extremity/Trunk Assessment RUE Assessment RUE Assessment: Exceptions to [facility] General Strength Comments: 3-/5 shoulder flexion/abduction,  4-/5 elbow extension; WNL distally LUE Assessment LUE Assessment: Exceptions to [facility] General Strength Comments: 3-/5 shoulder flexion/abduction, 3+/5 elbow extension; WNL distally  Care Tool Care Tool Self Care Eating   Eating Assist Level: Set up assist    Oral Care    Oral Care Assist Level: Set up assist    Bathing         Assist Level: Minimal Assistance - Patient > 75%    Upper Body Dressing(including orthotics)       Assist Level: Set up assist    Lower Body Dressing (excluding footwear)     Assist for lower body dressing: Supervision/Verbal cueing    Putting on/Taking off footwear     Assist for  footwear: Supervision/Verbal cueing       Care Tool Toileting Toileting activity   Assist for toileting: Contact Guard/Touching assist     Care Tool Bed Mobility Roll left and right activity   Roll left and right assist level: Contact Guard/Touching assist    Sit to lying activity   Sit to lying assist level: Contact Guard/Touching assist    Lying to sitting on side of bed activity   Lying to sitting on side of bed assist level: the ability to move from lying on the back to sitting on the side of the bed with no back support.: Contact Guard/Touching assist     Care Tool Transfers Sit to stand transfer   Sit to stand assist level: Contact Guard/Touching assist    Chair/bed transfer   Chair/bed transfer assist level: Minimal Assistance - Patient > 75%     Toilet transfer   Assist Level: Contact Guard/Touching assist     Care Tool Cognition  Expression of Ideas and Wants Expression of Ideas and Wants: 4. Without difficulty (complex and basic) - expresses complex messages without difficulty and with speech that is clear and easy to understand  Understanding Verbal and Non-Verbal Content Understanding Verbal and Non-Verbal Content: 4. Understands (complex and basic) - clear comprehension without cues or repetitions   Memory/Recall Ability Memory/Recall Ability  : Current season;Location of own room;Staff names and faces;That he or she is in a hospital/hospital unit   Refer to Care Plan for [place] 1 OT Short Term Goal 1 (Week 1): STGs=LTGs  Recommendations for other services: None    Skilled Therapeutic Intervention ADL ADL Grooming: Supervision/safety Where Assessed-Grooming: Standing at sink Upper Body Dressing: Setup Where Assessed-Upper Body Dressing: Edge of bed Lower Body Dressing: Supervision/safety Where Assessed-Lower Body Dressing: Edge of bed Mobility  Bed Mobility Bed Mobility: Supine to Sit;Sit to Supine Supine to Sit: Contact Guard/Touching assist Sit to Supine: Contact Guard/Touching assist Transfers Sit to Stand: Contact Guard/Touching assist Stand to Sit: Contact Guard/Touching assist   Discharge Criteria: Patient will be discharged from OT if patient refuses treatment 3 consecutive times without medical reason, if treatment goals not met, if there is a change in medical status, if patient makes no progress towards goals or if patient is discharged from hospital.  The above assessment, treatment plan, treatment alternatives and goals were discussed and mutually agreed upon: by patient  Ezekiel Slocumb 02/09/2022, 2:24 PM

## 2022-02-09 NOTE — Plan of Care (Signed)
?  Problem: Consults ?Goal: RH STROKE PATIENT EDUCATION ?Description: See Patient Education module for education specifics  ?Outcome: Progressing ?  ?Problem: RH SAFETY ?Goal: RH STG ADHERE TO SAFETY PRECAUTIONS W/ASSISTANCE/DEVICE ?Description: STG Adhere to Safety Precautions With cues  Assistance/Device. ?Outcome: Progressing ?  ?Problem: RH KNOWLEDGE DEFICIT ?Goal: RH STG INCREASE KNOWLEDGE OF DIABETES ?Description: Patient will be able to manage DM with medications and dietary modifications using handouts and educational resources independently ?Outcome: Progressing ?Goal: RH STG INCREASE KNOWLEDGE OF HYPERTENSION ?Description: Patient will be able to manage HTN with medications and dietary modifications using handouts and educational resources independently ?Outcome: Progressing ?Goal: RH STG INCREASE KNOWLEGDE OF HYPERLIPIDEMIA ?Description: Patient will be able to manage HLD with medications and dietary modifications using handouts and educational resources independently ?Outcome: Progressing ?Goal: RH STG INCREASE KNOWLEDGE OF STROKE PROPHYLAXIS ?Description: Patient will be able to manage secondary stroke risks with medications and dietary modifications using handouts and educational resources independently ?Outcome: Progressing ?  ?Problem: RH KNOWLEDGE DEFICIT GENERAL ?Goal: RH STG INCREASE KNOWLEDGE OF SELF CARE AFTER HOSPITALIZATION ?Outcome: Progressing ?  ?

## 2022-02-10 ENCOUNTER — Other Ambulatory Visit: Payer: Self-pay

## 2022-02-10 LAB — GLUCOSE, CAPILLARY
Glucose-Capillary: 89 mg/dL (ref 70–99)
Glucose-Capillary: 113 mg/dL — ABNORMAL HIGH (ref 70–99)
Glucose-Capillary: 162 mg/dL — ABNORMAL HIGH (ref 70–99)
Glucose-Capillary: 190 mg/dL — ABNORMAL HIGH (ref 70–99)

## 2022-02-10 MED ORDER — INSULIN GLARGINE-YFGN 100 UNIT/ML ~~LOC~~ SOLN
17.0000 [IU] | Freq: Two times a day (BID) | SUBCUTANEOUS | Status: DC
Start: 2022-02-10 — End: 2022-02-11
  Administered 2022-02-10 (×2): 17 [IU] via SUBCUTANEOUS
  Filled 2022-02-10 (×4): qty 0.17

## 2022-02-10 MED ORDER — INSULIN ASPART 100 UNIT/ML IJ SOLN
6.0000 [IU] | Freq: Three times a day (TID) | INTRAMUSCULAR | Status: DC
  Administered 2022-02-10 – 2022-02-11 (×5): 6 [IU] via SUBCUTANEOUS

## 2022-02-10 NOTE — Progress Notes (Signed)
[facility] Individual Statement of Services  Patient Name:  [name]  Date:  02/10/2022  Welcome to the [facility].  Our goal is to provide you with an individualized program based on your diagnosis and situation, designed to meet your specific needs.  With this comprehensive rehabilitation program, you will be expected to participate in at least 3 hours of rehabilitation therapies Monday-Friday, with modified therapy programming on the weekends.  Your rehabilitation program will include the following services:  Physical Therapy (PT), Occupational Therapy (OT), Speech Therapy (ST), 24 hour per day rehabilitation nursing, Therapeutic Recreaction (TR), Neuropsychology, Care Coordinator, Rehabilitation Medicine, Nutrition Services, Pharmacy Services, and Other  Weekly team conferences will be held on Wednesdays to discuss your progress.  Your Inpatient Rehabilitation Care Coordinator will talk with you frequently to get your input and to update you on team discussions.  Team conferences with you and your family in attendance may also be held.  Expected length of stay: 5-7 Days  Overall anticipated outcome:  Supervision  Depending on your progress and recovery, your program may change. Your Inpatient Rehabilitation Care Coordinator will coordinate services and will keep you informed of any changes. Your Inpatient Rehabilitation Care Coordinator's name and contact numbers are listed  below.  The following services may also be recommended but are not provided by the [facility]:   [place] will be made to provide these services after discharge if needed.  Arrangements include referral to agencies that provide these services.  Your insurance has been verified to be:   [employer] Your primary doctor is:  Greig Right, MD  Pertinent information will  be shared with your doctor and your insurance company.  Inpatient Rehabilitation Care Coordinator:  Erlene Quan, [place] or [phone]  Information discussed with and copy given to patient by: Dyanne Iha, 02/10/2022, 11:16 AM

## 2022-02-10 NOTE — Progress Notes (Signed)
PROGRESS NOTE   Subjective/Complaints:  Labs reviewed, discussed exercise strategy, (not pushing past fatigue)   ROS- neg CP, SOB, N/V/D  Objective:   No results found. Recent Labs    02/09/22 0619  WBC 14.8*  HGB 15.7  HCT 45.4  PLT 336    Recent Labs    02/08/22 0141 02/09/22 0619  NA 137 139  K 4.4 4.0  CL 104 104  CO2 24 27  GLUCOSE 239* 98  BUN 29* 25*  CREATININE 0.57* 0.44*  CALCIUM 9.0 9.3     Intake/Output Summary (Last 24 hours) at 02/10/2022 0715 Last data filed at 02/09/2022 0846 Gross per 24 hour  Intake 240 ml  Output --  Net 240 ml         Physical Exam: Vital Signs Blood pressure 129/78, pulse 71, temperature 98.7 F (37.1 C), resp. rate 18, height '5\' 10"'$  (1.778 m), weight 80.3 kg, SpO2 95 %.   General: No acute distress Mood and affect are appropriate Heart: Regular rate and rhythm no rubs murmurs or extra sounds Lungs: Clear to auscultation, breathing unlabored, no rales or wheezes Abdomen: Positive bowel sounds, soft nontender to palpation, nondistended Extremities: No clubbing, cyanosis, or edema Skin: No evidence of breakdown, no evidence of rash Neurologic: Cranial nerves II through XII intact, motor strength is 3-/5 in bilateral deltoid, 4- bicep, tricep,5/5 grip, 3- hip flexor, 4/5 knee extensors, 5/5 ankle dorsiflexor and plantar flexor  Musculoskeletal: Full range of motion in all 4 extremities. No joint swelling  Unable to go sit to stand without pushing hands on knees  Assessment/Plan: 1. Functional deficits which require 3+ hours per day of interdisciplinary therapy in a comprehensive inpatient rehab setting. Physiatrist is providing close team supervision and 24 hour management of active medical problems listed below. Physiatrist and rehab team continue to assess barriers to discharge/monitor patient progress toward functional and medical goals  Care  Tool:  Bathing              Bathing assist Assist Level: Minimal Assistance - Patient > 75%     Upper Body Dressing/Undressing Upper body dressing   What is the patient wearing?: Hospital gown only    Upper body assist Assist Level: Set up assist    Lower Body Dressing/Undressing Lower body dressing            Lower body assist Assist for lower body dressing: Supervision/Verbal cueing     Toileting Toileting    Toileting assist Assist for toileting: Contact Guard/Touching assist     Transfers Chair/bed transfer  Transfers assist     Chair/bed transfer assist level: Minimal Assistance - Patient > 75%     Locomotion Ambulation   Ambulation assist      Assist level: Minimal Assistance - Patient > 75% Assistive device: No Device Max distance: 221f   Walk 10 feet activity   Assist     Assist level: Minimal Assistance - Patient > 75% Assistive device: No Device   Walk 50 feet activity   Assist    Assist level: Minimal Assistance - Patient > 75% Assistive device: No Device    Walk 150 feet activity   Assist  Assist level: Minimal Assistance - Patient > 75% Assistive device: No Device    Walk 10 feet on uneven surface  activity   Assist     Assist level: Minimal Assistance - Patient > 75% Assistive device: Other (comment) (railing)   Wheelchair     Assist Is the patient using a wheelchair?: No             Wheelchair 50 feet with 2 turns activity    Assist            Wheelchair 150 feet activity     Assist          Blood pressure 129/78, pulse 71, temperature 98.7 F (37.1 C), resp. rate 18, height '5\' 10"'$  (1.778 m), weight 80.3 kg, SpO2 95 %.    Medical Problem List and Plan: 1. Functional deficits secondary Inflammatory myositis             -Statin induced myopathy also in differential             -patient may  shower             -ELOS/Goals: 5-7 days             -Admit to CIR 2.   Antithrombotics: -DVT/anticoagulation:  Pharmaceutical: Lovenox             -antiplatelet therapy: ASA/Brilinta 3. Pain Management: Tylenol prn.  4. Mood: LCSW to follow for evaluation and support.              -antipsychotic agents: N/A 5. Neuropsych: This patient is capable of making decisions on his own behalf. 6. Skin/Wound Care: Routine pressure relief measures.  7. Fluids/Electrolytes/Nutrition: Monitor I/O. Check CMET/CK in am. 8.  Inflammatory myositis v/s statin induced myositis:CK stabilizing at 2000 no muscle pain              -Outpatient muscle biopsy in about 3-4 months             -Prednisone '80mg'$  PO qd 9. CAD s/p DES to RCA: Monitor for symptoms with increase in activity.              --on ASA, Brilinta, coreg and lisinopril --was to start Repatha PTA.               -Denies Chest pain 10. T2DM: Hgb A1c-7.4. Monitor BS ac/hs and use SSI for elevated BS.  --Was on metformin 1000 mg bid, Lantus 30 u/hs and Actos 30 mg PTA.              --Blood sugars poorly controlled likely due to solumedrol--now on 80 mg daily --Anticipate improvement with increase in activity and question taper.             --Continue insulin glargine bid and resume metformin for better control.  CBG (last 3)  Recent Labs    02/09/22 1754 02/09/22 2104 02/10/22 0536  GLUCAP 126* 237* 89    Expect improvement with steroid taper, reduce semglee due to early am CBG drop , increase novalog to 6U and monitor effect  11. HTN: Monitor BP TID--continue Norvasc, Lisinopril and coreg.             Overall well controlled, continue to monitor 12.  Pre-renal azotemia: Encourage fluid intake.  --Recheck BUN/SCr improving  13. Abnormal LFTs: Resolving. Recheck in am.  14. Hypokalemia: Mg- 2.1 and WNL. Likely due to steroids.  --Continue Kdur 40 meq daily for supplement.  15.  Right pontine infarct  noted on MRI- no neuro findings   16.  Leukocytosis due to steroids afebrile  LOS: 2 days A FACE TO FACE  EVALUATION WAS PERFORMED  Charlett Blake 02/10/2022, 7:15 AM

## 2022-02-10 NOTE — Progress Notes (Signed)
Patient ID: [name], male   DOB: [birth year], 66 y.o.   MRN: [id number] Team Conference Report to Patient/Family  Team Conference discussion was reviewed with the patient and caregiver, including goals, any changes in plan of care and target discharge date.  Patient and caregiver express understanding and are in agreement.  The patient has a target discharge date of 02/16/22 (short LOS; evals pending).  Sw met with patient and introduced self, explained role and addressed questions and concerns. Patient has an anticipated discharge date of 5/24.   Dyanne Iha 02/10/2022, 12:19 PM

## 2022-02-10 NOTE — Progress Notes (Signed)
Occupational Therapy Session Note  Patient Details  Name: [name] MRN: [id number] Date of Birth: March 25, 1956  Today's Date: 02/10/2022 OT Individual Time: 1005-1103 OT Individual Time Calculation (min): 58 min    Short Term Goals: Week 1:  OT Short Term Goal 1 (Week 1): STGs=LTGs  Skilled Therapeutic Interventions/Progress Updates:    Patient received seated in recliner. Patient eager to take a shower and wash hair.  Patient able to walk to bathroom without device with contact guard assistance.  Patient able to wash self with the exception of hair with only set up and supervision.  Patient lacks end range of motion RUE shoulder flex/abd (h/o rotator cuff tear) has better range of motion in L shoulder - although both shoulders cannot sustain activity overhead with good alignment due to significant proximal weakness.   Worked on guided movement for overhead reach to improve mechanics of reach and to increase scapular stability.  Able to tolerate slight resistance to overhead reach (guided movement in LUE)   Demonstrated two exercises patient could safely perform with red theraband - bicep curl, and shoulder flexion with elbow extension "punch"   Patient left up in recliner with alarm pad engaged, and all personal items in reach.    Therapy Documentation Precautions:  Precautions Precautions: Fall, Other (comment) Precaution Comments: proximal muscle weakness Restrictions Weight Bearing Restrictions: No    Pain: Pain Assessment Pain Scale: 0-10 Pain Score: 0-No pain   Therapy/Group: Individual Therapy  Mariah Milling 02/10/2022, 12:42 PM

## 2022-02-10 NOTE — Progress Notes (Signed)
Physical Therapy Session Note  Patient Details  Name: [name] MRN: [id number] Date of Birth: [birth year]  Today's Date: 02/10/2022 PT Individual Time: [phone] and 2951-8841 PT Individual Time Calculation (min): 69 min and 60 min  Short Term Goals: Week 1:  PT Short Term Goal 1 (Week 1): = to LTGs based on ELOS  Skilled Therapeutic Interventions/Progress Updates:    Session 1: Pt received sitting in recliner and agreeable to therapy session. Sit<>stands requiring use of pushing up from armrests to come to standing (pt states earlier with MD he was able to stand up by just pushing up on his knees but now he can't do it). Gait in room and bathroom using RW with CGA for safety. Performed standing oral care at sink with close supervision for safety. Standing in bathroom with supervision for safety completed LB clothing management without assist and continent of bladder.   Gait training ~147f to main therapy gym using RW with CGA for safety - continues to achieve reciprocal stepping pattern with slower gait speed and slight truncal/pelvic weakness noted but pt able to use UE support on RW to compensate.   Five times Sit to Stand Test (FTSS) Method: Use a straight back chair with a solid seat that is 16-18" high. Ask participant to sit on the chair with arms folded across their chest.   Instructions: "Stand up and sit down as quickly as possible 5 times, keeping your arms folded across your chest."   Measurement: Stop timing when the participant stands the 5th time.  TIME: 32 seconds - pt having to push up from his knees in order to come to standing without physical assistance, only close supervision provided for safety  Times > 13.6 seconds is associated with increased disability and morbidity (Guralnik, 2000) Times > 15 seconds is predictive of recurrent falls in healthy individuals aged 656and older (Buatois, et al., 2008) Normal performance values in community dwelling individuals  aged 631and older (Bohannon, 2006): 60-69 years: 11.4 seconds 70-79 years: 12.6 seconds 80-89 years: 14.8 seconds  MCID: ? 2.3 seconds for Vestibular Disorders (Meretta, 2006)   Gait speed using RW 0.762m vs without RW 0.9158mwith pt requiring CGA for safety during each walk - pt does demonstrate increased postural instability and mild path deviation without AD increasing pt's risk for fall. Pt's gait speed is indicative of a limited community ambulator requiring additional intervention to reduce fall risk.   Gait training, no AD, ~150f76frward with CGA progressed to side stepping ~30ft66fh direction x2 working on hip abductor strengthening with continued CGA for steadying/safety - increased weakness when stepping towards L compared to R which is consistent with strength seen during exercises yesterday.  Gait training backwards 30ft 68fno UE support, with CGA/light min assist for steadying balance - pt gradually more comfortable and confident with the movement resulting in larger step lengths and faster walking.  B LE strengthening and dynamic balance via stepping up/down on/off 1st 6" step using B HR support with CGA/light min assist for steadying safety - x12 reps per LE without seated rest and alternating lead LE - slight instability on L compared to R.  Dynamic balance via forward/backwards stepping over hockey stick without UE support with min assist for balance - reports greatest difficulty with stepping back leading with R LE and easiest is stepping forward with R LE.   Dynamic balance via lateral side stepping over hockey stick without UE support - CGA/light min assist for balance and  pt with much improvement in balance after cuing to maintain trunk upright and keep hips extended.   Dynamic balance with dual-task of stepping over hockey stick either forward/backwards or laterally while collecting clothespins and reaching overhead to place them on basketball goal targeting  BUE  shoulder strengthening - demos more weakness in L UE compared to R - CGA/light min assist for balance throughout.   Gait training back to room as described above. Pt left seated in recliner with needs in reach and chair alarm on.    Session 2: Pt received sitting in recliner and agreeable to therapy session with plan to progress to outdoor mobility. Stand pivot to w/c, no AD, with CGA for safety/steadying. Nurse notified and pt transported outside in w/c. Sit<>stands using RW with close supervision during session - continues to require pushing up from knees or armrests to come to stand due to B LE weakness.   Gait training >266f outside using RW with CGA while navigating up/down ramps and traversing brick pathways - pt demos good AD management as well as good stability during gait while maintaining reciprocal stepping and sustaining adequate gait speed.  Progressed to navigating up/down 8 outside steps using R UE support on HR each direction with pt self-selecting reciprocal pattern - CGA/light min assist for safety during this as pt demos increasing difficulty powering up onto the step during ascent due to B LE weakness.  Discussed pt's active life and recommended trying rollator compared to RW to allow increased access to community while providing ability for seated rest breaks when fatigued.   Transported back up to CIR. Therapist provided pt with rollator and adjusted height for pt fit. Educated pt on safe use of rollator brakes and seat. Gait training ~1460fusing rollator with pt demonstrating good AD management and no more instability than when using RW - pt reports he likes this AD but is still concerned about the bulkiness of it and managing it in the community - therapist educated on increased safety with this device compared to a cane but will continue to assess the most appropriate LRAD.  Repeated sit<>stands to/from EOM at 24in height x8 reps advanced to 22in height 2x6 reps with  CGA/light min assist and cuing for increased anterior trunk lean.  Gait training back to room using rollator with CGA for safety but progressing towards supervision. Gait in/out bathroom using rollator - requires cuing to use rollator for this as opposed to leaving it out in the room. Standing with distant supervision performed LB clothing management without assist and continent of urine. At end of session, pt left seated in recliner with needs in reach and chair alarm on.   Therapy Documentation Precautions:  Precautions Precautions: Fall, Other (comment) Precaution Comments: proximal muscle weakness Restrictions Weight Bearing Restrictions: No   Pain:  Session 1: Denies pain during session.  Session 2: Denies pain during session.    Therapy/Group: Individual Therapy  CaTawana Scale PT, DPT, NCS, CSRS 02/10/2022, 8:00 AM

## 2022-02-11 ENCOUNTER — Ambulatory Visit: Payer: Medicare Other | Admitting: Nurse Practitioner

## 2022-02-11 LAB — GLUCOSE, CAPILLARY
Glucose-Capillary: 55 mg/dL — ABNORMAL LOW (ref 70–99)
Glucose-Capillary: 71 mg/dL (ref 70–99)
Glucose-Capillary: 125 mg/dL — ABNORMAL HIGH (ref 70–99)
Glucose-Capillary: 266 mg/dL — ABNORMAL HIGH (ref 70–99)
Glucose-Capillary: 257 mg/dL — ABNORMAL HIGH (ref 70–99)

## 2022-02-11 MED ORDER — INSULIN GLARGINE-YFGN 100 UNIT/ML ~~LOC~~ SOLN
15.0000 [IU] | Freq: Two times a day (BID) | SUBCUTANEOUS | Status: DC
  Administered 2022-02-11 – 2022-02-14 (×8): 15 [IU] via SUBCUTANEOUS
  Filled 2022-02-11 (×10): qty 0.15

## 2022-02-11 NOTE — Progress Notes (Signed)
PROGRESS NOTE   Subjective/Complaints:  Labs reviewed, discussed exercise strategy, (not pushing past fatigue)   ROS- neg CP, SOB, N/V/D  Objective:   No results found. Recent Labs    02/09/22 0619  WBC 14.8*  HGB 15.7  HCT 45.4  PLT 336    Recent Labs    02/09/22 0619  NA 139  K 4.0  CL 104  CO2 27  GLUCOSE 98  BUN 25*  CREATININE 0.44*  CALCIUM 9.3     Intake/Output Summary (Last 24 hours) at 02/11/2022 0742 Last data filed at 02/11/2022 0729 Gross per 24 hour  Intake 697 ml  Output 150 ml  Net 547 ml         Physical Exam: Vital Signs Blood pressure (!) 143/70, pulse 71, temperature 97.7 F (36.5 C), resp. rate 18, height '5\' 10"'$  (1.778 m), weight 80.3 kg, SpO2 96 %.   General: No acute distress Mood and affect are appropriate Heart: Regular rate and rhythm no rubs murmurs or extra sounds Lungs: Clear to auscultation, breathing unlabored, no rales or wheezes Abdomen: Positive bowel sounds, soft nontender to palpation, nondistended Extremities: No clubbing, cyanosis, or edema Skin: No evidence of breakdown, no evidence of rash Neurologic: Cranial nerves II through XII intact, motor strength is 3-/5 in bilateral deltoid, 4- bicep, tricep,5/5 grip, 3- hip flexor, 4/5 knee extensors, 5/5 ankle dorsiflexor and plantar flexor  Musculoskeletal: Full range of motion in all 4 extremities. No joint swelling  Unable to go sit to stand without pushing hands on knees  Assessment/Plan: 1. Functional deficits which require 3+ hours per day of interdisciplinary therapy in a comprehensive inpatient rehab setting. Physiatrist is providing close team supervision and 24 hour management of active medical problems listed below. Physiatrist and rehab team continue to assess barriers to discharge/monitor patient progress toward functional and medical goals  Care Tool:  Bathing    Body parts bathed by patient:  Right arm, Left arm, Chest, Abdomen, Front perineal area, Buttocks, Right upper leg, Left upper leg, Right lower leg, Left lower leg, Face   Body parts bathed by helper:  (asisted to wash hair)     Bathing assist Assist Level: Minimal Assistance - Patient > 75%     Upper Body Dressing/Undressing Upper body dressing   What is the patient wearing?: Pull over shirt    Upper body assist Assist Level: Set up assist    Lower Body Dressing/Undressing Lower body dressing      What is the patient wearing?: Pants, Underwear/pull up     Lower body assist Assist for lower body dressing: Minimal Assistance - Patient > 75%     Toileting Toileting    Toileting assist Assist for toileting: Contact Guard/Touching assist     Transfers Chair/bed transfer  Transfers assist     Chair/bed transfer assist level: Contact Guard/Touching assist Chair/bed transfer assistive device: Programmer, multimedia   Ambulation assist      Assist level: Contact Guard/Touching assist Assistive device: Walker-rolling Max distance: 269f   Walk 10 feet activity   Assist     Assist level: Contact Guard/Touching assist Assistive device: Walker-rolling   Walk 50 feet activity  Assist    Assist level: Contact Guard/Touching assist Assistive device: Walker-rolling    Walk 150 feet activity   Assist    Assist level: Contact Guard/Touching assist Assistive device: Walker-rolling    Walk 10 feet on uneven surface  activity   Assist     Assist level: Minimal Assistance - Patient > 75% Assistive device: Other (comment) (railing)   Wheelchair     Assist Is the patient using a wheelchair?: No             Wheelchair 50 feet with 2 turns activity    Assist            Wheelchair 150 feet activity     Assist          Blood pressure (!) 143/70, pulse 71, temperature 97.7 F (36.5 C), resp. rate 18, height '5\' 10"'$  (1.778 m), weight 80.3 kg,  SpO2 96 %.    Medical Problem List and Plan: 1. Functional deficits secondary Inflammatory myositis             -Statin induced myopathy also in differential             -patient may  shower             -ELOS/Goals: 5-7 days             -Admit to CIR 2.  Antithrombotics: -DVT/anticoagulation:  Pharmaceutical: Lovenox             -antiplatelet therapy: ASA/Brilinta 3. Pain Management: Tylenol prn.  4. Mood: LCSW to follow for evaluation and support.              -antipsychotic agents: N/A 5. Neuropsych: This patient is capable of making decisions on his own behalf. 6. Skin/Wound Care: Routine pressure relief measures.  7. Fluids/Electrolytes/Nutrition: Monitor I/O. Check CMET/CK in am.    Latest Ref Rng & Units 02/09/2022    6:19 AM 02/08/2022    1:41 AM 02/07/2022    1:52 AM  BMP  Glucose 70 - 99 mg/dL 98   239   221    BUN 8 - 23 mg/dL '25   29   29    '$ Creatinine 0.61 - 1.24 mg/dL 0.44   0.57   0.51    Sodium 135 - 145 mmol/L 139   137   139    Potassium 3.5 - 5.1 mmol/L 4.0   4.4   4.2    Chloride 98 - 111 mmol/L 104   104   107    CO2 22 - 32 mmol/L '27   24   25    '$ Calcium 8.9 - 10.3 mg/dL 9.3   9.0   9.2      8.  Inflammatory myositis v/s statin induced myositis:CK stabilizing at 2000 no muscle pain or renal abnormalities              -Outpatient muscle biopsy in about 3-4 months             -Prednisone '80mg'$  PO qd 9. CAD s/p DES to RCA: Monitor for symptoms with increase in activity.              --on ASA, Brilinta, coreg and lisinopril --was to start Repatha PTA.               -Denies Chest pain 10. T2DM: Hgb A1c-7.4. Monitor BS ac/hs and use SSI for elevated BS.  --Was on metformin 1000 mg bid, Lantus 30 u/hs and Actos  30 mg PTA.              --Blood sugars poorly controlled likely due to solumedrol--now on 80 mg daily --Anticipate improvement with increase in activity and question taper.             --Continue insulin glargine bid and resume metformin for better  control.  CBG (last 3)  Recent Labs    02/10/22 2034 02/11/22 0540 02/11/22 0640  GLUCAP 190* 55* 71   Low CBG this am  Expect improvement with steroid taper, reduce semglee to 15 U BID due to early am CBG drop , increase novalog to 6U and monitor effect  11. HTN: Monitor BP TID--continue Norvasc, Lisinopril and coreg.             Overall well controlled, continue to monitor 12.  Pre-renal azotemia: Encourage fluid intake.  --Recheck BUN/SCr improving  13. Abnormal LFTs: Resolving. Recheck in am.  14. Hypokalemia: Mg- 2.1 and WNL. Likely due to steroids.  --Continue Kdur 40 meq daily for supplement.  15.  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  Leukocytosis due to steroids afebrile  LOS: 3 days A FACE TO FACE EVALUATION WAS PERFORMED  Charlett Blake 02/11/2022, 7:42 AM

## 2022-02-11 NOTE — Progress Notes (Signed)
Occupational Therapy Session Note  Patient Details  Name: [name] MRN: [id number] Date of Birth: 1955-11-08  Today's Date: 02/11/2022 OT Individual Time: 4098-1191 session 1 OT Individual Time Calculation (min): 70 min  Session 2: 4782-9562 ( 74 mins)   Short Term Goals: Week 1:  OT Short Term Goal 1 (Week 1): STGs=LTGs  Skilled Therapeutic Interventions/Progress Updates:  Session 1: Pt greeted seated in recliner  agreeable to OT intervention. Session focus on BADL reeducation, Dc planning, DME training, functional mobility, dynamic standing balance and decreasing overall caregiver burden.       Pt completed functional ambulation in room with rollator with supervision to brush teeth at sink. Pt completed all functional ambulation during session with rollator and supervision.  Pt completed functional mobility in apt to couch and TTB in tub shower with supervision, decided on TTB for showr with plan for pt to install grab bars and removable shower head. Education provided on energy conservation for home mgmt tasks, pt able to complete IADL task of reaching into cabinets to collect IADL items with supervision, even able to reach [facility] to collect items with no issues despite rotator cuff tear. Pt also completed standing dynamic balance task on airex cushion to challenge dynamic balance during IADLs with overall supervision. Education provided on fall prevention strategies as pt reports falling multiple times prior to admission. Pt completed light AAROM therex using hula hoop to facilitate improved Active shoulder internal rotation. Pt completed ~ 20 reps, additionally issued pt light BUE HEP to be practiced during second session with written HEP provided.  pt left seated in recliner with alarm belt activated and all needs within reach                     Session 2: Pt greeted seated in recliner agreeable to OT intervention. Started session by    Reviewing BUE therex from this AM pt completed therex  as indicated below with level 3 theraband: X10 bicep curls X10 shoulder horizontal ABD ( more active assist than resistance d/t pain) X10 shoulder extension  X10 alternating punches forward and below chest height X10 bilateral shoulder external rotation   Updated written HEP to increase carryover  Pt completed functional ambulation to toilet with rollator and supervision to complete   3/3 toileting tasks with supervision. Pt ambulated to gym with rollator and supervision. Remainder of session focus on BUE therex/strengthening:  Supine therex with 3 lb dowel rod: X10 chest presses X10 forward rows X10 backwards rows  Seated BUE therex  X20 scapular protraction/retraction with UE on side table with 2.5 lb wrist weight Ball tosses from seated EOM with 2.5 wrist weights on BUEs, ( bounce passes, "free throw shots" unable to chest press d/t pain) Pt completed functional ambulation from gym down to atrium with rollator and supervision, pt even ambulated outside across uneven surfaces and around obstacles with rollator and supervision, no LOB noted to increase overall activity tolerance and challenge dynamic balance to increase safety with ADL transfers. Pt seated in recliner with all needs within reach.    Therapy Documentation Precautions:  Precautions Precautions: Fall, Other (comment) Precaution Comments: proximal muscle weakness Restrictions Weight Bearing Restrictions: No  Pain: Session 1: unrated pain reported in RUE, rest breaks and stretches provided as needed Session 2: unrated pain reported in RUE, rest breaks and stretches provided as needed    Therapy/Group: Individual Therapy  Corinne Ports [facility] 02/11/2022, 9:15 AM

## 2022-02-11 NOTE — Progress Notes (Signed)
Physical Therapy Session Note  Patient Details  Name: [name] MRN: [id number] Date of Birth: 07-Jul-1956  Today's Date: 02/11/2022 PT Individual Time: 0920-1015 PT Individual Time Calculation (min): 55 min   Short Term Goals: Week 1:  PT Short Term Goal 1 (Week 1): = to LTGs based on ELOS  Skilled Therapeutic Interventions/Progress Updates:   ,Pt received sitting in WC and agreeable to PT  Gait training with rollator x 162ft, 182ft and 272ft supervision assist from PT wth cues for Rollator parts management. Gait without AD x 443ft in rehab gym with superivcsion assist-CGA.   Dynamic balance training on biodex: AP control x 1 min each. LOS low difficulty ( 49sec and 45%)and medium difficulty(53sec and 48%), random control low difficulty(2 min 95%) hight difficulty (2 min 83%) and wii fit balance table tilt and penguin slide x 1 each. Pt performed all balance training without UE support and cues for ankle strategy to improve COM control. Therapeutic rest breaks between bouts to prevent fatigue.    Patient returned to room and performed stand pivot to recliner with supervision assist and rollator. Pt left sitting in recliner with call bell in reach and all needs met.        Therapy Documentation Precautions:  Precautions Precautions: Fall, Other (comment) Precaution Comments: proximal muscle weakness Restrictions Weight Bearing Restrictions: No  Pain:  denies   Therapy/Group: Individual Therapy  Lorie Phenix 02/11/2022, 10:18 AM

## 2022-02-11 NOTE — IPOC Note (Signed)
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[facility])  Hospital Problems: Principal Problem:   Polymyositis ([place])     Functional Problem List: Nursing Endurance, Pain  PT Balance, Safety, Edema, Sensory, Endurance, Skin Integrity, Motor, Nutrition, Pain  OT Balance, Endurance, Motor  SLP    TR         Basic ADL's: OT Grooming, Dressing, Bathing, Toileting     Advanced  ADL's: OT       Transfers: PT Bed Mobility, Bed to Chair, Car, [employer], Floor  OT Toilet, Tub/Shower     Locomotion: PT Ambulation, Stairs     Additional Impairments: OT Fuctional Use of Upper Extremity  SLP        TR      Anticipated Outcomes Item Anticipated Outcome  Self Feeding    Swallowing      Basic self-care  mod I-sup  Toileting  mod I   Bathroom Transfers mod I  Bowel/Bladder  remain continent of bowel and bladder  Transfers  mod-I using LRAD  Locomotion  supervision using LRAD  Communication     Cognition     Pain  pain wil be equal to or less than 4/10 with min assist  Safety/Judgment  patient will be free from falls/injury and displaying appropriate safety decisions with min assist   Therapy Plan: PT Intensity: Minimum of 1-2 x/day ,45 to 90 minutes PT Frequency: 5 out of 7 days PT Duration Estimated Length of Stay: ~7-10 days OT Intensity: Minimum of 1-2 x/day, 45 to 90 minutes OT Frequency: 5 out of 7 days OT Duration/Estimated Length of Stay: 7 days     Team Interventions: Nursing Interventions Pain Management, Medication Management, Discharge Planning  PT interventions Ambulation/gait training, Community reintegration, DME/adaptive equipment instruction, Neuromuscular re-education, Psychosocial support, Stair training, UE/LE Strength taining/ROM, Training and development officer, Discharge planning, Pain management, Functional electrical stimulation, Skin care/wound management,  Therapeutic Activities, UE/LE Coordination activities, Disease management/prevention, Functional mobility training, Patient/family education, Splinting/orthotics, Therapeutic Exercise  OT Interventions Balance/vestibular training, Discharge planning, Functional electrical stimulation, Pain management, Therapeutic Activities, Self Care/advanced ADL retraining, UE/LE Coordination activities, Disease mangement/prevention, Functional mobility training, Patient/family education, Therapeutic Exercise, Community reintegration, Engineer, drilling, Neuromuscular re-education, Psychosocial support, UE/LE Strength taining/ROM  SLP Interventions    TR Interventions    SW/CM Interventions Discharge Planning, Psychosocial Support, Disease Management/Prevention   Barriers to Discharge MD  Medical stability  Nursing Decreased caregiver support 1 level 1 ste no rails solo, father can provide supervision, dtr will check on him  PT Decreased caregiver support    OT      SLP      SW Insurance for SNF coverage     Team Discharge Planning: Destination: PT-Home ,OT- Home , SLP-  Projected Follow-up: PT-24 hour supervision/assistance, Outpatient PT, OT-  24 hour supervision/assistance, Outpatient OT, SLP-  Projected Equipment Needs: PT-To be determined, OT- To be determined, SLP-  Equipment Details: PT-pt only has cane, OT-  Patient/family involved in discharge planning: PT- Patient,  OT-Patient, SLP-   MD ELOS: 7d Medical Rehab Prognosis:  Good Assessment: The patient has been admitted for CIR therapies with the diagnosis of polymyositis. The team will be addressing functional mobility, strength, stamina, balance, safety, adaptive techniques and equipment, self-care, bowel and bladder mgt, patient and caregiver education, diabetic management. Goals have been set at mod I. Anticipated discharge destination is Home .        See Team Conference Notes  for weekly updates to the plan of  care

## 2022-02-11 NOTE — Progress Notes (Signed)
Patient ID: [name], male   DOB: 28-May-1956, 66 y.o.   MRN: [id number]  OP referral faxed to neuro rehab

## 2022-02-12 LAB — GLUCOSE, CAPILLARY
Glucose-Capillary: 68 mg/dL — ABNORMAL LOW (ref 70–99)
Glucose-Capillary: 75 mg/dL (ref 70–99)
Glucose-Capillary: 134 mg/dL — ABNORMAL HIGH (ref 70–99)
Glucose-Capillary: 243 mg/dL — ABNORMAL HIGH (ref 70–99)
Glucose-Capillary: 193 mg/dL — ABNORMAL HIGH (ref 70–99)

## 2022-02-12 MED ORDER — INSULIN ASPART 100 UNIT/ML IJ SOLN
5.0000 [IU] | Freq: Three times a day (TID) | INTRAMUSCULAR | Status: DC
  Administered 2022-02-12 – 2022-02-14 (×7): 5 [IU] via SUBCUTANEOUS

## 2022-02-12 NOTE — Plan of Care (Signed)
  Problem: Consults Goal: RH STROKE PATIENT EDUCATION Description: See Patient Education module for education specifics  Outcome: Progressing   Problem: RH SAFETY Goal: RH STG ADHERE TO SAFETY PRECAUTIONS W/ASSISTANCE/DEVICE Description: STG Adhere to Safety Precautions With cues  Assistance/Device. Outcome: Progressing   Problem: RH KNOWLEDGE DEFICIT Goal: RH STG INCREASE KNOWLEDGE OF DIABETES Description: Patient will be able to manage DM with medications and dietary modifications using handouts and educational resources independently Outcome: Progressing Goal: RH STG INCREASE KNOWLEDGE OF HYPERTENSION Description: Patient will be able to manage HTN with medications and dietary modifications using handouts and educational resources independently Outcome: Progressing Goal: RH STG INCREASE KNOWLEGDE OF HYPERLIPIDEMIA Description: Patient will be able to manage HLD with medications and dietary modifications using handouts and educational resources independently Outcome: Progressing Goal: RH STG INCREASE KNOWLEDGE OF STROKE PROPHYLAXIS Description: Patient will be able to manage secondary stroke risks with medications and dietary modifications using handouts and educational resources independently Outcome: Progressing   Problem: RH KNOWLEDGE DEFICIT GENERAL Goal: RH STG INCREASE KNOWLEDGE OF SELF CARE AFTER HOSPITALIZATION Outcome: Progressing

## 2022-02-12 NOTE — Progress Notes (Signed)
PROGRESS NOTE   Subjective/Complaints:  Patient seen in chair today.  In good spirit, asked if he is symptomatic with hypoglycemic events.  Says he is asleep, so doesn't know.   ROS- neg CP, SOB, N/V/D  Objective:   No results found. No results for input(s): WBC, HGB, HCT, PLT in the last 72 hours. No results for input(s): NA, K, CL, CO2, GLUCOSE, BUN, CREATININE, CALCIUM in the last 72 hours.  Intake/Output Summary (Last 24 hours) at 02/12/2022 1532 Last data filed at 02/12/2022 0811 Gross per 24 hour  Intake 717 ml  Output --  Net 717 ml        Physical Exam: Vital Signs Blood pressure (!) 157/70, pulse 90, temperature 98.5 F (36.9 C), temperature source Oral, resp. rate 18, height '5\' 10"'$  (1.778 m), weight 80.2 kg, SpO2 98 %.   General: No acute distress Mood and affect are appropriate Heart: Regular rate and rhythm no rubs murmurs or extra sounds Lungs: Clear to auscultation, breathing unlabored, no rales or wheezes Abdomen: Positive bowel sounds, soft nontender to palpation, nondistended Extremities: No clubbing, cyanosis, or edema Skin: No evidence of breakdown, no evidence of rash Neurologic: Cranial nerves II through XII intact, motor strength is 3-/5 in bilateral deltoid, 4- bicep, tricep,5/5 grip, 3- hip flexor, 4/5 knee extensors, 5/5 ankle dorsiflexor and plantar flexor Musculoskeletal: Full range of motion in all 4 extremities. No joint swelling. Needs to push on hands to go from sitting to standing.  Assessment/Plan: 1. Functional deficits which require 3+ hours per day of interdisciplinary therapy in a comprehensive inpatient rehab setting. Physiatrist is providing close team supervision and 24 hour management of active medical problems listed below. Physiatrist and rehab team continue to assess barriers to discharge/monitor patient progress toward functional and medical goals  Care Tool:  Bathing     Body parts bathed by patient: Right arm, Left arm, Chest, Abdomen, Front perineal area, Buttocks, Right upper leg, Left upper leg, Right lower leg, Left lower leg, Face   Body parts bathed by helper:  (asisted to wash hair)     Bathing assist Assist Level: Minimal Assistance - Patient > 75%     Upper Body Dressing/Undressing Upper body dressing   What is the patient wearing?: Pull over shirt    Upper body assist Assist Level: Set up assist    Lower Body Dressing/Undressing Lower body dressing      What is the patient wearing?: Pants, Underwear/pull up     Lower body assist Assist for lower body dressing: Minimal Assistance - Patient > 75%     Toileting Toileting    Toileting assist Assist for toileting: Supervision/Verbal cueing     Transfers Chair/bed transfer  Transfers assist     Chair/bed transfer assist level: Supervision/Verbal cueing Chair/bed transfer assistive device: Programmer, multimedia   Ambulation assist      Assist level: Contact Guard/Touching assist Assistive device: Walker-rolling Max distance: 261f   Walk 10 feet activity   Assist     Assist level: Contact Guard/Touching assist Assistive device: Walker-rolling   Walk 50 feet activity   Assist    Assist level: Contact Guard/Touching assist Assistive device:  Walker-rolling    Walk 150 feet activity   Assist    Assist level: Contact Guard/Touching assist Assistive device: Walker-rolling    Walk 10 feet on uneven surface  activity   Assist     Assist level: Minimal Assistance - Patient > 75% Assistive device: Other (comment) (railing)   Wheelchair     Assist Is the patient using a wheelchair?: No             Wheelchair 50 feet with 2 turns activity    Assist            Wheelchair 150 feet activity     Assist          Blood pressure (!) 157/70, pulse 90, temperature 98.5 F (36.9 C), temperature source Oral, resp.  rate 18, height '5\' 10"'$  (1.778 m), weight 80.2 kg, SpO2 98 %.    Medical Problem List and Plan: 1. Functional deficits secondary Inflammatory myositis             -Statin induced myopathy also in differential             -patient may  shower             -ELOS/Goals: 5-7 days             -Admit to CIR 2.  Antithrombotics: -DVT/anticoagulation:  Pharmaceutical: Lovenox             -antiplatelet therapy: ASA/Brilinta 3. Pain Management: Tylenol prn.  4. Mood: LCSW to follow for evaluation and support.              -antipsychotic agents: N/A 5. Neuropsych: This patient is capable of making decisions on his own behalf. 6. Skin/Wound Care: Routine pressure relief measures.  7. Fluids/Electrolytes/Nutrition: Monitor I/O. Check CMET/CK in am.    Latest Ref Rng & Units 02/09/2022    6:19 AM 02/08/2022    1:41 AM 02/07/2022    1:52 AM  BMP  Glucose 70 - 99 mg/dL 98   239   221    BUN 8 - 23 mg/dL '25   29   29    '$ Creatinine 0.61 - 1.24 mg/dL 0.44   0.57   0.51    Sodium 135 - 145 mmol/L 139   137   139    Potassium 3.5 - 5.1 mmol/L 4.0   4.4   4.2    Chloride 98 - 111 mmol/L 104   104   107    CO2 22 - 32 mmol/L '27   24   25    '$ Calcium 8.9 - 10.3 mg/dL 9.3   9.0   9.2      8.  Inflammatory myositis v/s statin induced myositis:CK stabilizing at 2000 no muscle pain or renal abnormalities              -Outpatient muscle biopsy in about 3-4 months             -Prednisone '80mg'$  PO qd 9. CAD s/p DES to RCA: Monitor for symptoms with increase in activity.              --on ASA, Brilinta, coreg and lisinopril --was to start Repatha PTA.               -Denies Chest pain 10. T2DM: Hgb A1c-7.4. Monitor BS ac/hs and use SSI for elevated BS.  --Was on metformin 1000 mg bid, Lantus 30 u/hs and Actos 30 mg PTA.              --  Blood sugars poorly controlled likely due to solumedrol--now on 80 mg daily --Anticipate improvement with increase in activity and question taper.             --Continue insulin  glargine bid and resume metformin for better control.  CBG (last 3)  Recent Labs    02/12/22 0612 02/12/22 0643 02/12/22 1150  GLUCAP 68* 75 134*  Low CBG this am  Expect improvement with steroid taper, reduce semglee to 15 U BID due to early am CBG drop , increase novalog to 6U and monitor effect  -5/20 Patient is having hypoglycemic events in the am but high CBG's late afternoon and evening. -Reduced SSI from 6U insulin aspart to 5U insulin aspart.  Will continue prednisone taper of 80 mg daily which will decrease to 70 mg daily on Tuesday 5/23.  Will trend to see if further reduction in insulin aspart is needed. 11. HTN: Monitor BP TID--continue Norvasc, Lisinopril and coreg.             Overall well controlled, continue to monitor 12.  Pre-renal azotemia: Encourage fluid intake.  --Recheck BUN/SCr improving  5/20 last BUN 25 and Crt 0.44 (5/17) continue to trend qMonday labs.    Latest Ref Rng & Units 02/09/2022    6:19 AM 02/08/2022    1:41 AM 02/07/2022    1:52 AM  BMP  Glucose 70 - 99 mg/dL 98   239   221    BUN 8 - 23 mg/dL '25   29   29    '$ Creatinine 0.61 - 1.24 mg/dL 0.44   0.57   0.51    Sodium 135 - 145 mmol/L 139   137   139    Potassium 3.5 - 5.1 mmol/L 4.0   4.4   4.2    Chloride 98 - 111 mmol/L 104   104   107    CO2 22 - 32 mmol/L '27   24   25    '$ Calcium 8.9 - 10.3 mg/dL 9.3   9.0   9.2      13. Abnormal LFTs: Resolving. Recheck in am.  14. Hypokalemia: Mg- 2.1 and WNL. Likely due to steroids.  --Continue Kdur 40 meq daily for supplement.  5/20 corrected, continue to monitor. 15.  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  Leukocytosis due to steroids afebrile   5/20 Likely reactive d/t steroids, no other s/s of infection    Latest Ref Rng & Units 02/09/2022    6:19 AM 02/05/2022    1:43 AM 02/03/2022    2:21 AM  CBC  WBC 4.0 - 10.5 K/uL 14.8   11.8   10.8    Hemoglobin 13.0 - 17.0 g/dL 15.7   13.1   12.7    Hematocrit 39.0 - 52.0 % 45.4    39.4   38.9    Platelets 150 - 400 K/uL 336   267   273       LOS: 4 days A FACE TO FACE EVALUATION WAS PERFORMED  Luetta Nutting 02/12/2022, 3:32 PM

## 2022-02-12 NOTE — Progress Notes (Signed)
Physical Therapy Session Note  Patient Details  Name: [name] MRN: [id number] Date of Birth: 1956/01/10  Today's Date: 02/12/2022 PT Individual Time: 0922-1022 PT Individual Time Calculation (min): 60 min   Short Term Goals: Week 1:  PT Short Term Goal 1 (Week 1): = to LTGs based on ELOS  Skilled Therapeutic Interventions/Progress Updates:    Pt received sitting in recliner with nurse present for morning medication. Pt agreeable to therapy session. Sit<>stands using rollator with distant supervision during session - demos good safety awareness with brakes without cuing. Gait training ~1101f to main therapy gym using rollator with supervision for safety - demos safe use of AD and pt reports he still likes this AD for use at D/C.  Gait training ~2039f no AD, with CGA for steadying as pt demos increased postural instability likely due to hip muscle weakness and fatigue from increased walking distance yesterday.   Dynamic gait training ~3031f3 of backwards walking, no AD, with CGA and intermittent light min assist for steadying due R anterior postural sway when stepping back with L LE leading due to R quad weakness.  Pt noted to have weak cough during session - recommended use of incentive spirometer.  Dynamic gait training side stepping ~70f7f using red theraband (level 2) resistance around knees - CGA for steadying - increased difficulty stepping towards L compared to R due to hip weakness.   Sit>supine on mat with cuing for pt not to compensate by using UEs to pull his legs up up onto mat, but then pt requires min assist for LE management.  Supine on mat performed modified dead bug exercises (keeping feet rested on mat in hooklying position as opposed to lifted up) x10 reps progressed to standard form of holding legs up; however, pt requires max assist to keep LEs lifted up in hip/knee 90/90 position with more difficulty lifting L LE 2x10reps- pt with increased core activation when  doing the standard form with assistance, which is the goal of the exercise.  Supine bridging with red level 2 theraband resistance around knees for increased hip abductor activation x8 reps then x12 reps with pt stopping at point of fatigue, demos quicker fatigue today compared to 1st day performing these - provided 1 minute rest between each set.   Supine>sitting R EOB, via logroll technique to increase pt independence, with min assist to initiate trunk upright due to continued weakness in core & UEs.  Sitting EOM performed B UE and core strengthening via Pallof press exercise using red level 2 theraband resistance x10 reps in each direction.  Educated pt on recommendation of follow-up OPPT.  Gait training ~150ft53fk to room using rollator with supervision - continues to maintain reciprocal stepping pattern and upright posture using this AD. Pt left seated in recliner with needs in reach.   Therapy Documentation Precautions:  Precautions Precautions: Fall, Other (comment) Precaution Comments: proximal muscle weakness Restrictions Weight Bearing Restrictions: No   Pain:  Denies pain during session.    Therapy/Group: Individual Therapy  CarlyTawana Scale, DPT, NCS, CSRS 02/12/2022, 7:53 AM

## 2022-02-12 NOTE — Progress Notes (Signed)
Hypoglycemic Event  CBG: 68  Treatment: 4 oz juice/soda  Symptoms: None  Follow-up CBG: [id number] CBG Result:75  Possible Reasons for Event: Inadequate meal intake  Comments/MD notified: continue to monitor    Mishti Swanton A

## 2022-02-13 LAB — GLUCOSE, CAPILLARY
Glucose-Capillary: 79 mg/dL (ref 70–99)
Glucose-Capillary: 82 mg/dL (ref 70–99)
Glucose-Capillary: 134 mg/dL — ABNORMAL HIGH (ref 70–99)
Glucose-Capillary: 193 mg/dL — ABNORMAL HIGH (ref 70–99)
Glucose-Capillary: 221 mg/dL — ABNORMAL HIGH (ref 70–99)

## 2022-02-13 NOTE — Plan of Care (Signed)
  Problem: Consults Goal: RH STROKE PATIENT EDUCATION Description: See Patient Education module for education specifics  Outcome: Progressing   Problem: RH SAFETY Goal: RH STG ADHERE TO SAFETY PRECAUTIONS W/ASSISTANCE/DEVICE Description: STG Adhere to Safety Precautions With cues  Assistance/Device. Outcome: Progressing   Problem: RH KNOWLEDGE DEFICIT Goal: RH STG INCREASE KNOWLEDGE OF DIABETES Description: Patient will be able to manage DM with medications and dietary modifications using handouts and educational resources independently Outcome: Progressing Goal: RH STG INCREASE KNOWLEDGE OF HYPERTENSION Description: Patient will be able to manage HTN with medications and dietary modifications using handouts and educational resources independently Outcome: Progressing Goal: RH STG INCREASE KNOWLEGDE OF HYPERLIPIDEMIA Description: Patient will be able to manage HLD with medications and dietary modifications using handouts and educational resources independently Outcome: Progressing Goal: RH STG INCREASE KNOWLEDGE OF STROKE PROPHYLAXIS Description: Patient will be able to manage secondary stroke risks with medications and dietary modifications using handouts and educational resources independently Outcome: Progressing   Problem: RH KNOWLEDGE DEFICIT GENERAL Goal: RH STG INCREASE KNOWLEDGE OF SELF CARE AFTER HOSPITALIZATION Outcome: Progressing

## 2022-02-13 NOTE — Progress Notes (Signed)
Physical Therapy Session Note  Patient Details  Name: [name] MRN: [id number] Date of Birth: 1955/10/02  Today's Date: 02/13/2022 PT Individual Time: 1000-1100; 3235-5732 PT Individual Time Calculation (min): 60 min and 29 min  Short Term Goals: Week 1:  PT Short Term Goal 1 (Week 1): = to LTGs based on ELOS  Skilled Therapeutic Interventions/Progress Updates:    Session 1: Pt received seated in recliner in room, agreeable to PT session. No complaints of pain. Sit to stand at Supervision level with and without rollator during session. Ambulation up to 150 ft with rollator at Supervision level during session. Sit to supine at CGA level on flat mat table, poor eccentric core control. Supine to sit initially with CGA increasing to min A with onset of fatigue, cues for UE placement to push up to return to sitting via logroll. Seated L/R lateral leans x 10 reps each onto pillow on mat table with Supervision, increased difficulty with RUE as compared to LUE due to partial RTC tear for partial practice of supine to sit transfer. Tall-kneeling on mat table with use of bench for UE support during rest breaks. Pt able to perform alt UE reaching 3 x 15 reps in tall-kneeling position with min A for balance, perched rest breaks on bench between each set due to fatigue. Quadruped alt LE lifts x 4 reps each before LOB onto mat table due to UE weakness. Pt able to safely return to sitting position EOM with mod A for trunk elevation. Deferred further attempts at quadruped this session due to UE fatigue. Standing mini-squats 3 x 10 reps with no UE support and min A for balance. Sidesteps L/R 5 x 10 ft with no AD and CGA for balance, several LOB that pt able to correct with CGA. Pt returned to recliner at end of session, needs in reach. Pt reports feeling significantly fatigued at end of session.  Session 2: Pt received seated in recliner in room, agreeable to PT session. No complaints of pain, does report some  soreness/tightness in pecs, quads, and hip flexors. Sit to stand and transfers with rollator at Supervision level during session. Ambulation to/from therapy gym at Supervision level with rollator. Reviewed standing warrior stretch for hip flexors 3 x 30 sec B in // bars for balance. Attempted standing quad stretch but unable to perform due to balance deficits and UE weakness. Transitioned to sidelying position where pt still requires some assist to perform stretch, reviewed how to utilize gait belt to assist with stretch. Standing pec doorway stretch 3 x 30 sec B. Provided handout for stretches, see link below. Seated balance/core strengthening on swiss ball perform anterior/posterior and lateral pelvic tilts x 10 reps each direction, alt UE lifts x 10 reps, 1 kg weighted ball punchouts x 15 reps. Attempted seated marches and LAQ but pt exhibits difficulty lifting LE in this position on swiss ball. Pt returned to room and left seated in recliner with needs in reach.  Access Code: [email or web address] URL: [email or web address] Date: 02/13/2022 Prepared by: Excell Seltzer  Exercises - Warrior I  - 1 x daily - 7 x weekly - 1 sets - 3-4 reps - 30-60 sec hold - Sidelying Quadriceps Stretch  - 1 x daily - 7 x weekly - 1 sets - 3-4 reps - 30-60 sec hold - Doorway Pec Stretch at 90 Degrees Abduction  - 1 x daily - 7 x weekly - 1 sets - 3-4 reps - 30-60 sec hold   Therapy Documentation Precautions:  Precautions Precautions: Fall, Other (comment) Precaution Comments: proximal muscle weakness Restrictions Weight Bearing Restrictions: No       Therapy/Group: Individual Therapy   Excell Seltzer, PT, DPT, CSRS 02/13/2022, 11:54 AM

## 2022-02-13 NOTE — Progress Notes (Addendum)
Occupational Therapy Session Note  Patient Details  Name: [name] MRN: [id number] Date of Birth: 1956-01-15  Today's Date: 02/13/2022 OT Individual Time: 0802-0857 session 1 OT Individual Time Calculation (min): 55 min  Session 2: 4259-5638 ( 45mns)   Short Term Goals: Week 1:  OT Short Term Goal 1 (Week 1): STGs=LTGs  Skilled Therapeutic Interventions/Progress Updates:  Session 1: Pt greeted seated in recliner  agreeable to OT intervention.  Pt ambulated from room to gym with rollator with supervision. Pt completed various therapeutic activities as indicated below to promote  improved core strength for improved postural control for ADL participation, increasing overall activity tolerance for BADLs,  and dynamic standing balance:  - standing ball tosses  to trampoline for 1 min with CGA with no UE support - standing ball tosses to trampoline with 2.5 lb wrist weights donned for 1 min with CGA - x10 sit<>stand with no UE support - attempted transitional wood choppers from sit>stand however pt with decreased ability to power up into standing while core was rotated with pt only able to complete 5 before needing to sit  - standing wood choppers R and L with 2.5 wrist weights - modified crunches to therapy ball positioned posteriorly behind pt on EOM with pt instructed to complete crunch and then toss ball to trampoline and repeat; pt completed task for 2 sets  of 1 min rounds      - oblique crunches from EOM with pt instructed to lean laterally back with each UE instructed to slide pillowcase away from body and then back to midline pt completed 2x10 reps - pt transitioned from sitting EOM>to long sitting to stretch hamstrings with CGA  completing 2x10 quad/glute sets; education provided on importance of streching in AM  - supine upright reaches to increase strength in upper abs 2x10 - practiced supine>sit from EOM with pt able to complete task with + effort and time, education provided  on various techniques for bed mobility inlcuding getting a bed rail, used gati belt around bd frame to act as a rail    Pt ambulated back to room with rollator and supervision, pt completed 3/3 toileting tasks with supervision.        pt left seated in recliner with all needs within reach.                       Session 2: Pt greeted seated in recliner agreeable to OT intervention. Session focus on practicing [facility]( pt ordered bed rail for home), functional mobility,core strength, increasing overall activity tolerance, BUE/BLE strength/endurance and decreasing overall caregiver burden.   Pt completed functional ambulation from room to apt with rollator with supervision. OTA placed bed rail on flat bed with pt able to complete supine<>sit 2x MOD I! Pt very pleased about this as he reports being concerned about how to get OOB at home.    Pt ambulated to gym with supervision with rollator. Pt completed below therapeutic activities to work on BUE/BLE strength/endurance and core strength:           - modified crunches leaning back onto wedge from EOM holding 2 lb dowel rod at chest height 2x10 reps -mini squats with UE support 2x10 reps - pulsing squats with light UE support on rollator 2x10 reps - ball tosses with pt using 2 lb dowel rod to toss ball back and forth for 3 mins - x20 bicep curls with 3 lb  dowel rod - x20 scapular protraction/retraction with 3 lb dowel rod - AAROM shoulder stretching using dowel rod for horizontal shoulder ADD/ABD below chest level  Pt ambulated back to room with supervision with pt left seated in recliner with all needs within reach.   Therapy Documentation Precautions:  Precautions Precautions: Fall, Other (comment) Precaution Comments: proximal muscle weakness Restrictions Weight Bearing Restrictions: No  Pain: Session 1: no pain reported during session  Session 2: no pain reported during session     Therapy/Group:  Individual Therapy  Corinne Ports [facility] 02/13/2022, 8:58 AM

## 2022-02-13 NOTE — Progress Notes (Signed)
PROGRESS NOTE   Subjective/Complaints:  Patient seen in chair today.  In good spirits, said he did a lot of work with PT today and is sore in arms and shoulders. CBG's better without am hypoglycemia, but lower in am than later in day. Sleeping and eating well.  ROS- neg CP, SOB, N/V/D  Objective:   No results found. No results for input(s): WBC, HGB, HCT, PLT in the last 72 hours. No results for input(s): NA, K, CL, CO2, GLUCOSE, BUN, CREATININE, CALCIUM in the last 72 hours.  Intake/Output Summary (Last 24 hours) at 02/13/2022 1130 Last data filed at 02/13/2022 0715 Gross per 24 hour  Intake 1032 ml  Output --  Net 1032 ml        Physical Exam: Vital Signs Blood pressure (!) 118/57, pulse 70, temperature 98 F (36.7 C), resp. rate 17, height '5\' 10"'$  (1.778 m), weight 80.2 kg, SpO2 96 %.   General: No acute distress Mood and affect are appropriate Heart: Regular rate and rhythm no rubs murmurs or extra sounds Lungs: Clear to auscultation, breathing unlabored, no rales or wheezes Abdomen: Positive bowel sounds, soft nontender to palpation, nondistended Extremities: No clubbing, cyanosis, or edema Skin: No evidence of breakdown, no evidence of rash Neurologic: Cranial nerves II through XII intact, motor strength is 3-/5 in bilateral deltoid, 4- bicep, tricep,5/5 grip, 3- hip flexor, 4/5 knee extensors, 5/5 ankle dorsiflexor and plantar flexor Musculoskeletal: Full range of motion in all 4 extremities. No joint swelling. Needs to push on hands to go from sitting to standing.  Physical exam same as 02/12/22 Assessment/Plan: 1. Functional deficits which require 3+ hours per day of interdisciplinary therapy in a comprehensive inpatient rehab setting. Physiatrist is providing close team supervision and 24 hour management of active medical problems listed below. Physiatrist and rehab team continue to assess barriers to  discharge/monitor patient progress toward functional and medical goals  Care Tool:  Bathing    Body parts bathed by patient: Right arm, Left arm, Chest, Abdomen, Front perineal area, Buttocks, Right upper leg, Left upper leg, Right lower leg, Left lower leg, Face   Body parts bathed by helper:  (asisted to wash hair)     Bathing assist Assist Level: Minimal Assistance - Patient > 75%     Upper Body Dressing/Undressing Upper body dressing   What is the patient wearing?: Pull over shirt    Upper body assist Assist Level: Set up assist    Lower Body Dressing/Undressing Lower body dressing      What is the patient wearing?: Pants, Underwear/pull up     Lower body assist Assist for lower body dressing: Minimal Assistance - Patient > 75%     Toileting Toileting    Toileting assist Assist for toileting: Supervision/Verbal cueing     Transfers Chair/bed transfer  Transfers assist     Chair/bed transfer assist level: Supervision/Verbal cueing Chair/bed transfer assistive device: Programmer, multimedia   Ambulation assist      Assist level: Contact Guard/Touching assist Assistive device: Walker-rolling Max distance: 236f   Walk 10 feet activity   Assist     Assist level: Contact Guard/Touching assist Assistive device: Walker-rolling  Walk 50 feet activity   Assist    Assist level: Contact Guard/Touching assist Assistive device: Walker-rolling    Walk 150 feet activity   Assist    Assist level: Contact Guard/Touching assist Assistive device: Walker-rolling    Walk 10 feet on uneven surface  activity   Assist     Assist level: Minimal Assistance - Patient > 75% Assistive device: Other (comment) (railing)   Wheelchair     Assist Is the patient using a wheelchair?: No             Wheelchair 50 feet with 2 turns activity    Assist            Wheelchair 150 feet activity     Assist           Blood pressure (!) 118/57, pulse 70, temperature 98 F (36.7 C), resp. rate 17, height '5\' 10"'$  (1.778 m), weight 80.2 kg, SpO2 96 %.    Medical Problem List and Plan: 1. Functional deficits secondary Inflammatory myositis             -Statin induced myopathy also in differential             -patient may  shower             -ELOS/Goals: 5-7 days             -Admit to CIR 2.  Antithrombotics: -DVT/anticoagulation:  Pharmaceutical: Lovenox             -antiplatelet therapy: ASA/Brilinta 3. Pain Management: Tylenol prn.  4. Mood: LCSW to follow for evaluation and support.              -antipsychotic agents: N/A 5. Neuropsych: This patient is capable of making decisions on his own behalf. 6. Skin/Wound Care: Routine pressure relief measures.  7. Fluids/Electrolytes/Nutrition: Monitor I/O. Check CMET/CK in am.    Latest Ref Rng & Units 02/09/2022    6:19 AM 02/08/2022    1:41 AM 02/07/2022    1:52 AM  BMP  Glucose 70 - 99 mg/dL 98   239   221    BUN 8 - 23 mg/dL '25   29   29    '$ Creatinine 0.61 - 1.24 mg/dL 0.44   0.57   0.51    Sodium 135 - 145 mmol/L 139   137   139    Potassium 3.5 - 5.1 mmol/L 4.0   4.4   4.2    Chloride 98 - 111 mmol/L 104   104   107    CO2 22 - 32 mmol/L '27   24   25    '$ Calcium 8.9 - 10.3 mg/dL 9.3   9.0   9.2      8.  Inflammatory myositis v/s statin induced myositis:CK stabilizing at 2000 no muscle pain or renal abnormalities              -Outpatient muscle biopsy in about 3-4 months             -Prednisone '80mg'$  PO qd            -5/21 Continue prednisone 80 mg qd, with taper, next decrease to 70 mg on Tuesday 5/23 9. CAD s/p DES to RCA: Monitor for symptoms with increase in activity.              --on ASA, Brilinta, coreg and lisinopril --was to start Repatha PTA.               -  Denies Chest pain 10. T2DM: Hgb A1c-7.4. Monitor BS ac/hs and use SSI for elevated BS.  --Was on metformin 1000 mg bid, Lantus 30 u/hs and Actos 30 mg PTA.              --Blood  sugars poorly controlled likely due to solumedrol--now on 80 mg daily --Anticipate improvement with increase in activity and question taper.             --Continue insulin glargine bid and resume metformin for better control.  CBG (last 3)  Recent Labs    02/12/22 2039 02/13/22 0457 02/13/22 0620  GLUCAP 193* 79 82  Low CBG this am  Expect improvement with steroid taper, reduce semglee to 15 U BID due to early am CBG drop , increase novalog to 6U and monitor effect  -5/20 Patient is having hypoglycemic events in the am but high CBG's late afternoon and evening. -Reduced SSI from 6U insulin aspart to 5U insulin aspart.  Will continue prednisone taper of 80 mg daily which will decrease to 70 mg daily on Tuesday 5/23.  Will trend to see if further reduction in insulin aspart is needed. 5/21 Continue to trend, no low am CBG's but further adjustment may be needed to control afternoon, night CBG's. 11. HTN: Monitor BP TID--continue Norvasc, Lisinopril and coreg.             Overall well controlled, continue to monitor             5/21 SBP 110's-130's    02/13/2022   11:06 AM 02/13/2022    4:48 AM 02/12/2022    7:30 PM  Vitals with BMI  Systolic 761 607 371  Diastolic 57 59 53  Pulse  70 82    12.  Pre-renal azotemia: Encourage fluid intake.  --Recheck BUN/SCr improving  5/20 last BUN 25 and Crt 0.44 (5/17) continue to trend qMonday labs.    Latest Ref Rng & Units 02/09/2022    6:19 AM 02/08/2022    1:41 AM 02/07/2022    1:52 AM  BMP  Glucose 70 - 99 mg/dL 98   239   221    BUN 8 - 23 mg/dL '25   29   29    '$ Creatinine 0.61 - 1.24 mg/dL 0.44   0.57   0.51    Sodium 135 - 145 mmol/L 139   137   139    Potassium 3.5 - 5.1 mmol/L 4.0   4.4   4.2    Chloride 98 - 111 mmol/L 104   104   107    CO2 22 - 32 mmol/L '27   24   25    '$ Calcium 8.9 - 10.3 mg/dL 9.3   9.0   9.2      13. Abnormal LFTs: Resolving. Recheck in am.  14. Hypokalemia: Mg- 2.1 and WNL. Likely due to steroids.  --Continue  Kdur 40 meq daily for supplement.  5/21 corrected, continue to monitor. 15.  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  Leukocytosis due to steroids afebrile   5/21 Likely reactive d/t steroids, no other s/s of infection    Latest Ref Rng & Units 02/09/2022    6:19 AM 02/05/2022    1:43 AM 02/03/2022    2:21 AM  CBC  WBC 4.0 - 10.5 K/uL 14.8   11.8   10.8    Hemoglobin 13.0 - 17.0 g/dL 15.7   13.1   12.7  Hematocrit 39.0 - 52.0 % 45.4   39.4   38.9    Platelets 150 - 400 K/uL 336   267   273       LOS: 5 days A FACE TO FACE EVALUATION WAS PERFORMED  Luetta Nutting 02/13/2022, 11:30 AM

## 2022-02-14 LAB — BASIC METABOLIC PANEL
Anion gap: 8 (ref 5–15)
BUN: 27 mg/dL — ABNORMAL HIGH (ref 8–23)
CO2: 27 mmol/L (ref 22–32)
Calcium: 8.9 mg/dL (ref 8.9–10.3)
Chloride: 104 mmol/L (ref 98–111)
Creatinine, Ser: 0.49 mg/dL — ABNORMAL LOW (ref 0.61–1.24)
GFR, Estimated: >60 mL/min (ref >60)
Glucose, Bld: 69 mg/dL — ABNORMAL LOW (ref 70–99)
Potassium: 3.9 mmol/L (ref 3.5–5.1)
Sodium: 139 mmol/L (ref 135–145)

## 2022-02-14 LAB — GLUCOSE, CAPILLARY
Glucose-Capillary: 80 mg/dL (ref 70–99)
Glucose-Capillary: 132 mg/dL — ABNORMAL HIGH (ref 70–99)
Glucose-Capillary: 178 mg/dL — ABNORMAL HIGH (ref 70–99)
Glucose-Capillary: 274 mg/dL — ABNORMAL HIGH (ref 70–99)

## 2022-02-14 LAB — CBC
HCT: 39.3 % (ref 39.0–52.0)
Hemoglobin: 13.7 g/dL (ref 13.0–17.0)
MCH: 32.4 pg (ref 26.0–34.0)
MCHC: 34.9 g/dL (ref 30.0–36.0)
MCV: 92.9 fL (ref 80.0–100.0)
Platelets: 292 10*3/uL (ref 150–400)
RBC: 4.23 MIL/uL (ref 4.22–5.81)
RDW: 13.7 % (ref 11.5–15.5)
WBC: 21.7 10*3/uL — ABNORMAL HIGH (ref 4.0–10.5)
nRBC: 0.1 % (ref 0.0–0.2)

## 2022-02-14 NOTE — Plan of Care (Signed)
  Problem: Consults Goal: RH STROKE PATIENT EDUCATION Description: See Patient Education module for education specifics  Outcome: Progressing   Problem: RH SAFETY Goal: RH STG ADHERE TO SAFETY PRECAUTIONS W/ASSISTANCE/DEVICE Description: STG Adhere to Safety Precautions With cues  Assistance/Device. Outcome: Progressing   Problem: RH KNOWLEDGE DEFICIT Goal: RH STG INCREASE KNOWLEDGE OF DIABETES Description: Patient will be able to manage DM with medications and dietary modifications using handouts and educational resources independently Outcome: Progressing Goal: RH STG INCREASE KNOWLEDGE OF HYPERTENSION Description: Patient will be able to manage HTN with medications and dietary modifications using handouts and educational resources independently Outcome: Progressing Goal: RH STG INCREASE KNOWLEGDE OF HYPERLIPIDEMIA Description: Patient will be able to manage HLD with medications and dietary modifications using handouts and educational resources independently Outcome: Progressing Goal: RH STG INCREASE KNOWLEDGE OF STROKE PROPHYLAXIS Description: Patient will be able to manage secondary stroke risks with medications and dietary modifications using handouts and educational resources independently Outcome: Progressing   Problem: RH KNOWLEDGE DEFICIT GENERAL Goal: RH STG INCREASE KNOWLEDGE OF SELF CARE AFTER HOSPITALIZATION Outcome: Progressing

## 2022-02-14 NOTE — Discharge Instructions (Signed)
Inpatient Rehab Discharge Instructions  [name] Discharge date and time: No discharge date for patient encounter.   Activities/Precautions/ Functional Status: Activity: activity as tolerated Diet: diabetic diet Wound Care: Routine skin checks Functional status:  ___ No restrictions     ___ Walk up steps independently ___ 24/7 supervision/assistance   ___ Walk up steps with assistance ___ Intermittent supervision/assistance  ___ Bathe/dress independently ___ Walk with walker     _x__ Bathe/dress with assistance ___ Walk Independently    ___ Shower independently ___ Walk with assistance    ___ Shower with assistance ___ No alcohol     ___ Return to work/school ________  Special Instructions: No driving smoking or alcohol   My questions have been answered and I understand these instructions. I will adhere to these goals and the provided educational materials after my discharge from the hospital.  Patient/Caregiver Signature _______________________________ Date __________  Clinician Signature _______________________________________ Date __________  Please bring this form and your medication list with you to all your follow-up doctor's appointments.

## 2022-02-14 NOTE — Progress Notes (Signed)
PROGRESS NOTE   Subjective/Complaints:  Discussed weakness, driving, and work restrictions   ROS- neg CP, SOB, N/V/D  Objective:   No results found. No results for input(s): WBC, HGB, HCT, PLT in the last 72 hours. No results for input(s): NA, K, CL, CO2, GLUCOSE, BUN, CREATININE, CALCIUM in the last 72 hours.  Intake/Output Summary (Last 24 hours) at 02/14/2022 0726 Last data filed at 02/13/2022 1805 Gross per 24 hour  Intake 480 ml  Output --  Net 480 ml         Physical Exam: Vital Signs Blood pressure 120/68, pulse 72, temperature 98 F (36.7 C), resp. rate 17, height '5\' 10"'$  (1.778 m), weight 81.6 kg, SpO2 99 %.   General: No acute distress Mood and affect are appropriate Heart: Regular rate and rhythm no rubs murmurs or extra sounds Lungs: Clear to auscultation, breathing unlabored, no rales or wheezes Abdomen: Positive bowel sounds, soft nontender to palpation, nondistended Extremities: No clubbing, cyanosis, or edema Skin: No evidence of breakdown, no evidence of rash Neurologic: Cranial nerves II through XII intact, motor strength is 3-/5 in bilateral deltoid, 4- bicep, tricep,5/5 grip, 3- hip flexor, 4/5 knee extensors, 5/5 ankle dorsiflexor and plantar flexor Musculoskeletal: Full range of motion in all 4 extremities. No joint swelling. Needs to push on hands to go from sitting to standing.  Physical exam same as 02/12/22 Assessment/Plan: 1. Functional deficits which require 3+ hours per day of interdisciplinary therapy in a comprehensive inpatient rehab setting. Physiatrist is providing close team supervision and 24 hour management of active medical problems listed below. Physiatrist and rehab team continue to assess barriers to discharge/monitor patient progress toward functional and medical goals  Care Tool:  Bathing    Body parts bathed by patient: Right arm, Left arm, Chest, Abdomen, Front  perineal area, Buttocks, Right upper leg, Left upper leg, Right lower leg, Left lower leg, Face   Body parts bathed by helper:  (asisted to wash hair)     Bathing assist Assist Level: Minimal Assistance - Patient > 75%     Upper Body Dressing/Undressing Upper body dressing   What is the patient wearing?: Pull over shirt    Upper body assist Assist Level: Set up assist    Lower Body Dressing/Undressing Lower body dressing      What is the patient wearing?: Pants, Underwear/pull up     Lower body assist Assist for lower body dressing: Minimal Assistance - Patient > 75%     Toileting Toileting    Toileting assist Assist for toileting: Supervision/Verbal cueing     Transfers Chair/bed transfer  Transfers assist     Chair/bed transfer assist level: Supervision/Verbal cueing Chair/bed transfer assistive device: Programmer, multimedia   Ambulation assist      Assist level: Contact Guard/Touching assist Assistive device: Walker-rolling Max distance: 251f   Walk 10 feet activity   Assist     Assist level: Contact Guard/Touching assist Assistive device: Walker-rolling   Walk 50 feet activity   Assist    Assist level: Contact Guard/Touching assist Assistive device: Walker-rolling    Walk 150 feet activity   Assist    Assist level: Contact  Guard/Touching assist Assistive device: Walker-rolling    Walk 10 feet on uneven surface  activity   Assist     Assist level: Minimal Assistance - Patient > 75% Assistive device: Other (comment) (railing)   Wheelchair     Assist Is the patient using a wheelchair?: No             Wheelchair 50 feet with 2 turns activity    Assist            Wheelchair 150 feet activity     Assist          Blood pressure 120/68, pulse 72, temperature 98 F (36.7 C), resp. rate 17, height '5\' 10"'$  (1.778 m), weight 81.6 kg, SpO2 99 %.    Medical Problem List and Plan: 1.  Functional deficits secondary Inflammatory myositis             -Statin induced myopathy also in differential             -patient may  shower- pt was able to shower with supervision              -ELOS/Goals: 5-7 days             -Admit to CIR 2.  Antithrombotics: -DVT/anticoagulation:  Pharmaceutical: Lovenox             -antiplatelet therapy: ASA/Brilinta 3. Pain Management: Tylenol prn.  4. Mood: LCSW to follow for evaluation and support.              -antipsychotic agents: N/A 5. Neuropsych: This patient is capable of making decisions on his own behalf. 6. Skin/Wound Care: Routine pressure relief measures.  7. Fluids/Electrolytes/Nutrition: Monitor I/O. Check CMET/CK in am.    Latest Ref Rng & Units 02/09/2022    6:19 AM 02/08/2022    1:41 AM 02/07/2022    1:52 AM  BMP  Glucose 70 - 99 mg/dL 98   239   221    BUN 8 - 23 mg/dL '25   29   29    '$ Creatinine 0.61 - 1.24 mg/dL 0.44   0.57   0.51    Sodium 135 - 145 mmol/L 139   137   139    Potassium 3.5 - 5.1 mmol/L 4.0   4.4   4.2    Chloride 98 - 111 mmol/L 104   104   107    CO2 22 - 32 mmol/L '27   24   25    '$ Calcium 8.9 - 10.3 mg/dL 9.3   9.0   9.2      8.  Inflammatory myositis v/s statin induced myositis:CK stabilizing at 2000 no muscle pain or renal abnormalities              -Outpatient muscle biopsy in about 3-4 months             -Prednisone '80mg'$  PO qd            -5/21 Continue prednisone 80 mg qd, with taper, next decrease to 70 mg on Tuesday 5/23 9. CAD s/p DES to RCA: Monitor for symptoms with increase in activity.              --on ASA, Brilinta, coreg and lisinopril --was to start Repatha PTA.               -Denies Chest pain 10. T2DM: Hgb A1c-7.4. Monitor BS ac/hs and use SSI for elevated BS.  --Was on metformin 1000 mg  bid, Lantus 30 u/hs and Actos 30 mg PTA.              --Blood sugars poorly controlled likely due to solumedrol--now on 80 mg daily --Anticipate improvement with increase in activity and question  taper.             --Continue insulin glargine bid and resume metformin for better control.  CBG (last 3)  Recent Labs    02/13/22 1702 02/13/22 2037 02/14/22 0547  GLUCAP 193* 221* 80   Low CBG this am  Expect improvement with steroid taper, reduce semglee to 15 U BID due to early am CBG drop , increase novalog to 6U and monitor effect  -5/20 Patient is having hypoglycemic events in the am but high CBG's late afternoon and evening. -Reduced SSI from 6U insulin aspart to 5U insulin aspart.  Will continue prednisone taper of 80 mg daily which will decrease to 70 mg daily on Tuesday 5/23.  Will trend to see if further reduction in insulin aspart is needed. Morning CBG still on low side but will cont current Semglee and enc hs snack , needs to go back to 6U Novalog for daytime elevation  11. HTN: Monitor BP TID--continue Norvasc, Lisinopril and coreg.             Overall well controlled, continue to monitor Controlled 5/22    02/14/2022    5:47 AM 02/13/2022    7:42 PM 02/13/2022    2:01 PM  Vitals with BMI  Weight 179 lbs 14 oz    BMI 27.74    Systolic 128 786 767  Diastolic 68 66 75  Pulse 72 77 87    12.  Pre-renal azotemia: Encourage fluid intake.  --Recheck BUN/SCr improving  5/20 last BUN 25 and Crt 0.44 (5/17) continue to trend qMonday labs.    Latest Ref Rng & Units 02/09/2022    6:19 AM 02/08/2022    1:41 AM 02/07/2022    1:52 AM  BMP  Glucose 70 - 99 mg/dL 98   239   221    BUN 8 - 23 mg/dL '25   29   29    '$ Creatinine 0.61 - 1.24 mg/dL 0.44   0.57   0.51    Sodium 135 - 145 mmol/L 139   137   139    Potassium 3.5 - 5.1 mmol/L 4.0   4.4   4.2    Chloride 98 - 111 mmol/L 104   104   107    CO2 22 - 32 mmol/L '27   24   25    '$ Calcium 8.9 - 10.3 mg/dL 9.3   9.0   9.2      13. Abnormal LFTs: Resolving. Recheck in am.  14. Hypokalemia: Mg- 2.1 and WNL. Likely due to steroids.  --Continue Kdur 40 meq daily for supplement.  5/21 corrected, continue to monitor. 15.  Right  pontine infarct noted on MRI- no neuro findings   16.  Leukocytosis due to steroids afebrile   5/21 Likely reactive d/t steroids, no other s/s of infection    Latest Ref Rng & Units 02/09/2022    6:19 AM 02/05/2022    1:43 AM 02/03/2022    2:21 AM  CBC  WBC 4.0 - 10.5 K/uL 14.8   11.8   10.8    Hemoglobin 13.0 - 17.0 g/dL 15.7   13.1   12.7    Hematocrit 39.0 - 52.0 % 45.4   39.4  38.9    Platelets 150 - 400 K/uL 336   267   273       LOS: 6 days A FACE TO FACE EVALUATION WAS PERFORMED  Charlett Blake 02/14/2022, 7:26 AM

## 2022-02-14 NOTE — Progress Notes (Signed)
Physical Therapy Session Note  Patient Details  Name: [name] MRN: [id number] Date of Birth: 14-Dec-1955  Today's Date: 02/14/2022 PT Individual Time: 1515-1600 PT Individual Time Calculation (min): 45 min  and Today's Date: 02/14/2022 PT Missed Time: 30 Minutes Missed Time Reason: Patient fatigue  Short Term Goals: Week 1:  PT Short Term Goal 1 (Week 1): = to LTGs based on ELOS  Skilled Therapeutic Interventions/Progress Updates:     Pt received seated in recliner and agrees to therapy. No complaint of pain. Sit to stand with rollator and mod(I). Pt ambulates to elevator and navigates with cues for AD management. Pt ambulates outside with rollator for gait training over unlevel and varying surfaces. Pt ambulates >1500' with cues for safety but otherwise completing without cueing required. Pt requires extended seated rest break inside. Pt returns to unit and PT suggests attempting floor transfer. Pt verbalizes that he is feeling very fatigued from the walk and politely requests to return to room at this time. Pt misses 30 minutes of skilled PT due to fatigue. Left seated in recliner with all needs within reach. Pt made mod(I) in room and RN aware.  Therapy Documentation Precautions:  Precautions Precautions: Fall, Other (comment) Precaution Comments: proximal muscle weakness Restrictions Weight Bearing Restrictions: No   Therapy/Group: Individual Therapy  Breck Coons, PT, DPT 02/14/2022, 4:39 PM

## 2022-02-14 NOTE — Discharge Summary (Signed)
Physician Discharge Summary  Patient ID: [name] MRN: [id number] DOB/AGE: Mar 31, 1956 66 y.o.  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 Infarct by MRI. EXAM: CT ANGIOGRAPHY HEAD AND NECK TECHNIQUE: Multidetector CT imaging of the head and neck was performed using the standard protocol during bolus administration of intravenous contrast. Multiplanar CT image reconstructions and MIPs were obtained to evaluate the vascular anatomy. Carotid stenosis measurements (when applicable) are obtained utilizing NASCET criteria, using the distal internal carotid diameter as the denominator. RADIATION DOSE REDUCTION: This exam was performed according to the departmental dose-optimization program which includes automated exposure control, adjustment of the mA and/or kV according to patient size and/or use of iterative reconstruction technique. CONTRAST:  70m OMNIPAQUE IOHEXOL 350 MG/ML SOLN COMPARISON:  Brain MRI from yesterday FINDINGS: CT HEAD FINDINGS Brain: Known acute right pontine infarct is largely obscured by skull base artifact. No evidence of interval infarct, hemorrhage, or hydrocephalus. Vascular: See below Skull: Negative Sinuses: Negative Orbits: Negative Review of the MIP images confirms the above findings CTA NECK FINDINGS Aortic arch: Atheromatous plaque with 2 vessel branching. Right carotid system: Low-density atheromatous wall thickening of the common carotid with moderate calcified plaque at the bifurcation and proximal ICA. Proximal ICA stenosis measures  70%. No ulceration or downstream beading. Left carotid system: Atheromatous wall thickening of the common carotid with mixed density plaque at the bifurcation and proximal ICA. Proximal ICA stenosis measures 50%. No ulceration. Vertebral arteries: Proximal subclavian atherosclerosis, greater on the left without flow limiting stenosis. Calcified plaque at the left vertebral origin causing 40 % stenosis. Calcified plaque at the right vertebral origin with 50% stenosis. Skeleton: Multilevel cervical disc and facet degeneration. No acute or aggressive finding. Other neck: No acute finding Upper chest: No acute finding Review of the MIP images confirms the above findings CTA HEAD FINDINGS Anterior circulation: Atheromatous calcification along the carotid siphons with focal right paraclinoid ICA stenosis measuring at least 70%. No branch occlusion, beading, or aneurysm. Hypoplastic left A1 segment with probable superimposed origin stenosis. Posterior circulation: The vertebral and basilar arteries are diffusely patent. Mild basilar stenosis. Moderate bilateral PCA stenosis, P2 segment on the left and P3 segment on the right. Venous sinuses: Negative Anatomic variants: None significant Review of the MIP images confirms the above findings IMPRESSION: 1. No emergent finding.  Extensive atherosclerosis. 2. Right ICA stenosis measuring at least 70% proximally and 70% at the paraclinoid segment. 3. 50% proximal left ICA stenosis. 4. Bilateral vertebral origin stenosis measuring 50% on the right and 40% on the left. Electronically Signed   By: JJorje GuildM.D.   On: 02/04/2022 07:49   DG Chest 2 View  Result Date: 02/02/2022 CLINICAL DATA:  Chest pain.  Arm weakness. EXAM: CHEST - 2 VIEW COMPARISON:  12/27/2021 FINDINGS: Heart size upper limits of normal. Aortic atherosclerosis. Chronic interstitial lung markings. No sign active infiltrate, collapse or effusion. Ordinary mild degenerative changes affect the spine.  IMPRESSION: No active disease.  Chronic lung markings.  Aortic atherosclerosis. Electronically Signed   By: MNelson ChimesM.D.   On: 02/02/2022 17:22   MR BRAIN WO CONTRAST  Result Date: 02/03/2022 CLINICAL DATA:  Motor neuron disease suspected EXAM: MRI HEAD WITHOUT CONTRAST TECHNIQUE: Multiplanar, multiecho pulse sequences of the brain and surrounding structures were obtained without intravenous contrast. COMPARISON:  None Available. FINDINGS: Brain: Acute infarct in the right (series 3 and 300, image 11). Mild edema without significant mass effect. Additional moderate scattered T2/FLAIR hyperintensities in the white matter, nonspecific but compatible with chronic microvascular ischemic disease. Small remote lacunar infarct in the left corona radiata. No evidence of acute hemorrhage, mass lesion, midline shift, hydrocephalus, or extra-axial fluid collection. Vascular: Major arterial flow voids are maintained at the skull base. Skull and upper cervical spine: Normal marrow signal. Sinuses/Orbits: Clear sinuses.  No acute orbital findings. Other: No mastoid effusions. IMPRESSION: 1. Acute right pontine infarct. 2. Moderate chronic microvascular ischemic disease. Electronically Signed   By: Margaretha Sheffield M.D.   On: 02/03/2022 09:38   MR CERVICAL SPINE WO CONTRAST  Result Date: 02/03/2022 CLINICAL DATA:  Progressive symmetric proximal muscle weakness weight loss common bilateral leg weakness EXAM: MRI CERVICAL SPINE WITHOUT CONTRAST TECHNIQUE: Multiplanar, multisequence MR imaging of the cervical spine was performed. No intravenous contrast was administered. COMPARISON:  None Available. FINDINGS: Alignment: There is straightening of the normal cervical lordosis. There is grade 1 anterolisthesis of C3 on C4. Vertebrae: Vertebral body heights are preserved. Background marrow signal is normal. There is no suspicious signal abnormality or marrow edema. Cord: Normal in signal and morphology. Posterior Fossa,  vertebral arteries, paraspinal tissues: The posterior fossa and right pontine infarct are assessed on the separately dictated brain MRI. Vertebral artery flow voids are normal. The paraspinal soft tissues are unremarkable. Disc levels: There is disc desiccation and narrowing most advanced at C5-C6 and C6-C7. The axial T2 images are motion degraded, limiting evaluation of the foramina. C2-C3: There is mild uncovertebral and bilateral facet arthropathy resulting in mild right and no significant left neural foraminal stenosis and no significant spinal canal stenosis C3-C4: There is left worse than right uncovertebral ridging and facet arthropathy with a small left-sided effusion resulting in severe left and mild right neural foraminal stenosis without significant spinal canal stenosis C4-C5: There is mild uncovertebral and bilateral facet arthropathy resulting in moderate left worse than right neural foraminal stenosis without significant spinal canal stenosis C5-C6: There is prominent right-sided uncovertebral ridging common bilateral facet arthropathy, and a large broad-based right paracentral osteophyte/protrusion resulting in mild narrowing of the right aspect of the spinal canal and severe right neural foraminal stenosis with likely impingement of the exiting C6 nerve root, and mild-to-moderate left neural foraminal stenosis C6-C7: There is bilateral uncovertebral ridging and facet arthropathy resulting in severe left and moderate right neural foraminal stenosis without significant spinal canal stenosis C7-T1: No significant spinal canal or neural foraminal stenosis. IMPRESSION: 1. At C3-C4, there is grade 1 anterolisthesis, left worse than right uncovertebral ridging, and facet arthropathy with a small left effusion resulting in severe left and mild right neural foraminal stenosis. 2. At C5-C6, there is right worse than left uncovertebral ridging, bilateral facet arthropathy, and a prominent right paracentral  osteophyte/protrusion resulting in severe right neural foraminal stenosis with likely impingement of the exiting C6 nerve root, and mild-to-moderate left neural foraminal stenosis. 3. Severe left and moderate right neural foraminal stenosis at C6-C7. Electronically Signed   By: Valetta Mole M.D.   On: 02/03/2022 10:52   MR THORACIC SPINE WO CONTRAST  Result Date: 02/03/2022 CLINICAL DATA:  Initial evaluation for bilateral lower extremity weakness. EXAM: MRI THORACIC AND LUMBAR  SPINE WITHOUT CONTRAST TECHNIQUE: Multiplanar and multiecho pulse sequences of the thoracic and lumbar spine were obtained without intravenous contrast. COMPARISON:  Comparison made with prior CT from 02/02/2022. FINDINGS: MRI THORACIC SPINE FINDINGS Alignment: Physiologic with preservation of the normal thoracic kyphosis. No listhesis. Vertebrae: Vertebral body height maintained without acute or chronic fracture. Bone marrow signal intensity within normal limits. Few scattered benign hemangiomata noted. No worrisome osseous lesions. No abnormal marrow edema. Cord:  Normal signal and morphology. Paraspinal and other soft tissues: 1.7 cm area of STIR hyperintensity noted within the subcutaneous fat of the lower thoracic spine at the level of T11 (series 17, image 14), nonspecific, but could reflect a small area of fat necrosis. Paraspinous soft tissues demonstrate no other acute finding. Asymmetric left renal atrophy partially visualized. Disc levels: T7-8: Left paracentral disc protrusion indents the left ventral thecal sac. Mild cord flattening without cord signal changes or significant stenosis. T8-9: Central disc protrusion indents the ventral thecal sac. Minimal cord flattening without cord signal changes or significant stenosis. T9-10: Broad-based central disc protrusion mildly flattens the ventral thecal sac without significant stenosis or cord impingement. Otherwise, fairly mild spondylosis seen elsewhere within the thoracic spine  for age. No other significant spinal stenosis. Foramina remain patent. No neural impingement. MRI LUMBAR SPINE FINDINGS Segmentation: Standard. Lowest well-formed disc space labeled the L5-S1 level. Alignment: Dextroscoliosis. 3 mm retrolisthesis of L2 on L3 and L3 on L4, with trace retrolisthesis of L4 on L5. Findings chronic and degenerative. Vertebrae: Vertebral body height maintained without acute or chronic fracture. Bone marrow signal intensity within normal limits. Few scattered benign hemangiomata noted. No worrisome osseous lesions. Prominent discogenic reactive endplate change with marrow edema present about the L2-3 interspace, more pronounced on the left. No other abnormal marrow edema. Conus medullaris and cauda equina: Conus extends to the L1 level. Conus and cauda equina appear normal. Paraspinal and other soft tissues: Paraspinous soft tissues demonstrate no acute finding. Asymmetric left renal atrophy with perinephric stranding, better evaluated on recent CT. Few scattered superimposed benign-appearing cysts, no follow-up imaging recommended. Disc levels: L1-2:  Minimal disc bulge.  Mild facet spurring.  No stenosis. L2-3: Disc bulge with disc desiccation and reactive endplate change, asymmetric to the left. Left greater than right facet hypertrophy. Resultant mild to moderate left lateral recess stenosis. Central canal remains patent. Mild to moderate left L2 foraminal narrowing. Right neural foramen is patent. L3-4: Disc bulge with disc desiccation and reactive endplate spurring, slightly asymmetric to the left. Mild facet and ligament flavum hypertrophy. Resultant mild canal with bilateral subarticular stenosis. Mild to moderate left L3 foraminal narrowing. Right neural foramen remains patent. L4-5: Disc bulge with reactive endplate spurring, asymmetric to the right. Moderate right with mild left facet hypertrophy. Resultant mild to moderate right lateral recess stenosis. Central canal remains  patent. Mild to moderate right L4 foraminal stenosis. Left neural foramen remains widely patent. L5-S1: Shallow broad-based central to left subarticular disc protrusion closely approximates the descending left S1 nerve root without frank neural impingement (series 8, image 29). Moderate right greater than left facet hypertrophy. No significant spinal stenosis. Foramina remain patent. IMPRESSION: MRI THORACIC SPINE IMPRESSION: 1. Normal MRI appearance of the thoracic spinal cord. No findings to explain patient's symptoms. 2. Small central disc protrusions at T7-8, T8-9, and T9-10 without significant stenosis. MRI LUMBAR SPINE IMPRESSION: 1. Normal MRI appearance of the conus medullaris and nerve roots of the cauda equina. No evidence for cord compression or other findings to explain patient's symptoms.  2. Left eccentric disc bulge with facet hypertrophy at L2-3 and L3-4 with resultant mild to moderate left lateral recess and foraminal stenosis. 3. Right eccentric disc bulge with facet hypertrophy at L4-5 with resultant mild to moderate right lateral recess and foraminal stenosis. 4. Shallow central to left subarticular disc protrusion at L5-S1, closely approximating and potentially irritating the descending left S1 nerve root. Electronically Signed   By: Jeannine Boga M.D.   On: 02/03/2022 22:41   MR LUMBAR SPINE WO CONTRAST  Result Date: 02/03/2022 CLINICAL DATA:  Initial evaluation for bilateral lower extremity weakness. EXAM: MRI THORACIC AND LUMBAR SPINE WITHOUT CONTRAST TECHNIQUE: Multiplanar and multiecho pulse sequences of the thoracic and lumbar spine were obtained without intravenous contrast. COMPARISON:  Comparison made with prior CT from 02/02/2022. FINDINGS: MRI THORACIC SPINE FINDINGS Alignment: Physiologic with preservation of the normal thoracic kyphosis. No listhesis. Vertebrae: Vertebral body height maintained without acute or chronic fracture. Bone marrow signal intensity within normal  limits. Few scattered benign hemangiomata noted. No worrisome osseous lesions. No abnormal marrow edema. Cord:  Normal signal and morphology. Paraspinal and other soft tissues: 1.7 cm area of STIR hyperintensity noted within the subcutaneous fat of the lower thoracic spine at the level of T11 (series 17, image 14), nonspecific, but could reflect a small area of fat necrosis. Paraspinous soft tissues demonstrate no other acute finding. Asymmetric left renal atrophy partially visualized. Disc levels: T7-8: Left paracentral disc protrusion indents the left ventral thecal sac. Mild cord flattening without cord signal changes or significant stenosis. T8-9: Central disc protrusion indents the ventral thecal sac. Minimal cord flattening without cord signal changes or significant stenosis. T9-10: Broad-based central disc protrusion mildly flattens the ventral thecal sac without significant stenosis or cord impingement. Otherwise, fairly mild spondylosis seen elsewhere within the thoracic spine for age. No other significant spinal stenosis. Foramina remain patent. No neural impingement. MRI LUMBAR SPINE FINDINGS Segmentation: Standard. Lowest well-formed disc space labeled the L5-S1 level. Alignment: Dextroscoliosis. 3 mm retrolisthesis of L2 on L3 and L3 on L4, with trace retrolisthesis of L4 on L5. Findings chronic and degenerative. Vertebrae: Vertebral body height maintained without acute or chronic fracture. Bone marrow signal intensity within normal limits. Few scattered benign hemangiomata noted. No worrisome osseous lesions. Prominent discogenic reactive endplate change with marrow edema present about the L2-3 interspace, more pronounced on the left. No other abnormal marrow edema. Conus medullaris and cauda equina: Conus extends to the L1 level. Conus and cauda equina appear normal. Paraspinal and other soft tissues: Paraspinous soft tissues demonstrate no acute finding. Asymmetric left renal atrophy with perinephric  stranding, better evaluated on recent CT. Few scattered superimposed benign-appearing cysts, no follow-up imaging recommended. Disc levels: L1-2:  Minimal disc bulge.  Mild facet spurring.  No stenosis. L2-3: Disc bulge with disc desiccation and reactive endplate change, asymmetric to the left. Left greater than right facet hypertrophy. Resultant mild to moderate left lateral recess stenosis. Central canal remains patent. Mild to moderate left L2 foraminal narrowing. Right neural foramen is patent. L3-4: Disc bulge with disc desiccation and reactive endplate spurring, slightly asymmetric to the left. Mild facet and ligament flavum hypertrophy. Resultant mild canal with bilateral subarticular stenosis. Mild to moderate left L3 foraminal narrowing. Right neural foramen remains patent. L4-5: Disc bulge with reactive endplate spurring, asymmetric to the right. Moderate right with mild left facet hypertrophy. Resultant mild to moderate right lateral recess stenosis. Central canal remains patent. Mild to moderate right L4 foraminal stenosis. Left neural foramen remains  widely patent. L5-S1: Shallow broad-based central to left subarticular disc protrusion closely approximates the descending left S1 nerve root without frank neural impingement (series 8, image 29). Moderate right greater than left facet hypertrophy. No significant spinal stenosis. Foramina remain patent. IMPRESSION: MRI THORACIC SPINE IMPRESSION: 1. Normal MRI appearance of the thoracic spinal cord. No findings to explain patient's symptoms. 2. Small central disc protrusions at T7-8, T8-9, and T9-10 without significant stenosis. MRI LUMBAR SPINE IMPRESSION: 1. Normal MRI appearance of the conus medullaris and nerve roots of the cauda equina. No evidence for cord compression or other findings to explain patient's symptoms. 2. Left eccentric disc bulge with facet hypertrophy at L2-3 and L3-4 with resultant mild to moderate left lateral recess and foraminal  stenosis. 3. Right eccentric disc bulge with facet hypertrophy at L4-5 with resultant mild to moderate right lateral recess and foraminal stenosis. 4. Shallow central to left subarticular disc protrusion at L5-S1, closely approximating and potentially irritating the descending left S1 nerve root. Electronically Signed   By: Jeannine Boga M.D.   On: 02/03/2022 22:41   CT CHEST ABDOMEN PELVIS W CONTRAST  Result Date: 02/02/2022 CLINICAL DATA:  Polymyositis.  Metastatic disease assessment EXAM: CT CHEST, ABDOMEN, AND PELVIS WITH CONTRAST TECHNIQUE: Multidetector CT imaging of the chest, abdomen and pelvis was performed following the standard protocol during bolus administration of intravenous contrast. RADIATION DOSE REDUCTION: This exam was performed according to the departmental dose-optimization program which includes automated exposure control, adjustment of the mA and/or kV according to patient size and/or use of iterative reconstruction technique. CONTRAST:  124m OMNIPAQUE IOHEXOL 300 MG/ML  SOLN COMPARISON:  CT abdomen pelvis 05/05/2015, CT chest 08/26/2011 FINDINGS: CT CHEST FINDINGS Cardiovascular: Extensive coronary artery calcification, asymmetrically more severe within the proximal right coronary artery. Aortic valve leaflet calcification noted. Global cardiac size is within normal limits. No pericardial effusion. Central pulmonary arteries are of normal caliber. Mild atherosclerotic calcification within the thoracic aorta. No aortic aneurysm. Mediastinum/Nodes: No enlarged mediastinal, hilar, or axillary lymph nodes. Thyroid gland, trachea, and esophagus demonstrate no significant findings. Lungs/Pleura: 6 mm noncalcified subpleural pulmonary nodule within the right upper lobe, axial image # 66/4 appears stable since prior examination and is safely considered benign. Lungs are otherwise clear. No pneumothorax or pleural effusion. Central airways are widely patent. Musculoskeletal: Remote right  fifth rib fracture anteriorly. No acute bone abnormality. No lytic or blastic bone lesion. CT ABDOMEN PELVIS FINDINGS Hepatobiliary: No focal liver abnormality is seen. No gallstones, gallbladder wall thickening, or biliary dilatation. Pancreas: Unremarkable Spleen: Unremarkable Adrenals/Urinary Tract: The adrenal glands are unremarkable. The kidneys are normal in position. Right kidney is normal in size. No hydronephrosis on the right. No intrarenal or ureteral calculi on the right. There is moderate left renal cortical atrophy, asymmetrically involving the lower pole, similar to prior examination. No hydronephrosis. Stone burden has decreased, particularly inferiorly though multiple residual nonobstructing calculi are identified measuring up to 10 mm within the lower pole of the left kidney. Trace perinephric fluid on the left noted, possibly the residua of prior intervention. No ureteral calculi. Bladder unremarkable. Stomach/Bowel: The stomach, small bowel, and large bowel are unremarkable. Appendix absent. No free intraperitoneal gas or fluid. Vascular/Lymphatic: Aortic atherosclerosis. No enlarged abdominal or pelvic lymph nodes. Reproductive: Prostate is unremarkable. Other: No abdominal wall hernia Musculoskeletal: No acute bone abnormality. No lytic or blastic bone lesion. IMPRESSION: No evidence of metastatic disease within the chest, abdomen, and pelvis. No acute intrathoracic or intra-abdominal pathology identified. Extensive coronary artery  calcification. Aortic valve leaflet calcification noted. Echocardiography may be more helpful to assess the degree of valvular dysfunction. 6 mm right upper lobe pulmonary nodule, stable since remote prior examination and safely considered benign. Moderate nonobstructing left nephrolithiasis with decreased stone burden since prior examination of 05/05/2015. Moderate cortical atrophy. Small left perinephric fluid collection, possibly the residua of prior intervention.  Aortic Atherosclerosis (ICD10-I70.0). Electronically Signed   By: Fidela Salisbury M.D.   On: 02/02/2022 22:58   ECHOCARDIOGRAM LIMITED BUBBLE STUDY  Result Date: 02/04/2022    ECHOCARDIOGRAM LIMITED REPORT   Patient Name:   [name] Date of Exam: 02/04/2022 Medical Rec #:  [id number]      Height:       70.0 in Accession #:    [id number]     Weight:       183.4 lb Date of Birth:  12/18/1955      BSA:          2.012 m Patient Age:    36 years       BP:           136/66 mmHg Patient Gender: M              HR:           74 bpm. Exam Location:  Inpatient Procedure: Limited Echo, Saline Contrast Bubble Study, Color Doppler and Cardiac            Doppler Indications:     stroke  History:         Patient has prior history of Echocardiogram examinations, most                  recent 12/28/2021. CAD; Risk Factors:Diabetes and Dyslipidemia.  Sonographer:     Johny Chess RDCS Referring Phys:  [id number] Patrecia Pour STACK Diagnosing Phys: [place] IMPRESSIONS  1. Left ventricular ejection fraction, by estimation, is 60 to 65%. The left ventricle has normal function. Left ventricular diastolic parameters are consistent with Grade I diastolic dysfunction (impaired relaxation).  2. Right ventricular systolic function is normal. The right ventricular size is normal.  3. No evidence of mitral valve regurgitation.  4. Aortic valve regurgitation is not visualized. Mild aortic valve stenosis.  5. The inferior vena cava is normal in size with greater than 50% respiratory variability, suggesting right atrial pressure of 3 mmHg.  6. Agitated saline contrast bubble study was negative, with no evidence of any interatrial shunt. Conclusion(s)/Recommendation(s): No significant interatrial shunt was identified. FINDINGS  Left Ventricle: Left ventricular ejection fraction, by estimation, is 60 to 65%. The left ventricle has normal function. Left ventricular diastolic parameters are consistent with Grade I diastolic dysfunction (impaired  relaxation). Right Ventricle: The right ventricular size is normal. Right ventricular systolic function is normal. Mitral Valve: There is mild calcification of the mitral valve leaflet(s). Tricuspid Valve: Tricuspid valve regurgitation is not demonstrated. Aortic Valve: Aortic valve regurgitation is not visualized. Mild aortic stenosis is present. Aortic valve mean gradient measures 11.0 mmHg. Aortic valve peak gradient measures 19.9 mmHg. Pulmonic Valve: Pulmonic valve regurgitation is not visualized. Venous: The inferior vena cava is normal in size with greater than 50% respiratory variability, suggesting right atrial pressure of 3 mmHg. IAS/Shunts: No atrial level shunt detected by color flow Doppler. Agitated saline contrast was given intravenously to evaluate for intracardiac shunting. Agitated saline contrast bubble study was negative, with no evidence of any interatrial shunt.  Diastology LV e' medial:    5.55 cm/s LV  E/e' medial:  14.1 LV e' lateral:   8.70 cm/s LV E/e' lateral: 9.0  IVC IVC diam: 1.70 cm AORTIC VALVE AV Vmax:           223.00 cm/s AV Vmean:          152.000 cm/s AV VTI:            0.426 m AV Peak Grad:      19.9 mmHg AV Mean Grad:      11.0 mmHg LVOT Vmax:         98.50 cm/s LVOT Vmean:        64.300 cm/s LVOT VTI:          0.193 m LVOT/AV VTI ratio: 0.45 MITRAL VALVE MV Area (PHT): 3.48 cm     SHUNTS MV Decel Time: 218 msec     Systemic VTI: 0.19 m MV E velocity: 78.40 cm/s MV A velocity: 106.00 cm/s MV E/A ratio:  0.74 Mary Scientist, physiological signed by Phineas Inches Signature Date/Time: 02/04/2022/1:13:55 PM    Final (Updated)    VAS US CAROTID (at [facility] and [facility] only)  Result Date: 02/04/2022 Carotid Arterial Duplex Study Patient Name:  [name]  Date of Exam:   02/04/2022 Medical Rec #: [id number]       Accession #:    [id number] Date of Birth: 06-Dec-1955       Patient Gender: M Patient Age:   30 years Exam Location:  [facility] Procedure:      VAS US CAROTID Referring Phys:  JEFFREY MCCLUNG --------------------------------------------------------------------------------  Indications:       CVA. Risk Factors:      Hyperlipidemia, Diabetes. Comparison Study:  No prior studies. Performing Technologist: Oliver Hum RVT  Examination Guidelines: A complete evaluation includes B-mode imaging, spectral Doppler, color Doppler, and power Doppler as needed of all accessible portions of each vessel. Bilateral testing is considered an integral part of a complete examination. Limited examinations for reoccurring indications may be performed as noted.  Right Carotid Findings: +----------+--------+--------+--------+-----------------------+--------+           PSV cm/sEDV cm/sStenosisPlaque Description     Comments +----------+--------+--------+--------+-----------------------+--------+ CCA Prox  128     15              smooth and heterogenous         +----------+--------+--------+--------+-----------------------+--------+ CCA Distal112     27              smooth and heterogenous         +----------+--------+--------+--------+-----------------------+--------+ ICA Prox  117     33              calcific                        +----------+--------+--------+--------+-----------------------+--------+ ICA Distal86      26                                              +----------+--------+--------+--------+-----------------------+--------+ ECA       216     22                                              +----------+--------+--------+--------+-----------------------+--------+ +----------+--------+-------+--------+-------------------+  PSV cm/sEDV cmsDescribeArm Pressure (mmHG) +----------+--------+-------+--------+-------------------+ Subclavian215                                        +----------+--------+-------+--------+-------------------+ +---------+--------+--+--------+--+---------+ VertebralPSV cm/s59EDV cm/s13Antegrade  +---------+--------+--+--------+--+---------+  Left Carotid Findings: +----------+--------+--------+--------+-----------------------+--------+           PSV cm/sEDV cm/sStenosisPlaque Description     Comments +----------+--------+--------+--------+-----------------------+--------+ CCA Prox  138     23              smooth and heterogenous         +----------+--------+--------+--------+-----------------------+--------+ CCA Distal125     21              smooth and heterogenous         +----------+--------+--------+--------+-----------------------+--------+ ICA Prox  106     29              smooth and heterogenous         +----------+--------+--------+--------+-----------------------+--------+ ICA Distal118     32                                     tortuous +----------+--------+--------+--------+-----------------------+--------+ ECA       153                                                     +----------+--------+--------+--------+-----------------------+--------+ +----------+--------+--------+--------+-------------------+           PSV cm/sEDV cm/sDescribeArm Pressure (mmHG) +----------+--------+--------+--------+-------------------+ [id number]                                         +----------+--------+--------+--------+-------------------+ +---------+--------+--+--------+--+---------+ VertebralPSV cm/s54EDV cm/s18Antegrade +---------+--------+--+--------+--+---------+   Summary: Right Carotid: Velocities in the right ICA are consistent with a 1-39% stenosis. Left Carotid: Velocities in the left ICA are consistent with a 1-39% stenosis. Vertebrals: Bilateral vertebral arteries demonstrate antegrade flow. *See table(s) above for measurements and observations.  Electronically signed by Antony Contras MD on 02/04/2022 at 11:32:16 AM.    Final     Labs:  Basic Metabolic Panel: Recent Labs  Lab 02/14/22 0624  NA 139  K 3.9  CL 104  CO2 27  GLUCOSE  69*  BUN 27*  CREATININE 0.49*  CALCIUM 8.9    CBC: Recent Labs  Lab 02/14/22 0624  WBC 21.7*  HGB 13.7  HCT 39.3  MCV 92.9  PLT 292    CBG: Recent Labs  Lab 02/15/22 0552 02/15/22 1202 02/15/22 1619 02/15/22 2126 02/16/22 0621  GLUCAP 74 180* 198* 227* 88   Family history.  Mother with Alzheimer's disease father with myocardial infarction  Brief HPI:   [name] is a 66 y.o. right-handed male with history of type 2 diabetes mellitus hypertension intolerance of statin due to abnormal LFTs (off for the past years), CAD status post DES to RCA 12/28/2021 (addition of arterial for statin 20 mg, aspirin/Brilinta), reports 30 pound unintentional weight loss over the past year with progressive bilateral upper lower extremity weakness over the past 4 to 5 months.  Since starting statins again, he started having near falls, significant worsening of weakness and presented to  the ED for work-up on 02/03/2022.  MRI of the brain done revealing acute right pontine infarct and felt to be incidental.  CTA head and neck was negative for LVO but showed extensive atherosclerosis, 70% right ICA stenosis 50% left ICA stenosis, 50% right VA and 40% left VA stenosis.  MRI of the spine showed cervical thoracic and lumbar spine negative for cord compression or inflammatory changes.  CT abdomen/pelvis was negative for metastatic disease, intra-abdominal or intrathoracic pathology with moderate nonobstructive left nephrolithiasis with moderate cortical atrophy.  2D echo bubble study was negative for IA shunt. Neurology consulted felt symptoms concerning for myopathy-CK elevated 7291.  He was treated with a 3-day course of Solu-Medrol as well as IV fluids for hydration.  General surgery consulted for muscle biopsy but would need to be off Brilinta for procedure.  Cardiology recommended waiting at least 3 months due to need for DAPT secondary to recent drug-eluting stent and planned biopsy 3 to 4 months.  HMG/COA  reductase antibodies ordered as well as recommendations for muscle biopsy.  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  Also rate of improvement since steroids initiated also favors inflammatory myopathy.  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  Abnormal LFTs and CK trending down (1195 today).  He completed 3-day course of Solu-Medrol and strength felt to be plateaued-transition to prednisone 80 mg daily.  Patient continue be limited by weakness legs gave out with nursing, increased unsteadiness and admitted for a comprehensive rehab program.   Hospital Course: [name] was admitted to rehab 02/08/2022 for inpatient therapies to consist of PT, ST and OT at least three hours five days a week. Past admission physiatrist, therapy team and rehab RN have worked together to provide customized collaborative inpatient rehab.  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  Prednisone taper as directed.  He continued on aspirin and Brilinta as advised no bleeding episodes.  Subcutaneous Lovenox for DVT prophylaxis.  Mood stabilization with Celexa and emotional support provided.  History of CAD status post DES to RCA again noted aspirin and Brilinta.  Blood sugars overall monitored while on high-dose steroids and he presently continues with his Glucophage as well as Lantus insulin.  He could resume his Actos as an outpatient after follow-up with PCP.  Blood pressure control with Norvasc lisinopril and Coreg he will need outpatient follow-up.  Prerenal azotemia encouragement of fluids with latest creatinine 0.49.   Blood pressures were monitored on TID basis and soft and monitored  Diabetes has been monitored with ac/hs CBG checks and  SSI was use prn for tighter BS control.    Rehab course: During patient's stay in rehab weekly team conferences were held to monitor patient's progress, set goals and discuss barriers to discharge. At admission, patient required minimal guard sit to stand minimal guard 200 feet rolling walker  Physical exam.  Blood pressure 137/76 pulse 76 temperature 97.8 respirations 18 oxygen saturation 95% room air Constitutional.  No acute distress HEENT Head.  Normocephalic and atraumatic Eyes.  Pupils round and reactive to light no discharge without nystagmus Neck.  Supple nontender no JVD without thyromegaly Cardiac regular rate rhythm any extra sounds or murmur heard Abdomen.  Soft nontender positive bowel sounds without rebound  Skin.  Clean and dry Neurologic.  Alert oriented x3 speech fluent follows commands.  Sensation intact all fours Musculoskeletal.  Strength right upper and left upper extremity 3/5 at shoulder flexion, 4/5 elbow flexion and extension, 4+/5 finger flexion and fifth digit abduction Right lower extremity left lower extremity hip flexion 3/5, knee extension 4/5, 4+/5 ankle dorsi plantarflexion Normal tone Right TKR incision well-healed  He/She  has had improvement in activity tolerance, balance, postural control as well as ability to compensate for deficits. He/She has had improvement in functional use RUE/LUE  and RLE/LLE as well as improvement in awareness.  Sit to stand rolling walker modified independent.  Ambulates to elevator navigate with cues ambulates 1500 feet with cues for safety.  He did require some rest breaks.  Modified independent in his room.  Bilateral upper extremity/lower body strengthening for ADL participation contact-guard assist.  Full family teaching completed plan discharged to home       Disposition: Discharge to home    Diet: Diabetic diet  Special Instructions: No driving smoking or alcohol  Plan eventual muscle biopsy in the next 3 months  per neurology services  Medications at discharge 1.  Tylenol as needed 2.  Norvasc 10 mg p.o. daily 3.  Aspirin 81 mg p.o. daily 4.  Coreg 12.5 mg p.o. twice daily with meals 5.  Vitamin D 1000 units p.o. daily 6.  Celexa 20 mg p.o. daily 7.  Flexeril 10 mg p.o. nightly 8.  Lantus 12 units twice daily 9.  Lisinopril 40 mg p.o. every morning 10.  Glucophage 1000 mg p.o. twice daily 11.  Multivitamin daily 12.  Nitroglycerin as needed chest pain 13.  Brilinta 90 mg p.o. twice daily 14.  Prednisone taper as directed 15.  Vitamin B12 2500 mcg sublingual daily 16.  Voltaren gel 2 g 4 times daily  30-35 minutes were spent completing discharge summary and discharge planning  Discharge Instructions     Ambulatory referral to Neurology   Complete by: As directed    An appointment is requested in approximately: 4 weeks inflammatory myositis        Follow-up Information     Kirsteins, Luanna Salk, MD Follow up.   Specialty: Physical Medicine and Rehabilitation Why: No formal follow up needed Contact information: [place] [address] [phone]         Werner Lean, MD Follow up.   Specialty: Cardiology Why: Call for appointment Contact information: [place] [address] [phone]                 Signed: Lavon Paganini Benzie 02/16/2022, 10:40 AM

## 2022-02-14 NOTE — Progress Notes (Signed)
Patient ID: [name], male   DOB: November 18, 1955, 66 y.o.   MRN: [id number]  Rollator and Tub Transfer Bench ordered through [facility].

## 2022-02-14 NOTE — Progress Notes (Signed)
Occupational Therapy Session Note  Patient Details  Name: [name] MRN: [id number] Date of Birth: 1956-02-10  Today's Date: 02/14/2022 OT Individual Time: 1001-1045 session 1 OT Individual Time Calculation (min): 44 min  Session 2: 1305-1415 ( 70 mins)  Short Term Goals: Week 1:  OT Short Term Goal 1 (Week 1): STGs=LTGs  Skilled Therapeutic Interventions/Progress Updates:  Session 1: Pt greeted seated in recliner agreeable to OT intervention. Session focus on  functional mobility, core strength, increasing overall activity tolerance, BUE/LB strengthening for ADL participation and decreasing overall caregiver burden.    Pt completed functional ambulation from room to gym with supervision with rollator. Pt reports increased soreness in UB/LB, worked on seated stretches from rollator seat as indicated below for pain mgmt.                                    - horizontal ADD shoulder stretch  -shoulder shrugs  - internal/external shoulder rotation - hamstring stretches with BLEs extended on chair parallel to pt. Pt completed X10 sit>stands with small ball placed in between inner thighs to promote increased control/strength in inner thighs. Pt needed CGA for sit>stand and needed to use BUEs to push to stand. Pt also completed below core strengthening exercies from EOM: 1 min of russian twists with 3 lb weighted ball  1 min of modified crunches leaning back on wedge holding 4lb ball  Pt completed 10 mins on nustep at workload of 5 to increase overall strength and endurance for ADL participation. Pt ambulated back to room with rollator with supervision, pt left seated in recliner with all needs within reach.   Session 2:  Pt greeted  seated in recliner  agreeable to OT intervention, no pain reported during session. Pt ambulated to gym with rollator and supervision. Pt completed 5 mins on scifit forward/backward to increase overall activity tolerance and BUE strength and endurance. Pt  completed dynamic balance challenge while standing on airex cushion at [facility] where pt instructed to  Sequence tapping letters on screen with an emphasis on reaching out of BOS and returning to midline to promote improved balance during ADL tasks. Pt completed task with CGA with no UE support and  93.33 % accuracy with a 1.84 sec reaction time. Pt completed additional dynamic balance task using BITS while standing on airex cushion where pt completed  Trail making task in 1 min with 2 errors and no LOB.  Also worked on side stepping to promote improved independence with dynamic gait training for functional ambulation pt completed Side steps while holding onto railing and stepping over yoga blocks to R and L side. Graded task up and had pt completed same was with no UE support, pt needed MIN A for balance.  Pt completed below standing/supine LB therex as well as core exercises to promote increased LB strength/ trunk control for ADL tolerance.  -2x10 standing hip ABD/ADD with and without resistance band around quads -[place] with and without band  Supine exercises: X10 hip bridges X10 hip bridges with small ball placed at inner thighs to facilitate improved inner thigh control       2x10 upper crunches with pt instructed to reach [facility] to tap ball to target rectus abdominis strength 2x10 rope climbs to target rectus abdominis strength Oblique crunches from seated EOM with pt instructed to roll small ball behind pt from diagonally 3x 10 reps  Lastly pt reported  that he bought a [employer] school teacher to facilitate energy conservation at home and decrease risk of falls, pt able to ambulate around gym using reacher to collect items from floor level with distant supervision. Pt ambulated back to room with rollator with supervision where pt left seated in recliner.   Therapy Documentation Precautions:  Precautions Precautions: Fall, Other (comment) Precaution Comments: proximal muscle weakness Restrictions Weight  Bearing Restrictions: No  Therapy/Group: Individual Therapy  [name] 02/14/2022, 12:14 PM

## 2022-02-15 LAB — GLUCOSE, CAPILLARY
Glucose-Capillary: 74 mg/dL (ref 70–99)
Glucose-Capillary: 180 mg/dL — ABNORMAL HIGH (ref 70–99)
Glucose-Capillary: 198 mg/dL — ABNORMAL HIGH (ref 70–99)
Glucose-Capillary: 227 mg/dL — ABNORMAL HIGH (ref 70–99)

## 2022-02-15 LAB — CK: Total CK: 991 U/L — ABNORMAL HIGH (ref 49–397)

## 2022-02-15 MED ORDER — INSULIN GLARGINE-YFGN 100 UNIT/ML ~~LOC~~ SOLN
12.0000 [IU] | Freq: Two times a day (BID) | SUBCUTANEOUS | Status: DC
  Administered 2022-02-15 – 2022-02-16 (×3): 12 [IU] via SUBCUTANEOUS
  Filled 2022-02-15 (×3): qty 0.12

## 2022-02-15 MED ORDER — INSULIN ASPART 100 UNIT/ML IJ SOLN
7.0000 [IU] | Freq: Three times a day (TID) | INTRAMUSCULAR | Status: DC
  Administered 2022-02-15 – 2022-02-16 (×3): 7 [IU] via SUBCUTANEOUS

## 2022-02-15 NOTE — Progress Notes (Signed)
Patient ID: [name], male   DOB: Apr 04, 1956, 66 y.o.   MRN: [id number]  Patient DME delivered by [employer].

## 2022-02-15 NOTE — Progress Notes (Signed)
Occupational Therapy Discharge Summary  Patient Details  Name: [name] MRN: [id number] Date of Birth: Apr 20, 1956  Today's Date: 02/15/2022   Patient has met 7 of 7 long term goals due to improved activity tolerance, improved balance, postural control, ability to compensate for deficits, functional use of  RIGHT upper and LEFT upper extremity, and improved coordination.  Pt has made excellent progress during LOS d/t improvements in balance, strength, coordination, and activity tolerance. Pt overall MOD I for ADLS and functional mobility with rollator. Pts family was not present during sessions however pt able to educate family members on level of care, pt will DC home with his father and sisters who can provide 24/7 support. Patient to discharge at overall Modified Independent level.  Patient's care partner is independent to provide the necessary physical assistance at discharge.    Reasons goals not met: NA  Recommendation:  Patient will benefit from ongoing skilled OT services in outpatient setting to continue to advance functional skills in the area of BADL and Reduce care partner burden.  Equipment: TTB  Reasons for discharge: treatment goals met and discharge from hospital  Patient/family agrees with progress made and goals achieved: Yes  OT Discharge    Vital Signs Therapy Vitals Temp: 97.7 F (36.5 C) Temp Source: Oral Pulse Rate: 86 Resp: 18 BP: 126/83 Oxygen Therapy SpO2: 95 % O2 Device: Room Air Pain Pain Assessment Pain Scale: 0-10 Pain Score: 0-No pain ADL ADL Equipment Provided: Reacher Eating: Independent Grooming: Independent Where Assessed-Grooming: Standing at sink Upper Body Bathing: Modified independent Where Assessed-Upper Body Bathing: Shower Lower Body Bathing: Modified independent Where Assessed-Lower Body Bathing: Shower Upper Body Dressing: Modified independent (Device) Where Assessed-Upper Body Dressing: Edge of bed Lower Body  Dressing: Modified independent Where Assessed-Lower Body Dressing: Edge of bed Toileting: Modified independent Toilet Transfer: Modified independent Armed forces technical officer Method: Ambulating Tub/Shower Transfer: Modified independent Tub/Shower Transfer Method: Ambulating ADL Comments: overall MOD I with rollator for ADLS Vision Baseline Vision/History: 1 Wears glasses Patient Visual Report: No change from baseline Vision Assessment?: No apparent visual deficits Perception  Perception: Within Functional Limits Praxis Praxis: Intact Cognition Cognition Overall Cognitive Status: Within Functional Limits for tasks assessed Arousal/Alertness: Awake/alert Orientation Level: Person;Place;Situation Person: Oriented Place: Oriented Situation: Oriented Memory: Appears intact Attention: Focused;Sustained;Selective Focused Attention: Appears intact Sustained Attention: Appears intact Selective Attention: Appears intact Awareness: Appears intact Problem Solving: Appears intact Executive Function: Sequencing;Organizing Sequencing: Appears intact Organizing: Appears intact Safety/Judgment: Appears intact Brief Interview for Mental Status (BIMS) Repetition of Three Words (First Attempt): 3 Temporal Orientation: Year: Correct Temporal Orientation: Month: Accurate within 5 days Temporal Orientation: Day: Correct Recall: "Sock": Yes, no cue required Recall: "Blue": Yes, no cue required Recall: "Bed": Yes, no cue required BIMS Summary Score: 15 Sensation Sensation Light Touch: Appears Intact Hot/Cold: Appears Intact Proprioception: Appears Intact Stereognosis: Not tested Coordination Gross Motor Movements are Fluid and Coordinated: No Fine Motor Movements are Fluid and Coordinated: Yes Coordination and Movement Description: core and proximal B UE and B LE weakness with generalized deconditioning Motor  Motor Motor: Other (comment) Motor - Discharge Observations: continues to have core  and proximal B UE and B LE weakness with generalized deconditioning that is improving since initial evaluation Mobility  Bed Mobility Bed Mobility: Supine to Sit;Sit to Supine Supine to Sit: Independent with assistive device Sit to Supine: Independent with assistive device Transfers Sit to Stand: Independent with assistive device Stand to Sit: Independent with assistive device  Trunk/Postural Assessment  Cervical Assessment Cervical Assessment:  Within Functional Limits Thoracic Assessment Thoracic Assessment: Exceptions to [facility] (thoracic rounding) Lumbar Assessment Lumbar Assessment: Exceptions to [facility] (psoterior pelvic tilt) Postural Control Postural Control: Deficits on evaluation Trunk Control: core weakness for high level dynaimc sitting balance tasks  Balance Balance Balance Assessed: Yes Standardized Balance Assessment Standardized Balance Assessment: Functional Gait Assessment Static Sitting Balance Static Sitting - Balance Support: Feet supported;No upper extremity supported Static Sitting - Level of Assistance: 6: Modified independent (Device/Increase time) Dynamic Sitting Balance Dynamic Sitting - Balance Support: During functional activity Dynamic Sitting - Level of Assistance: 6: Modified independent (Device/Increase time) Static Standing Balance Static Standing - Balance Support: During functional activity;Bilateral upper extremity supported Static Standing - Level of Assistance: 6: Modified independent (Device/Increase time) Dynamic Standing Balance Dynamic Standing - Balance Support: During functional activity;Bilateral upper extremity supported Dynamic Standing - Level of Assistance: 6: Modified independent (Device/Increase time);5: Stand by assistance Functional Gait  Assessment Gait Level Surface: Walks 20 ft in less than 7 sec but greater than 5.5 sec, uses assistive device, slower speed, mild gait deviations, or deviates 6-10 in outside of the 12 in walkway  width. Change in Gait Speed: Able to change speed, demonstrates mild gait deviations, deviates 6-10 in outside of the 12 in walkway width, or no gait deviations, unable to achieve a major change in velocity, or uses a change in velocity, or uses an assistive device. Gait with Horizontal Head Turns: Performs head turns smoothly with slight change in gait velocity (eg, minor disruption to smooth gait path), deviates 6-10 in outside 12 in walkway width, or uses an assistive device. Gait with Vertical Head Turns: Performs task with moderate change in gait velocity, slows down, deviates 10-15 in outside 12 in walkway width but recovers, can continue to walk. Gait and Pivot Turn: Turns slowly, requires verbal cueing, or requires several small steps to catch balance following turn and stop Step Over Obstacle: Cannot perform without assistance. Gait with Narrow Base of Support: Ambulates less than 4 steps heel to toe or cannot perform without assistance. Gait with Eyes Closed: Cannot walk 20 ft without assistance, severe gait deviations or imbalance, deviates greater than 15 in outside 12 in walkway width or will not attempt task. Ambulating Backwards: Walks 20 ft, uses assistive device, slower speed, mild gait deviations, deviates 6-10 in outside 12 in walkway width. Steps: Alternating feet, must use rail. Total Score: 12 Extremity/Trunk Assessment RUE Assessment RUE Assessment: Exceptions to [facility] Passive Range of Motion (PROM) Comments: WFL slow d/t pain from rotator cuff tear Active Range of Motion (AROM) Comments: WFL slow d/t pain from rotator cuff tear General Strength Comments: 3-/5 shoulder flexion/abduction, 4-/5 elbow extension; WNL distally LUE Assessment LUE Assessment: Exceptions to [facility] Passive Range of Motion (PROM) Comments: WFL Active Range of Motion (AROM) Comments: WFL General Strength Comments: 3-/5 shoulder flexion/abduction, 3+/5 elbow extension; WNL distally   [name] 02/15/2022, 8:12 AM

## 2022-02-15 NOTE — Progress Notes (Signed)
PROGRESS NOTE   Subjective/Complaints:  Pt asking about techniques to get off the floor in case of fall.  Mod I in room today with RW   ROS- neg CP, SOB, N/V/D  Objective:   No results found. Recent Labs    02/14/22 0624  WBC 21.7*  HGB 13.7  HCT 39.3  PLT 292   Recent Labs    02/14/22 0624  NA 139  K 3.9  CL 104  CO2 27  GLUCOSE 69*  BUN 27*  CREATININE 0.49*  CALCIUM 8.9    Intake/Output Summary (Last 24 hours) at 02/15/2022 0748 Last data filed at 02/14/2022 1805 Gross per 24 hour  Intake 480 ml  Output --  Net 480 ml         Physical Exam: Vital Signs Blood pressure 130/68, pulse 69, temperature 97.7 F (36.5 C), temperature source Oral, resp. rate 18, height '5\' 10"'$  (1.778 m), weight 81.6 kg, SpO2 95 %.   General: No acute distress Mood and affect are appropriate Heart: Regular rate and rhythm no rubs murmurs or extra sounds Lungs: Clear to auscultation, breathing unlabored, no rales or wheezes Abdomen: Positive bowel sounds, soft nontender to palpation, nondistended Extremities: No clubbing, cyanosis, or edema Skin: No evidence of breakdown, no evidence of rash Neurologic: Cranial nerves II through XII intact, motor strength is 3-/5 in bilateral deltoid, 4- bicep, tricep,5/5 grip, 3- hip flexor, 4/5 knee extensors, 5/5 ankle dorsiflexor and plantar flexor Musculoskeletal: Full range of motion in all 4 extremities. No joint swelling. Needs to push on hands to go from sitting to standing.  Physical exam same as 02/14/22 Assessment/Plan: 1. Functional deficits which require 3+ hours per day of interdisciplinary therapy in a comprehensive inpatient rehab setting. Physiatrist is providing close team supervision and 24 hour management of active medical problems listed below. Physiatrist and rehab team continue to assess barriers to discharge/monitor patient progress toward functional and medical  goals  Care Tool:  Bathing    Body parts bathed by patient: Right arm, Left arm, Chest, Abdomen, Front perineal area, Buttocks, Right upper leg, Left upper leg, Right lower leg, Left lower leg, Face   Body parts bathed by helper:  (asisted to wash hair)     Bathing assist Assist Level: Minimal Assistance - Patient > 75%     Upper Body Dressing/Undressing Upper body dressing   What is the patient wearing?: Pull over shirt    Upper body assist Assist Level: Set up assist    Lower Body Dressing/Undressing Lower body dressing      What is the patient wearing?: Pants, Underwear/pull up     Lower body assist Assist for lower body dressing: Minimal Assistance - Patient > 75%     Toileting Toileting    Toileting assist Assist for toileting: Supervision/Verbal cueing     Transfers Chair/bed transfer  Transfers assist     Chair/bed transfer assist level: Supervision/Verbal cueing Chair/bed transfer assistive device: Programmer, multimedia   Ambulation assist      Assist level: Contact Guard/Touching assist Assistive device: Walker-rolling Max distance: 280f   Walk 10 feet activity   Assist     Assist level:  Contact Guard/Touching assist Assistive device: Walker-rolling   Walk 50 feet activity   Assist    Assist level: Contact Guard/Touching assist Assistive device: Walker-rolling    Walk 150 feet activity   Assist    Assist level: Contact Guard/Touching assist Assistive device: Walker-rolling    Walk 10 feet on uneven surface  activity   Assist     Assist level: Minimal Assistance - Patient > 75% Assistive device: Other (comment) (railing)   Wheelchair     Assist Is the patient using a wheelchair?: No             Wheelchair 50 feet with 2 turns activity    Assist            Wheelchair 150 feet activity     Assist          Blood pressure 130/68, pulse 69, temperature 97.7 F (36.5 C),  temperature source Oral, resp. rate 18, height '5\' 10"'$  (1.778 m), weight 81.6 kg, SpO2 95 %.    Medical Problem List and Plan: 1. Functional deficits secondary Inflammatory myositis             -Statin induced myopathy also in differential             -patient may  shower- pt was able to shower with supervision              -ELOS/Goals: home in am , mod I in room , practice floor transfers today , HEP    2.  Antithrombotics: -DVT/anticoagulation:  Pharmaceutical: Lovenox             -antiplatelet therapy: ASA/Brilinta 3. Pain Management: Tylenol prn.  4. Mood: LCSW to follow for evaluation and support.              -antipsychotic agents: N/A 5. Neuropsych: This patient is capable of making decisions on his own behalf. 6. Skin/Wound Care: Routine pressure relief measures.  7. Fluids/Electrolytes/Nutrition: Monitor I/O. Check CMET/CK in am.    Latest Ref Rng & Units 02/14/2022    6:24 AM 02/09/2022    6:19 AM 02/08/2022    1:41 AM  BMP  Glucose 70 - 99 mg/dL 69   98   239    BUN 8 - 23 mg/dL '27   25   29    '$ Creatinine 0.61 - 1.24 mg/dL 0.49   0.44   0.57    Sodium 135 - 145 mmol/L 139   139   137    Potassium 3.5 - 5.1 mmol/L 3.9   4.0   4.4    Chloride 98 - 111 mmol/L 104   104   104    CO2 22 - 32 mmol/L '27   27   24    '$ Calcium 8.9 - 10.3 mg/dL 8.9   9.3   9.0      8.  Inflammatory myositis v/s statin induced myositis:CK stabilizing at 2000 no muscle pain or renal abnormalities              -Outpatient muscle biopsy in about 3-4 months             -Prednisone '80mg'$  PO qd            -5/21 Continue prednisone 80 mg qd, with taper, next decrease to 70 mg on Tuesday 5/23 9. CAD s/p DES to RCA: Monitor for symptoms with increase in activity.              --on  ASA, Brilinta, coreg and lisinopril --was to start Repatha PTA.               -Denies Chest pain 10. T2DM: Hgb A1c-7.4. Monitor BS ac/hs and use SSI for elevated BS.  --Was on metformin 1000 mg bid, Lantus 30 u/hs and Actos 30 mg  PTA.              --Blood sugars poorly controlled likely due to solumedrol--now on 80 mg daily --Anticipate improvement with increase in activity and question taper.             --Continue insulin glargine bid and resume metformin for better control.  CBG (last 3)  Recent Labs    02/14/22 1637 02/14/22 2036 02/15/22 0552  GLUCAP 178* 274* 74   Low CBG this am  Reduce semglee increase meal coverage  Morning CBG still on low side but will cont current Semglee and enc hs snack , needs to go back to 6U Novalog for daytime elevation  11. HTN: Monitor BP TID--continue Norvasc, Lisinopril and coreg.             Overall well controlled, continue to monitor Controlled 5/22    02/15/2022    5:53 AM 02/14/2022    7:26 PM 02/14/2022    2:23 PM  Vitals with BMI  Systolic 161 096 045  Diastolic 68 74 69  Pulse 69 82 83    12.  Pre-renal azotemia: Encourage fluid intake.  --Recheck BUN/SCr improving  5/20 last BUN 25 and Crt 0.44 (5/17) continue to trend qMonday labs.    Latest Ref Rng & Units 02/14/2022    6:24 AM 02/09/2022    6:19 AM 02/08/2022    1:41 AM  BMP  Glucose 70 - 99 mg/dL 69   98   239    BUN 8 - 23 mg/dL '27   25   29    '$ Creatinine 0.61 - 1.24 mg/dL 0.49   0.44   0.57    Sodium 135 - 145 mmol/L 139   139   137    Potassium 3.5 - 5.1 mmol/L 3.9   4.0   4.4    Chloride 98 - 111 mmol/L 104   104   104    CO2 22 - 32 mmol/L '27   27   24    '$ Calcium 8.9 - 10.3 mg/dL 8.9   9.3   9.0      13. Abnormal LFTs: Resolving. Recheck in am.  14. Hypokalemia: Mg- 2.1 and WNL. Likely due to steroids.  --Continue Kdur 40 meq daily for supplement.  5/21 corrected, continue to monitor. 15.  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  Leukocytosis due to steroids afebrile   5/23 Likely reactive d/t steroids, no other s/s of infection    Latest Ref Rng & Units 02/14/2022    6:24 AM 02/09/2022    6:19 AM 02/05/2022    1:43 AM  CBC  WBC 4.0 - 10.5 K/uL 21.7   14.8   11.8     Hemoglobin 13.0 - 17.0 g/dL 13.7   15.7   13.1    Hematocrit 39.0 - 52.0 % 39.3   45.4   39.4    Platelets 150 - 400 K/uL 292   336   267       LOS: 7 days A FACE TO FACE EVALUATION WAS PERFORMED  Charlett Blake 02/15/2022, 7:48 AM

## 2022-02-15 NOTE — Progress Notes (Signed)
Physical Therapy Session Note  Patient Details  Name: [name] MRN: [id number] Date of Birth: 1956/03/31  Today's Date: 02/15/2022 PT Individual Time: 1050-1200 PT Individual Time Calculation (min): 70 min   Short Term Goals: Week 1:  PT Short Term Goal 1 (Week 1): = to LTGs based on ELOS  Skilled Therapeutic Interventions/Progress Updates:    Pt received sitting in recliner and agreeable to therapy session although reporting significant fatigue from earlier therapy session. Sit<>stands using rollator mod-I during session. Therapist adjusted height of rollator handles for improved fit for patient. Pt ambulated ~131f to/from gym using rollator during session mod-I. Stair navigation ascending/descending 4 (6" height) steps x3 using B HRs with reciprocal pattern and close supervision for safety - continues to have visible weakness in hip/knee extensors when powering up onto step during ascent. Pt ambulated ~1271fx2 to/from ortho gym using rollator mod-I targeting gait endurance.   Simulated ambulatory (SUV height) car transfer from rollator with pt having to turn and sit on the seat prior to placing LEs into vehicle as pt unable to perform sufficient hip flexion, despite attempting to compensate by lifting his legs with UEs, in order to side step into vehicle - discussed attempting to use L UE on handle above head in vehicle while simultaneously using R UE to assist with lifting L LE into the vehicle to lift his hip up onto seat - unable to do in car simultor because height of floorboard.   Pt ambulated up/down ramp using rollator mod-I with no signs of instability.  Discussed floor transfer training with pt stating he feels likely he will have a fall out in the community rather than in his home requiring situational problem solving and may not have the resources in the community he will need to get off the ground requiring him to call for assistance from EMS or [employer] Pt requiring  frequent seated rest breaks during session due to B LE fatigue.  Five times Sit to Stand Test (FTSS) Method: Use a straight back chair with a solid seat that is 16-18" high. Ask participant to sit on the chair with arms folded across their chest.   Instructions: "Stand up and sit down as quickly as possible 5 times, keeping your arms folded across your chest."   Measurement: Stop timing when the participant stands the 5th time.  TIME: 19 seconds (in seconds) (significant improvement compared to 32seconds on 02/10/22)  - pt continues to have to push up from his knees in order to come to standing without physical assistance, only close supervision for safety  Times > 13.6 seconds is associated with increased disability and morbidity (Guralnik, 2000) Times > 15 seconds is predictive of recurrent falls in healthy individuals aged 6535nd older (Buatois, et al., 2008) Normal performance values in community dwelling individuals aged 6075nd older (Bohannon, 2006): 60-69 years: 11.4 seconds 70-79 years: 12.6 seconds 80-89 years: 14.8 seconds  MCID: ? 2.3 seconds for Vestibular Disorders (Meretta, 2006)  Pt participated in Functional Gait Assessment (FGA) with score of 12/30 demonstrating high fall risk (low fall risk 25-28, medium fall risk 19-24, and high fall risk <19). Pt educated on results of assessment and need to use rollator for functional gait and pt in agreement.  Pt reports no additional questions/concerns at this time and states he is looking forward to going home tomorrow. At end of session, pt left seated in recliner with needs in reach.  Therapy Documentation Precautions:  Precautions Precautions: Fall,  Other (comment) Precaution Comments: proximal muscle weakness Restrictions Weight Bearing Restrictions: No  Balance Functional Gait  Assessment Gait Level Surface: Walks 20 ft in less than 7 sec but greater than 5.5 sec, uses assistive device, slower speed, mild gait  deviations, or deviates 6-10 in outside of the 12 in walkway width. Change in Gait Speed: Able to change speed, demonstrates mild gait deviations, deviates 6-10 in outside of the 12 in walkway width, or no gait deviations, unable to achieve a major change in velocity, or uses a change in velocity, or uses an assistive device. Gait with Horizontal Head Turns: Performs head turns smoothly with slight change in gait velocity (eg, minor disruption to smooth gait path), deviates 6-10 in outside 12 in walkway width, or uses an assistive device. Gait with Vertical Head Turns: Performs task with moderate change in gait velocity, slows down, deviates 10-15 in outside 12 in walkway width but recovers, can continue to walk. Gait and Pivot Turn: Turns slowly, requires verbal cueing, or requires several small steps to catch balance following turn and stop Step Over Obstacle: Cannot perform without assistance. Gait with Narrow Base of Support: Ambulates less than 4 steps heel to toe or cannot perform without assistance. Gait with Eyes Closed: Cannot walk 20 ft without assistance, severe gait deviations or imbalance, deviates greater than 15 in outside 12 in walkway width or will not attempt task. Ambulating Backwards: Walks 20 ft, uses assistive device, slower speed, mild gait deviations, deviates 6-10 in outside 12 in walkway width. Steps: Alternating feet, must use rail. Total Score: 12   Pain: Reports no pain, but states he is "just sore and tired"    Therapy/Group: Individual Therapy  Tawana Scale , PT, DPT, NCS, CSRS 02/15/2022, 8:05 AM

## 2022-02-15 NOTE — Progress Notes (Signed)
Inpatient Rehabilitation Discharge Medication Review by a Pharmacist  A complete drug regimen review was completed for this patient to identify any potential clinically significant medication issues.  High Risk Drug Classes Is patient taking? Indication by Medication  Antipsychotic No   Anticoagulant No   Antibiotic No   Opioid No   Antiplatelet Yes Aspirin, Brilinta- CAD  Hypoglycemics/insulin Yes Lantus, metformin- T2DM  Vasoactive Medication Yes Coreg, Norvasc, Lisinopril, NitroSL- hypertension, angina   Chemotherapy No   Other Yes Repatha- HLD     Type of Medication Issue Identified Description of Issue Recommendation(s)  Drug Interaction(s) (clinically significant)     Duplicate Therapy     Allergy     No Medication Administration End Date     Incorrect Dose     Additional Drug Therapy Needed     Significant med changes from prior encounter (inform family/care partners about these prior to discharge).    Other       Clinically significant medication issues were identified that warrant physician communication and completion of prescribed/recommended actions by midnight of the next day:  No   Time spent performing this drug regimen review (minutes):  30   Vira Chaplin BS, PharmD, BCPS Clinical Pharmacist 02/16/2022 9:33 AM  Contact: [phone] after 3 PM  "Be curious, not judgmental..." -Jamal Maes

## 2022-02-15 NOTE — Progress Notes (Signed)
Inpatient Rehabilitation Care Coordinator Discharge Note   Patient Details  Name: [name] MRN: [id number] Date of Birth: September 21, 1956   Discharge location: Home  Length of Stay: 8 Days  Discharge activity level: Sup  Home/community participation: daughter  Patient response BP:ZWCHEN Literacy - How often do you need to have someone help you when you read instructions, pamphlets, or other written material from your doctor or pharmacy?: Sometimes  Patient response ID:[id number] Isolation - How often do you feel lonely or isolated from those around you?: Never  Services provided included: MD, RD, PT, OT, SLP, TR, CM, RN, Pharmacy, SW  Financial Services:  Charity fundraiser Utilized: [employer]  Choices offered to/list presented to: Patient  Follow-up services arranged:  Outpatient    Outpatient Servicies: Neuro Rehab      Patient response to transportation need: Is the patient able to respond to transportation needs?: Yes In the past 12 months, has lack of transportation kept you from medical appointments or from getting medications?: No In the past 12 months, has lack of transportation kept you from meetings, work, or from getting things needed for daily living?: No    Comments (or additional information):  Patient/Family verbalized understanding of follow-up arrangements:     Individual responsible for coordination of the follow-up plan: patient or Abigail  Confirmed correct DME delivered: Dyanne Iha 02/15/2022    Dyanne Iha

## 2022-02-15 NOTE — Progress Notes (Signed)
Occupational Therapy Session Note  Patient Details  Name: [name] MRN: [id number] Date of Birth: 08/06/1956  Today's Date: 02/15/2022 OT Individual Time: 5638-7564 OT Individual Time Calculation (min): 56 min    Short Term Goals: Week 1:  OT Short Term Goal 1 (Week 1): STGs=LTGs  Skilled Therapeutic Interventions/Progress Updates:  Pt greeted seated at sink taking meds with LPN, pt agreeable to OT intervention. Session focus on floor transfers, education on fall prevention, and standing LB strengthening to increase LB strength and endurance to decrease risk of falls at home.  General fall prevention education provided on decreasing clutter, setting up safe home enviroment, removing rugs and knowing local fire dept number in case fall does occur and extra support is needed to get off floor, pt verbalized understanding of all education.  Pt able to crawl on to mat table and position self in supine, practiced method of rolling to prone and pushing up into quadraped however pt unable to bring BLEs underneath him without assist d/t poor hip ROM/ decreased LB strength. Pt was able to bring BUEs onto bench to simulate pulling up to chair/dresser but  unable to bring BLEs underneath him. Practiced supine>sidelying method with pt needing MAX A to elevate trunk from sidelying>long sitting. If pt could get into long sitting position did educate pt on using sheet at pts hips and having family boost hips back into a chair, pt agreeable. Remainder of session focus on, BUE strength, core strength hip strengthening/ROM. Pt completed below activities; - x10 pushes up from quadraped with BUEs on bench        - standing hip ABD/ADD while standing at parallel bars 3x10 reps  - standing hip internal/external ROM with BUE support on parallel bars to simulate being able to bring BLEs into tall kneeling from quadraped in case of falls; 3x10 reps - side stepping over obstacles R and L to promote improved hip  ABD/ADD strength with BUE support on parallel bars with overall CGA  Encouraged pt to continue working on hip strengthening therex at home with examples provided. Pt left seated in recliner.             Therapy Documentation Precautions:  Precautions Precautions: Fall, Other (comment) Precaution Comments: proximal muscle weakness Restrictions Weight Bearing Restrictions: No  Pain: no pain reported during session    Therapy/Group: Individual Therapy  Corinne Ports [facility] 02/15/2022, 11:50 AM

## 2022-02-15 NOTE — Discharge Summary (Signed)
Physical Therapy Discharge Summary  Patient Details  Name: [name] MRN: [id number] Date of Birth: 09-17-1956  Today's Date: 02/15/2022 PT Individual Time: 4098-1191 PT Individual Time Calculation (min): 68 min    Patient has met 9 of 9 long term goals due to improved activity tolerance, improved balance, improved postural control, increased strength, ability to compensate for deficits, and functional use of  right upper extremity, right lower extremity, left upper extremity, and left lower extremity.  Patient to discharge at an ambulatory level Modified Independent household ambulation using rollator and supervision for community ambulation. Patient's care partner is independent to provide the necessary physical assistance at discharge.  All goals met.  Recommendation:  Patient will benefit from ongoing skilled PT services in outpatient setting to continue to advance safe functional mobility, address ongoing impairments in core and proximal B UE and B LE strengthening (shoulders and hips), dynamic gait training with LRAD, floor transfer training, and minimize fall risk.  Equipment: Rollator  Reasons for discharge: treatment goals met and discharge from hospital  Patient/family agrees with progress made and goals achieved: Yes  Skilled Therapeutic Interventions/Progress Updates:  Pt received sitting in recliner and agreeable to therapy session. Sit<>stands using rollator mod-I during session. Pt ambulated ~144ft x2 to/from main therapy gym using rollator mod-I demonstrating appropriate and safe use of AD including use of brakes during transfers. Pt participated in the following exercises focusing on core, hip, and shoulder strengthening as these areas are pt's primary areas of weakness. Provided pt with multimodal cuing to ensure proper technique/form while providing pt specific written cuing on HEP to reference after D/C. Educated pt on stopping exercises if experiencing excessive  muscle fatigue or pain.   Access Code: [facility] URL: [email or web address] Date: 02/15/2022 Prepared by: Page Spiro  Exercises - Walking  - 1 x daily - 7 x weekly - 2-3 sets - 131ft hold - Sit to Stand with Hands on Knees  - 1 x daily - 7 x weekly - 2-3 sets - 10 reps - Supine Bridge with Resistance Band  - 1 x daily - 7 x weekly - 2-3 sets - 10-12 reps - Clam with Resistance  - 1 x daily - 7 x weekly - 2-3 sets - 10-20 reps - Supine Dead Bug with Leg Extension  - 1 x daily - 7 x weekly - 2-3 sets - 10-20 reps - Dead Bug  - 1 x daily - 7 x weekly - 2-3 sets - 10 reps - Supine Chest Press with Dumbbells  - 1 x daily - 7 x weekly - 2-3 sets - 10-20 reps - Sidelying Shoulder External Rotation Dumbbell  - 1 x daily - 7 x weekly - 2-3 sets - 15-20 reps - Side Stepping with Resistance at Thighs  - 1 x daily - 7 x weekly - 3 sets - 10 reps - Standing Bicep Curls Supinated with Dumbbells  - 1 x daily - 7 x weekly - 2-3 sets - 10-20 reps  Pt ambulated ~342ft using rollator mod-I working on gait endurance after exercises. At end of session, pt left seated in recliner with needs in reach.   PT Discharge Precautions/Restrictions Precautions Precautions: Fall;Other (comment) Precaution Comments: core and proximal B UE and B LE muscle weakness Restrictions Weight Bearing Restrictions: No Pain Pain Assessment Pain Scale: 0-10 Pain Score: 0-No pain (states no pain but comments he is "just very sore and tired" as muscle soreness) Pain Interference Pain Interference Pain Effect on Sleep: 1. Rarely or  not at all Pain Interference with Therapy Activities: 1. Rarely or not at all Pain Interference with Day-to-Day Activities: 1. Rarely or not at all Vision/Perception  Vision - History Ability to See in Adequate Light: 0 Adequate Perception Perception: Within Functional Limits Praxis Praxis: Intact  Cognition Overall Cognitive Status: Within Functional Limits for tasks  assessed Arousal/Alertness: Awake/alert Orientation Level: Oriented X4 Year: 2023 Month: May Day of Week: Correct Attention: Focused;Sustained;Selective Focused Attention: Appears intact Sustained Attention: Appears intact Selective Attention: Appears intact Memory: Appears intact Awareness: Appears intact Problem Solving: Appears intact Executive Function: Sequencing;Organizing Sequencing: Appears intact Organizing: Appears intact Safety/Judgment: Appears intact Sensation Sensation Light Touch: Appears Intact Hot/Cold: Not tested Proprioception: Appears Intact Stereognosis: Not tested Coordination Gross Motor Movements are Fluid and Coordinated: No Fine Motor Movements are Fluid and Coordinated: Yes Coordination and Movement Description: core and proximal B UE and B LE weakness with generalized deconditioning although improved since initial evaluation Motor  Motor Motor: Other (comment) Motor - Discharge Observations: continues to have core and proximal B UE and B LE weakness with generalized deconditioning that is improving since initial evaluation  Mobility Bed Mobility Bed Mobility: Supine to Sit;Sit to Supine Supine to Sit: Independent with assistive device Sit to Supine: Independent with assistive device Transfers Transfers: Sit to Stand;Stand to Sit;Stand Pivot Transfers Sit to Stand: Independent with assistive device Stand to Sit: Independent with assistive device Stand Pivot Transfers: Independent with assistive device Transfer (Assistive device): Rollator Locomotion  Gait Ambulation: Yes Gait Assistance: Independent with assistive device;Supervision/Verbal cueing Gait Distance (Feet): 1000 Feet Assistive device: Rollator Gait Gait: Yes Gait Pattern: Within Functional Limits Gait velocity: 0.71m/s indicating community Freight forwarder / Additional Locomotion Stairs: Yes Stairs Assistance: Supervision/Verbal cueing Stair Management  Technique: Two rails;Alternating pattern;Forwards (reciprocal pattern in both directions using B HRs) Number of Stairs: 12 Height of Stairs: 6 Ramp: Independent with assistive device (using rollator) Curb: Supervision/Verbal cueing (using rollator) Wheelchair Mobility Wheelchair Mobility: No  Trunk/Postural Assessment  Cervical Assessment Cervical Assessment: Within Functional Limits Thoracic Assessment Thoracic Assessment: Exceptions to [facility] (thoracic and shoulder rounding) Lumbar Assessment Lumbar Assessment: Exceptions to [facility] (flexible posterior pelvic tilt in sitting) Postural Control Postural Control: Deficits on evaluation Trunk Control: core weakness for high level dynaimc sitting balance tasks Postural Limitations: decreased due to core and hip weakness  Balance Balance Balance Assessed: Yes Standardized Balance Assessment Standardized Balance Assessment: Functional Gait Assessment Static Sitting Balance Static Sitting - Balance Support: Feet supported;No upper extremity supported Static Sitting - Level of Assistance: 6: Modified independent (Device/Increase time) Dynamic Sitting Balance Dynamic Sitting - Balance Support: During functional activity Dynamic Sitting - Level of Assistance: 6: Modified independent (Device/Increase time) Static Standing Balance Static Standing - Balance Support: During functional activity;Bilateral upper extremity supported Static Standing - Level of Assistance: 6: Modified independent (Device/Increase time) Dynamic Standing Balance Dynamic Standing - Balance Support: During functional activity;Bilateral upper extremity supported Dynamic Standing - Level of Assistance: 6: Modified independent (Device/Increase time);5: Stand by assistance Functional Gait  Assessment Gait Level Surface: Walks 20 ft in less than 7 sec but greater than 5.5 sec, uses assistive device, slower speed, mild gait deviations, or deviates 6-10 in outside of the 12 in  walkway width. Change in Gait Speed: Able to change speed, demonstrates mild gait deviations, deviates 6-10 in outside of the 12 in walkway width, or no gait deviations, unable to achieve a major change in velocity, or uses a change in velocity, or uses an assistive device. Gait with  Horizontal Head Turns: Performs head turns smoothly with slight change in gait velocity (eg, minor disruption to smooth gait path), deviates 6-10 in outside 12 in walkway width, or uses an assistive device. Gait with Vertical Head Turns: Performs task with moderate change in gait velocity, slows down, deviates 10-15 in outside 12 in walkway width but recovers, can continue to walk. Gait and Pivot Turn: Turns slowly, requires verbal cueing, or requires several small steps to catch balance following turn and stop Step Over Obstacle: Cannot perform without assistance. Gait with Narrow Base of Support: Ambulates less than 4 steps heel to toe or cannot perform without assistance. Gait with Eyes Closed: Cannot walk 20 ft without assistance, severe gait deviations or imbalance, deviates greater than 15 in outside 12 in walkway width or will not attempt task. Ambulating Backwards: Walks 20 ft, uses assistive device, slower speed, mild gait deviations, deviates 6-10 in outside 12 in walkway width. Steps: Alternating feet, must use rail. Total Score: 12 Extremity Assessment      RLE Assessment RLE Assessment: Exceptions to [facility] Active Range of Motion (AROM) Comments: WFL General Strength Comments: assessed in sitting RLE Strength Right Hip Flexion: 3-/5 Right Knee Flexion: 4-/5 Right Knee Extension: 4-/5 Right Ankle Dorsiflexion: 4-/5 Right Ankle Plantar Flexion: 4-/5 LLE Assessment LLE Assessment: Exceptions to [facility] Active Range of Motion (AROM) Comments: WFL General Strength Comments: assessed in sitting LLE Strength Left Hip Flexion: 2+/5 Left Knee Flexion: 3+/5 Left Knee Extension: 4/5 Left Ankle Dorsiflexion:  4-/5 Left Ankle Plantar Flexion: 4-/5    Lonie Rummell M Rudransh Bellanca , PT, DPT, NCS, CSRS 02/15/2022, 8:06 AM

## 2022-02-16 LAB — GLUCOSE, CAPILLARY: Glucose-Capillary: 88 mg/dL (ref 70–99)

## 2022-02-16 MED ORDER — NITROGLYCERIN 0.4 MG SL SUBL: 0.4000 mg | SUBLINGUAL_TABLET | SUBLINGUAL | 12 refills | Status: DC | PRN

## 2022-02-16 MED ORDER — INSULIN GLARGINE 100 UNIT/ML SOLOSTAR PEN
12.0000 [IU] | PEN_INJECTOR | Freq: Two times a day (BID) | SUBCUTANEOUS | 11 refills | Status: AC
Start: 2022-02-16 — End: ?

## 2022-02-16 MED ORDER — AMLODIPINE BESYLATE 10 MG PO TABS: 10.0000 mg | ORAL_TABLET | Freq: Every morning | ORAL | 0 refills | Status: AC

## 2022-02-16 MED ORDER — METFORMIN HCL 500 MG PO TABS: 1000.0000 mg | ORAL_TABLET | Freq: Two times a day (BID) | ORAL | 0 refills | Status: AC

## 2022-02-16 MED ORDER — LISINOPRIL 40 MG PO TABS: 40.0000 mg | ORAL_TABLET | Freq: Every morning | ORAL | 0 refills | Status: AC

## 2022-02-16 MED ORDER — CARVEDILOL 12.5 MG PO TABS: 12.5000 mg | ORAL_TABLET | Freq: Two times a day (BID) | ORAL | 11 refills | Status: DC

## 2022-02-16 MED ORDER — PREDNISONE 10 MG PO TABS: ORAL_TABLET | ORAL | 0 refills | Status: DC

## 2022-02-16 MED ORDER — TICAGRELOR 90 MG PO TABS: 90.0000 mg | ORAL_TABLET | Freq: Two times a day (BID) | ORAL | 3 refills | Status: DC

## 2022-02-16 MED ORDER — VITAMIN B-12 2500 MCG SL SUBL: 1.0000 | SUBLINGUAL_TABLET | Freq: Every day | SUBLINGUAL | 0 refills | Status: AC

## 2022-02-16 MED ORDER — ACETAMINOPHEN 325 MG PO TABS
325.0000 mg | ORAL_TABLET | ORAL | Status: AC | PRN
Start: 2022-02-16 — End: ?

## 2022-02-16 MED ORDER — CITALOPRAM HYDROBROMIDE 20 MG PO TABS: 20.0000 mg | ORAL_TABLET | Freq: Every day | ORAL | 0 refills | Status: AC

## 2022-02-16 MED ORDER — DICLOFENAC SODIUM 1 % EX GEL: 2.0000 g | Freq: Four times a day (QID) | CUTANEOUS | 0 refills | Status: AC

## 2022-02-16 MED ORDER — CYCLOBENZAPRINE HCL 10 MG PO TABS: 10.0000 mg | ORAL_TABLET | Freq: Every day | ORAL | 0 refills | Status: AC

## 2022-02-16 MED ORDER — VITAMIN D 1000 UNITS PO TABS: 1000.0000 [IU] | ORAL_TABLET | Freq: Every day | ORAL | 0 refills | Status: AC

## 2022-02-16 NOTE — Progress Notes (Signed)
PROGRESS NOTE   Subjective/Complaints:  Discussed prednisone taper with PA   ROS- neg CP, SOB, N/V/D  Objective:   No results found. Recent Labs    02/14/22 0624  WBC 21.7*  HGB 13.7  HCT 39.3  PLT 292    Recent Labs    02/14/22 0624  NA 139  K 3.9  CL 104  CO2 27  GLUCOSE 69*  BUN 27*  CREATININE 0.49*  CALCIUM 8.9     Intake/Output Summary (Last 24 hours) at 02/16/2022 0824 Last data filed at 02/16/2022 0748 Gross per 24 hour  Intake 539 ml  Output --  Net 539 ml         Physical Exam: Vital Signs Blood pressure 124/60, pulse 73, temperature 97.9 F (36.6 C), resp. rate 18, height '5\' 10"'$  (1.778 m), weight 81.6 kg, SpO2 99 %.   Skin: No evidence of breakdown, no evidence of rash Neurologic: Cranial nerves II through XII intact, motor strength is 3-/5 in bilateral deltoid, 4- bicep, tricep,5/5 grip, 3- hip flexor, 4/5 knee extensors, 5/5 ankle dorsiflexor and plantar flexor Musculoskeletal: Full range of motion in all 4 extremities. No joint swelling. Needs to push on hands to go from sitting to standing.   Assessment/Plan: 1. 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 Stable for D/C today F/u PCP in 3-4 weeks F/u PM&R 2 weeks F/u neuro 4-6 wks  See D/C summary See D/C instructions   Care Tool:  Bathing    Body parts bathed by patient: Right arm, Left arm, Chest, Abdomen, Front perineal area, Buttocks, Right upper leg, Left upper leg, Right lower leg, Left lower leg, Face   Body parts bathed by helper:  (asisted to wash hair)     Bathing assist Assist Level: Independent with assistive device     Upper Body Dressing/Undressing Upper body dressing   What is the patient wearing?: Pull over shirt    Upper body assist Assist Level: Independent with assistive device    Lower Body Dressing/Undressing Lower body dressing      What is the patient wearing?: Pants, Underwear/pull up      Lower body assist Assist for lower body dressing: Independent with assitive device     Toileting Toileting    Toileting assist Assist for toileting: Independent with assistive device     Transfers Chair/bed transfer  Transfers assist     Chair/bed transfer assist level: Independent with assistive device Chair/bed transfer assistive device: Other (rollator)   Locomotion Ambulation   Ambulation assist      Assist level: Independent with assistive device Assistive device: Rollator Max distance: 32f   Walk 10 feet activity   Assist     Assist level: Independent with assistive device Assistive device: Rollator   Walk 50 feet activity   Assist    Assist level: Independent with assistive device Assistive device: Rollator    Walk 150 feet activity   Assist    Assist level: Independent with assistive device Assistive device: Rollator    Walk 10 feet on uneven surface  activity   Assist     Assist level: Independent with assistive device Assistive device: REvent organiser  Assist Is the patient using a wheelchair?: No             Wheelchair 50 feet with 2 turns activity    Assist            Wheelchair 150 feet activity     Assist          Blood pressure 124/60, pulse 73, temperature 97.9 F (36.6 C), resp. rate 18, height '5\' 10"'$  (1.778 m), weight 81.6 kg, SpO2 99 %.    Medical Problem List and Plan: 1. Functional deficits secondary Inflammatory myositis             -Statin induced myopathy also in differential             d/c home today   last CK down to 991 2.  Antithrombotics: -DVT/anticoagulation:  Pharmaceutical: Lovenox             -antiplatelet therapy: ASA/Brilinta 3. Pain Management: Tylenol prn.  4. Mood: LCSW to follow for evaluation and support.              -antipsychotic agents: N/A 5. Neuropsych: This patient is capable of making decisions on his own behalf. 6. Skin/Wound Care:  Routine pressure relief measures.  7. Fluids/Electrolytes/Nutrition: Monitor I/O. Check CMET/CK in am.    Latest Ref Rng & Units 02/14/2022    6:24 AM 02/09/2022    6:19 AM 02/08/2022    1:41 AM  BMP  Glucose 70 - 99 mg/dL 69   98   239    BUN 8 - 23 mg/dL '27   25   29    '$ Creatinine 0.61 - 1.24 mg/dL 0.49   0.44   0.57    Sodium 135 - 145 mmol/L 139   139   137    Potassium 3.5 - 5.1 mmol/L 3.9   4.0   4.4    Chloride 98 - 111 mmol/L 104   104   104    CO2 22 - 32 mmol/L '27   27   24    '$ Calcium 8.9 - 10.3 mg/dL 8.9   9.3   9.0      8.  Inflammatory myositis v/s statin induced myositis:CK stabilizing at 2000 no muscle pain or renal abnormalities              -Outpatient muscle biopsy in about 3-4 months             -Prednisone '80mg'$  PO qd            -5/21 Continue prednisone 80 mg qd, with taper, next decrease to 70 mg on Tuesday 5/23, reduce by '10mg'$  per week to '10mg'$  , then reduce by '1mg'$  every 2 wks until dose of '5mg'$  - f/u with Neuro [facility] vs [facility] 9. CAD s/p DES to RCA: Monitor for symptoms with increase in activity.              --on ASA, Brilinta, coreg and lisinopril --was to start Repatha PTA.               -Denies Chest pain 10. T2DM: Hgb A1c-7.4. Monitor BS ac/hs and use SSI for elevated BS.  --Was on metformin 1000 mg bid, Lantus 30 u/hs and Actos 30 mg PTA.              --Blood sugars poorly controlled likely due to solumedrol--now on 80 mg daily --Anticipate improvement with increase in activity and question taper.             --  Continue insulin glargine bid and resume metformin for better control.  CBG (last 3)  Recent Labs    02/15/22 1619 02/15/22 2126 02/16/22 0621  GLUCAP 198* 227* 88    Morning CBG still on low side but will cont current Semglee and enc hs snack , needs 7U Novalog for daytime elevation - f/u PCP 11. HTN: Monitor BP TID--continue Norvasc, Lisinopril and coreg.             Overall well controlled, continue to monitor Controlled 5/24     02/16/2022    4:55 AM 02/15/2022    8:18 PM 02/15/2022    2:30 PM  Vitals with BMI  Systolic 027 741 287  Diastolic 60 67 57  Pulse 73 85 75    12.  Pre-renal azotemia: Encourage fluid intake.  --Recheck BUN/SCr improving  5/20 last BUN 25 and Crt 0.44 (5/17) continue to trend qMonday labs.    Latest Ref Rng & Units 02/14/2022    6:24 AM 02/09/2022    6:19 AM 02/08/2022    1:41 AM  BMP  Glucose 70 - 99 mg/dL 69   98   239    BUN 8 - 23 mg/dL '27   25   29    '$ Creatinine 0.61 - 1.24 mg/dL 0.49   0.44   0.57    Sodium 135 - 145 mmol/L 139   139   137    Potassium 3.5 - 5.1 mmol/L 3.9   4.0   4.4    Chloride 98 - 111 mmol/L 104   104   104    CO2 22 - 32 mmol/L '27   27   24    '$ Calcium 8.9 - 10.3 mg/dL 8.9   9.3   9.0      13. Abnormal LFTs: Resolving. Recheck in am.  14. Hypokalemia: Mg- 2.1 and WNL. Likely due to steroids.  --Continue Kdur 40 meq daily for supplement.  5/21 corrected, continue to monitor. 15.  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  Leukocytosis due to steroids afebrile   5/23 Likely reactive d/t steroids, no other s/s of infection    Latest Ref Rng & Units 02/14/2022    6:24 AM 02/09/2022    6:19 AM 02/05/2022    1:43 AM  CBC  WBC 4.0 - 10.5 K/uL 21.7   14.8   11.8    Hemoglobin 13.0 - 17.0 g/dL 13.7   15.7   13.1    Hematocrit 39.0 - 52.0 % 39.3   45.4   39.4    Platelets 150 - 400 K/uL 292   336   267       LOS: 8 days A FACE TO FACE EVALUATION WAS PERFORMED  Luanna Salk Ravonda Brecheen 02/16/2022, 8:24 AM

## 2022-02-16 NOTE — Progress Notes (Signed)
Discharge instructions discussed with pt/family per PA. Pt/family in agreement. Questions answered. Pt verbalizes understanding of instructions and insulin pen use. Belongings gathered and sent with pt. Pt left per wheelchair to private vehicle. No complications noted. Sheela Stack, LPN

## 2022-02-17 ENCOUNTER — Ambulatory Visit: Payer: Medicare Other | Attending: Physician Assistant | Admitting: Physical Therapy

## 2022-02-17 ENCOUNTER — Encounter: Payer: Self-pay | Admitting: Occupational Therapy

## 2022-02-17 ENCOUNTER — Encounter: Payer: Self-pay | Admitting: Physical Therapy

## 2022-02-17 ENCOUNTER — Telehealth (HOSPITAL_COMMUNITY): Payer: Self-pay

## 2022-02-17 ENCOUNTER — Ambulatory Visit: Payer: Medicare Other | Admitting: Occupational Therapy

## 2022-02-17 DIAGNOSIS — M25512 Pain in left shoulder: Secondary | ICD-10-CM | POA: Insufficient documentation

## 2022-02-17 DIAGNOSIS — I6523 Occlusion and stenosis of bilateral carotid arteries: Secondary | ICD-10-CM | POA: Diagnosis not present

## 2022-02-17 DIAGNOSIS — R2689 Other abnormalities of gait and mobility: Secondary | ICD-10-CM

## 2022-02-17 DIAGNOSIS — M25511 Pain in right shoulder: Secondary | ICD-10-CM

## 2022-02-17 DIAGNOSIS — R2681 Unsteadiness on feet: Secondary | ICD-10-CM | POA: Diagnosis not present

## 2022-02-17 DIAGNOSIS — R262 Difficulty in walking, not elsewhere classified: Secondary | ICD-10-CM | POA: Insufficient documentation

## 2022-02-17 DIAGNOSIS — R293 Abnormal posture: Secondary | ICD-10-CM | POA: Insufficient documentation

## 2022-02-17 DIAGNOSIS — M3320 Polymyositis, organ involvement unspecified: Secondary | ICD-10-CM | POA: Insufficient documentation

## 2022-02-17 DIAGNOSIS — R296 Repeated falls: Secondary | ICD-10-CM | POA: Diagnosis not present

## 2022-02-17 DIAGNOSIS — R278 Other lack of coordination: Secondary | ICD-10-CM | POA: Diagnosis not present

## 2022-02-17 DIAGNOSIS — M6281 Muscle weakness (generalized): Secondary | ICD-10-CM | POA: Diagnosis not present

## 2022-02-17 DIAGNOSIS — M3322 Polymyositis with myopathy: Secondary | ICD-10-CM | POA: Diagnosis not present

## 2022-02-17 DIAGNOSIS — Z8673 Personal history of transient ischemic attack (TIA), and cerebral infarction without residual deficits: Secondary | ICD-10-CM | POA: Diagnosis not present

## 2022-02-17 NOTE — Telephone Encounter (Signed)
Pt stated that he just stated PT and that he is going to continue the outpatient services. Closed referral

## 2022-02-17 NOTE — Therapy (Signed)
OUTPATIENT PHYSICAL THERAPY LOWER EXTREMITY EVALUATION   Patient Name: [name] MRN: [id number] DOB:1956/06/07, 66 y.o., male Today's Date: 02/17/2022   PT End of Session - 02/17/22 1646     Visit Number 1    Date for PT Re-Evaluation 05/12/22    PT Start Time 2725    PT Stop Time 1441    PT Time Calculation (min) 39 min    Equipment Utilized During Treatment Gait belt    Activity Tolerance Patient limited by fatigue    Behavior During Therapy WFL for tasks assessed/performed             Past Medical History:  Diagnosis Date   Arthritis    Diabetes mellitus    fasting 120s   History of kidney stones    Hypertension    Peripheral neuropathy    left fifth finger, left toe   Pneumonia    hx of   Past Surgical History:  Procedure Laterality Date   APPENDECTOMY     CORONARY STENT INTERVENTION N/A 12/28/2021   Procedure: CORONARY STENT INTERVENTION;  Surgeon: Jettie Booze, MD;  Location: [facility];  Service: Cardiovascular;  Laterality: N/A;   CYSTOSCOPY W/ URETERAL STENT PLACEMENT Bilateral 03/17/2014   Procedure: CYSTOSCOPY WITH RIGHT STENT PULL  ;  Surgeon: Alexis Frock, MD;  Location: [facility];  Service: Urology;  Laterality: Bilateral;   CYSTOSCOPY WITH RETROGRADE PYELOGRAM, URETEROSCOPY AND STENT PLACEMENT Bilateral 02/01/2014   Procedure: CYSTOSCOPY WITH BILATERAL RETROGRADE CYSTOSCOPY, BILATERAL RETROGRADE, PYELOGRAM, RIGHT URETEROSCOPY, STONE BASKETRY,  AND BILATERAL STENT PLACEMENT;  Surgeon: Alexis Frock, MD;  Location: [facility];  Service: Urology;  Laterality: Bilateral;   CYSTOSCOPY WITH URETEROSCOPY AND STENT PLACEMENT Left 02/12/2021   Procedure: CYSTOSCOPY WITH LEFT URETEROSCOPY, BALLOON DILITATION, HOLMIUM LASER LITHOTRIPSY AND STENT PLACEMENT;  Surgeon: Lucas Mallow, MD;  Location: [facility];  Service: Urology;  Laterality: Left;   HOLMIUM LASER APPLICATION Left 3/66/4403   Procedure: HOLMIUM LASER APPLICATION;  Surgeon: Alexis Frock,  MD;  Location: [facility];  Service: Urology;  Laterality: Left;   INTRAVASCULAR ULTRASOUND/IVUS N/A 12/28/2021   Procedure: Intravascular Ultrasound/IVUS;  Surgeon: Jettie Booze, MD;  Location: [facility];  Service: Cardiovascular;  Laterality: N/A;   LEFT HEART CATH AND CORONARY ANGIOGRAPHY N/A 12/28/2021   Procedure: LEFT HEART CATH AND CORONARY ANGIOGRAPHY;  Surgeon: Jettie Booze, MD;  Location: [facility];  Service: Cardiovascular;  Laterality: N/A;   LITHOTRIPSY     NEPHROLITHOTOMY Left 03/17/2014   Procedure: NEPHROLITHOTOMY PERCUTANEOUS with access;  Surgeon: Alexis Frock, MD;  [facility];  Service: Urology;  Laterality: Left;   NEPHROLITHOTOMY Left 03/19/2014   Procedure: 2ND STAGE NEPHROLITHOTOMY PERCUTANEOUS left ureteroscopy with lasr stone treatment and stent placement, with access;  Surgeon: Alexis Frock, MD;  Location: [facility];  Service: Urology;  Laterality: Left;   TOTAL KNEE ARTHROPLASTY Right 09/02/2014   Procedure: RIGHT TOTAL KNEE ARTHROPLASTY;  Surgeon: Meredith Pel, MD;  Location: [facility];  Service: Orthopedics;  Laterality: Right;   uretral stent     Patient Active Problem List   Diagnosis Date Noted   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   Elevated CK 02/02/2022   Elevated LFTs 02/02/2022   CAD (coronary artery disease) 02/02/2022   Muscle weakness of all 4 extremities 02/02/2022   Hyperlipidemia 01/28/2022   NSTEMI (non-ST elevated myocardial infarction) ([place]) 12/27/2021   Arthritis of knee 09/02/2014   Staghorn kidney stones 03/17/2014   Nephrolithiasis 02/01/2014  Pneumonia 08/27/2011   Type 2 diabetes mellitus ([place]) 08/27/2011    PCP: Greig Right, MD  REFERRING PROVIDER: Cathlyn Parsons, PA-C   REFERRING DIAG: M33.20 (ICD-10-CM) - Polymyositis, organ involvement unspecified   THERAPY DIAG:  Polymyositis ([place])  Abnormal posture  Muscle weakness (generalized)  Unsteadiness on feet  Difficulty in walking, not  elsewhere classified  Other lack of coordination  Other abnormalities of gait and mobility  Rationale for Evaluation and Treatment Rehabilitation  ONSET DATE: 02/15/2022   SUBJECTIVE:   SUBJECTIVE STATEMENT: Patient reports about 5-6 months ago he started feeling week. Had a HA on 12/27/21. A week ago he noted slurred speech, had a small stroke, which does not appear to have lingering effects. 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  PERTINENT HISTORY: [name] is a 66 y.o. right-handed male with history of type 2 diabetes mellitus hypertension intolerance of statin due to abnormal LFTs (off for the past years), CAD status post DES to RCA 12/28/2021 (addition of arterial for statin 20 mg, aspirin/Brilinta),   PAIN:  Are you having pain? Yes: NPRS scale: 4/10 Pain location: head Pain description: aching Aggravating factors: fell and hit his head Relieving factors: tiem  PRECAUTIONS: None  WEIGHT BEARING RESTRICTIONS No  FALLS:  Has patient fallen in last 6 months? Yes. Number of falls 4  LIVING ENVIRONMENT: Lives with: lives with their family and lives alone Lives in: House/apartment Stairs: No Has following equipment at home: Gilford Rile - 4 wheeled, shower chair, Grab bars, and None  OCCUPATION: Comptroller, office work, Government social research officer.  PLOF: Independent  PATIENT GOALS Increase strength, return to his active lifestyle. 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 Travels to attend concerts.   OBJECTIVE:   DIAGNOSTIC FINDINGS: MRI of the brain done revealing acute right pontine infarct and felt to be incidental. CTA head and neck was negative for LVO but showed extensive atherosclerosis, 70% right ICA stenosis 50% left ICA stenosis, 50% right VA and 40% left VA stenosis. MRI of the spine showed cervical thoracic and lumbar spine negative for cord compression or inflammatory changes. CT abdomen/pelvis was negative  for metastatic disease, intra-abdominal or intrathoracic pathology with moderate nonobstructive left nephrolithiasis with moderate cortical atrophy. 2D echo bubble study was negative for IA shunt.     COGNITION:  Overall cognitive status: Within functional limits for tasks assessed     SENSATION: Not tested   MUSCLE LENGTH: Hamstrings: Right 62 deg; Left 68 deg   POSTURE: rounded shoulders  PALPATION: No TTP  LOWER EXTREMITY ROM: WFL throughout, R hip with mild     tightness.   LOWER EXTREMITY MMT:  MMT Right eval Left eval  Hip flexion 4- 3+  Hip extension 4- 3+  Hip abduction 4- 3+  Hip adduction    Hip internal rotation    Hip external rotation    Knee flexion 4 4-  Knee extension 4 4-  Ankle dorsiflexion 4 4  Ankle plantarflexion    Ankle inversion    Ankle eversion     (Blank rows = not tested)   FUNCTIONAL TESTS:   5x STS18.22 BERG 43/56  GAIT: Distance walked: 100'  Assistive device utilized: Environmental consultant - 4 wheeled Level of assistance: Modified independence Comments: Slow gait, relies on UE support.    TODAY'S TREATMENT: Gait training, balance training   PATIENT EDUCATION:  Education details: POC Person educated: Patient Education method: Explanation Education comprehension: verbalized understanding   HOME EXERCISE PROGRAM:  TBD  ASSESSMENT:  CLINICAL IMPRESSION: Patient is a 66 y.o. who was seen today for physical therapy evaluation and treatment for polymyositis.    OBJECTIVE IMPAIRMENTS Abnormal gait, decreased activity tolerance, decreased balance, decreased coordination, decreased endurance, decreased mobility, difficulty walking, decreased strength, improper body mechanics, and postural dysfunction.   ACTIVITY LIMITATIONS carrying, lifting, bending, standing, squatting, stairs, transfers, bed mobility, and locomotion level  PARTICIPATION LIMITATIONS: meal prep, cleaning, laundry, driving, shopping, community activity, occupation,  and yard work  PERSONAL FACTORS Past/current experiences are also affecting patient's functional outcome.   REHAB POTENTIAL: Good  CLINICAL DECISION MAKING: Evolving/moderate complexity  EVALUATION COMPLEXITY: Moderate   GOALS: Goals reviewed with patient? Yes  SHORT TERM GOALS: Target date: 03/17/2022   I with basic HEP Baseline: Goal status: INITIAL    LONG TERM GOALS: Target date: 05/12/2022   I with final HEP Baseline:  Goal status: INITIAL  2.  Patient will score at least 50/56 on BERG Balance Exam Baseline: 43 Goal status: INITIAL  3.  Paitent will complete 5x sit to stand in < 12 sec to demonstrate improved strength in BLE. Baseline:  Goal status: INITIAL  4.  Patient will ambulate at least 1000' with LRAD, MI, on level and unlevel surfaces Baseline:  Goal status: INITIAL  5.  Patient will be able to manage cooking and cleaning activities in his home without falling and without requiring a rest break. Baseline:  Goal status: INITIAL   PLAN: PT FREQUENCY: 2x/week  PT DURATION: 12 weeks  PLANNED INTERVENTIONS: Therapeutic exercises, Therapeutic activity, Neuromuscular re-education, Balance training, Gait training, Patient/Family education, Joint mobilization, Stair training, and Manual therapy  PLAN FOR NEXT SESSION: Initiate HEP   Marcelina Morel, DPT 02/17/2022, 4:48 PM

## 2022-02-17 NOTE — Therapy (Signed)
OUTPATIENT OCCUPATIONAL THERAPY NEURO EVALUATION  Patient Name: [name] MRN: [id number] DOB:1956/07/13, 66 y.o., male Today's Date: 02/17/2022  PCP: Greig Right, MD REFERRING PROVIDER: Cathlyn Parsons, PA-C    OT End of Session - 02/17/22 1443     Visit Number 1    Date for OT Re-Evaluation 05/12/22   Authorization Type BCBS Medicare    Authorization Time Period VL: [place]    OT Start Time 1441    OT Stop Time 1520    OT Time Calculation (min) 39 min    Behavior During Therapy WFL for tasks assessed/performed            Past Medical History:  Diagnosis Date   Arthritis    Diabetes mellitus    fasting 120s   History of kidney stones    Hypertension    Peripheral neuropathy    left fifth finger, left toe   Pneumonia    hx of   Past Surgical History:  Procedure Laterality Date   APPENDECTOMY     CORONARY STENT INTERVENTION N/A 12/28/2021   Procedure: CORONARY STENT INTERVENTION;  Surgeon: Jettie Booze, MD;  Location: [facility];  Service: Cardiovascular;  Laterality: N/A;   CYSTOSCOPY W/ URETERAL STENT PLACEMENT Bilateral 03/17/2014   Procedure: CYSTOSCOPY WITH RIGHT STENT PULL  ;  Surgeon: Alexis Frock, MD;  [facility];  Service: Urology;  Laterality: Bilateral;   CYSTOSCOPY WITH RETROGRADE PYELOGRAM, URETEROSCOPY AND STENT PLACEMENT Bilateral 02/01/2014   Procedure: CYSTOSCOPY WITH BILATERAL RETROGRADE CYSTOSCOPY, BILATERAL RETROGRADE, PYELOGRAM, RIGHT URETEROSCOPY, STONE BASKETRY,  AND BILATERAL STENT PLACEMENT;  Surgeon: Alexis Frock, MD;  [facility];  Service: Urology;  Laterality: Bilateral;   CYSTOSCOPY WITH URETEROSCOPY AND STENT PLACEMENT Left 02/12/2021   Procedure: CYSTOSCOPY WITH LEFT URETEROSCOPY, BALLOON DILITATION, HOLMIUM LASER LITHOTRIPSY AND STENT PLACEMENT;  Surgeon: Lucas Mallow, MD;  [facility];  Service: Urology;  Laterality: Left;   HOLMIUM LASER APPLICATION Left 7/67/3419   Procedure: HOLMIUM LASER  APPLICATION;  Surgeon: Alexis Frock, MD;  [facility];  Service: Urology;  Laterality: Left;   INTRAVASCULAR ULTRASOUND/IVUS N/A 12/28/2021   Procedure: Intravascular Ultrasound/IVUS;  Surgeon: Jettie Booze, MD;  Location: [facility];  Service: Cardiovascular;  Laterality: N/A;   LEFT HEART CATH AND CORONARY ANGIOGRAPHY N/A 12/28/2021   Procedure: LEFT HEART CATH AND CORONARY ANGIOGRAPHY;  Surgeon: Jettie Booze, MD;  Location: [facility];  Service: Cardiovascular;  Laterality: N/A;   LITHOTRIPSY     NEPHROLITHOTOMY Left 03/17/2014   Procedure: NEPHROLITHOTOMY PERCUTANEOUS with access;  Surgeon: Alexis Frock, MD;  [facility];  Service: Urology;  Laterality: Left;   NEPHROLITHOTOMY Left 03/19/2014   Procedure: 2ND STAGE NEPHROLITHOTOMY PERCUTANEOUS left ureteroscopy with lasr stone treatment and stent placement, with access;  Surgeon: Alexis Frock, MD;  [facility];  Service: Urology;  Laterality: Left;   TOTAL KNEE ARTHROPLASTY Right 09/02/2014   Procedure: RIGHT TOTAL KNEE ARTHROPLASTY;  Surgeon: Meredith Pel, MD;  Location: [facility];  Service: Orthopedics;  Laterality: Right;   uretral stent     Patient Active Problem List   Diagnosis Date Noted   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   Elevated CK 02/02/2022   Elevated LFTs 02/02/2022   CAD (coronary artery disease) 02/02/2022   Muscle weakness of all 4 extremities 02/02/2022   Hyperlipidemia 01/28/2022   NSTEMI (non-ST elevated myocardial infarction) ([place]) 12/27/2021   Arthritis of knee 09/02/2014   Staghorn kidney stones 03/17/2014  Nephrolithiasis 02/01/2014   Pneumonia 08/27/2011   Type 2 diabetes mellitus ([place]) 08/27/2011    ONSET DATE: 12/27/21  REFERRING DIAG: M33.20 (ICD-10-CM) - Polymyositis, organ involvement unspecified   THERAPY DIAG:  Muscle weakness (generalized)  Repeated falls  Other abnormalities of gait and mobility  Right shoulder pain, unspecified  chronicity  Left shoulder pain, unspecified chronicity  Rationale for Evaluation and Treatment Rehabilitation  SUBJECTIVE:   SUBJECTIVE STATEMENT: Pt arrives to OP OT evaluation today w/ primary concerns of generalized weakness, falls, and shoulder pain w/ decreased overall functional use of BUEs. Pt reports having a "slight tear in the rotator cuff on my R side" and has a follow-up w/ ortho MD on Wednesday next week. Pt was d/c from the hospital yesterday and states "it didn't even take me 12 hours to have a fall;" confirms he just visited his PCP and discussed this w/ them, not needing to seek further medical attention at this time. Was in the hospital at the beginning of April for a heart attack w/ subsequent stent placement and then hospitalized again May 10th due to progressive weakness; reports he is currently awaiting further diagnostic testing at this time.  PERTINENT HISTORY: Suspected polymyositis; incidental small silent brainstem stroke (R pons infarct) identified per MRI; cervical spine neuroforaminal stenosis; non-ST-elevation myocardial infarction (NSTEMI) s/p percutaneous coronary intervention (PCI) and drug-eluting stent (DES) placement 12/28/21; bilateral shoulder pain w/ R worse than L. Additional PMH includes CAD, DMT2, HTN, dyslipidemia, nephrolithiasis, recent transaminitis, h/o stage I prostate cancer, and R TKA in 2016.  PRECAUTIONS: Fall; gradual return to driving; don't lift over a quarter gallon of milk/over 10 lbs  WEIGHT BEARING RESTRICTIONS No  PAIN:  Are you having pain? Yes: NPRS scale: 7/10 Pain location: head Pain description: headache Aggravating factors: N/A Relieving factors: rest  FALLS: Has patient fallen in last 6 months? Yes. 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, chartered at the [employer]; travels for bands/festivals; yardwork  OBJECTIVE:   HAND DOMINANCE: Left  ADLs: Overall ADLs: Mod I w/ all BADLs; difficulty w/ overhead tasks and limited activity tolerance/endurance Transfers/ambulation related to ADLs: requires AD for in-home mobility Equipment: Shower seat with back, Grab bars, Reacher, and rail by the bed  IADLs: Shopping: Mod I Housekeeping: Requires assist Meal Prep: Able to complete simple meal/snack prep Community mobility: Independent Medication management: Independent Financial management: Independent  MOBILITY STATUS: Hx of falls; ambulated in/out of session w/ 4-wheel rollator at [facility]  ACTIVITY TOLERANCE: Reports decreased activity tolerance, frequent rest breaks  FUNCTIONAL OUTCOME MEASURES: Quick Dash: 59.1  - moderate functional impact Unable to do heavy household chores; wash back; perform recreational activities w/ force/impact through UE; work or perform other regular daily activities due to UE problem; UE problem has interfered quite a bit with normal social activities  UPPER EXTREMITY ROM     Active ROM Right Eval - 5/25 Left Eval - 5/25  Shoulder flexion 51 55  Shoulder abduction 50 58  Shoulder extension 40 47  Shoulder internal rotation 80 84  Shoulder external rotation 40 35  Elbow flexion 135 138  Elbow extension 41 6  (Blank rows = not tested)  UPPER EXTREMITY MMT:  BUE shoulder strength to be assessed in antigravity position  MMT Right Eval - 5/25 Left Eval - 5/25  Shoulder flexion    Shoulder abduction    Shoulder adduction    Shoulder extension    Shoulder internal rotation    Shoulder external rotation    Elbow flexion 4-/5 4-/5  Elbow extension 4-/5 4-/5  (Blank rows = not tested)  HAND FUNCTION: Unable to assess  at evaluation due to medical restrictions  COORDINATION: Grossly WFL  SENSATION: WFL, per pt report  COGNITION: Overall cognitive status: Within functional limits for tasks assessed  VISION: Subjective report: No concerns Baseline vision: Wears glasses all the time  VISION ASSESSMENT: Tracking/Visual pursuits: Able to track stimulus in all quads without difficulty Saccades: WFL Convergence: WFL Visual Fields: no apparent deficits  OBSERVATIONS: Observable hematoma w/ ecchymosis to L forehead   TODAY'S TREATMENT:  N/A   PATIENT EDUCATION: Education provided on role and purpose of OT, as well as potential interventions and goals for therapy based on initial evaluation findings. Also reviewed sign/symptoms to warrant seeking additional medical care s/p fall w/ pt verbalizing understanding. Person educated: Patient Education method: Explanation Education comprehension: verbalized understanding   HOME EXERCISE PROGRAM: To be adminsitered   GOALS: Goals reviewed with patient? Yes  SHORT TERM GOALS: Target date: 03/31/22  STG  Status:  1 Pt will verbalize understanding of at least 3 fall prevention and/or energy conservation strategies to increase safety w/ functional mobility at home Baseline: Decreased knowledge of strategies INITIAL  2 Pt will demonstrate independence w/ recommended DME and/or AE (e.g., long-handle sponge, dressing stick, etc.) to improve participation and safety w/ ADLs Baseline: Decreased knowledge of AE INITIAL  3 Pt will improve functional use of BUEs as evidence by being able to achieve at least 70 degrees of shoulder flexion w/out pain Baseline: RUE: 51*, LUE: 55* INITIAL  4 Pt will improve functional bilateral reach by increasing R elbow extension to at least 25 degrees Baseline: 41* of flexion when attempting extension INITIAL    LONG TERM GOALS: Target date: 05/12/22  LTG  Status:  1 Pt will demonstrate independence w/ full HEP designed for  ROM and strength of BUEs Baseline: No HEP at this time INITIAL  2 Pt will improve QuickDASH score by at least 19 points to indicate mild functional impairment Baseline: 59.1 (moderate, borderline severe) impairment) INITIAL  3 Pt will improve functional use of BUEs as evidence by improving strength of BUE shoulder flexion to at least 3+/5  Baseline: To be assessed INITIAL  4 With both hands, pt will be able to grip w/ at least 20 lbs of force to improve functional use of BUEs during IADLs Baseline: Unable to assess at eval due to medical restrictions INITIAL  5 Pt will be able to retrieve lightweight object (< 5 lbs) from overhead height w/out pain by d/c Baseline: Unable to reach overhead INITIAL    ASSESSMENT:  CLINICAL IMPRESSION: Pt is a 66 y/o male who presents to OP OT due to generalized weakness, shoulder pain, and repeated falls. Pt was hospitalized 4/3-12/29/21 for NSTEMI s/p PCI and drug-eluting stent placement. On 02/02/22, pt returned to ED due to progressive extremity weakness w/ symptoms concerning for inflammatory myopathy and MRI revealing acute R pontine infarct (small silent stroke; pt not symptomatic). Pt was transferred to IP rehab 02/08/22 before being discharged home 02/16/22. Pt was also being followed for bilateral shoulder pain (R > L) prior to recent hospitalizations and has a follow-up  scheduled for 02/23/22. Additional PMH includes CAD, HTN, HLD, T2DM, nephrolithiasis, transaminitis, h/o stage I prostate cancer, and R TKA in 2016. Pt currently lives alone and was working prior to hospitalization for NSTEMI in April. Pt will benefit from skilled occupational therapy services to address strength, ROM, pain management, balance, functional mobility, introduction of compensatory strategies/AE prn, and implementation of an HEP to improve participation and safety during ADLs and hopeful return to PLOF.  PERFORMANCE DEFICITS in functional skills including ADLs, IADLs, ROM, strength,  pain, GMC, mobility, balance, body mechanics, endurance, cardiopulmonary status limiting function, decreased knowledge of precautions, decreased knowledge of use of DME, and UE functional use.   IMPAIRMENTS are limiting patient from ADLs, IADLs, rest and sleep, work, leisure, and social participation.   COMORBIDITIES has co-morbidities such as NSTEMI, CAD, and DM  that affects occupational performance. Patient will benefit from skilled OT to address above impairments and improve overall function.  MODIFICATION OR ASSISTANCE TO COMPLETE EVALUATION: Min-Moderate modification of tasks or assist with assess necessary to complete an evaluation.  OT OCCUPATIONAL PROFILE AND HISTORY: Detailed assessment: Review of records and additional review of physical, cognitive, psychosocial history related to current functional performance.  CLINICAL DECISION MAKING: Moderate - several treatment options, min-mod task modification necessary  REHAB POTENTIAL: Good  EVALUATION COMPLEXITY: Moderate   PLAN: OT FREQUENCY: 2x/week  OT DURATION: 10 weeks  PLANNED INTERVENTIONS: self care/ADL training, therapeutic exercise, therapeutic activity, manual therapy, passive range of motion, functional mobility training, aquatic therapy, moist heat, cryotherapy, patient/family education, energy conservation, DME and/or AE instructions, and Re-evaluation  RECOMMENDED OTHER SERVICES: Currently receiving PT services at this location  CONSULTED AND AGREED WITH PLAN OF CARE: Patient  PLAN FOR NEXT SESSION: Review fall prevention and energy conservation strategies; initiate light BUE strengthening   Kathrine Cords, MSOT, OTR/L 02/17/2022, 4:51 PM

## 2022-02-19 ENCOUNTER — Emergency Department (HOSPITAL_COMMUNITY): Payer: Medicare Other

## 2022-02-19 ENCOUNTER — Emergency Department (HOSPITAL_COMMUNITY)
Admission: EM | Admit: 2022-02-19 | Discharge: 2022-02-19 | Disposition: A | Discharge status: Home / Self Care | Payer: Medicare Other | Attending: Emergency Medicine | Admitting: Emergency Medicine

## 2022-02-19 ENCOUNTER — Encounter (HOSPITAL_COMMUNITY): Payer: Self-pay | Admitting: Emergency Medicine

## 2022-02-19 DIAGNOSIS — Z7982 Long term (current) use of aspirin: Secondary | ICD-10-CM | POA: Diagnosis not present

## 2022-02-19 DIAGNOSIS — S0993XA Unspecified injury of face, initial encounter: Secondary | ICD-10-CM | POA: Diagnosis present

## 2022-02-19 DIAGNOSIS — Z7984 Long term (current) use of oral hypoglycemic drugs: Secondary | ICD-10-CM | POA: Insufficient documentation

## 2022-02-19 DIAGNOSIS — Z79899 Other long term (current) drug therapy: Secondary | ICD-10-CM | POA: Insufficient documentation

## 2022-02-19 DIAGNOSIS — S0083XA Contusion of other part of head, initial encounter: Secondary | ICD-10-CM | POA: Diagnosis not present

## 2022-02-19 DIAGNOSIS — I2510 Atherosclerotic heart disease of native coronary artery without angina pectoris: Secondary | ICD-10-CM | POA: Diagnosis not present

## 2022-02-19 DIAGNOSIS — S0003XA Contusion of scalp, initial encounter: Secondary | ICD-10-CM | POA: Diagnosis not present

## 2022-02-19 DIAGNOSIS — W182XXA Fall in (into) shower or empty bathtub, initial encounter: Secondary | ICD-10-CM | POA: Diagnosis not present

## 2022-02-19 DIAGNOSIS — Z794 Long term (current) use of insulin: Secondary | ICD-10-CM | POA: Insufficient documentation

## 2022-02-19 DIAGNOSIS — E119 Type 2 diabetes mellitus without complications: Secondary | ICD-10-CM | POA: Diagnosis not present

## 2022-02-19 DIAGNOSIS — W19XXXA Unspecified fall, initial encounter: Secondary | ICD-10-CM

## 2022-02-19 DIAGNOSIS — I10 Essential (primary) hypertension: Secondary | ICD-10-CM | POA: Diagnosis not present

## 2022-02-19 DIAGNOSIS — S0011XA Contusion of right eyelid and periocular area, initial encounter: Secondary | ICD-10-CM | POA: Diagnosis not present

## 2022-02-19 IMAGING — CT CT HEAD W/O CM
3 of 4 series · 13 of 47 positions shown, 15 images · non-contrast
Comparison: Head CT [DATE].  Brain MRI [DATE].

CLINICAL DATA: Head trauma.  Left frontal scalp hematoma.



[Series 2: head without · axial · non-contrast · 0.47mm/px · z∈[-83,+37]mm · 7 of 33 slices shown, 9 images]
[im 5/33  brain]
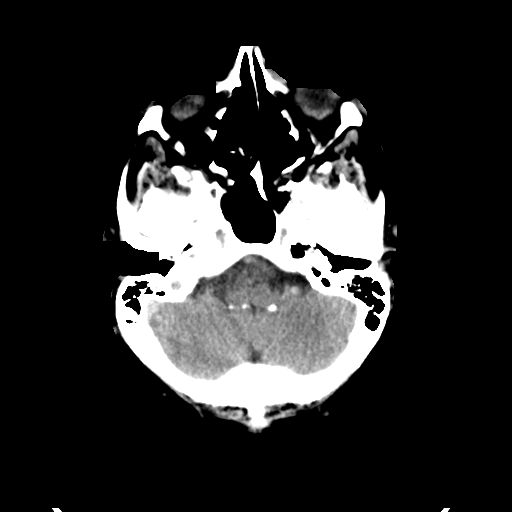
[im 5/33  bone]
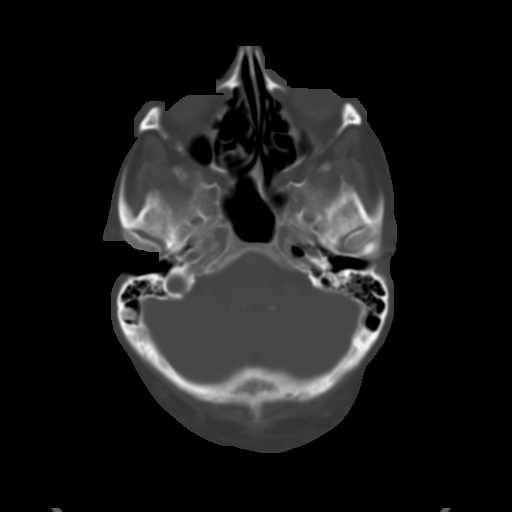
[im 9/33  brain]
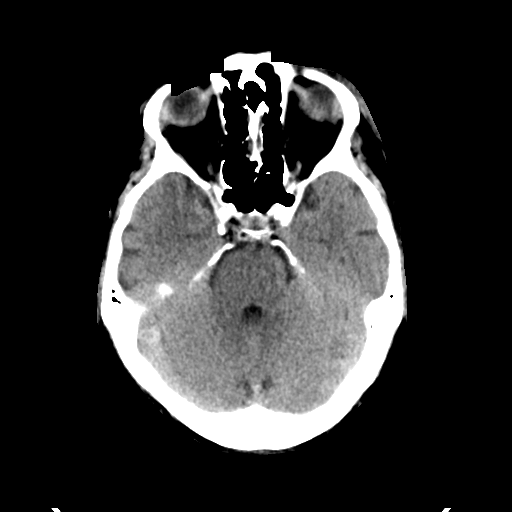
[im 13/33  brain]
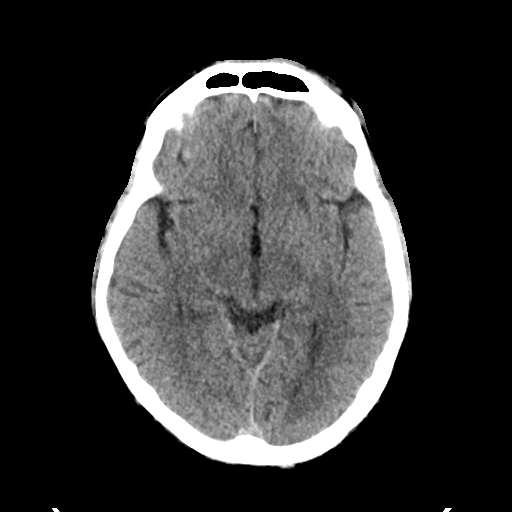
[im 17/33  brain]
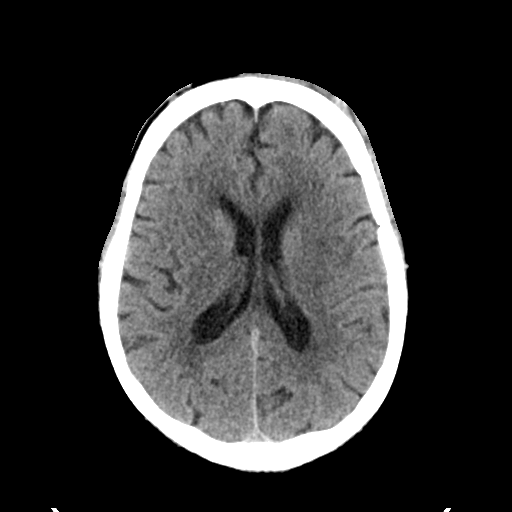
[im 21/33  brain]
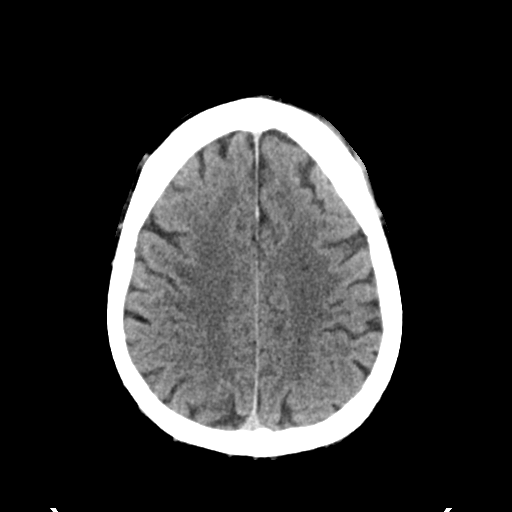
[im 21/33  bone]
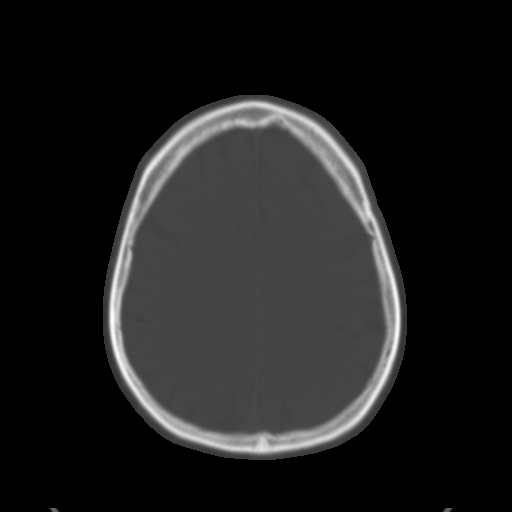
[im 25/33  brain]
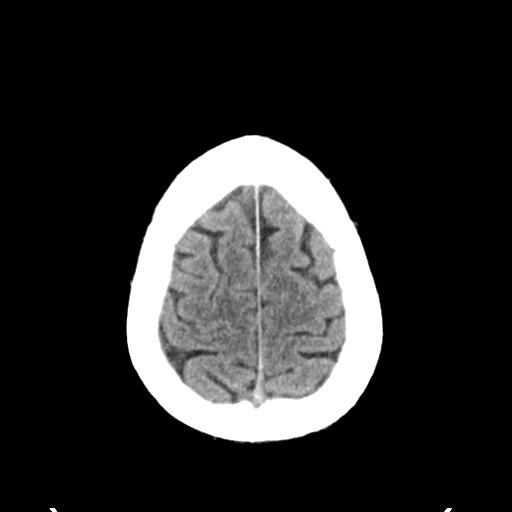
[im 29/33  brain]
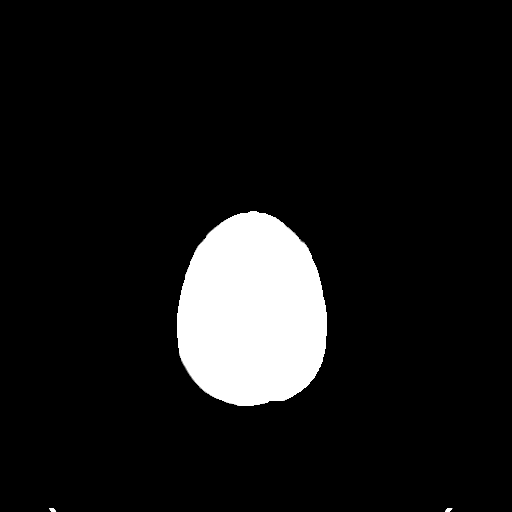

[Series 4: head without cor · coronal · non-contrast · 0.32mm/px · 3 of 81 slices shown]
[im 27/81  brain]
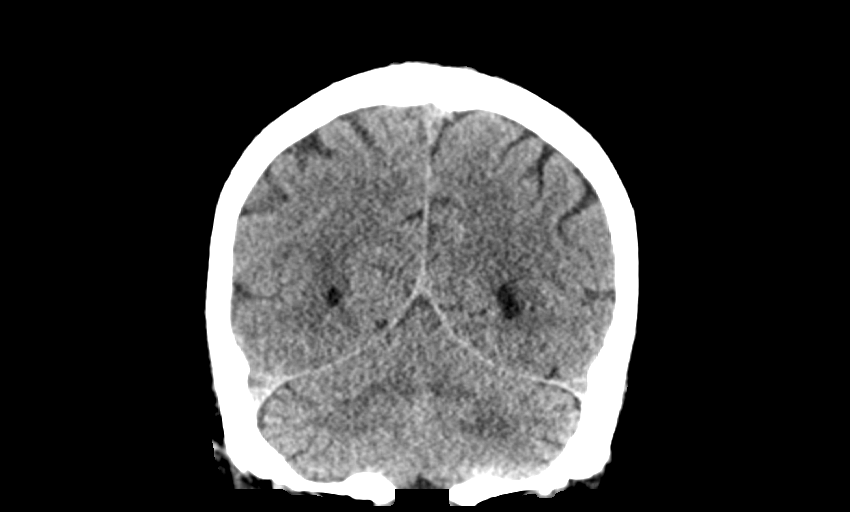
[im 36/81  brain]
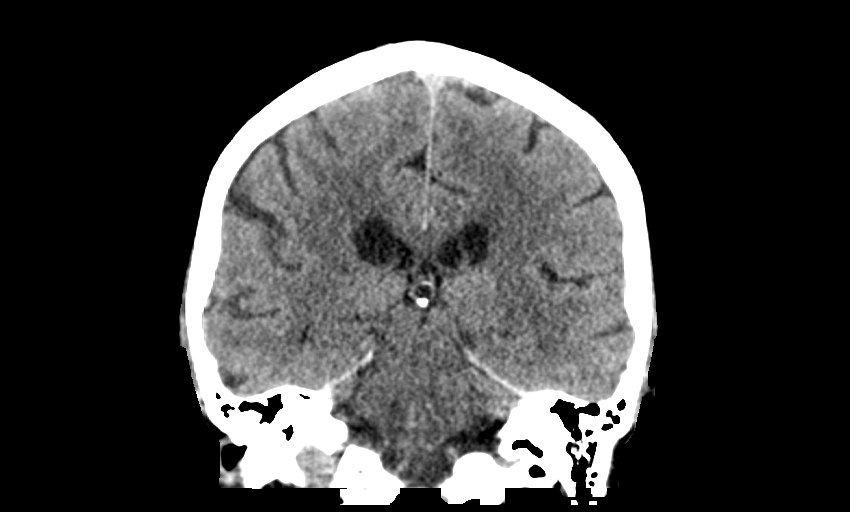
[im 45/81  brain]
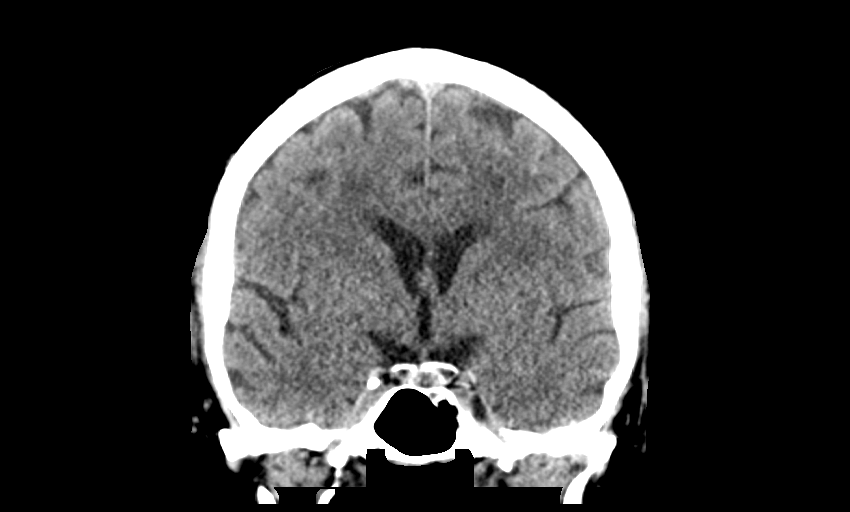

[Series 5: head without sag · sagittal · non-contrast · 0.32mm/px · 3 of 67 slices shown]
[im 23/67  brain]
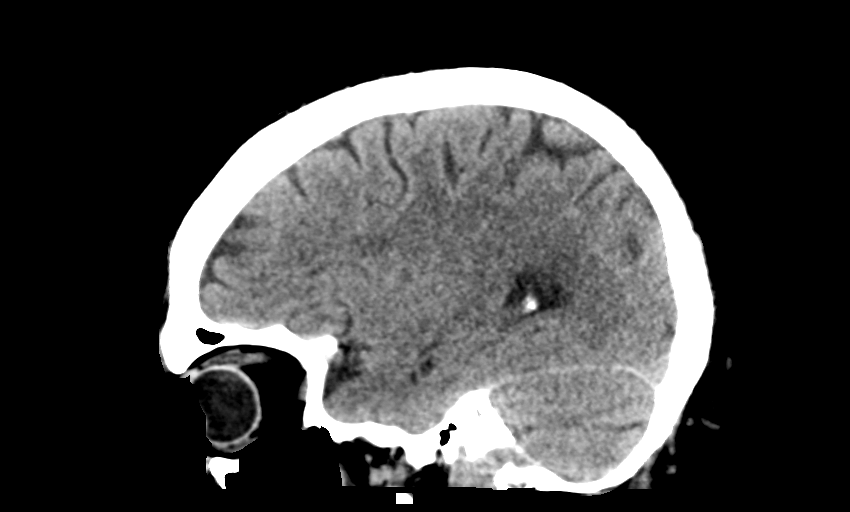
[im 34/67  brain]
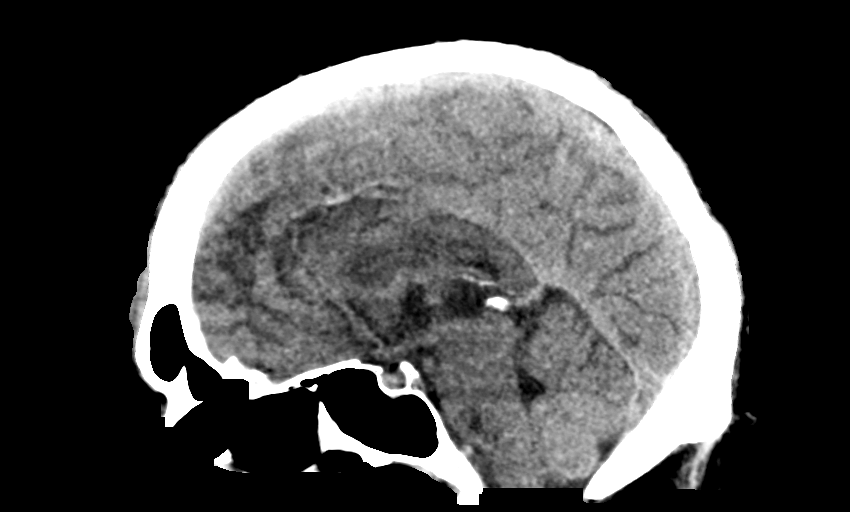
[im 45/67  brain]
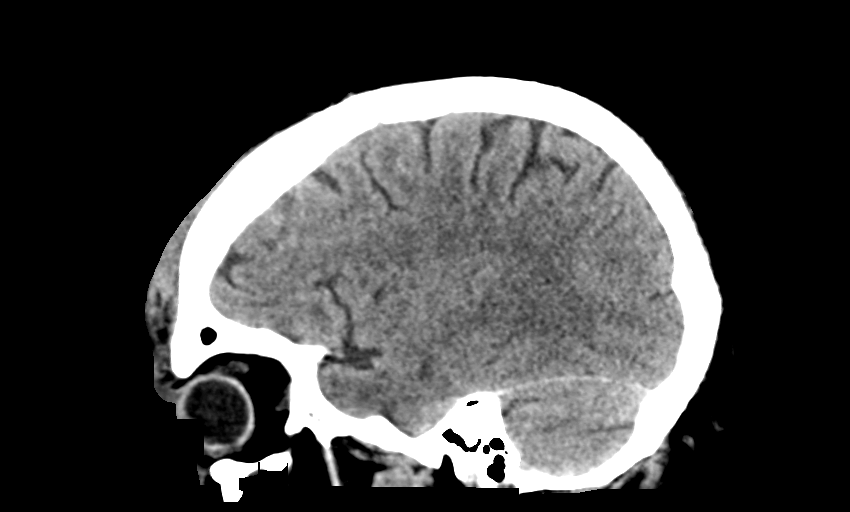

[13 of 47 positions shown; findings below may reference images not displayed]

FINDINGS: Brain: There is no evidence for acute hemorrhage, hydrocephalus,
mass lesion, or abnormal extra-axial fluid collection. No definite
CT evidence for acute infarction.

Vascular: No hyperdense vessel or unexpected calcification.

Skull: No evidence for fracture. No worrisome lytic or sclerotic
lesion.

Sinuses/Orbits: The visualized paranasal sinuses and mastoid air
cells are clear. Visualized portions of the globes and intraorbital
fat are unremarkable.

Other: None.
IMPRESSION: No acute intracranial abnormality. The right pontine infarct seen on
previous brain MRIs not readily evident by CT today.

## 2022-02-19 NOTE — Discharge Instructions (Signed)
Recheck with your primary care provider in the coming week.  Return to the emergency room for worsening or concerning symptoms.

## 2022-02-19 NOTE — TOC Initial Note (Signed)
Transition of Care [facility]) - Initial/Assessment Note    Patient Details  Name: [name] MRN: [id number] Date of Birth: 12/19/1955  Transition of Care [facility]) CM/SW Contact:    Verdell Carmine, RN Phone Number: 02/19/2022, 10:31 AM  Clinical Narrative:                  Patient just discharged from IP rehab and seen in the outpatient PT setting. . Continue with OP PT.    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 [place] [address] Phone: [phone] Fax: [phone]     Social Determinants of Health (SDOH) Interventions    Readmission Risk Interventions     View : No data to display.

## 2022-02-19 NOTE — ED Triage Notes (Signed)
Pt states he was discharged from hospital on Wednesday. Wednesday night he went to use the bathroom with the lights off and fell face first into the tub. Pt has swelling and hematoma to left eye and forehead.

## 2022-02-19 NOTE — ED Provider Notes (Signed)
[facility] EMERGENCY DEPARTMENT Provider Note   CSN: [id number] Arrival date & time: 02/19/22  9417     History  Chief Complaint  Patient presents with   [name] is a 66 y.o. male.  66 year old male on Brilinta, recent MI, CVA, also history of hypertension and diabetes presents with concern for worsening swelling around his left eye.  Patient states that he fell on Wednesday and hit his head on the bathtub resulting in a goose egg over his left forehead.  Since that time, patient has developed progressively worsening bruising and swelling to his left orbital area.  He denies pain in his eye or visual changes.  Patient woke up this morning with increased bruising over the left eyelid which prompted him to come to the emergency room.  He denies any other injuries, complaints, concerns.      Home Medications Prior to Admission medications   Medication Sig Start Date End Date Taking? Authorizing Provider  acetaminophen (TYLENOL) 325 MG tablet Take 1-2 tablets (325-650 mg total) by mouth every 4 (four) hours as needed for mild pain. 02/16/22   Angiulli, Lavon Paganini, PA-C  amLODipine (NORVASC) 10 MG tablet Take 1 tablet (10 mg total) by mouth every morning. 02/16/22   Angiulli, Lavon Paganini, PA-C  aspirin 81 MG EC tablet Take 1 tablet (81 mg total) by mouth daily. Swallow whole. 12/30/21   Leanor Kail, PA  carvedilol (COREG) 12.5 MG tablet Take 1 tablet (12.5 mg total) by mouth 2 (two) times daily with a meal. 02/16/22   Angiulli, Lavon Paganini, PA-C  cholecalciferol (VITAMIN D) 1000 units tablet Take 1 tablet (1,000 Units total) by mouth daily. 02/16/22   Angiulli, Lavon Paganini, PA-C  citalopram (CELEXA) 20 MG tablet Take 1 tablet (20 mg total) by mouth daily. 02/16/22   Angiulli, Lavon Paganini, PA-C  Cyanocobalamin (VITAMIN B-12) 2500 MCG SUBL Place 1 tablet (2,500 mcg total) under the tongue daily. 02/16/22   Angiulli, Lavon Paganini, PA-C  cyclobenzaprine (FLEXERIL) 10 MG tablet  Take 1 tablet (10 mg total) by mouth at bedtime. 02/16/22   Angiulli, Lavon Paganini, PA-C  diclofenac Sodium (VOLTAREN) 1 % GEL Apply 2 g topically 4 (four) times daily. 02/16/22   Angiulli, Lavon Paganini, PA-C  Evolocumab (REPATHA SURECLICK) 408 MG/ML SOAJ Inject 1 pen. into the skin every 14 (fourteen) days. 02/02/22   Chandrasekhar, Mahesh A, MD  insulin glargine (LANTUS) 100 UNIT/ML Solostar Pen Inject 12 Units into the skin 2 (two) times daily. 02/16/22   Angiulli, Lavon Paganini, PA-C  lisinopril (ZESTRIL) 40 MG tablet Take 1 tablet (40 mg total) by mouth every morning. 02/16/22   Angiulli, Lavon Paganini, PA-C  metFORMIN (GLUCOPHAGE) 500 MG tablet Take 2 tablets (1,000 mg total) by mouth 2 (two) times daily with a meal. 02/16/22   Angiulli, Lavon Paganini, PA-C  Multiple Vitamins-Minerals (MULTIVITAMINS THER. W/MINERALS) TABS Take 1 tablet by mouth daily.    [provider]  nitroGLYCERIN (NITROSTAT) 0.4 MG SL tablet Place 1 tablet (0.4 mg total) under the tongue every 5 (five) minutes x 3 doses as needed for chest pain. 02/16/22   Angiulli, Lavon Paganini, PA-C  Omega-3 Fatty Acids (FISH OIL) 1200 MG CAPS Take 1,200 mg by mouth daily.    [provider]  predniSONE (DELTASONE) 10 MG tablet Take 7 tablets (70 mg total) by mouth daily with breakfast for 6 days, THEN 6 tablets (60 mg total) daily with breakfast for 7 days, THEN 5  tablets (50 mg total) daily with breakfast for 7 days, THEN 4 tablets (40 mg total) daily with breakfast for 7 days, THEN 3 tablets (30 mg total) daily with breakfast for 7 days, THEN 2 tablets (20 mg total) daily with breakfast for 7 days, THEN 1 tablet (10 mg total) daily with breakfast. 02/16/22 04/28/22  Angiulli, Lavon Paganini, PA-C  ticagrelor (BRILINTA) 90 MG TABS tablet Take 1 tablet (90 mg total) by mouth 2 (two) times daily. 02/16/22   Angiulli, Lavon Paganini, PA-C      Allergies    Atorvastatin calcium    Review of Systems   Review of Systems Negative except as per HPI Physical Exam Updated  Vital Signs BP (!) 144/77 (BP Location: Left Arm)   Pulse 77   Temp 99 F (37.2 C) (Oral)   Resp 16   Ht '5\' 10"'$  (1.778 m)   Wt 81.6 kg   SpO2 95%   BMI 25.81 kg/m  Physical Exam Vitals and nursing note reviewed.  Constitutional:      General: He is not in acute distress.    Appearance: He is well-developed. He is not diaphoretic.  HENT:     Head: Normocephalic.      Comments: Left frontal hematoma with ecchymosis extending to left orbital area, significant ecchymosis to left upper eye lid.    Mouth/Throat:     Mouth: Mucous membranes are moist.  Eyes:     Extraocular Movements: Extraocular movements intact.     Conjunctiva/sclera: Conjunctivae normal.     Pupils: Pupils are equal, round, and reactive to light.  Pulmonary:     Effort: Pulmonary effort is normal.  Musculoskeletal:     Cervical back: Normal range of motion and neck supple. No tenderness.  Skin:    General: Skin is warm and dry.     Findings: No erythema.  Neurological:     Mental Status: He is alert and oriented to person, place, and time.     Cranial Nerves: No cranial nerve deficit.  Psychiatric:        Behavior: Behavior normal.    ED Results / Procedures / Treatments   Labs (all labs ordered are listed, but only abnormal results are displayed) Labs Reviewed - No data to display  EKG None  Radiology CT Head Wo Contrast  Result Date: 02/19/2022 CLINICAL DATA:  Head trauma.  Left frontal scalp hematoma. EXAM: CT HEAD WITHOUT CONTRAST TECHNIQUE: Contiguous axial images were obtained from the base of the skull through the vertex without intravenous contrast. RADIATION DOSE REDUCTION: This exam was performed according to the departmental dose-optimization program which includes automated exposure control, adjustment of the mA and/or kV according to patient size and/or use of iterative reconstruction technique. COMPARISON:  Head CT 02/04/2022.  Brain MRI 02/03/2022. FINDINGS: Brain: There is no evidence  for acute hemorrhage, hydrocephalus, mass lesion, or abnormal extra-axial fluid collection. No definite CT evidence for acute infarction. Vascular: No hyperdense vessel or unexpected calcification. Skull: No evidence for fracture. No worrisome lytic or sclerotic lesion. Sinuses/Orbits: The visualized paranasal sinuses and mastoid air cells are clear. Visualized portions of the globes and intraorbital fat are unremarkable. Other: None. IMPRESSION: No acute intracranial abnormality. 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 Electronically Signed   By: Misty Stanley M.D.   On: 02/19/2022 10:15    Procedures Procedures    Medications Ordered in ED Medications - No data to display  ED Course/ Medical  Decision Making/ A&P                           Medical Decision Making Amount and/or Complexity of Data Reviewed Radiology: ordered.   This patient presents to the ED for concern of left orbital swelling after fall and hit head 3 days ago, this involves an extensive number of treatment options, and is a complaint that carries with it a high risk of complications and morbidity.  The differential diagnosis includes but not limited to skull fracture, intracranial injury, hematoma   Co morbidities that complicate the patient evaluation  Diabetes, NSTEMI, hyperlipidemia, CAD, polymyositis   Additional history obtained:  Additional history obtained from friend at bedside who notes concern for worsening swelling/bruising External records from outside source obtained and reviewed including discharge summary from 02/16/2022 for admission for polymyositis with incidental finding on brain MRI of acute right pontine infarct, significant atherosclerosis.  Patient's myopathy was treated with 3-day course of Solu-Medrol and IV fluids.   Imaging Studies ordered:  I ordered imaging studies including CT head I independently visualized and interpreted imaging which  showed left frontal hematoma, left orbital hematoma.  No acute injury otherwise I agree with the radiologist interpretation  Consultations Obtained:  I requested consultation with the ER attending, Dr. Pearline Cables,  and discussed lab and imaging findings as well as pertinent plan - they recommend: Noncon CT head   Problem List / ED Course / Critical interventions / Medication management  66 year old male with fall on Brilinta 3 days ago in which he hit his head on the bathtub.  No LOC at that time, felt like he was fine other than a goose egg on his forehead however has had progressive bruising and swelling around his left eye that concerned him and prompted him to come in today.  Extraocular wounds intact, pupils equal and reactive, conjunctiva clear, no evidence of subconjunctival hemorrhage at this time.  CT of the head was ordered for fall on thinners and is negative for acute intracranial injury.  Discussed with patient importance of presenting to the ER should he fall and hit his head again.  For this episode, he can recheck with his primary care provider.  Advised that we will take a long time for this hematoma to resolve.  I have reviewed the patients home medicines and have made adjustments as needed   Social Determinants of Health:  Has PCP for follow-up   Test / Admission - Considered:  Consider CBC to check H&H however without further ongoing bleeding, and stable vitals, do not feel this is necessary at this time.         Final Clinical Impression(s) / ED Diagnoses Final diagnoses:  Fall, initial encounter  Facial hematoma, initial encounter    Rx / DC Orders ED Discharge Orders     None         Tacy Learn, PA-C 02/19/22 [place], DO 02/19/22 1204

## 2022-02-23 ENCOUNTER — Encounter: Payer: Self-pay | Admitting: Orthopedic Surgery

## 2022-02-23 ENCOUNTER — Ambulatory Visit: Payer: Medicare Other | Admitting: Orthopedic Surgery

## 2022-02-23 DIAGNOSIS — M25511 Pain in right shoulder: Secondary | ICD-10-CM

## 2022-02-23 DIAGNOSIS — M25512 Pain in left shoulder: Secondary | ICD-10-CM | POA: Diagnosis not present

## 2022-02-23 NOTE — Progress Notes (Signed)
Office Visit Note   Patient: [name]           Date of Birth: 1956-06-03           MRN: [id number] Visit Date: 02/23/2022 Requested by: Greig Right, MD [address] [place],  [place] [address] PCP: Greig Right, MD  Subjective: Chief Complaint  Patient presents with   Right Shoulder - Follow-up   Left Shoulder - Follow-up    HPI: [name] is a 66 year old patient with bilateral shoulder pain right worse than left.  Had an MRI scan previously which showed partial-thickness supraspinatus tear.  Here for clinical recheck to guide intervention.  Since he was last seen he has had heart attack in April and had 3 stents placed.  He had a stroke 7 days after which was mild.  Currently he is on Brilinta as an anticoagulant.  He also has been losing a lot of muscle mass and strength.  Using cane for ambulation.  Has been doing physical therapy and Occupational Therapy.  Has a muscle biopsy for myopathy/minus situs in about 5 weeks.  He is currently on prednisone taper and states that he is getting stronger.              ROS: All systems reviewed are negative as they relate to the chief complaint within the history of present illness.  Patient denies  fevers or chills.   Assessment & Plan: Visit Diagnoses:  1. Bilateral shoulder pain, unspecified chronicity     Plan: Impression is bilateral shoulder pain but no real coarse grinding or crepitus on the right-hand side today to indicate progression of that rotator cuff tear which was partial near full-thickness tear several months ago.  Strength is symmetrically decreased across all muscle groups consistent with his myopathy.  Plan is to let the dust settle and 70s medical problems first but no clear indication for any type of surgical intervention on the right shoulder at this time.  Come back in 6 months for clinical recheck to evaluate the rotator cuff in the absence of this myopathy.  Follow-Up Instructions: Return in  about 6 months (around 08/25/2022).   Orders:  No orders of the defined types were placed in this encounter.  No orders of the defined types were placed in this encounter.     Procedures: No procedures performed   Clinical Data: No additional findings.  Objective: Vital Signs: There were no vitals taken for this visit.  Physical Exam:   Constitutional: Patient appears well-developed HEENT:  Head: Normocephalic Eyes:EOM are normal Neck: Normal range of motion Cardiovascular: Normal rate Pulmonary/chest: Effort normal Neurologic: Patient is alert Skin: Skin is warm Psychiatric: Patient has normal mood and affect   Ortho Exam: Ortho exam demonstrates active forward flexion around 100 degrees bilaterally.  Has generally symmetric diminished grip EPL FPL interosseous resection extension bicep triceps and deltoid strength about 4+ out of 5 and 5- out of 5 bilaterally.  Patient rotation strength also weak at 4 out of 5 bilaterally.  No coarse grinding or crepitus on the right left-hand side with internal/external rotation of the arm at 90 degrees of abduction.  Specialty Comments:  No specialty comments available.  Imaging: No results found.   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   Elevated CK 02/02/2022   Elevated LFTs 02/02/2022   CAD (coronary artery disease) 02/02/2022   Muscle weakness of all 4 extremities 02/02/2022  Hyperlipidemia 01/28/2022   NSTEMI (non-ST elevated myocardial infarction) ([place]) 12/27/2021   Arthritis of knee 09/02/2014   Staghorn kidney stones 03/17/2014   Nephrolithiasis 02/01/2014   Pneumonia 08/27/2011   Type 2 diabetes mellitus ([place]) 08/27/2011   Past Medical History:  Diagnosis Date   Arthritis    Diabetes mellitus    fasting 120s   History of kidney stones    Hypertension    Peripheral neuropathy    left fifth finger, left toe   Pneumonia    hx of    Family  History  Problem Relation Age of Onset   Alzheimer's disease Mother    Heart attack Father     Past Surgical History:  Procedure Laterality Date   APPENDECTOMY     CORONARY STENT INTERVENTION N/A 12/28/2021   Procedure: CORONARY STENT INTERVENTION;  Surgeon: Jettie Booze, MD;  Location: [place] [facility];  Service: Cardiovascular;  Laterality: N/A;   CYSTOSCOPY W/ URETERAL STENT PLACEMENT Bilateral 03/17/2014   Procedure: CYSTOSCOPY WITH RIGHT STENT PULL  ;  Surgeon: Alexis Frock, MD;  Location: [facility];  Service: Urology;  Laterality: Bilateral;   CYSTOSCOPY WITH RETROGRADE PYELOGRAM, URETEROSCOPY AND STENT PLACEMENT Bilateral 02/01/2014   Procedure: CYSTOSCOPY WITH BILATERAL RETROGRADE CYSTOSCOPY, BILATERAL RETROGRADE, PYELOGRAM, RIGHT URETEROSCOPY, STONE BASKETRY,  AND BILATERAL STENT PLACEMENT;  Surgeon: Alexis Frock, MD;  Location: [facility];  Service: Urology;  Laterality: Bilateral;   CYSTOSCOPY WITH URETEROSCOPY AND STENT PLACEMENT Left 02/12/2021   Procedure: CYSTOSCOPY WITH LEFT URETEROSCOPY, BALLOON DILITATION, HOLMIUM LASER LITHOTRIPSY AND STENT PLACEMENT;  Surgeon: Lucas Mallow, MD;  Location: [facility];  Service: Urology;  Laterality: Left;   HOLMIUM LASER APPLICATION Left 9/98/3382   Procedure: HOLMIUM LASER APPLICATION;  Surgeon: Alexis Frock, MD;  Location: [facility];  Service: Urology;  Laterality: Left;   INTRAVASCULAR ULTRASOUND/IVUS N/A 12/28/2021   Procedure: Intravascular Ultrasound/IVUS;  Surgeon: Jettie Booze, MD;  Location: [place] [facility];  Service: Cardiovascular;  Laterality: N/A;   LEFT HEART CATH AND CORONARY ANGIOGRAPHY N/A 12/28/2021   Procedure: LEFT HEART CATH AND CORONARY ANGIOGRAPHY;  Surgeon: Jettie Booze, MD;  Location: [place] [facility];  Service: Cardiovascular;  Laterality: N/A;   LITHOTRIPSY     NEPHROLITHOTOMY Left 03/17/2014   Procedure: NEPHROLITHOTOMY PERCUTANEOUS with access;  Surgeon: Alexis Frock, MD;  Location: [facility];   Service: Urology;  Laterality: Left;   NEPHROLITHOTOMY Left 03/19/2014   Procedure: 2ND STAGE NEPHROLITHOTOMY PERCUTANEOUS left ureteroscopy with lasr stone treatment and stent placement, with access;  Surgeon: Alexis Frock, MD;  Location: [facility];  Service: Urology;  Laterality: Left;   TOTAL KNEE ARTHROPLASTY Right 09/02/2014   Procedure: RIGHT TOTAL KNEE ARTHROPLASTY;  Surgeon: Meredith Pel, MD;  Location: [place];  Service: Orthopedics;  Laterality: Right;   uretral stent     Social History   Occupational History   Not on file  Tobacco Use   Smoking status: Never   Smokeless tobacco: Never  Vaping Use   Vaping Use: Never used  Substance and Sexual Activity   Alcohol use: Yes    Alcohol/week: 2.0 standard drinks    Types: 2 Cans of beer per week    Comment: occasional/ 1-2 beers week   Drug use: No   Sexual activity: Yes    Birth control/protection: Other-see comments

## 2022-02-24 ENCOUNTER — Ambulatory Visit: Payer: Medicare Other | Admitting: Occupational Therapy

## 2022-02-24 ENCOUNTER — Ambulatory Visit: Payer: Medicare Other | Attending: Physician Assistant

## 2022-02-24 ENCOUNTER — Encounter: Payer: Self-pay | Admitting: Occupational Therapy

## 2022-02-24 DIAGNOSIS — M25511 Pain in right shoulder: Secondary | ICD-10-CM

## 2022-02-24 DIAGNOSIS — R262 Difficulty in walking, not elsewhere classified: Secondary | ICD-10-CM | POA: Insufficient documentation

## 2022-02-24 DIAGNOSIS — R296 Repeated falls: Secondary | ICD-10-CM

## 2022-02-24 DIAGNOSIS — R278 Other lack of coordination: Secondary | ICD-10-CM | POA: Diagnosis not present

## 2022-02-24 DIAGNOSIS — M3320 Polymyositis, organ involvement unspecified: Secondary | ICD-10-CM | POA: Diagnosis not present

## 2022-02-24 DIAGNOSIS — R2681 Unsteadiness on feet: Secondary | ICD-10-CM | POA: Diagnosis not present

## 2022-02-24 DIAGNOSIS — M25512 Pain in left shoulder: Secondary | ICD-10-CM | POA: Diagnosis not present

## 2022-02-24 DIAGNOSIS — R2689 Other abnormalities of gait and mobility: Secondary | ICD-10-CM | POA: Diagnosis not present

## 2022-02-24 DIAGNOSIS — M6281 Muscle weakness (generalized): Secondary | ICD-10-CM | POA: Diagnosis not present

## 2022-02-24 NOTE — Therapy (Addendum)
OUTPATIENT PHYSICAL THERAPY LOWER EXTREMITY TREATMENT   Patient Name: [name] MRN: [id number] DOB:06-05-1956, 66 y.o., male Today's Date: 02/24/2022   PT End of Session - 02/24/22 0917     Visit Number 2    PT Start Time 0915    PT Stop Time 1000    PT Time Calculation (min) 45 min    Equipment Utilized During Treatment Gait belt    Activity Tolerance Patient tolerated treatment well    Behavior During Therapy WFL for tasks assessed/performed              Past Medical History:  Diagnosis Date   Arthritis    Diabetes mellitus    fasting 120s   History of kidney stones    Hypertension    Peripheral neuropathy    left fifth finger, left toe   Pneumonia    hx of   Past Surgical History:  Procedure Laterality Date   APPENDECTOMY     CORONARY STENT INTERVENTION N/A 12/28/2021   Procedure: CORONARY STENT INTERVENTION;  Surgeon: Jettie Booze, MD;  Location: [facility];  Service: Cardiovascular;  Laterality: N/A;   CYSTOSCOPY W/ URETERAL STENT PLACEMENT Bilateral 03/17/2014   Procedure: CYSTOSCOPY WITH RIGHT STENT PULL  ;  Surgeon: Alexis Frock, MD;  Location: [facility];  Service: Urology;  Laterality: Bilateral;   CYSTOSCOPY WITH RETROGRADE PYELOGRAM, URETEROSCOPY AND STENT PLACEMENT Bilateral 02/01/2014   Procedure: CYSTOSCOPY WITH BILATERAL RETROGRADE CYSTOSCOPY, BILATERAL RETROGRADE, PYELOGRAM, RIGHT URETEROSCOPY, STONE BASKETRY,  AND BILATERAL STENT PLACEMENT;  Surgeon: Alexis Frock, MD;  Location: [facility];  Service: Urology;  Laterality: Bilateral;   CYSTOSCOPY WITH URETEROSCOPY AND STENT PLACEMENT Left 02/12/2021   Procedure: CYSTOSCOPY WITH LEFT URETEROSCOPY, BALLOON DILITATION, HOLMIUM LASER LITHOTRIPSY AND STENT PLACEMENT;  Surgeon: Lucas Mallow, MD;  Location: [facility];  Service: Urology;  Laterality: Left;   HOLMIUM LASER APPLICATION Left 6/37/8588   Procedure: HOLMIUM LASER APPLICATION;  Surgeon: Alexis Frock, MD;  Location: [facility];  Service:  Urology;  Laterality: Left;   INTRAVASCULAR ULTRASOUND/IVUS N/A 12/28/2021   Procedure: Intravascular Ultrasound/IVUS;  Surgeon: Jettie Booze, MD;  Location: [facility];  Service: Cardiovascular;  Laterality: N/A;   LEFT HEART CATH AND CORONARY ANGIOGRAPHY N/A 12/28/2021   Procedure: LEFT HEART CATH AND CORONARY ANGIOGRAPHY;  Surgeon: Jettie Booze, MD;  Location: [facility];  Service: Cardiovascular;  Laterality: N/A;   LITHOTRIPSY     NEPHROLITHOTOMY Left 03/17/2014   Procedure: NEPHROLITHOTOMY PERCUTANEOUS with access;  Surgeon: Alexis Frock, MD;  Location: [facility];  Service: Urology;  Laterality: Left;   NEPHROLITHOTOMY Left 03/19/2014   Procedure: 2ND STAGE NEPHROLITHOTOMY PERCUTANEOUS left ureteroscopy with lasr stone treatment and stent placement, with access;  Surgeon: Alexis Frock, MD;  Location: [facility];  Service: Urology;  Laterality: Left;   TOTAL KNEE ARTHROPLASTY Right 09/02/2014   Procedure: RIGHT TOTAL KNEE ARTHROPLASTY;  Surgeon: Meredith Pel, MD;  Location: [facility];  Service: Orthopedics;  Laterality: Right;   uretral stent     Patient Active Problem List   Diagnosis Date Noted   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   Elevated CK 02/02/2022   Elevated LFTs 02/02/2022   CAD (coronary artery disease) 02/02/2022   Muscle weakness of all 4 extremities 02/02/2022   Hyperlipidemia 01/28/2022   NSTEMI (non-ST elevated myocardial infarction) ([place]) 12/27/2021   Arthritis of knee 09/02/2014   Staghorn kidney stones 03/17/2014   Nephrolithiasis 02/01/2014   Pneumonia 08/27/2011   Type 2  diabetes mellitus ([place]) 08/27/2011    PCP: Greig Right, MD  REFERRING PROVIDER: Cathlyn Parsons, PA-C   REFERRING DIAG: M33.20 (ICD-10-CM) - Polymyositis, organ involvement unspecified   THERAPY DIAG:  Muscle weakness (generalized)  Other abnormalities of gait and mobility  Repeated falls  Polymyositis ([place])  Other lack of coordination  Rationale for  Evaluation and Treatment Rehabilitation  ONSET DATE: 02/15/2022   SUBJECTIVE:   SUBJECTIVE STATEMENT: Patient reports about his recent fall after he came home from the hospital. He twisted his feet at 4AM going to the bathroom and has a large contusion on his L forehead and orbital area that has been there for 7 days. Reports no pain. Following HEP 1hr a day, takes a break when he gets tired.   PERTINENT HISTORY: CANDIDO FLOTT is a 66 y.o. right-handed male with history of type 2 diabetes mellitus hypertension intolerance of statin due to abnormal LFTs (off for the past years), CAD status post DES to RCA 12/28/2021 (addition of arterial for statin 20 mg, aspirin/Brilinta),   PAIN:  Are you having pain? No  PRECAUTIONS: None  WEIGHT BEARING RESTRICTIONS No  FALLS:  Has patient fallen in last 6 months? Yes. Number of falls 5  LIVING ENVIRONMENT: Lives with: lives with their family and lives alone Lives in: House/apartment Stairs: No Has following equipment at home: Gilford Rile - 4 wheeled, shower chair, Grab bars, and None  OCCUPATION: Comptroller, office work, Government social research officer.  PLOF: Independent  PATIENT GOALS Increase strength, return to his active lifestyle. 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 Travels to attend concerts.   OBJECTIVE:   MUSCLE LENGTH: Hamstrings: Right 62 deg; Left 68 deg   POSTURE: rounded shoulders   LOWER EXTREMITY ROM: WFL throughout, R hip with mild     tightness.   LOWER EXTREMITY MMT:  MMT Right eval Left eval  Hip flexion 4- 3+  Hip extension 4- 3+  Hip abduction 4- 3+  Hip adduction    Hip internal rotation    Hip external rotation    Knee flexion 4 4-  Knee extension 4 4-  Ankle dorsiflexion 4 4  Ankle plantarflexion    Ankle inversion    Ankle eversion     (Blank rows = not tested)   FUNCTIONAL TESTS:    5x STS18.22 BERG 43/56   TODAY'S TREATMENT: 02/24/22 STS 2x10 (requires push off  from thighs) Walking with quad cane 2 laps w/CGA  2 way hip 2x10 Step ups on foam 2x10, 2 finger support, CGA Calf raises 2x10   PATIENT EDUCATION:  Education details: POC Person educated: Patient Education method: Explanation, Corporate treasurer cues, and Verbal cues Education comprehension: verbalized understanding, returned demonstration, verbal cues required, and tactile cues required    Patent educated about keeping lights on during the night for safety and to prevent future falls. Also educated about collection of HEP, and to not do all exercises but rather pick a few to do in order to avoid fatigue.  HOME EXERCISE PROGRAM: [id number]  ASSESSMENT:  CLINICAL IMPRESSION: Patient returns for PT treatment for polymyositis and following a fall. Patient presents with large knot and ecchymosis around L orbital area. He has packets of exercises from previous therapies and works on them every day, he was educated to pick a few every day and not to overwork himself. Patient tolerated treatment well and requires extended rest breaks between sets to catch his breath. Gait and balance activities requires CGA, step ups on  foam are more difficult when leading with LLE, has LOB and RLE catching on foam.   OBJECTIVE IMPAIRMENTS Abnormal gait, decreased activity tolerance, decreased balance, decreased coordination, decreased endurance, decreased mobility, difficulty walking, decreased strength, improper body mechanics, and postural dysfunction.   ACTIVITY LIMITATIONS carrying, lifting, bending, standing, squatting, stairs, transfers, bed mobility, and locomotion level  PARTICIPATION LIMITATIONS: meal prep, cleaning, laundry, driving, shopping, community activity, occupation, and yard work  PERSONAL FACTORS Past/current experiences are also affecting patient's functional outcome.   REHAB POTENTIAL: Good  CLINICAL DECISION MAKING: Evolving/moderate complexity  EVALUATION COMPLEXITY:  Moderate   GOALS: Goals reviewed with patient? Yes  SHORT TERM GOALS: Target date: 03/17/2022   I with basic HEP Baseline: Goal status: INITIAL    LONG TERM GOALS: Target date: 05/12/2022   I with final HEP Baseline:  Goal status: INITIAL  2.  Patient will score at least 50/56 on BERG Balance Exam Baseline: 43 Goal status: INITIAL  3.  Paitent will complete 5x sit to stand in < 12 sec to demonstrate improved strength in BLE. Baseline:  Goal status: INITIAL  4.  Patient will ambulate at least 1000' with LRAD, MI, on level and unlevel surfaces Baseline:  Goal status: INITIAL  5.  Patient will be able to manage cooking and cleaning activities in his home without falling and without requiring a rest break. Baseline:  Goal status: INITIAL   PLAN: PT FREQUENCY: 2x/week  PT DURATION: 12 weeks  PLANNED INTERVENTIONS: Therapeutic exercises, Therapeutic activity, Neuromuscular re-education, Balance training, Gait training, Patient/Family education, Joint mobilization, Stair training, and Manual therapy  PLAN FOR NEXT SESSION: gait and balance activities, LE strengthening   Andris Baumann, DPT 02/24/2022, 10:14 AM

## 2022-02-24 NOTE — Patient Instructions (Addendum)
Precautions for your shoulder:  Avoid laying on your right shoulder while sleeping Avoid reaching overhead with your right arm, especially avoiding lifting any weight overhead Avoid reaching your right arm across your body toward your left side

## 2022-02-24 NOTE — Therapy (Signed)
OUTPATIENT OCCUPATIONAL THERAPY TREATMENT NOTE   Patient Name: [name] MRN: [id number] DOB:05-Aug-1956, 66 y.o., male Today's Date: 02/24/2022  PCP: Greig Right, MD REFERRING PROVIDER: Cathlyn Parsons, PA-C   END OF SESSION:   OT End of Session - 02/24/22 1007     Visit Number 2    Date for OT Re-Evaluation 05/12/22    Authorization Type BCBS Medicare    Authorization Time Period VL: [place]    OT Start Time 1004    OT Stop Time 1045    OT Time Calculation (min) 41 min    Activity Tolerance Patient tolerated treatment well    Behavior During Therapy WFL for tasks assessed/performed            Past Medical History:  Diagnosis Date   Arthritis    Diabetes mellitus    fasting 120s   History of kidney stones    Hypertension    Peripheral neuropathy    left fifth finger, left toe   Pneumonia    hx of   Past Surgical History:  Procedure Laterality Date   APPENDECTOMY     CORONARY STENT INTERVENTION N/A 12/28/2021   Procedure: CORONARY STENT INTERVENTION;  Surgeon: Jettie Booze, MD;  Location: [facility];  Service: Cardiovascular;  Laterality: N/A;   CYSTOSCOPY W/ URETERAL STENT PLACEMENT Bilateral 03/17/2014   Procedure: CYSTOSCOPY WITH RIGHT STENT PULL  ;  Surgeon: Alexis Frock, MD;  [facility];  Service: Urology;  Laterality: Bilateral;   CYSTOSCOPY WITH RETROGRADE PYELOGRAM, URETEROSCOPY AND STENT PLACEMENT Bilateral 02/01/2014   Procedure: CYSTOSCOPY WITH BILATERAL RETROGRADE CYSTOSCOPY, BILATERAL RETROGRADE, PYELOGRAM, RIGHT URETEROSCOPY, STONE BASKETRY,  AND BILATERAL STENT PLACEMENT;  Surgeon: Alexis Frock, MD;  [facility];  Service: Urology;  Laterality: Bilateral;   CYSTOSCOPY WITH URETEROSCOPY AND STENT PLACEMENT Left 02/12/2021   Procedure: CYSTOSCOPY WITH LEFT URETEROSCOPY, BALLOON DILITATION, HOLMIUM LASER LITHOTRIPSY AND STENT PLACEMENT;  Surgeon: Lucas Mallow, MD;  [facility];  Service: Urology;  Laterality:  Left;   HOLMIUM LASER APPLICATION Left 06/11/3845   Procedure: HOLMIUM LASER APPLICATION;  Surgeon: Alexis Frock, MD;  [facility];  Service: Urology;  Laterality: Left;   INTRAVASCULAR ULTRASOUND/IVUS N/A 12/28/2021   Procedure: Intravascular Ultrasound/IVUS;  Surgeon: Jettie Booze, MD;  Location: [facility];  Service: Cardiovascular;  Laterality: N/A;   LEFT HEART CATH AND CORONARY ANGIOGRAPHY N/A 12/28/2021   Procedure: LEFT HEART CATH AND CORONARY ANGIOGRAPHY;  Surgeon: Jettie Booze, MD;  Location: [facility];  Service: Cardiovascular;  Laterality: N/A;   LITHOTRIPSY     NEPHROLITHOTOMY Left 03/17/2014   Procedure: NEPHROLITHOTOMY PERCUTANEOUS with access;  Surgeon: Alexis Frock, MD;  [facility];  Service: Urology;  Laterality: Left;   NEPHROLITHOTOMY Left 03/19/2014   Procedure: 2ND STAGE NEPHROLITHOTOMY PERCUTANEOUS left ureteroscopy with lasr stone treatment and stent placement, with access;  Surgeon: Alexis Frock, MD;  [facility];  Service: Urology;  Laterality: Left;   TOTAL KNEE ARTHROPLASTY Right 09/02/2014   Procedure: RIGHT TOTAL KNEE ARTHROPLASTY;  Surgeon: Meredith Pel, MD;  Location: [facility];  Service: Orthopedics;  Laterality: Right;   uretral stent     Patient Active Problem List   Diagnosis Date Noted   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   Elevated CK 02/02/2022   Elevated LFTs 02/02/2022   CAD (coronary artery disease) 02/02/2022   Muscle weakness of all 4 extremities 02/02/2022   Hyperlipidemia 01/28/2022   NSTEMI (non-ST elevated myocardial  infarction) ([place]) 12/27/2021   Arthritis of knee 09/02/2014   Staghorn kidney stones 03/17/2014   Nephrolithiasis 02/01/2014   Pneumonia 08/27/2011   Type 2 diabetes mellitus ([place]) 08/27/2011   ONSET DATE: 12/27/21  REFERRING DIAG: M33.20 (ICD-10-CM) - Polymyositis, organ involvement unspecified   THERAPY DIAG:  Muscle weakness (generalized)  Other abnormalities of gait and  mobility  Repeated falls  Right shoulder pain, unspecified chronicity  Rationale for Evaluation and Treatment Rehabilitation  PERTINENT HISTORY: Suspected polymyositis; non-ST-elevation myocardial infarction (NSTEMI) s/p percutaneous coronary intervention (PCI) and drug-eluting stent (DES) placement 12/28/21; 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, chartered at the [employer]; travels for bands/festivals; yardwork  OBJECTIVE:   HAND DOMINANCE: Left  FUNCTIONAL OUTCOME MEASURES: Quick Dash: 59.1  - moderate functional impact Unable to do heavy household chores; wash back; perform recreational activities w/ force/impact through UE; work or perform other regular daily activities due to UE problem; UE problem has interfered quite a bit with normal social activities  UPPER EXTREMITY ROM     Active ROM Right Eval - 5/25 Left Eval - 5/25  Shoulder flexion 51 55  Shoulder abduction 50 58  Shoulder extension 40 47  Shoulder internal rotation 80 84  Shoulder external rotation 40 35  Elbow flexion 135 138  Elbow extension 41 6  (Blank rows = not tested)   TODAY'S TREATMENT: Closed-chain AAROM of shoulder flexion to chest height/90 degrees w/ hands clasped in supine position; completed 2 sets of 10 reps slowly  w/out increased pain and rest break required between sets   PATIENT EDUCATION: Provided condition-specific education for R rotator cuff injury, including precautions and positional, movement, and lifting considerations w/ pt verbalizing understanding. Also facilitated thorough discussion regarding fall prevention strategies (e.g., lighting, energy conservation, DME/AE, home environment considerations) and energy conservation techniques (e.g., planning, pacing, prioritizing, and positioning) w/ corresponding handouts provided at conclusion of session Person educated: Patient Education method: Explanation and Handouts Education comprehension: verbalized understanding   HOME EXERCISE PROGRAM: MedBridge Code: [id number] - Supine Shoulder Flexion with Dowel to 90  - 2 x daily - 2 sets - 10-15 reps   GOALS: Goals reviewed with patient? Yes  SHORT TERM GOALS: Target date: 03/31/22  STG  Status:  1 Pt will verbalize understanding of at least 3 fall prevention and/or energy conservation strategies to increase safety w/ functional mobility at home Baseline: Decreased knowledge of strategies On-going  2 Pt will demonstrate independence w/ recommended DME and/or AE (e.g., long-handle sponge, dressing stick, etc.) to improve participation and safety w/ ADLs Baseline: Decreased knowledge of AE On-going  3 Pt will improve functional use of BUEs as evidence by being able to achieve at least 70 degrees of shoulder flexion w/out pain Baseline: RUE: 51*, LUE: 55* On-going  4 Pt will improve functional bilateral reach by increasing R elbow extension to at least 25 degrees Baseline: 41* of flexion  when attempting extension On-going    LONG TERM GOALS: Target date: 05/12/22  LTG  Status:  1 Pt will demonstrate independence w/ full HEP designed for ROM and strength of BUEs Baseline: No HEP at this time On-going  2 Pt will improve QuickDASH score by at least 19 points to indicate mild functional  impairment Baseline: 59.1 (moderate, borderline severe) impairment) On-going  3 Pt will improve functional use of BUEs as evidence by improving strength of BUE shoulder flexion to at least 3+/5  Baseline: To be assessed On-going  4 With both hands, pt will be able to grip w/ at least 20 lbs of force to improve functional use of BUEs during IADLs Baseline: Unable to assess at eval due to medical restrictions On-going  5 Pt will be able to retrieve lightweight object (< 5 lbs) from overhead height w/out pain by d/c Baseline: Unable to reach overhead On-going    ASSESSMENT:  CLINICAL IMPRESSION: Pt returns for first treatment session following initial evaluation on 02/17/22. Since prior session, pt did seek additional medical care related to fall reported on 02/16/22 due to worsening bruising around L eye; this is visible today, but pt reports it is much improved and imaging revealed no further concerns. Pt also attended follow-up appt w/ Dr. Marlou [facility]) yesterday (02/23/22) regarding his R shoulder pain w/ no indication of "progression of rotator cuff tear which was partial near full-thickness tear several months ago," per MD note. This session, OT focused on education regarding both fall prevention and energy conservation considering significance of fall 1 day after d/c from hospital and notable generalized weakness bilaterally; pt verbalized understanding of all strategies. OT then introduced gentle AAROM of shoulder flexion in supine position for improved scapular alignment w/ retraction. Pt required Mod A to return to seated position from supine w/out UE support pt reports he uses w/ transition to home.  PERFORMANCE DEFICITS in functional skills including ADLs, IADLs, ROM, strength, pain, GMC, mobility, balance, body mechanics, endurance, cardiopulmonary status limiting function, decreased knowledge of precautions, decreased knowledge of use of DME, and UE functional use.   IMPAIRMENTS are  limiting patient from ADLs, IADLs, rest and sleep, work, leisure, and social participation.   COMORBIDITIES has co-morbidities such as NSTEMI, CAD, and DM  that affects occupational performance. Patient will benefit from skilled OT to address above impairments and improve overall function.   PLAN: OT FREQUENCY: 2x/week  OT DURATION: 10 weeks  PLANNED INTERVENTIONS: self care/ADL training, therapeutic exercise, therapeutic activity, manual therapy, passive range of motion, functional mobility training, aquatic therapy, moist heat, cryotherapy, patient/family education, energy conservation, DME and/or AE instructions, and Re-evaluation  RECOMMENDED OTHER SERVICES: Currently receiving PT services at this location  CONSULTED AND AGREED WITH PLAN OF CARE: Patient  PLAN FOR NEXT SESSION: Continue w/ RUE gentle ROM and strengthening as able   Kathrine Cords, MSOT, OTR/L 02/24/2022, 10:52 AM

## 2022-02-26 NOTE — Progress Notes (Signed)
Cardiology Office Note:    Date:  02/28/2022   ID:  MARICELA KAWAHARA, DOB 01-31-1956, MRN [id number]  PCP:  Greig Right, MD   [facility] Providers Cardiologist:  Werner Lean, MD     Referring MD: Greig Right, MD   CC: CAD f/u   History of Present Illness:    [name] is a 66 y.o. male with a hx of CAD s/p NSTEMI s/p DES [facility] 12/2021.  HTN with DM, HLD with DM, and transaminitis off statin and on Repatha.  Seen April 2023 in follow up with worsening dizziness and low BP.  Had ED visit 02/19/22 for fall and hitting head.  Patient notes that he is doing ok since being back home his ED visit.   Was able to do some ADLs.  Has been very cautious; uses a cane and a rollator.  Has lights in the hallway.  No further falls.  Has planned to not drive.  Sister is there for support. Appetite has improved. Is able to take a shower. Started physical therapy twice a week and getting stronger. Working at home.  Index angina was chest heaviness.  No chest pain or pressure or fatigue .  No SOB/DOE and no PND/Orthopnea.  Does have rare cough that is shallow.  No weight gain or leg swelling.  No palpitations.  Ambulatory blood pressure not done.   Past Medical History:  Diagnosis Date   Arthritis    Diabetes mellitus    fasting 120s   History of kidney stones    Hypertension    Peripheral neuropathy    left fifth finger, left toe   Pneumonia    hx of    Past Surgical History:  Procedure Laterality Date   APPENDECTOMY     CORONARY STENT INTERVENTION N/A 12/28/2021   Procedure: CORONARY STENT INTERVENTION;  Surgeon: Jettie Booze, MD;  Location: [place] [facility];  Service: Cardiovascular;  Laterality: N/A;   CYSTOSCOPY W/ URETERAL STENT PLACEMENT Bilateral 03/17/2014   Procedure: CYSTOSCOPY WITH RIGHT STENT PULL  ;  Surgeon: Alexis Frock, MD;  [facility];  Service: Urology;  Laterality: Bilateral;   CYSTOSCOPY WITH RETROGRADE PYELOGRAM, URETEROSCOPY AND  STENT PLACEMENT Bilateral 02/01/2014   Procedure: CYSTOSCOPY WITH BILATERAL RETROGRADE CYSTOSCOPY, BILATERAL RETROGRADE, PYELOGRAM, RIGHT URETEROSCOPY, STONE BASKETRY,  AND BILATERAL STENT PLACEMENT;  Surgeon: Alexis Frock, MD;  [facility];  Service: Urology;  Laterality: Bilateral;   CYSTOSCOPY WITH URETEROSCOPY AND STENT PLACEMENT Left 02/12/2021   Procedure: CYSTOSCOPY WITH LEFT URETEROSCOPY, BALLOON DILITATION, HOLMIUM LASER LITHOTRIPSY AND STENT PLACEMENT;  Surgeon: Lucas Mallow, MD;  [facility];  Service: Urology;  Laterality: Left;   HOLMIUM LASER APPLICATION Left 12/07/9700   Procedure: HOLMIUM LASER APPLICATION;  Surgeon: Alexis Frock, MD;  [facility];  Service: Urology;  Laterality: Left;   INTRAVASCULAR ULTRASOUND/IVUS N/A 12/28/2021   Procedure: Intravascular Ultrasound/IVUS;  Surgeon: Jettie Booze, MD;  Location: [place] [facility];  Service: Cardiovascular;  Laterality: N/A;   LEFT HEART CATH AND CORONARY ANGIOGRAPHY N/A 12/28/2021   Procedure: LEFT HEART CATH AND CORONARY ANGIOGRAPHY;  Surgeon: Jettie Booze, MD;  Location: [place] [facility];  Service: Cardiovascular;  Laterality: N/A;   LITHOTRIPSY     NEPHROLITHOTOMY Left 03/17/2014   Procedure: NEPHROLITHOTOMY PERCUTANEOUS with access;  Surgeon: Alexis Frock, MD;  [facility];  Service: Urology;  Laterality: Left;   NEPHROLITHOTOMY Left 03/19/2014   Procedure: 2ND STAGE NEPHROLITHOTOMY PERCUTANEOUS left ureteroscopy  with lasr stone treatment and stent placement, with access;  Surgeon: Alexis Frock, MD;  [facility];  Service: Urology;  Laterality: Left;   TOTAL KNEE ARTHROPLASTY Right 09/02/2014   Procedure: RIGHT TOTAL KNEE ARTHROPLASTY;  Surgeon: Meredith Pel, MD;  Location: [place];  Service: Orthopedics;  Laterality: Right;   uretral stent      Current Medications: Current Meds  Medication Sig   acetaminophen (TYLENOL) 325 MG tablet Take 1-2 tablets (325-650 mg total) by  mouth every 4 (four) hours as needed for mild pain.   amLODipine (NORVASC) 10 MG tablet Take 1 tablet (10 mg total) by mouth every morning.   aspirin 81 MG EC tablet Take 1 tablet (81 mg total) by mouth daily. Swallow whole.   carvedilol (COREG) 12.5 MG tablet Take 1 tablet (12.5 mg total) by mouth 2 (two) times daily with a meal.   cholecalciferol (VITAMIN D) 1000 units tablet Take 1 tablet (1,000 Units total) by mouth daily.   citalopram (CELEXA) 20 MG tablet Take 1 tablet (20 mg total) by mouth daily.   clopidogrel (PLAVIX) 75 MG tablet 1st dose take 300 mg then 75 mg daily   Cyanocobalamin (VITAMIN B-12) 2500 MCG SUBL Place 1 tablet (2,500 mcg total) under the tongue daily.   cyclobenzaprine (FLEXERIL) 10 MG tablet Take 1 tablet (10 mg total) by mouth at bedtime.   diclofenac Sodium (VOLTAREN) 1 % GEL Apply 2 g topically 4 (four) times daily.   Evolocumab (REPATHA SURECLICK) 222 MG/ML SOAJ Inject 1 pen. into the skin every 14 (fourteen) days.   insulin glargine (LANTUS) 100 UNIT/ML Solostar Pen Inject 12 Units into the skin 2 (two) times daily.   insulin lispro (HUMALOG) 100 UNIT/ML KwikPen Inject 4 Units into the skin daily at 6 (six) AM.   lisinopril (ZESTRIL) 40 MG tablet Take 1 tablet (40 mg total) by mouth every morning.   metFORMIN (GLUCOPHAGE) 500 MG tablet Take 2 tablets (1,000 mg total) by mouth 2 (two) times daily with a meal.   Multiple Vitamins-Minerals (MULTIVITAMINS THER. W/MINERALS) TABS Take 1 tablet by mouth daily.   nitroGLYCERIN (NITROSTAT) 0.4 MG SL tablet Place 1 tablet (0.4 mg total) under the tongue every 5 (five) minutes x 3 doses as needed for chest pain.   Omega-3 Fatty Acids (FISH OIL) 1200 MG CAPS Take 1,200 mg by mouth daily.   predniSONE (DELTASONE) 10 MG tablet Take 7 tablets (70 mg total) by mouth daily with breakfast for 6 days, THEN 6 tablets (60 mg total) daily with breakfast for 7 days, THEN 5 tablets (50 mg total) daily with breakfast for 7 days, THEN 4  tablets (40 mg total) daily with breakfast for 7 days, THEN 3 tablets (30 mg total) daily with breakfast for 7 days, THEN 2 tablets (20 mg total) daily with breakfast for 7 days, THEN 1 tablet (10 mg total) daily with breakfast.   [DISCONTINUED] ticagrelor (BRILINTA) 90 MG TABS tablet Take 1 tablet (90 mg total) by mouth 2 (two) times daily.     Allergies:   Atorvastatin calcium   Social History   Socioeconomic History   Marital status: Widowed    Spouse name: Not on file   Number of children: 2   Years of education: 3 yeae   Highest education level: Not on file  Occupational History   Not on file  Tobacco Use   Smoking status: Never   Smokeless tobacco: Never  Vaping Use   Vaping Use: Never used  Substance and Sexual Activity   Alcohol use: Yes    Alcohol/week: 2.0 standard drinks    Types: 2 Cans of beer per week    Comment: occasional/ 1-2 beers week   Drug use: No   Sexual activity: Yes    Birth control/protection: Other-see comments  Other Topics Concern   Not on file  Social History Narrative   Not on file   Social Determinants of Health   Financial Resource Strain: Not on file  Food Insecurity: Not on file  Transportation Needs: Not on file  Physical Activity: Not on file  Stress: Not on file  Social Connections: Not on file     Family History: The patient's family history includes Alzheimer's disease in his mother; Heart attack in his father.  ROS:   Please see the history of present illness.     All other systems reviewed and are negative.  EKGs/Labs/Other Studies Reviewed:    The following studies were reviewed today:  Transthoracic Echocardiogram: Date:02/04/22 Results:   1. Left ventricular ejection fraction, by estimation, is 55%. The  left ventricle has normal function. Left ventricular diastolic parameters  are consistent with Grade I diastolic dysfunction (impaired relaxation).   2. Right ventricular systolic function is normal. The right  ventricular  size is normal.   3. No evidence of mitral valve regurgitation.   4. Aortic valve regurgitation is not visualized. Aortic Sclerosis   5. The inferior vena cava is normal in size with greater than 50%  respiratory variability, suggesting right atrial pressure of 3 mmHg.   6. Agitated saline contrast bubble study was negative, with no evidence  of any interatrial shunt.   Left/Right Heart Catheterizations: Date: 12/28/21 Results:   Ost RCA lesion is 80% stenosed.   A drug-eluting stent was successfully placed using a STENT ONYX FRONTIER [id number], postdilated to 4.5 mm in diameter and optimized with intravascular ultrasound.   Post intervention, there is a 0% residual stenosis.   The left ventricular systolic function is normal.   LV end diastolic pressure is normal.   The left ventricular ejection fraction is 50-55% by visual estimate.   There is no aortic valve stenosis.   Successful PCI of the ostial RCA which was calcified.  It was a short focal area of calcification.  The distal reference was over 4 mm.  Proximal and ostial stent postdilated aggressively with a 4.5 [place] balloon.  I stressed the importance of dual antiplatelet therapy and aggressive secondary prevention.   Recent Labs: 12/27/2021: B Natriuretic Peptide 129.0 02/02/2022: TSH 1.232 02/07/2022: Magnesium 2.1 02/09/2022: ALT 246 02/14/2022: BUN 27; Creatinine, Ser 0.49; Hemoglobin 13.7; Platelets 292; Potassium 3.9; Sodium 139  Recent Lipid Panel    Component Value Date/Time   CHOL 272 (H) 12/28/2021 0259   TRIG 198 (H) 12/28/2021 0259   HDL 41 12/28/2021 0259   CHOLHDL 6.6 12/28/2021 0259   VLDL 40 12/28/2021 0259   LDLCALC 191 (H) 12/28/2021 0259   LDLDIRECT 97.8 02/03/2022 2235         Physical Exam:    VS:  BP (!) 150/82   Pulse 78   Ht '5\' 10"'$  (1.778 m)   Wt 182 lb (82.6 kg)   SpO2 98%   BMI 26.11 kg/m     Wt Readings from Last 3 Encounters:  02/28/22 182 lb (82.6 kg)  02/19/22 179 lb 14.3 oz  (81.6 kg)  02/14/22 179 lb 14.3 oz (81.6 kg)    Orthostatic Vitals: Supine:  BP 137/76  HR 72 Sitting:  BP 128/77  HR 76 Standing:  BP 125/76  HR 80 Prolonged Stand:  BP 129/71  HR 54  Gen: no distress    Neck: No JVD Cardiac: No Rubs or Gallops, no murmur, RRR +2 radial pulses Respiratory: Clear to auscultation bilaterally, normal effort, normal  respiratory rate GI: Soft, nontender, non-distended  MS: No  edema;  moves all extremities Integument: Skin feels warm Neuro:  At time of evaluation, alert and oriented to person/place/time/situation  Psych: Normal affect, patient feels well   ASSESSMENT:    1. Hyperlipidemia, unspecified hyperlipidemia type   2. Coronary artery disease involving native coronary artery of native heart without angina pectoris    PLAN:    Coronary Artery Disease; Obstructive/Nonobstructive HLD with DM Statin Myalgia - asymptomatic  with stable angina on therapy  - anatomy: oRCA - continue ASA 81 mg - we will stop Ticagrelor and transition to plavix (start plavix 300 mg X1 and then plavix 75 mg PO daily the next day) - will send copy of our note to patient's neurologist once he knows who that is; planned for July muscle biopsy for weakness (can hold anti-platelets as needed) - continue PCSK9i, goal LDL < 55; lipids, ALT, and Lp(a) in three months - continue BB (coreg 12.5 mg BID) - continue PRN  nitrates; (None needed)  - continue ACEi (40 mg ) - discussed cardiac rehab  Hypertension with Diabetes Mild AS - ambulatory blood pressure not done, will start ambulatory BP monitoring; gave education on how to perform ambulatory blood pressure monitoring including the frequency and technique; goal ambulatory blood pressure < 130/80 on average - continue home medications: he has some orthostatics but give labile BP and no sx during orthostatics, this may be the best compromise on medication regimen - echo in 3 years at minimum for AS/aortic  sclerosis  Fall f/u me or APP       Medication Adjustments/Labs and Tests Ordered: Current medicines are reviewed at length with the patient today.  Concerns regarding medicines are outlined above.  Orders Placed This Encounter  Procedures   ALT   Lipid panel   Lipoprotein A (LPA)   Meds ordered this encounter  Medications   clopidogrel (PLAVIX) 75 MG tablet    Sig: 1st dose take 300 mg then 75 mg daily    Dispense:  94 tablet    Refill:  3    Patient Instructions  Medication Instructions:  Your physician has recommended you make the following change in your medication:  STOP: Brilinta START: clopidogrel (Plavix)   1st dose take 300 mg by mouth (4 tablets)  THEN take 75 mg by mouth daily  *If you need a refill on your cardiac medications before your next appointment, please call your pharmacy*   Lab Work: IN 3 MONTHS:ALT, Lipo A,  Fasting lipid panel (nothing to eat or drink except water and black coffee 12 hours prior)   If you have labs (blood work) drawn today and your tests are completely normal, you will receive your results only by: [place] (if you have MyChart) OR A paper copy in the mail If you have any lab test that is abnormal or we need to change your treatment, we will call you to review the results.   Testing/Procedures: NONE   Follow-Up: At [facility], you and your health needs are our priority.  As part of our continuing mission to provide you with exceptional heart care, we have created designated Provider  Care Teams.  These Care Teams include your primary Cardiologist (physician) and Advanced Practice Providers (APPs -  Physician Assistants and Nurse Practitioners) who all work together to provide you with the care you need, when you need it.  Your next appointment:   4-5 month(s)  The format for your next appointment:   In Person  Provider:   Werner Lean, MD     Other Instructions Please send in a my chart message  with the name of your neurologist.   Important Information About Sugar         Signed, Werner Lean, MD  02/28/2022 9:54 AM    [place]

## 2022-02-28 ENCOUNTER — Encounter: Payer: Self-pay | Admitting: Internal Medicine

## 2022-02-28 ENCOUNTER — Ambulatory Visit: Payer: Medicare Other | Admitting: Internal Medicine

## 2022-02-28 VITALS — BP 150/82 | HR 78 | Ht 70.0 in | Wt 182.0 lb

## 2022-02-28 DIAGNOSIS — M791 Myalgia, unspecified site: Secondary | ICD-10-CM | POA: Diagnosis not present

## 2022-02-28 DIAGNOSIS — I350 Nonrheumatic aortic (valve) stenosis: Secondary | ICD-10-CM

## 2022-02-28 DIAGNOSIS — I2510 Atherosclerotic heart disease of native coronary artery without angina pectoris: Secondary | ICD-10-CM

## 2022-02-28 DIAGNOSIS — T466X5A Adverse effect of antihyperlipidemic and antiarteriosclerotic drugs, initial encounter: Secondary | ICD-10-CM | POA: Insufficient documentation

## 2022-02-28 DIAGNOSIS — E785 Hyperlipidemia, unspecified: Secondary | ICD-10-CM

## 2022-02-28 DIAGNOSIS — E119 Type 2 diabetes mellitus without complications: Secondary | ICD-10-CM

## 2022-02-28 DIAGNOSIS — I10 Essential (primary) hypertension: Secondary | ICD-10-CM

## 2022-02-28 MED ORDER — CLOPIDOGREL BISULFATE 75 MG PO TABS: ORAL_TABLET | ORAL | 3 refills | Status: DC

## 2022-02-28 NOTE — Patient Instructions (Signed)
Medication Instructions:  Your physician has recommended you make the following change in your medication:  STOP: Brilinta START: clopidogrel (Plavix)   1st dose take 300 mg by mouth (4 tablets)  THEN take 75 mg by mouth daily  *If you need a refill on your cardiac medications before your next appointment, please call your pharmacy*   Lab Work: IN 3 MONTHS:ALT, Lipo A,  Fasting lipid panel (nothing to eat or drink except water and black coffee 12 hours prior)   If you have labs (blood work) drawn today and your tests are completely normal, you will receive your results only by: [place] (if you have MyChart) OR A paper copy in the mail If you have any lab test that is abnormal or we need to change your treatment, we will call you to review the results.   Testing/Procedures: NONE   Follow-Up: At [facility], you and your health needs are our priority.  As part of our continuing mission to provide you with exceptional heart care, we have created designated Provider Care Teams.  These Care Teams include your primary Cardiologist (physician) and Advanced Practice Providers (APPs -  Physician Assistants and Nurse Practitioners) who all work together to provide you with the care you need, when you need it.  Your next appointment:   4-5 month(s)  The format for your next appointment:   In Person  Provider:   Werner Lean, MD     Other Instructions Please send in a my chart message with the name of your neurologist.   Important Information About Sugar

## 2022-02-28 NOTE — Therapy (Signed)
OUTPATIENT PHYSICAL THERAPY LOWER EXTREMITY TREATMENT   Patient Name: [name] MRN: [id number] DOB:May 11, 1956, 66 y.o., male Today's Date: 03/01/2022   PT End of Session - 03/01/22 0918     Visit Number 3    Progress Note Due on Visit 10    PT Start Time 0915    PT Stop Time 0957    PT Time Calculation (min) 42 min    Equipment Utilized During Treatment Gait belt    Activity Tolerance Patient tolerated treatment well;Patient limited by fatigue    Behavior During Therapy WFL for tasks assessed/performed               Past Medical History:  Diagnosis Date   Arthritis    Diabetes mellitus    fasting 120s   History of kidney stones    Hypertension    Peripheral neuropathy    left fifth finger, left toe   Pneumonia    hx of   Past Surgical History:  Procedure Laterality Date   APPENDECTOMY     CORONARY STENT INTERVENTION N/A 12/28/2021   Procedure: CORONARY STENT INTERVENTION;  Surgeon: Jettie Booze, MD;  Location: [place] [facility];  Service: Cardiovascular;  Laterality: N/A;   CYSTOSCOPY W/ URETERAL STENT PLACEMENT Bilateral 03/17/2014   Procedure: CYSTOSCOPY WITH RIGHT STENT PULL  ;  Surgeon: Alexis Frock, MD;  [facility];  Service: Urology;  Laterality: Bilateral;   CYSTOSCOPY WITH RETROGRADE PYELOGRAM, URETEROSCOPY AND STENT PLACEMENT Bilateral 02/01/2014   Procedure: CYSTOSCOPY WITH BILATERAL RETROGRADE CYSTOSCOPY, BILATERAL RETROGRADE, PYELOGRAM, RIGHT URETEROSCOPY, STONE BASKETRY,  AND BILATERAL STENT PLACEMENT;  Surgeon: Alexis Frock, MD;  [facility];  Service: Urology;  Laterality: Bilateral;   CYSTOSCOPY WITH URETEROSCOPY AND STENT PLACEMENT Left 02/12/2021   Procedure: CYSTOSCOPY WITH LEFT URETEROSCOPY, BALLOON DILITATION, HOLMIUM LASER LITHOTRIPSY AND STENT PLACEMENT;  Surgeon: Lucas Mallow, MD;  [facility];  Service: Urology;  Laterality: Left;   HOLMIUM LASER APPLICATION Left 01/02/8118   Procedure: HOLMIUM LASER  APPLICATION;  Surgeon: Alexis Frock, MD;  [facility];  Service: Urology;  Laterality: Left;   INTRAVASCULAR ULTRASOUND/IVUS N/A 12/28/2021   Procedure: Intravascular Ultrasound/IVUS;  Surgeon: Jettie Booze, MD;  Location: [place] [facility];  Service: Cardiovascular;  Laterality: N/A;   LEFT HEART CATH AND CORONARY ANGIOGRAPHY N/A 12/28/2021   Procedure: LEFT HEART CATH AND CORONARY ANGIOGRAPHY;  Surgeon: Jettie Booze, MD;  Location: [place] [facility];  Service: Cardiovascular;  Laterality: N/A;   LITHOTRIPSY     NEPHROLITHOTOMY Left 03/17/2014   Procedure: NEPHROLITHOTOMY PERCUTANEOUS with access;  Surgeon: Alexis Frock, MD;  [facility];  Service: Urology;  Laterality: Left;   NEPHROLITHOTOMY Left 03/19/2014   Procedure: 2ND STAGE NEPHROLITHOTOMY PERCUTANEOUS left ureteroscopy with lasr stone treatment and stent placement, with access;  Surgeon: Alexis Frock, MD;  [facility];  Service: Urology;  Laterality: Left;   TOTAL KNEE ARTHROPLASTY Right 09/02/2014   Procedure: RIGHT TOTAL KNEE ARTHROPLASTY;  Surgeon: Meredith Pel, MD;  Location: [place];  Service: Orthopedics;  Laterality: Right;   uretral stent     Patient Active Problem List   Diagnosis Date Noted   Myalgia due to statin 02/28/2022   Mild aortic stenosis 02/28/2022   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   Elevated CK 02/02/2022   Elevated LFTs 02/02/2022   CAD (coronary artery disease) 02/02/2022   Muscle weakness of all 4 extremities 02/02/2022   Hyperlipidemia 01/28/2022   Arthritis of knee 09/02/2014  [place] kidney stones 03/17/2014   Nephrolithiasis 02/01/2014   Pneumonia 08/27/2011   Diabetes mellitus with coincident hypertension ([place]) 08/27/2011    PCP: Greig Right, MD  REFERRING PROVIDER: Cathlyn Parsons, PA-C   REFERRING DIAG: M33.20 (ICD-10-CM) - Polymyositis, organ involvement unspecified   THERAPY DIAG:  Muscle weakness (generalized)  Other abnormalities of gait and  mobility  Repeated falls  Polymyositis ([place])  Other lack of coordination  Unsteadiness on feet  Rationale for Evaluation and Treatment Rehabilitation  ONSET DATE: 02/15/2022   SUBJECTIVE:   SUBJECTIVE STATEMENT:   Patient is super cautious since his fall and has put up lights in his hallways. He reports no pain and no falls since last visit. He is walking ~2000 steps a day with quad cane.   PERTINENT HISTORY: ALDRIN ENGELHARD is a 66 y.o. right-handed male with history of type 2 diabetes mellitus hypertension intolerance of statin due to abnormal LFTs (off for the past years), CAD status post DES to RCA 12/28/2021 (addition of arterial for statin 20 mg, aspirin/Brilinta),   PAIN:  Are you having pain? No  PRECAUTIONS: None  WEIGHT BEARING RESTRICTIONS No  FALLS:  Has patient fallen in last 6 months? Yes. Number of falls 5  LIVING ENVIRONMENT: Lives with: lives with their family and lives alone Lives in: House/apartment Stairs: No Has following equipment at home: [place] - 4 wheeled, shower chair, Grab bars, and None  OCCUPATION: Comptroller, office work, Government social research officer.  PLOF: Independent  PATIENT GOALS Increase strength, return to his active lifestyle. 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 Travels to attend concerts.   OBJECTIVE:   MUSCLE LENGTH: Hamstrings: Right 62 deg; Left 68 deg   POSTURE: rounded shoulders   LOWER EXTREMITY ROM: WFL throughout, R hip with mild     tightness.   LOWER EXTREMITY MMT:  MMT Right eval Left eval  Hip flexion 4- 3+  Hip extension 4- 3+  Hip abduction 4- 3+  Hip adduction    Hip internal rotation    Hip external rotation    Knee flexion 4 4-  Knee extension 4 4-  Ankle dorsiflexion 4 4  Ankle plantarflexion    Ankle inversion    Ankle eversion     (Blank rows = not tested)   FUNCTIONAL TESTS:    5x STS18.22 BERG 43/56   TODAY'S TREATMENT: 03/01/22 Nustep L5, 82mns STS  x10 pushing off handles, x7  Supine SLR Bridges 2x10 3s holds Hamstring stretch 30s Cone taps 20reps with 2 finger support Walking w/quad cane 2 laps    02/24/22 STS 2x10 (requires push off from thighs) Walking with quad cane 2 laps w/CGA  2 way hip 2x10 Step ups on foam 2x10, 2 finger support, CGA Calf raises 2x10   PATIENT EDUCATION:  Education details: POC Person educated: Patient Education method: Explanation, TCorporate treasurercues, and Verbal cues Education comprehension: verbalized understanding, returned demonstration, verbal cues required, and tactile cues required    Patent educated about keeping lights on during the night for safety and to prevent future falls. Also educated about collection of HEP, and to not do all exercises but rather pick a few to do in order to avoid fatigue.  HOME EXERCISE PROGRAM: [id number] ASSESSMENT:  CLINICAL IMPRESSION: Patient continues to show signs of muscle weakness and fatigue with repetition of exercises, needing additional rest breaks to catch his breath. Today focused on LE strengthening and balance, he is unable to complete cone taps without holding  on for support. He was unable to complete full sets of STS d/t fatigue and decreased strength to push up.  Patient is able to walk 2 laps with quad cane with CGA, due to increased mild gait deviations especially with changes in direction.    OBJECTIVE IMPAIRMENTS Abnormal gait, decreased activity tolerance, decreased balance, decreased coordination, decreased endurance, decreased mobility, difficulty walking, decreased strength, improper body mechanics, and postural dysfunction.   ACTIVITY LIMITATIONS carrying, lifting, bending, standing, squatting, stairs, transfers, bed mobility, and locomotion level  PARTICIPATION LIMITATIONS: meal prep, cleaning, laundry, driving, shopping, community activity, occupation, and yard work  PERSONAL FACTORS Past/current experiences are also affecting patient's  functional outcome.   REHAB POTENTIAL: Good  CLINICAL DECISION MAKING: Evolving/moderate complexity  EVALUATION COMPLEXITY: Moderate   GOALS: Goals reviewed with patient? Yes  SHORT TERM GOALS: Target date: 03/17/2022   I with basic HEP Baseline: Goal status: INITIAL    LONG TERM GOALS: Target date: 05/12/2022   I with final HEP Baseline:  Goal status: INITIAL  2.  Patient will score at least 50/56 on BERG Balance Exam Baseline: 43 Goal status: INITIAL  3.  Paitent will complete 5x sit to stand in < 12 sec to demonstrate improved strength in BLE. Baseline:  Goal status: INITIAL  4.  Patient will ambulate at least 1000' with LRAD, MI, on level and unlevel surfaces Baseline:  Goal status: INITIAL  5.  Patient will be able to manage cooking and cleaning activities in his home without falling and without requiring a rest break. Baseline:  Goal status: INITIAL   PLAN: PT FREQUENCY: 2x/week  PT DURATION: 12 weeks  PLANNED INTERVENTIONS: Therapeutic exercises, Therapeutic activity, Neuromuscular re-education, Balance training, Gait training, Patient/Family education, Joint mobilization, Stair training, and Manual therapy  PLAN FOR NEXT SESSION: gait and balance activities, LE strengthening   Andris Baumann, DPT 03/01/2022, 10:03 AM

## 2022-03-01 ENCOUNTER — Encounter: Payer: Self-pay | Admitting: Occupational Therapy

## 2022-03-01 ENCOUNTER — Ambulatory Visit: Payer: Medicare Other

## 2022-03-01 ENCOUNTER — Ambulatory Visit: Payer: Medicare Other | Admitting: Occupational Therapy

## 2022-03-01 DIAGNOSIS — R262 Difficulty in walking, not elsewhere classified: Secondary | ICD-10-CM | POA: Diagnosis not present

## 2022-03-01 DIAGNOSIS — M25512 Pain in left shoulder: Secondary | ICD-10-CM | POA: Diagnosis not present

## 2022-03-01 DIAGNOSIS — R2681 Unsteadiness on feet: Secondary | ICD-10-CM

## 2022-03-01 DIAGNOSIS — M3320 Polymyositis, organ involvement unspecified: Secondary | ICD-10-CM

## 2022-03-01 DIAGNOSIS — R2689 Other abnormalities of gait and mobility: Secondary | ICD-10-CM

## 2022-03-01 DIAGNOSIS — M6281 Muscle weakness (generalized): Secondary | ICD-10-CM

## 2022-03-01 DIAGNOSIS — R296 Repeated falls: Secondary | ICD-10-CM

## 2022-03-01 DIAGNOSIS — M25511 Pain in right shoulder: Secondary | ICD-10-CM | POA: Diagnosis not present

## 2022-03-01 DIAGNOSIS — R278 Other lack of coordination: Secondary | ICD-10-CM | POA: Diagnosis not present

## 2022-03-01 NOTE — Therapy (Unsigned)
OUTPATIENT OCCUPATIONAL THERAPY TREATMENT NOTE   Patient Name: [name] MRN: [id number] DOB:21-Nov-1955, 66 y.o., male Today's Date: 03/01/2022  PCP: Greig Right, MD REFERRING PROVIDER: Cathlyn Parsons, PA-C   END OF SESSION:   OT End of Session - 03/01/22 1013     Visit Number 3    Date for OT Re-Evaluation 05/12/22    Authorization Type BCBS Medicare    Authorization Time Period VL: [place]    OT Start Time 1012    OT Stop Time 1052    OT Time Calculation (min) 40 min    Activity Tolerance Patient tolerated treatment well    Behavior During Therapy WFL for tasks assessed/performed            Past Medical History:  Diagnosis Date   Arthritis    Diabetes mellitus    fasting 120s   History of kidney stones    Hypertension    Peripheral neuropathy    left fifth finger, left toe   Pneumonia    hx of   Past Surgical History:  Procedure Laterality Date   APPENDECTOMY     CORONARY STENT INTERVENTION N/A 12/28/2021   Procedure: CORONARY STENT INTERVENTION;  Surgeon: Jettie Booze, MD;  Location: [facility];  Service: Cardiovascular;  Laterality: N/A;   CYSTOSCOPY W/ URETERAL STENT PLACEMENT Bilateral 03/17/2014   Procedure: CYSTOSCOPY WITH RIGHT STENT PULL  ;  Surgeon: Alexis Frock, MD;  Location: [facility];  Service: Urology;  Laterality: Bilateral;   CYSTOSCOPY WITH RETROGRADE PYELOGRAM, URETEROSCOPY AND STENT PLACEMENT Bilateral 02/01/2014   Procedure: CYSTOSCOPY WITH BILATERAL RETROGRADE CYSTOSCOPY, BILATERAL RETROGRADE, PYELOGRAM, RIGHT URETEROSCOPY, STONE BASKETRY,  AND BILATERAL STENT PLACEMENT;  Surgeon: Alexis Frock, MD;  Location: [facility];  Service: Urology;  Laterality: Bilateral;   CYSTOSCOPY WITH URETEROSCOPY AND STENT PLACEMENT Left 02/12/2021   Procedure: CYSTOSCOPY WITH LEFT URETEROSCOPY, BALLOON DILITATION, HOLMIUM LASER LITHOTRIPSY AND STENT PLACEMENT;  Surgeon: Lucas Mallow, MD;  Location: [facility];  Service: Urology;  Laterality:  Left;   HOLMIUM LASER APPLICATION Left 5/78/4696   Procedure: HOLMIUM LASER APPLICATION;  Surgeon: Alexis Frock, MD;  Location: [facility];  Service: Urology;  Laterality: Left;   INTRAVASCULAR ULTRASOUND/IVUS N/A 12/28/2021   Procedure: Intravascular Ultrasound/IVUS;  Surgeon: Jettie Booze, MD;  Location: [facility];  Service: Cardiovascular;  Laterality: N/A;   LEFT HEART CATH AND CORONARY ANGIOGRAPHY N/A 12/28/2021   Procedure: LEFT HEART CATH AND CORONARY ANGIOGRAPHY;  Surgeon: Jettie Booze, MD;  Location: [facility];  Service: Cardiovascular;  Laterality: N/A;   LITHOTRIPSY     NEPHROLITHOTOMY Left 03/17/2014   Procedure: NEPHROLITHOTOMY PERCUTANEOUS with access;  Surgeon: Alexis Frock, MD;  [facility];  Service: Urology;  Laterality: Left;   NEPHROLITHOTOMY Left 03/19/2014   Procedure: 2ND STAGE NEPHROLITHOTOMY PERCUTANEOUS left ureteroscopy with lasr stone treatment and stent placement, with access;  Surgeon: Alexis Frock, MD;  Location[facility];  Service: Urology;  Laterality: Left;   TOTAL KNEE ARTHROPLASTY Right 09/02/2014   Procedure: RIGHT TOTAL KNEE ARTHROPLASTY;  Surgeon: Meredith Pel, MD;  Location: [facility];  Service: Orthopedics;  Laterality: Right;   uretral stent     Patient Active Problem List   Diagnosis Date Noted   Myalgia due to statin 02/28/2022   Mild aortic stenosis 02/28/2022   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   Elevated CK 02/02/2022   Elevated LFTs 02/02/2022   CAD (coronary artery disease) 02/02/2022   Muscle weakness of all  4 extremities 02/02/2022   Hyperlipidemia 01/28/2022   Arthritis of knee 09/02/2014   Staghorn kidney stones 03/17/2014   Nephrolithiasis 02/01/2014   Pneumonia 08/27/2011   Diabetes mellitus with coincident hypertension ([place]) 08/27/2011   ONSET DATE: 12/27/21  REFERRING DIAG: M33.20 (ICD-10-CM) - Polymyositis, organ involvement unspecified   THERAPY DIAG:  Muscle weakness (generalized)  Repeated  falls  Other abnormalities of gait and mobility  Right shoulder pain, unspecified chronicity  Left shoulder pain, unspecified chronicity  Rationale for Evaluation and Treatment Rehabilitation  PERTINENT HISTORY: Suspected polymyositis; non-ST-elevation myocardial infarction (NSTEMI) s/p percutaneous coronary intervention (PCI) and drug-eluting stent (DES) placement 12/28/21; 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, chartered at the [employer]; travels for bands/festivals; yardwork  OBJECTIVE:   HAND DOMINANCE: Left  FUNCTIONAL OUTCOME MEASURES: Quick Dash: 59.1  - moderate functional impact Unable to do heavy household chores; wash back; perform recreational activities w/ force/impact through UE; work or perform other regular daily activities due to UE problem; UE problem has interfered quite a bit with normal social activities  UPPER EXTREMITY ROM     Active ROM Right Eval - 5/25 Left Eval - 5/25  Shoulder flexion 51 55  Shoulder abduction 50 58  Shoulder extension 40 47  Shoulder internal rotation 80 84  Shoulder external rotation 40 35  Elbow flexion 135 138  Elbow extension 41 6  (Blank rows = not tested)   TODAY'S TREATMENT: Closed-chain shoulder flexion w/  1# dowel while seated 4x10 w/ rest breaks between sets; OT provided verbal cues to decrease compensatory movement patterns/facilitate elbow extension; able to achieve approx 45 degrees of flexion Single-arm shoulder ER w/ 1 lb dumbbell and towel roll positioned under UE; alternating sides completing 3x10 each side w/out difficulty BUE rows w/ anchored theraband (red), focusing on scapular retraction; completed 2x20 while seated EOM w/out difficulty or increased pain Trunk flexion holding 1 kg heavymed ball and slightly reaching forward to tap target; completed 3x10 w/ rest breaks between sets. OT also demonstrated core strengthening exercise holding on to anchored theraband and flexing trunk forward against resistance; pt did not return demonstration Vasopneumatic modality to L shoulder for 10 min, low pressure, 50 degrees at conclusion of session; no adverse reaction   PATIENT EDUCATION: Reviewed condition-specific education for R rotator cuff injury, including precautions and positional, movement, and lifting considerations Person educated: Patient Education method: Explanation and Handouts Education comprehension: verbalized understanding   HOME EXERCISE PROGRAM: MedBridge Code: [id number] - Supine Shoulder Flexion with Dowel to 90  - 2 x daily - 2 sets - 10-15 reps   GOALS: Goals reviewed with patient? Yes  SHORT TERM GOALS: Target date: 03/31/22  STG  Status:  1 Pt will verbalize understanding of at least 3 fall prevention and/or energy conservation strategies to increase safety w/ functional mobility at home Baseline: Decreased knowledge of strategies  On-going  2 Pt will demonstrate independence w/ recommended DME and/or AE (e.g., long-handle sponge, dressing stick, etc.) to improve participation and safety w/ ADLs Baseline: Decreased knowledge of AE On-going  3 Pt will improve functional use of BUEs as evidence by being able to achieve at least 70 degrees of shoulder flexion w/out  pain Baseline: RUE: 51*, LUE: 55* On-going  4 Pt will improve functional bilateral reach by increasing R elbow extension to at least 25 degrees Baseline: 41* of flexion when attempting extension On-going    LONG TERM GOALS: Target date: 05/12/22  LTG  Status:  1 Pt will demonstrate independence w/ full HEP designed for ROM and strength of BUEs Baseline: No HEP at this time On-going  2 Pt will improve QuickDASH score by at least 19 points to indicate mild functional impairment Baseline: 59.1 (moderate, borderline severe) impairment) On-going  3 Pt will improve functional use of BUEs as evidence by improving strength of BUE shoulder flexion to at least 3+/5  Baseline: To be assessed On-going  4 With both hands, pt will be able to grip w/ at least 20 lbs of force to improve functional use of BUEs during IADLs Baseline: Unable to assess at eval due to medical restrictions On-going  5 Pt will be able to retrieve lightweight object (< 5 lbs) from overhead height w/out pain by d/c Baseline: Unable to reach overhead On-going    ASSESSMENT:  CLINICAL IMPRESSION: OT focused session on light UE strengthening, endurance, and core activation for improved functional use of BUEs and safety and ease of functional transfers and transitions. Pt continues to demonstrate limitations w/ shoulder forward flexion, and may benefit from completing exercises in gravity minimized or assisted positions. Due to pt's report of feeling decreased core strength and stability, OT introduced exercises pt is able to complete w/ resistance band at home; pt verbalized understanding. At conclusion of session, OT incorporated vasopneumatic modality to prevent inflammation and pain after completing shoulder exercises. Continue w/ POC.  PERFORMANCE DEFICITS in functional skills including ADLs, IADLs, ROM, strength, pain, GMC, mobility, balance, body mechanics, endurance, cardiopulmonary status limiting function, decreased knowledge  of precautions, decreased knowledge of use of DME, and UE functional use.   IMPAIRMENTS are limiting patient from ADLs, IADLs, rest and sleep, work, leisure, and social participation.   COMORBIDITIES has co-morbidities such as NSTEMI, CAD, and DM  that affects occupational performance. Patient will benefit from skilled OT to address above impairments and improve overall function.   PLAN: OT FREQUENCY: 2x/week  OT DURATION: 10 weeks  PLANNED INTERVENTIONS: self care/ADL training, therapeutic exercise, therapeutic activity, manual therapy, passive range of motion, functional mobility training, aquatic therapy, moist heat, cryotherapy, patient/family education, energy conservation, DME and/or AE instructions, and Re-evaluation  RECOMMENDED OTHER SERVICES: Currently receiving PT services at this location  CONSULTED AND AGREED WITH PLAN OF CARE: Patient  PLAN FOR NEXT SESSION: Continue w/ RUE gentle ROM and strengthening as able   Kathrine Cords, MSOT, OTR/L 03/01/2022, 2:35 PM

## 2022-03-02 LAB — MISC LABCORP TEST (SEND OUT): Labcorp test code: 9985

## 2022-03-02 NOTE — Therapy (Signed)
OUTPATIENT PHYSICAL THERAPY LOWER EXTREMITY TREATMENT   Patient Name: [name] MRN: [id number] DOB:[birth year], 66 y.o., male Today's Date: 03/03/2022   PT End of Session - 03/03/22 0924     Visit Number 4    Progress Note Due on Visit 10    PT Start Time 0925    PT Stop Time 1010    PT Time Calculation (min) 45 min    Activity Tolerance Patient tolerated treatment well;Patient limited by fatigue    Behavior During Therapy WFL for tasks assessed/performed                Past Medical History:  Diagnosis Date   Arthritis    Diabetes mellitus    fasting 120s   History of kidney stones    Hypertension    Peripheral neuropathy    left fifth finger, left toe   Pneumonia    hx of   Past Surgical History:  Procedure Laterality Date   APPENDECTOMY     CORONARY STENT INTERVENTION N/A 12/28/2021   Procedure: CORONARY STENT INTERVENTION;  Surgeon: Jettie Booze, MD;  Location: [facility];  Service: Cardiovascular;  Laterality: N/A;   CYSTOSCOPY W/ URETERAL STENT PLACEMENT Bilateral 03/17/2014   Procedure: CYSTOSCOPY WITH RIGHT STENT PULL  ;  Surgeon: Alexis Frock, MD;  [facility];  Service: Urology;  Laterality: Bilateral;   CYSTOSCOPY WITH RETROGRADE PYELOGRAM, URETEROSCOPY AND STENT PLACEMENT Bilateral 02/01/2014   Procedure: CYSTOSCOPY WITH BILATERAL RETROGRADE CYSTOSCOPY, BILATERAL RETROGRADE, PYELOGRAM, RIGHT URETEROSCOPY, STONE BASKETRY,  AND BILATERAL STENT PLACEMENT;  Surgeon: Alexis Frock, MD;  [facility];  Service: Urology;  Laterality: Bilateral;   CYSTOSCOPY WITH URETEROSCOPY AND STENT PLACEMENT Left 02/12/2021   Procedure: CYSTOSCOPY WITH LEFT URETEROSCOPY, BALLOON DILITATION, HOLMIUM LASER LITHOTRIPSY AND STENT PLACEMENT;  Surgeon: Lucas Mallow, MD;  [facility];  Service: Urology;  Laterality: Left;   HOLMIUM LASER APPLICATION Left 6/96/2952   Procedure: HOLMIUM LASER APPLICATION;  Surgeon: Alexis Frock, MD;  [facility];  Service: Urology;  Laterality: Left;   INTRAVASCULAR ULTRASOUND/IVUS N/A 12/28/2021   Procedure: Intravascular Ultrasound/IVUS;  Surgeon: Jettie Booze, MD;  Location: [facility];  Service: Cardiovascular;  Laterality: N/A;   LEFT HEART CATH AND CORONARY ANGIOGRAPHY N/A 12/28/2021   Procedure: LEFT HEART CATH AND CORONARY ANGIOGRAPHY;  Surgeon: Jettie Booze, MD;  Location: [facility];  Service: Cardiovascular;  Laterality: N/A;   LITHOTRIPSY     NEPHROLITHOTOMY Left 03/17/2014   Procedure: NEPHROLITHOTOMY PERCUTANEOUS with access;  Surgeon: Alexis Frock, MD;  [facility];  Service: Urology;  Laterality: Left;   NEPHROLITHOTOMY Left 03/19/2014   Procedure: 2ND STAGE NEPHROLITHOTOMY PERCUTANEOUS left ureteroscopy with lasr stone treatment and stent placement, with access;  Surgeon: Alexis Frock, MD;  [facility];  Service: Urology;  Laterality: Left;   TOTAL KNEE ARTHROPLASTY Right 09/02/2014   Procedure: RIGHT TOTAL KNEE ARTHROPLASTY;  Surgeon: Meredith Pel, MD;  Location: [facility];  Service: Orthopedics;  Laterality: Right;   uretral stent     Patient Active Problem List   Diagnosis Date Noted   Myalgia due to statin 02/28/2022   Mild aortic stenosis 02/28/2022   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   Elevated CK 02/02/2022   Elevated LFTs 02/02/2022   CAD (coronary artery disease) 02/02/2022   Muscle weakness of all 4 extremities 02/02/2022   Hyperlipidemia 01/28/2022   Arthritis of knee 09/02/2014   Staghorn kidney stones 03/17/2014   Nephrolithiasis  02/01/2014   Pneumonia 08/27/2011   Diabetes mellitus with coincident hypertension ([place]) 08/27/2011    PCP: Greig Right, MD  REFERRING PROVIDER: Cathlyn Parsons, PA-C   REFERRING DIAG: M33.20 (ICD-10-CM) - Polymyositis, organ involvement unspecified   THERAPY DIAG:  Muscle weakness (generalized)  Repeated falls  Other abnormalities of gait and mobility  Polymyositis  ([place])  Unsteadiness on feet  Rationale for Evaluation and Treatment Rehabilitation  ONSET DATE: 02/15/2022   SUBJECTIVE:   SUBJECTIVE STATEMENT:   Patient reports he was feeling fatigued yesterday but is feeling good today. Is using a cane today instead of walker, and reports no pain.    PERTINENT HISTORY: [name] is a 66 y.o. right-handed male with history of type 2 diabetes mellitus hypertension intolerance of statin due to abnormal LFTs (off for the past years), CAD status post DES to RCA 12/28/2021 (addition of arterial for statin 20 mg, aspirin/Brilinta),   PAIN:  Are you having pain? No  PRECAUTIONS: None  WEIGHT BEARING RESTRICTIONS No  FALLS:  Has patient fallen in last 6 months? Yes. Number of falls 5  LIVING ENVIRONMENT: Lives with: lives with their family and lives alone Lives in: House/apartment Stairs: No Has following equipment at home: Gilford Rile - 4 wheeled, shower chair, Grab bars, and None  OCCUPATION: Comptroller, office work, Government social research officer.  PLOF: Independent  PATIENT GOALS Increase strength, return to his active lifestyle. 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 Travels to attend concerts.   OBJECTIVE:   MUSCLE LENGTH: Hamstrings: Right 62 deg; Left 68 deg   POSTURE: rounded shoulders   LOWER EXTREMITY ROM: WFL throughout, R hip with mild     tightness.   LOWER EXTREMITY MMT:  MMT Right eval Left eval  Hip flexion 4- 3+  Hip extension 4- 3+  Hip abduction 4- 3+  Hip adduction    Hip internal rotation    Hip external rotation    Knee flexion 4 4-  Knee extension 4 4-  Ankle dorsiflexion 4 4  Ankle plantarflexion    Ankle inversion    Ankle eversion     (Blank rows = not tested)   FUNCTIONAL TESTS:    5x STS18.22 BERG 43/56   TODAY'S TREATMENT: 03/03/22 Nustep L5, 28mns Step ups 4" x10, 6" x10 Half tandem 30s Full tandem   R 9, 12, 30  L 5,5, 30 Walking 4 lap no breaks with  cane STS from elevated table 2x10 Supine SLR 2x10 Bridges 3x10 Calf raises 3x10   03/01/22 Nustep L5, 836ms STS x10 pushing off handles, x7  Supine SLR Bridges 2x10 3s holds Hamstring stretch 30s Cone taps 20reps with 2 finger support Walking w/quad cane 2 laps     PATIENT EDUCATION:  Education details: POC Person educated: Patient Education method: Explanation, [employer], and Verbal cues Education comprehension: verbalized understanding, returned demonstration, verbal cues required, and tactile cues required    Patent educated about keeping lights on during the night for safety and to prevent future falls. Also educated about collection of HEP, and to not do all exercises but rather pick a few to do in order to avoid fatigue.  HOME EXERCISE PROGRAM: [id number]:  CLINICAL IMPRESSION: Patient is showing good improvements in LE strength by being able to complete more sets without long breaks and walk 4 laps without a rest. Continued to focus on LE strengthening and balance, he is able to complete half tandem and full tandem after a few  attempts. He is using his cane to get around for short distances and will continue to progress as tolerated for next visits.    OBJECTIVE IMPAIRMENTS Abnormal gait, decreased activity tolerance, decreased balance, decreased coordination, decreased endurance, decreased mobility, difficulty walking, decreased strength, improper body mechanics, and postural dysfunction.   ACTIVITY LIMITATIONS carrying, lifting, bending, standing, squatting, stairs, transfers, bed mobility, and locomotion level  PARTICIPATION LIMITATIONS: meal prep, cleaning, laundry, driving, shopping, community activity, occupation, and yard work  PERSONAL FACTORS Past/current experiences are also affecting patient's functional outcome.   REHAB POTENTIAL: Good  CLINICAL DECISION MAKING: Evolving/moderate complexity  EVALUATION COMPLEXITY: Moderate   GOALS: Goals  reviewed with patient? Yes  SHORT TERM GOALS: Target date: 03/17/2022   I with basic HEP Baseline: Goal status: INITIAL    LONG TERM GOALS: Target date: 05/12/2022   I with final HEP Baseline:  Goal status: INITIAL  2.  Patient will score at least 50/56 on BERG Balance Exam Baseline: 43 Goal status: INITIAL  3.  Paitent will complete 5x sit to stand in < 12 sec to demonstrate improved strength in BLE. Baseline:  Goal status: INITIAL  4.  Patient will ambulate at least 1000' with LRAD, MI, on level and unlevel surfaces Baseline:  Goal status: INITIAL  5.  Patient will be able to manage cooking and cleaning activities in his home without falling and without requiring a rest break. Baseline:  Goal status: INITIAL   PLAN: PT FREQUENCY: 2x/week  PT DURATION: 12 weeks  PLANNED INTERVENTIONS: Therapeutic exercises, Therapeutic activity, Neuromuscular re-education, Balance training, Gait training, Patient/Family education, Joint mobilization, Stair training, and Manual therapy  PLAN FOR NEXT SESSION: gait and balance activities, LE strengthening progress as tolerated   Andris Baumann, DPT 03/03/2022, 10:15 AM

## 2022-03-03 ENCOUNTER — Ambulatory Visit: Payer: Medicare Other | Admitting: Physician Assistant

## 2022-03-03 ENCOUNTER — Encounter: Payer: Self-pay | Admitting: Occupational Therapy

## 2022-03-03 ENCOUNTER — Ambulatory Visit: Payer: Medicare Other

## 2022-03-03 ENCOUNTER — Ambulatory Visit: Payer: Medicare Other | Admitting: Occupational Therapy

## 2022-03-03 DIAGNOSIS — R2689 Other abnormalities of gait and mobility: Secondary | ICD-10-CM

## 2022-03-03 DIAGNOSIS — R2681 Unsteadiness on feet: Secondary | ICD-10-CM | POA: Diagnosis not present

## 2022-03-03 DIAGNOSIS — M25512 Pain in left shoulder: Secondary | ICD-10-CM | POA: Diagnosis not present

## 2022-03-03 DIAGNOSIS — M6281 Muscle weakness (generalized): Secondary | ICD-10-CM

## 2022-03-03 DIAGNOSIS — R296 Repeated falls: Secondary | ICD-10-CM | POA: Diagnosis not present

## 2022-03-03 DIAGNOSIS — M25511 Pain in right shoulder: Secondary | ICD-10-CM

## 2022-03-03 DIAGNOSIS — R262 Difficulty in walking, not elsewhere classified: Secondary | ICD-10-CM | POA: Diagnosis not present

## 2022-03-03 DIAGNOSIS — M3320 Polymyositis, organ involvement unspecified: Secondary | ICD-10-CM

## 2022-03-03 DIAGNOSIS — R278 Other lack of coordination: Secondary | ICD-10-CM | POA: Diagnosis not present

## 2022-03-03 NOTE — Therapy (Unsigned)
OUTPATIENT OCCUPATIONAL THERAPY TREATMENT NOTE   Patient Name: [name] MRN: [id number] DOB:November 20, 1955, 66 y.o., male Today's Date: 03/03/2022  PCP: Greig Right, MD REFERRING PROVIDER: Cathlyn Parsons, PA-C   END OF SESSION:   OT End of Session - 03/03/22 1015     Visit Number 4    Date for OT Re-Evaluation 05/12/22    Authorization Type BCBS Medicare    Authorization Time Period VL: [place]    OT Start Time 1014    OT Stop Time 1055    OT Time Calculation (min) 41 min    Activity Tolerance Patient tolerated treatment well    Behavior During Therapy WFL for tasks assessed/performed            Past Medical History:  Diagnosis Date   Arthritis    Diabetes mellitus    fasting 120s   History of kidney stones    Hypertension    Peripheral neuropathy    left fifth finger, left toe   Pneumonia    hx of   Past Surgical History:  Procedure Laterality Date   APPENDECTOMY     CORONARY STENT INTERVENTION N/A 12/28/2021   Procedure: CORONARY STENT INTERVENTION;  Surgeon: Jettie Booze, MD;  Location: [facility];  Service: Cardiovascular;  Laterality: N/A;   CYSTOSCOPY W/ URETERAL STENT PLACEMENT Bilateral 03/17/2014   Procedure: CYSTOSCOPY WITH RIGHT STENT PULL  ;  Surgeon: Alexis Frock, MD;  Location: [facility];  Service: Urology;  Laterality: Bilateral;   CYSTOSCOPY WITH RETROGRADE PYELOGRAM, URETEROSCOPY AND STENT PLACEMENT Bilateral 02/01/2014   Procedure: CYSTOSCOPY WITH BILATERAL RETROGRADE CYSTOSCOPY, BILATERAL RETROGRADE, PYELOGRAM, RIGHT URETEROSCOPY, STONE BASKETRY,  AND BILATERAL STENT PLACEMENT;  Surgeon: Alexis Frock, MD;  Location: [facility];  Service: Urology;  Laterality: Bilateral;   CYSTOSCOPY WITH URETEROSCOPY AND STENT PLACEMENT Left 02/12/2021   Procedure: CYSTOSCOPY WITH LEFT URETEROSCOPY, BALLOON DILITATION, HOLMIUM LASER LITHOTRIPSY AND STENT PLACEMENT;  Surgeon: Lucas Mallow, MD;  Location: [facility];  Service: Urology;  Laterality:  Left;   HOLMIUM LASER APPLICATION Left 11/07/9415   Procedure: HOLMIUM LASER APPLICATION;  Surgeon: Alexis Frock, MD;  Location: [facility];  Service: Urology;  Laterality: Left;   INTRAVASCULAR ULTRASOUND/IVUS N/A 12/28/2021   Procedure: Intravascular Ultrasound/IVUS;  Surgeon: Jettie Booze, MD;  Location: [facility];  Service: Cardiovascular;  Laterality: N/A;   LEFT HEART CATH AND CORONARY ANGIOGRAPHY N/A 12/28/2021   Procedure: LEFT HEART CATH AND CORONARY ANGIOGRAPHY;  Surgeon: Jettie Booze, MD;  Location: [facility];  Service: Cardiovascular;  Laterality: N/A;   LITHOTRIPSY     NEPHROLITHOTOMY Left 03/17/2014   Procedure: NEPHROLITHOTOMY PERCUTANEOUS with access;  Surgeon: Alexis Frock, MD;  [facility];  Service: Urology;  Laterality: Left;   NEPHROLITHOTOMY Left 03/19/2014   Procedure: 2ND STAGE NEPHROLITHOTOMY PERCUTANEOUS left ureteroscopy with lasr stone treatment and stent placement, with access;  Surgeon: Alexis Frock, MD;  Location: [facility];  Service: Urology;  Laterality: Left;   TOTAL KNEE ARTHROPLASTY Right 09/02/2014   Procedure: RIGHT TOTAL KNEE ARTHROPLASTY;  Surgeon: Meredith Pel, MD;  Location: [facility];  Service: Orthopedics;  Laterality: Right;   uretral stent     Patient Active Problem List   Diagnosis Date Noted   Myalgia due to statin 02/28/2022   Mild aortic stenosis 02/28/2022   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, chartered at the [employer]; travels for bands/festivals; yardwork  OBJECTIVE:   HAND DOMINANCE: Left  FUNCTIONAL OUTCOME MEASURES: Quick Dash: 59.1  - moderate functional impact Unable to do heavy household chores; wash back; perform recreational activities w/ force/impact through UE; work or perform other regular daily activities due to UE problem; UE problem has interfered quite a bit with normal social activities  UPPER EXTREMITY ROM     Active ROM Right Eval - 5/25 Left Eval - 5/25  Shoulder flexion 51 55  Shoulder abduction 50 58  Shoulder extension 40 47  Shoulder internal rotation 80 84  Shoulder external rotation 40 35  Elbow flexion 135 138  Elbow extension 41 6  (Blank rows = not tested)   TODAY'S TREATMENT: Shoulder flexion standing against the wall Shoulder shrugs PROM Place-and-hold Shoulder flexion w/ elbows bent to 90 degrees  Closed-chain shoulder flexion w/ 1# dowel while seated 4x10 w/ rest breaks between sets; OT provided verbal cues to decrease  compensatory movement patterns/facilitate elbow extension; able to achieve approx 45 degrees of flexion Single-arm shoulder ER w/ 1 lb dumbbell and towel roll positioned under UE; alternating sides completing 3x10 each side w/out difficulty BUE rows w/ anchored theraband (red), focusing on scapular retraction; completed 2x20 w/out difficulty or increased pain while seated EOM Trunk flexion holding 1 kg heavymed ball and slightly reaching forward to tap target; completed 3x10 w/ rest breaks between sets. OT also demonstrated core strengthening exercise holding on to anchored theraband and flexing trunk forward against resistance; pt did not return demonstration Vasopneumatic modality to L shoulder for 10 min, low pressure, 50 degrees at conclusion of session; no adverse reaction   PATIENT EDUCATION: Reviewed condition-specific education for R rotator cuff injury, including precautions and positional, movement, and lifting considerations Person educated: Patient Education method: Explanation and Handouts Education comprehension: verbalized understanding   HOME EXERCISE PROGRAM: MedBridge Code: [id number] - Supine Shoulder Flexion with Dowel to 90  - 2 x daily - 2 sets - 10-15 reps   GOALS: Goals reviewed with patient? Yes  SHORT TERM GOALS: Target date: 03/31/22  STG  Status:  1 Pt will verbalize understanding of at least 3 fall prevention and/or energy conservation strategies to increase safety w/ functional mobility at home Baseline: Decreased knowledge of strategies On-going  2 Pt will demonstrate independence w/ recommended DME and/or AE (e.g., long-handle  sponge, dressing stick, etc.) to improve participation and safety w/ ADLs Baseline: Decreased knowledge of AE On-going  3 Pt will improve functional use of BUEs as evidence by being able to achieve at least 70 degrees of shoulder flexion w/out pain Baseline: RUE: 51*, LUE: 55* On-going  4 Pt will improve functional bilateral reach by  increasing R elbow extension to at least 25 degrees Baseline: 41* of flexion when attempting extension On-going    LONG TERM GOALS: Target date: 05/12/22  LTG  Status:  1 Pt will demonstrate independence w/ full HEP designed for ROM and strength of BUEs Baseline: No HEP at this time On-going  2 Pt will improve QuickDASH score by at least 19 points to indicate mild functional impairment Baseline: 59.1 (moderate, borderline severe) impairment) On-going  3 Pt will improve functional use of BUEs as evidence by improving strength of BUE shoulder flexion to at least 3+/5  Baseline: To be assessed On-going  4 With both hands, pt will be able to grip w/ at least 20 lbs of force to improve functional use of BUEs during IADLs Baseline: Unable to assess at eval due to medical restrictions On-going  5 Pt will be able to retrieve lightweight object (< 5 lbs) from overhead height w/out pain by d/c Baseline: Unable to reach overhead On-going    ASSESSMENT:  CLINICAL IMPRESSION: Pt returns for first treatment session following initial evaluation on 02/17/22. Since prior session, pt did seek additional medical care related to fall reported on 02/16/22 due to worsening bruising around L eye; this is visible today, but pt reports it is much improved and imaging revealed no further concerns. Pt also attended follow-up appt w/ Dr. Marlou [facility]) yesterday (02/23/22) regarding his R shoulder pain w/ no indication of "progression of rotator cuff tear which was partial near full-thickness tear several months ago," per MD note. This session, OT focused on education regarding both fall prevention and energy conservation considering significance of fall 1 day after d/c from hospital and notable generalized weakness bilaterally; pt verbalized understanding of all strategies. OT then introduced gentle AAROM of shoulder flexion in supine position for improved scapular alignment w/ retraction. Pt required Mod A to return to  seated position from supine w/out UE support pt reports he uses w/ transition to home.  PERFORMANCE DEFICITS in functional skills including ADLs, IADLs, ROM, strength, pain, GMC, mobility, balance, body mechanics, endurance, cardiopulmonary status limiting function, decreased knowledge of precautions, decreased knowledge of use of DME, and UE functional use.   IMPAIRMENTS are limiting patient from ADLs, IADLs, rest and sleep, work, leisure, and social participation.   COMORBIDITIES has co-morbidities such as NSTEMI, CAD, and DM  that affects occupational performance. Patient will benefit from skilled OT to address above impairments and improve overall function.   PLAN: OT FREQUENCY: 2x/week  OT DURATION: 10 weeks  PLANNED INTERVENTIONS: self care/ADL training, therapeutic exercise, therapeutic activity, manual therapy, passive range of motion, functional mobility training, aquatic therapy, moist heat, cryotherapy, patient/family education, energy conservation, DME and/or AE instructions, and Re-evaluation  RECOMMENDED OTHER SERVICES: Currently receiving PT services at this location  CONSULTED AND AGREED WITH PLAN OF CARE: Patient  PLAN FOR NEXT SESSION: Continue w/ RUE gentle ROM and strengthening as able   Kathrine Cords, MSOT, OTR/L 03/03/2022, 11:02 AM

## 2022-03-07 ENCOUNTER — Ambulatory Visit (INDEPENDENT_AMBULATORY_CARE_PROVIDER_SITE_OTHER): Payer: Medicare Other | Admitting: Neurology

## 2022-03-07 ENCOUNTER — Encounter: Payer: Self-pay | Admitting: Neurology

## 2022-03-07 VITALS — BP 150/66 | HR 78 | Ht 70.0 in | Wt 182.5 lb

## 2022-03-07 DIAGNOSIS — R269 Unspecified abnormalities of gait and mobility: Secondary | ICD-10-CM | POA: Diagnosis not present

## 2022-03-07 DIAGNOSIS — I639 Cerebral infarction, unspecified: Secondary | ICD-10-CM

## 2022-03-07 DIAGNOSIS — G7249 Other inflammatory and immune myopathies, not elsewhere classified: Secondary | ICD-10-CM | POA: Insufficient documentation

## 2022-03-07 MED ORDER — METHOTREXATE 2.5 MG PO TABS: 15.0000 mg | ORAL_TABLET | ORAL | 11 refills | Status: DC

## 2022-03-07 NOTE — Therapy (Signed)
OUTPATIENT PHYSICAL THERAPY LOWER EXTREMITY TREATMENT   Patient Name: [name] MRN: [id number] DOB:1956/02/09, 66 y.o., male Today's Date: 03/08/2022   PT End of Session - 03/08/22 0922     Visit Number 5    Progress Note Due on Visit 10    PT Start Time 0923    PT Stop Time 1002    PT Time Calculation (min) 39 min    Equipment Utilized During Treatment Gait belt    Activity Tolerance Patient tolerated treatment well;Patient limited by fatigue    Behavior During Therapy WFL for tasks assessed/performed                 Past Medical History:  Diagnosis Date   Arthritis    Diabetes mellitus    fasting 120s   History of kidney stones    Hypertension    Peripheral neuropathy    left fifth finger, left toe   Pneumonia    hx of   Past Surgical History:  Procedure Laterality Date   APPENDECTOMY     CORONARY STENT INTERVENTION N/A 12/28/2021   Procedure: CORONARY STENT INTERVENTION;  Surgeon: Jettie Booze, MD;  Location: [facility];  Service: Cardiovascular;  Laterality: N/A;   CYSTOSCOPY W/ URETERAL STENT PLACEMENT Bilateral 03/17/2014   Procedure: CYSTOSCOPY WITH RIGHT STENT PULL  ;  Surgeon: Alexis Frock, MD;  [facility];  Service: Urology;  Laterality: Bilateral;   CYSTOSCOPY WITH RETROGRADE PYELOGRAM, URETEROSCOPY AND STENT PLACEMENT Bilateral 02/01/2014   Procedure: CYSTOSCOPY WITH BILATERAL RETROGRADE CYSTOSCOPY, BILATERAL RETROGRADE, PYELOGRAM, RIGHT URETEROSCOPY, STONE BASKETRY,  AND BILATERAL STENT PLACEMENT;  Surgeon: Alexis Frock, MD;  [facility];  Service: Urology;  Laterality: Bilateral;   CYSTOSCOPY WITH URETEROSCOPY AND STENT PLACEMENT Left 02/12/2021   Procedure: CYSTOSCOPY WITH LEFT URETEROSCOPY, BALLOON DILITATION, HOLMIUM LASER LITHOTRIPSY AND STENT PLACEMENT;  Surgeon: Lucas Mallow, MD;  [facility];  Service: Urology;  Laterality: Left;   HOLMIUM LASER APPLICATION Left 9/67/5916   Procedure: HOLMIUM LASER  APPLICATION;  Surgeon: Alexis Frock, MD;  [facility];  Service: Urology;  Laterality: Left;   INTRAVASCULAR ULTRASOUND/IVUS N/A 12/28/2021   Procedure: Intravascular Ultrasound/IVUS;  Surgeon: Jettie Booze, MD;  Location: [facility];  Service: Cardiovascular;  Laterality: N/A;   LEFT HEART CATH AND CORONARY ANGIOGRAPHY N/A 12/28/2021   Procedure: LEFT HEART CATH AND CORONARY ANGIOGRAPHY;  Surgeon: Jettie Booze, MD;  Location: [facility];  Service: Cardiovascular;  Laterality: N/A;   LITHOTRIPSY     NEPHROLITHOTOMY Left 03/17/2014   Procedure: NEPHROLITHOTOMY PERCUTANEOUS with access;  Surgeon: Alexis Frock, MD;  [facility];  Service: Urology;  Laterality: Left;   NEPHROLITHOTOMY Left 03/19/2014   Procedure: 2ND STAGE NEPHROLITHOTOMY PERCUTANEOUS left ureteroscopy with lasr stone treatment and stent placement, with access;  Surgeon: Alexis Frock, MD;  [facility];  Service: Urology;  Laterality: Left;   TOTAL KNEE ARTHROPLASTY Right 09/02/2014   Procedure: RIGHT TOTAL KNEE ARTHROPLASTY;  Surgeon: Meredith Pel, MD;  Location: [facility];  Service: Orthopedics;  Laterality: Right;   uretral stent     Patient Active Problem List   Diagnosis Date Noted   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   Elevated CK 02/02/2022   Elevated LFTs 02/02/2022   CAD (coronary artery disease) 02/02/2022  Muscle weakness of all 4 extremities 02/02/2022   Hyperlipidemia 01/28/2022   Arthritis of knee 09/02/2014   Staghorn kidney stones 03/17/2014   Nephrolithiasis 02/01/2014   Pneumonia 08/27/2011   Diabetes mellitus with coincident hypertension ([place]) 08/27/2011    PCP: Greig Right, MD  REFERRING PROVIDER: Cathlyn Parsons, PA-C   REFERRING DIAG: M33.20 (ICD-10-CM) -  Polymyositis, organ involvement unspecified   THERAPY DIAG:  Polymyositis ([facility])  Muscle weakness (generalized)  Repeated falls  Other abnormalities of gait and mobility  Unsteadiness on feet  Difficulty in walking, not elsewhere classified  Other lack of coordination  Rationale for Evaluation and Treatment Rehabilitation  ONSET DATE: 02/15/2022   SUBJECTIVE:   SUBJECTIVE STATEMENT:  Patient says he feels pretty strong today, saw neuro yesterday and is taking methotrexate now.   PERTINENT HISTORY: [name] is a 66 y.o. right-handed male with history of type 2 diabetes mellitus hypertension intolerance of statin due to abnormal LFTs (off for the past years), CAD status post DES to RCA 12/28/2021 (addition of arterial for statin 20 mg, aspirin/Brilinta),   PAIN:  Are you having pain? No  PRECAUTIONS: None  WEIGHT BEARING RESTRICTIONS No  FALLS:  Has patient fallen in last 6 months? Yes. Number of falls 5  LIVING ENVIRONMENT: Lives with: lives with their family and lives alone Lives in: House/apartment Stairs: No Has following equipment at home: Gilford Rile - 4 wheeled, shower chair, Grab bars, and None  OCCUPATION: Comptroller, office work, Government social research officer.  PLOF: Independent  PATIENT GOALS Increase strength, return to his active lifestyle. 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 Travels to attend concerts.   OBJECTIVE:   MUSCLE LENGTH: Hamstrings: Right 62 deg; Left 68 deg   POSTURE: rounded shoulders   LOWER EXTREMITY ROM: WFL throughout, R hip with mild     tightness.   LOWER EXTREMITY MMT:  MMT Right eval Left eval  Hip flexion 4- 3+  Hip extension 4- 3+  Hip abduction 4- 3+  Hip adduction    Hip internal rotation    Hip external rotation    Knee flexion 4 4-  Knee extension 4 4-  Ankle dorsiflexion 4 4  Ankle plantarflexion    Ankle inversion    Ankle eversion     (Blank rows = not  tested)   FUNCTIONAL TESTS:    5x STS18.22 BERG 43/56   TODAY'S TREATMENT: 03/08/22 Nustep 30mns STS elevated table with yellow ball x10 then w/red ball OHPx6 Standing playing catch Cybex knee flexion 2x10  15# Cybex knee ext 2x10 15# Lateral and forward step ups on 4" 20reps Walking with cane outside 1 small lap and on grass  Walking without cane 2 laps, minA   03/03/22 Nustep L5, 821ms Step ups 4" x10, 6" x10 Half tandem 30s Full tandem   R 9, 12, 30  L 5,5, 30 Walking 4 lap no breaks with cane STS from elevated table 2x10 Supine SLR 2x10 Bridges 3x10 Calf raises 3x10   03/01/22 Nustep L5, 25m59m STS x10 pushing off handles, x7  Supine SLR Bridges 2x10 3s holds Hamstring stretch 30s Cone taps 20reps with 2 finger support Walking w/quad cane 2 laps     PATIENT EDUCATION:  Education details: POC Person educated: Patient Education method: Explanation, [employer], and Verbal cues Education comprehension: verbalized understanding, returned demonstration, verbal cues required, and tactile cues required    Patent educated about keeping lights on during the night for safety and to prevent  future falls. Also educated about collection of HEP, and to not do all exercises but rather pick a few to do in order to avoid fatigue.  HOME EXERCISE PROGRAM: [id number]  ASSESSMENT:  CLINICAL IMPRESSION: Patient returns to PT using cane to ambulate and states that he is feeling stronger. Started with walking outside, he said he wants to build confidence to walk in his backyard so we tried walking on grass. He continues to show difficulty with overhead activities due to UE weakness. Patient has difficulty stepping onto 6" step but is able to do on 4" with support. Ended session by  walking w/o cane, requires minA due to LOB with turns and staggering.    OBJECTIVE IMPAIRMENTS Abnormal gait, decreased activity tolerance, decreased balance, decreased coordination, decreased endurance,  decreased mobility, difficulty walking, decreased strength, improper body mechanics, and postural dysfunction.   ACTIVITY LIMITATIONS carrying, lifting, bending, standing, squatting, stairs, transfers, bed mobility, and locomotion level  PARTICIPATION LIMITATIONS: meal prep, cleaning, laundry, driving, shopping, community activity, occupation, and yard work  PERSONAL FACTORS Past/current experiences are also affecting patient's functional outcome.   REHAB POTENTIAL: Good  CLINICAL DECISION MAKING: Evolving/moderate complexity  EVALUATION COMPLEXITY: Moderate   GOALS: Goals reviewed with patient? Yes  SHORT TERM GOALS: Target date: 03/17/2022   I with basic HEP Baseline: Goal status: INITIAL    LONG TERM GOALS: Target date: 05/12/2022   I with final HEP Baseline:  Goal status: INITIAL  2.  Patient will score at least 50/56 on BERG Balance Exam Baseline: 43 Goal status: INITIAL  3.  Paitent will complete 5x sit to stand in < 12 sec to demonstrate improved strength in BLE. Baseline:  Goal status: INITIAL  4.  Patient will ambulate at least 1000' with LRAD, MI, on level and unlevel surfaces Baseline:  Goal status: INITIAL  5.  Patient will be able to manage cooking and cleaning activities in his home without falling and without requiring a rest break. Baseline:  Goal status: INITIAL   PLAN: PT FREQUENCY: 2x/week  PT DURATION: 12 weeks  PLANNED INTERVENTIONS: Therapeutic exercises, Therapeutic activity, Neuromuscular re-education, Balance training, Gait training, Patient/Family education, Joint mobilization, Stair training, and Manual therapy  PLAN FOR NEXT SESSION: gait and balance activities, LE strengthening progress as tolerated   Andris Baumann, DPT 03/08/2022, 10:06 AM

## 2022-03-07 NOTE — Progress Notes (Addendum)
Chief Complaint  Patient presents with   New Patient (Initial Visit)    Rm 15. Accompanied by daughter. NP/internal referral for CVA and inflammatory myositis.      ASSESSMENT AND PLAN  [name] is a 66 y.o. male   Inflammatory myopathy  Significantly elevated CPK up to 5400, much improved after steroid treatment  But he still have profound bilateral upper and lower extremity proximal muscle weakness, neck flexion weakness, on tapering dose of high-dose of prednisone,  Will continue to taper down prednisone at 10 mg decrement every week, not a good candidate for long-term prednisone treatment due to diabetes,  Add on methotrexate 2.5 mg 6 tablets weekly  Preauthorization for IVIG  Referral to surgeon for right vastus lateralis muscle biopsy  EMG nerve conduction study  Laboratory for baseline  Right pontine stroke Heart attack  With mild residual left upper and lower extremity weakness, hyperreflexia on the left side,  Vascular risk factor of aging, hypertension, hyperlipidemia, diabetes  On aspirin and Plavix no   DIAGNOSTIC DATA (LABS, IMAGING, TESTING) - I reviewed patient records, labs, notes, testing and imaging myself where available.   MEDICAL HISTORY:  [name] is a 66 year old male, seen in request by his primary care physician Dr. Greig Right, to follow-up for stroke, inflammatory myopathy, initial evaluation was with her daughter on March 07, 2022,  I reviewed and summarized the referring note. PMHx. HTN CAD, April 2023, was on brilinta DM since 2017  He has been physically active, suffered heart attack in April 2023 while pushing mowing his lawn, was noted to have elevated troponin, underwent stent, double antiplatelet treatment aspirin and Brilinta  During that hospital admission, he was found to have elevated abnormal liver functional test, progressive weakness since the end of 2022, his atorvastatin 20 mg was on hold since then  Hospital  admission on Feb 02, 2022 for weakness, to the point his father has to help him to get into the car, laboratory evaluation showed CPK 7291, AST 181, ALT  CT of the chest abdomen and pelvis with contrast negative for evidence of metastatic disease, extensive coronary artery calcification  He was seen by neurologist, with his presentation of symmetric bilateral upper and lower extremity weakness, consistent with inflammatory myositis  MRI of the brain showed acute infarction involving ventral pontine  CT angiogram of head and neck showed right ICA stenosis measuring at least 70% proximally, 70% at the paraclinoid segment, 50% left internal carotid artery stenosis, bilateral vertebral origin stenosis measuring 50% on the right 40% on the left  Laboratory evaluations in June 2023, CPK 991, CBC showed elevated WBC 21.7 hemoglobin 13.7, BMP creatinine 0.5,  HMGCR ig antibody was significantly elevated 97 units, which may be associated with necrotizing autoimmune myopathy in a subset of statin treated patients, although infrequent, this antibody may also be observed in statin nave patient with necrotizing autoimmune myopathy,  Normal magnesium, phosphate, elevated ALT 262, AST 65, A1c 7.4, LDL was 97.8, normal TSH, negative HIV, negative D-dimer, lipase,  MRI of cervical spine showed multilevel degenerative changes, no significant canal stenosis, C3-4 severe left, C5-6 severe right, C6-7 severe left and moderate right neuroforaminal stenosis  He was treated with Solu-Medrol 1 g daily for 3 days followed by p.o. prednisone tapering starting from 80 mg daily, decrement of 10 mg every 1 week, will be taking 50 mg March 08, 2022  Case was discussed with Dr. Saverio Danker of general surgeon, planning on having muscle biopsy,  he was discharged to rehabilitation, follow-up visit did showed improvement of his muscle weakness, was able to ambulate, the response to steroid also favored the diagnosis of  inflammatory myopathy, he was discharged to inpatient rehabilitation, discharged on Feb 16, 2022, he now lives alone at home, ambulated with a cane  Emergency presentation 3 days later Feb 19, 2022, fell, hit his left frontal region on the back,  CT head showed no acute abnormality  He was not on any antiplatelet agent prior to hospital admission, now taking aspirin and Plavix, he has some improvement, but far from his baseline, has difficulty raising arm overhead, could not put plate in the cabinet, gait abnormality,   PHYSICAL EXAM:   Vitals:   03/07/22 1248  BP: (!) 150/66  Pulse: 78  Weight: 182 lb 8 oz (82.8 kg)  Height: '5\' 10"'$  (1.778 m)     Body mass index is 26.19 kg/m.  PHYSICAL EXAMNIATION:  Gen: NAD, conversant, well nourised, well groomed                     Cardiovascular: Regular rate rhythm, no peripheral edema, warm, nontender. Eyes: Conjunctivae clear without exudates or hemorrhage Neck: Supple, no carotid bruits. Pulmonary: Clear to auscultation bilaterally   NEUROLOGICAL EXAM:  MENTAL STATUS: Speech/cognition: Awake, alert, oriented to history taking and casual conversation CRANIAL NERVES: CN II: Visual fields are full to confrontation. Pupils are round equal and briskly reactive to light. CN III, IV, VI: extraocular movement are normal. No ptosis. CN V: Facial sensation is intact to light touch CN VII: Face is symmetric with normal eye closure  CN VIII: Hearing is normal to causal conversation. CN IX, X: Phonation is normal. CN XI: Head turning and shoulder shrug are intact  MOTOR: Limited range of motion of right shoulder, due to right shoulder rotator cuff injury, mild to moderate neck flexion weakness  UE Shoulder Abduction Shoulder External Rotation Elbow Flexion Elbow  Extension Wrist Flexion Wrist Extension Grip  R '3 3 4 4 '$ 5- 5- 5  L '3 3 4 4 '$ 5- 5- 5   LE Hip Flexion Knee flexion Knee extension Ankle Dorsiflexion  R '3 4 4 '$ 5-  L '3 4 4 5      '$ REFLEXES: Reflexes are mildly hyperreflexia on left upper extremity, patella, bilateral Babinski sign   SENSORY: Intact to light touch, pinprick and vibratory sensation are intact in fingers and toes.  COORDINATION: There is no trunk or limb dysmetria noted.  GAIT/STANCE: Need push-up to get up from seated position, lordotic, wide-based, unsteady  REVIEW OF SYSTEMS:  Full 14 system review of systems performed and notable only for as above All other review of systems were negative.   ALLERGIES: Allergies  Allergen Reactions   Atorvastatin Calcium Other (See Comments)    Statin induced myopathy and transaminitis    HOME MEDICATIONS: Current Outpatient Medications  Medication Sig Dispense Refill   acetaminophen (TYLENOL) 325 MG tablet Take 1-2 tablets (325-650 mg total) by mouth every 4 (four) hours as needed for mild pain.     amLODipine (NORVASC) 10 MG tablet Take 1 tablet (10 mg total) by mouth every morning. 30 tablet 0   aspirin 81 MG EC tablet Take 1 tablet (81 mg total) by mouth daily. Swallow whole. 90 tablet 3   carvedilol (COREG) 12.5 MG tablet Take 1 tablet (12.5 mg total) by mouth 2 (two) times daily with a meal. 60 tablet 11   cholecalciferol (VITAMIN D) 1000 units  tablet Take 1 tablet (1,000 Units total) by mouth daily. 30 tablet 0   citalopram (CELEXA) 20 MG tablet Take 1 tablet (20 mg total) by mouth daily. 30 tablet 0   clopidogrel (PLAVIX) 75 MG tablet 1st dose take 300 mg then 75 mg daily 94 tablet 3   Cyanocobalamin (VITAMIN B-12) 2500 MCG SUBL Place 1 tablet (2,500 mcg total) under the tongue daily. 30 tablet 0   cyclobenzaprine (FLEXERIL) 10 MG tablet Take 1 tablet (10 mg total) by mouth at bedtime. 30 tablet 0   diclofenac Sodium (VOLTAREN) 1 % GEL Apply 2 g topically 4 (four) times daily. 100 g 0   Evolocumab (REPATHA SURECLICK) 096 MG/ML SOAJ Inject 1 pen. into the skin every 14 (fourteen) days. 2 mL 11   insulin glargine (LANTUS) 100 UNIT/ML Solostar  Pen Inject 12 Units into the skin 2 (two) times daily. 15 mL 11   insulin lispro (HUMALOG) 100 UNIT/ML KwikPen Inject 4 Units into the skin daily at 6 (six) AM.     lisinopril (ZESTRIL) 40 MG tablet Take 1 tablet (40 mg total) by mouth every morning. 30 tablet 0   metFORMIN (GLUCOPHAGE) 500 MG tablet Take 2 tablets (1,000 mg total) by mouth 2 (two) times daily with a meal. 60 tablet 0   Multiple Vitamins-Minerals (MULTIVITAMINS THER. W/MINERALS) TABS Take 1 tablet by mouth daily.     nitroGLYCERIN (NITROSTAT) 0.4 MG SL tablet Place 1 tablet (0.4 mg total) under the tongue every 5 (five) minutes x 3 doses as needed for chest pain. 25 tablet 12   Omega-3 Fatty Acids (FISH OIL) 1200 MG CAPS Take 1,200 mg by mouth daily.     predniSONE (DELTASONE) 10 MG tablet Take 7 tablets (70 mg total) by mouth daily with breakfast for 6 days, THEN 6 tablets (60 mg total) daily with breakfast for 7 days, THEN 5 tablets (50 mg total) daily with breakfast for 7 days, THEN 4 tablets (40 mg total) daily with breakfast for 7 days, THEN 3 tablets (30 mg total) daily with breakfast for 7 days, THEN 2 tablets (20 mg total) daily with breakfast for 7 days, THEN 1 tablet (10 mg total) daily with breakfast. 212 tablet 0   No current facility-administered medications for this visit.    PAST MEDICAL HISTORY: Past Medical History:  Diagnosis Date   Arthritis    Diabetes mellitus    fasting 120s   History of kidney stones    Hypertension    Peripheral neuropathy    left fifth finger, left toe   Pneumonia    hx of    PAST SURGICAL HISTORY: Past Surgical History:  Procedure Laterality Date   APPENDECTOMY     CORONARY STENT INTERVENTION N/A 12/28/2021   Procedure: CORONARY STENT INTERVENTION;  Surgeon: Jettie Booze, MD;  Location: [facility];  Service: Cardiovascular;  Laterality: N/A;   CYSTOSCOPY W/ URETERAL STENT PLACEMENT Bilateral 03/17/2014   Procedure: CYSTOSCOPY WITH RIGHT STENT PULL  ;  Surgeon:  Alexis Frock, MD;  [facility]: [facility];  Service: Urology;  Laterality: Bilateral;   CYSTOSCOPY WITH RETROGRADE PYELOGRAM, URETEROSCOPY AND STENT PLACEMENT Bilateral 02/01/2014   Procedure: CYSTOSCOPY WITH BILATERAL RETROGRADE CYSTOSCOPY, BILATERAL RETROGRADE, PYELOGRAM, RIGHT URETEROSCOPY, STONE BASKETRY,  AND BILATERAL STENT PLACEMENT;  Surgeon: Alexis Frock, MD;  Location: [facility];  Service: Urology;  Laterality: Bilateral;   CYSTOSCOPY WITH URETEROSCOPY AND STENT PLACEMENT Left 02/12/2021   Procedure: CYSTOSCOPY WITH LEFT URETEROSCOPY, BALLOON DILITATION, HOLMIUM LASER LITHOTRIPSY AND STENT  PLACEMENT;  Surgeon: Lucas Mallow, MD;  [facility]: [facility];  Service: Urology;  Laterality: Left;   HOLMIUM LASER APPLICATION Left 01/05/8785   Procedure: HOLMIUM LASER APPLICATION;  Surgeon: Alexis Frock, MD;  [facility];  Service: Urology;  Laterality: Left;   INTRAVASCULAR ULTRASOUND/IVUS N/A 12/28/2021   Procedure: Intravascular Ultrasound/IVUS;  Surgeon: Jettie Booze, MD;  Location: [facility];  Service: Cardiovascular;  Laterality: N/A;   LEFT HEART CATH AND CORONARY ANGIOGRAPHY N/A 12/28/2021   Procedure: LEFT HEART CATH AND CORONARY ANGIOGRAPHY;  Surgeon: Jettie Booze, MD;  Location: [facility];  Service: Cardiovascular;  Laterality: N/A;   LITHOTRIPSY     NEPHROLITHOTOMY Left 03/17/2014   Procedure: NEPHROLITHOTOMY PERCUTANEOUS with access;  Surgeon: Alexis Frock, MD;  [facility];  Service: Urology;  Laterality: Left;   NEPHROLITHOTOMY Left 03/19/2014   Procedure: 2ND STAGE NEPHROLITHOTOMY PERCUTANEOUS left ureteroscopy with lasr stone treatment and stent placement, with access;  Surgeon: Alexis Frock, MD;  [facility];  Service: Urology;  Laterality: Left;   TOTAL KNEE ARTHROPLASTY Right 09/02/2014   Procedure: RIGHT TOTAL KNEE ARTHROPLASTY;  Surgeon: Meredith Pel, MD;  Location: [facility];  Service: Orthopedics;  Laterality: Right;   uretral stent       FAMILY HISTORY: Family History  Problem Relation Age of Onset   Alzheimer's disease Mother    Heart attack Father     SOCIAL HISTORY: Social History   Socioeconomic History   Marital status: Widowed    Spouse name: Not on file   Number of children: 2   Years of education: 3 yeae   Highest education level: Not on file  Occupational History   Not on file  Tobacco Use   Smoking status: Never   Smokeless tobacco: Never  Vaping Use   Vaping Use: Never used  Substance and Sexual Activity   Alcohol use: Yes    Alcohol/week: 2.0 standard drinks of alcohol    Types: 2 Cans of beer per week    Comment: occasional/ 1-2 beers week   Drug use: No   Sexual activity: Yes    Birth control/protection: Other-see comments  Other Topics Concern   Not on file  Social History Narrative   Not on file   Social Determinants of Health   Financial Resource Strain: Not on file  Food Insecurity: Not on file  Transportation Needs: Not on file  Physical Activity: Not on file  Stress: Not on file  Social Connections: Not on file  Intimate Partner Violence: Not on file    Total time spent reviewing the chart, obtaining history, examined patient, ordering tests, documentation, consultations and family, care coordination was 72 mintues    Marcial Pacas, M.D. Ph.D.  [facility] [address] [place], [place] [address] Ph: [phone] Fax: [phone]  CC:  Cathlyn Parsons, PA-C [place],   [id number]  Greig Right, MD

## 2022-03-07 NOTE — Patient Instructions (Addendum)
Please keep lowering your prednisone as previously scheduled, keep at '20mg'$  daily.  Inflammatory myopathy inflammatory myopathy Folic acid '1mg'$  daily.

## 2022-03-08 ENCOUNTER — Ambulatory Visit: Payer: Medicare Other

## 2022-03-08 DIAGNOSIS — M25511 Pain in right shoulder: Secondary | ICD-10-CM | POA: Diagnosis not present

## 2022-03-08 DIAGNOSIS — M25512 Pain in left shoulder: Secondary | ICD-10-CM | POA: Diagnosis not present

## 2022-03-08 DIAGNOSIS — R262 Difficulty in walking, not elsewhere classified: Secondary | ICD-10-CM | POA: Diagnosis not present

## 2022-03-08 DIAGNOSIS — M6281 Muscle weakness (generalized): Secondary | ICD-10-CM

## 2022-03-08 DIAGNOSIS — R2681 Unsteadiness on feet: Secondary | ICD-10-CM

## 2022-03-08 DIAGNOSIS — R2689 Other abnormalities of gait and mobility: Secondary | ICD-10-CM | POA: Diagnosis not present

## 2022-03-08 DIAGNOSIS — R278 Other lack of coordination: Secondary | ICD-10-CM

## 2022-03-08 DIAGNOSIS — R296 Repeated falls: Secondary | ICD-10-CM | POA: Diagnosis not present

## 2022-03-08 DIAGNOSIS — M3320 Polymyositis, organ involvement unspecified: Secondary | ICD-10-CM | POA: Diagnosis not present

## 2022-03-08 LAB — HEPB+HEPC+HIV PANEL
HIV Screen 4th Generation wRfx: NONREACTIVE
Hep B C IgM: NEGATIVE
Hep B Core Total Ab: NEGATIVE
Hep B E Ab: NEGATIVE
Hep B E Ag: NEGATIVE
Hep B Surface Ab, Qual: NONREACTIVE
Hep C Virus Ab: NONREACTIVE
Hepatitis B Surface Ag: NEGATIVE

## 2022-03-10 ENCOUNTER — Telehealth: Payer: Self-pay | Admitting: Neurology

## 2022-03-10 ENCOUNTER — Ambulatory Visit: Payer: Medicare Other

## 2022-03-10 DIAGNOSIS — M6281 Muscle weakness (generalized): Secondary | ICD-10-CM

## 2022-03-10 DIAGNOSIS — M25512 Pain in left shoulder: Secondary | ICD-10-CM | POA: Diagnosis not present

## 2022-03-10 DIAGNOSIS — R278 Other lack of coordination: Secondary | ICD-10-CM | POA: Diagnosis not present

## 2022-03-10 DIAGNOSIS — R296 Repeated falls: Secondary | ICD-10-CM | POA: Diagnosis not present

## 2022-03-10 DIAGNOSIS — R2689 Other abnormalities of gait and mobility: Secondary | ICD-10-CM | POA: Diagnosis not present

## 2022-03-10 DIAGNOSIS — R262 Difficulty in walking, not elsewhere classified: Secondary | ICD-10-CM

## 2022-03-10 DIAGNOSIS — M3320 Polymyositis, organ involvement unspecified: Secondary | ICD-10-CM | POA: Diagnosis not present

## 2022-03-10 DIAGNOSIS — R2681 Unsteadiness on feet: Secondary | ICD-10-CM | POA: Diagnosis not present

## 2022-03-10 DIAGNOSIS — M25511 Pain in right shoulder: Secondary | ICD-10-CM | POA: Diagnosis not present

## 2022-03-10 NOTE — Therapy (Signed)
OUTPATIENT PHYSICAL THERAPY LOWER EXTREMITY TREATMENT   Patient Name: [name] MRN: [id number] DOB:07/03/1956, 66 y.o., male Today's Date: 03/10/2022   PT End of Session - 03/10/22 0925     Visit Number 6    Progress Note Due on Visit 10    PT Start Time 0930    PT Stop Time 1013    PT Time Calculation (min) 43 min    Equipment Utilized During Treatment Gait belt    Activity Tolerance Patient tolerated treatment well;Patient limited by fatigue    Behavior During Therapy WFL for tasks assessed/performed                 Past Medical History:  Diagnosis Date   Arthritis    Diabetes mellitus    fasting 120s   History of kidney stones    Hypertension    Peripheral neuropathy    left fifth finger, left toe   Pneumonia    hx of   Past Surgical History:  Procedure Laterality Date   APPENDECTOMY     CORONARY STENT INTERVENTION N/A 12/28/2021   Procedure: CORONARY STENT INTERVENTION;  Surgeon: Rodney Booze, MD;  Location: [facility];  Service: Cardiovascular;  Laterality: N/A;   CYSTOSCOPY W/ URETERAL STENT PLACEMENT Bilateral 03/17/2014   Procedure: CYSTOSCOPY WITH Gutierrez STENT PULL  ;  Surgeon: Rodney Frock, MD;  [facility];  Service: Urology;  Laterality: Bilateral;   CYSTOSCOPY WITH RETROGRADE PYELOGRAM, URETEROSCOPY AND STENT PLACEMENT Bilateral 02/01/2014   Procedure: CYSTOSCOPY WITH BILATERAL RETROGRADE CYSTOSCOPY, BILATERAL RETROGRADE, PYELOGRAM, Gutierrez URETEROSCOPY, STONE BASKETRY,  AND BILATERAL STENT PLACEMENT;  Surgeon: Rodney Frock, MD;  [facility];  Service: Urology;  Laterality: Bilateral;   CYSTOSCOPY WITH URETEROSCOPY AND STENT PLACEMENT Left 02/12/2021   Procedure: CYSTOSCOPY WITH LEFT URETEROSCOPY, BALLOON DILITATION, HOLMIUM LASER LITHOTRIPSY AND STENT PLACEMENT;  Surgeon: Rodney Mallow, MD;  [facility];  Service: Urology;  Laterality: Left;   HOLMIUM LASER APPLICATION Left 0/25/8527   Procedure: HOLMIUM LASER  APPLICATION;  Surgeon: Rodney Frock, MD;  [facility];  Service: Urology;  Laterality: Left;   INTRAVASCULAR ULTRASOUND/IVUS N/A 12/28/2021   Procedure: Intravascular Ultrasound/IVUS;  Surgeon: Rodney Booze, MD;  Location: [facility];  Service: Cardiovascular;  Laterality: N/A;   LEFT HEART CATH AND CORONARY ANGIOGRAPHY N/A 12/28/2021   Procedure: LEFT HEART CATH AND CORONARY ANGIOGRAPHY;  Surgeon: Rodney Booze, MD;  Location: [facility];  Service: Cardiovascular;  Laterality: N/A;   LITHOTRIPSY     NEPHROLITHOTOMY Left 03/17/2014   Procedure: NEPHROLITHOTOMY PERCUTANEOUS with access;  Surgeon: Rodney Frock, MD;  [facility];  Service: Urology;  Laterality: Left;   NEPHROLITHOTOMY Left 03/19/2014   Procedure: 2ND STAGE NEPHROLITHOTOMY PERCUTANEOUS left ureteroscopy with lasr stone treatment and stent placement, with access;  Surgeon: Rodney Frock, MD;  [facility];  Service: Urology;  Laterality: Left;   TOTAL KNEE ARTHROPLASTY Gutierrez 09/02/2014   Procedure: Gutierrez TOTAL KNEE ARTHROPLASTY;  Surgeon: Rodney Pel, MD;  Location: [facility];  Service: Orthopedics;  Laterality: Gutierrez;   uretral stent     Patient Active Problem List   Diagnosis Date Noted   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   Gait abnormality 03/07/2022   Cerebrovascular accident (CVA) ([place]) 03/07/2022   Myalgia due to statin 02/28/2022   Mild aortic stenosis 02/28/2022   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   Elevated CK 02/02/2022   Elevated LFTs 02/02/2022   CAD (coronary artery disease) 02/02/2022  Muscle weakness of all 4 extremities 02/02/2022   Hyperlipidemia 01/28/2022   Arthritis of knee 09/02/2014   [place]ag[place] kidney stones 03/17/2014   Nephrolithiasis 02/01/2014   Pneumonia 08/27/2011   Diabetes mellitus with coincident hypertension ([place]) 08/27/2011    PCP: Rodney Right, MD  REFERRING PROVIDER: Cathlyn Parsons, PA-C   REFERRING DIAG: M33.20 (ICD-10-CM) -  Polymyositis, organ involvement unspecified   THERAPY DIAG:  Polymyositis ([place])  Muscle weakness (generalized)  Unsteadiness on feet  Difficulty in walking, not elsewhere classified  Repeated falls  Other abnormalities of gait and mobility  Other lack of coordination  Rationale for Evaluation and Treatment Rehabilitation  ONSET DATE: 02/15/2022   SUBJECTIVE:   SUBJECTIVE STATEMENT: Feeling pretty good, took meds this morning and is feeling hot and sweaty.   PERTINENT HISTORY: [name] is a 66 y.o. Gutierrez-handed male with history of type 2 diabetes mellitus hypertension intolerance of statin due to abnormal LFTs (off for the past years), CAD status post DES to RCA 12/28/2021 (addition of arterial for statin 20 mg, aspirin/Brilinta),   PAIN:  Are you having pain? No  PRECAUTIONS: None  WEIGHT BEARING RESTRICTIONS No  FALLS:  Has patient fallen in last 6 months? Yes. Number of falls 5  LIVING ENVIRONMENT: Lives with: lives with their family and lives alone Lives in: House/apartment Stairs: No Has following equipment at home: Gilford Rile - 4 wheeled, shower chair, Grab bars, and None  OCCUPATION: Comptroller, office work, Government social research officer.  PLOF: Independent  PATIENT GOALS Increase strength, return to his active lifestyle. 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 Travels to attend concerts.   OBJECTIVE:   MUSCLE LENGTH: Hamstrings: Gutierrez 62 deg; Left 68 deg   POSTURE: rounded shoulders   LOWER EXTREMITY ROM: WFL throughout, R hip with mild     tightness.   LOWER EXTREMITY MMT:  MMT Gutierrez eval Left eval  Hip flexion 4- 3+  Hip extension 4- 3+  Hip abduction 4- 3+  Hip adduction    Hip internal rotation    Hip external rotation    Knee flexion 4 4-  Knee extension 4 4-  Ankle dorsiflexion 4 4  Ankle plantarflexion    Ankle inversion    Ankle eversion     (Blank rows = not tested)   FUNCTIONAL TESTS:    5x  STS18.22 BERG 43/56   TODAY'S TREATMENT: 03/10/22 Nustep L5, 49mns  Shoulder flexion w/dowel 2x10  Bicep curls 4# 2x10 Walking without cane 3 laps CGA Step ups on foam CGA, 2 LOB  Standing on foam playing catch minA  2 way hip ext/abd 2x10   03/08/22 Nustep 814ms STS elevated table with yellow ball x10 then w/red ball OHPx6 Standing playing catch Cybex knee flexion 2x10  15# Cybex knee ext 2x10 15# Lateral and forward step ups on 4" 20reps Walking with cane outside 1 small lap and on grass  Walking without cane 2 laps, minA   03/03/22 Nustep L5, 79m42m Step ups 4" x10, 6" x10 Half tandem 30s Full tandem   R 9, 12, 30  L 5,5, 30 Walking 4 lap no breaks with cane STS from elevated table 2x10 Supine SLR 2x10 Bridges 3x10 Calf raises 3x10    PATIENT EDUCATION:  Education details: POC Person educated: Patient Education method: Explanation, [employer], and Verbal cues Education comprehension: verbalized understanding, returned demonstration, verbal cues required, and tactile cues required    Patent educated about keeping lights on during the night for  safety and to prevent future falls. Also educated about collection of HEP, and to not do all exercises but rather pick a few to do in order to avoid fatigue.  HOME EXERCISE PROGRAM: [id number]  ASSESSMENT:  CLINICAL IMPRESSION: Today's session continued general LE strengthening and added in some UE range of motion and strengthen will not getting OT this week. He is able to get dowel overhead with no weight but still has difficulty with it and has to bend elbows to push over head. States it is has gotten better and a few weeks ago he could not lift above head at all. He is able to demonstrate stable gait without a cane and walk 3 laps with minimal LOB.   OBJECTIVE IMPAIRMENTS Abnormal gait, decreased activity tolerance, decreased balance, decreased coordination, decreased endurance, decreased mobility, difficulty walking,  decreased strength, improper body mechanics, and postural dysfunction.   ACTIVITY LIMITATIONS carrying, lifting, bending, standing, squatting, stairs, transfers, bed mobility, and locomotion level  PARTICIPATION LIMITATIONS: meal prep, cleaning, laundry, driving, shopping, community activity, occupation, and yard work  PERSONAL FACTORS Past/current experiences are also affecting patient's functional outcome.   REHAB POTENTIAL: Good  CLINICAL DECISION MAKING: Evolving/moderate complexity  EVALUATION COMPLEXITY: Moderate   GOALS: Goals reviewed with patient? Yes  SHORT TERM GOALS: Target date: 03/17/2022   I with basic HEP Baseline: Goal status: INITIAL    LONG TERM GOALS: Target date: 05/12/2022   I with final HEP Baseline:  Goal status: INITIAL  2.  Patient will score at least 50/56 on BERG Balance Exam Baseline: 43 Goal status: INITIAL  3.  Paitent will complete 5x sit to stand in < 12 sec to demonstrate improved strength in BLE. Baseline:  Goal status: INITIAL  4.  Patient will ambulate at least 1000' with LRAD, MI, on level and unlevel surfaces Baseline:  Goal status: INITIAL  5.  Patient will be able to manage cooking and cleaning activities in his home without falling and without requiring a rest break. Baseline:  Goal status: INITIAL   PLAN: PT FREQUENCY: 2x/week  PT DURATION: 12 weeks  PLANNED INTERVENTIONS: Therapeutic exercises, Therapeutic activity, Neuromuscular re-education, Balance training, Gait training, Patient/Family education, Joint mobilization, Stair training, and Manual therapy  PLAN FOR NEXT SESSION: gait and balance activities, LE strengthening progress as tolerated   Andris Baumann, DPT 03/10/2022, 10:15 AM

## 2022-03-10 NOTE — Telephone Encounter (Signed)
Referral for General Surgery sent to [facility] [phone]. 

## 2022-03-14 ENCOUNTER — Telehealth: Payer: Self-pay | Admitting: Neurology

## 2022-03-14 NOTE — Telephone Encounter (Signed)
Referral info sent to pt per request, also r/s ncv/emg from 7/19 (MD out).

## 2022-03-15 ENCOUNTER — Ambulatory Visit: Payer: Medicare Other | Admitting: Occupational Therapy

## 2022-03-15 ENCOUNTER — Encounter: Payer: Self-pay | Admitting: Occupational Therapy

## 2022-03-15 ENCOUNTER — Ambulatory Visit: Payer: Medicare Other

## 2022-03-15 DIAGNOSIS — M6281 Muscle weakness (generalized): Secondary | ICD-10-CM

## 2022-03-15 DIAGNOSIS — R296 Repeated falls: Secondary | ICD-10-CM | POA: Diagnosis not present

## 2022-03-15 DIAGNOSIS — R2681 Unsteadiness on feet: Secondary | ICD-10-CM

## 2022-03-15 DIAGNOSIS — R262 Difficulty in walking, not elsewhere classified: Secondary | ICD-10-CM | POA: Diagnosis not present

## 2022-03-15 DIAGNOSIS — M3320 Polymyositis, organ involvement unspecified: Secondary | ICD-10-CM | POA: Diagnosis not present

## 2022-03-15 DIAGNOSIS — M25511 Pain in right shoulder: Secondary | ICD-10-CM | POA: Diagnosis not present

## 2022-03-15 DIAGNOSIS — R2689 Other abnormalities of gait and mobility: Secondary | ICD-10-CM

## 2022-03-15 DIAGNOSIS — R278 Other lack of coordination: Secondary | ICD-10-CM | POA: Diagnosis not present

## 2022-03-15 DIAGNOSIS — M25512 Pain in left shoulder: Secondary | ICD-10-CM

## 2022-03-15 NOTE — Therapy (Unsigned)
OUTPATIENT OCCUPATIONAL THERAPY TREATMENT NOTE   Patient Name: [name] MRN: [id number] DOB:[birth year], 66 y.o., male Today's Date: 03/15/2022  PCP: Greig Right, MD REFERRING PROVIDER: Cathlyn Parsons, PA-C   END OF SESSION:   OT End of Session - 03/15/22 1021     Visit Number 5    Date for OT Re-Evaluation 05/12/22    Authorization Type BCBS Medicare    Authorization Time Period VL: MN    OT Start Time 1016    OT Stop Time 1100    OT Time Calculation (min) 44 min    Activity Tolerance Patient tolerated treatment well    Behavior During Therapy WFL for tasks assessed/performed            Past Medical History:  Diagnosis Date   Arthritis    Diabetes mellitus    fasting 120s   History of kidney stones    Hypertension    Peripheral neuropathy    left fifth finger, left toe   Pneumonia    hx of   Past Surgical History:  Procedure Laterality Date   APPENDECTOMY     CORONARY STENT INTERVENTION N/A 12/28/2021   Procedure: CORONARY STENT INTERVENTION;  Surgeon: Jettie Booze, MD;  Location: [facility];  Service: Cardiovascular;  Laterality: N/A;   CYSTOSCOPY W/ URETERAL STENT PLACEMENT Bilateral 03/17/2014   Procedure: CYSTOSCOPY WITH RIGHT STENT PULL  ;  Surgeon: Alexis Frock, MD;  Location: [facility];  Service: Urology;  Laterality: Bilateral;   CYSTOSCOPY WITH RETROGRADE PYELOGRAM, URETEROSCOPY AND STENT PLACEMENT Bilateral 02/01/2014   Procedure: CYSTOSCOPY WITH BILATERAL RETROGRADE CYSTOSCOPY, BILATERAL RETROGRADE, PYELOGRAM, RIGHT URETEROSCOPY, STONE BASKETRY,  AND BILATERAL STENT PLACEMENT;  Surgeon: Alexis Frock, MD;  Location: [facility];  Service: Urology;  Laterality: Bilateral;   CYSTOSCOPY WITH URETEROSCOPY AND STENT PLACEMENT Left 02/12/2021   Procedure: CYSTOSCOPY WITH LEFT URETEROSCOPY, BALLOON DILITATION, HOLMIUM LASER LITHOTRIPSY AND STENT PLACEMENT;  Surgeon: Lucas Mallow, MD;  Location: [facility];  Service: Urology;  Laterality:  Left;   HOLMIUM LASER APPLICATION Left 5/88/5027   Procedure: HOLMIUM LASER APPLICATION;  Surgeon: Alexis Frock, MD;  [facility];  Service: Urology;  Laterality: Left;   INTRAVASCULAR ULTRASOUND/IVUS N/A 12/28/2021   Procedure: Intravascular Ultrasound/IVUS;  Surgeon: Jettie Booze, MD;  Location: [facility];  Service: Cardiovascular;  Laterality: N/A;   LEFT HEART CATH AND CORONARY ANGIOGRAPHY N/A 12/28/2021   Procedure: LEFT HEART CATH AND CORONARY ANGIOGRAPHY;  Surgeon: Jettie Booze, MD;  Location: [facility];  Service: Cardiovascular;  Laterality: N/A;   LITHOTRIPSY     NEPHROLITHOTOMY Left 03/17/2014   Procedure: NEPHROLITHOTOMY PERCUTANEOUS with access;  Surgeon: Alexis Frock, MD;  [facility];  Service: Urology;  Laterality: Left;   NEPHROLITHOTOMY Left 03/19/2014   Procedure: 2ND STAGE NEPHROLITHOTOMY PERCUTANEOUS left ureteroscopy with lasr stone treatment and stent placement, with access;  Surgeon: Alexis Frock, MD;  Location: [facility];  Service: Urology;  Laterality: Left;   TOTAL KNEE ARTHROPLASTY Right 09/02/2014   Procedure: RIGHT TOTAL KNEE ARTHROPLASTY;  Surgeon: Meredith Pel, MD;  Location: [facility];  Service: Orthopedics;  Laterality: Right;   uretral stent     Patient Active Problem List   Diagnosis Date Noted   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   Gait abnormality 03/07/2022   Cerebrovascular accident (CVA) ([place]) 03/07/2022   Myalgia due to statin 02/28/2022   Mild aortic stenosis 02/28/2022   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   Elevated CK 02/02/2022  Elevated LFTs 02/02/2022   CAD (coronary artery disease) 02/02/2022   Muscle weakness of all 4 extremities 02/02/2022   Hyperlipidemia 01/28/2022   Arthritis of knee 09/02/2014   Staghorn kidney stones 03/17/2014   Nephrolithiasis 02/01/2014   Pneumonia 08/27/2011   Diabetes mellitus with coincident hypertension ([place]) 08/27/2011   ONSET DATE: 12/27/21  REFERRING DIAG: M33.20  (ICD-10-CM) - Polymyositis, organ involvement unspecified   THERAPY DIAG:  Muscle weakness (generalized)  Repeated falls  Other abnormalities of gait and mobility  Right shoulder pain, unspecified chronicity  Left shoulder pain, unspecified chronicity  Rationale for Evaluation and Treatment Rehabilitation  PERTINENT HISTORY: Suspected polymyositis; non-ST-elevation myocardial infarction (NSTEMI) s/p percutaneous coronary intervention (PCI) and drug-eluting stent (DES) placement 12/28/21; incidental small silent brainstem stroke (R pons infarct) identified per MRI; cervical spine neuroforaminal stenosis  Bilateral shoulder pain w/ R worse than L - partial to near full-thickness supraspinatus tear of R shoulder  Additional PMH: CAD, DMT2, HTN, dyslipidemia, nephrolithiasis, recent transaminitis, h/o stage I prostate cancer, and R TKA in 2016  PRECAUTIONS: Fall; gradual return to driving; don't lift over 10 lbs  SUBJECTIVE:  SUBJECTIVE STATEMENT: Pt reports he feels like he is doing better overall (e.g., was able to take the trash out for the first time), but has not noticed any significant difference in his shoulders or core  PAIN: Are you having pain? 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, chartered at the [employer]; travels for bands/festivals; yardwork  OBJECTIVE:   HAND DOMINANCE: Left  FUNCTIONAL OUTCOME MEASURES: Quick Dash: 59.1  - moderate functional impact Unable to do heavy household chores; wash back; perform recreational activities w/ force/impact through UE; work or perform other regular daily activities due to UE problem; UE problem has interfered quite a bit with normal social activities  UPPER EXTREMITY ROM     Active ROM Right Eval - 5/25 Left Eval - 5/25  Shoulder flexion 51 55  Shoulder abduction 50 58   Shoulder extension 40 47  Shoulder internal rotation 80 84  Shoulder external rotation 40 35  Elbow flexion 135 138  Elbow extension 41 6  (Blank rows = not tested)   TODAY'S TREATMENT - 03/15/22: Weighted towel slides (15# ankle weights on towel roll) completed 2x20 at standard tabletop height; OT graded up activity incrementally per pt request from 5 lbs to 15 lbs for increased challenge w/ pt able to complete w/out significant pain Attempted closed-chain shoulder flexion w/ and w/out weight while seated w/ back supported against back rest w/out significant success; OT graded activity to place-and-hold exercises for shoulder flex at slightly below chest height while holding 1# dowel; able to complete 5 reps w/out compensatory patterns (elbow flex, trunk ext, scapular elevation) Overhead press w/ shoulder extension back to starting position holding 1# dowel completed x5 w/ increased difficulty maintaining good alignment after increased reps Single-arm towel slides up doorframe completed 2x5 on each side w/ rest breaks between sets; OT provided education, verbal cues and tactile cues for good alignment throughout exercise   PATIENT EDUCATION: Continued condition-specific education, particularly regarding HEP and body mechanics Person educated: Patient Education method: Explanation and Handouts Education comprehension: verbalized understanding   HOME EXERCISE PROGRAM: MedBridge Code: [id number] - Supine Shoulder Flexion AAROM with Hands Clasped  - 2 x daily - 1 sets - 10 reps - 5 sec hold - Shoulder Flexion Wall Slide  with Towel  - 2 x daily - 7 x weekly - 2 sets - 5 reps - Seated Shoulder Flexion with Dowel to 90  - 2 x daily - 2-3 sets - 10-15 reps - Seated Shoulder Row with Anchored Resistance  - 2 x daily - 2-3 sets - 10-15 reps - Seated Shoulder External Rotation with Dumbbells  - 2 x daily - 2-3 sets - 10-15 reps - Seated Upper Trapezius Stretch  - 2 x daily - 7 x weekly - 1 sets -  25 reps - Gentle Levator Scapulae Stretch  - 2 x daily - 7 x weekly - 1 sets - 25 reps  GOALS: Goals reviewed with patient? Yes  SHORT TERM GOALS: Target date: 03/31/22  STG  Status:  1 Pt will verbalize understanding of at least 3 fall prevention and/or energy conservation strategies to increase safety w/ functional mobility at home Baseline: Decreased knowledge of strategies Progressing  2 Pt will demonstrate independence w/ recommended DME and/or AE (e.g., long-handle sponge, dressing stick, etc.) to improve participation and safety w/ ADLs Baseline: Decreased knowledge of AE Progressing  3 Pt will improve functional use of BUEs as evidence by being able to achieve at least 70 degrees of shoulder flexion w/out pain Baseline: RUE: 51*, LUE: 55* Progressing  4 Pt will improve functional bilateral reach by increasing R elbow extension to at least 25 degrees Baseline: 41* of flexion when attempting extension Progressing    LONG TERM GOALS: Target date: 05/12/22  LTG  Status:  1 Pt will demonstrate independence w/ full HEP designed for ROM and strength of BUEs Baseline: No HEP at this time Progressing  2 Pt will improve QuickDASH score by at least 19 points to indicate mild functional impairment Baseline: 59.1 (moderate, borderline severe) impairment) Progressing  3 Pt will improve functional use of BUEs as evidence by improving strength of BUE shoulder flexion to at least 3+/5  Baseline: To be assessed Progressing  4 With both hands, pt will be able to grip w/ at least 20 lbs of force to improve functional use of BUEs during IADLs Baseline: Unable to assess at eval due to medical restrictions Progressing  5 Pt will be able to retrieve lightweight object (< 5 lbs) from overhead height w/out pain by d/c Baseline: Unable to reach overhead Progressing    ASSESSMENT:  CLINICAL IMPRESSION: OT continued to address BUE strengthening, really focusing on targeted shoulder flexion, particularly  activation of anterior deltoid and pec major considering impact of decreased forward and overhead reach on functional activities. Limitations continue to appear mostly related to strength vs. pain or RTC limitations. ***.   PERFORMANCE DEFICITS in functional skills including ADLs, IADLs, ROM, strength, pain, GMC, mobility, balance, body mechanics, endurance, cardiopulmonary status limiting function, decreased knowledge of precautions, decreased knowledge of use of DME, and UE functional use.   IMPAIRMENTS are limiting patient from ADLs, IADLs, rest and sleep, work, leisure, and social participation.   COMORBIDITIES has co-morbidities such as NSTEMI, CAD, and DM  that affects occupational performance. Patient will benefit from skilled OT to address above impairments and improve overall function.   PLAN: OT FREQUENCY: 2x/week  OT DURATION: 10 weeks  PLANNED INTERVENTIONS: self care/ADL training, therapeutic exercise, therapeutic activity, manual therapy, passive range of motion, functional mobility training, aquatic therapy, moist heat, cryotherapy, patient/family education, energy conservation, DME and/or AE instructions, and Re-evaluation  RECOMMENDED OTHER SERVICES: Currently receiving PT services at this location  CONSULTED AND AGREED WITH PLAN OF CARE:  Patient  PLAN FOR NEXT SESSION: Reassess progress toward goals; focus on shoulder flexion and overhead reach bilaterally (R > L)   Kathrine Cords, MSOT, OTR/L 03/15/2022, 12:33 PM

## 2022-03-15 NOTE — Therapy (Signed)
OUTPATIENT PHYSICAL THERAPY LOWER EXTREMITY TREATMENT   Patient Name: CAGE GUPTON MRN: [id number] DOB:August 16, 1956, 66 y.o., male Today's Date: 03/15/2022   PT End of Session - 03/15/22 0924     Visit Number 7    Progress Note Due on Visit 10    PT Start Time 0925    PT Stop Time 1007    PT Time Calculation (min) 42 min    Equipment Utilized During Treatment Gait belt    Activity Tolerance Patient tolerated treatment well;Patient limited by fatigue    Behavior During Therapy WFL for tasks assessed/performed                  Past Medical History:  Diagnosis Date   Arthritis    Diabetes mellitus    fasting 120s   History of kidney stones    Hypertension    Peripheral neuropathy    left fifth finger, left toe   Pneumonia    hx of   Past Surgical History:  Procedure Laterality Date   APPENDECTOMY     CORONARY STENT INTERVENTION N/A 12/28/2021   Procedure: CORONARY STENT INTERVENTION;  Surgeon: Jettie Booze, MD;  Location: [facility];  Service: Cardiovascular;  Laterality: N/A;   CYSTOSCOPY W/ URETERAL STENT PLACEMENT Bilateral 03/17/2014   Procedure: CYSTOSCOPY WITH RIGHT STENT PULL  ;  Surgeon: Alexis Frock, MD;  [facility];  Service: Urology;  Laterality: Bilateral;   CYSTOSCOPY WITH RETROGRADE PYELOGRAM, URETEROSCOPY AND STENT PLACEMENT Bilateral 02/01/2014   Procedure: CYSTOSCOPY WITH BILATERAL RETROGRADE CYSTOSCOPY, BILATERAL RETROGRADE, PYELOGRAM, RIGHT URETEROSCOPY, STONE BASKETRY,  AND BILATERAL STENT PLACEMENT;  Surgeon: Alexis Frock, MD;  [facility];  Service: Urology;  Laterality: Bilateral;   CYSTOSCOPY WITH URETEROSCOPY AND STENT PLACEMENT Left 02/12/2021   Procedure: CYSTOSCOPY WITH LEFT URETEROSCOPY, BALLOON DILITATION, HOLMIUM LASER LITHOTRIPSY AND STENT PLACEMENT;  Surgeon: Lucas Mallow, MD;  [facility];  Service: Urology;  Laterality: Left;   HOLMIUM LASER APPLICATION Left 12/09/4006   Procedure: HOLMIUM LASER  APPLICATION;  Surgeon: Alexis Frock, MD;  [facility];  Service: Urology;  Laterality: Left;   INTRAVASCULAR ULTRASOUND/IVUS N/A 12/28/2021   Procedure: Intravascular Ultrasound/IVUS;  Surgeon: Jettie Booze, MD;  Location: [facility];  Service: Cardiovascular;  Laterality: N/A;   LEFT HEART CATH AND CORONARY ANGIOGRAPHY N/A 12/28/2021   Procedure: LEFT HEART CATH AND CORONARY ANGIOGRAPHY;  Surgeon: Jettie Booze, MD;  Location: [facility];  Service: Cardiovascular;  Laterality: N/A;   LITHOTRIPSY     NEPHROLITHOTOMY Left 03/17/2014   Procedure: NEPHROLITHOTOMY PERCUTANEOUS with access;  Surgeon: Alexis Frock, MD;  [facility];  Service: Urology;  Laterality: Left;   NEPHROLITHOTOMY Left 03/19/2014   Procedure: 2ND STAGE NEPHROLITHOTOMY PERCUTANEOUS left ureteroscopy with lasr stone treatment and stent placement, with access;  Surgeon: Alexis Frock, MD;  [facility];  Service: Urology;  Laterality: Left;   TOTAL KNEE ARTHROPLASTY Right 09/02/2014   Procedure: RIGHT TOTAL KNEE ARTHROPLASTY;  Surgeon: Meredith Pel, MD;  Location: [facility];  Service: Orthopedics;  Laterality: Right;   uretral stent     Patient Active Problem List   Diagnosis Date Noted   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   Gait abnormality 03/07/2022   Cerebrovascular accident (CVA) ([place]) 03/07/2022   Myalgia due to statin 02/28/2022   Mild aortic stenosis 02/28/2022   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   Elevated CK 02/02/2022   Elevated LFTs 02/02/2022   CAD (coronary artery disease) 02/02/2022  Muscle weakness of all 4 extremities 02/02/2022   Hyperlipidemia 01/28/2022   Arthritis of knee 09/02/2014   Staghorn kidney stones 03/17/2014   Nephrolithiasis 02/01/2014   Pneumonia 08/27/2011   Diabetes mellitus with coincident hypertension ([place]) 08/27/2011    PCP: Greig Right, MD  REFERRING PROVIDER: Cathlyn Parsons, PA-C   REFERRING DIAG: M33.20 (ICD-10-CM) -  Polymyositis, organ involvement unspecified   THERAPY DIAG:  Polymyositis ([place])  Muscle weakness (generalized)  Unsteadiness on feet  Other abnormalities of gait and mobility  Other lack of coordination  Rationale for Evaluation and Treatment Rehabilitation  ONSET DATE: 02/15/2022   SUBJECTIVE:   SUBJECTIVE STATEMENT:   Feeling pretty good, rolled out his trash cans on his steep driveway and it took a lot of his energy this morning. Has a lot of trouble reaching behind his head to fix collar this morning.   PERTINENT HISTORY: HAKAN NUDELMAN is a 66 y.o. right-handed male with history of type 2 diabetes mellitus hypertension intolerance of statin due to abnormal LFTs (off for the past years), CAD status post DES to RCA 12/28/2021 (addition of arterial for statin 20 mg, aspirin/Brilinta),   PAIN:  Are you having pain? No  PRECAUTIONS: None  WEIGHT BEARING RESTRICTIONS No  FALLS:  Has patient fallen in last 6 months? Yes. Number of falls 5  LIVING ENVIRONMENT: Lives with: lives with their family and lives alone Lives in: House/apartment Stairs: No Has following equipment at home: Gilford Rile - 4 wheeled, shower chair, Grab bars, and None  OCCUPATION: Comptroller, office work, Government social research officer.  PLOF: Independent  PATIENT GOALS Increase strength, return to his active lifestyle. 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 Travels to attend concerts.   OBJECTIVE:   MUSCLE LENGTH: Hamstrings: Right 62 deg; Left 68 deg   POSTURE: rounded shoulders   LOWER EXTREMITY ROM: WFL throughout, R hip with mild     tightness.   LOWER EXTREMITY MMT:  MMT Right eval Left eval  Hip flexion 4- 3+  Hip extension 4- 3+  Hip abduction 4- 3+  Hip adduction    Hip internal rotation    Hip external rotation    Knee flexion 4 4-  Knee extension 4 4-  Ankle dorsiflexion 4 4  Ankle plantarflexion    Ankle inversion    Ankle eversion     (Blank  rows = not tested)   FUNCTIONAL TESTS:    5x STS18.22 BERG 43/56   TODAY'S TREATMENT: 03/15/22 Nustep L5, 737mns UBE forwards/back 6 mins STS elevated table holding red ball 2x10 Crunches with black band 3x10 Step ups forwards and sideways 20reps 4" Standing on foam feet together catch Tandem on foam 30s    03/10/22 Nustep L5, 661ms  Shoulder flexion w/dowel 2x10  Bicep curls 4# 2x10 Walking without cane 3 laps CGA Step ups on foam CGA, 2 LOB  Standing on foam playing catch minA  2 way hip ext/abd 2x10   03/08/22 Nustep 37m61m STS elevated table with yellow ball x10 then w/red ball OHPx6 Standing playing catch Cybex knee flexion 2x10  15# Cybex knee ext 2x10 15# Lateral and forward step ups on 4" 20reps Walking with cane outside 1 small lap and on grass  Walking without cane 2 laps, minA   03/03/22 Nustep L5, 37mi45mStep ups 4" x10, 6" x10 Half tandem 30s Full tandem   R 9, 12, 30  L 5,5, 30 Walking 4 lap no breaks with cane STS from elevated table  2x10 Supine SLR 2x10 Bridges 3x10 Calf raises 3x10    PATIENT EDUCATION:  Education details: POC Person educated: Patient Education method: Explanation, Corporate treasurer cues, and Verbal cues Education comprehension: verbalized understanding, returned demonstration, verbal cues required, and tactile cues required    Patent educated about keeping lights on during the night for safety and to prevent future falls. Also educated about collection of HEP, and to not do all exercises but rather pick a few to do in order to avoid fatigue.  HOME EXERCISE PROGRAM: [id number]  ASSESSMENT:  CLINICAL IMPRESSION: Continued with general overall strengthening. Patient seems to be responding well to therapy and is getting stronger and walking more. He has a muscle biopsy scheduled for July 7th, nerve conduction study and electromyography next Wednesday. Patient asked if he was allowed to go to the gym with someone to work on some endurance  and do cardio and machines. He was educated and [facility] and do low level machines to be safe. Patient showed good progress with standing on foam. Ended session with warm up of UE before OT session.     OBJECTIVE IMPAIRMENTS Abnormal gait, decreased activity tolerance, decreased balance, decreased coordination, decreased endurance, decreased mobility, difficulty walking, decreased strength, improper body mechanics, and postural dysfunction.   ACTIVITY LIMITATIONS carrying, lifting, bending, standing, squatting, stairs, transfers, bed mobility, and locomotion level  PARTICIPATION LIMITATIONS: meal prep, cleaning, laundry, driving, shopping, community activity, occupation, and yard work  PERSONAL FACTORS Past/current experiences are also affecting patient's functional outcome.   REHAB POTENTIAL: Good  CLINICAL DECISION MAKING: Evolving/moderate complexity  EVALUATION COMPLEXITY: Moderate   GOALS: Goals reviewed with patient? Yes  SHORT TERM GOALS: Target date: 03/17/2022   I with basic HEP Baseline: Goal status: INITIAL    LONG TERM GOALS: Target date: 05/12/2022   I with final HEP Baseline:  Goal status: INITIAL  2.  Patient will score at least 50/56 on BERG Balance Exam Baseline: 43 Goal status: INITIAL  3.  Paitent will complete 5x sit to stand in < 12 sec to demonstrate improved strength in BLE. Baseline:  Goal status: INITIAL  4.  Patient will ambulate at least 1000' with LRAD, MI, on level and unlevel surfaces Baseline:  Goal status: INITIAL  5.  Patient will be able to manage cooking and cleaning activities in his home without falling and without requiring a rest break. Baseline:  Goal status: INITIAL   PLAN: PT FREQUENCY: 2x/week  PT DURATION: 12 weeks  PLANNED INTERVENTIONS: Therapeutic exercises, Therapeutic activity, Neuromuscular re-education, Balance training, Gait training, Patient/Family education, Joint  mobilization, Stair training, and Manual therapy  PLAN FOR NEXT SESSION: gait and balance activities, resisted gait, LE strengthening, progress as tolerated   Andris Baumann, DPT 03/15/2022, 10:10 AM

## 2022-03-17 ENCOUNTER — Ambulatory Visit: Payer: Medicare Other | Admitting: Occupational Therapy

## 2022-03-17 ENCOUNTER — Ambulatory Visit: Payer: Medicare Other

## 2022-03-17 VITALS — BP 116/77 | HR 95

## 2022-03-17 DIAGNOSIS — R262 Difficulty in walking, not elsewhere classified: Secondary | ICD-10-CM | POA: Diagnosis not present

## 2022-03-17 DIAGNOSIS — R278 Other lack of coordination: Secondary | ICD-10-CM

## 2022-03-17 DIAGNOSIS — R296 Repeated falls: Secondary | ICD-10-CM | POA: Diagnosis not present

## 2022-03-17 DIAGNOSIS — M3320 Polymyositis, organ involvement unspecified: Secondary | ICD-10-CM

## 2022-03-17 DIAGNOSIS — R2681 Unsteadiness on feet: Secondary | ICD-10-CM | POA: Diagnosis not present

## 2022-03-17 DIAGNOSIS — M6281 Muscle weakness (generalized): Secondary | ICD-10-CM

## 2022-03-17 DIAGNOSIS — M25512 Pain in left shoulder: Secondary | ICD-10-CM | POA: Diagnosis not present

## 2022-03-17 DIAGNOSIS — M25511 Pain in right shoulder: Secondary | ICD-10-CM | POA: Diagnosis not present

## 2022-03-17 DIAGNOSIS — R2689 Other abnormalities of gait and mobility: Secondary | ICD-10-CM

## 2022-03-17 LAB — THYROID PANEL WITH TSH
Free Thyroxine Index: 2.6 (ref 1.2–4.9)
T3 Uptake Ratio: 31 % (ref 24–39)
T4, Total: 8.4 ug/dL (ref 4.5–12.0)
TSH: 0.492 u[IU]/mL (ref 0.450–4.500)

## 2022-03-17 LAB — CK: Total CK: 2314 U/L (ref 41–331)

## 2022-03-17 LAB — MULTIPLE MYELOMA PANEL, SERUM
Albumin SerPl Elph-Mcnc: 3.6 g/dL (ref 2.9–4.4)
Albumin/Glob SerPl: 1.3 (ref 0.7–1.7)
Alpha 1: 0.2 g/dL (ref 0.0–0.4)
Alpha2 Glob SerPl Elph-Mcnc: 0.9 g/dL (ref 0.4–1.0)
B-Globulin SerPl Elph-Mcnc: 1 g/dL (ref 0.7–1.3)
Gamma Glob SerPl Elph-Mcnc: 0.7 g/dL (ref 0.4–1.8)
Globulin, Total: 2.8 g/dL (ref 2.2–3.9)
IgA/Immunoglobulin A, Serum: 136 mg/dL (ref 61–437)
IgG (Immunoglobin G), Serum: 668 mg/dL (ref 603–1613)
IgM (Immunoglobulin M), Srm: 70 mg/dL (ref 20–172)
Total Protein: 6.4 g/dL (ref 6.0–8.5)

## 2022-03-17 LAB — C-REACTIVE PROTEIN: CRP: 9 mg/L (ref 0–10)

## 2022-03-17 LAB — RPR: RPR Ser Ql: NONREACTIVE

## 2022-03-17 LAB — ACETYLCHOLINE RECEPTOR AB, ALL
AChR Binding Ab, Serum: <0.03 nmol/L (ref 0.00–0.24)
AChR-modulating Ab: 0 % (ref 0–45)
Acetylchol Block Ab: 23 % (ref 0–25)

## 2022-03-17 LAB — FOLATE: Folate: >20 ng/mL (ref >3.0)

## 2022-03-17 LAB — ANA W/REFLEX IF POSITIVE: Anti Nuclear Antibody (ANA): NEGATIVE

## 2022-03-17 LAB — COPPER, SERUM: Copper: 87 ug/dL (ref 69–132)

## 2022-03-17 LAB — VITAMIN B12: Vitamin B-12: 1034 pg/mL (ref 232–1245)

## 2022-03-17 LAB — SEDIMENTATION RATE: Sed Rate: 35 mm/hr — ABNORMAL HIGH (ref 0–30)

## 2022-03-17 LAB — VITAMIN D 25 HYDROXY (VIT D DEFICIENCY, FRACTURES): Vit D, 25-Hydroxy: 25 ng/mL — ABNORMAL LOW (ref 30.0–100.0)

## 2022-03-17 LAB — HGB A1C W/O EAG: Hgb A1c MFr Bld: 8.1 % — ABNORMAL HIGH (ref 4.8–5.6)

## 2022-03-17 NOTE — Therapy (Addendum)
OUTPATIENT PHYSICAL THERAPY LOWER EXTREMITY TREATMENT   Patient Name: [name] MRN: [id number] DOB:1956-02-13, 66 y.o., male Today's Date: 03/17/2022   PT End of Session - 03/17/22 0923     Visit Number 8    Progress Note Due on Visit 10    PT Start Time 0925    PT Stop Time 1010    PT Time Calculation (min) 45 min    Equipment Utilized During Treatment Gait belt    Activity Tolerance Patient tolerated treatment well;Patient limited by fatigue    Behavior During Therapy WFL for tasks assessed/performed                   Past Medical History:  Diagnosis Date   Arthritis    Diabetes mellitus    fasting 120s   History of kidney stones    Hypertension    Peripheral neuropathy    left fifth finger, left toe   Pneumonia    hx of   Past Surgical History:  Procedure Laterality Date   APPENDECTOMY     CORONARY STENT INTERVENTION N/A 12/28/2021   Procedure: CORONARY STENT INTERVENTION;  Surgeon: Jettie Booze, MD;  Location: [facility];  Service: Cardiovascular;  Laterality: N/A;   CYSTOSCOPY W/ URETERAL STENT PLACEMENT Bilateral 03/17/2014   Procedure: CYSTOSCOPY WITH RIGHT STENT PULL  ;  Surgeon: Alexis Frock, MD;  Location: [facility];  Service: Urology;  Laterality: Bilateral;   CYSTOSCOPY WITH RETROGRADE PYELOGRAM, URETEROSCOPY AND STENT PLACEMENT Bilateral 02/01/2014   Procedure: CYSTOSCOPY WITH BILATERAL RETROGRADE CYSTOSCOPY, BILATERAL RETROGRADE, PYELOGRAM, RIGHT URETEROSCOPY, STONE BASKETRY,  AND BILATERAL STENT PLACEMENT;  Surgeon: Alexis Frock, MD;  Location: [facility];  Service: Urology;  Laterality: Bilateral;   CYSTOSCOPY WITH URETEROSCOPY AND STENT PLACEMENT Left 02/12/2021   Procedure: CYSTOSCOPY WITH LEFT URETEROSCOPY, BALLOON DILITATION, HOLMIUM LASER LITHOTRIPSY AND STENT PLACEMENT;  Surgeon: Lucas Mallow, MD;  Location: [facility];  Service: Urology;  Laterality: Left;   HOLMIUM LASER APPLICATION Left 12/08/9456   Procedure: HOLMIUM LASER  APPLICATION;  Surgeon: Alexis Frock, MD;  Location: [facility];  Service: Urology;  Laterality: Left;   INTRAVASCULAR ULTRASOUND/IVUS N/A 12/28/2021   Procedure: Intravascular Ultrasound/IVUS;  Surgeon: Jettie Booze, MD;  Location: [facility];  Service: Cardiovascular;  Laterality: N/A;   LEFT HEART CATH AND CORONARY ANGIOGRAPHY N/A 12/28/2021   Procedure: LEFT HEART CATH AND CORONARY ANGIOGRAPHY;  Surgeon: Jettie Booze, MD;  Location: [facility];  Service: Cardiovascular;  Laterality: N/A;   LITHOTRIPSY     NEPHROLITHOTOMY Left 03/17/2014   Procedure: NEPHROLITHOTOMY PERCUTANEOUS with access;  Surgeon: Alexis Frock, MD;  [facility];  Service: Urology;  Laterality: Left;   NEPHROLITHOTOMY Left 03/19/2014   Procedure: 2ND STAGE NEPHROLITHOTOMY PERCUTANEOUS left ureteroscopy with lasr stone treatment and stent placement, with access;  Surgeon: Alexis Frock, MD;  Location: [facility];  Service: Urology;  Laterality: Left;   TOTAL KNEE ARTHROPLASTY Right 09/02/2014   Procedure: RIGHT TOTAL KNEE ARTHROPLASTY;  Surgeon: Meredith Pel, MD;  Location: [facility];  Service: Orthopedics;  Laterality: Right;   uretral stent     Patient Active Problem List   Diagnosis Date Noted   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   Gait abnormality 03/07/2022   Cerebrovascular accident (CVA) ([place]) 03/07/2022   Myalgia due to statin 02/28/2022   Mild aortic stenosis 02/28/2022   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   Elevated CK 02/02/2022   Elevated LFTs 02/02/2022   CAD (coronary artery disease)  02/02/2022   Muscle weakness of all 4 extremities 02/02/2022   Hyperlipidemia 01/28/2022   Arthritis of knee 09/02/2014   Staghorn kidney stones 03/17/2014   Nephrolithiasis 02/01/2014   Pneumonia 08/27/2011   Diabetes mellitus with coincident hypertension ([place]) 08/27/2011    PCP: Greig Right, MD  REFERRING PROVIDER: Cathlyn Parsons, PA-C   REFERRING DIAG: M33.20 (ICD-10-CM) -  Polymyositis, organ involvement unspecified   THERAPY DIAG:  Polymyositis (HCC)  Muscle weakness (generalized)  Other abnormalities of gait and mobility  Unsteadiness on feet  Difficulty in walking, not elsewhere classified  Other lack of coordination  Rationale for Evaluation and Treatment Rehabilitation  ONSET DATE: 02/15/2022   SUBJECTIVE:   SUBJECTIVE STATEMENT:  Not doing too bad but whole body feels tired today, it might be because of the rain. No pain just tired and no falls.    PERTINENT HISTORY: [name] is a 66 y.o. right-handed male with history of type 2 diabetes mellitus hypertension intolerance of statin due to abnormal LFTs (off for the past years), CAD status post DES to RCA 12/28/2021 (addition of arterial for statin 20 mg, aspirin/Brilinta),   PAIN:  Are you having pain? No  PRECAUTIONS: None  WEIGHT BEARING RESTRICTIONS No  FALLS:  Has patient fallen in last 6 months? Yes. Number of falls 5  LIVING ENVIRONMENT: Lives with: lives with their family and lives alone Lives in: House/apartment Stairs: No Has following equipment at home: Gilford Rile - 4 wheeled, shower chair, Grab bars, and None  OCCUPATION: Comptroller, office work, Government social research officer.  PLOF: Independent  PATIENT GOALS Increase strength, return to his active lifestyle. 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 Travels to attend concerts.   OBJECTIVE:   MUSCLE LENGTH: Hamstrings: Right 62 deg; Left 68 deg   POSTURE: rounded shoulders   LOWER EXTREMITY ROM: WFL throughout, R hip with mild     tightness.   LOWER EXTREMITY MMT:  MMT Right eval Left eval  Hip flexion 4- 3+  Hip extension 4- 3+  Hip abduction 4- 3+  Hip adduction    Hip internal rotation    Hip external rotation    Knee flexion 4 4-  Knee extension 4 4-  Ankle dorsiflexion 4 4  Ankle plantarflexion    Ankle inversion    Ankle eversion     (Blank rows = not  tested)   FUNCTIONAL TESTS:    5x STS18.22 BERG 43/56   TODAY'S TREATMENT: 03/17/22 Bike L4, 65mns Resisted gait 15# forwards and sideways x5 Hip abd and ext 3# 2x10 Side steps on foam Step overs 4" Walking without cane 3 laps    03/15/22 Nustep L5, 610ms UBE forwards/back 6 mins STS elevated table holding red ball 2x10 Crunches with black band 3x10 Step ups forwards and sideways 20reps 4" Standing on foam feet together catch Tandem on foam 30s    03/10/22 Nustep L5, 67m69m  Shoulder flexion w/dowel 2x10  Bicep curls 4# 2x10 Walking without cane 3 laps CGA Step ups on foam CGA, 2 LOB  Standing on foam playing catch minA  2 way hip ext/abd 2x10       PATIENT EDUCATION:  Education details: POC Person educated: Patient Education method: Explanation, [employer], and Verbal cues Education comprehension: verbalized understanding, returned demonstration, verbal cues required, and tactile cues required    Patent educated about keeping lights on during the night for safety and to prevent future falls. Also educated about collection of HEP, and to  not do all exercises but rather pick a few to do in order to avoid fatigue.  HOME EXERCISE PROGRAM: [id number]  ASSESSMENT:  CLINICAL IMPRESSION: Patient returns to therapy more tired today than usual. Continued with general LE functional strengthening. Attempted hip abd and ext on cables with 5# but patient cannot do without difficulty and compensating, completed with 3# ankle weights instead. Patient is able to complete side steps on foam without holding on for support but CGA for safety. He has difficulty with step overs on 4" step and requires 2HHA to help push back when getting fatigued. Second set done on 2" step and able to do w/1HHA but toes tend to catch on step with fatigue. Ended session walking without cane and patient shows good step length and speed but has soe LOB with turns. [place] [facility] [place]  [place].     OBJECTIVE IMPAIRMENTS Abnormal gait, decreased activity tolerance, decreased balance, decreased coordination, decreased endurance, decreased mobility, difficulty walking, decreased strength, improper body mechanics, and postural dysfunction.   ACTIVITY LIMITATIONS carrying, lifting, bending, standing, squatting, stairs, transfers, bed mobility, and locomotion level  PARTICIPATION LIMITATIONS: meal prep, cleaning, laundry, driving, shopping, community activity, occupation, and yard work  PERSONAL FACTORS Past/current experiences are also affecting patient's functional outcome.   REHAB POTENTIAL: Good  CLINICAL DECISION MAKING: Evolving/moderate complexity  EVALUATION COMPLEXITY: Moderate   GOALS: Goals reviewed with patient? Yes  SHORT TERM GOALS: Target date: 03/17/2022   I with basic HEP Baseline: Goal status: INITIAL    LONG TERM GOALS: Target date: 05/12/2022   I with final HEP Baseline:  Goal status: INITIAL  2.  Patient will score at least 50/56 on BERG Balance Exam Baseline: 43 Goal status: INITIAL  3.  Paitent will complete 5x sit to stand in < 12 sec to demonstrate improved strength in BLE. Baseline:  Goal status: INITIAL  4.  Patient will ambulate at least 1000' with LRAD, MI, on level and unlevel surfaces Baseline:  Goal status: INITIAL  5.  Patient will be able to manage cooking and cleaning activities in his home without falling and without requiring a rest break. Baseline:  Goal status: INITIAL   PLAN: PT FREQUENCY: 2x/week  PT DURATION: 12 weeks  PLANNED INTERVENTIONS: Therapeutic exercises, Therapeutic activity, Neuromuscular re-education, Balance training, Gait training, Patient/Family education, Joint mobilization, Stair training, and Manual therapy  PLAN FOR NEXT SESSION: gait and balance activities, resisted gait, LE strengthening, progress as tolerated   Andris Baumann, DPT 03/17/2022, 10:11 AM

## 2022-03-17 NOTE — Therapy (Incomplete)
OUTPATIENT OCCUPATIONAL THERAPY TREATMENT NOTE   Patient Name: [name] MRN: [id number] DOB:[birth year], 66 y.o., male Today's Date: 03/17/2022  PCP: Greig Right, MD REFERRING PROVIDER: Cathlyn Parsons, PA-C   END OF SESSION:   OT End of Session - 03/17/22 1023     Visit Number 6    Date for OT Re-Evaluation 05/12/22    Authorization Type BCBS Medicare    Authorization Time Period VL: [place]    OT Start Time 1015    OT Stop Time 1100    OT Time Calculation (min) 45 min    Activity Tolerance Patient tolerated treatment well    Behavior During Therapy WFL for tasks assessed/performed            Past Medical History:  Diagnosis Date   Arthritis    Diabetes mellitus    fasting 120s   History of kidney stones    Hypertension    Peripheral neuropathy    left fifth finger, left toe   Pneumonia    hx of   Past Surgical History:  Procedure Laterality Date   APPENDECTOMY     CORONARY STENT INTERVENTION N/A 12/28/2021   Procedure: CORONARY STENT INTERVENTION;  Surgeon: Jettie Booze, MD;  Location: [facility];  Service: Cardiovascular;  Laterality: N/A;   CYSTOSCOPY W/ URETERAL STENT PLACEMENT Bilateral 03/17/2014   Procedure: CYSTOSCOPY WITH RIGHT STENT PULL  ;  Surgeon: Alexis Frock, MD;  [facility];  Service: Urology;  Laterality: Bilateral;   CYSTOSCOPY WITH RETROGRADE PYELOGRAM, URETEROSCOPY AND STENT PLACEMENT Bilateral 02/01/2014   Procedure: CYSTOSCOPY WITH BILATERAL RETROGRADE CYSTOSCOPY, BILATERAL RETROGRADE, PYELOGRAM, RIGHT URETEROSCOPY, STONE BASKETRY,  AND BILATERAL STENT PLACEMENT;  Surgeon: Alexis Frock, MD;  [facility];  Service: Urology;  Laterality: Bilateral;   CYSTOSCOPY WITH URETEROSCOPY AND STENT PLACEMENT Left 02/12/2021   Procedure: CYSTOSCOPY WITH LEFT URETEROSCOPY, BALLOON DILITATION, HOLMIUM LASER LITHOTRIPSY AND STENT PLACEMENT;  Surgeon: Lucas Mallow, MD;  [facility];  Service: Urology;  Laterality:  Left;   HOLMIUM LASER APPLICATION Left 2/87/8676   Procedure: HOLMIUM LASER APPLICATION;  Surgeon: Alexis Frock, MD;  [facility];  Service: Urology;  Laterality: Left;   INTRAVASCULAR ULTRASOUND/IVUS N/A 12/28/2021   Procedure: Intravascular Ultrasound/IVUS;  Surgeon: Jettie Booze, MD;  Location: [facility];  Service: Cardiovascular;  Laterality: N/A;   LEFT HEART CATH AND CORONARY ANGIOGRAPHY N/A 12/28/2021   Procedure: LEFT HEART CATH AND CORONARY ANGIOGRAPHY;  Surgeon: Jettie Booze, MD;  Location: [facility];  Service: Cardiovascular;  Laterality: N/A;   LITHOTRIPSY     NEPHROLITHOTOMY Left 03/17/2014   Procedure: NEPHROLITHOTOMY PERCUTANEOUS with access;  Surgeon: Alexis Frock, MD;  [facility];  Service: Urology;  Laterality: Left;   NEPHROLITHOTOMY Left 03/19/2014   Procedure: 2ND STAGE NEPHROLITHOTOMY PERCUTANEOUS left ureteroscopy with lasr stone treatment and stent placement, with access;  Surgeon: Alexis Frock, MD;  [facility];  Service: Urology;  Laterality: Left;   TOTAL KNEE ARTHROPLASTY Right 09/02/2014   Procedure: RIGHT TOTAL KNEE ARTHROPLASTY;  Surgeon: Meredith Pel, MD;  Location: [facility];  Service: Orthopedics;  Laterality: Right;   uretral stent     Patient Active Problem List   Diagnosis Date Noted   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   Gait abnormality 03/07/2022   Cerebrovascular accident (CVA) ([place]) 03/07/2022   Myalgia due to statin 02/28/2022   Mild aortic stenosis 02/28/2022   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  Rationale for Evaluation and Treatment Rehabilitation  PERTINENT HISTORY: Suspected polymyositis; non-ST-elevation myocardial infarction (NSTEMI) s/p percutaneous coronary intervention (PCI) and drug-eluting stent (DES) placement 12/28/21; incidental small silent brainstem stroke (R pons infarct) identified per MRI; cervical spine neuroforaminal stenosis  Bilateral shoulder pain w/ R worse than L - partial to near full-thickness supraspinatus tear of R shoulder  Additional PMH: CAD, DMT2, HTN, dyslipidemia, nephrolithiasis, recent transaminitis, h/o stage I prostate cancer, and R TKA in 2016  PRECAUTIONS: Fall; gradual return to driving; don't lift over 10 lbs  SUBJECTIVE:  SUBJECTIVE STATEMENT: Pt reports he completed some work yesterday (about 4-5 hours) and felt "mentally tired" afterward  PAIN: Are you having pain? 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, chartered at the [employer]; travels for bands/festivals; yardwork  OBJECTIVE:   HAND DOMINANCE: Left  FUNCTIONAL OUTCOME MEASURES: Quick Dash: 59.1  - moderate functional impact Unable to do heavy household chores; wash back; perform recreational activities w/ force/impact through UE; work or perform other regular daily activities due to UE problem; UE problem has interfered quite a bit with normal social activities  UPPER EXTREMITY ROM     Active ROM Right Eval - 5/25 Left Eval - 5/25  Shoulder flexion 51 55  Shoulder abduction 50 58  Shoulder extension 40 47  Shoulder internal rotation 80 84  Shoulder external rotation 40 35  Elbow flexion 135 138  Elbow extension 41 6  (Blank rows = not tested)   TODAY'S TREATMENT - 03/17/22: UE Erogometer for 6 min Shoulder flexion  holding 55cm ball Modified sit-ups: 1x10 holding 2 kg medball; 3x10 against resistance band (yellow) Single arm shoulder ER x20 each side w/ 2# dumbbell Single arm shoulder extension against anchored resistance 2x10 each side while seated   PATIENT EDUCATION: Continued condition-specific education, particularly regarding HEP and body mechanics Person educated: Patient Education method: Explanation and Handouts Education comprehension: verbalized understanding   HOME EXERCISE PROGRAM: MedBridge Code: [id number] - Supine Shoulder Flexion AAROM with Hands Clasped  - 2 x daily - 1 sets - 10 reps - 5 sec hold - Shoulder Flexion Wall Slide with Towel  - 2 x daily - 7 x weekly - 2 sets - 5 reps - Seated Shoulder Flexion with Dowel to 90  - 2 x daily - 2-3 sets - 10-15 reps - Seated Shoulder Row with Anchored Resistance  - 2 x daily - 2-3 sets - 10-15 reps - Seated Shoulder External Rotation with Dumbbells  - 2 x daily - 2-3 sets - 10-15 reps - Seated Upper Trapezius Stretch  - 2 x daily - 7 x weekly - 1 sets - 25 reps - Gentle Levator Scapulae Stretch  - 2 x daily - 7 x weekly - 1 sets - 25 reps  GOALS: Goals reviewed with patient? Yes  SHORT TERM GOALS: Target date: 03/31/22  STG  Status:  1 Pt will verbalize understanding of at least  3 fall prevention and/or energy conservation strategies to increase safety w/ functional mobility at home Baseline: Decreased knowledge of strategies Progressing  2 Pt will demonstrate independence w/ recommended DME and/or AE (e.g., long-handle sponge, dressing stick, etc.) to improve participation and safety w/ ADLs Baseline: Decreased knowledge of AE Progressing  3 Pt will improve functional use of BUEs as evidence by being able to achieve at least 70 degrees of shoulder flexion w/out pain Baseline: RUE: 51*, LUE: 55* Progressing  4 Pt will improve functional bilateral reach by increasing R elbow extension to at least 25 degrees Baseline: 41* of flexion  when attempting extension Progressing    LONG TERM GOALS: Target date: 05/12/22  LTG  Status:  1 Pt will demonstrate independence w/ full HEP designed for ROM and strength of BUEs Baseline: No HEP at this time Progressing  2 Pt will improve QuickDASH score by at least 19 points to indicate mild functional impairment Baseline: 59.1 (moderate, borderline severe) impairment) Progressing  3 Pt will improve functional use of BUEs as evidence by improving strength of BUE shoulder flexion to at least 3+/5  Baseline: To be assessed Progressing  4 With both hands, pt will be able to grip w/ at least 20 lbs of force to improve functional use of BUEs during IADLs Baseline: Unable to assess at eval due to medical restrictions Progressing  5 Pt will be able to retrieve lightweight object (< 5 lbs) from overhead height w/out pain by d/c Baseline: Unable to reach overhead Progressing    ASSESSMENT:  CLINICAL IMPRESSION: OT continued to address BUE strengthening, really focusing on targeted shoulder flexion, particularly activation of anterior deltoid and pec major considering impact of decreased forward and overhead reach on functional activities. Limitations continue to appear mostly related to strength vs. pain or RTC limitations. ***.   PERFORMANCE DEFICITS in functional skills including ADLs, IADLs, ROM, strength, pain, GMC, mobility, balance, body mechanics, endurance, cardiopulmonary status limiting function, decreased knowledge of precautions, decreased knowledge of use of DME, and UE functional use.   IMPAIRMENTS are limiting patient from ADLs, IADLs, rest and sleep, work, leisure, and social participation.   COMORBIDITIES has co-morbidities such as NSTEMI, CAD, and DM  that affects occupational performance. Patient will benefit from skilled OT to address above impairments and improve overall function.   PLAN: OT FREQUENCY: 2x/week  OT DURATION: 10 weeks  PLANNED INTERVENTIONS: self  care/ADL training, therapeutic exercise, therapeutic activity, manual therapy, passive range of motion, functional mobility training, aquatic therapy, moist heat, cryotherapy, patient/family education, energy conservation, DME and/or AE instructions, and Re-evaluation  RECOMMENDED OTHER SERVICES: Currently receiving PT services at this location  CONSULTED AND AGREED WITH PLAN OF CARE: Patient  PLAN FOR NEXT SESSION: Reassess progress toward goals; focus on shoulder flexion and overhead reach bilaterally (R > L)   Kathrine Cords, MSOT, OTR/L 03/17/2022, 10:25 AM

## 2022-03-21 ENCOUNTER — Other Ambulatory Visit: Payer: Self-pay

## 2022-03-21 DIAGNOSIS — E1121 Type 2 diabetes mellitus with diabetic nephropathy: Secondary | ICD-10-CM | POA: Diagnosis not present

## 2022-03-21 MED ORDER — PREDNISONE 10 MG PO TABS: ORAL_TABLET | ORAL | 3 refills | Status: DC

## 2022-03-22 ENCOUNTER — Ambulatory Visit: Payer: Medicare Other

## 2022-03-22 ENCOUNTER — Encounter: Payer: Self-pay | Admitting: Occupational Therapy

## 2022-03-22 ENCOUNTER — Ambulatory Visit: Payer: Medicare Other | Admitting: Occupational Therapy

## 2022-03-22 DIAGNOSIS — R2681 Unsteadiness on feet: Secondary | ICD-10-CM | POA: Diagnosis not present

## 2022-03-22 DIAGNOSIS — M25512 Pain in left shoulder: Secondary | ICD-10-CM

## 2022-03-22 DIAGNOSIS — M6281 Muscle weakness (generalized): Secondary | ICD-10-CM

## 2022-03-22 DIAGNOSIS — M25511 Pain in right shoulder: Secondary | ICD-10-CM | POA: Diagnosis not present

## 2022-03-22 DIAGNOSIS — R262 Difficulty in walking, not elsewhere classified: Secondary | ICD-10-CM | POA: Diagnosis not present

## 2022-03-22 DIAGNOSIS — R278 Other lack of coordination: Secondary | ICD-10-CM

## 2022-03-22 DIAGNOSIS — M3320 Polymyositis, organ involvement unspecified: Secondary | ICD-10-CM

## 2022-03-22 DIAGNOSIS — R2689 Other abnormalities of gait and mobility: Secondary | ICD-10-CM

## 2022-03-22 DIAGNOSIS — R296 Repeated falls: Secondary | ICD-10-CM | POA: Diagnosis not present

## 2022-03-23 ENCOUNTER — Ambulatory Visit: Payer: Self-pay | Admitting: Neurology

## 2022-03-23 ENCOUNTER — Ambulatory Visit (INDEPENDENT_AMBULATORY_CARE_PROVIDER_SITE_OTHER): Payer: Medicare Other | Admitting: Neurology

## 2022-03-23 ENCOUNTER — Encounter: Payer: Self-pay | Admitting: Neurology

## 2022-03-23 VITALS — BP 152/66 | HR 83 | Ht 70.0 in | Wt 182.0 lb

## 2022-03-23 DIAGNOSIS — E1142 Type 2 diabetes mellitus with diabetic polyneuropathy: Secondary | ICD-10-CM | POA: Insufficient documentation

## 2022-03-23 DIAGNOSIS — R269 Unspecified abnormalities of gait and mobility: Secondary | ICD-10-CM

## 2022-03-23 DIAGNOSIS — I639 Cerebral infarction, unspecified: Secondary | ICD-10-CM

## 2022-03-23 DIAGNOSIS — G7249 Other inflammatory and immune myopathies, not elsewhere classified: Secondary | ICD-10-CM

## 2022-03-23 DIAGNOSIS — Z0289 Encounter for other administrative examinations: Secondary | ICD-10-CM

## 2022-03-23 NOTE — Progress Notes (Addendum)
Chief Complaint  Patient presents with   Procedure    Emg 3 Emg 3 Pt c/o both legs and both shoulders, worse on left side        ASSESSMENT AND PLAN  [name] is a 66 y.o. male   Inflammatory myopathy  Significantly elevated CPK up to 5400, some improvement with high-dose of steroid, started as 80 mg, 10 mg decrement every week, but with rebound of CPK level from 991 to 2314 please continued prednisone tapering  Is not a good candidate for long-term prednisone treatment, due to suboptimal control of diabetes, A1c was 8.1  Profound neck flexion extension, upper and lower extremity proximal muscle weakness  Methotrexate 2.5 mg 6 tablets weekly was started on March 07, 2022, tolerating it well,  Preauthorization for IVIG  Continue to taper down prednisone stay at 20 mg daily,  EMG nerve conduction study today confirmed typical inflammatory biopsy, in the background of length-dependent diabetic peripheral neuropathy, also evidence of left carpal tunnel syndromes, left ulnar neuropathy across left elbow  General surgeon and muscle biopsies pending on March 31, 2022.  Right pontine stroke Coronary artery disease  With mild residual left upper and lower extremity weakness, hyperreflexia on the left side,  Vascular risk factor of aging, hypertension, hyperlipidemia, diabetes  On aspirin and Plavix    DIAGNOSTIC DATA (LABS, IMAGING, TESTING) - I reviewed patient records, labs, notes, testing and imaging myself where available. Addendum, patient was seen by Dr. Beryle Lathe Cornett on April 01, 2022, for muscle biopsy is planned,  MEDICAL HISTORY:  [name] is a 66 year old male, seen in request by his primary care physician Dr. Greig Right, to follow-up for stroke, inflammatory myopathy, initial evaluation was with her daughter on March 07, 2022,  I reviewed and summarized the referring note. PMHx. HTN CAD, April 2023, was on brilinta DM since 2017  He has been  physically active, suffered heart attack in April 2023 while pushing mowing his lawn, was noted to have elevated troponin, underwent stent, double antiplatelet treatment aspirin and Brilinta  During that hospital admission, he was found to have elevated abnormal liver functional test, progressive weakness since the end of 2022, his atorvastatin 20 mg was on hold since then  Hospital admission on Feb 02, 2022 for weakness, to the point his father has to help him to get into the car, laboratory evaluation showed CPK 7291, AST 181, ALT  CT of the chest abdomen and pelvis with contrast negative for evidence of metastatic disease, extensive coronary artery calcification  He was seen by neurologist, with his presentation of symmetric bilateral upper and lower extremity weakness, consistent with inflammatory myositis  MRI of the brain showed acute infarction involving ventral pontine  CT angiogram of head and neck showed right ICA stenosis measuring at least 70% proximally, 70% at the paraclinoid segment, 50% left internal carotid artery stenosis, bilateral vertebral origin stenosis measuring 50% on the right 40% on the left  Laboratory evaluations in June 2023, CPK 991, CBC showed elevated WBC 21.7 hemoglobin 13.7, BMP creatinine 0.5,  HMGCR ig antibody was significantly elevated 97 units, which may be associated with necrotizing autoimmune myopathy in a subset of statin treated patients, although infrequent, this antibody may also be observed in statin nave patient with necrotizing autoimmune myopathy,  Normal magnesium, phosphate, elevated ALT 262, AST 65, A1c 7.4, LDL was 97.8, normal TSH, negative HIV, negative D-dimer, lipase,  MRI of cervical spine showed multilevel degenerative changes, no significant canal  stenosis, C3-4 severe left, C5-6 severe right, C6-7 severe left and moderate right neuroforaminal stenosis  He was treated with Solu-Medrol 1 g daily for 3 days followed by p.o. prednisone  tapering starting from 80 mg daily, decrement of 10 mg every 1 week, will be taking 50 mg March 08, 2022  Case was discussed with Dr. Saverio Danker of general surgeon, planning on having muscle biopsy, he was discharged to rehabilitation, follow-up visit did showed improvement of his muscle weakness, was able to ambulate, the response to steroid also favored the diagnosis of inflammatory myopathy, he was discharged to inpatient rehabilitation, discharged on Feb 16, 2022, he now lives alone at home, ambulated with a cane  Emergency presentation 3 days later Feb 19, 2022, fell, hit his left frontal region on the back,  CT head showed no acute abnormality  He was not on any antiplatelet agent prior to hospital admission, now taking aspirin and Plavix, he has some improvement, but far from his baseline, has difficulty raising arm overhead, could not put plate in the cabinet, gait abnormality,   PHYSICAL EXAM:   Vitals:   03/23/22 0829  BP: (!) 152/66  Pulse: 83  Weight: 182 lb (82.6 kg)  Height: '5\' 10"'$  (1.778 m)     Body mass index is 26.11 kg/m.  PHYSICAL EXAMNIATION:  Gen: NAD, conversant, well nourised, well groomed                     Cardiovascular: Regular rate rhythm, no peripheral edema, warm, nontender. Eyes: Conjunctivae clear without exudates or hemorrhage Neck: Supple, no carotid bruits. Pulmonary: Clear to auscultation bilaterally   NEUROLOGICAL EXAM:  MENTAL STATUS: Speech/cognition: Awake, alert, oriented to history taking and casual conversation CRANIAL NERVES: CN II: Visual fields are full to confrontation. Pupils are round equal and briskly reactive to light. CN III, IV, VI: extraocular movement are normal. No ptosis. CN V: Facial sensation is intact to light touch CN VII: Face is symmetric with normal eye closure  CN VIII: Hearing is normal to causal conversation. CN IX, X: Phonation is normal. CN XI: Head turning and shoulder shrug are intact  MOTOR:  Limited range of motion of right shoulder, due to right shoulder rotator cuff injury, mild to moderate neck flexion weakness  UE Shoulder Abduction Shoulder External Rotation Elbow Flexion Elbow  Extension Wrist Flexion Wrist Extension Grip  R '3 3 4 4 '$ 5- 5- 5  L '3 3 4 4 '$ 5- 5- 5   LE Hip Flexion Knee flexion Knee extension Ankle Dorsiflexion  R '3 4 4 '$ 5-  L '3 4 4 5     '$ REFLEXES: Reflexes are mildly hyperreflexia on left upper extremity, patella, bilateral Babinski sign   SENSORY: Intact to light touch, pinprick and vibratory sensation are intact in fingers and toes.  COORDINATION: There is no trunk or limb dysmetria noted.  GAIT/STANCE: Need push-up to get up from seated position, lordotic, wide-based, unsteady  REVIEW OF SYSTEMS:  Full 14 system review of systems performed and notable only for as above All other review of systems were negative.   ALLERGIES: Allergies  Allergen Reactions   Atorvastatin Calcium Other (See Comments)    Statin induced myopathy and transaminitis    HOME MEDICATIONS: Current Outpatient Medications  Medication Sig Dispense Refill   acetaminophen (TYLENOL) 325 MG tablet Take 1-2 tablets (325-650 mg total) by mouth every 4 (four) hours as needed for mild pain.     amLODipine (NORVASC) 10 MG tablet  Take 1 tablet (10 mg total) by mouth every morning. 30 tablet 0   aspirin 81 MG EC tablet Take 1 tablet (81 mg total) by mouth daily. Swallow whole. 90 tablet 3   carvedilol (COREG) 12.5 MG tablet Take 1 tablet (12.5 mg total) by mouth 2 (two) times daily with a meal. 60 tablet 11   cholecalciferol (VITAMIN D) 1000 units tablet Take 1 tablet (1,000 Units total) by mouth daily. 30 tablet 0   citalopram (CELEXA) 20 MG tablet Take 1 tablet (20 mg total) by mouth daily. 30 tablet 0   clopidogrel (PLAVIX) 75 MG tablet 1st dose take 300 mg then 75 mg daily 94 tablet 3   Cyanocobalamin (VITAMIN B-12) 2500 MCG SUBL Place 1 tablet (2,500 mcg total) under the  tongue daily. 30 tablet 0   cyclobenzaprine (FLEXERIL) 10 MG tablet Take 1 tablet (10 mg total) by mouth at bedtime. 30 tablet 0   diclofenac Sodium (VOLTAREN) 1 % GEL Apply 2 g topically 4 (four) times daily. 100 g 0   Evolocumab (REPATHA SURECLICK) 295 MG/ML SOAJ Inject 1 pen. into the skin every 14 (fourteen) days. 2 mL 11   insulin glargine (LANTUS) 100 UNIT/ML Solostar Pen Inject 12 Units into the skin 2 (two) times daily. 15 mL 11   insulin lispro (HUMALOG) 100 UNIT/ML KwikPen Inject 4 Units into the skin daily at 6 (six) AM.     lisinopril (ZESTRIL) 40 MG tablet Take 1 tablet (40 mg total) by mouth every morning. 30 tablet 0   metFORMIN (GLUCOPHAGE) 500 MG tablet Take 2 tablets (1,000 mg total) by mouth 2 (two) times daily with a meal. 60 tablet 0   methotrexate (RHEUMATREX) 2.5 MG tablet Take 6 tablets (15 mg total) by mouth once a week. Caution:Chemotherapy. Protect from light. 30 tablet 11   Multiple Vitamins-Minerals (MULTIVITAMINS THER. W/MINERALS) TABS Take 1 tablet by mouth daily.     nitroGLYCERIN (NITROSTAT) 0.4 MG SL tablet Place 1 tablet (0.4 mg total) under the tongue every 5 (five) minutes x 3 doses as needed for chest pain. 25 tablet 12   Omega-3 Fatty Acids (FISH OIL) 1200 MG CAPS Take 1,200 mg by mouth daily.     predniSONE (DELTASONE) 10 MG tablet Take 7 tablets (70 mg total) by mouth daily with breakfast for 6 days, THEN 6 tablets (60 mg total) daily with breakfast for 7 days, THEN 5 tablets (50 mg total) daily with breakfast for 7 days, THEN 4 tablets (40 mg total) daily with breakfast for 7 days, THEN 3 tablets (30 mg total) daily with breakfast for 7 days, THEN 2 tablets (20 mg total) daily with breakfast for 7 days, THEN 1 tablet (10 mg total) daily with breakfast. 212 tablet 0   predniSONE (DELTASONE) 10 MG tablet 4 tabs every morning for one week 3 tabs every morning for one week. Then keep 2 tabs every morning. 90 tablet 3   No current facility-administered  medications for this visit.    PAST MEDICAL HISTORY: Past Medical History:  Diagnosis Date   Arthritis    Diabetes mellitus    fasting 120s   History of kidney stones    Hypertension    Peripheral neuropathy    left fifth finger, left toe   Pneumonia    hx of    PAST SURGICAL HISTORY: Past Surgical History:  Procedure Laterality Date   APPENDECTOMY     CORONARY STENT INTERVENTION N/A 12/28/2021   Procedure: CORONARY STENT INTERVENTION;  Surgeon:  Jettie Booze, MD;  Location: [facility];  Service: Cardiovascular;  Laterality: N/A;   CYSTOSCOPY W/ URETERAL STENT PLACEMENT Bilateral 03/17/2014   Procedure: CYSTOSCOPY WITH RIGHT STENT PULL  ;  Surgeon: Alexis Frock, MD;  Location: [facility];  Service: Urology;  Laterality: Bilateral;   CYSTOSCOPY WITH RETROGRADE PYELOGRAM, URETEROSCOPY AND STENT PLACEMENT Bilateral 02/01/2014   Procedure: CYSTOSCOPY WITH BILATERAL RETROGRADE CYSTOSCOPY, BILATERAL RETROGRADE, PYELOGRAM, RIGHT URETEROSCOPY, STONE BASKETRY,  AND BILATERAL STENT PLACEMENT;  Surgeon: Alexis Frock, MD;  Location: [facility];  Service: Urology;  Laterality: Bilateral;   CYSTOSCOPY WITH URETEROSCOPY AND STENT PLACEMENT Left 02/12/2021   Procedure: CYSTOSCOPY WITH LEFT URETEROSCOPY, BALLOON DILITATION, HOLMIUM LASER LITHOTRIPSY AND STENT PLACEMENT;  Surgeon: Lucas Mallow, MD;  Location: [facility];  Service: Urology;  Laterality: Left;   HOLMIUM LASER APPLICATION Left 4/31/5400   Procedure: HOLMIUM LASER APPLICATION;  Surgeon: Alexis Frock, MD;  Location: [facility];  Service: Urology;  Laterality: Left;   INTRAVASCULAR ULTRASOUND/IVUS N/A 12/28/2021   Procedure: Intravascular Ultrasound/IVUS;  Surgeon: Jettie Booze, MD;  Location: [facility];  Service: Cardiovascular;  Laterality: N/A;   LEFT HEART CATH AND CORONARY ANGIOGRAPHY N/A 12/28/2021   Procedure: LEFT HEART CATH AND CORONARY ANGIOGRAPHY;  Surgeon: Jettie Booze, MD;  Location: [facility];  Service: Cardiovascular;  Laterality: N/A;   LITHOTRIPSY     NEPHROLITHOTOMY Left 03/17/2014   Procedure: NEPHROLITHOTOMY PERCUTANEOUS with access;  Surgeon: Alexis Frock, MD;  Location: [facility];  Service: Urology;  Laterality: Left;   NEPHROLITHOTOMY Left 03/19/2014   Procedure: 2ND STAGE NEPHROLITHOTOMY PERCUTANEOUS left ureteroscopy with lasr stone treatment and stent placement, with access;  Surgeon: Alexis Frock, MD;  Location: [facility];  Service: Urology;  Laterality: Left;   TOTAL KNEE ARTHROPLASTY Right 09/02/2014   Procedure: RIGHT TOTAL KNEE ARTHROPLASTY;  Surgeon: Meredith Pel, MD;  Location: [facility];  Service: Orthopedics;  Laterality: Right;   uretral stent      FAMILY HISTORY: Family History  Problem Relation Age of Onset   Alzheimer's disease Mother    Heart attack Father     SOCIAL HISTORY: Social History   Socioeconomic History   Marital status: Widowed    Spouse name: Not on file   Number of children: 2   Years of education: 3 yeae   Highest education level: Not on file  Occupational History   Not on file  Tobacco Use   Smoking status: Never   Smokeless tobacco: Never  Vaping Use   Vaping Use: Never used  Substance and Sexual Activity   Alcohol use: Yes    Alcohol/week: 2.0 standard drinks of alcohol    Types: 2 Cans of beer per week    Comment: occasional/ 1-2 beers week   Drug use: No   Sexual activity: Yes    Birth control/protection: Other-see comments  Other Topics Concern   Not on file  Social History Narrative   Not on file   Social Determinants of Health   Financial Resource Strain: Not on file  Food Insecurity: Not on file  Transportation Needs: Not on file  Physical Activity: Not on file  Stress: Not on file  Social Connections: Not on file  Intimate Partner Violence: Not on file      Marcial Pacas, M.D. Ph.D.  [facility] [address] [place],  [address] Ph: [phone] Fax:  [phone]  CC:  Greig Right, MD [address],  [place] [address]  Greig Right, MD

## 2022-03-24 ENCOUNTER — Ambulatory Visit: Payer: Medicare Other | Admitting: Occupational Therapy

## 2022-03-24 ENCOUNTER — Ambulatory Visit: Payer: Medicare Other

## 2022-03-24 DIAGNOSIS — R748 Abnormal levels of other serum enzymes: Secondary | ICD-10-CM | POA: Diagnosis not present

## 2022-03-24 NOTE — Procedures (Signed)
Full Name: [name] Gender: Male MRN #: [id number] Date of Birth: March 18, 1956    Visit Date: 03/23/2022 08:55 Age: 66 Years Examining Physician: Marcial Pacas Referring Physician: Marcial Pacas Height: 5 feet 10 inch History: 66 year old male with progressive proximal upper and lower extremity muscle weakness since October 2022, significantly elevated CPK level, long history of diabetes, lower extremity ascending paresthesia  Summary of the test: Nerve conduction study:  Left superficial peroneal sensory responses were absent.  Left median sensory response showed moderately prolonged peak latency with moderately decreased snap amplitude.  Left ulnar sensory response was absent.  Left radial sensory response was within normal limits  Left median motor response showed normal CMAP amplitude, moderately prolonged distal latency, with normal conduction velocity.  Left ulnar motor response showed mildly prolonged distal latency, moderately decreased CMAP amplitude, 22 m/s velocity slowing across left elbow  Left tibial motor responses were within normal limit.  Electromyography: Selected needle examinations were performed left upper, lower extremity muscles, left cervical  paraspinal muscles.  There is evidence of increased insertional activity, polyphasic small motor unit potential with early recruitment most noticeable at proximal upper and lower extremity muscles, such as left deltoid, biceps, triceps, iliopsoas, vastus lateralis muscles,  Needle examination of distal leg muscles such as left tibialis anterior, posterior, showed mild chronic neuropathic changes,  Needle examination of left cervical paraspinal muscles also showed typical myopathic changes, polyphasic small motor unit potential with early recruitment patterns.    Conclusion: This is an abnormal study.  There is electrodiagnostic evidence of inflammatory necrotic myopathy, in addition there is also evidence of  length-dependent mild sensorimotor peripheral neuropathy, with superimposed moderate left carpal tunnel syndrome, left ulnar neuropathy across the elbow.    ------------------------------- Physician Name, M.D. PhD  [facility] [address] [place], [place] [address] Tel: [phone] Fax: [phone]  Verbal informed consent was obtained from the patient, patient was informed of potential risk of procedure, including bruising, bleeding, hematoma formation, infection, muscle weakness, muscle pain, numbness, among others.        [place]    Nerve / Sites Muscle Latency Ref. Amplitude Ref. Rel Amp Segments Distance Velocity Ref. Area    ms ms mV mV %  cm m/s m/s mVms  L Median - APB     Wrist APB 5.3 ?4.4 4.6 ?4.0 100 Wrist - APB 7   23.2     Upper arm APB 10.1  4.6  100 Upper arm - Wrist 24 50 ?49 23.7  L Ulnar - ADM     Wrist ADM 3.6 ?3.3 3.1 ?6.0 100 Wrist - ADM 7   10.3     B.Elbow ADM 6.1  3.0  96.2 B.Elbow - Wrist 16 65 ?49 9.9     A.Elbow ADM 8.8  2.9  97.4 A.Elbow - B.Elbow 12 43 ?49 11.5  L Tibial - AH     Ankle AH 5.5 ?5.8 4.4 ?4.0 100 Ankle - AH 9   8.8     Pop fossa AH 16.3  3.9  88.8 Pop fossa - Ankle   ?41 10.8           SNC    Nerve / Sites Rec. Site Peak Lat Ref.  Amp Ref. Segments Distance    ms ms V V  cm  L Radial - Anatomical snuff box (Forearm)     Forearm Wrist 2.3 ?2.9 18 ?15 Forearm - Wrist 10  L Superficial peroneal -  Ankle     Lat leg Ankle NR ?4.4 NR ?6 Lat leg - Ankle 14  L Median - Orthodromic (Dig II, Mid palm)     Dig II Wrist 4.4 ?3.4 4 ?10 Dig II - Wrist 13  L Ulnar - Orthodromic, (Dig V, Mid palm)     Dig V Wrist NR ?3.1 NR ?5 Dig V - Wrist 10             F  Wave    Nerve F Lat Ref.   ms ms  L Ulnar - ADM 34.2 ?32.0  L Tibial - AH 56.6 ?56.0         EMG Summary Table    Spontaneous MUAP Recruitment  Muscle IA Fib PSW Fasc Other Amp Dur. Poly Pattern  L. Tibialis anterior Normal None None None _______ Normal  Normal Normal Reduced  L. Tibialis posterior Normal None None None _______ Normal Normal Normal Reduced  L. Gastrocnemius (Medial head) Normal None None None _______ Normal Normal Normal Reduced  L. Vastus lateralis Normal None None None _______ Normal Normal Normal Normal  L. Abductor digiti minimi (pedis) Normal None None None _______ Increased Increased 1+ Reduced  L. Abductor pollicis brevis Normal None None None _______ Increased Increased 1+ Reduced  L. Biceps brachii Increased 2+ None None _______ Decreased Decreased 2+ Early  L. Deltoid Increased 1+ None None _______ Decreased Decreased 2+ Reduced  L. Triceps brachii Increased 1+ None None _______ Decreased Increased 2+ Early  R. Cervical paraspinals Normal None None None _______ Normal Decreased 2+ Normal  L. Iliopsoas Increased None None None _______ Normal Decreased 2+ Early

## 2022-03-30 DIAGNOSIS — R748 Abnormal levels of other serum enzymes: Secondary | ICD-10-CM | POA: Diagnosis not present

## 2022-03-30 NOTE — Therapy (Incomplete)
OUTPATIENT PHYSICAL THERAPY LOWER EXTREMITY TREATMENT   Patient Name: [name] MRN: [id number] DOB:August 17, 1956, 66 y.o., male Today's Date: 03/31/2022   PT End of Session - 03/31/22 1147     Visit Number 10    Progress Note Due on Visit 10    PT Start Time 1147    PT Stop Time 1227    PT Time Calculation (min) 40 min    Equipment Utilized During Treatment Gait belt    Activity Tolerance Patient tolerated treatment well;Patient limited by fatigue    Behavior During Therapy [facility] for tasks assessed/performed           Progress Note Reporting Period 02/17/22 to 03/31/22  See note below for Objective Data and Assessment of Progress/Goals.     Past Medical History:  Diagnosis Date   Arthritis    Diabetes mellitus    fasting 120s   History of kidney stones    Hypertension    Peripheral neuropathy    left fifth finger, left toe   Pneumonia    hx of   Past Surgical History:  Procedure Laterality Date   APPENDECTOMY     CORONARY STENT INTERVENTION N/A 12/28/2021   Procedure: CORONARY STENT INTERVENTION;  Surgeon: Jettie Booze, MD;  Location: [place] [facility];  Service: Cardiovascular;  Laterality: N/A;   CYSTOSCOPY W/ URETERAL STENT PLACEMENT Bilateral 03/17/2014   Procedure: CYSTOSCOPY WITH RIGHT STENT PULL  ;  Surgeon: Alexis Frock, MD;  [facility];  Service: Urology;  Laterality: Bilateral;   CYSTOSCOPY WITH RETROGRADE PYELOGRAM, URETEROSCOPY AND STENT PLACEMENT Bilateral 02/01/2014   Procedure: CYSTOSCOPY WITH BILATERAL RETROGRADE CYSTOSCOPY, BILATERAL RETROGRADE, PYELOGRAM, RIGHT URETEROSCOPY, STONE BASKETRY,  AND BILATERAL STENT PLACEMENT;  Surgeon: Alexis Frock, MD;  [facility];  Service: Urology;  Laterality: Bilateral;   CYSTOSCOPY WITH URETEROSCOPY AND STENT PLACEMENT Left 02/12/2021   Procedure: CYSTOSCOPY WITH LEFT URETEROSCOPY, BALLOON DILITATION, HOLMIUM LASER LITHOTRIPSY AND STENT PLACEMENT;  Surgeon: Lucas Mallow, MD;  [facility];  Service: Urology;  Laterality: Left;   HOLMIUM LASER APPLICATION Left 05/27/5175   Procedure: HOLMIUM LASER APPLICATION;  Surgeon: Alexis Frock, MD;  [facility];  Service: Urology;  Laterality: Left;   INTRAVASCULAR ULTRASOUND/IVUS N/A 12/28/2021   Procedure: Intravascular Ultrasound/IVUS;  Surgeon: Jettie Booze, MD;  Location: [place] [facility];  Service: Cardiovascular;  Laterality: N/A;   LEFT HEART CATH AND CORONARY ANGIOGRAPHY N/A 12/28/2021   Procedure: LEFT HEART CATH AND CORONARY ANGIOGRAPHY;  Surgeon: Jettie Booze, MD;  Location: [place] [facility];  Service: Cardiovascular;  Laterality: N/A;   LITHOTRIPSY     NEPHROLITHOTOMY Left 03/17/2014   Procedure: NEPHROLITHOTOMY PERCUTANEOUS with access;  Surgeon: Alexis Frock, MD;  [facility];  Service: Urology;  Laterality: Left;   NEPHROLITHOTOMY Left 03/19/2014   Procedure: 2ND STAGE NEPHROLITHOTOMY PERCUTANEOUS left ureteroscopy with lasr stone treatment and stent placement, with access;  Surgeon: Alexis Frock, MD;  [facility];  Service: Urology;  Laterality: Left;   TOTAL KNEE ARTHROPLASTY Right 09/02/2014   Procedure: RIGHT TOTAL KNEE ARTHROPLASTY;  Surgeon: Meredith Pel, MD;  Location: [place];  Service: Orthopedics;  Laterality: Right;   uretral stent     Patient Active Problem List   Diagnosis Date Noted   Diabetic peripheral neuropathy associated with type 2 diabetes mellitus ([place]) 03/23/2022   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   Gait abnormality 03/07/2022   Cerebrovascular accident (CVA) ([place]) 03/07/2022   Myalgia due to statin 02/28/2022  Mild aortic stenosis 02/28/2022   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   Elevated CK 02/02/2022   Elevated LFTs 02/02/2022   CAD (coronary artery disease) 02/02/2022   Muscle weakness of all 4 extremities 02/02/2022   Hyperlipidemia 01/28/2022   Arthritis of knee 09/02/2014   Staghorn kidney stones 03/17/2014   Nephrolithiasis 02/01/2014   Pneumonia  08/27/2011   Diabetes mellitus with coincident hypertension ([place]) 08/27/2011    PCP: Greig Right, MD  REFERRING PROVIDER: Cathlyn Parsons, PA-C   REFERRING DIAG: M33.20 (ICD-10-CM) - Polymyositis, organ involvement unspecified   THERAPY DIAG:  Muscle weakness (generalized)  Other abnormalities of gait and mobility  Unsteadiness on feet  Difficulty in walking, not elsewhere classified  Other lack of coordination  Repeated falls  Polymyositis ([place])  Rationale for Evaluation and Treatment Rehabilitation  ONSET DATE: 02/15/2022   SUBJECTIVE:   SUBJECTIVE STATEMENT:  "No pain, couple of near misses but no falls"   PERTINENT HISTORY: [name] is a 66 y.o. right-handed male with history of type 2 diabetes mellitus hypertension intolerance of statin due to abnormal LFTs (off for the past years), CAD status post DES to RCA 12/28/2021 (addition of arterial for statin 20 mg, aspirin/Brilinta),   PAIN:  Are you having pain? No  PRECAUTIONS: None  WEIGHT BEARING RESTRICTIONS No  FALLS:  Has patient fallen in last 6 months? Yes. Number of falls 5  LIVING ENVIRONMENT: Lives with: lives with their family and lives alone Lives in: House/apartment Stairs: No Has following equipment at home: Gilford Rile - 4 wheeled, shower chair, Grab bars, and None  OCCUPATION: Comptroller, office work, Government social research officer.  PLOF: Independent  PATIENT GOALS Increase strength, return to his active lifestyle. 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 Travels to attend concerts.   OBJECTIVE:   MUSCLE LENGTH: Hamstrings: Right 62 deg; Left 68 deg   POSTURE: rounded shoulders   LOWER EXTREMITY ROM: WFL throughout, R hip with mild     tightness.   LOWER EXTREMITY MMT:  MMT Right eval Left eval Right 03/31/22 Left 03/31/22  Hip flexion 4- 3+ 4- 4-  Hip extension 4- 3+ 4- 3+  Hip abduction 4- 3+ 4- 3+  Hip adduction      Hip internal rotation       Hip external rotation      Knee flexion 4 4- 4 4  Knee extension 4 4- 4 4  Ankle dorsiflexion 4 4    Ankle plantarflexion      Ankle inversion      Ankle eversion       (Blank rows = not tested)   FUNCTIONAL TESTS:    5x STS18.22 BERG 43/56   TODAY'S TREATMENT: 03/30/22 Nustep L5 x27mins 5xSTS (unable to complete 5, stopped at 4 in 20s)  BERG (44/56) Walking 10 laps w/o cane  Cybex flex 15# 2x15 Cybex ext 20# 2x12  03/22/22 Nustep L4 x62mins STS on Airex 2x10 Walking outside on grass Walking down and up hill  Cybex knee flex 25# 2x10 Cybex knee ext 25# 2x10  03/17/22 Bike L4, 66mins Resisted gait 15# forwards and sideways x5 Hip abd and ext 3# 2x10 Side steps on foam Step overs 4" Walking without cane 3 laps   03/15/22 Nustep L5, 19mins UBE forwards/back 6 mins STS elevated table holding red ball 2x10 Crunches with black band 3x10 Step ups forwards and sideways 20reps 4" Standing on foam feet together catch Tandem on foam 30s    03/10/22 Nustep L5,  5mins  Shoulder flexion w/dowel 2x10  Bicep curls 4# 2x10 Walking without cane 3 laps CGA Step ups on foam CGA, 2 LOB  Standing on foam playing catch minA  2 way hip ext/abd 2x10       PATIENT EDUCATION:  Education details: POC Person educated: Patient Education method: Explanation, Corporate treasurer cues, and Verbal cues Education comprehension: verbalized understanding, returned demonstration, verbal cues required, and tactile cues required    Patent educated about keeping lights on during the night for safety and to prevent future falls. Also educated about collection of HEP, and to not do all exercises but rather pick a few to do in order to avoid fatigue.  HOME EXERCISE PROGRAM: [id number]  ASSESSMENT:  CLINICAL IMPRESSION: We completed a progress note today. Patient had a busy last few days and feels weaker when reassessing goals today. He states he is able to do more work around the house and is walking  without his cane. He was unable to complete 5xSTS today and only could do 4reps before his legs giving out. He was able to walk 1000' in the clinic without AD, required CGA when he started to fatigue. Updated goals to be in progress for all of them.   OBJECTIVE IMPAIRMENTS Abnormal gait, decreased activity tolerance, decreased balance, decreased coordination, decreased endurance, decreased mobility, difficulty walking, decreased strength, improper body mechanics, and postural dysfunction.   ACTIVITY LIMITATIONS carrying, lifting, bending, standing, squatting, stairs, transfers, bed mobility, and locomotion level  PARTICIPATION LIMITATIONS: meal prep, cleaning, laundry, driving, shopping, community activity, occupation, and yard work  PERSONAL FACTORS Past/current experiences are also affecting patient's functional outcome.   REHAB POTENTIAL: Good  CLINICAL DECISION MAKING: Evolving/moderate complexity  EVALUATION COMPLEXITY: Moderate   GOALS: Goals reviewed with patient? Yes  SHORT TERM GOALS: Target date: 03/17/2022   I with basic HEP Baseline: Goal status: MET    LONG TERM GOALS: Target date: 05/12/2022   I with final HEP Baseline:  Goal status: INITIAL  2.  Patient will score at least 50/56 on BERG Balance Exam Baseline: 43, 76/23 (44) Goal status: IN PROGRESS  3.  Paitent will complete 5x sit to stand in < 12 sec to demonstrate improved strength in BLE. Baseline:  Goal status: IN PROGRESS  4.  Patient will ambulate at least 1000' with LRAD, MI, on level and unlevel surfaces Baseline: 03/31/22 able to walk 1000' w/o AD on even surfaces, CGA due to LOB with fatigue Goal status: IN PROGRESS  5.  Patient will be able to manage cooking and cleaning activities in his home without falling and without requiring a rest break. Baseline:  Goal status: IN PROGRESS   PLAN: PT FREQUENCY: 2x/week  PT DURATION: 12 weeks  PLANNED INTERVENTIONS: Therapeutic exercises,  Therapeutic activity, Neuromuscular re-education, Balance training, Gait training, Patient/Family education, Joint mobilization, Stair training, and Manual therapy  PLAN FOR NEXT SESSION: gait and balance activities,    Andris Baumann, DPT 03/31/2022, 12:31 PM

## 2022-03-31 ENCOUNTER — Ambulatory Visit: Payer: Medicare Other | Admitting: Occupational Therapy

## 2022-03-31 ENCOUNTER — Ambulatory Visit: Payer: Medicare Other | Attending: Physician Assistant

## 2022-03-31 ENCOUNTER — Telehealth: Payer: Self-pay

## 2022-03-31 DIAGNOSIS — R278 Other lack of coordination: Secondary | ICD-10-CM

## 2022-03-31 DIAGNOSIS — R296 Repeated falls: Secondary | ICD-10-CM | POA: Diagnosis not present

## 2022-03-31 DIAGNOSIS — M25512 Pain in left shoulder: Secondary | ICD-10-CM

## 2022-03-31 DIAGNOSIS — M25511 Pain in right shoulder: Secondary | ICD-10-CM | POA: Diagnosis not present

## 2022-03-31 DIAGNOSIS — R2681 Unsteadiness on feet: Secondary | ICD-10-CM

## 2022-03-31 DIAGNOSIS — R293 Abnormal posture: Secondary | ICD-10-CM | POA: Insufficient documentation

## 2022-03-31 DIAGNOSIS — M6281 Muscle weakness (generalized): Secondary | ICD-10-CM

## 2022-03-31 DIAGNOSIS — R262 Difficulty in walking, not elsewhere classified: Secondary | ICD-10-CM

## 2022-03-31 DIAGNOSIS — M3320 Polymyositis, organ involvement unspecified: Secondary | ICD-10-CM | POA: Diagnosis not present

## 2022-03-31 DIAGNOSIS — R2689 Other abnormalities of gait and mobility: Secondary | ICD-10-CM | POA: Diagnosis not present

## 2022-03-31 NOTE — Telephone Encounter (Signed)
Refill request for Vit D3. Sent in refill request as patient has appt on 04/08/22. One time courtesy

## 2022-03-31 NOTE — Therapy (Signed)
OUTPATIENT OCCUPATIONAL THERAPY TREATMENT NOTE   Patient Name: [name] MRN: [id number] DOB:August 20, 1956, 66 y.o., male Today's Date: 03/31/2022  PCP: Greig Right, MD REFERRING PROVIDER: Cathlyn Parsons, PA-C   END OF SESSION:     OT End of Session - 03/31/22 1332       Visit Number 8    Number of Visits 21     Date for OT Re-Evaluation 05/12/22     Authorization Type BCBS Medicare     Authorization Time Period VL: [place]     OT Start Time 1000    OT Stop Time 1145     OT Time Calculation (min) 45 min     Activity Tolerance Patient tolerated treatment well     Behavior During Therapy WFL for tasks assessed/performed        Past Medical History:  Diagnosis Date   Arthritis    Diabetes mellitus    fasting 120s   History of kidney stones    Hypertension    Peripheral neuropathy    left fifth finger, left toe   Pneumonia    hx of   Past Surgical History:  Procedure Laterality Date   APPENDECTOMY     CORONARY STENT INTERVENTION N/A 12/28/2021   Procedure: CORONARY STENT INTERVENTION;  Surgeon: Jettie Booze, MD;  Location: [place] [facility];  Service: Cardiovascular;  Laterality: N/A;   CYSTOSCOPY W/ URETERAL STENT PLACEMENT Bilateral 03/17/2014   Procedure: CYSTOSCOPY WITH RIGHT STENT PULL  ;  Surgeon: Alexis Frock, MD;  [facility];  Service: Urology;  Laterality: Bilateral;   CYSTOSCOPY WITH RETROGRADE PYELOGRAM, URETEROSCOPY AND STENT PLACEMENT Bilateral 02/01/2014   Procedure: CYSTOSCOPY WITH BILATERAL RETROGRADE CYSTOSCOPY, BILATERAL RETROGRADE, PYELOGRAM, RIGHT URETEROSCOPY, STONE BASKETRY,  AND BILATERAL STENT PLACEMENT;  Surgeon: Alexis Frock, MD;  [facility];  Service: Urology;  Laterality: Bilateral;   CYSTOSCOPY WITH URETEROSCOPY AND STENT PLACEMENT Left 02/12/2021   Procedure: CYSTOSCOPY WITH LEFT URETEROSCOPY, BALLOON DILITATION, HOLMIUM LASER LITHOTRIPSY AND STENT PLACEMENT;  Surgeon: Lucas Mallow, MD;  [facility];   Service: Urology;  Laterality: Left;   HOLMIUM LASER APPLICATION Left 7/40/8144   Procedure: HOLMIUM LASER APPLICATION;  Surgeon: Alexis Frock, MD;  [facility];  Service: Urology;  Laterality: Left;   INTRAVASCULAR ULTRASOUND/IVUS N/A 12/28/2021   Procedure: Intravascular Ultrasound/IVUS;  Surgeon: Jettie Booze, MD;  Location: [place] [facility];  Service: Cardiovascular;  Laterality: N/A;   LEFT HEART CATH AND CORONARY ANGIOGRAPHY N/A 12/28/2021   Procedure: LEFT HEART CATH AND CORONARY ANGIOGRAPHY;  Surgeon: Jettie Booze, MD;  Location: [place] [facility];  Service: Cardiovascular;  Laterality: N/A;   LITHOTRIPSY     NEPHROLITHOTOMY Left 03/17/2014   Procedure: NEPHROLITHOTOMY PERCUTANEOUS with access;  Surgeon: Alexis Frock, MD;  [facility];  Service: Urology;  Laterality: Left;   NEPHROLITHOTOMY Left 03/19/2014   Procedure: 2ND STAGE NEPHROLITHOTOMY PERCUTANEOUS left ureteroscopy with lasr stone treatment and stent placement, with access;  Surgeon: Alexis Frock, MD;  [facility];  Service: Urology;  Laterality: Left;   TOTAL KNEE ARTHROPLASTY Right 09/02/2014   Procedure: RIGHT TOTAL KNEE ARTHROPLASTY;  Surgeon: Meredith Pel, MD;  Location: [place];  Service: Orthopedics;  Laterality: Right;   uretral stent     Patient Active Problem List   Diagnosis Date Noted   Diabetic peripheral neuropathy associated with type 2 diabetes mellitus ([place]) 03/23/2022   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   Gait abnormality 03/07/2022   Cerebrovascular accident (  CVA) ([place]) 03/07/2022   Myalgia due to statin 02/28/2022   Mild aortic stenosis 02/28/2022   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   Elevated CK 02/02/2022   Elevated LFTs 02/02/2022   CAD (coronary artery disease) 02/02/2022   Muscle weakness of all 4 extremities 02/02/2022   Hyperlipidemia 01/28/2022   Arthritis of knee 09/02/2014   Staghorn kidney stones 03/17/2014   Nephrolithiasis 02/01/2014   Pneumonia  08/27/2011   Diabetes mellitus with coincident hypertension ([place]) 08/27/2011   ONSET DATE: 12/27/21  REFERRING DIAG: M33.20 (ICD-10-CM) - Polymyositis, organ involvement unspecified   THERAPY DIAG:  Muscle weakness (generalized)  Other abnormalities of gait and mobility  Right shoulder pain, unspecified chronicity  Left shoulder pain, unspecified chronicity  Rationale for Evaluation and Treatment Rehabilitation  PERTINENT HISTORY: Suspected polymyositis; non-ST-elevation myocardial infarction (NSTEMI) s/p percutaneous coronary intervention (PCI) and drug-eluting stent (DES) placement 12/28/21; incidental small silent brainstem stroke (R pons infarct) identified per MRI; cervical spine neuroforaminal stenosis  Bilateral shoulder pain w/ R worse than L - partial to near full-thickness supraspinatus tear of R shoulder  Additional PMH: CAD, DMT2, HTN, dyslipidemia, nephrolithiasis, recent transaminitis, h/o stage I prostate cancer, and R TKA in 2016  PRECAUTIONS: Fall; gradual return to driving; don't lift over 10 lbs  SUBJECTIVE:  SUBJECTIVE STATEMENT: Pt reports he enjoys being in the pool and would like to try getting in the water more often. States his muscle biopsy is scheduled for tomorrow, but will not have the results for a few weeks  PAIN: Are you having pain? 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, chartered at the [employer]; travels for bands/festivals; yardwork  OBJECTIVE:   HAND DOMINANCE: Left  UPPER EXTREMITY ROM     Active ROM Right Eval - 5/25  6/27  7/6  Shoulder flexion 51 48 52  Shoulder abduction 50 68 63  Shoulder extension 40    Shoulder IR 80    Shoulder ER 40    Elbow flexion 135    Elbow extension 41 3   (Blank rows = not tested)  Active ROM Left Eval - 5/25  6/27  7/6  Shoulder flexion  55 79 96  Shoulder abduction 58 87   Shoulder extension 47    Shoulder IR 84    Shoulder ER 35    Elbow flexion 138    Elbow extension 6 4   (Blank rows = not tested)   TODAY'S TREATMENT - 03/31/22: Reciprocal arm bike for conditioning and BUE strengthening on Level 2.0 for 6 minutes total (forward and backward) Wall clock exercise for scapular stability x15 on each side; pt positioned w/ yellow resistance band looped around wrists and elbows slightly flexed, tapping at 1, 3, 5 o'clock w/ RUE and 11, 9, 7 w/ LUE, coming back to center each time. OT provided mod tactile/verbal cues to decrease compensatory movements, particularly w/ trunk when moving RUE Pushing against 55 cm exercise ball held by therapist to facilitate isometric contraction and strengthening of muscles involved in shoulder flexion; completed 2x10 w/ BUEs and OT providing min cues to keep arms from compensatory abduction, especially w/ fatigue BUE shoulder abduction completed sitting upright 2x15; 1st set attempted against light resistance w/ 1 lb dumbbell, 2nd set focusing AROM only. No significant difference in success w/ alignment or arc of motion observed between sets   PATIENT EDUCATION: Education provided on potential benefit  of aquatic therapy considering pt's interest and provided corresponding handout prn Person educated: Patient Education method: Explanation; handout provided for tub/shower stability grab bar Education comprehension: verbalized understanding   HOME EXERCISE PROGRAM: MedBridge Code: [id number] - Supine Shoulder Flexion AAROM with Hands Clasped  - 2 x daily - 1 sets - 10 reps - 5 sec hold - Shoulder Flexion Wall Slide with Towel  - 2 x daily - 7 x weekly - 2 sets - 5 reps - Seated Shoulder Flexion with Dowel to 90  - 2 x daily - 2-3 sets - 10-15 reps - Seated Shoulder Row with Anchored Resistance  - 2 x daily - 2-3 sets - 10-15 reps - Seated Shoulder External Rotation with Dumbbells  - 2 x daily -  2-3 sets - 10-15 reps - Seated Upper Trapezius Stretch  - 2 x daily - 7 x weekly - 1 sets - 25 reps - Gentle Levator Scapulae Stretch  - 2 x daily - 7 x weekly - 1 sets - 25 reps  GOALS: Goals reviewed with patient? Yes  SHORT TERM GOALS: Target date: 03/31/22  STG  Status:  1 Pt will verbalize understanding of at least 3 fall prevention and/or energy conservation strategies to increase safety w/ functional mobility at home Baseline: Decreased knowledge of strategies Met - 02/24/22  2 Pt will demonstrate independence w/ recommended DME and/or AE (e.g., long-handle sponge, dressing stick, etc.) to improve participation and safety w/ ADLs Baseline: Decreased knowledge of AE Met - 03/31/22  3 Pt will improve functional use of BUEs as evidence by being able to achieve at least 70 degrees of shoulder flexion w/out pain Baseline: RUE: 51 deg, LUE: 55 deg Met - 03/31/22:  RUE: 79 deg,  LUE: 96 deg  4 Pt will improve functional bilateral reach by increasing R elbow extension to at least 25 degrees Baseline: 41* of flexion when attempting extension Met - 03/22/22; WFL    LONG TERM GOALS: Target date: 05/12/22  LTG  Status:  1 Pt will demonstrate independence w/ full HEP designed for ROM and strength of BUEs Baseline: No HEP at this time Progressing  2 Pt will improve QuickDASH score by at least 19 points to indicate mild functional impairment Baseline: 59.1 (moderate, borderline severe) impairment) Progressing  3 Pt will improve functional use of BUEs as evidence by improving strength of BUE shoulder flexion to at least 3+/5  Baseline: To be assessed Progressing  4 With both hands, pt will be able to grip w/ at least 20 lbs of force to improve functional use of BUEs during IADLs Baseline: Unable to assess at eval due to medical restrictions Progressing  5 Pt will be able to retrieve lightweight object (< 5 lbs) from overhead height w/out pain by d/c Baseline: Unable to reach overhead Progressing     ASSESSMENT:  CLINICAL IMPRESSION: Per chart review, results of NCV w/ EMG suggest peripheral neuropathy, moderate L carpal tunnel syndrome, and L ulnar neuropathy. Pt denies concerning symptoms and is mostly focusing on improving his strength as much as able. Muscle biopsy is scheduled for tomorrow and pt is anxious to receive these results. OT continued to address BUE strengthening and endurance, focusing on preparatory ROM and strengthening for functional reach. Pt demonstrates improvements in BUEs w/ LUE less impacted than RUE. Also discussed likely benefit of aquatic OT 1x/week coupled w/ OP OT 1x/week considering pt's functional goals and pt appears excited for this. OT to contact aquatic therapist.  PERFORMANCE DEFICITS in  functional skills including ADLs, IADLs, ROM, strength, pain, GMC, mobility, balance, body mechanics, endurance, cardiopulmonary status limiting function, decreased knowledge of precautions, decreased knowledge of use of DME, and UE functional use.   IMPAIRMENTS are limiting patient from ADLs, IADLs, rest and sleep, work, leisure, and social participation.   COMORBIDITIES has co-morbidities such as NSTEMI, CAD, and DM  that affects occupational performance. Patient will benefit from skilled OT to address above impairments and improve overall function.   PLAN: OT FREQUENCY: 2x/week  OT DURATION: 10 weeks  PLANNED INTERVENTIONS: self care/ADL training, therapeutic exercise, therapeutic activity, manual therapy, passive range of motion, functional mobility training, aquatic therapy, moist heat, cryotherapy, patient/family education, energy conservation, DME and/or AE instructions, and Re-evaluation  RECOMMENDED OTHER SERVICES: Currently receiving PT services at this location  CONSULTED AND AGREED WITH PLAN OF CARE: Patient  PLAN FOR NEXT SESSION: Follow-up on aquatic therapy; Continue to focus on BUE strength and shoulder flexion (particularly activation of R  anterior deltoid/pec) for overhead ROM bilaterally (R > L)   Kathrine Cords, MSOT, OTR/L 03/31/2022, 2:43 PM

## 2022-04-01 ENCOUNTER — Ambulatory Visit: Payer: Self-pay | Admitting: Surgery

## 2022-04-01 DIAGNOSIS — M6281 Muscle weakness (generalized): Secondary | ICD-10-CM | POA: Diagnosis not present

## 2022-04-03 ENCOUNTER — Encounter: Payer: Self-pay | Admitting: Neurology

## 2022-04-03 ENCOUNTER — Encounter: Payer: Self-pay | Admitting: Occupational Therapy

## 2022-04-05 ENCOUNTER — Ambulatory Visit: Payer: Medicare Other

## 2022-04-05 ENCOUNTER — Ambulatory Visit: Payer: Medicare Other | Admitting: Occupational Therapy

## 2022-04-05 ENCOUNTER — Encounter: Payer: Self-pay | Admitting: Occupational Therapy

## 2022-04-05 DIAGNOSIS — R2681 Unsteadiness on feet: Secondary | ICD-10-CM | POA: Diagnosis not present

## 2022-04-05 DIAGNOSIS — M25512 Pain in left shoulder: Secondary | ICD-10-CM | POA: Diagnosis not present

## 2022-04-05 DIAGNOSIS — R2689 Other abnormalities of gait and mobility: Secondary | ICD-10-CM

## 2022-04-05 DIAGNOSIS — R262 Difficulty in walking, not elsewhere classified: Secondary | ICD-10-CM | POA: Diagnosis not present

## 2022-04-05 DIAGNOSIS — R293 Abnormal posture: Secondary | ICD-10-CM | POA: Diagnosis not present

## 2022-04-05 DIAGNOSIS — M3320 Polymyositis, organ involvement unspecified: Secondary | ICD-10-CM | POA: Diagnosis not present

## 2022-04-05 DIAGNOSIS — M25511 Pain in right shoulder: Secondary | ICD-10-CM

## 2022-04-05 DIAGNOSIS — M6281 Muscle weakness (generalized): Secondary | ICD-10-CM

## 2022-04-05 DIAGNOSIS — R278 Other lack of coordination: Secondary | ICD-10-CM | POA: Diagnosis not present

## 2022-04-05 DIAGNOSIS — R296 Repeated falls: Secondary | ICD-10-CM | POA: Diagnosis not present

## 2022-04-05 NOTE — Therapy (Signed)
OUTPATIENT OCCUPATIONAL THERAPY TREATMENT NOTE   Patient Name: [name] MRN: [id number] DOB:[birth year], 66 y.o., male Today's Date: 04/05/2022  PCP: Alinda Deem, MD REFERRING PROVIDER: Charlton Amor, PA-C   END OF SESSION:   OT End of Session - 04/05/22 1024     Visit Number 9    Number of Visits 21    Date for OT Re-Evaluation 05/12/22    Authorization Type BCBS Medicare    Authorization Time Period VL: [place]    OT Start Time 1021    OT Stop Time 1101    OT Time Calculation (min) 40 min    Activity Tolerance Patient tolerated treatment well    Behavior During Therapy WFL for tasks assessed/performed            Past Medical History:  Diagnosis Date   Arthritis    Diabetes mellitus    fasting 120s   History of kidney stones    Hypertension    Peripheral neuropathy    left fifth finger, left toe   Pneumonia    hx of   Past Surgical History:  Procedure Laterality Date   APPENDECTOMY     CORONARY STENT INTERVENTION N/A 12/28/2021   Procedure: CORONARY STENT INTERVENTION;  Surgeon: Corky Crafts, MD;  Location: [facility];  Service: Cardiovascular;  Laterality: N/A;   CYSTOSCOPY W/ URETERAL STENT PLACEMENT Bilateral 03/17/2014   Procedure: CYSTOSCOPY WITH RIGHT STENT PULL  ;  Surgeon: Sebastian Ache, MD;  Location: [facility];  Service: Urology;  Laterality: Bilateral;   CYSTOSCOPY WITH RETROGRADE PYELOGRAM, URETEROSCOPY AND STENT PLACEMENT Bilateral 02/01/2014   Procedure: CYSTOSCOPY WITH BILATERAL RETROGRADE CYSTOSCOPY, BILATERAL RETROGRADE, PYELOGRAM, RIGHT URETEROSCOPY, STONE BASKETRY,  AND BILATERAL STENT PLACEMENT;  Surgeon: Sebastian Ache, MD;  Location: [facility];  Service: Urology;  Laterality: Bilateral;   CYSTOSCOPY WITH URETEROSCOPY AND STENT PLACEMENT Left 02/12/2021   Procedure: CYSTOSCOPY WITH LEFT URETEROSCOPY, BALLOON DILITATION, HOLMIUM LASER LITHOTRIPSY AND STENT PLACEMENT;  Surgeon: Crista Elliot, MD;  Location: [facility];  Service:  Urology;  Laterality: Left;   HOLMIUM LASER APPLICATION Left 03/19/2014   Procedure: HOLMIUM LASER APPLICATION;  Surgeon: Sebastian Ache, MD;  Location: [facility];  Service: Urology;  Laterality: Left;   INTRAVASCULAR ULTRASOUND/IVUS N/A 12/28/2021   Procedure: Intravascular Ultrasound/IVUS;  Surgeon: Corky Crafts, MD;  Location: [facility];  Service: Cardiovascular;  Laterality: N/A;   LEFT HEART CATH AND CORONARY ANGIOGRAPHY N/A 12/28/2021   Procedure: LEFT HEART CATH AND CORONARY ANGIOGRAPHY;  Surgeon: Corky Crafts, MD;  Location: [facility] INVASIVE [facility];  Service: Cardiovascular;  Laterality: N/A;   LITHOTRIPSY     NEPHROLITHOTOMY Left 03/17/2014   Procedure: NEPHROLITHOTOMY PERCUTANEOUS with access;  Surgeon: Sebastian Ache, MD;  Location: [facility];  Service: Urology;  Laterality: Left;   NEPHROLITHOTOMY Left 03/19/2014   Procedure: 2ND STAGE NEPHROLITHOTOMY PERCUTANEOUS left ureteroscopy with lasr stone treatment and stent placement, with access;  Surgeon: Sebastian Ache, MD;  Location: [facility];  Service: Urology;  Laterality: Left;   TOTAL KNEE ARTHROPLASTY Right 09/02/2014   Procedure: RIGHT TOTAL KNEE ARTHROPLASTY;  Surgeon: Cammy Copa, MD;  Location: [facility] OR;  Service: Orthopedics;  Laterality: Right;   uretral stent     Patient Active Problem List   Diagnosis Date Noted   Diabetic peripheral neuropathy associated with type 2 diabetes mellitus (HCC) 03/23/2022   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   Gait abnormality 03/07/2022   Cerebrovascular accident (CVA) (HCC) 03/07/2022   Myalgia due  to statin 02/28/2022   Mild aortic stenosis 02/28/2022   Polymyositis (HCC) 02/04/2022   Elevated CK 02/02/2022   Elevated LFTs 02/02/2022   CAD (coronary artery disease) 02/02/2022   Muscle weakness of all 4 extremities 02/02/2022   Hyperlipidemia 01/28/2022   Arthritis of knee 09/02/2014   Staghorn kidney stones 03/17/2014   Nephrolithiasis 02/01/2014   Pneumonia 08/27/2011    Diabetes mellitus with coincident hypertension (HCC) 08/27/2011   ONSET DATE: 12/27/21  REFERRING DIAG: M33.20 (ICD-10-CM) - Polymyositis, organ involvement unspecified   THERAPY DIAG:  Muscle weakness (generalized)  Right shoulder pain, unspecified chronicity  Left shoulder pain, unspecified chronicity  Other abnormalities of gait and mobility  Rationale for Evaluation and Treatment Rehabilitation  PERTINENT HISTORY: Suspected polymyositis; non-ST-elevation myocardial infarction (NSTEMI) s/p percutaneous coronary intervention (PCI) and drug-eluting stent (DES) placement 12/28/21; incidental small silent brainstem stroke (R pons infarct) identified per MRI; cervical spine neuroforaminal stenosis  R shoulder partial to near full-thickness supraspinatus tear; bilateral shoulder pain w/ R > L  Additional PMH: CAD, DMT2, HTN, dyslipidemia, nephrolithiasis, recent transaminitis, h/o stage I prostate cancer, and R TKA in 2016  PRECAUTIONS: Fall; gradual return to driving; don't lift over 10 lbs  SUBJECTIVE:  SUBJECTIVE STATEMENT: "I feel stronger"  PAIN: Are you having pain? 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, drilling at the [employer]; travels for bands/festivals; yardwork  OBJECTIVE:   HAND DOMINANCE: Left  UPPER EXTREMITY ROM     Active ROM Right Eval - 5/25  6/27  7/6  Shoulder flexion 51 48 52  Shoulder abduction 50 68 63  Shoulder extension 40    Shoulder IR 80    Shoulder ER 40    Elbow flexion 135    Elbow extension 41 3   (Blank rows = not tested)  Active ROM Left Eval - 5/25  6/27  7/6  Shoulder flexion 55 79 96  Shoulder abduction 58 87   Shoulder extension 47    Shoulder IR 84    Shoulder ER 35    Elbow flexion 138    Elbow extension 6 4   (Blank rows = not tested)   TODAY'S TREATMENT -  04/05/22: Back extension against anchored resistance (yellow theraband) while seated; completed 2x15 w/ OT providing occ verbal cues to decrease compensatory row movement, focusing on engaging core Attempted closed-chain shoulder flexion while seated upright and pt continued to demonstrate difficulty achieving good arc of motion, particularly w/ RUE. OT repositioned pt to supine to assess ROM w/ scapular supported and pt was able to complete PROM of shoulder flexion and ER [facility] w/out difficulty or pain. OT provided interpretation of difference between success in supine position and encouraged pt to attempt supine exercises included in HEP w/ weight for increased challenge. Min A to return to standing w/ BUE support provided by therapist. Closed-chain shoulder flexion w/ elbows flexed for continued emphasis on activation of shoulder; OT provided visual target for consistency w/ arc of motion and occ verbal cues to maintain position of elbow flexion vs compensating w/ bicep curl movement. Completed 2x15 w/ rest break between sets   PATIENT EDUCATION: Ongoing condition-specific education related to therapeutic interventions completed this session. Also reviewed movement considerations and precautions related to rotator cuff injury due to partial-near full thickness tear of supraspinatus Person educated: Patient Education method: Explanation; handout provided for tub/shower stability grab bar  Education comprehension: verbalized understanding   HOME EXERCISE PROGRAM: MedBridge Code: [id number] - Supine Shoulder Flexion AAROM with Hands Clasped  - 2 x daily - 1 sets - 10 reps - 5 sec hold - Shoulder Flexion Wall Slide with Towel  - 2 x daily - 7 x weekly - 2 sets - 5 reps - Seated Shoulder Flexion with Dowel to 90  - 2 x daily - 2-3 sets - 10-15 reps - Seated Shoulder Row with Anchored Resistance  - 2 x daily - 2-3 sets - 10-15 reps - Seated Shoulder External Rotation with Dumbbells  - 2 x daily - 2-3 sets  - 10-15 reps - Seated Upper Trapezius Stretch  - 2 x daily - 7 x weekly - 1 sets - 25 reps - Gentle Levator Scapulae Stretch  - 2 x daily - 7 x weekly - 1 sets - 25 reps  GOALS: Goals reviewed with patient? Yes  SHORT TERM GOALS: Target date: 03/31/22  STG  Status:  1 Pt will verbalize understanding of at least 3 fall prevention and/or energy conservation strategies to increase safety w/ functional mobility at home Baseline: Decreased knowledge of strategies Met - 02/24/22  2 Pt will demonstrate independence w/ recommended DME and/or AE (e.g., long-handle sponge, dressing stick, etc.) to improve participation and safety w/ ADLs Baseline: Decreased knowledge of AE Met - 03/31/22  3 Pt will improve functional use of BUEs as evidence by being able to achieve at least 70 degrees of shoulder flexion w/out pain Baseline: RUE: 51 deg, LUE: 55 deg Met - 03/31/22:  RUE: 79 deg,  LUE: 96 deg  4 Pt will improve functional bilateral reach by increasing R elbow extension to at least 25 degrees Baseline: 41* of flexion when attempting extension Met - 03/22/22; WFL    LONG TERM GOALS: Target date: 05/12/22  LTG  Status:  1 Pt will demonstrate independence w/ full HEP designed for ROM and strength of BUEs Baseline: No HEP at this time Progressing  2 Pt will improve QuickDASH score by at least 19 points to indicate mild functional impairment Baseline: 59.1 (moderate, borderline severe) impairment) Progressing  3 Pt will improve functional use of BUEs during grooming tasks (e.g., shampooing hair) as evidenced by improving strength of BUE shoulder flexion to at least 3+/5  Baseline: To be assessed Progressing  4 With both hands, pt will be able to grip w/ at least 20 lbs of force to improve functional use of BUEs during IADLs Baseline: Unable to assess at eval due to medical restrictions Progressing  5 Pt will be able to retrieve lightweight object (< 5 lbs) from overhead height w/out pain by d/c Baseline:  Unable to reach overhead Progressing    ASSESSMENT:  CLINICAL IMPRESSION: Pt continues to make progress toward goals and is very motivated to improve w/ great carryover of HEP. OT continued to address BUE strengthening and returned to address partial to near full thickness supraspinatus tear education and considerations due to continued improvements w/ shoulder AROM. Pt continues to deny pain w/ only c/o persistent weakness. Significantly improved ROM when supine. OT also address core strength, simulating pt retrieving cast iron skillet from the oven w/ resistance band exercise and encourage pt to practice this at home. Pt to start aquatic therapy next week.  PERFORMANCE DEFICITS in functional skills including ADLs, IADLs, ROM, strength, pain, GMC, mobility, balance, body mechanics, endurance, cardiopulmonary status limiting function, decreased knowledge of precautions, decreased knowledge of use of DME, and UE functional  use.   IMPAIRMENTS are limiting patient from ADLs, IADLs, rest and sleep, work, leisure, and social participation.   COMORBIDITIES has co-morbidities such as NSTEMI, CAD, and DM  that affects occupational performance. Patient will benefit from skilled OT to address above impairments and improve overall function.   PLAN: OT FREQUENCY: 2x/week  OT DURATION: 10 weeks  PLANNED INTERVENTIONS: self care/ADL training, therapeutic exercise, therapeutic activity, manual therapy, passive range of motion, functional mobility training, aquatic therapy, moist heat, cryotherapy, patient/family education, energy conservation, DME and/or AE instructions, and Re-evaluation  RECOMMENDED OTHER SERVICES: Currently receiving PT services at this location  CONSULTED AND AGREED WITH PLAN OF CARE: Patient  PLAN FOR NEXT SESSION: Continue to focus on core and BUE strength (R more impacted than L)   Rosie Fate, MSOT, OTR/L 04/05/2022, 11:27 AM

## 2022-04-05 NOTE — Therapy (Signed)
OUTPATIENT PHYSICAL THERAPY LOWER EXTREMITY TREATMENT   Patient Name: [name] MRN: [id number] DOB:[birth year], 66 y.o., male Today's Date: 04/05/2022   PT End of Session - 04/05/22 0930     Visit Number 11    Progress Note Due on Visit 10    PT Start Time 0930    PT Stop Time 1015    PT Time Calculation (min) 45 min    Equipment Utilized During Treatment Gait belt    Activity Tolerance Patient tolerated treatment well;Patient limited by fatigue    Behavior During Therapy WFL for tasks assessed/performed               Past Medical History:  Diagnosis Date   Arthritis    Diabetes mellitus    fasting 120s   History of kidney stones    Hypertension    Peripheral neuropathy    left fifth finger, left toe   Pneumonia    hx of   Past Surgical History:  Procedure Laterality Date   APPENDECTOMY     CORONARY STENT INTERVENTION N/A 12/28/2021   Procedure: CORONARY STENT INTERVENTION;  Surgeon: Jettie Booze, MD;  Location: [place] [facility];  Service: Cardiovascular;  Laterality: N/A;   CYSTOSCOPY W/ URETERAL STENT PLACEMENT Bilateral 03/17/2014   Procedure: CYSTOSCOPY WITH RIGHT STENT PULL  ;  Surgeon: Alexis Frock, MD;  Location: [facility];  Service: Urology;  Laterality: Bilateral;   CYSTOSCOPY WITH RETROGRADE PYELOGRAM, URETEROSCOPY AND STENT PLACEMENT Bilateral 02/01/2014   Procedure: CYSTOSCOPY WITH BILATERAL RETROGRADE CYSTOSCOPY, BILATERAL RETROGRADE, PYELOGRAM, RIGHT URETEROSCOPY, STONE BASKETRY,  AND BILATERAL STENT PLACEMENT;  Surgeon: Alexis Frock, MD;  Location: [facility];  Service: Urology;  Laterality: Bilateral;   CYSTOSCOPY WITH URETEROSCOPY AND STENT PLACEMENT Left 02/12/2021   Procedure: CYSTOSCOPY WITH LEFT URETEROSCOPY, BALLOON DILITATION, HOLMIUM LASER LITHOTRIPSY AND STENT PLACEMENT;  Surgeon: Lucas Mallow, MD;  Location: [facility];  Service: Urology;  Laterality: Left;   HOLMIUM LASER APPLICATION Left 4/82/7078   Procedure: HOLMIUM LASER  APPLICATION;  Surgeon: Alexis Frock, MD;  Location: [facility];  Service: Urology;  Laterality: Left;   INTRAVASCULAR ULTRASOUND/IVUS N/A 12/28/2021   Procedure: Intravascular Ultrasound/IVUS;  Surgeon: Jettie Booze, MD;  Location: [place] [facility];  Service: Cardiovascular;  Laterality: N/A;   LEFT HEART CATH AND CORONARY ANGIOGRAPHY N/A 12/28/2021   Procedure: LEFT HEART CATH AND CORONARY ANGIOGRAPHY;  Surgeon: Jettie Booze, MD;  Location: [place] [facility];  Service: Cardiovascular;  Laterality: N/A;   LITHOTRIPSY     NEPHROLITHOTOMY Left 03/17/2014   Procedure: NEPHROLITHOTOMY PERCUTANEOUS with access;  Surgeon: Alexis Frock, MD;  Location: [facility];  Service: Urology;  Laterality: Left;   NEPHROLITHOTOMY Left 03/19/2014   Procedure: 2ND STAGE NEPHROLITHOTOMY PERCUTANEOUS left ureteroscopy with lasr stone treatment and stent placement, with access;  Surgeon: Alexis Frock, MD;  Location: [facility];  Service: Urology;  Laterality: Left;   TOTAL KNEE ARTHROPLASTY Right 09/02/2014   Procedure: RIGHT TOTAL KNEE ARTHROPLASTY;  Surgeon: Meredith Pel, MD;  Location: [place];  Service: Orthopedics;  Laterality: Right;   uretral stent     Patient Active Problem List   Diagnosis Date Noted   Diabetic peripheral neuropathy associated with type 2 diabetes mellitus ([place]) 03/23/2022   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   Gait abnormality 03/07/2022   Cerebrovascular accident (CVA) ([place]) 03/07/2022   Myalgia due to statin 02/28/2022   Mild aortic stenosis 02/28/2022   Polymyositis ([place]) 02/04/2022   Elevated CK 02/02/2022  Elevated LFTs 02/02/2022   CAD (coronary artery disease) 02/02/2022   Muscle weakness of all 4 extremities 02/02/2022   Hyperlipidemia 01/28/2022   Arthritis of knee 09/02/2014   Staghorn kidney stones 03/17/2014   Nephrolithiasis 02/01/2014   Pneumonia 08/27/2011   Diabetes mellitus with coincident hypertension ([place]) 08/27/2011    PCP: Greig Right,  MD  REFERRING PROVIDER: Cathlyn Parsons, PA-C   REFERRING DIAG: M33.20 (ICD-10-CM) - Polymyositis, organ involvement unspecified   THERAPY DIAG:  Muscle weakness (generalized)  Other abnormalities of gait and mobility  Difficulty in walking, not elsewhere classified  Unsteadiness on feet  Polymyositis ([place])  Other lack of coordination  Rationale for Evaluation and Treatment Rehabilitation  ONSET DATE: 02/15/2022   SUBJECTIVE:   SUBJECTIVE STATEMENT:  "I feel the strongest I have felt in a long time"   PERTINENT HISTORY: [name] is a 66 y.o. right-handed male with history of type 2 diabetes mellitus hypertension intolerance of statin due to abnormal LFTs (off for the past years), CAD status post DES to RCA 12/28/2021 (addition of arterial for statin 20 mg, aspirin/Brilinta),   PAIN:  Are you having pain? No  PRECAUTIONS: None  WEIGHT BEARING RESTRICTIONS No  FALLS:  Has patient fallen in last 6 months? Yes. Number of falls 5  LIVING ENVIRONMENT: Lives with: lives with their family and lives alone Lives in: House/apartment Stairs: No Has following equipment at home: Gilford Rile - 4 wheeled, shower chair, Grab bars, and None  OCCUPATION: Comptroller, office work, Government social research officer.  PLOF: Independent  PATIENT GOALS Increase strength, return to his active lifestyle. 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 Travels to attend concerts.   OBJECTIVE:   MUSCLE LENGTH: Hamstrings: Right 62 deg; Left 68 deg   POSTURE: rounded shoulders   LOWER EXTREMITY ROM: WFL throughout, R hip with mild     tightness.   LOWER EXTREMITY MMT:  MMT Right eval Left eval Right 03/31/22 Left 03/31/22  Hip flexion 4- 3+ 4- 4-  Hip extension 4- 3+ 4- 3+  Hip abduction 4- 3+ 4- 3+  Hip adduction      Hip internal rotation      Hip external rotation      Knee flexion 4 4- 4 4  Knee extension 4 4- 4 4  Ankle dorsiflexion 4 4    Ankle  plantarflexion      Ankle inversion      Ankle eversion       (Blank rows = not tested)   FUNCTIONAL TESTS:    5x STS18.22 BERG 43/56   TODAY'S TREATMENT: 04/05/22 Nustep L5 x71mns Bike L4 x5 mins  Walking up and down incline outside x2 Calf raises 2x10 Knee ext 20# 3x10 Knee flex 15# 3x10 Step ups 6" 2x10   03/30/22 Nustep L5 x627ms 5xSTS (unable to complete 5, stopped at 4 in 20s)  BERG (44/56) Walking 10 laps w/o cane  Cybex flex 15# 2x15 Cybex ext 20# 2x12  03/22/22 Nustep L4 x6m58m STS on Airex 2x10 Walking outside on grass Walking down and up hill  Cybex knee flex 25# 2x10 Cybex knee ext 25# 2x10  03/17/22 Bike L4, 6mi63mResisted gait 15# forwards and sideways x5 Hip abd and ext 3# 2x10 Side steps on foam Step overs 4" Walking without cane 3 laps   03/15/22 Nustep L5, 6min35mBE forwards/back 6 mins STS elevated table holding red ball 2x10 Crunches with black band 3x10 Step ups forwards and sideways 20reps 4"  Standing on foam feet together catch Tandem on foam 30s    03/10/22 Nustep L5, 70mns  Shoulder flexion w/dowel 2x10  Bicep curls 4# 2x10 Walking without cane 3 laps CGA Step ups on foam CGA, 2 LOB  Standing on foam playing catch minA  2 way hip ext/abd 2x10       PATIENT EDUCATION:  Education details: POC Person educated: Patient Education method: Explanation, TCorporate treasurercues, and Verbal cues Education comprehension: verbalized understanding, returned demonstration, verbal cues required, and tactile cues required    Patent educated about keeping lights on during the night for safety and to prevent future falls. Also educated about collection of HEP, and to not do all exercises but rather pick a few to do in order to avoid fatigue.  HOME EXERCISE PROGRAM: [id number] ASSESSMENT:  CLINICAL IMPRESSION: Patient is walking without a cane today. Still walks with ataxic gait pattern. We focused on endurance today, starting with Nustep and then  the bike. We also did some walking outdoors up and down incline, requires CGA. Decreased foot clearance uphill. Patient presents with fatigue and needs additional rest breaks throughout session. Ended with some general LE strengthening.     OBJECTIVE IMPAIRMENTS Abnormal gait, decreased activity tolerance, decreased balance, decreased coordination, decreased endurance, decreased mobility, difficulty walking, decreased strength, improper body mechanics, and postural dysfunction.   ACTIVITY LIMITATIONS carrying, lifting, bending, standing, squatting, stairs, transfers, bed mobility, and locomotion level  PARTICIPATION LIMITATIONS: meal prep, cleaning, laundry, driving, shopping, community activity, occupation, and yard work  PERSONAL FACTORS Past/current experiences are also affecting patient's functional outcome.   REHAB POTENTIAL: Good  CLINICAL DECISION MAKING: Evolving/moderate complexity  EVALUATION COMPLEXITY: Moderate   GOALS: Goals reviewed with patient? Yes  SHORT TERM GOALS: Target date: 03/17/2022   I with basic HEP Baseline: Goal status: MET    LONG TERM GOALS: Target date: 05/12/2022   I with final HEP Baseline:  Goal status: INITIAL  2.  Patient will score at least 50/56 on BERG Balance Exam Baseline: 43, 76/23 (44) Goal status: IN PROGRESS  3.  Paitent will complete 5x sit to stand in < 12 sec to demonstrate improved strength in BLE. Baseline:  Goal status: IN PROGRESS  4.  Patient will ambulate at least 1000' with LRAD, MI, on level and unlevel surfaces Baseline: 03/31/22 able to walk 1000' w/o AD on even surfaces, CGA due to LOB with fatigue Goal status: IN PROGRESS  5.  Patient will be able to manage cooking and cleaning activities in his home without falling and without requiring a rest break. Baseline:  Goal status: IN PROGRESS   PLAN: PT FREQUENCY: 2x/week  PT DURATION: 12 weeks  PLANNED INTERVENTIONS: Therapeutic exercises, Therapeutic  activity, Neuromuscular re-education, Balance training, Gait training, Patient/Family education, Joint mobilization, Stair training, and Manual therapy  PLAN FOR NEXT SESSION: gait and balance activities, core strengthening   MAndris Baumann DPT 04/05/2022, 10:15 AM

## 2022-04-06 NOTE — Therapy (Signed)
OUTPATIENT PHYSICAL THERAPY LOWER EXTREMITY TREATMENT   Patient Name: GRACESON NICHELSON MRN: [id number] DOB:05-27-1956, 66 y.o., male Today's Date: 04/07/2022   PT End of Session - 04/07/22 0930     Visit Number 12    Progress Note Due on Visit 10    PT Start Time 0930    PT Stop Time 1015    PT Time Calculation (min) 45 min    Equipment Utilized During Treatment --    Activity Tolerance Patient tolerated treatment well;Patient limited by fatigue    Behavior During Therapy WFL for tasks assessed/performed                Past Medical History:  Diagnosis Date   Arthritis    Diabetes mellitus    fasting 120s   History of kidney stones    Hypertension    Peripheral neuropathy    left fifth finger, left toe   Pneumonia    hx of   Past Surgical History:  Procedure Laterality Date   APPENDECTOMY     CORONARY STENT INTERVENTION N/A 12/28/2021   Procedure: CORONARY STENT INTERVENTION;  Surgeon: Corky Crafts, MD;  Location: [facility];  Service: Cardiovascular;  Laterality: N/A;   CYSTOSCOPY W/ URETERAL STENT PLACEMENT Bilateral 03/17/2014   Procedure: CYSTOSCOPY WITH RIGHT STENT PULL  ;  Surgeon: Sebastian Ache, MD;  Location: [facility];  Service: Urology;  Laterality: Bilateral;   CYSTOSCOPY WITH RETROGRADE PYELOGRAM, URETEROSCOPY AND STENT PLACEMENT Bilateral 02/01/2014   Procedure: CYSTOSCOPY WITH BILATERAL RETROGRADE CYSTOSCOPY, BILATERAL RETROGRADE, PYELOGRAM, RIGHT URETEROSCOPY, STONE BASKETRY,  AND BILATERAL STENT PLACEMENT;  Surgeon: Sebastian Ache, MD;  Location: [facility];  Service: Urology;  Laterality: Bilateral;   CYSTOSCOPY WITH URETEROSCOPY AND STENT PLACEMENT Left 02/12/2021   Procedure: CYSTOSCOPY WITH LEFT URETEROSCOPY, BALLOON DILITATION, HOLMIUM LASER LITHOTRIPSY AND STENT PLACEMENT;  Surgeon: Crista Elliot, MD;  Location: [facility];  Service: Urology;  Laterality: Left;   HOLMIUM LASER APPLICATION Left 03/19/2014   Procedure: HOLMIUM LASER APPLICATION;   Surgeon: Sebastian Ache, MD;  Location: [facility];  Service: Urology;  Laterality: Left;   INTRAVASCULAR ULTRASOUND/IVUS N/A 12/28/2021   Procedure: Intravascular Ultrasound/IVUS;  Surgeon: Corky Crafts, MD;  Location: [facility];  Service: Cardiovascular;  Laterality: N/A;   LEFT HEART CATH AND CORONARY ANGIOGRAPHY N/A 12/28/2021   Procedure: LEFT HEART CATH AND CORONARY ANGIOGRAPHY;  Surgeon: Corky Crafts, MD;  Location: [facility] INVASIVE CV [facility];  Service: Cardiovascular;  Laterality: N/A;   LITHOTRIPSY     NEPHROLITHOTOMY Left 03/17/2014   Procedure: NEPHROLITHOTOMY PERCUTANEOUS with access;  Surgeon: Sebastian Ache, MD;  Location: [facility];  Service: Urology;  Laterality: Left;   NEPHROLITHOTOMY Left 03/19/2014   Procedure: 2ND STAGE NEPHROLITHOTOMY PERCUTANEOUS left ureteroscopy with lasr stone treatment and stent placement, with access;  Surgeon: Sebastian Ache, MD;  Location: [facility];  Service: Urology;  Laterality: Left;   TOTAL KNEE ARTHROPLASTY Right 09/02/2014   Procedure: RIGHT TOTAL KNEE ARTHROPLASTY;  Surgeon: Cammy Copa, MD;  Location: [facility];  Service: Orthopedics;  Laterality: Right;   uretral stent     Patient Active Problem List   Diagnosis Date Noted   Diabetic peripheral neuropathy associated with type 2 diabetes mellitus (HCC) 03/23/2022   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   Gait abnormality 03/07/2022   Cerebrovascular accident (CVA) (HCC) 03/07/2022   Myalgia due to statin 02/28/2022   Mild aortic stenosis 02/28/2022   Polymyositis (HCC) 02/04/2022   Elevated CK 02/02/2022  Elevated LFTs 02/02/2022   CAD (coronary artery disease) 02/02/2022   Muscle weakness of all 4 extremities 02/02/2022   Hyperlipidemia 01/28/2022   Arthritis of knee 09/02/2014   Staghorn kidney stones 03/17/2014   Nephrolithiasis 02/01/2014   Pneumonia 08/27/2011   Diabetes mellitus with coincident hypertension ([place]) 08/27/2011    PCP: Greig Right, MD  REFERRING  PROVIDER: Cathlyn Parsons, PA-C   REFERRING DIAG: M33.20 (ICD-10-CM) - Polymyositis, organ involvement unspecified   THERAPY DIAG:  Muscle weakness (generalized)  Difficulty in walking, not elsewhere classified  Other abnormalities of gait and mobility  Unsteadiness on feet  Other lack of coordination  Polymyositis ([employer])  Abnormal posture  Rationale for Evaluation and Treatment Rehabilitation  ONSET DATE: 02/15/2022   SUBJECTIVE:   SUBJECTIVE STATEMENT:  Morning has been discombobulated, my neurologist is starting me on a new steroid infusions. No pain and no falls. Starting aquatic therapy Monday.   PERTINENT HISTORY: MCARTHUR IVINS is a 66 y.o. right-handed male with history of type 2 diabetes mellitus hypertension intolerance of statin due to abnormal LFTs (off for the past years), CAD status post DES to RCA 12/28/2021 (addition of arterial for statin 20 mg, aspirin/Brilinta),   PAIN:  Are you having pain? No  PRECAUTIONS: None  WEIGHT BEARING RESTRICTIONS No  FALLS:  Has patient fallen in last 6 months? Yes. Number of falls 5  LIVING ENVIRONMENT: Lives with: lives with their family and lives alone Lives in: House/apartment Stairs: No Has following equipment at home: Gilford Rile - 4 wheeled, shower chair, Grab bars, and None  OCCUPATION: Comptroller, office work, Government social research officer.  PLOF: Independent  PATIENT GOALS Increase strength, return to his active lifestyle. 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 Travels to attend concerts.   OBJECTIVE:   MUSCLE LENGTH: Hamstrings: Right 62 deg; Left 68 deg   POSTURE: rounded shoulders   LOWER EXTREMITY ROM: WFL throughout, R hip with mild     tightness.   LOWER EXTREMITY MMT:  MMT Right eval Left eval Right 03/31/22 Left 03/31/22  Hip flexion 4- 3+ 4- 4-  Hip extension 4- 3+ 4- 3+  Hip abduction 4- 3+ 4- 3+  Hip adduction      Hip internal rotation      Hip external  rotation      Knee flexion 4 4- 4 4  Knee extension 4 4- 4 4  Ankle dorsiflexion 4 4    Ankle plantarflexion      Ankle inversion      Ankle eversion       (Blank rows = not tested)   FUNCTIONAL TESTS:    5x STS18.22 BERG 43/56   TODAY'S TREATMENT: 04/07/22 Bike L5 x13mins Leg press 40# 2x10  Resisted gait 15# 5x forwards and sidesteps Hip ext 5# 2x10 each side  Crunches w/blackTB 2x15 Back ext black TB 2x15 Gastroc stretch on slant 67min   04/05/22 Nustep L5 x81mins Bike L4 x5 mins  Walking up and down incline outside x2 Calf raises 2x10 Knee ext 20# 3x10 Knee flex 15# 3x10 Step ups 6" 2x10   03/30/22 Nustep L5 x58mins 5xSTS (unable to complete 5, stopped at 4 in 20s)  BERG (44/56) Walking 10 laps w/o cane  Cybex flex 15# 2x15 Cybex ext 20# 2x12  03/22/22 Nustep L4 x49mins STS on Airex 2x10 Walking outside on grass Walking down and up hill  Cybex knee flex 25# 2x10 Cybex knee ext 25# 2x10  03/17/22 Bike L4, 71mins Resisted  gait 15# forwards and sideways x5 Hip abd and ext 3# 2x10 Side steps on foam Step overs 4" Walking without cane 3 laps   03/15/22 Nustep L5, 44mins UBE forwards/back 6 mins STS elevated table holding red ball 2x10 Crunches with black band 3x10 Step ups forwards and sideways 20reps 4" Standing on foam feet together catch Tandem on foam 30s    03/10/22 Nustep L5, 28mins  Shoulder flexion w/dowel 2x10  Bicep curls 4# 2x10 Walking without cane 3 laps CGA Step ups on foam CGA, 2 LOB  Standing on foam playing catch minA  2 way hip ext/abd 2x10       PATIENT EDUCATION:  Education details: POC Person educated: Patient Education method: Explanation, Corporate treasurer cues, and Verbal cues Education comprehension: verbalized understanding, returned demonstration, verbal cues required, and tactile cues required    Patent educated about keeping lights on during the night for safety and to prevent future falls. Also educated about collection of  HEP, and to not do all exercises but rather pick a few to do in order to avoid fatigue.  HOME EXERCISE PROGRAM: [id number]  ASSESSMENT:  CLINICAL IMPRESSION: Patient is walking without a cane today. Still walks with ataxic gait pattern. We focused on endurance today, starting with Nustep and then the bike. We also did some walking outdoors up and down incline, requires CGA. Decreased foot clearance uphill. Patient presents with fatigue and needs additional rest breaks throughout session. Ended with some general LE strengthening.    Patient presents with SOB and fatigue with leg press and needs a long break after completing 2 sets. We continued with progressive LE strengthening and added a few core exercises. Unable to do hip abd against 5# weight due to difficulty    OBJECTIVE IMPAIRMENTS Abnormal gait, decreased activity tolerance, decreased balance, decreased coordination, decreased endurance, decreased mobility, difficulty walking, decreased strength, improper body mechanics, and postural dysfunction.   ACTIVITY LIMITATIONS carrying, lifting, bending, standing, squatting, stairs, transfers, bed mobility, and locomotion level  PARTICIPATION LIMITATIONS: meal prep, cleaning, laundry, driving, shopping, community activity, occupation, and yard work  PERSONAL FACTORS Past/current experiences are also affecting patient's functional outcome.   REHAB POTENTIAL: Good  CLINICAL DECISION MAKING: Evolving/moderate complexity  EVALUATION COMPLEXITY: Moderate   GOALS: Goals reviewed with patient? Yes  SHORT TERM GOALS: Target date: 03/17/2022   I with basic HEP Baseline: Goal status: MET    LONG TERM GOALS: Target date: 05/12/2022   I with final HEP Baseline:  Goal status: INITIAL  2.  Patient will score at least 50/56 on BERG Balance Exam Baseline: 43, 76/23 (44) Goal status: IN PROGRESS  3.  Paitent will complete 5x sit to stand in < 12 sec to demonstrate improved strength in  BLE. Baseline:  Goal status: IN PROGRESS  4.  Patient will ambulate at least 1000' with LRAD, MI, on level and unlevel surfaces Baseline: 03/31/22 able to walk 1000' w/o AD on even surfaces, CGA due to LOB with fatigue Goal status: IN PROGRESS  5.  Patient will be able to manage cooking and cleaning activities in his home without falling and without requiring a rest break. Baseline:  Goal status: IN PROGRESS   PLAN: PT FREQUENCY: 2x/week  PT DURATION: 12 weeks  PLANNED INTERVENTIONS: Therapeutic exercises, Therapeutic activity, Neuromuscular re-education, Balance training, Gait training, Patient/Family education, Joint mobilization, Stair training, and Manual therapy  PLAN FOR NEXT SESSION: gait and balance activities, core strengthening   Andris Baumann, DPT 04/07/2022, 10:15 AM

## 2022-04-07 ENCOUNTER — Encounter: Payer: Self-pay | Admitting: Occupational Therapy

## 2022-04-07 ENCOUNTER — Ambulatory Visit: Payer: Medicare Other | Admitting: Occupational Therapy

## 2022-04-07 ENCOUNTER — Ambulatory Visit: Payer: Medicare Other

## 2022-04-07 DIAGNOSIS — R262 Difficulty in walking, not elsewhere classified: Secondary | ICD-10-CM | POA: Diagnosis not present

## 2022-04-07 DIAGNOSIS — M6281 Muscle weakness (generalized): Secondary | ICD-10-CM

## 2022-04-07 DIAGNOSIS — R2681 Unsteadiness on feet: Secondary | ICD-10-CM | POA: Diagnosis not present

## 2022-04-07 DIAGNOSIS — M3320 Polymyositis, organ involvement unspecified: Secondary | ICD-10-CM | POA: Diagnosis not present

## 2022-04-07 DIAGNOSIS — R293 Abnormal posture: Secondary | ICD-10-CM | POA: Diagnosis not present

## 2022-04-07 DIAGNOSIS — M25511 Pain in right shoulder: Secondary | ICD-10-CM

## 2022-04-07 DIAGNOSIS — R296 Repeated falls: Secondary | ICD-10-CM | POA: Diagnosis not present

## 2022-04-07 DIAGNOSIS — R2689 Other abnormalities of gait and mobility: Secondary | ICD-10-CM | POA: Diagnosis not present

## 2022-04-07 DIAGNOSIS — M25512 Pain in left shoulder: Secondary | ICD-10-CM | POA: Diagnosis not present

## 2022-04-07 DIAGNOSIS — R278 Other lack of coordination: Secondary | ICD-10-CM

## 2022-04-07 NOTE — Therapy (Signed)
OUTPATIENT OCCUPATIONAL THERAPY TREATMENT NOTE & PROGRESS NOTE   Patient Name: NIKOLI NASSER MRN: [id number] DOB:1956-02-11, 66 y.o., male Today's Date: 04/07/2022  PCP: Greig Right, MD REFERRING PROVIDER: Cathlyn Parsons, PA-C   END OF SESSION:     OT End of Session - 04/07/22 1724      Visit Number 10    Number of Visits 21     Date for OT Re-Evaluation 05/12/22     Authorization Type BCBS Medicare     Authorization Time Period VL: [place]     OT Start Time 1015     OT Stop Time 1058     OT Time Calculation (min) 43 min     Activity Tolerance Patient tolerated treatment well     Behavior During Therapy WFL for tasks assessed/performed       Occupational Therapy Progress Note  Dates of Reporting Period: 02/17/22 to 04/07/22  Objective Reports of Subjective Statement: Pt continues to deny pain; undergoing further evaluation with muscle biopsy  Objective Measurements: UE AROM, QuickDASH, MMT, levels of independence  Goal Update: Pt has met 4/4 STGs and 3/6 LTGs; progressing well toward remaining goals  Plan: Continue to address core and BUE strength for improved safety and independence w/ functional activities  Reason Skilled Services are Required: Pt continues to experience weakness affecting primarily proximal muscles and will benefit from continued skilled OT services to address strength and coordination, ROM, pain management, balance, compensatory strategies/AE prn, and HEP progression to improve participation and safety during ADLs and hopeful return to work and leisure pursuits.    Past Medical History:  Diagnosis Date   Arthritis    Diabetes mellitus    fasting 120s   History of kidney stones    Hypertension    Peripheral neuropathy    left fifth finger, left toe   Pneumonia    hx of   Past Surgical History:  Procedure Laterality Date   APPENDECTOMY     CORONARY STENT INTERVENTION N/A 12/28/2021   Procedure: CORONARY STENT INTERVENTION;  Surgeon: Jettie Booze, MD;  Location: [place] [facility];  Service: Cardiovascular;  Laterality: N/A;   CYSTOSCOPY W/ URETERAL STENT PLACEMENT Bilateral 03/17/2014   Procedure: CYSTOSCOPY WITH RIGHT STENT PULL  ;  Surgeon: Alexis Frock, MD;  Location: [facility];  Service: Urology;  Laterality: Bilateral;   CYSTOSCOPY WITH RETROGRADE PYELOGRAM, URETEROSCOPY AND STENT PLACEMENT Bilateral 02/01/2014   Procedure: CYSTOSCOPY WITH BILATERAL RETROGRADE CYSTOSCOPY, BILATERAL RETROGRADE, PYELOGRAM, RIGHT URETEROSCOPY, STONE BASKETRY,  AND BILATERAL STENT PLACEMENT;  Surgeon: Alexis Frock, MD;  Location: [facility];  Service: Urology;  Laterality: Bilateral;   CYSTOSCOPY WITH URETEROSCOPY AND STENT PLACEMENT Left 02/12/2021   Procedure: CYSTOSCOPY WITH LEFT URETEROSCOPY, BALLOON DILITATION, HOLMIUM LASER LITHOTRIPSY AND STENT PLACEMENT;  Surgeon: Lucas Mallow, MD;  Location: [facility];  Service: Urology;  Laterality: Left;   HOLMIUM LASER APPLICATION Left 7/78/2423   Procedure: HOLMIUM LASER APPLICATION;  Surgeon: Alexis Frock, MD;  Location: [facility];  Service: Urology;  Laterality: Left;   INTRAVASCULAR ULTRASOUND/IVUS N/A 12/28/2021   Procedure: Intravascular Ultrasound/IVUS;  Surgeon: Jettie Booze, MD;  Location: [place] [facility];  Service: Cardiovascular;  Laterality: N/A;   LEFT HEART CATH AND CORONARY ANGIOGRAPHY N/A 12/28/2021   Procedure: LEFT HEART CATH AND CORONARY ANGIOGRAPHY;  Surgeon: Jettie Booze, MD;  Location: [place] [facility];  Service: Cardiovascular;  Laterality: N/A;   LITHOTRIPSY     NEPHROLITHOTOMY Left 03/17/2014   Procedure: NEPHROLITHOTOMY  PERCUTANEOUS with access;  Surgeon: Alexis Frock, MD;  Location: [facility];  Service: Urology;  Laterality: Left;   NEPHROLITHOTOMY Left 03/19/2014   Procedure: 2ND STAGE NEPHROLITHOTOMY PERCUTANEOUS left ureteroscopy with lasr stone treatment and stent placement, with access;  Surgeon: Alexis Frock, MD;  Location: [facility];  Service: Urology;   Laterality: Left;   TOTAL KNEE ARTHROPLASTY Right 09/02/2014   Procedure: RIGHT TOTAL KNEE ARTHROPLASTY;  Surgeon: Meredith Pel, MD;  Location: [place];  Service: Orthopedics;  Laterality: Right;   uretral stent     Patient Active Problem List   Diagnosis Date Noted   Diabetic peripheral neuropathy associated with type 2 diabetes mellitus ([place]) 03/23/2022   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   Gait abnormality 03/07/2022   Cerebrovascular accident (CVA) ([place]) 03/07/2022   Myalgia due to statin 02/28/2022   Mild aortic stenosis 02/28/2022   Polymyositis ([place]) 02/04/2022   Elevated CK 02/02/2022   Elevated LFTs 02/02/2022   CAD (coronary artery disease) 02/02/2022   Muscle weakness of all 4 extremities 02/02/2022   Hyperlipidemia 01/28/2022   Arthritis of knee 09/02/2014   Staghorn kidney stones 03/17/2014   Nephrolithiasis 02/01/2014   Pneumonia 08/27/2011   Diabetes mellitus with coincident hypertension ([place]) 08/27/2011   ONSET DATE: 12/27/21  REFERRING DIAG: M33.20 (ICD-10-CM) - Polymyositis, organ involvement unspecified   THERAPY DIAG:  Muscle weakness (generalized)  Right shoulder pain, unspecified chronicity  Left shoulder pain, unspecified chronicity  Other abnormalities of gait and mobility  Repeated falls  Rationale for Evaluation and Treatment Rehabilitation  PERTINENT HISTORY: Suspected polymyositis; non-ST-elevation myocardial infarction (NSTEMI) s/p percutaneous coronary intervention (PCI) and drug-eluting stent (DES) placement 12/28/21; incidental small silent brainstem stroke (R pons infarct) identified per MRI; cervical spine neuroforaminal stenosis  R shoulder partial to near full-thickness supraspinatus tear; bilateral shoulder pain w/ R > L  Additional PMH: CAD, DMT2, HTN, dyslipidemia, nephrolithiasis, recent transaminitis, h/o stage I prostate cancer, and R TKA in 2016  PRECAUTIONS: Fall; gradual return to driving; don't lift over 10  lbs  SUBJECTIVE:  SUBJECTIVE STATEMENT: Pt reports he will be starting steroid infusions next week that will be on Mon-Thurs for 4 hours each day  PAIN: Are you having pain? 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, chartered at the [employer]; travels for bands/festivals; yardwork  OBJECTIVE:   HAND DOMINANCE: Left  UPPER EXTREMITY ROM     Active ROM Right Eval - 5/25  6/27  7/6  Shoulder flexion 51 48 52  Shoulder abduction 50 68 63  Shoulder extension 40    Shoulder IR 80    Shoulder ER 40    Elbow flexion 135    Elbow extension 41 3   (Blank rows = not tested)  Active ROM Left Eval - 5/25  6/27  7/6  Shoulder flexion 55 79 96  Shoulder abduction 58 87   Shoulder extension 47    Shoulder IR 84    Shoulder ER 35    Elbow flexion 138    Elbow extension 6 4   (Blank rows = not tested)   TODAY'S TREATMENT - 04/07/22: Reciprocal arm bike for conditioning and BUE strengthening on Level 2.5; 6 minutes total (forward and back)  Shoulder flexion glide w/ outer pipe over PVC dowel: 2x10 w/ RUE and OT providing Mod A, occ fading cues to Min A prn 2x15 w/ LUE and OT  providing verbal/tactile cues to decreased compensatory movements Wall slides completed 2x10 on each side w/ tennis ball only for RUE vs 1st set w/ tennis ball and 2nd set w/ 2.2 lb med ball for LUE to increase challenge. OT provided occ tactile facilitation for success and prevention of compensatory shoulder hiking/trunk lateral flexion *Rest breaks taken between all exercises and between sets prn due to decreased activity tolerance   PATIENT EDUCATION: Ongoing condition-specific education related to therapeutic interventions completed this session Person educated: Patient Education method: Explanation Education comprehension: verbalized  understanding   HOME EXERCISE PROGRAM: MedBridge Code: [id number] - Supine Shoulder Flexion AAROM with Hands Clasped  - 2 x daily - 1 sets - 10 reps - 5 sec hold - Shoulder Flexion Wall Slide with Towel  - 2 x daily - 7 x weekly - 2 sets - 5 reps - Seated Shoulder Flexion with Dowel to 90  - 2 x daily - 2-3 sets - 10-15 reps - Seated Shoulder Row with Anchored Resistance  - 2 x daily - 2-3 sets - 10-15 reps - Seated Shoulder External Rotation with Dumbbells  - 2 x daily - 2-3 sets - 10-15 reps - Seated Upper Trapezius Stretch  - 2 x daily - 7 x weekly - 1 sets - 25 reps - Gentle Levator Scapulae Stretch  - 2 x daily - 7 x weekly - 1 sets - 25 reps  GOALS: Goals reviewed with patient? Yes  SHORT TERM GOALS: Target date: 03/31/22  STG  Status:  1 Pt will verbalize understanding of at least 3 fall prevention and/or energy conservation strategies to increase safety w/ functional mobility at home Baseline: Decreased knowledge of strategies Met - 02/24/22  2 Pt will demonstrate independence w/ recommended DME and/or AE (e.g., long-handle sponge, dressing stick, etc.) to improve participation and safety w/ ADLs Baseline: Decreased knowledge of AE Met - 03/31/22  3 Pt will improve functional use of BUEs as evidence by being able to achieve at least 70 degrees of shoulder flexion w/out pain Baseline: RUE: 51 deg, LUE: 55 deg Met - 03/31/22:  RUE: 79 deg,  LUE: 96 deg  4 Pt will improve functional bilateral reach by increasing R elbow extension to at least 25 degrees Baseline: 41* of flexion when attempting extension Met - 03/22/22; WFL    LONG TERM GOALS: Target date: 05/12/22  LTG  Status:  1 Pt will demonstrate independence w/ full HEP designed for ROM and strength of BUEs Baseline: No HEP at this time Progressing  2 Pt will improve QuickDASH score by at least 19 points to indicate mild functional impairment Baseline: 59.1 (moderate, borderline severe) impairment) Met - 04/07/22: 22.7  3 Pt will  improve functional use of BUEs during grooming tasks (e.g., shampooing hair) as evidenced by improving strength of BUE shoulder flexion to at least 3+/5  Baseline: To be assessed Progressing  4 With both hands, pt will be able to grip w/ at least 20 lbs of force to improve functional use of BUEs during IADLs Baseline: Unable to assess at eval due to medical restrictions Met - 04/07/22: RUE 56 lbs, LUE 73 lbs  5 Pt will be able to retrieve lightweight object (< 5 lbs) from overhead height w/out pain by d/c Baseline: Unable to reach overhead Met - 04/07/22  6 Pt will be able to retrieve medium-weight object (~5 lbs) from both low and overhead heights by d/c Baseline:  Progressing    ASSESSMENT:  CLINICAL IMPRESSION: Pt continues to  make progress toward goals and is very motivated to improve w/ great carryover of HEP. OT continued to address BUE strengthening, focusing on active-assisted movement and preventing compensatory movement patterns. Pt continues to deny pain w/ only c/o persistent weakness. Pt will be taking a short time away from therapy to start infusions, but will return to OP OT/PT and start aquatic OT, which he is looking forward to. OT also reassessed progress toward functional goals considering improvements across recent sessions w/ pt improving QuickDASH score by almost 37 points and demonstrating grip strength WFL. RUE continues to be more impacted than LUE.  PERFORMANCE DEFICITS in functional skills including ADLs, IADLs, ROM, strength, pain, GMC, mobility, balance, body mechanics, endurance, cardiopulmonary status limiting function, decreased knowledge of precautions, decreased knowledge of use of DME, and UE functional use.   IMPAIRMENTS are limiting patient from ADLs, IADLs, rest and sleep, work, leisure, and social participation.   COMORBIDITIES has co-morbidities such as NSTEMI, CAD, and DM  that affects occupational performance. Patient will benefit from skilled OT to address  above impairments and improve overall function.   PLAN: OT FREQUENCY: 2x/week  OT DURATION: 10 weeks  PLANNED INTERVENTIONS: self care/ADL training, therapeutic exercise, therapeutic activity, manual therapy, passive range of motion, functional mobility training, aquatic therapy, moist heat, cryotherapy, patient/family education, energy conservation, DME and/or AE instructions, and Re-evaluation  RECOMMENDED OTHER SERVICES: Currently receiving PT services at this location  CONSULTED AND AGREED WITH PLAN OF CARE: Patient  PLAN FOR NEXT SESSION: Continue to focus on core and BUE strength (R more impacted than L)   Kathrine Cords, MSOT, OTR/L 04/07/2022, 11:09 AM

## 2022-04-08 ENCOUNTER — Encounter: Payer: Self-pay | Admitting: Physical Medicine & Rehabilitation

## 2022-04-08 ENCOUNTER — Encounter: Payer: Medicare Other | Attending: Physical Medicine & Rehabilitation | Admitting: Physical Medicine & Rehabilitation

## 2022-04-08 VITALS — Ht 70.0 in | Wt 178.0 lb

## 2022-04-08 DIAGNOSIS — G7249 Other inflammatory and immune myopathies, not elsewhere classified: Secondary | ICD-10-CM | POA: Diagnosis not present

## 2022-04-08 NOTE — Patient Instructions (Signed)
Polymyositis Polymyositis is a disease that causes inflammation and weakness of the muscles. It can also affect skin tissues. Polymyositis is also known as idiopathic inflammatory myopathy. It is often associated with diseases in which the body mistakenly attacks its own cells and tissues (autoimmune diseases). What are the causes? The cause of polymyositis is not known. What increases the risk? The following factors may make you more likely to develop this condition: Gender. Polymyositis is more common in women. Age. The disease is more common in adults. It is rare before age 74. Ethnicity. It is more common in Black people than White people. What are the signs or symptoms? Symptoms of this condition include: Weakness in your neck, shoulders, upper arms, hips, or thighs. It affects both sides of the body equally. The weakness usually develops over weeks to months. Having difficulty: Rising from a seated position. Climbing stairs. Lifting objects. Reaching overhead. Swallowing. Sore muscles are possible, but often there is little to no pain in the muscles. Fatigue. Fever. Hard bumps under your skin. Unexplained weight loss. How is this diagnosed? This condition may be diagnosed based on your medical history and a physical exam. Various tests may also be done, including: Blood tests to check skeletal muscle enzymes and inflammation. Often, test results that show inflammation are elevated. MRI. EMG (electromyography). This is a test to check the electrical activity of your muscles. Biopsy. This is a test in which a sample of muscle tissue is taken and checked under a microscope. How is this treated? There is no cure for polymyositis, but treatment can improve muscle strength and function. The earlier the treatment is started, the more effective it is. Treatment may include: Corticosteroid medicines to help control inflammation. These medicines have serious side effects. Once your  symptoms are better controlled, the dose should be lowered to find the lowest possible dose that controls your symptoms. Immunoglobulin medicines to introduce healthy antibodies from blood donors. An antibody is a type of protein that is part of the body's disease-fighting system (immune system). Immunosuppressive medicines to control the activity of the immune system. Physical therapy to strengthen muscles. Speech and language therapy to improve your ability to talk and eat. Follow these instructions at home: Safety  Stay active. An exercise routine can help you build and maintain muscle strength. Talk with your health care provider or your physical therapist before you start any exercise program. If necessary, take steps to prevent falls by: Installing grab bars for your tub, shower, and toilet. Installing a pole that goes from floor to ceiling. This helps you when going from a seated to a standing position. Protect your skin from the sun. Wear sunscreen or protective clothing when you are outside. General instructions  Work closely with your health care team, including physical and speech therapists, if needed. Take over-the-counter and prescription medicines only as told by your health care provider. Avoid alcohol. Rest when you are tired. Do not use any products that contain nicotine or tobacco. These products include cigarettes, chewing tobacco, and vaping devices, such as e-cigarettes. If you need help quitting, ask your health care provider. Keep all follow-up visits. This is important. Where to find support Myositis Support and Understanding: understandingmyositis.org The Myositis Association: myositis.org Contact a health care provider if you: Develop new or worsening muscle weakness. Get help right away if you have: Trouble swallowing or speaking. Shortness of breath. These symptoms may represent a serious problem that is an emergency. Do not wait to see if the symptoms  will  go away. Get medical help right away. Call your local emergency services (911 in the [place] Do not drive yourself to the hospital. Summary Polymyositis is a disease that causes inflammation and weakness of your muscles. It can also affect your skin tissues. It is often associated with diseases in which the body attacks its own cells and tissues (autoimmune diseases). The cause of polymyositis is not known. There is no cure for polymyositis, but treatment can improve muscle strength and function. The earlier treatment is started, the more effective it is. This information is not intended to replace advice given to you by your health care provider. Make sure you discuss any questions you have with your health care provider. Document Revised: 01/14/2021 Document Reviewed: 01/14/2021 Elsevier Patient Education  [place].

## 2022-04-08 NOTE — Progress Notes (Signed)
Subjective:    Patient ID: [name], male    DOB: 1956-03-30, 66 y.o.   MRN: [id number] 66 y.o. right-handed male with history of type 2 diabetes mellitus hypertension intolerance of statin due to abnormal LFTs (off for the past years), CAD status post DES to RCA 12/28/2021 (addition of arterial for statin 20 mg, aspirin/Brilinta), reports 30 pound unintentional weight loss over the past year with progressive bilateral upper lower extremity weakness over the past 4 to 5 months.  Since starting statins again, he started having near falls, significant worsening of weakness and presented to the ED for work-up on 02/03/2022.  MRI of the brain done revealing acute right pontine infarct and felt to be incidental.  CTA head and neck was negative for LVO but showed extensive atherosclerosis, 70% right ICA stenosis 50% left ICA stenosis, 50% right VA and 40% left VA stenosis.  MRI of the spine showed cervical thoracic and lumbar spine negative for cord compression or inflammatory changes.  CT abdomen/pelvis was negative for metastatic disease, intra-abdominal or intrathoracic pathology with moderate nonobstructive left nephrolithiasis with moderate cortical atrophy.  2D echo bubble study was negative for IA shunt. Neurology consulted felt symptoms concerning for myopathy-CK elevated 7291.  He was treated with a 3-day course of Solu-Medrol as well as IV fluids for hydration.  General surgery consulted for muscle biopsy but would need to be off Brilinta for procedure.  Cardiology recommended waiting at least 3 months due to need for DAPT secondary to recent drug-eluting stent and planned biopsy 3 to 4 months.  HMG/COA reductase antibodies ordered as well as recommendations for muscle biopsy.  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  Also rate of  improvement since steroids initiated also favors inflammatory myopathy.  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  Abnormal LFTs and CK trending down (1195 today).  He completed 3-day course of Solu-Medrol and strength felt to be plateaued-transition to prednisone 80 mg daily.  Patient continue be limited by weakness legs gave out with nursing, increased unsteadiness and admitted for a comprehensive rehab program.    Admit date: 02/08/2022 Discharge date: 02/16/2022  HPI 66 year old male with history of idiopathic polymyositis.  He completed his inpatient rehabilitation stay and is now at home living independently for the last 2 weeks.  He is independent with all self-care and mobility at this time but still feels weak especially in his core muscles. PT, OT as OP, 2x per week incl aquatic  Working abbreviated hours   Scheduled for steroid infusions  Gen Surg 7/25 biopsy Right vastus lat   Fell on his face first night at home  Back to driving   The patient has followed up with neurology Still on Steroid '30mg'$  prednisone per day with plans to reduce to '20mg'$  for a maintenance dose   Pain Inventory Average Pain 0 Pain Right Now 0 My pain is  no pain  LOCATION OF PAIN  no pain  BOWEL Number of stools per week: 4-7 Oral laxative use No   BLADDER Normal    Mobility walk with assistance use a cane use a walker how many minutes can you walk? 20 ability to climb steps?  yes do you drive?  yes Do you have any goals in this area?  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  yes  Neuro/Psych weakness trouble walking  Prior Studies Any changes since last visit?  no  Physicians involved in your care Any changes since last visit?  no   Family History  Problem Relation Age of Onset    Alzheimer's disease Mother    Heart attack Father    Social History   Socioeconomic History   Marital status: Widowed    Spouse name: Not on file   Number of children: 2   Years of education: 3 yeae   Highest education level: Not on file  Occupational History   Not on file  Tobacco Use   Smoking status: Never   Smokeless tobacco: Never  Vaping Use   Vaping Use: Never used  Substance and Sexual Activity   Alcohol use: Yes    Alcohol/week: 2.0 standard drinks of alcohol    Types: 2 Cans of beer per week    Comment: occasional/ 1-2 beers week   Drug use: No   Sexual activity: Yes    Birth control/protection: Other-see comments  Other Topics Concern   Not on file  Social History Narrative   Not on file   Social Determinants of Health   Financial Resource Strain: Not on file  Food Insecurity: Not on file  Transportation Needs: Not on file  Physical Activity: Not on file  Stress: Not on file  Social Connections: Not on file   Past Surgical History:  Procedure Laterality Date   APPENDECTOMY     CORONARY STENT INTERVENTION N/A 12/28/2021   Procedure: CORONARY STENT INTERVENTION;  Surgeon: Jettie Booze, MD;  Location: [facility];  Service: Cardiovascular;  Laterality: N/A;   CYSTOSCOPY W/ URETERAL STENT PLACEMENT Bilateral 03/17/2014   Procedure: CYSTOSCOPY WITH RIGHT STENT PULL  ;  Surgeon: Alexis Frock, MD;  Location: [facility];  Service: Urology;  Laterality: Bilateral;   CYSTOSCOPY WITH RETROGRADE PYELOGRAM, URETEROSCOPY AND STENT PLACEMENT Bilateral 02/01/2014   Procedure: CYSTOSCOPY WITH BILATERAL RETROGRADE CYSTOSCOPY, BILATERAL RETROGRADE, PYELOGRAM, RIGHT URETEROSCOPY, STONE BASKETRY,  AND BILATERAL STENT PLACEMENT;  Surgeon: Alexis Frock, MD;  Location: [facility];  Service: Urology;  Laterality: Bilateral;   CYSTOSCOPY WITH URETEROSCOPY AND STENT PLACEMENT Left 02/12/2021   Procedure: CYSTOSCOPY WITH LEFT URETEROSCOPY, BALLOON DILITATION, HOLMIUM LASER  LITHOTRIPSY AND STENT PLACEMENT;  Surgeon: Lucas Mallow, MD;  Location: [facility];  Service: Urology;  Laterality: Left;   HOLMIUM LASER APPLICATION Left 2/77/8242   Procedure: HOLMIUM LASER APPLICATION;  Surgeon: Alexis Frock, MD;  Location: [facility];  Service: Urology;  Laterality: Left;   INTRAVASCULAR ULTRASOUND/IVUS N/A 12/28/2021   Procedure: Intravascular Ultrasound/IVUS;  Surgeon: Jettie Booze, MD;  Location: [facility];  Service: Cardiovascular;  Laterality: N/A;   LEFT HEART CATH AND CORONARY ANGIOGRAPHY N/A 12/28/2021   Procedure: LEFT HEART CATH AND CORONARY ANGIOGRAPHY;  Surgeon: Jettie Booze, MD;  Location: [facility];  Service: Cardiovascular;  Laterality: N/A;   LITHOTRIPSY     NEPHROLITHOTOMY Left 03/17/2014   Procedure: NEPHROLITHOTOMY PERCUTANEOUS with access;  Surgeon: Alexis Frock, MD;  Location: [facility];  Service: Urology;  Laterality: Left;   NEPHROLITHOTOMY Left 03/19/2014   Procedure: 2ND STAGE NEPHROLITHOTOMY PERCUTANEOUS left ureteroscopy with lasr stone treatment and stent placement, with access;  Surgeon: Alexis Frock, MD;  Location: [facility];  Service: Urology;  Laterality: Left;   TOTAL KNEE ARTHROPLASTY Right 09/02/2014   Procedure: RIGHT TOTAL KNEE ARTHROPLASTY;  Surgeon: Meredith Pel, MD;  Location: [facility];  Service: Orthopedics;  Laterality: Right;   uretral stent     Past Medical History:  Diagnosis Date   Arthritis    Diabetes mellitus    fasting 120s   History of kidney stones    Hypertension    Peripheral neuropathy    left fifth finger, left toe   Pneumonia    hx of   Ht '5\' 10"'$  (1.778 m)   Wt 178 lb (80.7 kg)   BMI 25.54 kg/m   Opioid Risk Score:   Fall Risk Score:  `1  Depression screen [facility] 2/9     04/08/2022   12:47 PM  Depression screen PHQ 2/9  Decreased Interest 0  Down, Depressed, Hopeless 0  PHQ - 2 Score 0  Altered sleeping 1  Tired, decreased energy 1  Change in appetite 1  Feeling bad or  failure about yourself  0  Trouble concentrating 0  Moving slowly or fidgety/restless 1  Suicidal thoughts 0  PHQ-9 Score 4    Review of Systems  Musculoskeletal:  Positive for gait problem.  Neurological:  Positive for weakness.  All other systems reviewed and are negative.      Objective:   Physical Exam Vitals and nursing note reviewed.  Constitutional:      Appearance: He is normal weight.  HENT:     Head: Normocephalic and atraumatic.  Skin:    General: Skin is warm and dry.  Neurological:     Mental Status: He is alert.     Comments: Motor strength is 3 - bilateral deltoid 4 at the bicep and tricep 5 at the finger flexors on the left and fourth finger flexors on the right Hip extensors are 3 - Hip flexors are 3 - Knee extensors are 4 Ankle dorsiflexors are 5 Sensation is normal Ambulates with a waddling gait Sit to stand with Gowers maneuver           Assessment & Plan:    1.  Polymyositis affecting proximal musculature.  He is undergoing further evaluation with muscle biopsy.  He is on a steroid taper with goal of maintenance at 20 mg of prednisone currently at 30 mg. He is completing outpatient PT OT and is making excellent progress. Physical medicine and rehab follow-up on as-needed basis.

## 2022-04-11 ENCOUNTER — Encounter: Payer: Self-pay | Admitting: Occupational Therapy

## 2022-04-11 ENCOUNTER — Ambulatory Visit: Payer: Medicare Other | Admitting: Occupational Therapy

## 2022-04-11 DIAGNOSIS — R278 Other lack of coordination: Secondary | ICD-10-CM

## 2022-04-11 DIAGNOSIS — M25511 Pain in right shoulder: Secondary | ICD-10-CM

## 2022-04-11 DIAGNOSIS — M25512 Pain in left shoulder: Secondary | ICD-10-CM

## 2022-04-11 DIAGNOSIS — R2681 Unsteadiness on feet: Secondary | ICD-10-CM

## 2022-04-11 DIAGNOSIS — M6281 Muscle weakness (generalized): Secondary | ICD-10-CM

## 2022-04-11 NOTE — Therapy (Unsigned)
OUTPATIENT OCCUPATIONAL THERAPY TREATMENT NOTE & PROGRESS NOTE   Patient Name: [name] MRN: [id number] DOB:1955-10-13, 66 y.o., male Today's Date: 04/11/2022  PCP: Greig Right, MD REFERRING PROVIDER: Cathlyn Parsons, PA-C   END OF SESSION:  OT End of Session - 04/11/22 1732     Visit Number 11    Number of Visits 21    Date for OT Re-Evaluation 05/12/22    Authorization Type BCBS Medicare    Authorization Time Period VL: [place]    OT Start Time 1530    OT Stop Time 1623    OT Time Calculation (min) 53 min    Activity Tolerance Patient tolerated treatment well    Behavior During Therapy [facility] for tasks assessed/performed               OT End of Session - 04/07/22 1724      Visit Number 10    Number of Visits 21     Date for OT Re-Evaluation 05/12/22     Authorization Type BCBS Medicare     Authorization Time Period VL: [place]     OT Start Time 1015     OT Stop Time 1058     OT Time Calculation (min) 43 min     Activity Tolerance Patient tolerated treatment well     Behavior During Therapy WFL for tasks assessed/performed       Occupational Therapy Progress Note  Dates of Reporting Period: 02/17/22 to 04/07/22  Objective Reports of Subjective Statement: Pt continues to deny pain; undergoing further evaluation with muscle biopsy  Objective Measurements: UE AROM, QuickDASH, MMT, levels of independence  Goal Update: Pt has met 4/4 STGs and 3/6 LTGs; progressing well toward remaining goals  Plan: Continue to address core and BUE strength for improved safety and independence w/ functional activities  Reason Skilled Services are Required: Pt continues to experience weakness affecting primarily proximal muscles and will benefit from continued skilled OT services to address strength and coordination, ROM, pain management, balance, compensatory strategies/AE prn, and HEP progression to improve participation and safety during ADLs and hopeful return to work and leisure  pursuits.    Past Medical History:  Diagnosis Date   Arthritis    Diabetes mellitus    fasting 120s   History of kidney stones    Hypertension    Peripheral neuropathy    left fifth finger, left toe   Pneumonia    hx of   Past Surgical History:  Procedure Laterality Date   APPENDECTOMY     CORONARY STENT INTERVENTION N/A 12/28/2021   Procedure: CORONARY STENT INTERVENTION;  Surgeon: Jettie Booze, MD;  Location: [place] [facility];  Service: Cardiovascular;  Laterality: N/A;   CYSTOSCOPY W/ URETERAL STENT PLACEMENT Bilateral 03/17/2014   Procedure: CYSTOSCOPY WITH RIGHT STENT PULL  ;  Surgeon: Alexis Frock, MD;  Location: [facility];  Service: Urology;  Laterality: Bilateral;   CYSTOSCOPY WITH RETROGRADE PYELOGRAM, URETEROSCOPY AND STENT PLACEMENT Bilateral 02/01/2014   Procedure: CYSTOSCOPY WITH BILATERAL RETROGRADE CYSTOSCOPY, BILATERAL RETROGRADE, PYELOGRAM, RIGHT URETEROSCOPY, STONE BASKETRY,  AND BILATERAL STENT PLACEMENT;  Surgeon: Alexis Frock, MD;  Location: [facility];  Service: Urology;  Laterality: Bilateral;   CYSTOSCOPY WITH URETEROSCOPY AND STENT PLACEMENT Left 02/12/2021   Procedure: CYSTOSCOPY WITH LEFT URETEROSCOPY, BALLOON DILITATION, HOLMIUM LASER LITHOTRIPSY AND STENT PLACEMENT;  Surgeon: Lucas Mallow, MD;  Location: [facility];  Service: Urology;  Laterality: Left;   HOLMIUM LASER APPLICATION Left 1/96/2229   Procedure: HOLMIUM  LASER APPLICATION;  Surgeon: Alexis Frock, MD;  Location: [facility];  Service: Urology;  Laterality: Left;   INTRAVASCULAR ULTRASOUND/IVUS N/A 12/28/2021   Procedure: Intravascular Ultrasound/IVUS;  Surgeon: Jettie Booze, MD;  Location: [place] [facility];  Service: Cardiovascular;  Laterality: N/A;   LEFT HEART CATH AND CORONARY ANGIOGRAPHY N/A 12/28/2021   Procedure: LEFT HEART CATH AND CORONARY ANGIOGRAPHY;  Surgeon: Jettie Booze, MD;  Location: [place] [facility];  Service: Cardiovascular;  Laterality: N/A;   LITHOTRIPSY      NEPHROLITHOTOMY Left 03/17/2014   Procedure: NEPHROLITHOTOMY PERCUTANEOUS with access;  Surgeon: Alexis Frock, MD;  Location: [facility];  Service: Urology;  Laterality: Left;   NEPHROLITHOTOMY Left 03/19/2014   Procedure: 2ND STAGE NEPHROLITHOTOMY PERCUTANEOUS left ureteroscopy with lasr stone treatment and stent placement, with access;  Surgeon: Alexis Frock, MD;  Location: [facility];  Service: Urology;  Laterality: Left;   TOTAL KNEE ARTHROPLASTY Right 09/02/2014   Procedure: RIGHT TOTAL KNEE ARTHROPLASTY;  Surgeon: Meredith Pel, MD;  Location: [place];  Service: Orthopedics;  Laterality: Right;   uretral stent     Patient Active Problem List   Diagnosis Date Noted   Diabetic peripheral neuropathy associated with type 2 diabetes mellitus ([place]) 03/23/2022   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   Gait abnormality 03/07/2022   Cerebrovascular accident (CVA) ([place]) 03/07/2022   Myalgia due to statin 02/28/2022   Mild aortic stenosis 02/28/2022   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   Elevated CK 02/02/2022   Elevated LFTs 02/02/2022   CAD (coronary artery disease) 02/02/2022   Muscle weakness of all 4 extremities 02/02/2022   Hyperlipidemia 01/28/2022   Arthritis of knee 09/02/2014   Staghorn kidney stones 03/17/2014   Nephrolithiasis 02/01/2014   Pneumonia 08/27/2011   Diabetes mellitus with coincident hypertension ([place]) 08/27/2011   ONSET DATE: 12/27/21  REFERRING DIAG: M33.20 (ICD-10-CM) - Polymyositis, organ involvement unspecified   THERAPY DIAG:  Muscle weakness (generalized)  Other lack of coordination  Unsteadiness on feet  Right shoulder pain, unspecified chronicity  Left shoulder pain, unspecified chronicity  Rationale for Evaluation and Treatment Rehabilitation  PERTINENT HISTORY: Suspected polymyositis; non-ST-elevation myocardial infarction (NSTEMI) s/p percutaneous coronary intervention (PCI) and drug-eluting stent (DES) placement 12/28/21; incidental small silent brainstem  stroke (R pons infarct) identified per MRI; cervical spine neuroforaminal stenosis  R shoulder partial to near full-thickness supraspinatus tear; bilateral shoulder pain w/ R > L  Additional PMH: CAD, DMT2, HTN, dyslipidemia, nephrolithiasis, recent transaminitis, h/o stage I prostate cancer, and R TKA in 2016  PRECAUTIONS: Fall; gradual return to driving; don't lift over 10 lbs  SUBJECTIVE:  SUBJECTIVE STATEMENT: Pt reports he will be starting steroid infusions next week that will be on Mon-Thurs for 4 hours each day  PAIN: Are you having pain? 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 [employer] at the [employer]; travels for bands/festivals; yardwork  OBJECTIVE:   HAND DOMINANCE: Left  UPPER EXTREMITY ROM     Active ROM Right Eval - 5/25  6/27  7/6  Shoulder flexion 51 48 52  Shoulder abduction 50 68 63  Shoulder extension 40    Shoulder IR 80    Shoulder ER 40    Elbow flexion 135    Elbow extension 41 3   (Blank rows = not tested)  Active ROM Left Eval - 5/25  6/27  7/6  Shoulder flexion 55 79  96  Shoulder abduction 58 87   Shoulder extension 47    Shoulder IR 84    Shoulder ER 35    Elbow flexion 138    Elbow extension 6 4   (Blank rows = not tested)   TODAY'S TREATMENT - 04/11/22: Today was first aquatic therapy session.  Patient able to walk on pool deck without device with  shoes on with contact guard.  Patient's father brought him for therapy this session, although patient is driving now, so he should be able to bring himself to pool sessions.  Patient entered and exited the pool via stairs and two hand rails - wthout physical assistance and close supervision.  Session took place between 3.5-4.5 feet of warm water.  Patient reports less buoyancy when attempting to swim at his daughter's pool.   Started  session with water walking as warm up and to gauge comfort and balance with mild turbulence.  Worked in seated position on shoulder stretching with forward bend.  Followed with wall training with lightweight water dumbbells to address shoulder flex/ext, abd/add, elbow flex/ext, and cross body reaching.  Patient fatigued at 15 reps - will continue to work to increase resistance and repetitions/sets.   Modified plantigrade to address shoulder / UE loading - modified push up, and modified child's pose/cobra.  Patient having some difficulty maintaining feet on floor.  Continued to adjust position.   Ended with supine swimming -elementary backstroke - with floatation belt.  Patient thrilled at being able to safely swim on his back, and able to exercise to point of shortness of breath.  Patient recovered within 1 min with seated rest break.   Patient plans to have OT hybrid plan sharing visits between clinic and aquatic environments.    PATIENT EDUCATION: Ongoing condition-specific education related to therapeutic interventions completed this session Person educated: Patient Education method: Explanation Education comprehension: verbalized understanding   HOME EXERCISE PROGRAM: MedBridge Code: [id number] - Supine Shoulder Flexion AAROM with Hands Clasped  - 2 x daily - 1 sets - 10 reps - 5 sec hold - Shoulder Flexion Wall Slide with Towel  - 2 x daily - 7 x weekly - 2 sets - 5 reps - Seated Shoulder Flexion with Dowel to 90  - 2 x daily - 2-3 sets - 10-15 reps - Seated Shoulder Row with Anchored Resistance  - 2 x daily - 2-3 sets - 10-15 reps - Seated Shoulder External Rotation with Dumbbells  - 2 x daily - 2-3 sets - 10-15 reps - Seated Upper Trapezius Stretch  - 2 x daily - 7 x weekly - 1 sets - 25 reps - Gentle Levator Scapulae Stretch  - 2 x daily - 7 x weekly - 1 sets - 25 reps  GOALS: Goals reviewed with patient? Yes  SHORT TERM GOALS: Target date: 03/31/22  STG  Status:  1 Pt will verbalize  understanding of at least 3 fall prevention and/or energy conservation strategies to increase safety w/ functional mobility at home Baseline: Decreased knowledge of strategies Met - 02/24/22  2 Pt will demonstrate independence w/ recommended DME and/or AE (e.g., long-handle sponge, dressing stick, etc.) to improve participation and safety w/ ADLs Baseline: Decreased knowledge of AE Met - 03/31/22  3 Pt will improve functional use of BUEs as evidence by being able to achieve at least 70 degrees of shoulder flexion w/out pain Baseline: RUE: 51 deg, LUE: 55 deg Met - 03/31/22:  RUE: 79 deg,  LUE: 96 deg  4  Pt will improve functional bilateral reach by increasing R elbow extension to at least 25 degrees Baseline: 41* of flexion when attempting extension Met - 03/22/22; WFL    LONG TERM GOALS: Target date: 05/12/22  LTG  Status:  1 Pt will demonstrate independence w/ full HEP designed for ROM and strength of BUEs Baseline: No HEP at this time Progressing  2 Pt will improve QuickDASH score by at least 19 points to indicate mild functional impairment Baseline: 59.1 (moderate, borderline severe) impairment) Met - 04/07/22: 22.7  3 Pt will improve functional use of BUEs during grooming tasks (e.g., shampooing hair) as evidenced by improving strength of BUE shoulder flexion to at least 3+/5  Baseline: To be assessed Progressing  4 With both hands, pt will be able to grip w/ at least 20 lbs of force to improve functional use of BUEs during IADLs Baseline: Unable to assess at eval due to medical restrictions Met - 04/07/22: RUE 56 lbs, LUE 73 lbs  5 Pt will be able to retrieve lightweight object (< 5 lbs) from overhead height w/out pain by d/c Baseline: Unable to reach overhead Met - 04/07/22  6 Pt will be able to retrieve medium-weight object (~5 lbs) from both low and overhead heights by d/c Baseline:  Progressing    ASSESSMENT:  CLINICAL IMPRESSION: Pt completed his first aquatic therapy visit today.   Patient is very comfortable in the water, and was an avid swimmer prior to illness/hospitalization.  Patient extremely motivated for improved physical conditioning to improve ADL/IADL, voc skills.    PERFORMANCE DEFICITS in functional skills including ADLs, IADLs, ROM, strength, pain, GMC, mobility, balance, body mechanics, endurance, cardiopulmonary status limiting function, decreased knowledge of precautions, decreased knowledge of use of DME, and UE functional use.   IMPAIRMENTS are limiting patient from ADLs, IADLs, rest and sleep, work, leisure, and social participation.   COMORBIDITIES has co-morbidities such as NSTEMI, CAD, and DM  that affects occupational performance. Patient will benefit from skilled OT to address above impairments and improve overall function.   PLAN: OT FREQUENCY: 2x/week  OT DURATION: 10 weeks  PLANNED INTERVENTIONS: self care/ADL training, therapeutic exercise, therapeutic activity, manual therapy, passive range of motion, functional mobility training, aquatic therapy, moist heat, cryotherapy, patient/family education, energy conservation, DME and/or AE instructions, and Re-evaluation  RECOMMENDED OTHER SERVICES: Currently receiving PT services at this location  CONSULTED AND AGREED WITH PLAN OF CARE: Patient  PLAN FOR NEXT SESSION: Continue to focus on core and BUE strength (R more impacted than L) aquatics to address BUE strength and range of motion, core strength and overall conditioning   Antony Salmon,  OTR/L 04/11/2022, 5:33 PM

## 2022-04-12 ENCOUNTER — Ambulatory Visit: Payer: Medicare Other | Admitting: Occupational Therapy

## 2022-04-12 ENCOUNTER — Ambulatory Visit: Payer: Medicare Other

## 2022-04-12 DIAGNOSIS — M25511 Pain in right shoulder: Secondary | ICD-10-CM | POA: Diagnosis not present

## 2022-04-12 DIAGNOSIS — R278 Other lack of coordination: Secondary | ICD-10-CM | POA: Diagnosis not present

## 2022-04-12 DIAGNOSIS — M6281 Muscle weakness (generalized): Secondary | ICD-10-CM

## 2022-04-12 DIAGNOSIS — M3320 Polymyositis, organ involvement unspecified: Secondary | ICD-10-CM | POA: Diagnosis not present

## 2022-04-12 DIAGNOSIS — R262 Difficulty in walking, not elsewhere classified: Secondary | ICD-10-CM | POA: Diagnosis not present

## 2022-04-12 DIAGNOSIS — R2689 Other abnormalities of gait and mobility: Secondary | ICD-10-CM | POA: Diagnosis not present

## 2022-04-12 DIAGNOSIS — R2681 Unsteadiness on feet: Secondary | ICD-10-CM | POA: Diagnosis not present

## 2022-04-12 DIAGNOSIS — R296 Repeated falls: Secondary | ICD-10-CM | POA: Diagnosis not present

## 2022-04-12 DIAGNOSIS — M25512 Pain in left shoulder: Secondary | ICD-10-CM | POA: Diagnosis not present

## 2022-04-12 DIAGNOSIS — R293 Abnormal posture: Secondary | ICD-10-CM | POA: Diagnosis not present

## 2022-04-12 NOTE — Therapy (Signed)
OUTPATIENT OCCUPATIONAL THERAPY TREATMENT NOTE & PROGRESS NOTE   Patient Name: [name] MRN: [id number] DOB:[birth year], 66 y.o., male Today's Date: 04/12/2022  PCP: Greig Right, MD REFERRING PROVIDER: Cathlyn Parsons, PA-C   END OF SESSION:  OT End of Session - 04/12/22 1006     Visit Number 12    Number of Visits 21    Date for OT Re-Evaluation 05/12/22    Authorization Type BCBS Medicare    Authorization Time Period VL: [place]    OT Start Time 1004    OT Stop Time 1046    OT Time Calculation (min) 42 min    Activity Tolerance Patient tolerated treatment well    Behavior During Therapy WFL for tasks assessed/performed             Past Medical History:  Diagnosis Date   Arthritis    Diabetes mellitus    fasting 120s   History of kidney stones    Hypertension    Peripheral neuropathy    left fifth finger, left toe   Pneumonia    hx of   Past Surgical History:  Procedure Laterality Date   APPENDECTOMY     CORONARY STENT INTERVENTION N/A 12/28/2021   Procedure: CORONARY STENT INTERVENTION;  Surgeon: Jettie Booze, MD;  Location: [facility];  Service: Cardiovascular;  Laterality: N/A;   CYSTOSCOPY W/ URETERAL STENT PLACEMENT Bilateral 03/17/2014   Procedure: CYSTOSCOPY WITH RIGHT STENT PULL  ;  Surgeon: Alexis Frock, MD;  Location: [facility];  Service: Urology;  Laterality: Bilateral;   CYSTOSCOPY WITH RETROGRADE PYELOGRAM, URETEROSCOPY AND STENT PLACEMENT Bilateral 02/01/2014   Procedure: CYSTOSCOPY WITH BILATERAL RETROGRADE CYSTOSCOPY, BILATERAL RETROGRADE, PYELOGRAM, RIGHT URETEROSCOPY, STONE BASKETRY,  AND BILATERAL STENT PLACEMENT;  Surgeon: Alexis Frock, MD;  Location: [facility];  Service: Urology;  Laterality: Bilateral;   CYSTOSCOPY WITH URETEROSCOPY AND STENT PLACEMENT Left 02/12/2021   Procedure: CYSTOSCOPY WITH LEFT URETEROSCOPY, BALLOON DILITATION, HOLMIUM LASER LITHOTRIPSY AND STENT PLACEMENT;  Surgeon: Lucas Mallow, MD;  [facility];  Service: Urology;  Laterality: Left;   HOLMIUM LASER APPLICATION Left 04/04/6268   Procedure: HOLMIUM LASER APPLICATION;  Surgeon: Alexis Frock, MD;  [facility];  Service: Urology;  Laterality: Left;   INTRAVASCULAR ULTRASOUND/IVUS N/A 12/28/2021   Procedure: Intravascular Ultrasound/IVUS;  Surgeon: Jettie Booze, MD;  Location: [facility];  Service: Cardiovascular;  Laterality: N/A;   LEFT HEART CATH AND CORONARY ANGIOGRAPHY N/A 12/28/2021   Procedure: LEFT HEART CATH AND CORONARY ANGIOGRAPHY;  Surgeon: Jettie Booze, MD;  Location: [facility];  Service: Cardiovascular;  Laterality: N/A;   LITHOTRIPSY     NEPHROLITHOTOMY Left 03/17/2014   Procedure: NEPHROLITHOTOMY PERCUTANEOUS with access;  Surgeon: Alexis Frock, MD;  [facility];  Service: Urology;  Laterality: Left;   NEPHROLITHOTOMY Left 03/19/2014   Procedure: 2ND STAGE NEPHROLITHOTOMY PERCUTANEOUS left ureteroscopy with lasr stone treatment and stent placement, with access;  Surgeon: Alexis Frock, MD;  [facility];  Service: Urology;  Laterality: Left;   TOTAL KNEE ARTHROPLASTY Right 09/02/2014   Procedure: RIGHT TOTAL KNEE ARTHROPLASTY;  Surgeon: Meredith Pel, MD;  Location: [facility];  Service: Orthopedics;  Laterality: Right;   uretral stent     Patient Active Problem List   Diagnosis Date Noted   Diabetic peripheral neuropathy associated with type 2 diabetes mellitus ([place]) 03/23/2022   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   Gait abnormality 03/07/2022   Cerebrovascular accident (CVA) ([place]) 03/07/2022  Myalgia due to statin 02/28/2022   Mild aortic stenosis 02/28/2022   Polymyositis ([place]) 02/04/2022   Elevated CK 02/02/2022   Elevated LFTs 02/02/2022   CAD (coronary artery disease) 02/02/2022   Muscle weakness of all 4 extremities 02/02/2022   Hyperlipidemia 01/28/2022   Arthritis of knee 09/02/2014   Staghorn kidney stones 03/17/2014   Nephrolithiasis 02/01/2014   Pneumonia  08/27/2011   Diabetes mellitus with coincident hypertension ([place]) 08/27/2011   ONSET DATE: 12/27/21  REFERRING DIAG: M33.20 (ICD-10-CM) - Polymyositis, organ involvement unspecified   THERAPY DIAG:  Muscle weakness (generalized)  Right shoulder pain, unspecified chronicity  Left shoulder pain, unspecified chronicity  Other abnormalities of gait and mobility  Rationale for Evaluation and Treatment Rehabilitation  PERTINENT HISTORY: Suspected polymyositis; non-ST-elevation myocardial infarction (NSTEMI) s/p percutaneous coronary intervention (PCI) and drug-eluting stent (DES) placement 12/28/21; incidental small silent brainstem stroke (R pons infarct) identified per MRI; cervical spine neuroforaminal stenosis  R shoulder partial to near full-thickness supraspinatus tear; bilateral shoulder pain w/ R > L  Additional PMH: CAD, DMT2, HTN, dyslipidemia, nephrolithiasis, recent transaminitis, h/o stage I prostate cancer, and R TKA in 2016  PRECAUTIONS: Fall; gradual return to driving; don't lift over 10 lbs  SUBJECTIVE:  SUBJECTIVE STATEMENT: Pt reports starting aquatic OT yesterday and felt "really invigorated"  PAIN: Are you having pain? 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, chartered at the [employer]; travels for bands/festivals; yardwork  OBJECTIVE:   HAND DOMINANCE: Left  TODAY'S TREATMENT - 04/12/22: Reciprocal arm bike for conditioning and BUE strengthening on Level 2.5; 6 minutes total (forward and back)  Shoulder flexion glide w/ outer pipe over PVC dowel: 3x15 w/ RUE and OT providing min verbal cues to maintain elbow extension and decrease compensatory trunk movements 3x15 w/ LUE and OT; 1st set completed w/out weight, remaining sets completed w/ 1 lb wrist weight for increased challenge Overhead press w/ unweighted  dowel completed 3x5 w/ OT providing min assist on R side and verbal/tactile cues to decrease compensatory trunk extension Seated rows against anchored resistance (green theraband) completed 2x20 w/ OT providing tactile cues for emphasis on scapular retraction/depression and occ verbal cues to decrease compensatory L shoulder hiking Gentle soft tissue mobilization w/ lotion to bil upper traps, 4 min each side, in order to facilitate mobility within tissue structures and decrease tension for increased mobility; pt positioned sitting upright and tolerated w/out discomfort  *Rest breaks taken between all exercises and between sets prn due to decreased activity tolerance   PATIENT EDUCATION: Education provided regarding importance of decreasing compensatory shoulder/trunk movements during shoulder ROM and strength exercises Person educated: Patient Education method: Explanation Education comprehension: verbalized understanding   UPPER EXTREMITY ROM     Active ROM Right Eval - 5/25  6/27  7/6  Shoulder flexion 51 48 52  Shoulder abduction 50 68 63  Shoulder extension 40    Shoulder IR 80    Shoulder ER 40    Elbow flexion 135    Elbow extension 41 3   (Blank rows = not tested)  Active ROM Left Eval - 5/25  6/27  7/6  Shoulder flexion 55 79 96  Shoulder abduction 58 87   Shoulder extension 47    Shoulder IR 84    Shoulder ER 35    Elbow flexion 138    Elbow extension 6 4   (Blank  rows = not tested)   HOME EXERCISE PROGRAM: MedBridge Code: [id number] - Supine Shoulder Flexion AAROM with Hands Clasped  - 2 x daily - 1 sets - 10 reps - 5 sec hold - Shoulder Flexion Wall Slide with Towel  - 2 x daily - 7 x weekly - 2 sets - 5 reps - Seated Shoulder Flexion with Dowel to 90  - 2 x daily - 2-3 sets - 10-15 reps - Seated Shoulder Row with Anchored Resistance  - 2 x daily - 2-3 sets - 10-15 reps - Seated Shoulder External Rotation with Dumbbells  - 2 x daily - 2-3 sets - 10-15 reps -  Seated Upper Trapezius Stretch  - 2 x daily - 7 x weekly - 1 sets - 25 reps - Gentle Levator Scapulae Stretch  - 2 x daily - 7 x weekly - 1 sets - 25 reps  GOALS: Goals reviewed with patient? Yes  SHORT TERM GOALS: Target date: 03/31/22  STG  Status:  1 Pt will verbalize understanding of at least 3 fall prevention and/or energy conservation strategies to increase safety w/ functional mobility at home Baseline: Decreased knowledge of strategies Met - 02/24/22  2 Pt will demonstrate independence w/ recommended DME and/or AE (e.g., long-handle sponge, dressing stick, etc.) to improve participation and safety w/ ADLs Baseline: Decreased knowledge of AE Met - 03/31/22  3 Pt will improve functional use of BUEs as evidence by being able to achieve at least 70 degrees of shoulder flexion w/out pain Baseline: RUE: 51 deg, LUE: 55 deg Met - 03/31/22:  RUE: 79 deg,  LUE: 96 deg  4 Pt will improve functional bilateral reach by increasing R elbow extension to at least 25 degrees Baseline: 41* of flexion when attempting extension Met - 03/22/22; WFL    LONG TERM GOALS: Target date: 05/12/22  LTG  Status:  1 Pt will demonstrate independence w/ full HEP designed for ROM and strength of BUEs Baseline: No HEP at this time Progressing  2 Pt will improve QuickDASH score by at least 19 points to indicate mild functional impairment Baseline: 59.1 (moderate, borderline severe) impairment) Met - 04/07/22: 22.7  3 Pt will improve functional use of BUEs during grooming tasks (e.g., shampooing hair) as evidenced by improving strength of BUE shoulder flexion to at least 3+/5  Baseline: To be assessed Progressing  4 With both hands, pt will be able to grip w/ at least 20 lbs of force to improve functional use of BUEs during IADLs Baseline: Unable to assess at eval due to medical restrictions Met - 04/07/22: RUE 56 lbs, LUE 73 lbs  5 Pt will be able to retrieve lightweight object (< 5 lbs) from overhead height w/out pain by  d/c Baseline: Unable to reach overhead Met - 04/07/22  6 Pt will be able to retrieve medium-weight object (~5 lbs) from both low and overhead heights by d/c Baseline: Difficulty retrieving cast iron skillet from oven Progressing    ASSESSMENT:  CLINICAL IMPRESSION: Pt continues to make progress toward goals and is very motivated to improve w/ great carryover of HEP. OT continued to address BUE strengthening, focusing on active-assisted movement and preventing compensatory movement patterns. Pt continues to deny pain w/ only c/o persistent weakness. Pt will be taking a short time away from therapy therapy after this week to start infusions, but will return to OP OT/PT and aquatic OT. Will continue to address strength and review HEP prior to pt going on-hold until 04/28/22.  PERFORMANCE  DEFICITS in functional skills including ADLs, IADLs, ROM, strength, pain, GMC, mobility, balance, body mechanics, endurance, cardiopulmonary status limiting function, decreased knowledge of precautions, decreased knowledge of use of DME, and UE functional use.   IMPAIRMENTS are limiting patient from ADLs, IADLs, rest and sleep, work, leisure, and social participation.   COMORBIDITIES has co-morbidities such as NSTEMI, CAD, and DM  that affects occupational performance. Patient will benefit from skilled OT to address above impairments and improve overall function.   PLAN: OT FREQUENCY: 2x/week  OT DURATION: 10 weeks  PLANNED INTERVENTIONS: self care/ADL training, therapeutic exercise, therapeutic activity, manual therapy, passive range of motion, functional mobility training, aquatic therapy, moist heat, cryotherapy, patient/family education, energy conservation, DME and/or AE instructions, and Re-evaluation  RECOMMENDED OTHER SERVICES: Currently receiving PT services at this location  CONSULTED AND AGREED WITH PLAN OF CARE: Patient  PLAN FOR NEXT SESSION: Continue to focus on core and BUE strength (R more  impacted than L)   Kathrine Cords, MSOT, OTR/L 04/12/2022, 10:53 AM

## 2022-04-13 ENCOUNTER — Encounter: Payer: Medicare Other | Admitting: Neurology

## 2022-04-13 NOTE — Therapy (Signed)
OUTPATIENT PHYSICAL THERAPY LOWER EXTREMITY TREATMENT   Patient Name: [name] MRN: [id number] DOB:1955/12/25, 66 y.o., male Today's Date: 04/14/2022   PT End of Session - 04/14/22 1104     Visit Number 13    PT Start Time 1100    PT Stop Time 1143    PT Time Calculation (min) 43 min    Activity Tolerance Patient limited by fatigue;Patient tolerated treatment well    Behavior During Therapy WFL for tasks assessed/performed                 Past Medical History:  Diagnosis Date   Arthritis    Diabetes mellitus    fasting 120s   History of kidney stones    Hypertension    Peripheral neuropathy    left fifth finger, left toe   Pneumonia    hx of   Past Surgical History:  Procedure Laterality Date   APPENDECTOMY     CORONARY STENT INTERVENTION N/A 12/28/2021   Procedure: CORONARY STENT INTERVENTION;  Surgeon: Jettie Booze, MD;  Location: [facility];  Service: Cardiovascular;  Laterality: N/A;   CYSTOSCOPY W/ URETERAL STENT PLACEMENT Bilateral 03/17/2014   Procedure: CYSTOSCOPY WITH RIGHT STENT PULL  ;  Surgeon: Alexis Frock, MD;  [facility];  Service: Urology;  Laterality: Bilateral;   CYSTOSCOPY WITH RETROGRADE PYELOGRAM, URETEROSCOPY AND STENT PLACEMENT Bilateral 02/01/2014   Procedure: CYSTOSCOPY WITH BILATERAL RETROGRADE CYSTOSCOPY, BILATERAL RETROGRADE, PYELOGRAM, RIGHT URETEROSCOPY, STONE BASKETRY,  AND BILATERAL STENT PLACEMENT;  Surgeon: Alexis Frock, MD;  [facility];  Service: Urology;  Laterality: Bilateral;   CYSTOSCOPY WITH URETEROSCOPY AND STENT PLACEMENT Left 02/12/2021   Procedure: CYSTOSCOPY WITH LEFT URETEROSCOPY, BALLOON DILITATION, HOLMIUM LASER LITHOTRIPSY AND STENT PLACEMENT;  Surgeon: Lucas Mallow, MD;  [facility];  Service: Urology;  Laterality: Left;   HOLMIUM LASER APPLICATION Left 2/35/5732   Procedure: HOLMIUM LASER APPLICATION;  Surgeon: Alexis Frock, MD;  [facility];  Service: Urology;   Laterality: Left;   INTRAVASCULAR ULTRASOUND/IVUS N/A 12/28/2021   Procedure: Intravascular Ultrasound/IVUS;  Surgeon: Jettie Booze, MD;  Location: [facility];  Service: Cardiovascular;  Laterality: N/A;   LEFT HEART CATH AND CORONARY ANGIOGRAPHY N/A 12/28/2021   Procedure: LEFT HEART CATH AND CORONARY ANGIOGRAPHY;  Surgeon: Jettie Booze, MD;  Location: [facility];  Service: Cardiovascular;  Laterality: N/A;   LITHOTRIPSY     NEPHROLITHOTOMY Left 03/17/2014   Procedure: NEPHROLITHOTOMY PERCUTANEOUS with access;  Surgeon: Alexis Frock, MD;  [facility];  Service: Urology;  Laterality: Left;   NEPHROLITHOTOMY Left 03/19/2014   Procedure: 2ND STAGE NEPHROLITHOTOMY PERCUTANEOUS left ureteroscopy with lasr stone treatment and stent placement, with access;  Surgeon: Alexis Frock, MD;  [facility];  Service: Urology;  Laterality: Left;   TOTAL KNEE ARTHROPLASTY Right 09/02/2014   Procedure: RIGHT TOTAL KNEE ARTHROPLASTY;  Surgeon: Meredith Pel, MD;  Location: [facility];  Service: Orthopedics;  Laterality: Right;   uretral stent     Patient Active Problem List   Diagnosis Date Noted   Diabetic peripheral neuropathy associated with type 2 diabetes mellitus ([place]) 03/23/2022   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   Gait abnormality 03/07/2022   Cerebrovascular accident (CVA) ([place]) 03/07/2022   Myalgia due to statin 02/28/2022   Mild aortic stenosis 02/28/2022   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   Elevated CK 02/02/2022   Elevated LFTs 02/02/2022   CAD (coronary artery disease) 02/02/2022   Muscle weakness of all  4 extremities 02/02/2022   Hyperlipidemia 01/28/2022   Arthritis of knee 09/02/2014   Staghorn kidney stones 03/17/2014   Nephrolithiasis 02/01/2014   Pneumonia 08/27/2011   Diabetes mellitus with coincident hypertension ([place]) 08/27/2011    PCP: Greig Right, MD  REFERRING PROVIDER: Cathlyn Parsons, PA-C   REFERRING DIAG: M33.20 (ICD-10-CM) -  Polymyositis, organ involvement unspecified   THERAPY DIAG:  Muscle weakness (generalized)  Other lack of coordination  Other abnormalities of gait and mobility  Unsteadiness on feet  Difficulty in walking, not elsewhere classified  Polymyositis ([place])  Abnormal posture  Rationale for Evaluation and Treatment Rehabilitation  ONSET DATE: 02/15/2022   SUBJECTIVE:   SUBJECTIVE STATEMENT:  Sore after aquatic therapy on Monday. Doing well walking without a cane.   PERTINENT HISTORY: [name] is a 66 y.o. right-handed male with history of type 2 diabetes mellitus hypertension intolerance of statin due to abnormal LFTs (off for the past years), CAD status post DES to RCA 12/28/2021 (addition of arterial for statin 20 mg, aspirin/Brilinta),   PAIN:  Are you having pain? No  PRECAUTIONS: None  WEIGHT BEARING RESTRICTIONS No  FALLS:  Has patient fallen in last 6 months? Yes. Number of falls 5  LIVING ENVIRONMENT: Lives with: lives with their family and lives alone Lives in: House/apartment Stairs: No Has following equipment at home: Gilford Rile - 4 wheeled, shower chair, Grab bars, and None  OCCUPATION: Comptroller, office work, Government social research officer.  PLOF: Independent  PATIENT GOALS Increase strength, return to his active lifestyle. 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 Travels to attend concerts.   OBJECTIVE:   MUSCLE LENGTH: Hamstrings: Right 62 deg; Left 68 deg   POSTURE: rounded shoulders   LOWER EXTREMITY ROM: WFL throughout, R hip with mild     tightness.   LOWER EXTREMITY MMT:  MMT Right eval Left eval Right 03/31/22 Left 03/31/22  Hip flexion 4- 3+ 4- 4-  Hip extension 4- 3+ 4- 3+  Hip abduction 4- 3+ 4- 3+  Hip adduction      Hip internal rotation      Hip external rotation      Knee flexion 4 4- 4 4  Knee extension 4 4- 4 4  Ankle dorsiflexion 4 4    Ankle plantarflexion      Ankle inversion      Ankle  eversion       (Blank rows = not tested)   FUNCTIONAL TESTS:    5x STS18.22 BERG 43/56   TODAY'S TREATMENT: 04/14/22 Nustep L5 x19mins Knee ext 20# 3x10  HS curls 15# 3x10 Pallof press 5# 2x10  Calf raises on airex 2x12 BlackTB crunches 2x12  Leg press 40# 3x10   04/07/22 Bike L5 x31mins Leg press 40# 2x10  Resisted gait 15# 5x forwards and sidesteps Hip ext 5# 2x10 each side  Crunches w/blackTB 2x15 Back ext black TB 2x15 Gastroc stretch on slant 68min   04/05/22 Nustep L5 x84mins Bike L4 x5 mins  Walking up and down incline outside x2 Calf raises 2x10 Knee ext 20# 3x10 Knee flex 15# 3x10 Step ups 6" 2x10   03/30/22 Nustep L5 x61mins 5xSTS (unable to complete 5, stopped at 4 in 20s)  BERG (44/56) Walking 10 laps w/o cane  Cybex flex 15# 2x15 Cybex ext 20# 2x12  03/22/22 Nustep L4 x76mins STS on Airex 2x10 Walking outside on grass Walking down and up hill  Cybex knee flex 25# 2x10 Cybex knee ext 25# 2x10  03/17/22 Bike L4, 12mins Resisted gait 15# forwards and sideways x5 Hip abd and ext 3# 2x10 Side steps on foam Step overs 4" Walking without cane 3 laps   03/15/22 Nustep L5, 46mins UBE forwards/back 6 mins STS elevated table holding red ball 2x10 Crunches with black band 3x10 Step ups forwards and sideways 20reps 4" Standing on foam feet together catch Tandem on foam 30s    03/10/22 Nustep L5, 3mins  Shoulder flexion w/dowel 2x10  Bicep curls 4# 2x10 Walking without cane 3 laps CGA Step ups on foam CGA, 2 LOB  Standing on foam playing catch minA  2 way hip ext/abd 2x10       PATIENT EDUCATION:  Education details: POC Person educated: Patient Education method: Explanation, Corporate treasurer cues, and Verbal cues Education comprehension: verbalized understanding, returned demonstration, verbal cues required, and tactile cues required    Patent educated about keeping lights on during the night for safety and to prevent future falls. Also educated about  collection of HEP, and to not do all exercises but rather pick a few to do in order to avoid fatigue.  HOME EXERCISE PROGRAM: [id number]  ASSESSMENT:  CLINICAL IMPRESSION: Patient is walking without a cane but still walks with ataxic gait pattern. We continued with LE strengthening with more reps and sets and higher weights. He is able to tolerate session with some fatigue and additional rest breaks.     OBJECTIVE IMPAIRMENTS Abnormal gait, decreased activity tolerance, decreased balance, decreased coordination, decreased endurance, decreased mobility, difficulty walking, decreased strength, improper body mechanics, and postural dysfunction.   ACTIVITY LIMITATIONS carrying, lifting, bending, standing, squatting, stairs, transfers, bed mobility, and locomotion level  PARTICIPATION LIMITATIONS: meal prep, cleaning, laundry, driving, shopping, community activity, occupation, and yard work  PERSONAL FACTORS Past/current experiences are also affecting patient's functional outcome.   REHAB POTENTIAL: Good  CLINICAL DECISION MAKING: Evolving/moderate complexity  EVALUATION COMPLEXITY: Moderate   GOALS: Goals reviewed with patient? Yes  SHORT TERM GOALS: Target date: 03/17/2022   I with basic HEP Baseline: Goal status: MET    LONG TERM GOALS: Target date: 05/12/2022   I with final HEP Baseline:  Goal status: INITIAL  2.  Patient will score at least 50/56 on BERG Balance Exam Baseline: 43, 76/23 (44) Goal status: IN PROGRESS  3.  Paitent will complete 5x sit to stand in < 12 sec to demonstrate improved strength in BLE. Baseline:  Goal status: IN PROGRESS  4.  Patient will ambulate at least 1000' with LRAD, MI, on level and unlevel surfaces Baseline: 03/31/22 able to walk 1000' w/o AD on even surfaces, CGA due to LOB with fatigue Goal status: IN PROGRESS  5.  Patient will be able to manage cooking and cleaning activities in his home without falling and without requiring a rest  break. Baseline:  Goal status: IN PROGRESS   PLAN: PT FREQUENCY: 2x/week  PT DURATION: 12 weeks  PLANNED INTERVENTIONS: Therapeutic exercises, Therapeutic activity, Neuromuscular re-education, Balance training, Gait training, Patient/Family education, Joint mobilization, Stair training, and Manual therapy  PLAN FOR NEXT SESSION: gait and balance activities, core strengthening   Andris Baumann, DPT 04/14/2022, 11:46 AM

## 2022-04-14 ENCOUNTER — Ambulatory Visit: Payer: Medicare Other | Admitting: Occupational Therapy

## 2022-04-14 ENCOUNTER — Ambulatory Visit: Payer: Medicare Other

## 2022-04-14 ENCOUNTER — Encounter: Payer: Self-pay | Admitting: Occupational Therapy

## 2022-04-14 DIAGNOSIS — R2689 Other abnormalities of gait and mobility: Secondary | ICD-10-CM | POA: Diagnosis not present

## 2022-04-14 DIAGNOSIS — R2681 Unsteadiness on feet: Secondary | ICD-10-CM

## 2022-04-14 DIAGNOSIS — R262 Difficulty in walking, not elsewhere classified: Secondary | ICD-10-CM | POA: Diagnosis not present

## 2022-04-14 DIAGNOSIS — R278 Other lack of coordination: Secondary | ICD-10-CM | POA: Diagnosis not present

## 2022-04-14 DIAGNOSIS — R293 Abnormal posture: Secondary | ICD-10-CM | POA: Diagnosis not present

## 2022-04-14 DIAGNOSIS — M25512 Pain in left shoulder: Secondary | ICD-10-CM | POA: Diagnosis not present

## 2022-04-14 DIAGNOSIS — R296 Repeated falls: Secondary | ICD-10-CM | POA: Diagnosis not present

## 2022-04-14 DIAGNOSIS — M6281 Muscle weakness (generalized): Secondary | ICD-10-CM | POA: Diagnosis not present

## 2022-04-14 DIAGNOSIS — M25511 Pain in right shoulder: Secondary | ICD-10-CM

## 2022-04-14 DIAGNOSIS — M3320 Polymyositis, organ involvement unspecified: Secondary | ICD-10-CM | POA: Diagnosis not present

## 2022-04-14 NOTE — Therapy (Signed)
OUTPATIENT OCCUPATIONAL THERAPY TREATMENT NOTE   Patient Name: [name] MRN: [id number] DOB:November 12, 1955, 66 y.o., male Today's Date: 04/14/2022  PCP: Greig Right, MD REFERRING PROVIDER: Cathlyn Parsons, PA-C   END OF SESSION:   OT End of Session - 04/14/22 1022     Visit Number 13    Number of Visits 21    Date for OT Re-Evaluation 05/12/22    Authorization Type BCBS Medicare    Authorization Time Period VL: [place]    OT Start Time 1015    OT Stop Time 1055    OT Time Calculation (min) 40 min    Activity Tolerance Patient tolerated treatment well    Behavior During Therapy WFL for tasks assessed/performed            Past Medical History:  Diagnosis Date   Arthritis    Diabetes mellitus    fasting 120s   History of kidney stones    Hypertension    Peripheral neuropathy    left fifth finger, left toe   Pneumonia    hx of   Past Surgical History:  Procedure Laterality Date   APPENDECTOMY     CORONARY STENT INTERVENTION N/A 12/28/2021   Procedure: CORONARY STENT INTERVENTION;  Surgeon: Jettie Booze, MD;  Location: [facility];  Service: Cardiovascular;  Laterality: N/A;   CYSTOSCOPY W/ URETERAL STENT PLACEMENT Bilateral 03/17/2014   Procedure: CYSTOSCOPY WITH RIGHT STENT PULL  ;  Surgeon: Alexis Frock, MD;  Location: [facility];  Service: Urology;  Laterality: Bilateral;   CYSTOSCOPY WITH RETROGRADE PYELOGRAM, URETEROSCOPY AND STENT PLACEMENT Bilateral 02/01/2014   Procedure: CYSTOSCOPY WITH BILATERAL RETROGRADE CYSTOSCOPY, BILATERAL RETROGRADE, PYELOGRAM, RIGHT URETEROSCOPY, STONE BASKETRY,  AND BILATERAL STENT PLACEMENT;  Surgeon: Alexis Frock, MD;  Location: [facility];  Service: Urology;  Laterality: Bilateral;   CYSTOSCOPY WITH URETEROSCOPY AND STENT PLACEMENT Left 02/12/2021   Procedure: CYSTOSCOPY WITH LEFT URETEROSCOPY, BALLOON DILITATION, HOLMIUM LASER LITHOTRIPSY AND STENT PLACEMENT;  Surgeon: Lucas Mallow, MD;  Location: [facility];  Service:  Urology;  Laterality: Left;   HOLMIUM LASER APPLICATION Left 8/00/3491   Procedure: HOLMIUM LASER APPLICATION;  Surgeon: Alexis Frock, MD;  Location: [facility];  Service: Urology;  Laterality: Left;   INTRAVASCULAR ULTRASOUND/IVUS N/A 12/28/2021   Procedure: Intravascular Ultrasound/IVUS;  Surgeon: Jettie Booze, MD;  Location: [facility];  Service: Cardiovascular;  Laterality: N/A;   LEFT HEART CATH AND CORONARY ANGIOGRAPHY N/A 12/28/2021   Procedure: LEFT HEART CATH AND CORONARY ANGIOGRAPHY;  Surgeon: Jettie Booze, MD;  Location: [facility];  Service: Cardiovascular;  Laterality: N/A;   LITHOTRIPSY     NEPHROLITHOTOMY Left 03/17/2014   Procedure: NEPHROLITHOTOMY PERCUTANEOUS with access;  Surgeon: Alexis Frock, MD;  Location: [facility];  Service: Urology;  Laterality: Left;   NEPHROLITHOTOMY Left 03/19/2014   Procedure: 2ND STAGE NEPHROLITHOTOMY PERCUTANEOUS left ureteroscopy with lasr stone treatment and stent placement, with access;  Surgeon: Alexis Frock, MD;  Location: [facility];  Service: Urology;  Laterality: Left;   TOTAL KNEE ARTHROPLASTY Right 09/02/2014   Procedure: RIGHT TOTAL KNEE ARTHROPLASTY;  Surgeon: Meredith Pel, MD;  Location: [facility];  Service: Orthopedics;  Laterality: Right;   uretral stent     Patient Active Problem List   Diagnosis Date Noted   Diabetic peripheral neuropathy associated with type 2 diabetes mellitus ([place]) 03/23/2022   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   Gait abnormality 03/07/2022   Cerebrovascular accident (CVA) ([place]) 03/07/2022   Myalgia due  to statin 02/28/2022   Mild aortic stenosis 02/28/2022   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   Elevated CK 02/02/2022   Elevated LFTs 02/02/2022   CAD (coronary artery disease) 02/02/2022   Muscle weakness of all 4 extremities 02/02/2022   Hyperlipidemia 01/28/2022   Arthritis of knee 09/02/2014   Staghorn kidney stones 03/17/2014   Nephrolithiasis 02/01/2014   Pneumonia 08/27/2011    Diabetes mellitus with coincident hypertension ([place]) 08/27/2011   ONSET DATE: 12/27/21  REFERRING DIAG: M33.20 (ICD-10-CM) - Polymyositis, organ involvement unspecified   THERAPY DIAG:  Muscle weakness (generalized)  Right shoulder pain, unspecified chronicity  Left shoulder pain, unspecified chronicity  Other abnormalities of gait and mobility  Rationale for Evaluation and Treatment Rehabilitation  PERTINENT HISTORY: Suspected polymyositis; non-ST-elevation myocardial infarction (NSTEMI) s/p percutaneous coronary intervention (PCI) and drug-eluting stent (DES) placement 12/28/21; incidental small silent brainstem stroke (R pons infarct) identified per MRI; cervical spine neuroforaminal stenosis  R shoulder partial to near full-thickness supraspinatus tear; bilateral shoulder pain w/ R > L  Additional PMH: CAD, DMT2, HTN, dyslipidemia, nephrolithiasis, recent transaminitis, h/o stage I prostate cancer, and R TKA in 2016  PRECAUTIONS: Fall; gradual return to driving; don't lift over 10 lbs  SUBJECTIVE:  SUBJECTIVE STATEMENT: "I stay tired;" pt does report he feels less fatigued than he did on Tuesday  PAIN: Are you having pain? 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, chartered at the [employer]; travels for bands/festivals; yardwork  OBJECTIVE:   HAND DOMINANCE: Left  TODAY'S TREATMENT - 04/14/22: Reciprocal arm bike for conditioning and BUE strengthening on Level 2.5; 6 minutes total (forward and back) Overhead press w/ unweighted dowel completed 2x5 w/ OT providing min assist on R side w/ increased repetition and verbal cues prn to decrease compensatory trunk extension Standing wall pushups w/ arms at shoulder height 2x10 w/ CGA; pt demonstrated good alignment throughout w/ occ verbal cues to decreased compensatory  head/shoulder movement Trunk/back extension: anterior weight shift to tap 4.4 med ball at floor level and returning to seated position for core strengthening; 3x5 w/ verbal cues to decrease compensating w/ BLE when extending trunk back to upright Picking up 1" blocks w/ power grip exerciser alternating using L/R hand and placing in a stack to facilitate functional grasp/release, hand strengthening, and forward reach; pt completed 2x10 blocks w/ min drops w/ L hand Gentle soft tissue mobilization w/ lotion to bil upper traps, approx 4 min each side, in order to facilitate mobility within tissue structures and decrease tension for increased mobility; pt positioned sitting upright and tolerated w/out discomfort *Rest breaks taken between all exercises and between sets prn due to decreased activity tolerance   PATIENT EDUCATION: Education provided regarding importance of decreasing compensatory shoulder/trunk movements during shoulder ROM and strength exercises Person educated: Patient Education method: Explanation Education comprehension: verbalized understanding   UPPER EXTREMITY ROM     Active ROM Right Eval - 5/25  6/27  7/6  Shoulder flexion 51 48 52  Shoulder abduction 50 68 63  Shoulder extension 40    Shoulder IR 80    Shoulder ER 40    Elbow flexion 135    Elbow extension 41 3   (Blank rows = not tested)  Active ROM Left Eval - 5/25  6/27  7/6  Shoulder flexion 55 79 96  Shoulder abduction 58 87   Shoulder extension 47  Shoulder IR 84    Shoulder ER 35    Elbow flexion 138    Elbow extension 6 4   (Blank rows = not tested)   HOME EXERCISE PROGRAM: MedBridge Code: [id number] - Supine Shoulder Flexion AAROM with Hands Clasped  - 2 x daily - 1 sets - 10 reps - 5 sec hold - Shoulder Flexion Wall Slide with Towel  - 2 x daily - 7 x weekly - 2 sets - 5 reps - Seated Shoulder Flexion with Dowel to 90  - 2 x daily - 2-3 sets - 10-15 reps - Seated Shoulder Row with  Anchored Resistance  - 2 x daily - 2-3 sets - 10-15 reps - Seated Shoulder External Rotation with Dumbbells  - 2 x daily - 2-3 sets - 10-15 reps - Seated Upper Trapezius Stretch  - 2 x daily - 7 x weekly - 1 sets - 25 reps - Gentle Levator Scapulae Stretch  - 2 x daily - 7 x weekly - 1 sets - 25 reps  GOALS: Goals reviewed with patient? Yes  SHORT TERM GOALS: Target date: 03/31/22  STG  Status:  1 Pt will verbalize understanding of at least 3 fall prevention and/or energy conservation strategies to increase safety w/ functional mobility at home Baseline: Decreased knowledge of strategies Met - 02/24/22  2 Pt will demonstrate independence w/ recommended DME and/or AE (e.g., long-handle sponge, dressing stick, etc.) to improve participation and safety w/ ADLs Baseline: Decreased knowledge of AE Met - 03/31/22  3 Pt will improve functional use of BUEs as evidence by being able to achieve at least 70 degrees of shoulder flexion w/out pain Baseline: RUE: 51 deg, LUE: 55 deg Met - 03/31/22:  RUE: 79 deg,  LUE: 96 deg  4 Pt will improve functional bilateral reach by increasing R elbow extension to at least 25 degrees Baseline: 41* of flexion when attempting extension Met - 03/22/22; WFL    LONG TERM GOALS: Target date: 05/12/22  LTG  Status:  1 Pt will demonstrate independence w/ full HEP designed for ROM and strength of BUEs Baseline: No HEP at this time Progressing  2 Pt will improve QuickDASH score by at least 19 points to indicate mild functional impairment Baseline: 59.1 (moderate, borderline severe) impairment) Met - 04/07/22: 22.7  3 Pt will improve functional use of BUEs during grooming tasks (e.g., shampooing hair) as evidenced by improving strength of BUE shoulder flexion to at least 3+/5  Baseline: To be assessed Progressing  4 With both hands, pt will be able to grip w/ at least 20 lbs of force to improve functional use of BUEs during IADLs Baseline: Unable to assess at eval due to medical  restrictions Met - 04/07/22: RUE 56 lbs, LUE 73 lbs  5 Pt will be able to retrieve lightweight object (< 5 lbs) from overhead height w/out pain by d/c Baseline: Unable to reach overhead Met - 04/07/22  6 Pt will be able to retrieve medium-weight object (~5 lbs) from both low and overhead heights by d/c Baseline: Difficulty retrieving cast iron skillet from oven Progressing    ASSESSMENT:  CLINICAL IMPRESSION: Pt continues to make progress toward goals and is very motivated to improve w/ great carryover of HEP. OT continued to address BUE and core strengthening strengthening, focusing on active-assisted movement and preventing compensatory movement patterns. Pt will be taking a short time away from therapy therapy after this week to start infusions, but will return to OP OT/PT and aquatic  OT. Will continue to address strength and review HEP prior to pt going on-hold until 04/28/22.  PERFORMANCE DEFICITS in functional skills including ADLs, IADLs, ROM, strength, pain, GMC, mobility, balance, body mechanics, endurance, cardiopulmonary status limiting function, decreased knowledge of precautions, decreased knowledge of use of DME, and UE functional use.   IMPAIRMENTS are limiting patient from ADLs, IADLs, rest and sleep, work, leisure, and social participation.   COMORBIDITIES has co-morbidities such as NSTEMI, CAD, and DM  that affects occupational performance. Patient will benefit from skilled OT to address above impairments and improve overall function.   PLAN: OT FREQUENCY: 2x/week  OT DURATION: 10 weeks  PLANNED INTERVENTIONS: self care/ADL training, therapeutic exercise, therapeutic activity, manual therapy, passive range of motion, functional mobility training, aquatic therapy, moist heat, cryotherapy, patient/family education, energy conservation, DME and/or AE instructions, and Re-evaluation  RECOMMENDED OTHER SERVICES: Currently receiving PT services at this location  CONSULTED AND  AGREED WITH PLAN OF CARE: Patient  PLAN FOR NEXT SESSION: Reassess ROM/MMT, progress toward goals; Continue to focus on core and BUE strength (R more impacted than L)   Kathrine Cords, MSOT, OTR/L 04/14/2022, 10:55 AM

## 2022-04-15 DIAGNOSIS — E119 Type 2 diabetes mellitus without complications: Secondary | ICD-10-CM | POA: Diagnosis not present

## 2022-04-18 ENCOUNTER — Other Ambulatory Visit: Payer: Self-pay

## 2022-04-18 ENCOUNTER — Ambulatory Visit: Payer: Medicare Other | Admitting: Occupational Therapy

## 2022-04-18 ENCOUNTER — Encounter (HOSPITAL_COMMUNITY): Payer: Self-pay | Admitting: Surgery

## 2022-04-18 NOTE — Progress Notes (Addendum)
Anesthesia Chart Review: [name]  Case: [id number] Date/Time: 04/19/22 1545   Procedure: LEFT THIGH MUSCLE BIOPSY (Left)   Anesthesia type: Monitor Anesthesia Care   Pre-op diagnosis: LEFT THIGH MUSCLE WEAKNESS   Location: [place] OR ROOM 02 / [place] [facility]   Surgeons: Erroll Luna, MD       DISCUSSION: Patient is a 66 year old male scheduled for the above procedure.  History includes never smoker, CAD (s/p NSTEMI s/p DES [facility] 12/28/21), aortic stenosis (mild AS 02/04/22), HTN, HLD, DM2.   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 Muscle biopsy could not be done that admission due to DES RCA on 12/28/21, so would need to wait at least 3 months before DAPT could be temporarily held. Of note, brain MRI showed acute infarct right pons--neurology felt this was more of an incidental finding and not related to his presentation. No severe ICA disease. He was discharged to [facility] for rehab 02/08/22-02/16/22.   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 Has been on steroids, but not an ideal candidate for long-term prednisone due to suboptimal DM control with recent A1c 8.1%. Started methotrexate weekly on 03/07/22. Preauthorization for IVIG done. AChR binding and modulating Ab were negative. Has "Profound neck flexion extension, upper and lower extremity proximal muscle weakness". Last visit 03/23/22 and was awaiting muscle biopsy. He reported plans for "steroid" infusion on 04/19/22 prior to arrival for muscle biopsy, although may be IVIG infusion. He was advised to notify surgeon.   Followed by cardiologist Dr. Whitney Post. Last visit 02/28/22 for follow-up CAD, s/p DES RCA 12/2021. Noted recent history of transminitis and statin myalgia, now off statin and on Repatha. Noted ED visit 02/19/22 for fall and hitting head. Seeing PT for weakness. Noted he is "asymptomatic  with stable  angina on therapy". On ASA 81 mg and planning to stop Ticagrelor and transition to Plavix. He noted plans for July muscle biopsy and wrote, "will send copy of our note to patient's neurologist once he knows who that is; planned for July muscle biopsy for weakness (can hold anti-platelets as needed)". (Per [facility], patient to temporarily hold Plavix for 5 days prior to surgery, last dose 04/14/22. He said he was instructed to continue ASA.) Plan for cardiology follow-up in the Fall.   He is a same day work-up. He is for updated labs and anesthesia team to evaluate on the day of surgery. He is on daily prednisone currently. Case is posted for MAC anesthesia.   VS:  BP Readings from Last 3 Encounters:  03/23/22 (!) 152/66  03/23/22 (!) 152/66  03/17/22 116/77   Pulse Readings from Last 3 Encounters:  03/23/22 83  03/23/22 83  03/17/22 95     PROVIDERS: Greig Right, MD is PCP  Rudean Haskell, MD is cardiologist Marcial Pacas, MD is neurologist Alysia Penna, MD is PM&R   LABS: Labs as indicated on arrival. Total CK 2,314, ESR 35, TSH 0.492, A1c 8.1% on 03/07/22. Cr 0.49, WBC 21.7, H/H 13.7/39.3, PLT 292 on 02/14/22. He reported home fasting CBGs in the low 100's.   IMAGES: CT Head 02/19/22: IMPRESSION: No acute intracranial abnormality. 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   CTA Head/Neck 02/04/22: IMPRESSION: 1. No emergent finding.  Extensive atherosclerosis. 2. Right ICA stenosis measuring at least 70% proximally and 70% at the paraclinoid segment. 3. 50% proximal left ICA stenosis.  4. Bilateral vertebral origin stenosis measuring 50% on the right and 40% on the left.   MRI C-spine 02/03/22: IMPRESSION: 1. At C3-C4, there is grade 1 anterolisthesis, left worse than right uncovertebral ridging, and facet arthropathy with a small left effusion resulting in severe left and mild right neural foraminal stenosis. 2. At C5-C6,  there is right worse than left uncovertebral ridging, bilateral facet arthropathy, and a prominent right paracentral osteophyte/protrusion resulting in severe right neural foraminal stenosis with likely impingement of the exiting C6 nerve root, and mild-to-moderate left neural foraminal stenosis. 3. Severe left and moderate right neural foraminal stenosis at C6-C7.  MRI T/L Spine 02/03/22: IMPRESSION: MRI THORACIC SPINE IMPRESSION: 1. Normal MRI appearance of the thoracic spinal cord. No findings to explain patient's symptoms. 2. Small central disc protrusions at T7-8, T8-9, and T9-10 without significant stenosis.   MRI LUMBAR SPINE IMPRESSION: 1. Normal MRI appearance of the conus medullaris and nerve roots of the cauda equina. No evidence for cord compression or other findings to explain patient's symptoms. 2. Left eccentric disc bulge with facet hypertrophy at L2-3 and L3-4 with resultant mild to moderate left lateral recess and foraminal stenosis. 3. Right eccentric disc bulge with facet hypertrophy at L4-5 with resultant mild to moderate right lateral recess and foraminal stenosis. 4. Shallow central to left subarticular disc protrusion at L5-S1, closely approximating and potentially irritating the descending left S1 nerve root.   MRI Brain 02/03/22: IMPRESSION: 1. Acute right pontine infarct. 2. Moderate chronic microvascular ischemic disease.   CT Chest/Abd/Pelvis 02/02/22: IMPRESSION: - No evidence of metastatic disease within the chest, abdomen, and pelvis. No acute intrathoracic or intra-abdominal pathology identified. - Extensive coronary artery calcification. Aortic valve leaflet calcification noted. Echocardiography may be more helpful to assess the degree of valvular dysfunction. - 6 mm right upper lobe pulmonary nodule, stable since remote prior examination and safely considered benign. - Moderate nonobstructing left nephrolithiasis with decreased  stone burden since prior examination of 05/05/2015. Moderate cortical atrophy. Small left perinephric fluid collection, possibly the residua of prior intervention. - Aortic Atherosclerosis (ICD10-I70.0).    EKG: 02/19/22: Sinus rhythm Left ventricular hypertrophy No significant change since last tracing Confirmed by Isla Pence [phone]) on 02/20/2022 11:23:03 AM   CV: US Carotid 02/04/22: Summary:  Right Carotid: Velocities in the right ICA are consistent with a 1-39%  stenosis.  Left Carotid: Velocities in the left ICA are consistent with a 1-39%  stenosis.  Vertebrals: Bilateral vertebral arteries demonstrate antegrade flow.    Echo (limited) with saline contrast bubble study 02/04/22: IMPRESSIONS   1. Left ventricular ejection fraction, by estimation, is 60 to 65%. The  left ventricle has normal function. Left ventricular diastolic parameters  are consistent with Grade I diastolic dysfunction (impaired relaxation).   2. Right ventricular systolic function is normal. The right ventricular  size is normal.   3. No evidence of mitral valve regurgitation.   4. Aortic valve regurgitation is not visualized. Mild aortic valve  stenosis.   5. The inferior vena cava is normal in size with greater than 50%  respiratory variability, suggesting right atrial pressure of 3 mmHg.   6. Agitated saline contrast bubble study was negative, with no evidence  of any interatrial shunt.  - Conclusion(s)/Recommendation(s): No significant interatrial shunt was  identified.    Echo 12/28/21: IMPRESSIONS   1. Left ventricular ejection fraction, by estimation, is 45 to 50%. The  left ventricle has mildly decreased function. The left ventricle  demonstrates global hypokinesis. Left  ventricular diastolic parameters are  consistent with Grade I diastolic  dysfunction (impaired relaxation).   2. Right ventricular systolic function is normal. The right ventricular  size is normal.   3. Left atrial  size was moderately dilated.   4. The mitral valve is normal in structure. No evidence of mitral valve  regurgitation. No evidence of mitral stenosis.   5. The aortic valve is tricuspid. There is moderate calcification of the  aortic valve. There is mild thickening of the aortic valve. Aortic valve  regurgitation is not visualized. Mild to moderate aortic valve stenosis.    Cardiac cath 12/28/21: LAD: Minimal luminal irregularities Ramus INT: Minimal luminal irregularities LCX: Minimal luminal irregularities RCA: Ost RCA lesion is 80% stenosed.  The lesion is moderately calcified.  CONCLUSIONS:   Ost RCA lesion is 80% stenosed.   A drug-eluting stent was successfully placed using a STENT ONYX FRONTIER [id number], postdilated to 4.5 mm in diameter and optimized with intravascular ultrasound.   Post intervention, there is a 0% residual stenosis.   The left ventricular systolic function is normal.   LV end diastolic pressure is normal.   The left ventricular ejection fraction is 50-55% by visual estimate.   There is no aortic valve stenosis. Successful PCI of the ostial RCA which was calcified.  It was a short focal area of calcification.  The distal reference was over 4 mm.  Proximal and ostial stent postdilated aggressively with a 4.5 Diablo balloon.  I stressed the importance of dual antiplatelet therapy and aggressive secondary prevention.    Past Medical History:  Diagnosis Date   Aortic stenosis    mild AS 02/04/22   Arthritis    Diabetes mellitus    fasting 120s   History of kidney stones    Hypertension    Myocardial infarction ([place])    stent placed   Peripheral neuropathy    left fifth finger, left toe   Pneumonia    hx of    Past Surgical History:  Procedure Laterality Date   APPENDECTOMY     CORONARY STENT INTERVENTION N/A 12/28/2021   Procedure: CORONARY STENT INTERVENTION;  Surgeon: Jettie Booze, MD;  Location: [place] [facility];  Service: Cardiovascular;   Laterality: N/A;   CYSTOSCOPY W/ URETERAL STENT PLACEMENT Bilateral 03/17/2014   Procedure: CYSTOSCOPY WITH RIGHT STENT PULL  ;  Surgeon: Alexis Frock, MD;  Location: [facility];  Service: Urology;  Laterality: Bilateral;   CYSTOSCOPY WITH RETROGRADE PYELOGRAM, URETEROSCOPY AND STENT PLACEMENT Bilateral 02/01/2014   Procedure: CYSTOSCOPY WITH BILATERAL RETROGRADE CYSTOSCOPY, BILATERAL RETROGRADE, PYELOGRAM, RIGHT URETEROSCOPY, STONE BASKETRY,  AND BILATERAL STENT PLACEMENT;  Surgeon: Alexis Frock, MD;  Location: [facility];  Service: Urology;  Laterality: Bilateral;   CYSTOSCOPY WITH URETEROSCOPY AND STENT PLACEMENT Left 02/12/2021   Procedure: CYSTOSCOPY WITH LEFT URETEROSCOPY, BALLOON DILITATION, HOLMIUM LASER LITHOTRIPSY AND STENT PLACEMENT;  Surgeon: Lucas Mallow, MD;  Location: [facility];  Service: Urology;  Laterality: Left;   HOLMIUM LASER APPLICATION Left 1/51/7616   Procedure: HOLMIUM LASER APPLICATION;  Surgeon: Alexis Frock, MD;  Location: [facility];  Service: Urology;  Laterality: Left;   INTRAVASCULAR ULTRASOUND/IVUS N/A 12/28/2021   Procedure: Intravascular Ultrasound/IVUS;  Surgeon: Jettie Booze, MD;  Location: [place] [facility];  Service: Cardiovascular;  Laterality: N/A;   LEFT HEART CATH AND CORONARY ANGIOGRAPHY N/A 12/28/2021   Procedure: LEFT HEART CATH AND CORONARY ANGIOGRAPHY;  Surgeon: Jettie Booze, MD;  Location: [place] [facility];  Service: Cardiovascular;  Laterality: N/A;   LITHOTRIPSY     NEPHROLITHOTOMY Left 03/17/2014   Procedure: NEPHROLITHOTOMY PERCUTANEOUS with access;  Surgeon: Alexis Frock, MD;  Location: [facility];  Service: Urology;  Laterality: Left;   NEPHROLITHOTOMY Left 03/19/2014   Procedure: 2ND STAGE NEPHROLITHOTOMY PERCUTANEOUS left ureteroscopy with lasr stone treatment and stent placement, with access;  Surgeon: Alexis Frock, MD;  Location: [facility];  Service: Urology;  Laterality: Left;   TOTAL KNEE ARTHROPLASTY Right 09/02/2014   Procedure:  RIGHT TOTAL KNEE ARTHROPLASTY;  Surgeon: Meredith Pel, MD;  Location: [place];  Service: Orthopedics;  Laterality: Right;   uretral stent      MEDICATIONS: No current facility-administered medications for this encounter.    acetaminophen (TYLENOL) 325 MG tablet   amLODipine (NORVASC) 10 MG tablet   aspirin 81 MG EC tablet   carvedilol (COREG) 12.5 MG tablet   cholecalciferol (VITAMIN D) 1000 units tablet   citalopram (CELEXA) 20 MG tablet   clopidogrel (PLAVIX) 75 MG tablet   Cyanocobalamin (VITAMIN B-12) 2500 MCG SUBL   cyclobenzaprine (FLEXERIL) 10 MG tablet   diclofenac Sodium (VOLTAREN) 1 % GEL   Evolocumab (REPATHA SURECLICK) 297 MG/ML SOAJ   folic acid (FOLVITE) 1 MG tablet   insulin glargine (LANTUS) 100 UNIT/ML Solostar Pen   insulin lispro (HUMALOG) 100 UNIT/ML KwikPen   lisinopril (ZESTRIL) 40 MG tablet   metFORMIN (GLUCOPHAGE) 500 MG tablet   methotrexate (RHEUMATREX) 2.5 MG tablet   Multiple Vitamins-Minerals (MULTIVITAMINS THER. W/MINERALS) TABS   nitroGLYCERIN (NITROSTAT) 0.4 MG SL tablet   Omega-3 Fatty Acids (FISH OIL) 1200 MG CAPS   predniSONE (DELTASONE) 10 MG tablet    Myra Gianotti, PA-C Surgical Short Stay/Anesthesiology [facility] Phone [phone] [facility] Phone [phone] 04/18/2022 3:35 PM

## 2022-04-18 NOTE — Anesthesia Preprocedure Evaluation (Signed)
Anesthesia Evaluation  Patient identified by MRN, date of birth, ID band Patient awake    Reviewed: Allergy & Precautions, NPO status , Patient's Chart, lab work & pertinent test results  Airway Mallampati: II  TM Distance: >3 FB     Dental no notable dental hx.    Pulmonary neg pulmonary ROS,    Pulmonary exam normal        Cardiovascular hypertension, Pt. on medications + CAD, + Past MI and + Cardiac Stents  + Valvular Problems/Murmurs (mild) AS  Rhythm:Regular Rate:Normal     Neuro/Psych CVA negative psych ROS   GI/Hepatic negative GI ROS, Neg liver ROS,   Endo/Other  diabetes, Type 2, Insulin Dependent  Renal/GU      Musculoskeletal  (+) Arthritis , Osteoarthritis,    Abdominal Normal abdominal exam  (+)   Peds  Hematology negative hematology ROS (+)   Anesthesia Other Findings   Reproductive/Obstetrics                           Anesthesia Physical Anesthesia Plan  ASA: 3  Anesthesia Plan: MAC   Post-op Pain Management:    Induction: Intravenous  PONV Risk Score and Plan: 1 and Propofol infusion and Treatment may vary due to age or medical condition  Airway Management Planned: Simple Face Mask, Natural Airway and Nasal Cannula  Additional Equipment: None  Intra-op Plan:   Post-operative Plan:   Informed Consent: I have reviewed the patients History and Physical, chart, labs and discussed the procedure including the risks, benefits and alternatives for the proposed anesthesia with the patient or authorized representative who has indicated his/her understanding and acceptance.       Plan Discussed with: CRNA  Anesthesia Plan Comments: (See PAT note written 04/18/2022 by Myra Gianotti, PA-C.  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 Muscle biopsy could not be done  that admission due to DES RCA on 12/28/21, so would need to wait at least 3 months before DAPT could be temporarily held. Of note, brain MRI showed acute infarct right pons--neurology felt this was more of an incidental finding and not related to his presentation. No severe ICA disease. He was discharged to [facility] for rehab 02/08/22-02/16/22.   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 Has been on steroids, but not an ideal candidate for long-term prednisone due to suboptimal DM control with recent A1c 8.1%. Started methotrexate weekly on 03/07/22. Preauthorization for IVIG done. AChR binding and modulating Ab were negative. Has "Profound neck flexion extension, upper and lower extremity proximal muscle weakness". Last visit 03/23/22 and was awaiting muscle biopsy.   Followed by cardiologist Dr. Whitney Post. Last visit 02/28/22 for follow-up CAD, s/p DES RCA 12/2021. Noted recent history of transminitis and statin myalgia, now off statin and on Repatha. Noted ED visit 02/19/22 for fall and hitting head. Seeing PT for weakness. Noted he is "asymptomatic  with stable angina on therapy". On ASA 81 mg and planning to stop Ticagrelor and transition to Plavix. He noted plans for July muscle biopsy and wrote, "will send copy of our note to patient's neurologist once he knows who that is; planned for July muscle biopsy for weakness (can hold anti-platelets as needed)".)       Anesthesia Quick Evaluation

## 2022-04-18 NOTE — Pre-Procedure Instructions (Addendum)
SDW CALL  Patient was given pre-op instructions over the phone. The opportunity was given for the patient to ask questions. No further questions asked. Patient verbalized understanding of instructions given.   PCP - Greig Right, MD Cardiologist - Werner Lean, MD- per pt MD aware that he is having this procedure   Patient to have steroid infusion on 04/19/22 from 8-12:00 noon.    PPM/ICD - denies Chest x-ray - 02/02/22 EKG - 02/19/22 Stress Test -  ECHO - 02/04/22 Cardiac Cath - 12/28/21  Sleep Study - denies sleep apnea   Fasting Blood Sugar - pt reports blood sugars normally in low 100s. Hgb A1c 8.1 on 03/07/22  Blood Thinner Instructions: last dose of Plavix 04/14/22 Aspirin Instructions:pt states he was told to continue taking ASA  ERAS Protcol - ERAS order  COVID TEST- NA   Anesthesia review: yes  Patient denies any current symptoms/issues over the phone. Pt states he does currently have an IV in and is going for an infusion tomorrow prior to surgery from 8-12:00. Pt states he is unsure if Dr. Brantley Stage is aware of this, and states hew ill notify MD. Anesthesia APP notified.     Surgical Instructions    Your procedure is scheduled on Tuesday, July 25  Report to [facility] Main Entrance "A" at 1:30 P.M., then check in with the Admitting office.  Call this number if you have problems the morning of surgery:  [phone]    Remember:  Do not eat after midnight the night before your surgery  You may drink clear liquids until 1:00PM the day of your surgery.   Clear liquids allowed are: Water, Non-Citrus Juices (without pulp), Carbonated Beverages, Clear Tea, Black Coffee ONLY (NO MILK, CREAM OR POWDERED CREAMER of any kind), and Gatorade   Take these medicines the morning of surgery with A SIP OF WATER: amlodipine (norvasc), carvedilol (coreg), citalopram (Celexa), prednisone, tylenol if needed  Follow your surgeon's instructions on when to stop Aspirin and  Plavix.  If no instructions were given by your surgeon then you will need to call the office to get those instructions.   WHAT DO I DO ABOUT MY DIABETES MEDICATION?   Do not take oral diabetes medicines (pills) the morning of surgery. DO NOT TAKE Metformin (glucophage) the day of surgery  THE NIGHT BEFORE SURGERY, take 7 units of Lantus insulin. (Half dose)       THE MORNING OF SURGERY, take 7 units of Lantus insulin. (Half dose)  DO NOT TAKE Humolog insulin (Insulin lispro)  the night before surgery.    Check your blood sugar the morning of your surgery when you wake up and every 2 hours until you get to the Short Stay unit.  If your blood sugar is less than 70 mg/dL, you will need to treat for low blood sugar: Do not take insulin. Treat a low blood sugar (less than 70 mg/dL) with  cup of clear juice (cranberry or apple), 4 glucose tablets, OR glucose gel. Recheck blood sugar in 15 minutes after treatment (to make sure it is greater than 70 mg/dL). If your blood sugar is not greater than 70 mg/dL on recheck, call [phone] for further instructions. Report your blood sugar to the short stay nurse when you get to Short Stay.   As of today, STOP taking any Aleve, Naproxen, Ibuprofen, Motrin, Advil, Goody's, BC's, all herbal medications, fish oil, and all vitamins. Diclofenac gel  [facility] is not responsible for any belongings or  valuables.    Contacts, glasses, hearing aids, dentures or partials may not be worn into surgery, please bring cases for these belongings   Patients discharged the day of surgery will not be allowed to drive home, and someone needs to stay with them for 24 hours.   SURGICAL WAITING ROOM VISITATION You may have 1 visitor in preop area with you Patients having surgery or a procedure in a hospital may have two support people in the waiting room. Children under the age of 51 must have an adult with them who is not the patient. They may stay in the  waiting area during the procedure and may switch out with other visitors. If the patient needs to stay at the hospital during part of their recovery, the visitor guidelines for inpatient rooms apply.  Please refer to the [facility] website for the visitor guidelines for Inpatients (after your surgery is over and you are in a regular room).     Special instructions:    Oral Hygiene is also important to reduce your risk of infection.  Remember - BRUSH YOUR TEETH THE MORNING OF SURGERY WITH YOUR REGULAR TOOTHPASTE   Day of Surgery:  Take a shower the day of or night before with antibacterial soap. Wear Clean/Comfortable clothing the morning of surgery Do not apply any deodorants/lotions.   Do not wear jewelry or makeup Do not wear lotions, powders, perfumes/colognes, or deodorant. Do not shave 48 hours prior to surgery.  Men may shave face and neck. Do not bring valuables to the hospital. Do not wear nail polish, gel polish, artificial nails, or any other type of covering on natural nails (fingers and toes) If you have artificial nails or gel coating that need to be removed by a nail salon, please have this removed prior to surgery. Artificial nails or gel coating may interfere with anesthesia's ability to adequately monitor your vital signs. Remember to brush your teeth WITH YOUR REGULAR TOOTHPASTE.

## 2022-04-19 ENCOUNTER — Ambulatory Visit (HOSPITAL_BASED_OUTPATIENT_CLINIC_OR_DEPARTMENT_OTHER): Payer: Medicare Other | Admitting: Vascular Surgery

## 2022-04-19 ENCOUNTER — Encounter (HOSPITAL_COMMUNITY): Payer: Self-pay | Admitting: Surgery

## 2022-04-19 ENCOUNTER — Ambulatory Visit (HOSPITAL_COMMUNITY): Payer: Medicare Other | Admitting: Vascular Surgery

## 2022-04-19 ENCOUNTER — Encounter (HOSPITAL_COMMUNITY)
Admission: RE | Disposition: A | Discharge status: Home / Self Care | Payer: Self-pay | Source: Home / Self Care | Attending: Surgery

## 2022-04-19 ENCOUNTER — Ambulatory Visit (HOSPITAL_COMMUNITY)
Admission: RE | Admit: 2022-04-19 | Discharge: 2022-04-19 | Disposition: A | Discharge status: Home / Self Care | Payer: Medicare Other | Attending: Surgery | Admitting: Surgery

## 2022-04-19 ENCOUNTER — Ambulatory Visit: Payer: Medicare Other | Admitting: Occupational Therapy

## 2022-04-19 ENCOUNTER — Ambulatory Visit: Payer: Medicare Other

## 2022-04-19 DIAGNOSIS — I252 Old myocardial infarction: Secondary | ICD-10-CM | POA: Insufficient documentation

## 2022-04-19 DIAGNOSIS — I2510 Atherosclerotic heart disease of native coronary artery without angina pectoris: Secondary | ICD-10-CM | POA: Diagnosis not present

## 2022-04-19 DIAGNOSIS — I10 Essential (primary) hypertension: Secondary | ICD-10-CM | POA: Insufficient documentation

## 2022-04-19 DIAGNOSIS — M6281 Muscle weakness (generalized): Secondary | ICD-10-CM | POA: Insufficient documentation

## 2022-04-19 DIAGNOSIS — M609 Myositis, unspecified: Secondary | ICD-10-CM | POA: Diagnosis not present

## 2022-04-19 DIAGNOSIS — E119 Type 2 diabetes mellitus without complications: Secondary | ICD-10-CM | POA: Diagnosis not present

## 2022-04-19 DIAGNOSIS — I350 Nonrheumatic aortic (valve) stenosis: Secondary | ICD-10-CM | POA: Insufficient documentation

## 2022-04-19 DIAGNOSIS — Z955 Presence of coronary angioplasty implant and graft: Secondary | ICD-10-CM | POA: Insufficient documentation

## 2022-04-19 DIAGNOSIS — Z7984 Long term (current) use of oral hypoglycemic drugs: Secondary | ICD-10-CM | POA: Diagnosis not present

## 2022-04-19 DIAGNOSIS — Z8673 Personal history of transient ischemic attack (TIA), and cerebral infarction without residual deficits: Secondary | ICD-10-CM | POA: Insufficient documentation

## 2022-04-19 HISTORY — PX: MUSCLE BIOPSY: SHX716

## 2022-04-19 HISTORY — DX: Acute myocardial infarction, unspecified: I21.9

## 2022-04-19 HISTORY — DX: Nonrheumatic aortic (valve) stenosis: I35.0

## 2022-04-19 LAB — GLUCOSE, CAPILLARY
Glucose-Capillary: 136 mg/dL — ABNORMAL HIGH (ref 70–99)
Glucose-Capillary: 123 mg/dL — ABNORMAL HIGH (ref 70–99)

## 2022-04-19 SURGERY — MUSCLE BIOPSY
Anesthesia: Monitor Anesthesia Care | Laterality: Left

## 2022-04-19 MED ORDER — CHLORHEXIDINE GLUCONATE 0.12 % MT SOLN
OROMUCOSAL | Status: AC
  Administered 2022-04-19: 15 mL via OROMUCOSAL
  Filled 2022-04-19: qty 15

## 2022-04-19 MED ORDER — LIDOCAINE 2% (20 MG/ML) 5 ML SYRINGE
INTRAMUSCULAR | Status: AC
  Filled 2022-04-19: qty 5

## 2022-04-19 MED ORDER — INSULIN ASPART 100 UNIT/ML IJ SOLN: 0.0000 [IU] | INTRAMUSCULAR | Status: DC | PRN

## 2022-04-19 MED ORDER — FENTANYL CITRATE (PF) 250 MCG/5ML IJ SOLN
INTRAMUSCULAR | Status: AC
  Filled 2022-04-19: qty 5

## 2022-04-19 MED ORDER — CHLORHEXIDINE GLUCONATE 0.12 % MT SOLN
15.0000 mL | OROMUCOSAL | Status: AC
  Filled 2022-04-19: qty 15

## 2022-04-19 MED ORDER — CHLORHEXIDINE GLUCONATE CLOTH 2 % EX PADS: 6.0000 | MEDICATED_PAD | Freq: Once | CUTANEOUS | Status: DC

## 2022-04-19 MED ORDER — BUPIVACAINE-EPINEPHRINE (PF) 0.25% -1:200000 IJ SOLN
INTRAMUSCULAR | Status: AC
  Filled 2022-04-19: qty 30

## 2022-04-19 MED ORDER — PROPOFOL 500 MG/50ML IV EMUL
INTRAVENOUS | Status: DC | PRN
  Administered 2022-04-19: 75 ug/kg/min via INTRAVENOUS

## 2022-04-19 MED ORDER — PROPOFOL 10 MG/ML IV BOLUS
INTRAVENOUS | Status: DC | PRN
  Administered 2022-04-19: 25 mg via INTRAVENOUS
  Administered 2022-04-19: 20 mg via INTRAVENOUS

## 2022-04-19 MED ORDER — CEFAZOLIN SODIUM-DEXTROSE 2-4 GM/100ML-% IV SOLN
INTRAVENOUS | Status: AC
  Filled 2022-04-19: qty 100

## 2022-04-19 MED ORDER — BUPIVACAINE-EPINEPHRINE (PF) 0.25% -1:200000 IJ SOLN
INTRAMUSCULAR | Status: DC | PRN
  Administered 2022-04-19: 20 mL

## 2022-04-19 MED ORDER — FENTANYL CITRATE (PF) 100 MCG/2ML IJ SOLN: 25.0000 ug | INTRAMUSCULAR | Status: DC | PRN

## 2022-04-19 MED ORDER — CEFAZOLIN SODIUM-DEXTROSE 2-4 GM/100ML-% IV SOLN
2.0000 g | INTRAVENOUS | Status: AC
  Administered 2022-04-19: 2 g via INTRAVENOUS

## 2022-04-19 MED ORDER — MIDAZOLAM HCL 2 MG/2ML IJ SOLN
INTRAMUSCULAR | Status: AC
  Filled 2022-04-19: qty 2

## 2022-04-19 MED ORDER — LIDOCAINE 2% (20 MG/ML) 5 ML SYRINGE
INTRAMUSCULAR | Status: DC | PRN
  Administered 2022-04-19: 60 mg via INTRAVENOUS

## 2022-04-19 MED ORDER — 0.9 % SODIUM CHLORIDE (POUR BTL) OPTIME
TOPICAL | Status: DC | PRN
  Administered 2022-04-19: 1000 mL

## 2022-04-19 MED ORDER — OXYCODONE HCL 5 MG PO TABS: 5.0000 mg | ORAL_TABLET | Freq: Four times a day (QID) | ORAL | 0 refills | Status: DC | PRN

## 2022-04-19 MED ORDER — LACTATED RINGERS IV SOLN: INTRAVENOUS | Status: DC

## 2022-04-19 MED ORDER — ACETAMINOPHEN 10 MG/ML IV SOLN: 1000.0000 mg | Freq: Once | INTRAVENOUS | Status: DC | PRN

## 2022-04-19 MED ORDER — FENTANYL CITRATE (PF) 250 MCG/5ML IJ SOLN
INTRAMUSCULAR | Status: DC | PRN
  Administered 2022-04-19 (×2): 50 ug via INTRAVENOUS

## 2022-04-19 MED ORDER — MIDAZOLAM HCL 2 MG/2ML IJ SOLN
INTRAMUSCULAR | Status: DC | PRN
  Administered 2022-04-19: 2 mg via INTRAVENOUS

## 2022-04-19 SURGICAL SUPPLY — 38 items
ADH SKN CLS APL DERMABOND .7 (GAUZE/BANDAGES/DRESSINGS) ×1
BAG COUNTER SPONGE SURGICOUNT (BAG) ×2 IMPLANT
BAG SPNG CNTER NS LX DISP (BAG) ×1
CANISTER SUCT 3000ML PPV (MISCELLANEOUS) IMPLANT
CHLORAPREP W/TINT 10.5 ML (MISCELLANEOUS) ×2 IMPLANT
CNTNR URN SCR LID CUP LEK RST (MISCELLANEOUS) ×1 IMPLANT
CONT SPEC 4OZ STRL OR WHT (MISCELLANEOUS) ×2
COVER SURGICAL LIGHT HANDLE (MISCELLANEOUS) ×2 IMPLANT
DERMABOND ADVANCED (GAUZE/BANDAGES/DRESSINGS) ×1
DERMABOND ADVANCED .7 DNX12 (GAUZE/BANDAGES/DRESSINGS) ×1 IMPLANT
DRAPE LAPAROTOMY 100X72 PEDS (DRAPES) ×2 IMPLANT
DRSG TELFA 3X8 NADH (GAUZE/BANDAGES/DRESSINGS) ×2 IMPLANT
ELECT REM PT RETURN 9FT ADLT (ELECTROSURGICAL) ×2
ELECTRODE REM PT RTRN 9FT ADLT (ELECTROSURGICAL) ×1 IMPLANT
GAUZE 4X4 16PLY ~~LOC~~+RFID DBL (SPONGE) ×2 IMPLANT
GLOVE BIO SURGEON STRL SZ8 (GLOVE) ×2 IMPLANT
GLOVE BIOGEL PI IND STRL 8 (GLOVE) ×1 IMPLANT
GLOVE BIOGEL PI INDICATOR 8 (GLOVE) ×1
GOWN STRL REUS W/ TWL LRG LVL3 (GOWN DISPOSABLE) ×2 IMPLANT
GOWN STRL REUS W/ TWL XL LVL3 (GOWN DISPOSABLE) ×1 IMPLANT
GOWN STRL REUS W/TWL LRG LVL3 (GOWN DISPOSABLE) ×4
GOWN STRL REUS W/TWL XL LVL3 (GOWN DISPOSABLE) ×2
KIT BASIN OR (CUSTOM PROCEDURE TRAY) ×2 IMPLANT
KIT TURNOVER KIT B (KITS) ×2 IMPLANT
NDL HYPO 25GX1X1/2 BEV (NEEDLE) ×1 IMPLANT
NEEDLE HYPO 25GX1X1/2 BEV (NEEDLE) ×2 IMPLANT
NS IRRIG 1000ML POUR BTL (IV SOLUTION) ×2 IMPLANT
PACK GENERAL/GYN (CUSTOM PROCEDURE TRAY) ×2 IMPLANT
PAD ARMBOARD 7.5X6 YLW CONV (MISCELLANEOUS) ×4 IMPLANT
PAD DRESSING TELFA 3X8 NADH (GAUZE/BANDAGES/DRESSINGS) ×1 IMPLANT
PENCIL SMOKE EVACUATOR (MISCELLANEOUS) ×2 IMPLANT
SUT MON AB 4-0 PC3 18 (SUTURE) ×2 IMPLANT
SUT SILK 2 0 (SUTURE) ×2
SUT SILK 2-0 18XBRD TIE 12 (SUTURE) ×1 IMPLANT
SUT VIC AB 3-0 SH 18 (SUTURE) ×2 IMPLANT
SYR CONTROL 10ML LL (SYRINGE) ×2 IMPLANT
TOWEL GREEN STERILE (TOWEL DISPOSABLE) ×2 IMPLANT
TOWEL GREEN STERILE FF (TOWEL DISPOSABLE) ×2 IMPLANT

## 2022-04-19 NOTE — H&P (Signed)
[name] is a 66 y.o. male who is seen today as an office consultation for evaluation of Muscle Biopsy .   Patient presents for evaluation for the left thigh muscle biopsy. He has had a progressive myositis and weakness dating back to 2022. He has been managed on steroids but a definite etiology is unclear. A muscle biopsy was requested in order to facilitate further care for the patient. He states he is lost about 30 pounds. Has been on and off steroids which have helped some. He is undergoing occupational therapy with improvement in function but is still quite weak and cannot get out of a chair without the assistance of a walker. He is able to walk with assistance but also can walk without assistance.  Review of Systems: A complete review of systems was obtained from the patient. I have reviewed this information and discussed as appropriate with the patient. See HPI as well for other ROS.    Medical History: Past Medical History:  Diagnosis Date  Diabetes mellitus without complication (CMS-HCC)  Heart valve disease  History of cancer  History of stroke   There is no problem list on file for this patient.  Past Surgical History:  Procedure Laterality Date  APPENDECTOMY 1982  2ND STAGE NEPHROLITHOTOMY PERCUTANEOUS left ureteroscopy with lasr stone treatment and stent placement, with access, HOLMIUM LASER APPLICATION 73/41/9379  Dr. Tresa Moore  RIGHT TOTAL KNEE ARTHROPLASTY 09/02/2014  Dr. Bernerd Limbo    No Known Allergies  Current Outpatient Medications on File Prior to Visit  Medication Sig Dispense Refill  amLODIPine (NORVASC) 10 MG tablet Take 10 mg by mouth every morning  aspirin 81 MG EC tablet Take by mouth  carvediloL (COREG) 12.5 MG tablet Take by mouth  cholecalciferol (VITAMIN D3) 1000 unit tablet Take by mouth  citalopram (CELEXA) 20 MG tablet Take 20 mg by mouth once daily  clopidogreL (PLAVIX) 75 mg tablet 1st dose take 300 mg then 75 mg daily  cyanocobalamin, vitamin  B-12, (VITAMIN B-12) 2,500 mcg Subl Place under the tongue  insulin GLARGINE (LANTUS SOLOSTAR) pen injector (concentration 100 units/mL) Inject subcutaneously  lisinopriL (ZESTRIL) 40 MG tablet Take 40 mg by mouth every morning  metFORMIN (GLUCOPHAGE) 500 MG tablet Take by mouth  methotrexate (RHEUMATREX) 2.5 MG tablet Take by mouth  predniSONE (DELTASONE) 10 MG tablet 4 tabs every morning for one week 3 tabs every morning for one week. Then keep 2 tabs every morning.  REPATHA SURECLICK 024 mg/mL PnIj Inject subcutaneously   No current facility-administered medications on file prior to visit.   History reviewed. No pertinent family history.   Social History   Tobacco Use  Smoking Status Never  Smokeless Tobacco Never    Social History   Socioeconomic History  Marital status: Widowed  Tobacco Use  Smoking status: Never  Smokeless tobacco: Never  Substance and Sexual Activity  Alcohol use: Yes  Alcohol/week: 2.0 standard drinks  Types: 2 Cans of beer per week  Drug use: Never   Objective:   Vitals:  04/01/22 0911  BP: 138/70  Pulse: 95  Temp: 36.4 C (97.5 F)  SpO2: 98%  Weight: 79.8 kg (176 lb)  Height: 177.8 cm ('5\' 10"'$ )   Body mass index is 25.25 kg/m.  Physical Exam Constitutional:  Appearance: Normal appearance.  Eyes:  Pupils: Pupils are equal, round, and reactive to light.  Cardiovascular:  Rate and Rhythm: Normal rate.  Pulmonary:  Effort: Pulmonary effort is normal.  Breath sounds: No stridor.  Abdominal:  General: Abdomen is flat.  Palpations: Abdomen is soft.  Musculoskeletal:  Comments: Significant muscle wasting noted especially to his lower extremities and core. Muscle tone appears normal. Requires mild assistance with getting up from the table.  Skin: General: Skin is warm and dry.  Neurological:  Mental Status: He is alert.  Motor: Weakness present.  Gait: Gait abnormal.  Psychiatric:  Mood and Affect: Mood normal.    Assessment  and Plan:   Diagnoses and all orders for this visit:  Muscle weakness    Plan for left thigh muscle biopsy. Risks and benefits of surgery reviewed with the patient and daughter. Risk of bleeding, infection, exacerbation of underlying medical problems, hematoma and wound complications discussed.  No follow-ups on file.  Kennieth Francois, MD

## 2022-04-19 NOTE — Progress Notes (Signed)
Anesthesia stated no need for labs today

## 2022-04-19 NOTE — Op Note (Signed)
Preoperative diagnosis: Progressive muscle weakness  Postoperative diagnosis: Same  Procedure: Left thigh muscle biopsy of the anterior quadricep    Surgeon: Erroll Luna MD   Assistant: Clerance Lav MD    I was personally present / performed the key and critical portions of this procedure and immediately available throughout the entire procedure, as documented in my operative note.  Anesthesia: MAC with 0.25 % local  EBL: minimal    Indications for procedure: Pt presents for muscle biopsy due to progressive weakness.The procedure has been discussed with the patient.  Alternative therapies have been discussed with the patient.  Operative risks include bleeding,  Infection,  Organ injury,  Nerve injury,  Blood vessel injury,  DVT,  Pulmonary embolism,  Death,  And possible reoperation.  Medical management risks include worsening of present situation.  The success of the procedure is 50 -90 % at treating patients symptoms.  The patient understands and agrees to proceed.       Description of procedure: Pt met in the holding area and questions answered. He was placed supine on the OR table.  Left thigh prepped and draped in a sterile fashion. Time out performed. 3 cm incision made in the left anterior thigh after infiltration of local anesthesia.  Dissection carried down to the fascia and this was opened. 1 cm length of muscle removed.  Good hemostasis achieved. Fascia closed  3 0 vicryl .  4 0 MONOCRYL used to close the skin. Dermabond applied and all counts correct.  Pt taken to recovery in satisfactory condition.

## 2022-04-19 NOTE — Discharge Instructions (Signed)
#######################################################  GENERAL SURGERY: POST OP INSTRUCTIONS  ######################################################################  EAT Gradually transition to a high fiber diet with a fiber supplement over the next few weeks after discharge.  Start with a pureed / full liquid diet (see below)  WALK Walk an hour a day.  Control your pain to do that.    CONTROL PAIN Control pain so that you can walk, sleep, tolerate sneezing/coughing, go up/down stairs.  HAVE A BOWEL MOVEMENT DAILY Keep your bowels regular to avoid problems.  OK to try a laxative to override constipation.  OK to use an antidairrheal to slow down diarrhea.  Call if not better after 2 tries  CALL IF YOU HAVE PROBLEMS/CONCERNS Call if you are still struggling despite following these instructions. Call if you have concerns not answered by these instructions  ######################################################################    DIET: Follow a light bland diet & liquids the first 24 hours after arrival home, such as soup, liquids, starches, etc.  Be sure to drink plenty of fluids.  Quickly advance to a usual solid diet within a few days.  Avoid fast food or heavy meals as your are more likely to get nauseated or have irregular bowels.  A low-fat, high-fiber diet for the rest of your life is ideal.    Take your usually prescribed home medications unless otherwise directed.  PAIN CONTROL: Pain is best controlled by a usual combination of three different methods TOGETHER: Ice/Heat Over the counter pain medication Prescription pain medication Most patients will experience some swelling and bruising around the incisions.  Ice packs or heating pads (30-60 minutes up to 6 times a day) will help. Use ice for the first few days to help decrease swelling and bruising, then switch to heat to help relax tight/sore spots and speed recovery.  Some people prefer to use ice alone, heat alone,  alternating between ice & heat.  Experiment to what works for you.  Swelling and bruising can take several weeks to resolve.   It is helpful to take an over-the-counter pain medication regularly for the first few weeks.  Choose one of the following that works best for you: Naproxen (Aleve, etc)  Two 220mg tabs twice a day Ibuprofen (Advil, etc) Three 200mg tabs four times a day (every meal & bedtime) Acetaminophen (Tylenol, etc) 500-650mg four times a day (every meal & bedtime) A  prescription for pain medication (such as oxycodone, hydrocodone, etc) should be given to you upon discharge.  Take your pain medication as prescribed.  If you are having problems/concerns with the prescription medicine (does not control pain, nausea, vomiting, rash, itching, etc), please call us [phone] to see if we need to switch you to a different pain medicine that will work better for you and/or control your side effect better. If you need a refill on your pain medication, please contact your pharmacy.  They will contact our office to request authorization. Prescriptions will not be filled after 5 pm or on week-ends.  Avoid getting constipated.  Between the surgery and the pain medications, it is common to experience some constipation.  Increasing fluid intake and taking a fiber supplement (such as Metamucil, Citrucel, FiberCon, MiraLax, etc) 1-2 times a day regularly will usually help prevent this problem from occurring.  A mild laxative (prune juice, Milk of Magnesia, MiraLax, etc) should be taken according to package directions if there are no bowel movements after 48 hours.   Watch out for diarrhea.  If you have many loose bowel movements, simplify your   diet to bland foods & liquids for a few days.  Stop any stool softeners and decrease your fiber supplement.  Switching to mild anti-diarrheal medications (Loperamide/Imodium, Kayopectate, Pepto Bismol) can help.  If this worsens or does not improve, please call  us.  Wash / shower every day.  You may shower over the dressings as they are waterproof.  Continue to shower over incision(s) after the dressing is off. Remove your waterproof bandages 5 days after surgery.  You may leave the incision open to air.  You may have skin tapes (Steri Strips) covering the incision(s).  Leave them on until one week, then remove.  You may replace a dressing/Band-Aid to cover the incision for comfort if you wish.   ACTIVITIES as tolerated:   You may resume regular (light) daily activities beginning the next day--such as daily self-care, walking, climbing stairs--gradually increasing activities as tolerated.  If you can walk 30 minutes without difficulty, it is safe to try more intense activity such as jogging, treadmill, bicycling, low-impact aerobics, swimming, etc. Save the most intensive and strenuous activity for last such as sit-ups, heavy lifting, contact sports, etc  Refrain from any heavy lifting or straining until you are off narcotics for pain control.   DO NOT PUSH THROUGH PAIN.  Let pain be your guide: If it hurts to do something, don't do it.  Pain is your body warning you to avoid that activity for another week until the pain goes down. You may drive when you are no longer taking prescription pain medication, you can comfortably wear a seatbelt, and you can safely maneuver your car and apply brakes. You may have sexual intercourse when it is comfortable.   FOLLOW UP in our office Please call [facility] at [phone] to set up an appointment to see your surgeon in the office for a follow-up appointment approximately 2-3 weeks after your surgery. Make sure that you call for this appointment the day you arrive home to insure a convenient appointment time.  9. IF YOU HAVE DISABILITY OR FAMILY LEAVE FORMS, BRING THEM TO THE OFFICE FOR PROCESSING.  DO NOT GIVE THEM TO YOUR DOCTOR.   WHEN TO CALL US [phone]: Poor pain control Reactions / problems with new  medications (rash/itching, nausea, etc)  Fever over 101.5 F (38.5 C) Worsening swelling or bruising Continued bleeding from incision. Increased pain, redness, or drainage from the incision Difficulty breathing / swallowing   The clinic staff is available to answer your questions during regular business hours (8:30am-5pm).  Please don't hesitate to call and ask to speak to one of our nurses for clinical concerns.   If you have a medical emergency, go to the nearest emergency room or call 911.  A surgeon from [facility] is always on call at the hospitals   [facility] [address], [address], [place], [place]  [address] ? MAIN: [phone] ? TOLL FREE: [phone] ?  FAX [phone] [email or web address]  #######################################################  

## 2022-04-19 NOTE — Transfer of Care (Signed)
Immediate Anesthesia Transfer of Care Note  Patient: [name]  Procedure(s) Performed: LEFT THIGH MUSCLE BIOPSY (Left)  Patient Location: PACU  Anesthesia Type:MAC  Level of Consciousness: drowsy and patient cooperative  Airway & Oxygen Therapy: Patient Spontanous Breathing and Patient connected to nasal cannula oxygen  Post-op Assessment: Report given to RN and Post -op Vital signs reviewed and stable  Post vital signs: Reviewed and stable  Last Vitals:  Vitals Value Taken Time  BP 131/81 04/19/22 1608  Temp    Pulse 73 04/19/22 1610  Resp 18 04/19/22 1610  SpO2 97 % 04/19/22 1610  Vitals shown include unvalidated device data.  Last Pain:  Vitals:   04/19/22 1357  PainSc: 0-No pain      Patients Stated Pain Goal: 0 (32/99/24 2683)  Complications: No notable events documented.

## 2022-04-19 NOTE — Anesthesia Procedure Notes (Signed)
Procedure Name: MAC Date/Time: 04/19/2022 3:29 PM  Performed by: Colin Benton, CRNAPre-anesthesia Checklist: Patient identified, Emergency Drugs available, Suction available and Patient being monitored Patient Re-evaluated:Patient Re-evaluated prior to induction Oxygen Delivery Method: Nasal cannula Induction Type: IV induction Placement Confirmation: positive ETCO2 Dental Injury: Teeth and Oropharynx as per pre-operative assessment

## 2022-04-19 NOTE — Interval H&P Note (Signed)
History and Physical Interval Note:  04/19/2022 2:51 PM  [name]  has presented today for surgery, with the diagnosis of [place].  The various methods of treatment have been discussed with the patient and family. After consideration of risks, benefits and other options for treatment, the patient has consented to  Procedure(s): LEFT THIGH MUSCLE BIOPSY (Left) as a surgical intervention.  The patient's history has been reviewed, patient examined, no change in status, stable for surgery.  I have reviewed the patient's chart and labs.  Questions were answered to the patient's satisfaction.     [place]

## 2022-04-20 ENCOUNTER — Encounter (HOSPITAL_COMMUNITY): Payer: Self-pay | Admitting: Surgery

## 2022-04-20 NOTE — Anesthesia Postprocedure Evaluation (Signed)
Anesthesia Post Note  Patient: [name]  Procedure(s) Performed: LEFT THIGH MUSCLE BIOPSY (Left)     Patient location during evaluation: PACU Anesthesia Type: MAC Level of consciousness: awake and alert Pain management: pain level controlled Vital Signs Assessment: post-procedure vital signs reviewed and stable Respiratory status: spontaneous breathing, nonlabored ventilation and respiratory function stable Cardiovascular status: blood pressure returned to baseline Postop Assessment: no apparent nausea or vomiting Anesthetic complications: no   No notable events documented.  Last Vitals:  Vitals:   04/19/22 1615 04/19/22 1630  BP: 135/80 (!) 153/78  Pulse: 77 81  Resp: 15 14  Temp:    SpO2: 98% 95%    Last Pain:  Vitals:   04/19/22 1630  PainSc: 0-No pain   Pain Goal: Patients Stated Pain Goal: 0 (04/19/22 1357)                 Marthenia Rolling

## 2022-04-21 ENCOUNTER — Ambulatory Visit: Payer: Medicare Other | Admitting: Internal Medicine

## 2022-04-21 ENCOUNTER — Ambulatory Visit: Payer: Medicare Other

## 2022-04-21 ENCOUNTER — Other Ambulatory Visit: Payer: Self-pay

## 2022-04-21 ENCOUNTER — Ambulatory Visit: Payer: Medicare Other | Admitting: Occupational Therapy

## 2022-04-25 ENCOUNTER — Ambulatory Visit: Payer: Medicare Other | Admitting: Occupational Therapy

## 2022-04-25 ENCOUNTER — Encounter: Payer: Self-pay | Admitting: Occupational Therapy

## 2022-04-25 DIAGNOSIS — R2689 Other abnormalities of gait and mobility: Secondary | ICD-10-CM | POA: Diagnosis not present

## 2022-04-25 DIAGNOSIS — M3320 Polymyositis, organ involvement unspecified: Secondary | ICD-10-CM | POA: Diagnosis not present

## 2022-04-25 DIAGNOSIS — R278 Other lack of coordination: Secondary | ICD-10-CM

## 2022-04-25 DIAGNOSIS — R262 Difficulty in walking, not elsewhere classified: Secondary | ICD-10-CM | POA: Diagnosis not present

## 2022-04-25 DIAGNOSIS — M25512 Pain in left shoulder: Secondary | ICD-10-CM | POA: Diagnosis not present

## 2022-04-25 DIAGNOSIS — R293 Abnormal posture: Secondary | ICD-10-CM

## 2022-04-25 DIAGNOSIS — R2681 Unsteadiness on feet: Secondary | ICD-10-CM | POA: Diagnosis not present

## 2022-04-25 DIAGNOSIS — R296 Repeated falls: Secondary | ICD-10-CM | POA: Diagnosis not present

## 2022-04-25 DIAGNOSIS — M6281 Muscle weakness (generalized): Secondary | ICD-10-CM

## 2022-04-25 DIAGNOSIS — M25511 Pain in right shoulder: Secondary | ICD-10-CM

## 2022-04-25 NOTE — Therapy (Signed)
OUTPATIENT OCCUPATIONAL THERAPY TREATMENT NOTE   Patient Name: [name] MRN: [id number] DOB:[birth year], 66 y.o., male Today's Date: 04/25/2022  PCP: Greig Right, MD REFERRING PROVIDER: Cathlyn Parsons, PA-C   END OF SESSION:   OT End of Session - 04/25/22 1814     Visit Number 14    Number of Visits 21    Date for OT Re-Evaluation 05/12/22    Authorization Type BCBS Medicare    Authorization Time Period VL: MN    OT Start Time 1545    OT Stop Time 1627    OT Time Calculation (min) 42 min    Activity Tolerance Patient tolerated treatment well    Behavior During Therapy WFL for tasks assessed/performed            Past Medical History:  Diagnosis Date   Aortic stenosis    mild AS 02/04/22   Arthritis    Diabetes mellitus    fasting 120s   History of kidney stones    Hypertension    Myocardial infarction ([place])    stent placed   Peripheral neuropathy    left fifth finger, left toe   Pneumonia    hx of   Past Surgical History:  Procedure Laterality Date   APPENDECTOMY     CORONARY STENT INTERVENTION N/A 12/28/2021   Procedure: CORONARY STENT INTERVENTION;  Surgeon: Jettie Booze, MD;  Location: [facility];  Service: Cardiovascular;  Laterality: N/A;   CYSTOSCOPY W/ URETERAL STENT PLACEMENT Bilateral 03/17/2014   Procedure: CYSTOSCOPY WITH RIGHT STENT PULL  ;  Surgeon: Alexis Frock, MD;  Location[facility];  Service: Urology;  Laterality: Bilateral;   CYSTOSCOPY WITH RETROGRADE PYELOGRAM, URETEROSCOPY AND STENT PLACEMENT Bilateral 02/01/2014   Procedure: CYSTOSCOPY WITH BILATERAL RETROGRADE CYSTOSCOPY, BILATERAL RETROGRADE, PYELOGRAM, RIGHT URETEROSCOPY, STONE BASKETRY,  AND BILATERAL STENT PLACEMENT;  Surgeon: Alexis Frock, MD;  [facility];  Service: Urology;  Laterality: Bilateral;   CYSTOSCOPY WITH URETEROSCOPY AND STENT PLACEMENT Left 02/12/2021   Procedure: CYSTOSCOPY WITH LEFT URETEROSCOPY, BALLOON DILITATION, HOLMIUM LASER  LITHOTRIPSY AND STENT PLACEMENT;  Surgeon: Lucas Mallow, MD;  [facility];  Service: Urology;  Laterality: Left;   HOLMIUM LASER APPLICATION Left 5/36/6440   Procedure: HOLMIUM LASER APPLICATION;  Surgeon: Alexis Frock, MD;  [facility];  Service: Urology;  Laterality: Left;   INTRAVASCULAR ULTRASOUND/IVUS N/A 12/28/2021   Procedure: Intravascular Ultrasound/IVUS;  Surgeon: Jettie Booze, MD;  Location: [facility];  Service: Cardiovascular;  Laterality: N/A;   LEFT HEART CATH AND CORONARY ANGIOGRAPHY N/A 12/28/2021   Procedure: LEFT HEART CATH AND CORONARY ANGIOGRAPHY;  Surgeon: Jettie Booze, MD;  Location: [facility];  Service: Cardiovascular;  Laterality: N/A;   LITHOTRIPSY     MUSCLE BIOPSY Left 04/19/2022   Procedure: LEFT THIGH MUSCLE BIOPSY;  Surgeon: Erroll Luna, MD;  Location: [facility];  Service: General;  Laterality: Left;   NEPHROLITHOTOMY Left 03/17/2014   Procedure: NEPHROLITHOTOMY PERCUTANEOUS with access;  Surgeon: Alexis Frock, MD;  [facility];  Service: Urology;  Laterality: Left;   NEPHROLITHOTOMY Left 03/19/2014   Procedure: 2ND STAGE NEPHROLITHOTOMY PERCUTANEOUS left ureteroscopy with lasr stone treatment and stent placement, with access;  Surgeon: Alexis Frock, MD;  [facility];  Service: Urology;  Laterality: Left;   TOTAL KNEE ARTHROPLASTY Right 09/02/2014   Procedure: RIGHT TOTAL KNEE ARTHROPLASTY;  Surgeon: Meredith Pel, MD;  Location: [facility];  Service: Orthopedics;  Laterality: Right;   uretral  stent     Patient Active Problem List   Diagnosis Date Noted   Diabetic peripheral neuropathy associated with type 2 diabetes mellitus ([place]) 03/23/2022   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   Gait abnormality 03/07/2022   Cerebrovascular accident (CVA) ([place]) 03/07/2022   Myalgia due to statin 02/28/2022   Mild aortic stenosis 02/28/2022   Polymyositis ([place]) 02/04/2022   Elevated CK 02/02/2022   Elevated LFTs  02/02/2022   CAD (coronary artery disease) 02/02/2022   Muscle weakness of all 4 extremities 02/02/2022   Hyperlipidemia 01/28/2022   Arthritis of knee 09/02/2014   Staghorn kidney stones 03/17/2014   Nephrolithiasis 02/01/2014   Pneumonia 08/27/2011   Diabetes mellitus with coincident hypertension ([place]) 08/27/2011   ONSET DATE: 12/27/21  REFERRING DIAG: M33.20 (ICD-10-CM) - Polymyositis, organ involvement unspecified   THERAPY DIAG:  Muscle weakness (generalized)  Other lack of coordination  Unsteadiness on feet  Abnormal posture  Right shoulder pain, unspecified chronicity  Left shoulder pain, unspecified chronicity  Rationale for Evaluation and Treatment Rehabilitation  PERTINENT HISTORY: Suspected polymyositis; non-ST-elevation myocardial infarction (NSTEMI) s/p percutaneous coronary intervention (PCI) and drug-eluting stent (DES) placement 12/28/21; incidental small silent brainstem stroke (R pons infarct) identified per MRI; cervical spine neuroforaminal stenosis  R shoulder partial to near full-thickness supraspinatus tear; bilateral shoulder pain w/ R > L  Additional PMH: CAD, DMT2, HTN, dyslipidemia, nephrolithiasis, recent transaminitis, h/o stage I prostate cancer, and R TKA in 2016  PRECAUTIONS: Fall; gradual return to driving; don't lift over 10 lbs  SUBJECTIVE:  SUBJECTIVE STATEMENT: Patient reports that he felt soreness all over after last pool session.  Patient is excited to be travelling in the near future, and he is motivated to be able to get into and out of bed on his own without use of handrail.    PAIN: Are you having pain? 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  Patient walked barefoot on pool deck without device and with supervision.  Patient entered and exited the pool via the stairs and 2 hand rails.  Patient reports having fear of water now whereas he was very comfortable in the water prior to illness.  Patient reports difficulty staying afloat, coordinating limbs, and having the stamina to swim.  Reviewed that intent of aquatic program was not to teach him to swim, but to address impairments of coordination, strength, balance, and overall conditioning - and that through exercise and functional movements he may build up to swimming comfortably in the future.   Worked with extended dumbbell for full body prone float to increase range in BUE.  Worked on prone to stand to address posterior core muscles.  Worked with light dumbbells to address shoulder flex/ext, abd/add, elbow flex/ext.   Gross motor coordination addressed via Ai Chi exercises, as well as walking with dumbbells.   Worked on swimming on back with a goal of 5 min continuous  gentle movement.  Patient became SOB after 4 min and given a 2 min rest break  Patient did attempt swimming from prone position, working to coordinate breathing with stroke.     PATIENT EDUCATION: Education provided regarding importance of decreasing compensatory shoulder/trunk movements during shoulder ROM and strength exercises Person educated: Patient  Education method: Explanation Education comprehension: verbalized understanding   UPPER EXTREMITY ROM     Active ROM Right Eval - 5/25  6/27  7/6  Shoulder flexion 51 48 52  Shoulder abduction 50 68 63  Shoulder extension 40    Shoulder IR 80    Shoulder ER 40    Elbow flexion 135    Elbow extension 41 3   (Blank rows = not tested)  Active ROM Left Eval - 5/25  6/27  7/6  Shoulder flexion 55 79 96  Shoulder abduction 58 87   Shoulder extension 47    Shoulder IR 84    Shoulder ER 35     Elbow flexion 138    Elbow extension 6 4   (Blank rows = not tested)   HOME EXERCISE PROGRAM: MedBridge Code: [id number] - Supine Shoulder Flexion AAROM with Hands Clasped  - 2 x daily - 1 sets - 10 reps - 5 sec hold - Shoulder Flexion Wall Slide with Towel  - 2 x daily - 7 x weekly - 2 sets - 5 reps - Seated Shoulder Flexion with Dowel to 90  - 2 x daily - 2-3 sets - 10-15 reps - Seated Shoulder Row with Anchored Resistance  - 2 x daily - 2-3 sets - 10-15 reps - Seated Shoulder External Rotation with Dumbbells  - 2 x daily - 2-3 sets - 10-15 reps - Seated Upper Trapezius Stretch  - 2 x daily - 7 x weekly - 1 sets - 25 reps - Gentle Levator Scapulae Stretch  - 2 x daily - 7 x weekly - 1 sets - 25 reps  GOALS: Goals reviewed with patient? Yes  SHORT TERM GOALS: Target date: 03/31/22  STG  Status:  1 Pt will verbalize understanding of at least 3 fall prevention and/or energy conservation strategies to increase safety w/ functional mobility at home Baseline: Decreased knowledge of strategies Met - 02/24/22  2 Pt will demonstrate independence w/ recommended DME and/or AE (e.g., long-handle sponge, dressing stick, etc.) to improve participation and safety w/ ADLs Baseline: Decreased knowledge of AE Met - 03/31/22  3 Pt will improve functional use of BUEs as evidence by being able to achieve at least 70 degrees of shoulder flexion w/out pain Baseline: RUE: 51 deg, LUE: 55 deg Met - 03/31/22:  RUE: 79 deg,  LUE: 96 deg  4 Pt will improve functional bilateral reach by increasing R elbow extension to at least 25 degrees Baseline: 41* of flexion when attempting extension Met - 03/22/22; WFL    LONG TERM GOALS: Target date: 05/12/22  LTG  Status:  1 Pt will demonstrate independence w/ full HEP designed for ROM and strength of BUEs Baseline: No HEP at this time Progressing  2 Pt will improve QuickDASH score by at least 19 points to indicate mild functional impairment Baseline: 59.1 (moderate,  borderline severe) impairment) Met - 04/07/22: 22.7  3 Pt will improve functional use of BUEs during grooming tasks (e.g., shampooing hair) as evidenced by improving strength of BUE shoulder flexion to at least 3+/5  Baseline: To be assessed Progressing  4 With both hands, pt will be able to grip w/ at least 20 lbs of force to improve functional use of BUEs during IADLs Baseline: Unable to assess at eval due to medical restrictions Met - 04/07/22: RUE 56 lbs, LUE 73 lbs  5 Pt will be able to retrieve lightweight object (< 5 lbs) from overhead height w/out pain by d/c Baseline:  Unable to reach overhead Met - 04/07/22  6 Pt will be able to retrieve medium-weight object (~5 lbs) from both low and overhead heights by d/c Baseline: Difficulty retrieving cast iron skillet from oven Progressing    ASSESSMENT:  CLINICAL IMPRESSION: Patient seen for aquatic therapy visit today.  Patient reports no restrictions from water exercise after recent muscle biopsy.  Incision approximated (surgical glue)  Patient reports overall feeling of slowly improving activity tolerance, and return to "more normal" activities - going out with friends, going to concerts, playing pool, going to movies, climbing stairs, etc.  Patient indicates he is a long way from feeling normal but pleased with improvement.    PERFORMANCE DEFICITS in functional skills including ADLs, IADLs, ROM, strength, pain, GMC, mobility, balance, body mechanics, endurance, cardiopulmonary status limiting function, decreased knowledge of precautions, decreased knowledge of use of DME, and UE functional use.   IMPAIRMENTS are limiting patient from ADLs, IADLs, rest and sleep, work, leisure, and social participation.   COMORBIDITIES has co-morbidities such as NSTEMI, CAD, and DM  that affects occupational performance. Patient will benefit from skilled OT to address above impairments and improve overall function.   PLAN: OT FREQUENCY: 2x/week  OT  DURATION: 10 weeks  PLANNED INTERVENTIONS: self care/ADL training, therapeutic exercise, therapeutic activity, manual therapy, passive range of motion, functional mobility training, aquatic therapy, moist heat, cryotherapy, patient/family education, energy conservation, DME and/or AE instructions, and Re-evaluation  RECOMMENDED OTHER SERVICES: Currently receiving PT services at this location  CONSULTED AND AGREED WITH PLAN OF CARE: Patient  PLAN FOR NEXT SESSION: Reassess ROM/MMT, progress toward goals; Continue to focus on core and BUE strength (R more impacted than L)   Antony Salmon,  OTR/L 04/25/2022, 6:16 PM

## 2022-04-27 NOTE — Therapy (Signed)
OUTPATIENT PHYSICAL THERAPY LOWER EXTREMITY TREATMENT   Patient Name: [name] MRN: [id number] DOB:03/23/1956, 66 y.o., male Today's Date: 04/28/2022   PT End of Session - 04/28/22 1100     Visit Number 18    PT Start Time 1100    PT Stop Time 1771    PT Time Calculation (min) 42 min    Activity Tolerance Patient limited by fatigue;Patient tolerated treatment well    Behavior During Therapy [facility] for tasks assessed/performed                  Past Medical History:  Diagnosis Date   Aortic stenosis    mild AS 02/04/22   Arthritis    Diabetes mellitus    fasting 120s   History of kidney stones    Hypertension    Myocardial infarction ([place])    stent placed   Peripheral neuropathy    left fifth finger, left toe   Pneumonia    hx of   Past Surgical History:  Procedure Laterality Date   APPENDECTOMY     CORONARY STENT INTERVENTION N/A 12/28/2021   Procedure: CORONARY STENT INTERVENTION;  Surgeon: Jettie Booze, MD;  Location: [facility];  Service: Cardiovascular;  Laterality: N/A;   CYSTOSCOPY W/ URETERAL STENT PLACEMENT Bilateral 03/17/2014   Procedure: CYSTOSCOPY WITH RIGHT STENT PULL  ;  Surgeon: Alexis Frock, MD;  [facility];  Service: Urology;  Laterality: Bilateral;   CYSTOSCOPY WITH RETROGRADE PYELOGRAM, URETEROSCOPY AND STENT PLACEMENT Bilateral 02/01/2014   Procedure: CYSTOSCOPY WITH BILATERAL RETROGRADE CYSTOSCOPY, BILATERAL RETROGRADE, PYELOGRAM, RIGHT URETEROSCOPY, STONE BASKETRY,  AND BILATERAL STENT PLACEMENT;  Surgeon: Alexis Frock, MD;  [facility];  Service: Urology;  Laterality: Bilateral;   CYSTOSCOPY WITH URETEROSCOPY AND STENT PLACEMENT Left 02/12/2021   Procedure: CYSTOSCOPY WITH LEFT URETEROSCOPY, BALLOON DILITATION, HOLMIUM LASER LITHOTRIPSY AND STENT PLACEMENT;  Surgeon: Lucas Mallow, MD;  [facility];  Service: Urology;  Laterality: Left;   HOLMIUM LASER APPLICATION Left 1/65/7903   Procedure: HOLMIUM LASER  APPLICATION;  Surgeon: Alexis Frock, MD;  [facility];  Service: Urology;  Laterality: Left;   INTRAVASCULAR ULTRASOUND/IVUS N/A 12/28/2021   Procedure: Intravascular Ultrasound/IVUS;  Surgeon: Jettie Booze, MD;  Location: [facility];  Service: Cardiovascular;  Laterality: N/A;   LEFT HEART CATH AND CORONARY ANGIOGRAPHY N/A 12/28/2021   Procedure: LEFT HEART CATH AND CORONARY ANGIOGRAPHY;  Surgeon: Jettie Booze, MD;  Location: [facility];  Service: Cardiovascular;  Laterality: N/A;   LITHOTRIPSY     MUSCLE BIOPSY Left 04/19/2022   Procedure: LEFT THIGH MUSCLE BIOPSY;  Surgeon: Erroll Luna, MD;  Location: [facility];  Service: General;  Laterality: Left;   NEPHROLITHOTOMY Left 03/17/2014   Procedure: NEPHROLITHOTOMY PERCUTANEOUS with access;  Surgeon: Alexis Frock, MD;  [facility];  Service: Urology;  Laterality: Left;   NEPHROLITHOTOMY Left 03/19/2014   Procedure: 2ND STAGE NEPHROLITHOTOMY PERCUTANEOUS left ureteroscopy with lasr stone treatment and stent placement, with access;  Surgeon: Alexis Frock, MD;  [facility];  Service: Urology;  Laterality: Left;   TOTAL KNEE ARTHROPLASTY Right 09/02/2014   Procedure: RIGHT TOTAL KNEE ARTHROPLASTY;  Surgeon: Meredith Pel, MD;  Location: [facility];  Service: Orthopedics;  Laterality: Right;   uretral stent     Patient Active Problem List   Diagnosis Date Noted   Diabetic peripheral neuropathy associated with type 2 diabetes mellitus ([place]) 03/23/2022   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   Gait abnormality  03/07/2022   Cerebrovascular accident (CVA) ([place]) 03/07/2022   Myalgia due to statin 02/28/2022   Mild aortic stenosis 02/28/2022   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   Elevated CK 02/02/2022   Elevated LFTs 02/02/2022   CAD (coronary artery disease) 02/02/2022   Muscle weakness of all 4 extremities 02/02/2022   Hyperlipidemia 01/28/2022   Arthritis of knee 09/02/2014   Staghorn kidney stones 03/17/2014    Nephrolithiasis 02/01/2014   Pneumonia 08/27/2011   Diabetes mellitus with coincident hypertension ([place]) 08/27/2011    PCP: Greig Right, MD  REFERRING PROVIDER: Cathlyn Parsons, PA-C   REFERRING DIAG: M33.20 (ICD-10-CM) - Polymyositis, organ involvement unspecified   THERAPY DIAG:  Muscle weakness (generalized)  Other lack of coordination  Unsteadiness on feet  Other abnormalities of gait and mobility  Abnormal posture  Difficulty in walking, not elsewhere classified  Polymyositis ([place])  Rationale for Evaluation and Treatment Rehabilitation  ONSET DATE: 02/15/2022   SUBJECTIVE:   SUBJECTIVE STATEMENT:  Started infusions and will go every two weeks, feeling stronger. Doing aquatic therapy once a week and that makes me feel overall better and stronger.   PERTINENT HISTORY: [name] is a 66 y.o. right-handed male with history of type 2 diabetes mellitus hypertension intolerance of statin due to abnormal LFTs (off for the past years), CAD status post DES to RCA 12/28/2021 (addition of arterial for statin 20 mg, aspirin/Brilinta),   PAIN:  Are you having pain? No  PRECAUTIONS: None  WEIGHT BEARING RESTRICTIONS No  FALLS:  Has patient fallen in last 6 months? Yes. Number of falls 5  LIVING ENVIRONMENT: Lives with: lives with their family and lives alone Lives in: House/apartment Stairs: No Has following equipment at home: Gilford Rile - 4 wheeled, shower chair, Grab bars, and None  OCCUPATION: Comptroller, office work, Government social research officer.  PLOF: Independent  PATIENT GOALS Increase strength, return to his active lifestyle. 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 Travels to attend concerts.   OBJECTIVE:   MUSCLE LENGTH: Hamstrings: Right 62 deg; Left 68 deg   POSTURE: rounded shoulders   LOWER EXTREMITY ROM: WFL throughout, R hip with mild     tightness.   LOWER EXTREMITY MMT:  MMT Right eval Left eval  Right 03/31/22 Left 03/31/22  Hip flexion 4- 3+ 4- 4-  Hip extension 4- 3+ 4- 3+  Hip abduction 4- 3+ 4- 3+  Hip adduction      Hip internal rotation      Hip external rotation      Knee flexion 4 4- 4 4  Knee extension 4 4- 4 4  Ankle dorsiflexion 4 4    Ankle plantarflexion      Ankle inversion      Ankle eversion       (Blank rows = not tested)   FUNCTIONAL TESTS:    5x STS18.22 BERG 43/56   TODAY'S TREATMENT: 04/27/22 STS with OHP red ball 2x10 Crunches on slant 2x10 Standing twists with greenTB 2x10  Knee flex 25# 2x15 Knee ext 20# x15, 25# x15 Shoulder ext 5# 2x10 Resisted gait 20# 4 way x4  04/14/22 Nustep L5 x39mns Knee ext 20# 3x10  HS curls 15# 3x10 Pallof press 5# 2x10  Calf raises on airex 2x12 BlackTB crunches 2x12  Leg press 40# 3x10   04/07/22 Bike L5 x645ms Leg press 40# 2x10  Resisted gait 15# 5x forwards and sidesteps Hip ext 5# 2x10 each side  Crunches w/blackTB 2x15 Back ext black TB 2x15  Gastroc stretch on slant 13mn   04/05/22 Nustep L5 x524ms Bike L4 x5 mins  Walking up and down incline outside x2 Calf raises 2x10 Knee ext 20# 3x10 Knee flex 15# 3x10 Step ups 6" 2x10   03/30/22 Nustep L5 x6m37m 5xSTS (unable to complete 5, stopped at 4 in 20s)  BERG (44/56) Walking 10 laps w/o cane  Cybex flex 15# 2x15 Cybex ext 20# 2x12  03/22/22 Nustep L4 x6mi81mSTS on Airex 2x10 Walking outside on grass Walking down and up hill  Cybex knee flex 25# 2x10 Cybex knee ext 25# 2x10  03/17/22 Bike L4, 6min25mesisted gait 15# forwards and sideways x5 Hip abd and ext 3# 2x10 Side steps on foam Step overs 4" Walking without cane 3 laps   03/15/22 Nustep L5, 6mins38mE forwards/back 6 mins STS elevated table holding red ball 2x10 Crunches with black band 3x10 Step ups forwards and sideways 20reps 4" Standing on foam feet together catch Tandem on foam 30s    03/10/22 Nustep L5, 6mins 26moulder flexion w/dowel 2x10  Bicep curls 4#  2x10 Walking without cane 3 laps CGA Step ups on foam CGA, 2 LOB  Standing on foam playing catch minA  2 way hip ext/abd 2x10       PATIENT EDUCATION:  Education details: POC Person educated: Patient Education method: Explanation, [employer] Verbal cues Education comprehension: verbalized understanding, returned demonstration, verbal cues required, and tactile cues required    Patent educated about keeping lights on during the night for safety and to prevent future falls. Also educated about collection of HEP, and to not do all exercises but rather pick a few to do in order to avoid fatigue.  HOME EXERCISE PROGRAM: [id number]:  CLINICAL IMPRESSION: Patient states he feels has gotten stronger overall and the steroid infusions seem to be working but the effects of it will be more noticeable in a few weeks. Most difficulty and weakness in core, requires [facility] to pull back up.    OBJECTIVE IMPAIRMENTS Abnormal gait, decreased activity tolerance, decreased balance, decreased coordination, decreased endurance, decreased mobility, difficulty walking, decreased strength, improper body mechanics, and postural dysfunction.   ACTIVITY LIMITATIONS carrying, lifting, bending, standing, squatting, stairs, transfers, bed mobility, and locomotion level  PARTICIPATION LIMITATIONS: meal prep, cleaning, laundry, driving, shopping, community activity, occupation, and yard work  PERSONAL FACTORS Past/current experiences are also affecting patient's functional outcome.   REHAB POTENTIAL: Good  CLINICAL DECISION MAKING: Evolving/moderate complexity  EVALUATION COMPLEXITY: Moderate   GOALS: Goals reviewed with patient? Yes  SHORT TERM GOALS: Target date: 03/17/2022   I with basic HEP Baseline: Goal status: MET    LONG TERM GOALS: Target date: 05/12/2022   I with final HEP Baseline:  Goal status: INITIAL  2.  Patient will score at least 50/56 on BERG Balance  Exam Baseline: 43, 76/23 (44) Goal status: IN PROGRESS  3.  Paitent will complete 5x sit to stand in < 12 sec to demonstrate improved strength in BLE. Baseline:  Goal status: IN PROGRESS  4.  Patient will ambulate at least 1000' with LRAD, MI, on level and unlevel surfaces Baseline: 03/31/22 able to walk 1000' w/o AD on even surfaces, CGA due to LOB with fatigue Goal status: IN PROGRESS  5.  Patient will be able to manage cooking and cleaning activities in his home without falling and without requiring a rest break. Baseline:  Goal status: IN PROGRESS   PLAN: PT FREQUENCY: 2x/week  PT DURATION: 12  weeks  PLANNED INTERVENTIONS: Therapeutic exercises, Therapeutic activity, Neuromuscular re-education, Balance training, Gait training, Patient/Family education, Joint mobilization, Stair training, and Manual therapy  PLAN FOR NEXT SESSION: gait and balance activities, core strengthening   Andris Baumann, DPT 04/28/2022, 11:42 AM

## 2022-04-28 ENCOUNTER — Ambulatory Visit: Payer: Medicare Other | Admitting: Occupational Therapy

## 2022-04-28 ENCOUNTER — Encounter: Payer: Self-pay | Admitting: Occupational Therapy

## 2022-04-28 ENCOUNTER — Ambulatory Visit: Payer: Medicare Other | Attending: Physician Assistant

## 2022-04-28 DIAGNOSIS — R2689 Other abnormalities of gait and mobility: Secondary | ICD-10-CM | POA: Diagnosis not present

## 2022-04-28 DIAGNOSIS — M25511 Pain in right shoulder: Secondary | ICD-10-CM | POA: Insufficient documentation

## 2022-04-28 DIAGNOSIS — M6281 Muscle weakness (generalized): Secondary | ICD-10-CM | POA: Insufficient documentation

## 2022-04-28 DIAGNOSIS — M3320 Polymyositis, organ involvement unspecified: Secondary | ICD-10-CM | POA: Diagnosis not present

## 2022-04-28 DIAGNOSIS — R262 Difficulty in walking, not elsewhere classified: Secondary | ICD-10-CM | POA: Insufficient documentation

## 2022-04-28 DIAGNOSIS — M25512 Pain in left shoulder: Secondary | ICD-10-CM | POA: Insufficient documentation

## 2022-04-28 DIAGNOSIS — R2681 Unsteadiness on feet: Secondary | ICD-10-CM | POA: Diagnosis not present

## 2022-04-28 DIAGNOSIS — R293 Abnormal posture: Secondary | ICD-10-CM | POA: Insufficient documentation

## 2022-04-28 DIAGNOSIS — R278 Other lack of coordination: Secondary | ICD-10-CM | POA: Diagnosis not present

## 2022-04-28 DIAGNOSIS — R296 Repeated falls: Secondary | ICD-10-CM | POA: Diagnosis not present

## 2022-04-28 NOTE — Therapy (Signed)
OUTPATIENT OCCUPATIONAL THERAPY TREATMENT NOTE   Patient Name: [name] MRN: [id number] DOB:[birth year], 66 y.o., male Today's Date: 04/28/2022  PCP: Greig Right, MD REFERRING PROVIDER: Cathlyn Parsons, PA-C   END OF SESSION:   OT End of Session - 04/28/22 1020     Visit Number 15    Number of Visits 21    Date for OT Re-Evaluation 05/12/22    Authorization Type BCBS Medicare    Authorization Time Period VL: [place]    OT Start Time 1018    OT Stop Time 1058    OT Time Calculation (min) 40 min    Activity Tolerance Patient tolerated treatment well    Behavior During Therapy WFL for tasks assessed/performed            Past Medical History:  Diagnosis Date   Aortic stenosis    mild AS 02/04/22   Arthritis    Diabetes mellitus    fasting 120s   History of kidney stones    Hypertension    Myocardial infarction ([place])    stent placed   Peripheral neuropathy    left fifth finger, left toe   Pneumonia    hx of   Past Surgical History:  Procedure Laterality Date   APPENDECTOMY     CORONARY STENT INTERVENTION N/A 12/28/2021   Procedure: CORONARY STENT INTERVENTION;  Surgeon: Jettie Booze, MD;  Location: [place] [facility];  Service: Cardiovascular;  Laterality: N/A;   CYSTOSCOPY W/ URETERAL STENT PLACEMENT Bilateral 03/17/2014   Procedure: CYSTOSCOPY WITH RIGHT STENT PULL  ;  Surgeon: Alexis Frock, MD;  [facility];  Service: Urology;  Laterality: Bilateral;   CYSTOSCOPY WITH RETROGRADE PYELOGRAM, URETEROSCOPY AND STENT PLACEMENT Bilateral 02/01/2014   Procedure: CYSTOSCOPY WITH BILATERAL RETROGRADE CYSTOSCOPY, BILATERAL RETROGRADE, PYELOGRAM, RIGHT URETEROSCOPY, STONE BASKETRY,  AND BILATERAL STENT PLACEMENT;  Surgeon: Alexis Frock, MD;  [facility];  Service: Urology;  Laterality: Bilateral;   CYSTOSCOPY WITH URETEROSCOPY AND STENT PLACEMENT Left 02/12/2021   Procedure: CYSTOSCOPY WITH LEFT URETEROSCOPY, BALLOON DILITATION, HOLMIUM LASER  LITHOTRIPSY AND STENT PLACEMENT;  Surgeon: Lucas Mallow, MD;  [facility];  Service: Urology;  Laterality: Left;   HOLMIUM LASER APPLICATION Left 11/07/9415   Procedure: HOLMIUM LASER APPLICATION;  Surgeon: Alexis Frock, MD;  [facility];  Service: Urology;  Laterality: Left;   INTRAVASCULAR ULTRASOUND/IVUS N/A 12/28/2021   Procedure: Intravascular Ultrasound/IVUS;  Surgeon: Jettie Booze, MD;  Location: [place] [facility];  Service: Cardiovascular;  Laterality: N/A;   LEFT HEART CATH AND CORONARY ANGIOGRAPHY N/A 12/28/2021   Procedure: LEFT HEART CATH AND CORONARY ANGIOGRAPHY;  Surgeon: Jettie Booze, MD;  Location: [place] [facility];  Service: Cardiovascular;  Laterality: N/A;   LITHOTRIPSY     MUSCLE BIOPSY Left 04/19/2022   Procedure: LEFT THIGH MUSCLE BIOPSY;  Surgeon: Erroll Luna, MD;  Location: [place];  Service: General;  Laterality: Left;   NEPHROLITHOTOMY Left 03/17/2014   Procedure: NEPHROLITHOTOMY PERCUTANEOUS with access;  Surgeon: Alexis Frock, MD;  [facility];  Service: Urology;  Laterality: Left;   NEPHROLITHOTOMY Left 03/19/2014   Procedure: 2ND STAGE NEPHROLITHOTOMY PERCUTANEOUS left ureteroscopy with lasr stone treatment and stent placement, with access;  Surgeon: Alexis Frock, MD;  [facility];  Service: Urology;  Laterality: Left;   TOTAL KNEE ARTHROPLASTY Right 09/02/2014   Procedure: RIGHT TOTAL KNEE ARTHROPLASTY;  Surgeon: Meredith Pel, MD;  Location: [place];  Service: Orthopedics;  Laterality: Right;   uretral  stent     Patient Active Problem List   Diagnosis Date Noted   Diabetic peripheral neuropathy associated with type 2 diabetes mellitus ([place]) 03/23/2022   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   Gait abnormality 03/07/2022   Cerebrovascular accident (CVA) ([place]) 03/07/2022   Myalgia due to statin 02/28/2022   Mild aortic stenosis 02/28/2022   Polymyositis ([place]) 02/04/2022   Elevated CK 02/02/2022   Elevated LFTs  02/02/2022   CAD (coronary artery disease) 02/02/2022   Muscle weakness of all 4 extremities 02/02/2022   Hyperlipidemia 01/28/2022   Arthritis of knee 09/02/2014   Staghorn kidney stones 03/17/2014   Nephrolithiasis 02/01/2014   Pneumonia 08/27/2011   Diabetes mellitus with coincident hypertension ([place]) 08/27/2011   ONSET DATE: 12/27/21  REFERRING DIAG: M33.20 (ICD-10-CM) - Polymyositis, organ involvement unspecified   THERAPY DIAG:  Muscle weakness (generalized)  Other lack of coordination  Other abnormalities of gait and mobility  Right shoulder pain, unspecified chronicity  Left shoulder pain, unspecified chronicity  Repeated falls  Rationale for Evaluation and Treatment Rehabilitation  PERTINENT HISTORY: Suspected polymyositis; non-ST-elevation myocardial infarction (NSTEMI) s/p percutaneous coronary intervention (PCI) and drug-eluting stent (DES) placement 12/28/21; incidental small silent brainstem stroke (R pons infarct) identified per MRI; cervical spine neuroforaminal stenosis  R shoulder partial to near full-thickness supraspinatus tear; bilateral shoulder pain w/ R > L  Additional PMH: CAD, DMT2, HTN, dyslipidemia, nephrolithiasis, recent transaminitis, h/o stage I prostate cancer, and R TKA in 2016  PRECAUTIONS: Fall; gradual return to driving; don't lift over 10 lbs  SUBJECTIVE:  SUBJECTIVE STATEMENT: No major side effects from the injection; some neuropathy in   PAIN: Are you having pain? 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 OT provided physical assist about 50% of the time for RUE  Rows Single arm rows, emphasizing scapular retraction, using functional gym trainer; 5 lbs; 2x10 on each side. OT provided occ tactile and verbal cues for alignment and positioning throughout. Increased difficulty w/ RUE vs LUE.  Trunk Rotation Alternating trunk rotation holding 6.6 lb med ball w/ elbows flexed at chest level; 2x10 each side     *Rest breaks taken between all exercises and between sets prn due to decreased activity tolerance   PATIENT EDUCATION: Education provided regarding importance of decreasing compensatory shoulder/trunk movements during shoulder ROM and strength exercises Person educated: Patient Education method: Explanation Education comprehension: verbalized understanding   UPPER EXTREMITY ROM     Active ROM Right Eval - 5/25  6/27  7/6  8/3  Shoulder flexion 51 48 52 73  Shoulder abduction 50 68 63 81  Shoulder extension 40     Shoulder IR 80     Shoulder ER 40     Elbow flexion 135     Elbow extension 41 3    (Blank rows = not tested)  Active ROM Left Eval - 5/25  6/27  7/6  8/3  Shoulder flexion 55 79 96 147  Shoulder abduction 58 87    Shoulder extension 47     Shoulder IR 84  Shoulder ER 35     Elbow flexion 138     Elbow extension 6 4    (Blank rows = not tested)   HOME EXERCISE PROGRAM: MedBridge Code: [id number] - Supine Shoulder Flexion AAROM with Hands Clasped  - 2 x daily - 1 sets - 10 reps - 5 sec hold - Shoulder Flexion Wall Slide with Towel  - 2 x daily - 7 x weekly - 2 sets - 5 reps - Seated Shoulder Flexion with Dowel to 90  - 2 x daily - 2-3 sets - 10-15 reps - Seated Shoulder Row with Anchored Resistance  - 2 x daily - 2-3 sets - 10-15 reps - Seated Shoulder External Rotation with Dumbbells  - 2 x daily - 2-3 sets - 10-15 reps - Seated Upper Trapezius Stretch  - 2 x daily - 7 x weekly - 1 sets - 25  reps - Gentle Levator Scapulae Stretch  - 2 x daily - 7 x weekly - 1 sets - 25 reps  GOALS: Goals reviewed with patient? Yes  SHORT TERM GOALS: Target date: 03/31/22  STG  Status:  1 Pt will verbalize understanding of at least 3 fall prevention and/or energy conservation strategies to increase safety w/ functional mobility at home Baseline: Decreased knowledge of strategies Met - 02/24/22  2 Pt will demonstrate independence w/ recommended DME and/or AE (e.g., long-handle sponge, dressing stick, etc.) to improve participation and safety w/ ADLs Baseline: Decreased knowledge of AE Met - 03/31/22  3 Pt will improve functional use of BUEs as evidence by being able to achieve at least 70 degrees of shoulder flexion w/out pain Baseline: RUE: 51 deg, LUE: 55 deg Met - 03/31/22:  RUE: 79 deg,  LUE: 96 deg  4 Pt will improve functional bilateral reach by increasing R elbow extension to at least 25 degrees Baseline: 41* of flexion when attempting extension Met - 03/22/22; WFL    LONG TERM GOALS: Target date: 05/12/22  LTG  Status:  1 Pt will demonstrate independence w/ full HEP designed for ROM and strength of BUEs Baseline: No HEP at this time Met - 04/28/22  2 Pt will improve QuickDASH score by at least 19 points to indicate mild functional impairment Baseline: 59.1 (moderate, borderline severe) impairment) Met - 04/07/22: 22.7  3 Pt will improve functional use of BUEs during grooming tasks (e.g., shampooing hair) as evidenced by improving strength of BUE shoulder flexion to at least 3+/5  Baseline: To be assessed Progressing  4 With both hands, pt will be able to grip w/ at least 20 lbs of force to improve functional use of BUEs during IADLs Baseline: Unable to assess at eval due to medical restrictions Met - 04/07/22: RUE 56 lbs, LUE 73 lbs  5 Pt will be able to retrieve lightweight object (< 5 lbs) from overhead height w/out pain by d/c Baseline: Unable to reach overhead Met - 04/07/22  6 Pt will be  able to retrieve medium-weight object (~5 lbs) from both low and overhead heights by d/c Baseline: Difficulty retrieving cast iron skillet from oven Progressing    ASSESSMENT:  CLINICAL IMPRESSION: Pt returns 2 weeks after prior session on 04/14/22 due to muscle biopsy and steroid infusions; will continue to receive infusions biweekly. Due to time away from therapy, OT reassessed AROM and MMT w/ pt demonstrating 22 deg improvement w/ R shoulder flexion and 92 deg improvement w/ L shoulder flexion since starting OP therapies. Session today focused on continuing to address  BUE and core strengthening for improved safety and participation in functional activities. Pt continues to make good progress toward goals.  PERFORMANCE DEFICITS in functional skills including ADLs, IADLs, ROM, strength, pain, GMC, mobility, balance, body mechanics, endurance, cardiopulmonary status limiting function, decreased knowledge of precautions, decreased knowledge of use of DME, and UE functional use.   IMPAIRMENTS are limiting patient from ADLs, IADLs, rest and sleep, work, leisure, and social participation.   COMORBIDITIES has co-morbidities such as NSTEMI, CAD, and DM  that affects occupational performance. Patient will benefit from skilled OT to address above impairments and improve overall function.   PLAN: OT FREQUENCY: 2x/week  OT DURATION: 10 weeks  PLANNED INTERVENTIONS: self care/ADL training, therapeutic exercise, therapeutic activity, manual therapy, passive range of motion, functional mobility training, aquatic therapy, moist heat, cryotherapy, patient/family education, energy conservation, DME and/or AE instructions, and Re-evaluation  RECOMMENDED OTHER SERVICES: Currently receiving PT services at this location  CONSULTED AND AGREED WITH PLAN OF CARE: Patient  PLAN FOR NEXT SESSION: Continue to focus on core and BUE strength (R more impacted than L)   Kathrine Cords, MSOT, OTR/L 04/28/2022, 12:21 PM

## 2022-05-02 ENCOUNTER — Encounter (HOSPITAL_COMMUNITY): Payer: Self-pay

## 2022-05-02 ENCOUNTER — Ambulatory Visit: Payer: Medicare Other | Admitting: Occupational Therapy

## 2022-05-02 DIAGNOSIS — R296 Repeated falls: Secondary | ICD-10-CM

## 2022-05-02 DIAGNOSIS — R2681 Unsteadiness on feet: Secondary | ICD-10-CM

## 2022-05-02 DIAGNOSIS — M3320 Polymyositis, organ involvement unspecified: Secondary | ICD-10-CM | POA: Diagnosis not present

## 2022-05-02 DIAGNOSIS — M25512 Pain in left shoulder: Secondary | ICD-10-CM

## 2022-05-02 DIAGNOSIS — R278 Other lack of coordination: Secondary | ICD-10-CM | POA: Diagnosis not present

## 2022-05-02 DIAGNOSIS — M25511 Pain in right shoulder: Secondary | ICD-10-CM | POA: Diagnosis not present

## 2022-05-02 DIAGNOSIS — R262 Difficulty in walking, not elsewhere classified: Secondary | ICD-10-CM | POA: Diagnosis not present

## 2022-05-02 DIAGNOSIS — M6281 Muscle weakness (generalized): Secondary | ICD-10-CM

## 2022-05-02 DIAGNOSIS — R293 Abnormal posture: Secondary | ICD-10-CM | POA: Diagnosis not present

## 2022-05-02 DIAGNOSIS — R2689 Other abnormalities of gait and mobility: Secondary | ICD-10-CM | POA: Diagnosis not present

## 2022-05-02 LAB — SURGICAL PATHOLOGY

## 2022-05-02 NOTE — Therapy (Signed)
OUTPATIENT PHYSICAL THERAPY LOWER EXTREMITY TREATMENT   Patient Name: [name] MRN: [id number] DOB:01-31-1956, 66 y.o., male Today's Date: 05/03/2022   PT End of Session - 05/03/22 1020     Visit Number 15    PT Start Time 4098    PT Stop Time 1100    PT Time Calculation (min) 45 min    Activity Tolerance Patient limited by fatigue;Patient tolerated treatment well    Behavior During Therapy [facility] for tasks assessed/performed                   Past Medical History:  Diagnosis Date   Aortic stenosis    mild AS 02/04/22   Arthritis    Diabetes mellitus    fasting 120s   History of kidney stones    Hypertension    Myocardial infarction ([place])    stent placed   Peripheral neuropathy    left fifth finger, left toe   Pneumonia    hx of   Past Surgical History:  Procedure Laterality Date   APPENDECTOMY     CORONARY STENT INTERVENTION N/A 12/28/2021   Procedure: CORONARY STENT INTERVENTION;  Surgeon: Jettie Booze, MD;  Location: [place] [facility];  Service: Cardiovascular;  Laterality: N/A;   CYSTOSCOPY W/ URETERAL STENT PLACEMENT Bilateral 03/17/2014   Procedure: CYSTOSCOPY WITH RIGHT STENT PULL  ;  Surgeon: Alexis Frock, MD;  [facility];  Service: Urology;  Laterality: Bilateral;   CYSTOSCOPY WITH RETROGRADE PYELOGRAM, URETEROSCOPY AND STENT PLACEMENT Bilateral 02/01/2014   Procedure: CYSTOSCOPY WITH BILATERAL RETROGRADE CYSTOSCOPY, BILATERAL RETROGRADE, PYELOGRAM, RIGHT URETEROSCOPY, STONE BASKETRY,  AND BILATERAL STENT PLACEMENT;  Surgeon: Alexis Frock, MD;  [facility];  Service: Urology;  Laterality: Bilateral;   CYSTOSCOPY WITH URETEROSCOPY AND STENT PLACEMENT Left 02/12/2021   Procedure: CYSTOSCOPY WITH LEFT URETEROSCOPY, BALLOON DILITATION, HOLMIUM LASER LITHOTRIPSY AND STENT PLACEMENT;  Surgeon: Lucas Mallow, MD;  [facility];  Service: Urology;  Laterality: Left;   HOLMIUM LASER APPLICATION Left 10/14/1476   Procedure: HOLMIUM  LASER APPLICATION;  Surgeon: Alexis Frock, MD;  [facility];  Service: Urology;  Laterality: Left;   INTRAVASCULAR ULTRASOUND/IVUS N/A 12/28/2021   Procedure: Intravascular Ultrasound/IVUS;  Surgeon: Jettie Booze, MD;  Location: [place] [facility];  Service: Cardiovascular;  Laterality: N/A;   LEFT HEART CATH AND CORONARY ANGIOGRAPHY N/A 12/28/2021   Procedure: LEFT HEART CATH AND CORONARY ANGIOGRAPHY;  Surgeon: Jettie Booze, MD;  Location: [place] [facility];  Service: Cardiovascular;  Laterality: N/A;   LITHOTRIPSY     MUSCLE BIOPSY Left 04/19/2022   Procedure: LEFT THIGH MUSCLE BIOPSY;  Surgeon: Erroll Luna, MD;  Location: [place];  Service: General;  Laterality: Left;   NEPHROLITHOTOMY Left 03/17/2014   Procedure: NEPHROLITHOTOMY PERCUTANEOUS with access;  Surgeon: Alexis Frock, MD;  [facility];  Service: Urology;  Laterality: Left;   NEPHROLITHOTOMY Left 03/19/2014   Procedure: 2ND STAGE NEPHROLITHOTOMY PERCUTANEOUS left ureteroscopy with lasr stone treatment and stent placement, with access;  Surgeon: Alexis Frock, MD;  [facility];  Service: Urology;  Laterality: Left;   TOTAL KNEE ARTHROPLASTY Right 09/02/2014   Procedure: RIGHT TOTAL KNEE ARTHROPLASTY;  Surgeon: Meredith Pel, MD;  Location: [place];  Service: Orthopedics;  Laterality: Right;   uretral stent     Patient Active Problem List   Diagnosis Date Noted   Diabetic peripheral neuropathy associated with type 2 diabetes mellitus ([place]) 03/23/2022   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   Gait  abnormality 03/07/2022   Cerebrovascular accident (CVA) ([place]) 03/07/2022   Myalgia due to statin 02/28/2022   Mild aortic stenosis 02/28/2022   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   Elevated CK 02/02/2022   Elevated LFTs 02/02/2022   CAD (coronary artery disease) 02/02/2022   Muscle weakness of all 4 extremities 02/02/2022   Hyperlipidemia 01/28/2022   Arthritis of knee 09/02/2014   Staghorn kidney stones  03/17/2014   Nephrolithiasis 02/01/2014   Pneumonia 08/27/2011   Diabetes mellitus with coincident hypertension ([place]) 08/27/2011    PCP: Greig Right, MD  REFERRING PROVIDER: Cathlyn Parsons, PA-C   REFERRING DIAG: M33.20 (ICD-10-CM) - Polymyositis, organ involvement unspecified   THERAPY DIAG:  Muscle weakness (generalized)  Other lack of coordination  Polymyositis ([place])  Other abnormalities of gait and mobility  Unsteadiness on feet  Abnormal posture  Difficulty in walking, not elsewhere classified  Repeated falls  Rationale for Evaluation and Treatment Rehabilitation  ONSET DATE: 02/15/2022   SUBJECTIVE:   SUBJECTIVE STATEMENT: Aquatic therapy has been great, I really feel good after doing it. Still feel like I'm getting stronger every day.   PERTINENT HISTORY: [name] is a 66 y.o. right-handed male with history of type 2 diabetes mellitus hypertension intolerance of statin due to abnormal LFTs (off for the past years), CAD status post DES to RCA 12/28/2021 (addition of arterial for statin 20 mg, aspirin/Brilinta),   PAIN:  Are you having pain? No  PRECAUTIONS: None  WEIGHT BEARING RESTRICTIONS No  FALLS:  Has patient fallen in last 6 months? Yes. Number of falls 5  LIVING ENVIRONMENT: Lives with: lives with their family and lives alone Lives in: House/apartment Stairs: No Has following equipment at home: Gilford Rile - 4 wheeled, shower chair, Grab bars, and None  OCCUPATION: Comptroller, office work, Government social research officer.  PLOF: Independent  PATIENT GOALS Increase strength, return to his active lifestyle. 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 Travels to attend concerts.   OBJECTIVE:   MUSCLE LENGTH: Hamstrings: Right 62 deg; Left 68 deg   POSTURE: rounded shoulders   LOWER EXTREMITY ROM: WFL throughout, R hip with mild     tightness.   LOWER EXTREMITY MMT:  MMT Right eval Left eval Right 03/31/22  Left 03/31/22  Hip flexion 4- 3+ 4- 4-  Hip extension 4- 3+ 4- 3+  Hip abduction 4- 3+ 4- 3+  Hip adduction      Hip internal rotation      Hip external rotation      Knee flexion 4 4- 4 4  Knee extension 4 4- 4 4  Ankle dorsiflexion 4 4    Ankle plantarflexion      Ankle inversion      Ankle eversion       (Blank rows = not tested)   FUNCTIONAL TESTS:    5x STS18.22 BERG 43/56   TODAY'S TREATMENT: 05/03/22 Bike L4 x66mins  Seated rows 20# 2x10  Lat pull down 15# 2x10  Leg press 50# 2x10, x10 RLE eccentric  Calf raises on black bar 2x12  Shoulder ER 5# 2x10 2 way hip 5# abd/ext    04/27/22 STS with OHP red ball 2x10 Crunches on slant 2x10 Standing twists with greenTB 2x10  Knee flex 25# 2x15 Knee ext 20# x15, 25# x15 Shoulder ext 5# 2x10 Resisted gait 20# 4 way x4  04/14/22 Nustep L5 x86mins Knee ext 20# 3x10  HS curls 15# 3x10 Pallof press 5# 2x10  Calf raises on airex 2x12  BlackTB crunches 2x12  Leg press 40# 3x10   04/07/22 Bike L5 x76mins Leg press 40# 2x10  Resisted gait 15# 5x forwards and sidesteps Hip ext 5# 2x10 each side  Crunches w/blackTB 2x15 Back ext black TB 2x15 Gastroc stretch on slant 45min   04/05/22 Nustep L5 x63mins Bike L4 x5 mins  Walking up and down incline outside x2 Calf raises 2x10 Knee ext 20# 3x10 Knee flex 15# 3x10 Step ups 6" 2x10   03/30/22 Nustep L5 x19mins 5xSTS (unable to complete 5, stopped at 4 in 20s)  BERG (44/56) Walking 10 laps w/o cane  Cybex flex 15# 2x15 Cybex ext 20# 2x12  03/22/22 Nustep L4 x19mins STS on Airex 2x10 Walking outside on grass Walking down and up hill  Cybex knee flex 25# 2x10 Cybex knee ext 25# 2x10  03/17/22 Bike L4, 47mins Resisted gait 15# forwards and sideways x5 Hip abd and ext 3# 2x10 Side steps on foam Step overs 4" Walking without cane 3 laps   03/15/22 Nustep L5, 13mins UBE forwards/back 6 mins STS elevated table holding red ball 2x10 Crunches with black band 3x10 Step  ups forwards and sideways 20reps 4" Standing on foam feet together catch Tandem on foam 30s    03/10/22 Nustep L5, 17mins  Shoulder flexion w/dowel 2x10  Bicep curls 4# 2x10 Walking without cane 3 laps CGA Step ups on foam CGA, 2 LOB  Standing on foam playing catch minA  2 way hip ext/abd 2x10       PATIENT EDUCATION:  Education details: POC Person educated: Patient Education method: Explanation, Corporate treasurer cues, and Verbal cues Education comprehension: verbalized understanding, returned demonstration, verbal cues required, and tactile cues required    Patent educated about keeping lights on during the night for safety and to prevent future falls. Also educated about collection of HEP, and to not do all exercises but rather pick a few to do in order to avoid fatigue.  HOME EXERCISE PROGRAM: [id number]  ASSESSMENT:  CLINICAL IMPRESSION: Patient continues to work hard with his overall fitness and strength, he is actively doing aquatic therapy every Monday. He still has some endurance Most difficulty with hip exercises using cable machine, compensates once he begins to fatigue. Reviewed his muscle biopsy reports, it shows no active atrophy, necrosis, or inflammation. We continued we strengthening especially on LE to improved mobility and decrease fall risks. Increased fatigue at end of session after leg press.    OBJECTIVE IMPAIRMENTS Abnormal gait, decreased activity tolerance, decreased balance, decreased coordination, decreased endurance, decreased mobility, difficulty walking, decreased strength, improper body mechanics, and postural dysfunction.   ACTIVITY LIMITATIONS carrying, lifting, bending, standing, squatting, stairs, transfers, bed mobility, and locomotion level  PARTICIPATION LIMITATIONS: meal prep, cleaning, laundry, driving, shopping, community activity, occupation, and yard work  PERSONAL FACTORS Past/current experiences are also affecting patient's functional outcome.    REHAB POTENTIAL: Good  CLINICAL DECISION MAKING: Evolving/moderate complexity  EVALUATION COMPLEXITY: Moderate   GOALS: Goals reviewed with patient? Yes  SHORT TERM GOALS: Target date: 03/17/2022   I with basic HEP Baseline: Goal status: MET    LONG TERM GOALS: Target date: 05/12/2022   I with final HEP Baseline:  Goal status: INITIAL  2.  Patient will score at least 50/56 on BERG Balance Exam Baseline: 43, 03/31/22 (44) Goal status: IN PROGRESS  3.  Paitent will complete 5x sit to stand in < 12 sec to demonstrate improved strength in BLE. Baseline:  Goal status: IN PROGRESS  4.  Patient will  ambulate at least 1000' with LRAD, MI, on level and unlevel surfaces Baseline: 03/31/22 able to walk 1000' w/o AD on even surfaces, some fatigue towards end, SBA Goal status: MET  5.  Patient will be able to manage cooking and cleaning activities in his home without falling and without requiring a rest break. Baseline:  Goal status: IN PROGRESS   PLAN: PT FREQUENCY: 2x/week  PT DURATION: 12 weeks  PLANNED INTERVENTIONS: Therapeutic exercises, Therapeutic activity, Neuromuscular re-education, Balance training, Gait training, Patient/Family education, Joint mobilization, Stair training, and Manual therapy  PLAN FOR NEXT SESSION: gait and balance activities, core strengthening   Andris Baumann, DPT 05/03/2022, 11:00 AM

## 2022-05-03 ENCOUNTER — Ambulatory Visit: Payer: Medicare Other

## 2022-05-03 DIAGNOSIS — R293 Abnormal posture: Secondary | ICD-10-CM | POA: Diagnosis not present

## 2022-05-03 DIAGNOSIS — M6281 Muscle weakness (generalized): Secondary | ICD-10-CM

## 2022-05-03 DIAGNOSIS — R2689 Other abnormalities of gait and mobility: Secondary | ICD-10-CM | POA: Diagnosis not present

## 2022-05-03 DIAGNOSIS — R278 Other lack of coordination: Secondary | ICD-10-CM | POA: Diagnosis not present

## 2022-05-03 DIAGNOSIS — M25511 Pain in right shoulder: Secondary | ICD-10-CM | POA: Diagnosis not present

## 2022-05-03 DIAGNOSIS — R296 Repeated falls: Secondary | ICD-10-CM

## 2022-05-03 DIAGNOSIS — R262 Difficulty in walking, not elsewhere classified: Secondary | ICD-10-CM | POA: Diagnosis not present

## 2022-05-03 DIAGNOSIS — M3320 Polymyositis, organ involvement unspecified: Secondary | ICD-10-CM

## 2022-05-03 DIAGNOSIS — R2681 Unsteadiness on feet: Secondary | ICD-10-CM | POA: Diagnosis not present

## 2022-05-03 DIAGNOSIS — M25512 Pain in left shoulder: Secondary | ICD-10-CM | POA: Diagnosis not present

## 2022-05-04 ENCOUNTER — Encounter: Payer: Self-pay | Admitting: Occupational Therapy

## 2022-05-04 NOTE — Therapy (Signed)
OUTPATIENT OCCUPATIONAL THERAPY TREATMENT NOTE   Patient Name: [name] MRN: [id number] DOB:[birth year], 66 y.o., male Today's Date: 05/04/2022  PCP: Greig Right, MD REFERRING PROVIDER: Cathlyn Parsons, PA-C   END OF SESSION:   OT End of Session - 05/04/22 1534     Visit Number 16    Number of Visits 21    Date for OT Re-Evaluation 05/12/22    Authorization Type BCBS Medicare    Authorization Time Period VL: [place]    OT Start Time 1545    OT Stop Time 1630    OT Time Calculation (min) 45 min            Past Medical History:  Diagnosis Date   Aortic stenosis    mild AS 02/04/22   Arthritis    Diabetes mellitus    fasting 120s   History of kidney stones    Hypertension    Myocardial infarction ([place])    stent placed   Peripheral neuropathy    left fifth finger, left toe   Pneumonia    hx of   Past Surgical History:  Procedure Laterality Date   APPENDECTOMY     CORONARY STENT INTERVENTION N/A 12/28/2021   Procedure: CORONARY STENT INTERVENTION;  Surgeon: Jettie Booze, MD;  Location: [facility];  Service: Cardiovascular;  Laterality: N/A;   CYSTOSCOPY W/ URETERAL STENT PLACEMENT Bilateral 03/17/2014   Procedure: CYSTOSCOPY WITH RIGHT STENT PULL  ;  Surgeon: Alexis Frock, MD;  [facility];  Service: Urology;  Laterality: Bilateral;   CYSTOSCOPY WITH RETROGRADE PYELOGRAM, URETEROSCOPY AND STENT PLACEMENT Bilateral 02/01/2014   Procedure: CYSTOSCOPY WITH BILATERAL RETROGRADE CYSTOSCOPY, BILATERAL RETROGRADE, PYELOGRAM, RIGHT URETEROSCOPY, STONE BASKETRY,  AND BILATERAL STENT PLACEMENT;  Surgeon: Alexis Frock, MD;  [facility];  Service: Urology;  Laterality: Bilateral;   CYSTOSCOPY WITH URETEROSCOPY AND STENT PLACEMENT Left 02/12/2021   Procedure: CYSTOSCOPY WITH LEFT URETEROSCOPY, BALLOON DILITATION, HOLMIUM LASER LITHOTRIPSY AND STENT PLACEMENT;  Surgeon: Lucas Mallow, MD;  [facility];  Service: Urology;  Laterality: Left;    HOLMIUM LASER APPLICATION Left 0/48/8891   Procedure: HOLMIUM LASER APPLICATION;  Surgeon: Alexis Frock, MD;  [facility];  Service: Urology;  Laterality: Left;   INTRAVASCULAR ULTRASOUND/IVUS N/A 12/28/2021   Procedure: Intravascular Ultrasound/IVUS;  Surgeon: Jettie Booze, MD;  Location: [facility];  Service: Cardiovascular;  Laterality: N/A;   LEFT HEART CATH AND CORONARY ANGIOGRAPHY N/A 12/28/2021   Procedure: LEFT HEART CATH AND CORONARY ANGIOGRAPHY;  Surgeon: Jettie Booze, MD;  Location: [facility];  Service: Cardiovascular;  Laterality: N/A;   LITHOTRIPSY     MUSCLE BIOPSY Left 04/19/2022   Procedure: LEFT THIGH MUSCLE BIOPSY;  Surgeon: Erroll Luna, MD;  Location: [place];  Service: General;  Laterality: Left;   NEPHROLITHOTOMY Left 03/17/2014   Procedure: NEPHROLITHOTOMY PERCUTANEOUS with access;  Surgeon: Alexis Frock, MD;  [facility];  Service: Urology;  Laterality: Left;   NEPHROLITHOTOMY Left 03/19/2014   Procedure: 2ND STAGE NEPHROLITHOTOMY PERCUTANEOUS left ureteroscopy with lasr stone treatment and stent placement, with access;  Surgeon: Alexis Frock, MD;  [facility];  Service: Urology;  Laterality: Left;   TOTAL KNEE ARTHROPLASTY Right 09/02/2014   Procedure: RIGHT TOTAL KNEE ARTHROPLASTY;  Surgeon: Meredith Pel, MD;  Location: [place];  Service: Orthopedics;  Laterality: Right;   uretral stent     Patient Active Problem List   Diagnosis Date Noted   Diabetic peripheral neuropathy  associated with type 2 diabetes mellitus ([place]) 03/23/2022   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   Gait abnormality 03/07/2022   Cerebrovascular accident (CVA) ([place]) 03/07/2022   Myalgia due to statin 02/28/2022   Mild aortic stenosis 02/28/2022   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   Elevated CK 02/02/2022   Elevated LFTs 02/02/2022   CAD (coronary artery disease) 02/02/2022   Muscle weakness of all 4 extremities 02/02/2022   Hyperlipidemia 01/28/2022    Arthritis of knee 09/02/2014   Staghorn kidney stones 03/17/2014   Nephrolithiasis 02/01/2014   Pneumonia 08/27/2011   Diabetes mellitus with coincident hypertension ([place]) 08/27/2011   ONSET DATE: 12/27/21  REFERRING DIAG: M33.20 (ICD-10-CM) - Polymyositis, organ involvement unspecified   THERAPY DIAG:  Muscle weakness (generalized)  Other lack of coordination  Right shoulder pain, unspecified chronicity  Left shoulder pain, unspecified chronicity  Unsteadiness on feet  Repeated falls  Abnormal posture  Rationale for Evaluation and Treatment Rehabilitation  PERTINENT HISTORY: Suspected polymyositis; non-ST-elevation myocardial infarction (NSTEMI) s/p percutaneous coronary intervention (PCI) and drug-eluting stent (DES) placement 12/28/21; incidental small silent brainstem stroke (R pons infarct) identified per MRI; cervical spine neuroforaminal stenosis  R shoulder partial to near full-thickness supraspinatus tear; bilateral shoulder pain w/ R > L  Additional PMH: CAD, DMT2, HTN, dyslipidemia, nephrolithiasis, recent transaminitis, h/o stage I prostate cancer, and R TKA in 2016  PRECAUTIONS: Fall; gradual return to driving; don't lift over 10 lbs  SUBJECTIVE:  SUBJECTIVE STATEMENT: No major side effects from the injection; some neuropathy in   PAIN: Are you having pain? 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  Patient able to walk on slightly wet pool deck without device barefoot.   Warm up with balance/ coordination - water walking - forward, backward, sidestepping.  Initially walking without arm swing - then adding arms to promote  increased water resistance.  Able to work up to water jogging in 4.5 feet - to address cardiovascular conditioning.  Worked on full body stretching - prone and lateral body - worked on balance and core strength in water with feet out of support.  Worked on Training and development officer to address LE strengthening and balancing in single leg stance, working to improve hip and knee extension with trunk extension.   Worked with lightweight dumbbells for UE conditioning flex/extension, abd/adduction.   Finished with 3 min continuous swim on his back without use of floatation support and use of arms and legs simultaneously.    PATIENT EDUCATION: Education provided regarding importance of decreasing compensatory shoulder/trunk movements during shoulder ROM and strength exercises Person educated: Patient Education method: Explanation Education comprehension: verbalized understanding   UPPER EXTREMITY ROM     Active ROM Right Eval - 5/25  6/27  7/6  8/3  Shoulder flexion 51 48 52 73  Shoulder abduction 50 68 63 81  Shoulder extension 40     Shoulder IR 80     Shoulder ER 40     Elbow flexion 135     Elbow extension 41 3    (Blank rows = not tested)  Active ROM Left Eval - 5/25  6/27  7/6  8/3  Shoulder flexion 55 79 96 147  Shoulder abduction 58 87    Shoulder extension  47     Shoulder IR 84     Shoulder ER 35     Elbow flexion 138     Elbow extension 6 4    (Blank rows = not tested)   HOME EXERCISE PROGRAM: MedBridge Code: [id number] - Supine Shoulder Flexion AAROM with Hands Clasped  - 2 x daily - 1 sets - 10 reps - 5 sec hold - Shoulder Flexion Wall Slide with Towel  - 2 x daily - 7 x weekly - 2 sets - 5 reps - Seated Shoulder Flexion with Dowel to 90  - 2 x daily - 2-3 sets - 10-15 reps - Seated Shoulder Row with Anchored Resistance  - 2 x daily - 2-3 sets - 10-15 reps - Seated Shoulder External Rotation with Dumbbells  - 2 x daily - 2-3 sets - 10-15 reps - Seated Upper Trapezius  Stretch  - 2 x daily - 7 x weekly - 1 sets - 25 reps - Gentle Levator Scapulae Stretch  - 2 x daily - 7 x weekly - 1 sets - 25 reps  GOALS: Goals reviewed with patient? Yes  SHORT TERM GOALS: Target date: 03/31/22  STG  Status:  1 Pt will verbalize understanding of at least 3 fall prevention and/or energy conservation strategies to increase safety w/ functional mobility at home Baseline: Decreased knowledge of strategies Met - 02/24/22  2 Pt will demonstrate independence w/ recommended DME and/or AE (e.g., long-handle sponge, dressing stick, etc.) to improve participation and safety w/ ADLs Baseline: Decreased knowledge of AE Met - 03/31/22  3 Pt will improve functional use of BUEs as evidence by being able to achieve at least 70 degrees of shoulder flexion w/out pain Baseline: RUE: 51 deg, LUE: 55 deg Met - 03/31/22:  RUE: 79 deg,  LUE: 96 deg  4 Pt will improve functional bilateral reach by increasing R elbow extension to at least 25 degrees Baseline: 41* of flexion when attempting extension Met - 03/22/22; WFL    LONG TERM GOALS: Target date: 05/12/22  LTG  Status:  1 Pt will demonstrate independence w/ full HEP designed for ROM and strength of BUEs Baseline: No HEP at this time Met - 04/28/22  2 Pt will improve QuickDASH score by at least 19 points to indicate mild functional impairment Baseline: 59.1 (moderate, borderline severe) impairment) Met - 04/07/22: 22.7  3 Pt will improve functional use of BUEs during grooming tasks (e.g., shampooing hair) as evidenced by improving strength of BUE shoulder flexion to at least 3+/5  Baseline: To be assessed Progressing  4 With both hands, pt will be able to grip w/ at least 20 lbs of force to improve functional use of BUEs during IADLs Baseline: Unable to assess at eval due to medical restrictions Met - 04/07/22: RUE 56 lbs, LUE 73 lbs  5 Pt will be able to retrieve lightweight object (< 5 lbs) from overhead height w/out pain by d/c Baseline: Unable  to reach overhead Met - 04/07/22  6 Pt will be able to retrieve medium-weight object (~5 lbs) from both low and overhead heights by d/c Baseline: Difficulty retrieving cast iron skillet from oven Progressing    ASSESSMENT:  CLINICAL IMPRESSION: Patient reports he was able to get out of bed without railing x 1!  He reports improved energy level overall, and improving functional reach and strength.  Patient continues to benefit from aquatic therapy.    PERFORMANCE DEFICITS in functional skills including ADLs, IADLs, ROM, strength, pain, GMC,  mobility, balance, body mechanics, endurance, cardiopulmonary status limiting function, decreased knowledge of precautions, decreased knowledge of use of DME, and UE functional use.   IMPAIRMENTS are limiting patient from ADLs, IADLs, rest and sleep, work, leisure, and social participation.   COMORBIDITIES has co-morbidities such as NSTEMI, CAD, and DM  that affects occupational performance. Patient will benefit from skilled OT to address above impairments and improve overall function.   PLAN: OT FREQUENCY: 2x/week  OT DURATION: 10 weeks  PLANNED INTERVENTIONS: self care/ADL training, therapeutic exercise, therapeutic activity, manual therapy, passive range of motion, functional mobility training, aquatic therapy, moist heat, cryotherapy, patient/family education, energy conservation, DME and/or AE instructions, and Re-evaluation  RECOMMENDED OTHER SERVICES: Currently receiving PT services at this location  CONSULTED AND AGREED WITH PLAN OF CARE: Patient  PLAN FOR NEXT SESSION: Continue to focus on core and BUE strength (R more impacted than L)   Kathrine Cords, MSOT, OTR/L 05/04/2022, 3:35 PM

## 2022-05-04 NOTE — Therapy (Signed)
OUTPATIENT PHYSICAL THERAPY LOWER EXTREMITY TREATMENT   Patient Name: [name] MRN: [id number] DOB:08-23-1956, 66 y.o., male Today's Date: 05/04/2022           Past Medical History:  Diagnosis Date   Aortic stenosis    mild AS 02/04/22   Arthritis    Diabetes mellitus    fasting 120s   History of kidney stones    Hypertension    Myocardial infarction ([place])    stent placed   Peripheral neuropathy    left fifth finger, left toe   Pneumonia    hx of   Past Surgical History:  Procedure Laterality Date   APPENDECTOMY     CORONARY STENT INTERVENTION N/A 12/28/2021   Procedure: CORONARY STENT INTERVENTION;  Surgeon: Rodney Booze, MD;  Location: [facility];  Service: Cardiovascular;  Laterality: N/A;   CYSTOSCOPY W/ URETERAL STENT PLACEMENT Bilateral 03/17/2014   Procedure: CYSTOSCOPY WITH Gutierrez STENT PULL  ;  Surgeon: Rodney Frock, MD;  [facility];  Service: Urology;  Laterality: Bilateral;   CYSTOSCOPY WITH RETROGRADE PYELOGRAM, URETEROSCOPY AND STENT PLACEMENT Bilateral 02/01/2014   Procedure: CYSTOSCOPY WITH BILATERAL RETROGRADE CYSTOSCOPY, BILATERAL RETROGRADE, PYELOGRAM, Gutierrez URETEROSCOPY, STONE BASKETRY,  AND BILATERAL STENT PLACEMENT;  Surgeon: Rodney Frock, MD;  [facility];  Service: Urology;  Laterality: Bilateral;   CYSTOSCOPY WITH URETEROSCOPY AND STENT PLACEMENT Left 02/12/2021   Procedure: CYSTOSCOPY WITH LEFT URETEROSCOPY, BALLOON DILITATION, HOLMIUM LASER LITHOTRIPSY AND STENT PLACEMENT;  Surgeon: Rodney Mallow, MD;  [facility];  Service: Urology;  Laterality: Left;   HOLMIUM LASER APPLICATION Left 9/83/3825   Procedure: HOLMIUM LASER APPLICATION;  Surgeon: Rodney Frock, MD;  [facility];  Service: Urology;  Laterality: Left;   INTRAVASCULAR ULTRASOUND/IVUS N/A 12/28/2021   Procedure: Intravascular Ultrasound/IVUS;  Surgeon: Rodney Booze, MD;  Location: [facility];  Service: Cardiovascular;   Laterality: N/A;   LEFT HEART CATH AND CORONARY ANGIOGRAPHY N/A 12/28/2021   Procedure: LEFT HEART CATH AND CORONARY ANGIOGRAPHY;  Surgeon: Rodney Booze, MD;  Location: [facility];  Service: Cardiovascular;  Laterality: N/A;   LITHOTRIPSY     MUSCLE BIOPSY Left 04/19/2022   Procedure: LEFT THIGH MUSCLE BIOPSY;  Surgeon: Rodney Luna, MD;  Location: [facility];  Service: General;  Laterality: Left;   NEPHROLITHOTOMY Left 03/17/2014   Procedure: NEPHROLITHOTOMY PERCUTANEOUS with access;  Surgeon: Rodney Frock, MD;  [facility];  Service: Urology;  Laterality: Left;   NEPHROLITHOTOMY Left 03/19/2014   Procedure: 2ND STAGE NEPHROLITHOTOMY PERCUTANEOUS left ureteroscopy with lasr stone treatment and stent placement, with access;  Surgeon: Rodney Frock, MD;  [facility];  Service: Urology;  Laterality: Left;   TOTAL KNEE ARTHROPLASTY Gutierrez 09/02/2014   Procedure: Gutierrez TOTAL KNEE ARTHROPLASTY;  Surgeon: Rodney Pel, MD;  Location: [place] [facility];  Service: Orthopedics;  Laterality: Gutierrez;   uretral stent     Patient Active Problem List   Diagnosis Date Noted   Diabetic peripheral neuropathy associated with type 2 diabetes mellitus ([place]) 03/23/2022   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   Gait abnormality 03/07/2022   Cerebrovascular accident (CVA) ([place]) 03/07/2022   Myalgia due to statin 02/28/2022   Mild aortic stenosis 02/28/2022   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  Rationale for Evaluation and Treatment Rehabilitation  ONSET DATE: 02/15/2022   SUBJECTIVE:   SUBJECTIVE STATEMENT: Aquatic therapy has been great, I really feel good after doing it. Still feel like I'm getting stronger every day.   PERTINENT HISTORY: [name] is a 66 y.o. Gutierrez-handed male with history of type 2 diabetes mellitus hypertension intolerance of statin due to abnormal LFTs (off for the past years), CAD status post DES to RCA 12/28/2021 (addition of arterial for statin 20 mg, aspirin/Brilinta),   PAIN:  Are you having pain? No  PRECAUTIONS: None  WEIGHT BEARING RESTRICTIONS No  FALLS:  Has patient fallen in last 6 months? Yes. Number of falls 5  LIVING ENVIRONMENT: Lives with: lives with their family and lives alone Lives in: House/apartment Stairs: No Has following equipment at home: Gilford Rile - 4 wheeled, shower chair, Grab bars, and None  OCCUPATION: Comptroller, office work, Government social research officer.  PLOF: Independent  PATIENT GOALS Increase strength, return to his active lifestyle. 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 Travels to attend concerts.   OBJECTIVE:   MUSCLE LENGTH: Hamstrings: Gutierrez 62 deg; Left 68 deg   POSTURE: rounded shoulders   LOWER EXTREMITY ROM: WFL throughout, R hip with mild     tightness.   LOWER EXTREMITY MMT:  MMT Gutierrez eval Left eval Gutierrez 03/31/22 Left 03/31/22  Hip flexion 4- 3+ 4- 4-  Hip extension 4- 3+ 4- 3+  Hip abduction 4- 3+ 4- 3+  Hip adduction      Hip internal rotation      Hip external rotation      Knee flexion 4 4- 4 4  Knee extension 4 4- 4 4  Ankle dorsiflexion 4 4    Ankle plantarflexion      Ankle inversion      Ankle eversion       (Blank rows = not tested)   FUNCTIONAL TESTS:    5x STS18.22 BERG 43/56   TODAY'S TREATMENT: 05/04/22 Nustep Lateral band walks  Step ups 6"  Standing marches 5#  Step over  obstacles 5#  Walking on beam  STS   05/03/22 Bike L4 x97mns  Seated rows 20# 2x10  Lat pull down 15# 2x10  Leg press 50# 2x10, x10 RLE eccentric  Calf raises on black bar 2x12  Shoulder ER 5# 2x10 2 way hip 5# abd/ext    04/27/22 STS with OHP red ball 2x10 Crunches on slant 2x10 Standing twists with greenTB 2x10  Knee flex 25# 2x15 Knee ext 20# x15, 25# x15 Shoulder ext 5# 2x10 Resisted gait 20# 4 way x4  04/14/22 Nustep L5 x646ms Knee ext 20# 3x10  HS curls 15# 3x10 Pallof press 5# 2x10  Calf raises on airex 2x12 BlackTB crunches 2x12  Leg press 40# 3x10   04/07/22 Bike L5 x6m75m Leg press 40# 2x10  Resisted gait 15# 5x forwards and sidesteps Hip ext 5# 2x10 each side  Crunches w/blackTB 2x15 Back ext black TB 2x15 Gastroc stretch on slant 1mi20m 04/05/22 Nustep L5 x5min19mike L4 x5 mins  Walking up and down incline outside x2 Calf raises 2x10 Knee ext 20# 3x10 Knee flex 15# 3x10 Step ups  6" 2x10   03/30/22 Nustep L5 x31mns 5xSTS (unable to complete 5, stopped at 4 in 20s)  BERG (44/56) Walking 10 laps w/o cane  Cybex flex 15# 2x15 Cybex ext 20# 2x12  03/22/22 Nustep L4 x652ms STS on Airex 2x10 Walking outside on grass Walking down and up hill  Cybex knee flex 25# 2x10 Cybex knee ext 25# 2x10  03/17/22 Bike L4, 93m92m Resisted gait 15# forwards and sideways x5 Hip abd and ext 3# 2x10 Side steps on foam Step overs 4" Walking without cane 3 laps   03/15/22 Nustep L5, 93mi54mUBE forwards/back 6 mins STS elevated table holding red ball 2x10 Crunches with black band 3x10 Step ups forwards and sideways 20reps 4" Standing on foam feet together catch Tandem on foam 30s    03/10/22 Nustep L5, 93min25mShoulder flexion w/dowel 2x10  Bicep curls 4# 2x10 Walking without cane 3 laps CGA Step ups on foam CGA, 2 LOB  Standing on foam playing catch minA  2 way hip ext/abd 2x10       PATIENT EDUCATION:  Education details: POC Person educated:  Patient Education method: Explanation, TactiCorporate treasurer, and Verbal cues Education comprehension: verbalized understanding, returned demonstration, verbal cues required, and tactile cues required    Patent educated about keeping lights on during the night for safety and to prevent future falls. Also educated about collection of HEP, and to not do all exercises but rather pick a few to do in order to avoid fatigue.  HOME EXERCISE PROGRAM: [id number]:  CLINICAL IMPRESSION: Patient continues to work hard with his overall fitness and strength, he is actively doing aquatic therapy every Monday. He still has some endurance Most difficulty with hip exercises using cable machine, compensates once he begins to fatigue. Reviewed his muscle biopsy reports, it shows no active atrophy, necrosis, or inflammation. We continued we strengthening especially on LE to improved mobility and decrease fall risks. Increased fatigue at end of session after leg press.    OBJECTIVE IMPAIRMENTS Abnormal gait, decreased activity tolerance, decreased balance, decreased coordination, decreased endurance, decreased mobility, difficulty walking, decreased strength, improper body mechanics, and postural dysfunction.   ACTIVITY LIMITATIONS carrying, lifting, bending, standing, squatting, stairs, transfers, bed mobility, and locomotion level  PARTICIPATION LIMITATIONS: meal prep, cleaning, laundry, driving, shopping, community activity, occupation, and yard work  PERSONAL FACTORS Past/current experiences are also affecting patient's functional outcome.   REHAB POTENTIAL: Good  CLINICAL DECISION MAKING: Evolving/moderate complexity  EVALUATION COMPLEXITY: Moderate   GOALS: Goals reviewed with patient? Yes  SHORT TERM GOALS: Target date: 03/17/2022   I with basic HEP Baseline: Goal status: MET    LONG TERM GOALS: Target date: 05/12/2022   I with final HEP Baseline:  Goal status: INITIAL  2.  Patient will  score at least 50/56 on BERG Balance Exam Baseline: 43, 03/31/22 (44) Goal status: IN PROGRESS  3.  Paitent will complete 5x sit to stand in < 12 sec to demonstrate improved strength in BLE. Baseline:  Goal status: IN PROGRESS  4.  Patient will ambulate at least 1000' with LRAD, MI, on level and unlevel surfaces Baseline: 03/31/22 able to walk 1000' w/o AD on even surfaces, some fatigue towards end, SBA Goal status: MET  5.  Patient will be able to manage cooking and cleaning activities in his home without falling and without requiring a rest break. Baseline:  Goal status: IN PROGRESS   PLAN: PT FREQUENCY: 2x/week  PT DURATION: 12 weeks  PLANNED INTERVENTIONS: Therapeutic exercises,  Therapeutic activity, Neuromuscular re-education, Balance training, Gait training, Patient/Family education, Joint mobilization, Stair training, and Manual therapy  PLAN FOR NEXT SESSION: gait and balance activities, core strengthening   Andris Baumann, DPT 05/04/2022, 3:36 PM

## 2022-05-05 ENCOUNTER — Ambulatory Visit: Payer: Medicare Other

## 2022-05-05 ENCOUNTER — Encounter: Payer: Self-pay | Admitting: Occupational Therapy

## 2022-05-05 ENCOUNTER — Ambulatory Visit: Payer: Medicare Other | Admitting: Occupational Therapy

## 2022-05-05 DIAGNOSIS — M6281 Muscle weakness (generalized): Secondary | ICD-10-CM | POA: Diagnosis not present

## 2022-05-05 DIAGNOSIS — R2681 Unsteadiness on feet: Secondary | ICD-10-CM

## 2022-05-05 DIAGNOSIS — R296 Repeated falls: Secondary | ICD-10-CM | POA: Diagnosis not present

## 2022-05-05 DIAGNOSIS — R262 Difficulty in walking, not elsewhere classified: Secondary | ICD-10-CM | POA: Diagnosis not present

## 2022-05-05 DIAGNOSIS — R293 Abnormal posture: Secondary | ICD-10-CM

## 2022-05-05 DIAGNOSIS — M25512 Pain in left shoulder: Secondary | ICD-10-CM

## 2022-05-05 DIAGNOSIS — R2689 Other abnormalities of gait and mobility: Secondary | ICD-10-CM | POA: Diagnosis not present

## 2022-05-05 DIAGNOSIS — R278 Other lack of coordination: Secondary | ICD-10-CM

## 2022-05-05 DIAGNOSIS — M25511 Pain in right shoulder: Secondary | ICD-10-CM | POA: Diagnosis not present

## 2022-05-05 DIAGNOSIS — M3320 Polymyositis, organ involvement unspecified: Secondary | ICD-10-CM

## 2022-05-05 NOTE — Therapy (Signed)
OUTPATIENT OCCUPATIONAL THERAPY TREATMENT NOTE   Patient Name: [name] MRN: [id number] DOB:26-Oct-1955, 66 y.o., male Today's Date: 05/05/2022  PCP: Greig Right, MD REFERRING PROVIDER: Cathlyn Parsons, PA-C   END OF SESSION:   OT End of Session - 05/05/22 1025     Visit Number 17    Number of Visits 21    Date for OT Re-Evaluation 05/12/22    Authorization Type BCBS Medicare    Authorization Time Period VL: [place]    OT Start Time 1019    OT Stop Time 1055    OT Time Calculation (min) 36 min    Activity Tolerance Patient tolerated treatment well    Behavior During Therapy WFL for tasks assessed/performed            Past Medical History:  Diagnosis Date   Aortic stenosis    mild AS 02/04/22   Arthritis    Diabetes mellitus    fasting 120s   History of kidney stones    Hypertension    Myocardial infarction ([place])    stent placed   Peripheral neuropathy    left fifth finger, left toe   Pneumonia    hx of   Past Surgical History:  Procedure Laterality Date   APPENDECTOMY     CORONARY STENT INTERVENTION N/A 12/28/2021   Procedure: CORONARY STENT INTERVENTION;  Surgeon: Jettie Booze, MD;  Location: [facility];  Service: Cardiovascular;  Laterality: N/A;   CYSTOSCOPY W/ URETERAL STENT PLACEMENT Bilateral 03/17/2014   Procedure: CYSTOSCOPY WITH RIGHT STENT PULL  ;  Surgeon: Alexis Frock, MD;  [facility];  Service: Urology;  Laterality: Bilateral;   CYSTOSCOPY WITH RETROGRADE PYELOGRAM, URETEROSCOPY AND STENT PLACEMENT Bilateral 02/01/2014   Procedure: CYSTOSCOPY WITH BILATERAL RETROGRADE CYSTOSCOPY, BILATERAL RETROGRADE, PYELOGRAM, RIGHT URETEROSCOPY, STONE BASKETRY,  AND BILATERAL STENT PLACEMENT;  Surgeon: Alexis Frock, MD;  [facility];  Service: Urology;  Laterality: Bilateral;   CYSTOSCOPY WITH URETEROSCOPY AND STENT PLACEMENT Left 02/12/2021   Procedure: CYSTOSCOPY WITH LEFT URETEROSCOPY, BALLOON DILITATION, HOLMIUM LASER  LITHOTRIPSY AND STENT PLACEMENT;  Surgeon: Lucas Mallow, MD;  [facility];  Service: Urology;  Laterality: Left;   HOLMIUM LASER APPLICATION Left 0/25/8527   Procedure: HOLMIUM LASER APPLICATION;  Surgeon: Alexis Frock, MD;  [facility];  Service: Urology;  Laterality: Left;   INTRAVASCULAR ULTRASOUND/IVUS N/A 12/28/2021   Procedure: Intravascular Ultrasound/IVUS;  Surgeon: Jettie Booze, MD;  Location: [facility];  Service: Cardiovascular;  Laterality: N/A;   LEFT HEART CATH AND CORONARY ANGIOGRAPHY N/A 12/28/2021   Procedure: LEFT HEART CATH AND CORONARY ANGIOGRAPHY;  Surgeon: Jettie Booze, MD;  Location: [facility];  Service: Cardiovascular;  Laterality: N/A;   LITHOTRIPSY     MUSCLE BIOPSY Left 04/19/2022   Procedure: LEFT THIGH MUSCLE BIOPSY;  Surgeon: Erroll Luna, MD;  Location: [facility];  Service: General;  Laterality: Left;   NEPHROLITHOTOMY Left 03/17/2014   Procedure: NEPHROLITHOTOMY PERCUTANEOUS with access;  Surgeon: Alexis Frock, MD;  [facility];  Service: Urology;  Laterality: Left;   NEPHROLITHOTOMY Left 03/19/2014   Procedure: 2ND STAGE NEPHROLITHOTOMY PERCUTANEOUS left ureteroscopy with lasr stone treatment and stent placement, with access;  Surgeon: Alexis Frock, MD;  [facility];  Service: Urology;  Laterality: Left;   TOTAL KNEE ARTHROPLASTY Right 09/02/2014   Procedure: RIGHT TOTAL KNEE ARTHROPLASTY;  Surgeon: Meredith Pel, MD;  Location: [facility];  Service: Orthopedics;  Laterality: Right;   uretral  stent     Patient Active Problem List   Diagnosis Date Noted   Diabetic peripheral neuropathy associated with type 2 diabetes mellitus ([place]) 03/23/2022   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   Gait abnormality 03/07/2022   Cerebrovascular accident (CVA) ([place]) 03/07/2022   Myalgia due to statin 02/28/2022   Mild aortic stenosis 02/28/2022   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/2022   Elevated CK 02/02/2022   Elevated LFTs  02/02/2022   CAD (coronary artery disease) 02/02/2022   Muscle weakness of all 4 extremities 02/02/2022   Hyperlipidemia 01/28/2022   Arthritis of knee 09/02/2014   Staghorn kidney stones 03/17/2014   Nephrolithiasis 02/01/2014   Pneumonia 08/27/2011   Diabetes mellitus with coincident hypertension ([place]) 08/27/2011   ONSET DATE: 12/27/21  REFERRING DIAG: M33.20 (ICD-10-CM) - Polymyositis, organ involvement unspecified   THERAPY DIAG:  Muscle weakness (generalized)  Other lack of coordination  Right shoulder pain, unspecified chronicity  Left shoulder pain, unspecified chronicity  Other abnormalities of gait and mobility  Rationale for Evaluation and Treatment Rehabilitation  PERTINENT HISTORY: Suspected polymyositis; non-ST-elevation myocardial infarction (NSTEMI) s/p percutaneous coronary intervention (PCI) and drug-eluting stent (DES) placement 12/28/21; incidental small silent brainstem stroke (R pons infarct) identified per MRI; cervical spine neuroforaminal stenosis  R shoulder partial to near full-thickness supraspinatus tear; bilateral shoulder pain w/ R > L  Additional PMH: CAD, DMT2, HTN, dyslipidemia, nephrolithiasis, recent transaminitis, h/o stage I prostate cancer, and R TKA in 2016  PRECAUTIONS: Fall; gradual return to driving; don't lift over 10 lbs  SUBJECTIVE:  SUBJECTIVE STATEMENT: Pt reports getting in the pool has been his favorite thing since starting therapy; feels like he has been getting a lot stronger and will review the results of his recent biopsy w/ neurology in about 2 weeks  PAIN: Are you having pain? 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, chartered at the [employer]; travels for bands/festivals; yardwork  OBJECTIVE:   HAND DOMINANCE: Left  TODAY'S TREATMENT - 05/05/22:  UBE Reciprocal  arm bike for conditioning and BUE strengthening on Level 3.0; 6 minutes total (3 forward, 3 back)  [employer] and catching 4.4 lb med ball (yellow) against the wall underhand w/ BUEs; completed 3x10 w/ good technique  Overhand single-arm toss and catch w/ 4.4 lb med ball (yellow) completed 2x10 against the wall w/ each UE; slightly increased difficulty/compensatory patterns observed w/ RUE compared to LUE  Core Strengthening Anterior weight shift to tap top of therapy cone on floor level while holding 6.6 lb med ball (blue) and then returning to upright positioning; completed 3x10 w/ OT providing verbal cues during first set to prevent compensatory shoulder hiking w/ good carryover between sets  Alternating trunk rotation holding 6.6 lb med ball w/ elbows flexed at chest level; 2x10 each side w/out difficulty  Soft Tissue Massage Gentle soft tissue mobilization w/ lotion to bil upper traps, approx 3 min each side, in order to facilitate mobility within tissue structures and decrease tension for increased mobility; pt positioned sitting upright and tolerated w/out discomfort     *Rest breaks taken between all exercises and between sets prn due to decreased activity tolerance   PATIENT EDUCATION: Ongoing condition-specific education related to therapeutic interventions completed this session Person educated: Patient Education method: Explanation Education comprehension: verbalized understanding   UPPER EXTREMITY ROM     Active ROM Right Eval - 5/25  6/27  7/6  8/3  Shoulder flexion 51 48 52 73  Shoulder abduction 50 68 63 81  Shoulder extension 40     Shoulder IR 80     Shoulder ER 40     Elbow flexion 135     Elbow extension 41 3    (Blank rows = not tested)  Active ROM Left Eval - 5/25  6/27  7/6  8/3  Shoulder flexion 55 79 96 147  Shoulder abduction 58 87    Shoulder extension 47     Shoulder IR 84     Shoulder ER 35     Elbow flexion 138     Elbow extension 6 4     (Blank rows = not tested)   HOME EXERCISE PROGRAM: MedBridge Code: [id number] - Supine Shoulder Flexion AAROM with Hands Clasped  - 2 x daily - 1 sets - 10 reps - 5 sec hold - Shoulder Flexion Wall Slide with Towel  - 2 x daily - 7 x weekly - 2 sets - 5 reps - Seated Shoulder Flexion with Dowel to 90  - 2 x daily - 2-3 sets - 10-15 reps - Seated Shoulder Row with Anchored Resistance  - 2 x daily - 2-3 sets - 10-15 reps - Seated Shoulder External Rotation with Dumbbells  - 2 x daily - 2-3 sets - 10-15 reps - Seated Upper Trapezius Stretch  - 2 x daily - 7 x weekly - 1 sets - 25 reps - Gentle Levator Scapulae Stretch  - 2 x daily - 7 x weekly - 1 sets - 25 reps  GOALS: Goals reviewed with patient? Yes  SHORT TERM GOALS: Target date: 03/31/22  STG  Status:  1 Pt will verbalize understanding of at least 3 fall prevention and/or energy conservation strategies to increase safety w/ functional mobility at home Baseline: Decreased knowledge of strategies Met - 02/24/22  2 Pt will demonstrate independence w/ recommended DME and/or AE (e.g., long-handle sponge, dressing stick, etc.) to improve participation and safety w/ ADLs Baseline: Decreased knowledge of AE Met - 03/31/22  3 Pt will improve functional use of BUEs as evidence by being able to achieve at least 70 degrees of shoulder flexion w/out pain Baseline: RUE: 51 deg, LUE: 55 deg Met - 03/31/22:  RUE: 79 deg,  LUE: 96 deg  4 Pt will improve functional bilateral reach by increasing R elbow extension to at least 25 degrees Baseline: 41* of flexion when attempting extension Met - 03/22/22; WFL    LONG TERM GOALS: Target date: 05/12/22  LTG  Status:  1 Pt will demonstrate independence w/ full HEP designed for ROM and strength of BUEs Baseline: No HEP at this time Met - 04/28/22  2 Pt will improve QuickDASH score by at least 19 points to indicate mild functional impairment Baseline: 59.1 (moderate, borderline severe) impairment) Met - 04/07/22:  22.7  3 Pt will improve functional use of BUEs during grooming tasks (e.g., shampooing hair) as evidenced by improving strength of BUE shoulder flexion to at least 3+/5  Baseline: To be assessed Progressing  4 With both hands, pt will be able to grip w/ at least 20 lbs of force to improve functional use of BUEs during IADLs Baseline: Unable to assess at eval due to medical restrictions Met - 04/07/22: RUE 56 lbs, LUE 73 lbs  5 Pt will be able to retrieve lightweight object (< 5 lbs) from overhead height w/out pain by d/c Baseline: Unable to reach overhead Met -  04/07/22  6 Pt will be able to retrieve medium-weight object (~5 lbs) from both low and overhead heights by d/c Baseline: Difficulty retrieving cast iron skillet from oven Progressing    ASSESSMENT:  CLINICAL IMPRESSION: Pt continues to make good progress toward goals and demonstrates improvements each session. Treatment today continued focus on BUE strengthening and core stability for improved safety and independence w/ higher level IADL and leisure tasks. Pt is encouraged by progress w/ OT discussing continued POC and interpretation of objective changes and improvements.  PERFORMANCE DEFICITS in functional skills including ADLs, IADLs, ROM, strength, pain, GMC, mobility, balance, body mechanics, endurance, cardiopulmonary status limiting function, decreased knowledge of precautions, decreased knowledge of use of DME, and UE functional use.   IMPAIRMENTS are limiting patient from ADLs, IADLs, rest and sleep, work, leisure, and social participation.   COMORBIDITIES has co-morbidities such as NSTEMI, CAD, and DM  that affects occupational performance. Patient will benefit from skilled OT to address above impairments and improve overall function.   PLAN: OT FREQUENCY: 2x/week  OT DURATION: 10 weeks  PLANNED INTERVENTIONS: self care/ADL training, therapeutic exercise, therapeutic activity, manual therapy, passive range of motion,  functional mobility training, aquatic therapy, moist heat, cryotherapy, patient/family education, energy conservation, DME and/or AE instructions, and Re-evaluation  RECOMMENDED OTHER SERVICES: Currently receiving PT services at this location  CONSULTED AND AGREED WITH PLAN OF CARE: Patient  PLAN FOR NEXT SESSION: Continue to focus on core and BUE strength (R more impacted than L)   Kathrine Cords, MSOT, OTR/L 05/05/2022, 10:56 AM

## 2022-05-09 NOTE — Therapy (Incomplete)
OUTPATIENT PHYSICAL THERAPY LOWER EXTREMITY TREATMENT   Patient Name: [name] MRN: [id number] DOB:02-08-1956, 66 y.o., male Today's Date: 05/09/2022            Past Medical History:  Diagnosis Date   Aortic stenosis    mild AS 02/04/22   Arthritis    Diabetes mellitus    fasting 120s   History of kidney stones    Hypertension    Myocardial infarction ([place])    stent placed   Peripheral neuropathy    left fifth finger, left toe   Pneumonia    hx of   Past Surgical History:  Procedure Laterality Date   APPENDECTOMY     CORONARY STENT INTERVENTION N/A 12/28/2021   Procedure: CORONARY STENT INTERVENTION;  Surgeon: Jettie Booze, MD;  Location: [facility];  Service: Cardiovascular;  Laterality: N/A;   CYSTOSCOPY W/ URETERAL STENT PLACEMENT Bilateral 03/17/2014   Procedure: CYSTOSCOPY WITH RIGHT STENT PULL  ;  Surgeon: Alexis Frock, MD;  [facility];  Service: Urology;  Laterality: Bilateral;   CYSTOSCOPY WITH RETROGRADE PYELOGRAM, URETEROSCOPY AND STENT PLACEMENT Bilateral 02/01/2014   Procedure: CYSTOSCOPY WITH BILATERAL RETROGRADE CYSTOSCOPY, BILATERAL RETROGRADE, PYELOGRAM, RIGHT URETEROSCOPY, STONE BASKETRY,  AND BILATERAL STENT PLACEMENT;  Surgeon: Alexis Frock, MD;  [facility];  Service: Urology;  Laterality: Bilateral;   CYSTOSCOPY WITH URETEROSCOPY AND STENT PLACEMENT Left 02/12/2021   Procedure: CYSTOSCOPY WITH LEFT URETEROSCOPY, BALLOON DILITATION, HOLMIUM LASER LITHOTRIPSY AND STENT PLACEMENT;  Surgeon: Lucas Mallow, MD;  [facility];  Service: Urology;  Laterality: Left;   HOLMIUM LASER APPLICATION Left 5/53/7482   Procedure: HOLMIUM LASER APPLICATION;  Surgeon: Alexis Frock, MD;  [facility];  Service: Urology;  Laterality: Left;   INTRAVASCULAR ULTRASOUND/IVUS N/A 12/28/2021   Procedure: Intravascular Ultrasound/IVUS;  Surgeon: Jettie Booze, MD;  Location: [facility];  Service: Cardiovascular;   Laterality: N/A;   LEFT HEART CATH AND CORONARY ANGIOGRAPHY N/A 12/28/2021   Procedure: LEFT HEART CATH AND CORONARY ANGIOGRAPHY;  Surgeon: Jettie Booze, MD;  Location: [facility];  Service: Cardiovascular;  Laterality: N/A;   LITHOTRIPSY     MUSCLE BIOPSY Left 04/19/2022   Procedure: LEFT THIGH MUSCLE BIOPSY;  Surgeon: Erroll Luna, MD;  Location: [facility];  Service: General;  Laterality: Left;   NEPHROLITHOTOMY Left 03/17/2014   Procedure: NEPHROLITHOTOMY PERCUTANEOUS with access;  Surgeon: Alexis Frock, MD;  [facility];  Service: Urology;  Laterality: Left;   NEPHROLITHOTOMY Left 03/19/2014   Procedure: 2ND STAGE NEPHROLITHOTOMY PERCUTANEOUS left ureteroscopy with lasr stone treatment and stent placement, with access;  Surgeon: Alexis Frock, MD;  [facility];  Service: Urology;  Laterality: Left;   TOTAL KNEE ARTHROPLASTY Right 09/02/2014   Procedure: RIGHT TOTAL KNEE ARTHROPLASTY;  Surgeon: Meredith Pel, MD;  Location: [facility];  Service: Orthopedics;  Laterality: Right;   uretral stent     Patient Active Problem List   Diagnosis Date Noted   Diabetic peripheral neuropathy associated with type 2 diabetes mellitus ([place]) 03/23/2022   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   Gait abnormality 03/07/2022   Cerebrovascular accident (CVA) ([place]) 03/07/2022   Myalgia due to statin 02/28/2022   Mild aortic stenosis 02/28/2022   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  Rationale for Evaluation and Treatment Rehabilitation  ONSET DATE: 02/15/2022   SUBJECTIVE:   SUBJECTIVE STATEMENT: Doing pretty good, went to a concert at the beach this weekend.   PERTINENT HISTORY: [name] is a 66 y.o. right-handed male with history of type 2 diabetes mellitus hypertension intolerance of statin due to abnormal LFTs (off for the past years), CAD status post DES to RCA 12/28/2021 (addition of arterial for statin 20 mg, aspirin/Brilinta),   PAIN:  Are you having pain? No  PRECAUTIONS: None  WEIGHT BEARING RESTRICTIONS No  FALLS:  Has patient fallen in last 6 months? Yes. Number of falls 5  LIVING ENVIRONMENT: Lives with: lives with their family and lives alone Lives in: House/apartment Stairs: No Has following equipment at home: Gilford Rile - 4 wheeled, shower chair, Grab bars, and None  OCCUPATION: Comptroller, office work, Government social research officer.  PLOF: Independent  PATIENT GOALS Increase strength, return to his active lifestyle. 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 Travels to attend concerts.   OBJECTIVE:   MUSCLE LENGTH: Hamstrings: Right 62 deg; Left 68 deg   POSTURE: rounded shoulders   LOWER EXTREMITY ROM: WFL throughout, R hip with mild     tightness.   LOWER EXTREMITY MMT:  MMT Right eval Left eval Right 03/31/22 Left 03/31/22  Hip flexion 4- 3+ 4- 4-  Hip extension 4- 3+ 4- 3+  Hip abduction 4- 3+ 4- 3+  Hip adduction      Hip internal rotation      Hip external rotation      Knee flexion 4 4- 4 4  Knee extension 4 4- 4 4  Ankle dorsiflexion 4 4    Ankle plantarflexion      Ankle inversion      Ankle eversion       (Blank rows = not tested)   FUNCTIONAL TESTS:    5x STS18.22 BERG 43/56   TODAY'S TREATMENT: 05/10/22 Nustep L5 x28mins  Walk outside 1 big loop Resisted gait 20# 4x4 way Pallof press 10# 2x10 Leg ext 25# 3x10 HS curls 25#  3x10 Seated rows 25# 2x10 Lat pull downs 20# 2x10    05/04/22 Bike L5 x7mins  Lateral band walks greenTB  Step ups 6" 2x10 bilat Standing marches 5# 2x10  Step over half foam 5# 2x10 Walking on beam forwards and side steps   05/03/22 Bike L4 x66mins  Seated rows 20# 2x10  Lat pull down 15# 2x10  Leg press 50# 2x10, x10 RLE eccentric  Calf raises on black bar 2x12  Shoulder ER 5# 2x10 2 way hip 5# abd/ext    04/27/22 STS with OHP red ball 2x10 Crunches on slant 2x10 Standing twists with greenTB 2x10  Knee flex 25# 2x15 Knee ext 20# x15, 25# x15 Shoulder ext 5# 2x10 Resisted gait 20# 4 way x4  04/14/22 Nustep L5 x11mins Knee ext 20# 3x10  HS curls 15# 3x10 Pallof press 5# 2x10  Calf raises on airex 2x12 BlackTB crunches 2x12  Leg press 40# 3x10   04/07/22 Bike L5 x6mins Leg press 40# 2x10  Resisted gait 15# 5x forwards and sidesteps Hip ext 5# 2x10 each side  Crunches  w/blackTB 2x15 Back ext black TB 2x15 Gastroc stretch on slant 37min   04/05/22 Nustep L5 x38mins Bike L4 x5 mins  Walking up and down incline outside x2 Calf raises 2x10 Knee ext 20# 3x10 Knee flex 15# 3x10 Step ups 6" 2x10   03/30/22 Nustep L5 x95mins 5xSTS (unable to complete 5, stopped at 4 in 20s)  BERG (44/56) Walking 10 laps w/o cane  Cybex flex 15# 2x15 Cybex ext 20# 2x12  03/22/22 Nustep L4 x73mins STS on Airex 2x10 Walking outside on grass Walking down and up hill  Cybex knee flex 25# 2x10 Cybex knee ext 25# 2x10  03/17/22 Bike L4, 38mins Resisted gait 15# forwards and sideways x5 Hip abd and ext 3# 2x10 Side steps on foam Step overs 4" Walking without cane 3 laps   03/15/22 Nustep L5, 33mins UBE forwards/back 6 mins STS elevated table holding red ball 2x10 Crunches with black band 3x10 Step ups forwards and sideways 20reps 4" Standing on foam feet together catch Tandem on foam 30s    03/10/22 Nustep L5, 35mins  Shoulder flexion w/dowel 2x10  Bicep curls 4#  2x10 Walking without cane 3 laps CGA Step ups on foam CGA, 2 LOB  Standing on foam playing catch minA  2 way hip ext/abd 2x10       PATIENT EDUCATION:  Education details: POC Person educated: Patient Education method: Explanation, Corporate treasurer cues, and Verbal cues Education comprehension: verbalized understanding, returned demonstration, verbal cues required, and tactile cues required    Patent educated about keeping lights on during the night for safety and to prevent future falls. Also educated about collection of HEP, and to not do all exercises but rather pick a few to do in order to avoid fatigue.  HOME EXERCISE PROGRAM: [id number]  ASSESSMENT:  CLINICAL IMPRESSION: Continued working on general strengthening of UE and LE. Does well throughout session, needs breaks due to fatigue. He is feeling better and moving better, got around at the beach well this weekend and out of bed without railings. Will continue working on strength and balance to decrease fall risk and improve endurance.    OBJECTIVE IMPAIRMENTS Abnormal gait, decreased activity tolerance, decreased balance, decreased coordination, decreased endurance, decreased mobility, difficulty walking, decreased strength, improper body mechanics, and postural dysfunction.   ACTIVITY LIMITATIONS carrying, lifting, bending, standing, squatting, stairs, transfers, bed mobility, and locomotion level  PARTICIPATION LIMITATIONS: meal prep, cleaning, laundry, driving, shopping, community activity, occupation, and yard work  PERSONAL FACTORS Past/current experiences are also affecting patient's functional outcome.   REHAB POTENTIAL: Good  CLINICAL DECISION MAKING: Evolving/moderate complexity  EVALUATION COMPLEXITY: Moderate   GOALS: Goals reviewed with patient? Yes  SHORT TERM GOALS: Target date: 03/17/2022   I with basic HEP Baseline: Goal status: MET    LONG TERM GOALS: Target date: 06/12/2022   I with final  HEP Baseline:  Goal status: INITIAL  2.  Patient will score at least 50/56 on BERG Balance Exam Baseline: 43, 03/31/22 (44), 43/56-8/15 Goal status: IN PROGRESS  3.  Paitent will complete 5x sit to stand in < 12 sec to demonstrate improved strength in BLE. Baseline:  Goal status: IN PROGRESS  4.  Patient will ambulate at least 1000' with LRAD, MI, on level and unlevel surfaces Baseline: 03/31/22 able to walk 1000' w/o AD on even surfaces, some fatigue towards end, SBA Goal status: MET  5.  Patient will be able to manage cooking and cleaning activities in his home without falling and without requiring a rest  break. Baseline:  Goal status: MET   PLAN: PT FREQUENCY: 2x/week  PT DURATION: 12 weeks  PLANNED INTERVENTIONS: Therapeutic exercises, Therapeutic activity, Neuromuscular re-education, Balance training, Gait training, Patient/Family education, Joint mobilization, Stair training, and Manual therapy  PLAN FOR NEXT SESSION: gait and balance activities, core strengthening   Andris Baumann, DPT 05/09/2022, 3:40 PM

## 2022-05-10 ENCOUNTER — Ambulatory Visit: Payer: Medicare Other

## 2022-05-10 DIAGNOSIS — R2689 Other abnormalities of gait and mobility: Secondary | ICD-10-CM

## 2022-05-10 DIAGNOSIS — R278 Other lack of coordination: Secondary | ICD-10-CM | POA: Diagnosis not present

## 2022-05-10 DIAGNOSIS — M25512 Pain in left shoulder: Secondary | ICD-10-CM | POA: Diagnosis not present

## 2022-05-10 DIAGNOSIS — M6281 Muscle weakness (generalized): Secondary | ICD-10-CM | POA: Diagnosis not present

## 2022-05-10 DIAGNOSIS — M25511 Pain in right shoulder: Secondary | ICD-10-CM | POA: Diagnosis not present

## 2022-05-10 DIAGNOSIS — R293 Abnormal posture: Secondary | ICD-10-CM

## 2022-05-10 DIAGNOSIS — M3320 Polymyositis, organ involvement unspecified: Secondary | ICD-10-CM

## 2022-05-10 DIAGNOSIS — R2681 Unsteadiness on feet: Secondary | ICD-10-CM

## 2022-05-10 DIAGNOSIS — R262 Difficulty in walking, not elsewhere classified: Secondary | ICD-10-CM | POA: Diagnosis not present

## 2022-05-10 DIAGNOSIS — R296 Repeated falls: Secondary | ICD-10-CM

## 2022-05-11 NOTE — Therapy (Signed)
OUTPATIENT PHYSICAL THERAPY LOWER EXTREMITY TREATMENT   Patient Name: [name] MRN: [id number] DOB:Feb 03, 1956, 66 y.o., male Today's Date: 05/11/2022            Past Medical History:  Diagnosis Date   Aortic stenosis    mild AS 02/04/22   Arthritis    Diabetes mellitus    fasting 120s   History of kidney stones    Hypertension    Myocardial infarction ([place])    stent placed   Peripheral neuropathy    left fifth finger, left toe   Pneumonia    hx of   Past Surgical History:  Procedure Laterality Date   APPENDECTOMY     CORONARY STENT INTERVENTION N/A 12/28/2021   Procedure: CORONARY STENT INTERVENTION;  Surgeon: Jettie Booze, MD;  Location: [facility];  Service: Cardiovascular;  Laterality: N/A;   CYSTOSCOPY W/ URETERAL STENT PLACEMENT Bilateral 03/17/2014   Procedure: CYSTOSCOPY WITH RIGHT STENT PULL  ;  Surgeon: Alexis Frock, MD;  Location: [facility];  Service: Urology;  Laterality: Bilateral;   CYSTOSCOPY WITH RETROGRADE PYELOGRAM, URETEROSCOPY AND STENT PLACEMENT Bilateral 02/01/2014   Procedure: CYSTOSCOPY WITH BILATERAL RETROGRADE CYSTOSCOPY, BILATERAL RETROGRADE, PYELOGRAM, RIGHT URETEROSCOPY, STONE BASKETRY,  AND BILATERAL STENT PLACEMENT;  Surgeon: Alexis Frock, MD;  [facility];  Service: Urology;  Laterality: Bilateral;   CYSTOSCOPY WITH URETEROSCOPY AND STENT PLACEMENT Left 02/12/2021   Procedure: CYSTOSCOPY WITH LEFT URETEROSCOPY, BALLOON DILITATION, HOLMIUM LASER LITHOTRIPSY AND STENT PLACEMENT;  Surgeon: Lucas Mallow, MD;  [facility];  Service: Urology;  Laterality: Left;   HOLMIUM LASER APPLICATION Left 7/67/3419   Procedure: HOLMIUM LASER APPLICATION;  Surgeon: Alexis Frock, MD;  [facility];  Service: Urology;  Laterality: Left;   INTRAVASCULAR ULTRASOUND/IVUS N/A 12/28/2021   Procedure: Intravascular Ultrasound/IVUS;  Surgeon: Jettie Booze, MD;  Location: [facility];  Service: Cardiovascular;   Laterality: N/A;   LEFT HEART CATH AND CORONARY ANGIOGRAPHY N/A 12/28/2021   Procedure: LEFT HEART CATH AND CORONARY ANGIOGRAPHY;  Surgeon: Jettie Booze, MD;  Location: [facility];  Service: Cardiovascular;  Laterality: N/A;   LITHOTRIPSY     MUSCLE BIOPSY Left 04/19/2022   Procedure: LEFT THIGH MUSCLE BIOPSY;  Surgeon: Erroll Luna, MD;  Location: [facility];  Service: General;  Laterality: Left;   NEPHROLITHOTOMY Left 03/17/2014   Procedure: NEPHROLITHOTOMY PERCUTANEOUS with access;  Surgeon: Alexis Frock, MD;  [facility];  Service: Urology;  Laterality: Left;   NEPHROLITHOTOMY Left 03/19/2014   Procedure: 2ND STAGE NEPHROLITHOTOMY PERCUTANEOUS left ureteroscopy with lasr stone treatment and stent placement, with access;  Surgeon: Alexis Frock, MD;  [facility];  Service: Urology;  Laterality: Left;   TOTAL KNEE ARTHROPLASTY Right 09/02/2014   Procedure: RIGHT TOTAL KNEE ARTHROPLASTY;  Surgeon: Meredith Pel, MD;  Location: [facility];  Service: Orthopedics;  Laterality: Right;   uretral stent     Patient Active Problem List   Diagnosis Date Noted   Diabetic peripheral neuropathy associated with type 2 diabetes mellitus ([place]) 03/23/2022   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   Gait abnormality 03/07/2022   Cerebrovascular accident (CVA) ([place]) 03/07/2022   Myalgia due to statin 02/28/2022   Mild aortic stenosis 02/28/2022   Polymyositis ([place]) 02/04/2022   Elevated CK 02/02/2022   Elevated LFTs 02/02/2022   CAD (coronary artery disease) 02/02/2022   Muscle weakness of all 4 extremities 02/02/2022   Hyperlipidemia 01/28/2022   Arthritis of knee 09/02/2014   Staghorn kidney stones  03/17/2014   Nephrolithiasis 02/01/2014   Pneumonia 08/27/2011   Diabetes mellitus with coincident hypertension ([place]) 08/27/2011    PCP: Greig Right, MD  REFERRING PROVIDER: Cathlyn Parsons, PA-C   REFERRING DIAG: M33.20 (ICD-10-CM) - Polymyositis, organ involvement unspecified    THERAPY DIAG:  No diagnosis found.  Rationale for Evaluation and Treatment Rehabilitation  ONSET DATE: 02/15/2022   SUBJECTIVE:   SUBJECTIVE STATEMENT: Doing pretty good, went to a concert at the beach this weekend.   PERTINENT HISTORY: [name] is a 66 y.o. right-handed male with history of type 2 diabetes mellitus hypertension intolerance of statin due to abnormal LFTs (off for the past years), CAD status post DES to RCA 12/28/2021 (addition of arterial for statin 20 mg, aspirin/Brilinta),   PAIN:  Are you having pain? No  PRECAUTIONS: None  WEIGHT BEARING RESTRICTIONS No  FALLS:  Has patient fallen in last 6 months? Yes. Number of falls 5  LIVING ENVIRONMENT: Lives with: lives with their family and lives alone Lives in: House/apartment Stairs: No Has following equipment at home: Gilford Rile - 4 wheeled, shower chair, Grab bars, and None  OCCUPATION: Comptroller, office work, Government social research officer.  PLOF: Independent  PATIENT GOALS Increase strength, return to his active lifestyle. 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 Travels to attend concerts.   OBJECTIVE:   MUSCLE LENGTH: Hamstrings: Right 62 deg; Left 68 deg   POSTURE: rounded shoulders   LOWER EXTREMITY ROM: WFL throughout, R hip with mild     tightness.   LOWER EXTREMITY MMT:  MMT Right eval Left eval Right 03/31/22 Left 03/31/22  Hip flexion 4- 3+ 4- 4-  Hip extension 4- 3+ 4- 3+  Hip abduction 4- 3+ 4- 3+  Hip adduction      Hip internal rotation      Hip external rotation      Knee flexion 4 4- 4 4  Knee extension 4 4- 4 4  Ankle dorsiflexion 4 4    Ankle plantarflexion      Ankle inversion      Ankle eversion       (Blank rows = not tested)   FUNCTIONAL TESTS:    5x STS18.22 BERG 43/56   TODAY'S TREATMENT: 05/12/22 Bike or Nustep Step ups 6" Leg press  Balance beam  Standing on airex hitting ball Marching with alt red ball  hold   05/10/22 Nustep L5 x28mins  Walk outside 1 big loop Resisted gait 20# 4x4 way Pallof press 10# 2x10 Leg ext 25# 3x10 HS curls 25# 3x10 Seated rows 25# 2x10 Lat pull downs 20# 2x10    05/04/22 Bike L5 x36mins  Lateral band walks greenTB  Step ups 6" 2x10 bilat Standing marches 5# 2x10  Step over half foam 5# 2x10 Walking on beam forwards and side steps   05/03/22 Bike L4 x17mins  Seated rows 20# 2x10  Lat pull down 15# 2x10  Leg press 50# 2x10, x10 RLE eccentric  Calf raises on black bar 2x12  Shoulder ER 5# 2x10 2 way hip 5# abd/ext    04/27/22 STS with OHP red ball 2x10 Crunches on slant 2x10 Standing twists with greenTB 2x10  Knee flex 25# 2x15 Knee ext 20# x15, 25# x15 Shoulder ext 5# 2x10 Resisted gait 20# 4 way x4  04/14/22 Nustep L5 x54mins Knee ext 20# 3x10  HS curls 15# 3x10 Pallof press 5# 2x10  Calf raises on airex 2x12 BlackTB crunches 2x12  Leg press 40# 3x10  04/07/22 Bike L5 x48mins Leg press 40# 2x10  Resisted gait 15# 5x forwards and sidesteps Hip ext 5# 2x10 each side  Crunches w/blackTB 2x15 Back ext black TB 2x15 Gastroc stretch on slant 45min   04/05/22 Nustep L5 x62mins Bike L4 x5 mins  Walking up and down incline outside x2 Calf raises 2x10 Knee ext 20# 3x10 Knee flex 15# 3x10 Step ups 6" 2x10   03/30/22 Nustep L5 x45mins 5xSTS (unable to complete 5, stopped at 4 in 20s)  BERG (44/56) Walking 10 laps w/o cane  Cybex flex 15# 2x15 Cybex ext 20# 2x12  03/22/22 Nustep L4 x32mins STS on Airex 2x10 Walking outside on grass Walking down and up hill  Cybex knee flex 25# 2x10 Cybex knee ext 25# 2x10  03/17/22 Bike L4, 39mins Resisted gait 15# forwards and sideways x5 Hip abd and ext 3# 2x10 Side steps on foam Step overs 4" Walking without cane 3 laps   03/15/22 Nustep L5, 27mins UBE forwards/back 6 mins STS elevated table holding red ball 2x10 Crunches with black band 3x10 Step ups forwards and sideways 20reps  4" Standing on foam feet together catch Tandem on foam 30s    03/10/22 Nustep L5, 35mins  Shoulder flexion w/dowel 2x10  Bicep curls 4# 2x10 Walking without cane 3 laps CGA Step ups on foam CGA, 2 LOB  Standing on foam playing catch minA  2 way hip ext/abd 2x10       PATIENT EDUCATION:  Education details: POC Person educated: Patient Education method: Explanation, Corporate treasurer cues, and Verbal cues Education comprehension: verbalized understanding, returned demonstration, verbal cues required, and tactile cues required    Patent educated about keeping lights on during the night for safety and to prevent future falls. Also educated about collection of HEP, and to not do all exercises but rather pick a few to do in order to avoid fatigue.  HOME EXERCISE PROGRAM: [id number]  ASSESSMENT:  CLINICAL IMPRESSION: Continued working on general strengthening of UE and LE. Does well throughout session, needs breaks due to fatigue. He is feeling better and moving better, got around at the beach well this weekend and out of bed without railings. Will continue working on strength and balance to decrease fall risk and improve endurance.    OBJECTIVE IMPAIRMENTS Abnormal gait, decreased activity tolerance, decreased balance, decreased coordination, decreased endurance, decreased mobility, difficulty walking, decreased strength, improper body mechanics, and postural dysfunction.   ACTIVITY LIMITATIONS carrying, lifting, bending, standing, squatting, stairs, transfers, bed mobility, and locomotion level  PARTICIPATION LIMITATIONS: meal prep, cleaning, laundry, driving, shopping, community activity, occupation, and yard work  PERSONAL FACTORS Past/current experiences are also affecting patient's functional outcome.   REHAB POTENTIAL: Good  CLINICAL DECISION MAKING: Evolving/moderate complexity  EVALUATION COMPLEXITY: Moderate   GOALS: Goals reviewed with patient? Yes  SHORT TERM GOALS: Target  date: 03/17/2022   I with basic HEP Baseline: Goal status: MET    LONG TERM GOALS: Target date: 06/12/2022   I with final HEP Baseline:  Goal status: INITIAL  2.  Patient will score at least 50/56 on BERG Balance Exam Baseline: 43, 03/31/22 (44), 43/56-8/15 Goal status: IN PROGRESS  3.  Paitent will complete 5x sit to stand in < 12 sec to demonstrate improved strength in BLE. Baseline:  Goal status: IN PROGRESS  4.  Patient will ambulate at least 1000' with LRAD, MI, on level and unlevel surfaces Baseline: 03/31/22 able to walk 1000' w/o AD on even surfaces, some fatigue towards end, SBA Goal status:  MET  5.  Patient will be able to manage cooking and cleaning activities in his home without falling and without requiring a rest break. Baseline:  Goal status: MET   PLAN: PT FREQUENCY: 2x/week  PT DURATION: 12 weeks  PLANNED INTERVENTIONS: Therapeutic exercises, Therapeutic activity, Neuromuscular re-education, Balance training, Gait training, Patient/Family education, Joint mobilization, Stair training, and Manual therapy  PLAN FOR NEXT SESSION: gait and balance activities, core strengthening   Andris Baumann, DPT 05/11/2022, 4:34 PM

## 2022-05-12 ENCOUNTER — Encounter: Payer: Self-pay | Admitting: Occupational Therapy

## 2022-05-12 ENCOUNTER — Ambulatory Visit: Payer: Medicare Other

## 2022-05-12 ENCOUNTER — Ambulatory Visit: Payer: Medicare Other | Admitting: Occupational Therapy

## 2022-05-12 DIAGNOSIS — M3320 Polymyositis, organ involvement unspecified: Secondary | ICD-10-CM | POA: Diagnosis not present

## 2022-05-12 DIAGNOSIS — R2689 Other abnormalities of gait and mobility: Secondary | ICD-10-CM

## 2022-05-12 DIAGNOSIS — M25511 Pain in right shoulder: Secondary | ICD-10-CM | POA: Diagnosis not present

## 2022-05-12 DIAGNOSIS — M6281 Muscle weakness (generalized): Secondary | ICD-10-CM

## 2022-05-12 DIAGNOSIS — R262 Difficulty in walking, not elsewhere classified: Secondary | ICD-10-CM | POA: Diagnosis not present

## 2022-05-12 DIAGNOSIS — R296 Repeated falls: Secondary | ICD-10-CM

## 2022-05-12 DIAGNOSIS — M25512 Pain in left shoulder: Secondary | ICD-10-CM | POA: Diagnosis not present

## 2022-05-12 DIAGNOSIS — R278 Other lack of coordination: Secondary | ICD-10-CM

## 2022-05-12 DIAGNOSIS — R2681 Unsteadiness on feet: Secondary | ICD-10-CM | POA: Diagnosis not present

## 2022-05-12 DIAGNOSIS — R293 Abnormal posture: Secondary | ICD-10-CM

## 2022-05-12 NOTE — Therapy (Signed)
OUTPATIENT OCCUPATIONAL THERAPY TREATMENT NOTE & RE-CERTIFICATION   Patient Name: [name] MRN: [id number] DOB:08-07-1956, 66 y.o., male Today's Date: 05/12/2022  PCP: Alinda Deem, MD REFERRING PROVIDER: Charlton Amor, PA-C   END OF SESSION:   OT End of Session - 05/12/22 1016     Visit Number 18    Number of Visits 26    Date for OT Re-Evaluation 06/11/22    Authorization Type BCBS Medicare    Authorization Time Period VL: [place]    OT Start Time 1015    OT Stop Time 1056    OT Time Calculation (min) 41 min    Activity Tolerance Patient tolerated treatment well    Behavior During Therapy WFL for tasks assessed/performed            Past Medical History:  Diagnosis Date   Aortic stenosis    mild AS 02/04/22   Arthritis    Diabetes mellitus    fasting 120s   History of kidney stones    Hypertension    Myocardial infarction ([facility])    stent placed   Peripheral neuropathy    left fifth finger, left toe   Pneumonia    hx of   Past Surgical History:  Procedure Laterality Date   APPENDECTOMY     CORONARY STENT INTERVENTION N/A 12/28/2021   Procedure: CORONARY STENT INTERVENTION;  Surgeon: Corky Crafts, MD;  [facility]: [facility];  Service: Cardiovascular;  Laterality: N/A;   CYSTOSCOPY W/ URETERAL STENT PLACEMENT Bilateral 03/17/2014   Procedure: CYSTOSCOPY WITH RIGHT STENT PULL  ;  Surgeon: Sebastian Ache, MD;  [facility];  Service: Urology;  Laterality: Bilateral;   CYSTOSCOPY WITH RETROGRADE PYELOGRAM, URETEROSCOPY AND STENT PLACEMENT Bilateral 02/01/2014   Procedure: CYSTOSCOPY WITH BILATERAL RETROGRADE CYSTOSCOPY, BILATERAL RETROGRADE, PYELOGRAM, RIGHT URETEROSCOPY, STONE BASKETRY,  AND BILATERAL STENT PLACEMENT;  Surgeon: Sebastian Ache, MD;  [facility];  Service: Urology;  Laterality: Bilateral;   CYSTOSCOPY WITH URETEROSCOPY AND STENT PLACEMENT Left 02/12/2021   Procedure: CYSTOSCOPY WITH LEFT URETEROSCOPY, BALLOON DILITATION,  HOLMIUM LASER LITHOTRIPSY AND STENT PLACEMENT;  Surgeon: Crista Elliot, MD;  [facility];  Service: Urology;  Laterality: Left;   HOLMIUM LASER APPLICATION Left 03/19/2014   Procedure: HOLMIUM LASER APPLICATION;  Surgeon: Sebastian Ache, MD;  [facility];  Service: Urology;  Laterality: Left;   INTRAVASCULAR ULTRASOUND/IVUS N/A 12/28/2021   Procedure: Intravascular Ultrasound/IVUS;  Surgeon: Corky Crafts, MD;  [facility];  Service: Cardiovascular;  Laterality: N/A;   LEFT HEART CATH AND CORONARY ANGIOGRAPHY N/A 12/28/2021   Procedure: LEFT HEART CATH AND CORONARY ANGIOGRAPHY;  Surgeon: Corky Crafts, MD;  Location: [facility];  Service: Cardiovascular;  Laterality: N/A;   LITHOTRIPSY     MUSCLE BIOPSY Left 04/19/2022   Procedure: LEFT THIGH MUSCLE BIOPSY;  Surgeon: Harriette Bouillon, MD;  Location[facility];  Service: General;  Laterality: Left;   NEPHROLITHOTOMY Left 03/17/2014   Procedure: NEPHROLITHOTOMY PERCUTANEOUS with access;  Surgeon: Sebastian Ache, MD;  [facility];  Service: Urology;  Laterality: Left;   NEPHROLITHOTOMY Left 03/19/2014   Procedure: 2ND STAGE NEPHROLITHOTOMY PERCUTANEOUS left ureteroscopy with lasr stone treatment and stent placement, with access;  Surgeon: Sebastian Ache, MD;  [facility];  Service: Urology;  Laterality: Left;   TOTAL KNEE ARTHROPLASTY Right 09/02/2014   Procedure: RIGHT TOTAL KNEE ARTHROPLASTY;  Surgeon: Cammy Copa, MD;  [facility]: [facility];  Service: Orthopedics;  Laterality: Right;  uretral stent     Patient Active Problem List   Diagnosis Date Noted   Diabetic peripheral neuropathy associated with type 2 diabetes mellitus ([place]) 03/23/2022   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   Gait abnormality 03/07/2022   Cerebrovascular accident (CVA) ([place]) 03/07/2022   Myalgia due to statin 02/28/2022   Mild aortic stenosis 02/28/2022   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   Elevated CK 02/02/2022    Elevated LFTs 02/02/2022   CAD (coronary artery disease) 02/02/2022   Muscle weakness of all 4 extremities 02/02/2022   Hyperlipidemia 01/28/2022   Arthritis of knee 09/02/2014   Staghorn kidney stones 03/17/2014   Nephrolithiasis 02/01/2014   Pneumonia 08/27/2011   Diabetes mellitus with coincident hypertension ([place]) 08/27/2011   ONSET DATE: 12/27/21  REFERRING DIAG: M33.20 (ICD-10-CM) - Polymyositis, organ involvement unspecified   THERAPY DIAG:  Muscle weakness (generalized)  Right shoulder pain, unspecified chronicity  Left shoulder pain, unspecified chronicity  Other abnormalities of gait and mobility  Rationale for Evaluation and Treatment Rehabilitation  PERTINENT HISTORY: Suspected polymyositis; non-ST-elevation myocardial infarction (NSTEMI) s/p percutaneous coronary intervention (PCI) and drug-eluting stent (DES) placement 12/28/21; incidental small silent brainstem stroke (R pons infarct) identified per MRI; cervical spine neuroforaminal stenosis  R shoulder partial to near full-thickness supraspinatus tear; bilateral shoulder pain w/ R > L  Additional PMH: CAD, DMT2, HTN, dyslipidemia, nephrolithiasis, recent transaminitis, h/o stage I prostate cancer, and R TKA in 2016  PRECAUTIONS: Fall; gradual return to driving  SUBJECTIVE:  SUBJECTIVE STATEMENT: "I like the setup of everything as it is right now"  PAIN: Are you having pain? 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, chartered at the [employer]; travels for bands/festivals; yardwork  OBJECTIVE:   HAND DOMINANCE: Left  TODAY'S TREATMENT - 05/12/22:  Overhead Reach Lifting 5 lb dumbbell to tap at [employer] w/ BUEs; tapping each shelf 5x and returning to center each time to simulate functional overhead lifting of medium-weight objects  UBE  Reciprocal arm bike for conditioning and BUE strengthening on Level 4.0; 7 minutes total (3.5 forward, 3.5 back)  Core Strengthening Trunk extension holding black theraband w/ both hands while seated; 2x15  Trunk flexion holding black theraband w/ both hands while seated; 2x15  [employer] and catching 4.4 lb med ball (yellow) against the wall while standing on foam balance pad; completed 2x10 w/ BUE both underhand and overhand w/ good technique at CGA     *Rest breaks taken between all exercises and between sets prn due to activity tolerance   PATIENT EDUCATION: Ongoing condition-specific education related to therapeutic interventions completed this session Person educated: Patient Education method: Explanation Education comprehension: verbalized understanding  ------------------------------------------------------------------------------------------------------------------------------------------------------  UPPER EXTREMITY ROM     Active ROM Right Eval - 5/25  6/27  7/6  8/3  8/17  Shoulder flexion 51 48 52 73 128  Shoulder abduction 50 68 63 81 92  Shoulder extension 40      Shoulder IR 80      Shoulder ER 40    73  Elbow flexion 135      Elbow extension 41 3     (Blank rows = not tested)  Active ROM Left Eval - 5/25  6/27  7/6  8/3  8/17  Shoulder flexion 55 79 96 147 161  Shoulder abduction 58 87     Shoulder extension 47  Shoulder IR 84      Shoulder ER 35      Elbow flexion 138      Elbow extension 6 4     (Blank rows = not tested)  UPPER EXTREMITY MMT:     MMT Right 8/17 Left 8/17  Shoulder flexion 3/5 3+/5  Shoulder abduction 3/5 3+/5  Shoulder adduction 4-/5 4-/5  Shoulder extension 4-/5 4-/5  Shoulder internal rotation 4/5 4/5  Shoulder external rotation 4-/5 4-/4  Elbow flexion 4/5 4/5  Elbow extension 4/5 4/5  (Blank rows = not  tested)  ------------------------------------------------------------------------------------------------------------------------------------------------------  HOME EXERCISE PROGRAM: MedBridge Code: [id number] - Supine Shoulder Flexion AAROM with Hands Clasped  - 2 x daily - 1 sets - 10 reps - 5 sec hold - Shoulder Flexion Wall Slide with Towel  - 2 x daily - 7 x weekly - 2 sets - 5 reps - Seated Shoulder Flexion with Dowel to 90  - 2 x daily - 2-3 sets - 10-15 reps - Seated Shoulder Row with Anchored Resistance  - 2 x daily - 2-3 sets - 10-15 reps - Seated Shoulder External Rotation with Dumbbells  - 2 x daily - 2-3 sets - 10-15 reps - Seated Upper Trapezius Stretch  - 2 x daily - 7 x weekly - 1 sets - 25 reps - Gentle Levator Scapulae Stretch  - 2 x daily - 7 x weekly - 1 sets - 25 reps  GOALS: Goals reviewed with patient? Yes  SHORT TERM GOALS: Target date: 03/31/22  STG  Status:  1 Pt will verbalize understanding of at least 3 fall prevention and/or energy conservation strategies to increase safety w/ functional mobility at home Baseline: Decreased knowledge of strategies Met - 02/24/22  2 Pt will demonstrate independence w/ recommended DME and/or AE (e.g., long-handle sponge, dressing stick, etc.) to improve participation and safety w/ ADLs Baseline: Decreased knowledge of AE Met - 03/31/22  3 Pt will improve functional use of BUEs as evidence by being able to achieve at least 70 degrees of shoulder flexion w/out pain Baseline: RUE: 51 deg, LUE: 55 deg Met - 03/31/22:  RUE: 79 deg,  LUE: 96 deg  4 Pt will improve functional bilateral reach by increasing R elbow extension to at least 25 degrees Baseline: 41* of flexion when attempting extension Met - 03/22/22; WFL    LONG TERM GOALS: Target date: 06/11/22  LTG  Status:  1 Pt will demonstrate independence w/ full HEP designed for ROM and strength of BUEs Baseline: No HEP at this time Met - 04/28/22  2 Pt will improve QuickDASH score by at  least 19 points (40) to indicate mild functional impairment Baseline: 59.1 (moderate, borderline severe) impairment) Met - 04/07/22: 22.7  3 Pt will improve functional use of BUEs during grooming tasks (e.g., shampooing hair) as evidenced by improving strength of BUE shoulder flexion to at least 3+/5  Baseline: poor Progressing - 05/12/22: RUE 3/5, LUE 3+/5  4 With both hands, pt will be able to grip w/ at least 20 lbs of force to improve functional use of BUEs during IADLs Baseline: Unable to assess at eval due to medical restrictions Met - 04/07/22: RUE 56 lbs, LUE 73 lbs  5 Pt will be able to retrieve lightweight object (< 5 lbs) from overhead height w/out pain by d/c Baseline: Unable to reach overhead Met - 04/07/22  6 Pt will be able to retrieve medium-weight object (~5 lbs) from both low and overhead heights by d/c  Baseline: Difficulty retrieving cast iron skillet from oven Progressing  7 Pt will report being able to consistently complete bed mobility within acceptable amount of time w/out UE support Baseline: currently taking about 2 minutes; reports increased difficulty rolling/pushing up toward R side > L side New    ASSESSMENT:  CLINICAL IMPRESSION: Treatment today continued focus on BUE strengthening and core stability for improved safety and efficiency w/ higher level IADL and leisure tasks. OT reassessed progress toward functional goals this session w/ pt indicating continued difficulty w/ low and high level reaching for medium-heavy weight objects as well a functional transitions and mobility (e.g., bed mobility). Pt demonstrates improved AROM, now [facility] bilaterally, as well as improved strength, increasing proximal UE strength from poor to fair. Overall, pt continues to make good progress toward goals and demonstrates improvements each session. Continued skilled OT services, 1x/week in clinic and 1x/week in the pool, will be beneficial to address remaining functional goals and maximize  functional use of BUEs in working toward returning to PLOF. Pt agrees to POC at this time.  PERFORMANCE DEFICITS in functional skills including ADLs, IADLs, ROM, strength, pain, GMC, mobility, balance, body mechanics, endurance, cardiopulmonary status limiting function, decreased knowledge of precautions, decreased knowledge of use of DME, and UE functional use.   IMPAIRMENTS are limiting patient from ADLs, IADLs, rest and sleep, work, leisure, and social participation.   COMORBIDITIES has co-morbidities such as NSTEMI, CAD, and DM  that affects occupational performance. Patient will benefit from skilled OT to address above impairments and improve overall function.   PLAN: OT FREQUENCY: 2x/week  OT DURATION: 10 weeks  PLANNED INTERVENTIONS: self care/ADL training, therapeutic exercise, therapeutic activity, manual therapy, passive range of motion, functional mobility training, aquatic therapy, moist heat, cryotherapy, patient/family education, energy conservation, DME and/or AE instructions, and Re-evaluation  RECOMMENDED OTHER SERVICES: Currently receiving PT services at this location  CONSULTED AND AGREED WITH PLAN OF CARE: Patient  PLAN FOR NEXT SESSION: Continue to focus on core and BUE strength (R more impacted than L)   Kathrine Cords, MSOT, OTR/L 05/12/2022, 11:01 AM

## 2022-05-16 ENCOUNTER — Ambulatory Visit: Payer: Medicare Other | Admitting: Occupational Therapy

## 2022-05-17 ENCOUNTER — Ambulatory Visit: Payer: Medicare Other

## 2022-05-17 DIAGNOSIS — M3320 Polymyositis, organ involvement unspecified: Secondary | ICD-10-CM

## 2022-05-17 DIAGNOSIS — R293 Abnormal posture: Secondary | ICD-10-CM

## 2022-05-17 DIAGNOSIS — R2689 Other abnormalities of gait and mobility: Secondary | ICD-10-CM

## 2022-05-17 DIAGNOSIS — R2681 Unsteadiness on feet: Secondary | ICD-10-CM

## 2022-05-17 DIAGNOSIS — M25512 Pain in left shoulder: Secondary | ICD-10-CM | POA: Diagnosis not present

## 2022-05-17 DIAGNOSIS — M25511 Pain in right shoulder: Secondary | ICD-10-CM | POA: Diagnosis not present

## 2022-05-17 DIAGNOSIS — R278 Other lack of coordination: Secondary | ICD-10-CM

## 2022-05-17 DIAGNOSIS — R296 Repeated falls: Secondary | ICD-10-CM | POA: Diagnosis not present

## 2022-05-17 DIAGNOSIS — R262 Difficulty in walking, not elsewhere classified: Secondary | ICD-10-CM | POA: Diagnosis not present

## 2022-05-17 DIAGNOSIS — M6281 Muscle weakness (generalized): Secondary | ICD-10-CM | POA: Diagnosis not present

## 2022-05-17 NOTE — Therapy (Signed)
OUTPATIENT PHYSICAL THERAPY LOWER EXTREMITY TREATMENT   Patient Name: [name] MRN: [id number] DOB:30-Nov-1955, 66 y.o., male Today's Date: 05/17/2022   PT End of Session - 05/17/22 0922     Visit Number 19    Date for PT Re-Evaluation 06/13/22    PT Start Time 0920    PT Stop Time 1005    PT Time Calculation (min) 45 min    Activity Tolerance Patient limited by fatigue;Patient tolerated treatment well    Behavior During Therapy [facility] for tasks assessed/performed                     Past Medical History:  Diagnosis Date   Aortic stenosis    mild AS 02/04/22   Arthritis    Diabetes mellitus    fasting 120s   History of kidney stones    Hypertension    Myocardial infarction ([place])    stent placed   Peripheral neuropathy    left fifth finger, left toe   Pneumonia    hx of   Past Surgical History:  Procedure Laterality Date   APPENDECTOMY     CORONARY STENT INTERVENTION N/A 12/28/2021   Procedure: CORONARY STENT INTERVENTION;  Surgeon: Jettie Booze, MD;  Location: [place] [facility];  Service: Cardiovascular;  Laterality: N/A;   CYSTOSCOPY W/ URETERAL STENT PLACEMENT Bilateral 03/17/2014   Procedure: CYSTOSCOPY WITH RIGHT STENT PULL  ;  Surgeon: Alexis Frock, MD;  Location: [facility];  Service: Urology;  Laterality: Bilateral;   CYSTOSCOPY WITH RETROGRADE PYELOGRAM, URETEROSCOPY AND STENT PLACEMENT Bilateral 02/01/2014   Procedure: CYSTOSCOPY WITH BILATERAL RETROGRADE CYSTOSCOPY, BILATERAL RETROGRADE, PYELOGRAM, RIGHT URETEROSCOPY, STONE BASKETRY,  AND BILATERAL STENT PLACEMENT;  Surgeon: Alexis Frock, MD;  Location: [facility];  Service: Urology;  Laterality: Bilateral;   CYSTOSCOPY WITH URETEROSCOPY AND STENT PLACEMENT Left 02/12/2021   Procedure: CYSTOSCOPY WITH LEFT URETEROSCOPY, BALLOON DILITATION, HOLMIUM LASER LITHOTRIPSY AND STENT PLACEMENT;  Surgeon: Lucas Mallow, MD;  Location: [facility];  Service: Urology;  Laterality: Left;   HOLMIUM LASER  APPLICATION Left 1/91/4782   Procedure: HOLMIUM LASER APPLICATION;  Surgeon: Alexis Frock, MD;  Location: [facility];  Service: Urology;  Laterality: Left;   INTRAVASCULAR ULTRASOUND/IVUS N/A 12/28/2021   Procedure: Intravascular Ultrasound/IVUS;  Surgeon: Jettie Booze, MD;  Location: [place] [facility];  Service: Cardiovascular;  Laterality: N/A;   LEFT HEART CATH AND CORONARY ANGIOGRAPHY N/A 12/28/2021   Procedure: LEFT HEART CATH AND CORONARY ANGIOGRAPHY;  Surgeon: Jettie Booze, MD;  Location: [place] [facility];  Service: Cardiovascular;  Laterality: N/A;   LITHOTRIPSY     MUSCLE BIOPSY Left 04/19/2022   Procedure: LEFT THIGH MUSCLE BIOPSY;  Surgeon: Erroll Luna, MD;  Location: [place];  Service: General;  Laterality: Left;   NEPHROLITHOTOMY Left 03/17/2014   Procedure: NEPHROLITHOTOMY PERCUTANEOUS with access;  Surgeon: Alexis Frock, MD;  Location: [facility];  Service: Urology;  Laterality: Left;   NEPHROLITHOTOMY Left 03/19/2014   Procedure: 2ND STAGE NEPHROLITHOTOMY PERCUTANEOUS left ureteroscopy with lasr stone treatment and stent placement, with access;  Surgeon: Alexis Frock, MD;  Location: [facility];  Service: Urology;  Laterality: Left;   TOTAL KNEE ARTHROPLASTY Right 09/02/2014   Procedure: RIGHT TOTAL KNEE ARTHROPLASTY;  Surgeon: Meredith Pel, MD;  Location: [place];  Service: Orthopedics;  Laterality: Right;   uretral stent     Patient Active Problem List   Diagnosis Date Noted   Diabetic peripheral neuropathy associated with type 2 diabetes mellitus (  [place]) 03/23/2022   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   Gait abnormality 03/07/2022   Cerebrovascular accident (CVA) ([place]) 03/07/2022   Myalgia due to statin 02/28/2022   Mild aortic stenosis 02/28/2022   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   Elevated CK 02/02/2022   Elevated LFTs 02/02/2022   CAD (coronary artery disease) 02/02/2022   Muscle weakness of all 4 extremities 02/02/2022   Hyperlipidemia 01/28/2022   Arthritis  of knee 09/02/2014   Stag[place] kidney stones 03/17/2014   Nephrolithiasis 02/01/2014   Pneumonia 08/27/2011   Diabetes mellitus with coincident hypertension ([place]) 08/27/2011    PCP: Greig Right, MD  REFERRING PROVIDER: Cathlyn Parsons, PA-C   REFERRING DIAG: M33.20 (ICD-10-CM) - Polymyositis, organ involvement unspecified   THERAPY DIAG:  Muscle weakness (generalized)  Other abnormalities of gait and mobility  Polymyositis (HCC)  Repeated falls  Other lack of coordination  Unsteadiness on feet  Difficulty in walking, not elsewhere classified  Abnormal posture  Rationale for Evaluation and Treatment Rehabilitation  ONSET DATE: 02/15/2022   SUBJECTIVE:   SUBJECTIVE STATEMENT: Doing pretty good, having some trouble with my sciatic nerve today.   PERTINENT HISTORY: [name] is a 66 y.o. right-handed male with history of type 2 diabetes mellitus hypertension intolerance of statin due to abnormal LFTs (off for the past years), CAD status post DES to RCA 12/28/2021 (addition of arterial for statin 20 mg, aspirin/Brilinta),   PAIN:  Are you having pain? No  PRECAUTIONS: None  WEIGHT BEARING RESTRICTIONS No  FALLS:  Has patient fallen in last 6 months? Yes. Number of falls 5  LIVING ENVIRONMENT: Lives with: lives with their family and lives alone Lives in: House/apartment Stairs: No Has following equipment at home: Gilford Rile - 4 wheeled, shower chair, Grab bars, and None  OCCUPATION: Comptroller, office work, Government social research officer.  PLOF: Independent  PATIENT GOALS Increase strength, return to his active lifestyle. 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 Travels to attend concerts.   OBJECTIVE:   MUSCLE LENGTH: Hamstrings: Right 62 deg; Left 68 deg   POSTURE: rounded shoulders   LOWER EXTREMITY ROM: WFL throughout, R hip with mild     tightness.   LOWER EXTREMITY MMT:  MMT Right eval Left eval Right 03/31/22  Left 03/31/22  Hip flexion 4- 3+ 4- 4-  Hip extension 4- 3+ 4- 3+  Hip abduction 4- 3+ 4- 3+  Hip adduction      Hip internal rotation      Hip external rotation      Knee flexion 4 4- 4 4  Knee extension 4 4- 4 4  Ankle dorsiflexion 4 4    Ankle plantarflexion      Ankle inversion      Ankle eversion       (Blank rows = not tested)   FUNCTIONAL TESTS:    5x STS18.22 BERG 43/56   TODAY'S TREATMENT: 05/17/22 Nustep L5 x30mns  Resisted gait 30# 4 way x 4  Leg ext 25# 3x10 HS curls 35# 3x10  Shoulder ext 5# 2x10 Cable rows 10# 2x10  STS with OHP on airex 2x10 Bicep curls 15# 2x10 Tricep ext 20# 2x10 Calf raises on black bar 2x12    05/12/22 Bike L5x670ms  Step ups 6" forwards and lateral  Leg press 40# x10, 50# x10  5xSTS- 39.85s Walking on balance beam  Standing on airex hitting ball Marching on airex 5# AW Calf raises on airex 5# AW  OHP yellow ball on airex 2x10  05/10/22 Nustep L5 x16mns  Walk outside 1 big loop Resisted gait 20# 4x4 way Pallof press 10# 2x10 Leg ext 25# 3x10 HS curls 25# 3x10 Seated rows 25# 2x10 Lat pull downs 20# 2x10    05/04/22 Bike L5 x652ms  Lateral band walks greenTB  Step ups 6" 2x10 bilat Standing marches 5# 2x10  Step over half foam 5# 2x10 Walking on beam forwards and side steps   05/03/22 Bike L4 x6m24m  Seated rows 20# 2x10  Lat pull down 15# 2x10  Leg press 50# 2x10, x10 RLE eccentric  Calf raises on black bar 2x12  Shoulder ER 5# 2x10 2 way hip 5# abd/ext    04/27/22 STS with OHP red ball 2x10 Crunches on slant 2x10 Standing twists with greenTB 2x10  Knee flex 25# 2x15 Knee ext 20# x15, 25# x15 Shoulder ext 5# 2x10 Resisted gait 20# 4 way x4  04/14/22 Nustep L5 x6mi13mKnee ext 20# 3x10  HS curls 15# 3x10 Pallof press 5# 2x10  Calf raises on airex 2x12 BlackTB crunches 2x12  Leg press 40# 3x10   04/07/22 Bike L5 x6min53meg press 40# 2x10  Resisted gait 15# 5x forwards and sidesteps Hip ext 5# 2x10  each side  Crunches w/blackTB 2x15 Back ext black TB 2x15 Gastroc stretch on slant 1min 35m/11/23 Nustep L5 x5mins 27me L4 x5 mins  Walking up and down incline outside x2 Calf raises 2x10 Knee ext 20# 3x10 Knee flex 15# 3x10 Step ups 6" 2x10   03/30/22 Nustep L5 x6mins 55mS (unable to complete 5, stopped at 4 in 20s)  BERG (44/56) Walking 10 laps w/o cane  Cybex flex 15# 2x15 Cybex ext 20# 2x12  03/22/22 Nustep L4 x6mins ST37mn Airex 2x10 Walking outside on grass Walking down and up hill  Cybex knee flex 25# 2x10 Cybex knee ext 25# 2x10  03/17/22 Bike L4, 6mins Res27med gait 15# forwards and sideways x5 Hip abd and ext 3# 2x10 Side steps on foam Step overs 4" Walking without cane 3 laps   03/15/22 Nustep L5, 6mins UBE 71mwards/back 6 mins STS elevated table holding red ball 2x10 Crunches with black band 3x10 Step ups forwards and sideways 20reps 4" Standing on foam feet together catch Tandem on foam 30s    03/10/22 Nustep L5, 6mins  Shou37mr flexion w/dowel 2x10  Bicep curls 4# 2x10 Walking without cane 3 laps CGA Step ups on foam CGA, 2 LOB  Standing on foam playing catch minA  2 way hip ext/abd 2x10       PATIENT EDUCATION:  Education details: POC Person educated: Patient Education method: Explanation, Tactile cuesCorporate treasurererbal cues Education comprehension: verbalized understanding, returned demonstration, verbal cues required, and tactile cues required    Patent educated about keeping lights on during the night for safety and to prevent future falls. Also educated about collection of HEP, and to not do all exercises but rather pick a few to do in order to avoid fatigue.  HOME EXERCISE PROGRAM: [id number]  [id number]:  CLINICAL IMPRESSION: Continued working on general strengthening of UE and LE. Patient is doing really well overall and is moving faster throughout session showing good progress with strength. He is able to get through more exercises  today without as much fatigue. 20th visit progress note next visit.    OBJECTIVE IMPAIRMENTS Abnormal gait, decreased activity tolerance, decreased balance, decreased coordination, decreased endurance, decreased mobility, difficulty walking, decreased strength, improper body mechanics, and postural dysfunction.   ACTIVITY LIMITATIONS carrying,  lifting, bending, standing, squatting, stairs, transfers, bed mobility, and locomotion level  PARTICIPATION LIMITATIONS: meal prep, cleaning, laundry, driving, shopping, community activity, occupation, and yard work  PERSONAL FACTORS Past/current experiences are also affecting patient's functional outcome.   REHAB POTENTIAL: Good  CLINICAL DECISION MAKING: Evolving/moderate complexity  EVALUATION COMPLEXITY: Moderate   GOALS: Goals reviewed with patient? Yes  SHORT TERM GOALS: Target date: 03/17/2022   I with basic HEP Baseline: Goal status: MET    LONG TERM GOALS: Target date: 06/12/2022   I with final HEP Baseline:  Goal status: INITIAL  2.  Patient will score at least 50/56 on BERG Balance Exam Baseline: 43, 03/31/22 (44), 43/56-8/15 Goal status: IN PROGRESS  3.  Paitent will complete 5x sit to stand in < 12 sec to demonstrate improved strength in BLE. Baseline: 39.85s-8/17 Goal status: IN PROGRESS  4.  Patient will ambulate at least 1000' with LRAD, MI, on level and unlevel surfaces Baseline: 03/31/22 able to walk 1000' w/o AD on even surfaces, some fatigue towards end, SBA Goal status: MET  5.  Patient will be able to manage cooking and cleaning activities in his home without falling and without requiring a rest break. Baseline:  Goal status: MET   PLAN: PT FREQUENCY: 2x/week  PT DURATION: 12 weeks  PLANNED INTERVENTIONS: Therapeutic exercises, Therapeutic activity, Neuromuscular re-education, Balance training, Gait training, Patient/Family education, Joint mobilization, Stair training, and Manual therapy  PLAN FOR NEXT  SESSION: gait and balance activities, core strengthening   Andris Baumann, DPT 05/17/2022, 10:05 AM

## 2022-05-17 NOTE — Therapy (Signed)
OUTPATIENT PHYSICAL THERAPY LOWER EXTREMITY TREATMENT   Patient Name: [name] MRN: [id number] DOB:[birth year], 66 y.o., male Today's Date: 05/19/2022   PT End of Session - 05/19/22 0924     Visit Number 20    Date for PT Re-Evaluation 06/13/22    PT Start Time 0925    PT Stop Time 1010    PT Time Calculation (min) 45 min    Activity Tolerance Patient limited by fatigue;Patient tolerated treatment well    Behavior During Therapy [facility] for tasks assessed/performed                      Past Medical History:  Diagnosis Date   Aortic stenosis    mild AS 02/04/22   Arthritis    Diabetes mellitus    fasting 120s   History of kidney stones    Hypertension    Myocardial infarction ([place])    stent placed   Peripheral neuropathy    left fifth finger, left toe   Pneumonia    hx of   Past Surgical History:  Procedure Laterality Date   APPENDECTOMY     CORONARY STENT INTERVENTION N/A 12/28/2021   Procedure: CORONARY STENT INTERVENTION;  Surgeon: Jettie Booze, MD;  Location: [place] [facility];  Service: Cardiovascular;  Laterality: N/A;   CYSTOSCOPY W/ URETERAL STENT PLACEMENT Bilateral 03/17/2014   Procedure: CYSTOSCOPY WITH RIGHT STENT PULL  ;  Surgeon: Alexis Frock, MD;  Location: [facility];  Service: Urology;  Laterality: Bilateral;   CYSTOSCOPY WITH RETROGRADE PYELOGRAM, URETEROSCOPY AND STENT PLACEMENT Bilateral 02/01/2014   Procedure: CYSTOSCOPY WITH BILATERAL RETROGRADE CYSTOSCOPY, BILATERAL RETROGRADE, PYELOGRAM, RIGHT URETEROSCOPY, STONE BASKETRY,  AND BILATERAL STENT PLACEMENT;  Surgeon: Alexis Frock, MD;  Location: [facility];  Service: Urology;  Laterality: Bilateral;   CYSTOSCOPY WITH URETEROSCOPY AND STENT PLACEMENT Left 02/12/2021   Procedure: CYSTOSCOPY WITH LEFT URETEROSCOPY, BALLOON DILITATION, HOLMIUM LASER LITHOTRIPSY AND STENT PLACEMENT;  Surgeon: Lucas Mallow, MD;  Location: [facility];  Service: Urology;  Laterality: Left;   HOLMIUM LASER  APPLICATION Left 0/63/0160   Procedure: HOLMIUM LASER APPLICATION;  Surgeon: Alexis Frock, MD;  Location: [facility];  Service: Urology;  Laterality: Left;   INTRAVASCULAR ULTRASOUND/IVUS N/A 12/28/2021   Procedure: Intravascular Ultrasound/IVUS;  Surgeon: Jettie Booze, MD;  Location: [place] [facility];  Service: Cardiovascular;  Laterality: N/A;   LEFT HEART CATH AND CORONARY ANGIOGRAPHY N/A 12/28/2021   Procedure: LEFT HEART CATH AND CORONARY ANGIOGRAPHY;  Surgeon: Jettie Booze, MD;  Location: [place] [facility];  Service: Cardiovascular;  Laterality: N/A;   LITHOTRIPSY     MUSCLE BIOPSY Left 04/19/2022   Procedure: LEFT THIGH MUSCLE BIOPSY;  Surgeon: Erroll Luna, MD;  Location: [place];  Service: General;  Laterality: Left;   NEPHROLITHOTOMY Left 03/17/2014   Procedure: NEPHROLITHOTOMY PERCUTANEOUS with access;  Surgeon: Alexis Frock, MD;  Location: [facility];  Service: Urology;  Laterality: Left;   NEPHROLITHOTOMY Left 03/19/2014   Procedure: 2ND STAGE NEPHROLITHOTOMY PERCUTANEOUS left ureteroscopy with lasr stone treatment and stent placement, with access;  Surgeon: Alexis Frock, MD;  Location: [facility];  Service: Urology;  Laterality: Left;   TOTAL KNEE ARTHROPLASTY Right 09/02/2014   Procedure: RIGHT TOTAL KNEE ARTHROPLASTY;  Surgeon: Meredith Pel, MD;  Location: [place];  Service: Orthopedics;  Laterality: Right;   uretral stent     Patient Active Problem List   Diagnosis Date Noted   Diabetic peripheral neuropathy associated with type 2 diabetes  mellitus ([place]) 03/23/2022   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   Gait abnormality 03/07/2022   Cerebrovascular accident (CVA) ([place]) 03/07/2022   Myalgia due to statin 02/28/2022   Mild aortic stenosis 02/28/2022   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   Elevated CK 02/02/2022   Elevated LFTs 02/02/2022   CAD (coronary artery disease) 02/02/2022   Muscle weakness of all 4 extremities 02/02/2022   Hyperlipidemia 01/28/2022   Arthritis  of knee 09/02/2014   Staghorn kidney stones 03/17/2014   Nephrolithiasis 02/01/2014   Pneumonia 08/27/2011   Diabetes mellitus with coincident hypertension ([place]) 08/27/2011    PCP: Greig Right, MD  REFERRING PROVIDER: Cathlyn Parsons, PA-C   REFERRING DIAG: M33.20 (ICD-10-CM) - Polymyositis, organ involvement unspecified   THERAPY DIAG:  Muscle weakness (generalized)  Other abnormalities of gait and mobility  Polymyositis (H[facility])  Repeated falls  Other lack of coordination  Unsteadiness on feet  Difficulty in walking, not elsewhere classified  Abnormal posture  Rationale for Evaluation and Treatment Rehabilitation  ONSET DATE: 02/15/2022   SUBJECTIVE:   SUBJECTIVE STATEMENT: Doing pretty good, having some trouble with my sciatic nerve today.   PERTINENT HISTORY: [name] is a 66 y.o. right-handed male with history of type 2 diabetes mellitus hypertension intolerance of statin due to abnormal LFTs (off for the past years), CAD status post DES to RCA 12/28/2021 (addition of arterial for statin 20 mg, aspirin/Brilinta),   PAIN:  Are you having pain? No  PRECAUTIONS: None  WEIGHT BEARING RESTRICTIONS No  FALLS:  Has patient fallen in last 6 months? Yes. Number of falls 5  LIVING ENVIRONMENT: Lives with: lives with their family and lives alone Lives in: House/apartment Stairs: No Has following equipment at home: Gilford Rile - 4 wheeled, shower chair, Grab bars, and None  OCCUPATION: Comptroller, office work, Government social research officer.  PLOF: Independent  PATIENT GOALS Increase strength, return to his active lifestyle. 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 Travels to attend concerts.   OBJECTIVE:   MUSCLE LENGTH: Hamstrings: Right 62 deg; Left 68 deg   POSTURE: rounded shoulders   LOWER EXTREMITY ROM: WFL throughout, R hip with mild     tightness.   LOWER EXTREMITY MMT:  MMT Right eval Left eval Right 03/31/22  Left 03/31/22  Hip flexion 4- 3+ 4- 4-  Hip extension 4- 3+ 4- 3+  Hip abduction 4- 3+ 4- 3+  Hip adduction      Hip internal rotation      Hip external rotation      Knee flexion 4 4- 4 4  Knee extension 4 4- 4 4  Ankle dorsiflexion 4 4    Ankle plantarflexion      Ankle inversion      Ankle eversion       (Blank rows = not tested)   FUNCTIONAL TESTS:   [facility] PT Assessment - 05/19/22 0001       Berg Balance Test   Sit to Stand Able to stand without using hands and stabilize independently    Standing Unsupported Able to stand safely 2 minutes    Sitting with Back Unsupported but Feet Supported on Floor or Stool Able to sit safely and securely 2 minutes    Stand to Sit Sits safely with minimal use of hands    Transfers Able to transfer safely, definite need of hands    Standing Unsupported with Eyes Closed Able to stand 10 seconds safely    Standing Unsupported with Feet Together Able to  place feet together independently and stand 1 minute safely    From Standing, Reach Forward with Outstretched Arm Can reach confidently >25 cm (10")    From Standing Position, Pick up Object from [place] to pick up shoe safely and easily    From Standing Position, Turn to Look Behind Over each Shoulder Looks behind from both sides and weight shifts well    Turn 360 Degrees Able to turn 360 degrees safely one side only in 4 seconds or less    Standing Unsupported, Alternately Place Feet on Step/Stool Able to stand independently and complete 8 steps >20 seconds    Standing Unsupported, One Foot in [place] help to step but can hold 15 seconds    Standing on One Leg Tries to lift leg/unable to hold 3 seconds but remains standing independently    Total Score 47              [facility] PT Assessment - 05/19/22 0001       Berg Balance Test   Sit to Stand Able to stand without using hands and stabilize independently    Standing Unsupported Able to stand safely 2 minutes    Sitting with Back  Unsupported but Feet Supported on Floor or Stool Able to sit safely and securely 2 minutes    Stand to Sit Sits safely with minimal use of hands    Transfers Able to transfer safely, definite need of hands    Standing Unsupported with Eyes Closed Able to stand 10 seconds safely    Standing Unsupported with Feet Together Able to place feet together independently and stand 1 minute safely    From Standing, Reach Forward with Outstretched Arm Can reach confidently >25 cm (10")    From Standing Position, Pick up Object from Floor Able to pick up shoe safely and easily    From Standing Position, Turn to Look Behind Over each Shoulder Looks behind from both sides and weight shifts well    Turn 360 Degrees Able to turn 360 degrees safely one side only in 4 seconds or less    Standing Unsupported, Alternately Place Feet on Step/Stool Able to stand independently and complete 8 steps >20 seconds    Standing Unsupported, One Foot in Front Needs help to step but can hold 15 seconds    Standing on One Leg Tries to lift leg/unable to hold 3 seconds but remains standing independently    Total Score 47             5x STS18.22 BERG 43/56   TODAY'S TREATMENT: 05/19/22 Reassess  5xSTS- 14.20s BERG-47/56  Walking big loop outside Nustep L4 x29mns  Step up on airex x10 each side  Side steps on airex  Leg press 50# 2x10  Calf raises on leg press 40# 2x10  Pallof press 15# 2x10 each side 2 way hip 5# x10    05/17/22 Nustep L5 x684ms  Resisted gait 30# 4 way x 4  Leg ext 25# 3x10 HS curls 35# 3x10  Shoulder ext 5# 2x10 Cable rows 10# 2x10  STS with OHP on airex 2x10 Bicep curls 15# 2x10 Tricep ext 20# 2x10 Calf raises on black bar 2x12    05/12/22 Bike L5x6m32m  Step ups 6" forwards and lateral  Leg press 40# x10, 50# x10  5xSTS- 39.85s Walking on balance beam  Standing on airex hitting ball Marching on airex 5# AW Calf raises on airex 5# AW  OHP yellow ball on airex  2x10  05/10/22 Nustep L5 x64mns  Walk outside 1 big loop Resisted gait 20# 4x4 way Pallof press 10# 2x10 Leg ext 25# 3x10 HS curls 25# 3x10 Seated rows 25# 2x10 Lat pull downs 20# 2x10    05/04/22 Bike L5 x647ms  Lateral band walks greenTB  Step ups 6" 2x10 bilat Standing marches 5# 2x10  Step over half foam 5# 2x10 Walking on beam forwards and side steps   05/03/22 Bike L4 x6m11m  Seated rows 20# 2x10  Lat pull down 15# 2x10  Leg press 50# 2x10, x10 RLE eccentric  Calf raises on black bar 2x12  Shoulder ER 5# 2x10 2 way hip 5# abd/ext    04/27/22 STS with OHP red ball 2x10 Crunches on slant 2x10 Standing twists with greenTB 2x10  Knee flex 25# 2x15 Knee ext 20# x15, 25# x15 Shoulder ext 5# 2x10 Resisted gait 20# 4 way x4  04/14/22 Nustep L5 x6mi73mKnee ext 20# 3x10  HS curls 15# 3x10 Pallof press 5# 2x10  Calf raises on airex 2x12 BlackTB crunches 2x12  Leg press 40# 3x10   04/07/22 Bike L5 x6min52meg press 40# 2x10  Resisted gait 15# 5x forwards and sidesteps Hip ext 5# 2x10 each side  Crunches w/blackTB 2x15 Back ext black TB 2x15 Gastroc stretch on slant 1min 94m/11/23 Nustep L5 x5mins 59me L4 x5 mins  Walking up and down incline outside x2 Calf raises 2x10 Knee ext 20# 3x10 Knee flex 15# 3x10 Step ups 6" 2x10   03/30/22 Nustep L5 x6mins 573mS (unable to complete 5, stopped at 4 in 20s)  BERG (44/56) Walking 10 laps w/o cane  Cybex flex 15# 2x15 Cybex ext 20# 2x12  03/22/22 Nustep L4 x6mins ST74mn Airex 2x10 Walking outside on grass Walking down and up hill  Cybex knee flex 25# 2x10 Cybex knee ext 25# 2x10  03/17/22 Bike L4, 6mins Res66med gait 15# forwards and sideways x5 Hip abd and ext 3# 2x10 Side steps on foam Step overs 4" Walking without cane 3 laps   03/15/22 Nustep L5, 6mins UBE 56mwards/back 6 mins STS elevated table holding red ball 2x10 Crunches with black band 3x10 Step ups forwards and sideways 20reps 4" Standing  on foam feet together catch Tandem on foam 30s    03/10/22 Nustep L5, 6mins  Shou1mr flexion w/dowel 2x10  Bicep curls 4# 2x10 Walking without cane 3 laps CGA Step ups on foam CGA, 2 LOB  Standing on foam playing catch minA  2 way hip ext/abd 2x10       PATIENT EDUCATION:  Education details: POC Person educated: Patient Education method: Explanation, Tactile cuesCorporate treasurererbal cues Education comprehension: verbalized understanding, returned demonstration, verbal cues required, and tactile cues required    Patent educated about keeping lights on during the night for safety and to prevent future falls. Also educated about collection of HEP, and to not do all exercises but rather pick a few to do in order to avoid fatigue.  HOME EXERCISE PROGRAM: [id number]  [id number]:  CLINICAL IMPRESSION: 20th visit progress note. Patient has shown good improvements towards remaining LTGs. Improved scores for 5xSTS and BERG. He is able to walk the big loop outside wit ha faster and safer pace. CGA needed with step up on airex due to R foot catching, does better with side steps. minA needed LOB/stumble on airex.    OBJECTIVE IMPAIRMENTS Abnormal gait, decreased activity tolerance, decreased balance, decreased coordination, decreased endurance, decreased mobility, difficulty walking, decreased strength,  improper body mechanics, and postural dysfunction.   ACTIVITY LIMITATIONS carrying, lifting, bending, standing, squatting, stairs, transfers, bed mobility, and locomotion level  PARTICIPATION LIMITATIONS: meal prep, cleaning, laundry, driving, shopping, community activity, occupation, and yard work  PERSONAL FACTORS Past/current experiences are also affecting patient's functional outcome.   REHAB POTENTIAL: Good  CLINICAL DECISION MAKING: Evolving/moderate complexity  EVALUATION COMPLEXITY: Moderate   GOALS: Goals reviewed with patient? Yes  SHORT TERM GOALS: Target date: 03/17/2022   I  with basic HEP Baseline: Goal status: MET    LONG TERM GOALS: Target date: 06/12/2022   I with final HEP Baseline:  Goal status: INITIAL  2.  Patient will score at least 50/56 on BERG Balance Exam Baseline: 43, 03/31/22 (44), 43/56-8/15, 47/56-8/24 Goal status: IN PROGRESS  3.  Paitent will complete 5x sit to stand in < 12 sec to demonstrate improved strength in BLE. Baseline: 39.85s-8/17, 14.20s-8/24 Goal status: IN PROGRESS  4.  Patient will ambulate at least 1000' with LRAD, MI, on level and unlevel surfaces Baseline: 03/31/22 able to walk 1000' w/o AD on even surfaces, some fatigue towards end, SBA Goal status: MET  5.  Patient will be able to manage cooking and cleaning activities in his home without falling and without requiring a rest break. Baseline:  Goal status: MET   PLAN: PT FREQUENCY: 2x/week  PT DURATION: 12 weeks  PLANNED INTERVENTIONS: Therapeutic exercises, Therapeutic activity, Neuromuscular re-education, Balance training, Gait training, Patient/Family education, Joint mobilization, Stair training, and Manual therapy  PLAN FOR NEXT SESSION: gait and balance activities, core strengthening   Andris Baumann, DPT 05/19/2022, 10:10 AM

## 2022-05-18 NOTE — Addendum Note (Signed)
Addended by: Kathrine Cords on: 05/18/2022 08:17 AM   Modules accepted: Orders

## 2022-05-19 ENCOUNTER — Ambulatory Visit: Payer: Medicare Other | Admitting: Occupational Therapy

## 2022-05-19 ENCOUNTER — Ambulatory Visit: Payer: Medicare Other

## 2022-05-19 ENCOUNTER — Encounter: Payer: Self-pay | Admitting: Occupational Therapy

## 2022-05-19 DIAGNOSIS — M6281 Muscle weakness (generalized): Secondary | ICD-10-CM

## 2022-05-19 DIAGNOSIS — R262 Difficulty in walking, not elsewhere classified: Secondary | ICD-10-CM | POA: Diagnosis not present

## 2022-05-19 DIAGNOSIS — R2689 Other abnormalities of gait and mobility: Secondary | ICD-10-CM

## 2022-05-19 DIAGNOSIS — M25511 Pain in right shoulder: Secondary | ICD-10-CM | POA: Diagnosis not present

## 2022-05-19 DIAGNOSIS — R293 Abnormal posture: Secondary | ICD-10-CM | POA: Diagnosis not present

## 2022-05-19 DIAGNOSIS — R296 Repeated falls: Secondary | ICD-10-CM | POA: Diagnosis not present

## 2022-05-19 DIAGNOSIS — R278 Other lack of coordination: Secondary | ICD-10-CM

## 2022-05-19 DIAGNOSIS — R2681 Unsteadiness on feet: Secondary | ICD-10-CM | POA: Diagnosis not present

## 2022-05-19 DIAGNOSIS — M25512 Pain in left shoulder: Secondary | ICD-10-CM

## 2022-05-19 DIAGNOSIS — M3320 Polymyositis, organ involvement unspecified: Secondary | ICD-10-CM | POA: Diagnosis not present

## 2022-05-19 NOTE — Therapy (Signed)
OUTPATIENT OCCUPATIONAL THERAPY TREATMENT NOTE   Patient Name: [name] MRN: [id number] DOB:1956-06-30, 66 y.o., male Today's Date: 05/19/2022  PCP: Greig Right, MD REFERRING PROVIDER: Cathlyn Parsons, PA-C    OT End of Session - 05/19/22 1019     Visit Number 19    Number of Visits 26    Date for OT Re-Evaluation 06/17/22    Authorization Type BCBS Medicare    Authorization Time Period VL: [place]    OT Start Time 1016    OT Stop Time 1056    OT Time Calculation (min) 40 min    Activity Tolerance Patient tolerated treatment well    Behavior During Therapy WFL for tasks assessed/performed            Past Medical History:  Diagnosis Date   Aortic stenosis    mild AS 02/04/22   Arthritis    Diabetes mellitus    fasting 120s   History of kidney stones    Hypertension    Myocardial infarction ([place])    stent placed   Peripheral neuropathy    left fifth finger, left toe   Pneumonia    hx of   Past Surgical History:  Procedure Laterality Date   APPENDECTOMY     CORONARY STENT INTERVENTION N/A 12/28/2021   Procedure: CORONARY STENT INTERVENTION;  Surgeon: Jettie Booze, MD;  Location: [facility];  Service: Cardiovascular;  Laterality: N/A;   CYSTOSCOPY W/ URETERAL STENT PLACEMENT Bilateral 03/17/2014   Procedure: CYSTOSCOPY WITH RIGHT STENT PULL  ;  Surgeon: Alexis Frock, MD;  Location: [facility];  Service: Urology;  Laterality: Bilateral;   CYSTOSCOPY WITH RETROGRADE PYELOGRAM, URETEROSCOPY AND STENT PLACEMENT Bilateral 02/01/2014   Procedure: CYSTOSCOPY WITH BILATERAL RETROGRADE CYSTOSCOPY, BILATERAL RETROGRADE, PYELOGRAM, RIGHT URETEROSCOPY, STONE BASKETRY,  AND BILATERAL STENT PLACEMENT;  Surgeon: Alexis Frock, MD;  Location: [facility];  Service: Urology;  Laterality: Bilateral;   CYSTOSCOPY WITH URETEROSCOPY AND STENT PLACEMENT Left 02/12/2021   Procedure: CYSTOSCOPY WITH LEFT URETEROSCOPY, BALLOON DILITATION, HOLMIUM LASER LITHOTRIPSY AND STENT  PLACEMENT;  Surgeon: Lucas Mallow, MD;  Location: [facility];  Service: Urology;  Laterality: Left;   HOLMIUM LASER APPLICATION Left 12/17/5571   Procedure: HOLMIUM LASER APPLICATION;  Surgeon: Alexis Frock, MD;  Location: [facility];  Service: Urology;  Laterality: Left;   INTRAVASCULAR ULTRASOUND/IVUS N/A 12/28/2021   Procedure: Intravascular Ultrasound/IVUS;  Surgeon: Jettie Booze, MD;  Location: [facility];  Service: Cardiovascular;  Laterality: N/A;   LEFT HEART CATH AND CORONARY ANGIOGRAPHY N/A 12/28/2021   Procedure: LEFT HEART CATH AND CORONARY ANGIOGRAPHY;  Surgeon: Jettie Booze, MD;  Location: [facility];  Service: Cardiovascular;  Laterality: N/A;   LITHOTRIPSY     MUSCLE BIOPSY Left 04/19/2022   Procedure: LEFT THIGH MUSCLE BIOPSY;  Surgeon: Erroll Luna, MD;  Location: [facility];  Service: General;  Laterality: Left;   NEPHROLITHOTOMY Left 03/17/2014   Procedure: NEPHROLITHOTOMY PERCUTANEOUS with access;  Surgeon: Alexis Frock, MD;  Location: [facility];  Service: Urology;  Laterality: Left;   NEPHROLITHOTOMY Left 03/19/2014   Procedure: 2ND STAGE NEPHROLITHOTOMY PERCUTANEOUS left ureteroscopy with lasr stone treatment and stent placement, with access;  Surgeon: Alexis Frock, MD;  Location: [facility];  Service: Urology;  Laterality: Left;   TOTAL KNEE ARTHROPLASTY Right 09/02/2014   Procedure: RIGHT TOTAL KNEE ARTHROPLASTY;  Surgeon: Meredith Pel, MD;  Location: [facility];  Service: Orthopedics;  Laterality: Right;   uretral stent  Patient Active Problem List   Diagnosis Date Noted   Diabetic peripheral neuropathy associated with type 2 diabetes mellitus ([place]) 03/23/2022   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   Gait abnormality 03/07/2022   Cerebrovascular accident (CVA) ([place]) 03/07/2022   Myalgia due to statin 02/28/2022   Mild aortic stenosis 02/28/2022   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   Elevated CK 02/02/2022   Elevated LFTs 02/02/2022   CAD (coronary  artery disease) 02/02/2022   Muscle weakness of all 4 extremities 02/02/2022   Hyperlipidemia 01/28/2022   Arthritis of knee 09/02/2014   [place]ag[place] kidney stones 03/17/2014   Nephrolithiasis 02/01/2014   Pneumonia 08/27/2011   Diabetes mellitus with coincident hypertension ([place]) 08/27/2011   ONSET DATE: 12/27/21  REFERRING DIAG: M33.20 (ICD-10-CM) - Polymyositis, organ involvement unspecified   THERAPY DIAG:  Muscle weakness (generalized)  Right shoulder pain, unspecified chronicity  Left shoulder pain, unspecified chronicity  Other abnormalities of gait and mobility  Rationale for Evaluation and Treatment Rehabilitation  PERTINENT HISTORY: Suspected polymyositis; non-ST-elevation myocardial infarction (NSTEMI) s/p percutaneous coronary intervention (PCI) and drug-eluting stent (DES) placement 12/28/21; incidental small silent brainstem stroke (R pons infarct) identified per MRI; cervical spine neuroforaminal stenosis  R shoulder partial to near full-thickness supraspinatus tear; bilateral shoulder pain w/ R > L  Additional PMH: CAD, DMT2, HTN, dyslipidemia, nephrolithiasis, recent transaminitis, h/o stage I prostate cancer, and R TKA in 2016  PRECAUTIONS: Fall; gradual return to driving  SUBJECTIVE:  SUBJECTIVE STATEMENT: Pt reports he will not be getting back in the pool for 2-3 more weeks because of scheduling conflicts  PAIN: Are you having pain? 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 OT provided physical assist 20% of the time for RUE toward end of each set  Bilateral UE rows; 10 lb; 2x15 w/out difficulty  Shoulder Alphabet Using 2.2 lb medball against wall while standing x2 w/ each UE. OT provided verbal/tactile cues when completing w/ LUE to decr compensatory shoulder hiking  Shoulder Flexion Rolling 75 cm exercise ball forward while seated to facilitate shoulder flexion stretch w/ add'l benefit of core engagement to return to midline; completed 2x10 w/out difficulty  Core Strengthening Trunk extension holding black theraband w/ both hands while seated; 2x15     *Rest breaks taken between all exercises and between sets prn due to activity tolerance   PATIENT EDUCATION: Ongoing condition-specific education related to therapeutic interventions completed this session Person educated: Patient Education method: Explanation Education comprehension: verbalized understanding  ------------------------------------------------------------------------------------------------------------------------------------------------------  UPPER EXTREMITY ROM     Active ROM Right Eval - 5/25  6/27  7/6  8/3  8/17  Shoulder flexion 51 48 52 73 128  Shoulder abduction 50 68 63 81 92  Shoulder extension 40      Shoulder IR 80      Shoulder ER 40    73  Elbow flexion 135      Elbow extension 41 3     (Blank rows = not tested)  Active ROM Left Eval - 5/25  6/27  7/6  8/3  8/17  Shoulder flexion 55 79 96 147 161  Shoulder abduction 58 87     Shoulder extension 47      Shoulder IR 84      Shoulder ER 35      Elbow flexion 138      Elbow extension 6 4     (Blank rows = not tested)  UPPER EXTREMITY MMT:     MMT Right 8/17 Left 8/17  Shoulder flexion 3/5 3+/5  Shoulder abduction 3/5 3+/5  Shoulder adduction 4-/5 4-/5  Shoulder extension 4-/5 4-/5  Shoulder internal rotation 4/5 4/5  Shoulder external rotation 4-/5 4-/4  Elbow flexion  4/5 4/5  Elbow extension 4/5 4/5  (Blank rows = not tested)  ------------------------------------------------------------------------------------------------------------------------------------------------------  HOME EXERCISE PROGRAM: MedBridge Code: [id number] - Supine Shoulder Flexion AAROM with Hands Clasped  - 2 x daily - 1 sets - 10 reps - 5 sec hold - Shoulder Flexion Wall Slide with Towel  - 2 x daily - 7 x weekly - 2 sets - 5 reps - Seated Shoulder Flexion with Dowel to 90  - 2 x daily - 2-3 sets - 10-15 reps - Seated Shoulder Row with Anchored Resistance  - 2 x daily - 2-3 sets - 10-15 reps - Seated Shoulder External Rotation with Dumbbells  - 2 x daily - 2-3 sets - 10-15 reps - Seated Upper Trapezius Stretch  - 2 x daily - 7 x weekly - 1 sets - 25 reps - Gentle Levator Scapulae Stretch  - 2 x daily - 7 x weekly - 1 sets - 25 reps  GOALS: Goals reviewed with patient? Yes  SHORT TERM GOALS: Target date: 03/31/22  STG  Status:  1 Pt will verbalize understanding of at least 3 fall prevention and/or energy conservation strategies to increase safety w/ functional mobility at home Baseline: Decreased knowledge of strategies Met - 02/24/22  2 Pt will demonstrate independence w/ recommended DME and/or AE (e.g., long-handle sponge, dressing stick, etc.) to improve participation and safety w/ ADLs Baseline: Decreased knowledge of AE Met - 03/31/22  3 Pt will improve functional use of BUEs as evidence by being able to achieve at least 70 degrees of shoulder flexion w/out pain Baseline: RUE: 51 deg, LUE: 55 deg Met - 03/31/22:  RUE: 79 deg,  LUE: 96 deg  4 Pt will improve functional bilateral reach by increasing R elbow extension to at least 25 degrees Baseline: 41* of flexion when attempting extension Met - 03/22/22; WFL    LONG TERM GOALS: Target date: 06/11/22  LTG  Status:  1 Pt will demonstrate independence w/ full HEP designed for ROM and strength of BUEs Baseline: No HEP at this time  Met - 04/28/22  2 Pt will improve QuickDASH score by at least 19 points (40) to indicate mild functional impairment Baseline: 59.1 (moderate, borderline severe) impairment) Met - 04/07/22: 22.7  3 Pt will improve functional use of BUEs during grooming tasks (e.g., shampooing hair) as evidenced by improving strength of BUE shoulder flexion to at least 3+/5  Baseline: poor Progressing - 05/12/22: RUE 3/5, LUE 3+/5  4 With both hands, pt will be able to grip w/ at least 20 lbs of force to improve functional use of BUEs during IADLs Baseline: Unable to assess at eval due to medical restrictions Met - 04/07/22: RUE 56 lbs, LUE 73 lbs  5 Pt will be able to retrieve lightweight object (< 5 lbs) from overhead height w/out pain  by d/c Baseline: Unable to reach overhead Met - 04/07/22  6 Pt will be able to retrieve medium-weight object (~5 lbs) from both low and overhead heights by d/c Baseline: Difficulty retrieving cast iron skillet from oven Progressing  7 Pt will report being able to consistently complete bed mobility within acceptable amount of time w/out UE support Baseline: currently taking about 2 minutes; reports increased difficulty rolling/pushing up toward R side > L side Progressing    ASSESSMENT:  CLINICAL IMPRESSION: Treatment today continued focus on BUE strengthening and core stability for improved safety and efficiency w/ higher level IADL and leisure tasks. Pt demonstrates improvements, particularly w/ UB strength each session. OT increased challenge to muscle endurance this session w/ pt able to tolerate all exercises w/ decreased time between sets compared to prior sessions. Pt continues to appear encouraged by progress and is making good gains toward remaining functional goals. Will to address remaining functional goals and maximize functional use of BUEs in working toward returning to PLOF.  PERFORMANCE DEFICITS in functional skills including ADLs, IADLs, ROM, strength, pain, GMC,  mobility, balance, body mechanics, endurance, cardiopulmonary status limiting function, decreased knowledge of precautions, decreased knowledge of use of DME, and UE functional use.   IMPAIRMENTS are limiting patient from ADLs, IADLs, rest and sleep, work, leisure, and social participation.   COMORBIDITIES has co-morbidities such as NSTEMI, CAD, and DM  that affects occupational performance. Patient will benefit from skilled OT to address above impairments and improve overall function.   PLAN: OT FREQUENCY: 2x/week (x1 in clinic; x1 in pool w/ aquatic OT)  OT DURATION: 10 weeks  PLANNED INTERVENTIONS: self care/ADL training, therapeutic exercise, therapeutic activity, manual therapy, passive range of motion, functional mobility training, aquatic therapy, moist heat, cryotherapy, patient/family education, energy conservation, DME and/or AE instructions, and Re-evaluation  RECOMMENDED OTHER SERVICES: Currently receiving PT services at this location  CONSULTED AND AGREED WITH PLAN OF CARE: Patient  PLAN FOR NEXT SESSION: Continue to focus on core and BUE strength (R more impacted than L)   Kathrine Cords, MSOT, OTR/L 05/19/2022, 11:22 AM

## 2022-05-20 ENCOUNTER — Other Ambulatory Visit: Payer: Self-pay | Admitting: Pharmacist

## 2022-05-20 DIAGNOSIS — T466X5A Adverse effect of antihyperlipidemic and antiarteriosclerotic drugs, initial encounter: Secondary | ICD-10-CM

## 2022-05-20 DIAGNOSIS — I2510 Atherosclerotic heart disease of native coronary artery without angina pectoris: Secondary | ICD-10-CM

## 2022-05-20 DIAGNOSIS — E785 Hyperlipidemia, unspecified: Secondary | ICD-10-CM

## 2022-05-23 ENCOUNTER — Ambulatory Visit: Payer: Medicare Other | Admitting: Neurology

## 2022-05-23 ENCOUNTER — Encounter: Payer: Self-pay | Admitting: Neurology

## 2022-05-23 VITALS — BP 149/63 | HR 78 | Ht 70.0 in | Wt 187.0 lb

## 2022-05-23 DIAGNOSIS — E1142 Type 2 diabetes mellitus with diabetic polyneuropathy: Secondary | ICD-10-CM

## 2022-05-23 DIAGNOSIS — I639 Cerebral infarction, unspecified: Secondary | ICD-10-CM | POA: Diagnosis not present

## 2022-05-23 DIAGNOSIS — G7249 Other inflammatory and immune myopathies, not elsewhere classified: Secondary | ICD-10-CM

## 2022-05-23 DIAGNOSIS — R269 Unspecified abnormalities of gait and mobility: Secondary | ICD-10-CM

## 2022-05-23 NOTE — Progress Notes (Addendum)
Chief Complaint  Patient presents with   Follow-up    Rm 7, with son States he is doing well, regaining strength,able to walk more and no longer using walker       ASSESSMENT AND PLAN  [name] is a 66 y.o. male   Inflammatory myopathy  Significantly elevated CPK up to 5400,   Responding well to prednisone treatment, started as 80 mg since May 2023, with significant side effect, poorly controlled diabetes,  Prednisone has been decreased to 20 mg daily since beginning of August 2023, tolerating it better,  Methotrexate 2.5 mg 6 tablets since June 2023,  IVIG, loading dose since August 2023, maintenance dose 1 g/kg every 3 to 4 weeks, tolerating it well,  Muscle biopsy of left vastus lateralis muscle on April 19, 2022 confirmed minimum abnormal muscle suggestive of necrosis without clear inflammation, something for side and surrounding muscle fibers  He responded very well to current combination therapy, but continue has mild to moderate proximal muscle weakness, repeat CPK, CMP CBC, A1c,  If CPK begin to normalize, may consider further slow taper off prednisone   Right pontine stroke Coronary artery disease  With mild residual left upper and lower extremity weakness, hyperreflexia on the left side,  Vascular risk factor of aging, hypertension, hyperlipidemia, diabetes  On aspirin and Plavix   Cardiology follow-up pending   DIAGNOSTIC DATA (LABS, IMAGING, TESTING) - I reviewed patient records, labs, notes, testing and imaging myself where available. Addendum, patient was seen by Dr. Beryle Lathe Cornett on April 01, 2022, for muscle biopsy is planned,  MEDICAL HISTORY:  [name] is a 66 year old male, seen in request by his primary care physician Dr. Greig Right, to follow-up for stroke, inflammatory myopathy, initial evaluation was with her daughter on March 07, 2022,  I reviewed and summarized the referring note. PMHx. HTN CAD, April 2023, was on brilinta DM  since 2017  He has been physically active, suffered heart attack in April 2023 while pushing mowing his lawn, was noted to have elevated troponin, underwent stent, double antiplatelet treatment aspirin and Brilinta  During that hospital admission, he was found to have elevated abnormal liver functional test, progressive weakness since the end of 2022, his atorvastatin 20 mg was on hold since then  Hospital admission on Feb 02, 2022 for weakness, to the point his father has to help him to get into the car, laboratory evaluation showed CPK 7291, AST 181, ALT  CT of the chest abdomen and pelvis with contrast negative for evidence of metastatic disease, extensive coronary artery calcification  He was seen by neurologist, with his presentation of symmetric bilateral upper and lower extremity weakness, consistent with inflammatory myositis  MRI of the brain showed acute infarction involving ventral pontine  CT angiogram of head and neck showed right ICA stenosis measuring at least 70% proximally, 70% at the paraclinoid segment, 50% left internal carotid artery stenosis, bilateral vertebral origin stenosis measuring 50% on the right 40% on the left  Laboratory evaluations in June 2023, CPK 991, CBC showed elevated WBC 21.7 hemoglobin 13.7, BMP creatinine 0.5,  HMGCR ig antibody was significantly elevated 97 units, which may be associated with necrotizing autoimmune myopathy in a subset of statin treated patients, although infrequent, this antibody may also be observed in statin nave patient with necrotizing autoimmune myopathy,  Normal magnesium, phosphate, elevated ALT 262, AST 65, A1c 7.4, LDL was 97.8, normal TSH, negative HIV, negative D-dimer, lipase,  MRI of cervical spine showed  multilevel degenerative changes, no significant canal stenosis, C3-4 severe left, C5-6 severe right, C6-7 severe left and moderate right neuroforaminal stenosis  He was treated with Solu-Medrol 1 g daily for 3 days  followed by p.o. prednisone tapering starting from 80 mg daily, decrement of 10 mg every 1 week, will be taking 50 mg March 08, 2022  Case was discussed with Dr. Saverio Danker of general surgeon, planning on having muscle biopsy, he was discharged to rehabilitation, follow-up visit did showed improvement of his muscle weakness, was able to ambulate, the response to steroid also favored the diagnosis of inflammatory myopathy, he was discharged to inpatient rehabilitation, discharged on Feb 16, 2022, he now lives alone at home, ambulated with a cane  Emergency presentation 3 days later Feb 19, 2022, fell, hit his left frontal region on the back,  CT head showed no acute abnormality  He was not on any antiplatelet agent prior to hospital admission, now taking aspirin and Plavix, he has some improvement, but far from his baseline, has difficulty raising arm overhead, could not put plate in the cabinet, gait abnormality,  UPDATE August 28th 2023: He is with his son at today's clinical visit, left breast symptomatic for muscle biopsy on April 19, 2022, showed minimum abnormal muscles suggestive of necrosis without clear inflammation, some lymphocyte as seen surrounding muscle fibers,  He received IVIG at the beginning of August 2023, tolerating it well, maintenance 1 g/kg is scheduled for August 29/30,  Methotrexate 2.5 mg 6 tablets every Monday, along with folic acid, tolerating it well  Prednisone has been decreased to 20 mg daily since August 2023, no longer have significant side effect  He reported 75% back to baseline, ambulating without cane, still mild unsteady gait, need push-up to get up from seated position, better range of motion of upper extremity, water therapy has been helpful   PHYSICAL EXAM:   Vitals:   05/23/22 1256  BP: (!) 149/63  Pulse: 78  Weight: 187 lb (84.8 kg)  Height: '5\' 10"'$  (1.778 m)     Body mass index is 26.83 kg/m.  PHYSICAL EXAMNIATION:  Gen: NAD,  conversant, well nourised, well groomed                     Cardiovascular: Regular rate rhythm, no peripheral edema, warm, nontender. Eyes: Conjunctivae clear without exudates or hemorrhage Neck: Supple, no carotid bruits. Pulmonary: Clear to auscultation bilaterally   NEUROLOGICAL EXAM:  MENTAL STATUS: Speech/cognition: Awake, alert, oriented to history taking and casual conversation CRANIAL NERVES: CN II: Visual fields are full to confrontation. Pupils are round equal and briskly reactive to light. CN III, IV, VI: extraocular movement are normal. No ptosis. CN V: Facial sensation is intact to light touch CN VII: Face is symmetric with normal eye closure  CN VIII: Hearing is normal to causal conversation. CN IX, X: Phonation is normal. CN XI: Head turning and shoulder shrug are intact  MOTOR: Limited range of motion of right shoulder, due to right shoulder rotator cuff injury, mild neck flexion weakness UE Shoulder Abduction Shoulder External Rotation Elbow Flexion Elbow  Extension Wrist Flexion Wrist Extension Grip  R '4 4 4 4 5 5 5  '$ L '4 4 4 4 5 5 5   '$ LE Hip Flexion Knee flexion Knee extension Ankle Dorsiflexion  R 4 4+ 4+ 5-  L 4 4+ 4+ 5     REFLEXES: Reflexes are mildly hyperreflexia on left upper extremity, patella, bilateral Babinski sign  SENSORY: Intact to light touch, pinprick and vibratory sensation are intact in fingers and toes.  COORDINATION: There is no trunk or limb dysmetria noted.  GAIT/STANCE: Can get up from seated position arm crossed with effort, wide-based, cautious gait  REVIEW OF SYSTEMS:  Full 14 system review of systems performed and notable only for as above All other review of systems were negative.   ALLERGIES: Allergies  Allergen Reactions   Atorvastatin Calcium Other (See Comments)    Statin induced myopathy and transaminitis    HOME MEDICATIONS: Current Outpatient Medications  Medication Sig Dispense Refill   acetaminophen  (TYLENOL) 325 MG tablet Take 1-2 tablets (325-650 mg total) by mouth every 4 (four) hours as needed for mild pain.     amLODipine (NORVASC) 10 MG tablet Take 1 tablet (10 mg total) by mouth every morning. 30 tablet 0   aspirin 81 MG EC tablet Take 1 tablet (81 mg total) by mouth daily. Swallow whole. 90 tablet 3   carvedilol (COREG) 12.5 MG tablet Take 1 tablet (12.5 mg total) by mouth 2 (two) times daily with a meal. 60 tablet 11   cholecalciferol (VITAMIN D) 1000 units tablet Take 1 tablet (1,000 Units total) by mouth daily. 30 tablet 0   citalopram (CELEXA) 20 MG tablet Take 1 tablet (20 mg total) by mouth daily. 30 tablet 0   clopidogrel (PLAVIX) 75 MG tablet 1st dose take 300 mg then 75 mg daily (Patient taking differently: Take 75 mg by mouth daily.) 94 tablet 3   Cyanocobalamin (VITAMIN B-12) 2500 MCG SUBL Place 1 tablet (2,500 mcg total) under the tongue daily. 30 tablet 0   cyclobenzaprine (FLEXERIL) 10 MG tablet Take 1 tablet (10 mg total) by mouth at bedtime. 30 tablet 0   diclofenac Sodium (VOLTAREN) 1 % GEL Apply 2 g topically 4 (four) times daily. (Patient taking differently: Apply 2 g topically 3 (three) times daily as needed (pain).) 100 g 0   Evolocumab (REPATHA SURECLICK) 580 MG/ML SOAJ Inject 1 pen. into the skin every 14 (fourteen) days. 2 mL 11   folic acid (FOLVITE) 1 MG tablet Take 1 mg by mouth daily.     insulin glargine (LANTUS) 100 UNIT/ML Solostar Pen Inject 12 Units into the skin 2 (two) times daily. (Patient taking differently: Inject 14 Units into the skin 2 (two) times daily.) 15 mL 11   insulin lispro (HUMALOG) 100 UNIT/ML KwikPen Inject 5 Units into the skin every evening.     lisinopril (ZESTRIL) 40 MG tablet Take 1 tablet (40 mg total) by mouth every morning. 30 tablet 0   metFORMIN (GLUCOPHAGE) 500 MG tablet Take 2 tablets (1,000 mg total) by mouth 2 (two) times daily with a meal. 60 tablet 0   methotrexate (RHEUMATREX) 2.5 MG tablet Take 6 tablets (15 mg total)  by mouth once a week. Caution:Chemotherapy. Protect from light. (Patient taking differently: Take 15 mg by mouth every Monday. Caution:Chemotherapy. Protect from light.) 30 tablet 11   Multiple Vitamins-Minerals (MULTIVITAMINS THER. W/MINERALS) TABS Take 1 tablet by mouth daily.     nitroGLYCERIN (NITROSTAT) 0.4 MG SL tablet Place 1 tablet (0.4 mg total) under the tongue every 5 (five) minutes x 3 doses as needed for chest pain. 25 tablet 12   Omega-3 Fatty Acids (FISH OIL) 1200 MG CAPS Take 1,200 mg by mouth in the morning and at bedtime.     oxyCODONE (OXY IR/ROXICODONE) 5 MG immediate release tablet Take 1 tablet (5 mg total) by mouth every 6 (  six) hours as needed for severe pain. 15 tablet 0   predniSONE (DELTASONE) 10 MG tablet 4 tabs every morning for one week 3 tabs every morning for one week. Then keep 2 tabs every morning. (Patient taking differently: Take 20 mg by mouth daily. 4 tabs every morning for one week 3 tabs every morning for one week. Then keep 2 tabs every morning.) 90 tablet 3   No current facility-administered medications for this visit.    PAST MEDICAL HISTORY: Past Medical History:  Diagnosis Date   Aortic stenosis    mild AS 02/04/22   Arthritis    Diabetes mellitus    fasting 120s   History of kidney stones    Hypertension    Myocardial infarction ([place])    stent placed   Peripheral neuropathy    left fifth finger, left toe   Pneumonia    hx of    PAST SURGICAL HISTORY: Past Surgical History:  Procedure Laterality Date   APPENDECTOMY     CORONARY STENT INTERVENTION N/A 12/28/2021   Procedure: CORONARY STENT INTERVENTION;  Surgeon: Jettie Booze, MD;  Location: [facility];  Service: Cardiovascular;  Laterality: N/A;   CYSTOSCOPY W/ URETERAL STENT PLACEMENT Bilateral 03/17/2014   Procedure: CYSTOSCOPY WITH RIGHT STENT PULL  ;  Surgeon: Alexis Frock, MD;  Location: [facility];  Service: Urology;  Laterality: Bilateral;   CYSTOSCOPY WITH  RETROGRADE PYELOGRAM, URETEROSCOPY AND STENT PLACEMENT Bilateral 02/01/2014   Procedure: CYSTOSCOPY WITH BILATERAL RETROGRADE CYSTOSCOPY, BILATERAL RETROGRADE, PYELOGRAM, RIGHT URETEROSCOPY, STONE BASKETRY,  AND BILATERAL STENT PLACEMENT;  Surgeon: Alexis Frock, MD;  Location: [facility];  Service: Urology;  Laterality: Bilateral;   CYSTOSCOPY WITH URETEROSCOPY AND STENT PLACEMENT Left 02/12/2021   Procedure: CYSTOSCOPY WITH LEFT URETEROSCOPY, BALLOON DILITATION, HOLMIUM LASER LITHOTRIPSY AND STENT PLACEMENT;  Surgeon: Lucas Mallow, MD;  Location: [facility];  Service: Urology;  Laterality: Left;   HOLMIUM LASER APPLICATION Left 01/18/9562   Procedure: HOLMIUM LASER APPLICATION;  Surgeon: Alexis Frock, MD;  Location: [facility];  Service: Urology;  Laterality: Left;   INTRAVASCULAR ULTRASOUND/IVUS N/A 12/28/2021   Procedure: Intravascular Ultrasound/IVUS;  Surgeon: Jettie Booze, MD;  Location: [facility];  Service: Cardiovascular;  Laterality: N/A;   LEFT HEART CATH AND CORONARY ANGIOGRAPHY N/A 12/28/2021   Procedure: LEFT HEART CATH AND CORONARY ANGIOGRAPHY;  Surgeon: Jettie Booze, MD;  Location: [facility];  Service: Cardiovascular;  Laterality: N/A;   LITHOTRIPSY     MUSCLE BIOPSY Left 04/19/2022   Procedure: LEFT THIGH MUSCLE BIOPSY;  Surgeon: Erroll Luna, MD;  Location: [facility];  Service: General;  Laterality: Left;   NEPHROLITHOTOMY Left 03/17/2014   Procedure: NEPHROLITHOTOMY PERCUTANEOUS with access;  Surgeon: Alexis Frock, MD;  Location: [facility];  Service: Urology;  Laterality: Left;   NEPHROLITHOTOMY Left 03/19/2014   Procedure: 2ND STAGE NEPHROLITHOTOMY PERCUTANEOUS left ureteroscopy with lasr stone treatment and stent placement, with access;  Surgeon: Alexis Frock, MD;  Location: [facility];  Service: Urology;  Laterality: Left;   TOTAL KNEE ARTHROPLASTY Right 09/02/2014   Procedure: RIGHT TOTAL KNEE ARTHROPLASTY;  Surgeon: Meredith Pel, MD;  Location: [facility];   Service: Orthopedics;  Laterality: Right;   uretral stent      FAMILY HISTORY: Family History  Problem Relation Age of Onset   Alzheimer's disease Mother    Heart attack Father     SOCIAL HISTORY: Social History   Socioeconomic History   Marital status: Widowed  Spouse name: Not on file   Number of children: 2   Years of education: 3 yeae   Highest education level: Not on file  Occupational History   Not on file  Tobacco Use   Smoking status: Never   Smokeless tobacco: Never  Vaping Use   Vaping Use: Never used  Substance and Sexual Activity   Alcohol use: Yes    Alcohol/week: 1.0 standard drink of alcohol    Types: 1 Cans of beer per week    Comment: occasional/ 1 beers week   Drug use: No   Sexual activity: Yes    Birth control/protection: Other-see comments  Other Topics Concern   Not on file  Social History Narrative   Not on file   Social Determinants of Health   Financial Resource Strain: Not on file  Food Insecurity: Not on file  Transportation Needs: Not on file  Physical Activity: Not on file  Stress: Not on file  Social Connections: Not on file  Intimate Partner Violence: Not on file      Marcial Pacas, M.D. Ph.D.  [facility] [address] [place], [place] [address] Ph: [phone] Fax: [phone]  CC:  Greig Right, MD [place],  [place] [address]  Greig Right, MD

## 2022-05-24 ENCOUNTER — Telehealth: Payer: Self-pay | Admitting: Neurology

## 2022-05-24 DIAGNOSIS — G7249 Other inflammatory and immune myopathies, not elsewhere classified: Secondary | ICD-10-CM

## 2022-05-24 LAB — CBC WITH DIFFERENTIAL
Basophils Absolute: 0 10*3/uL (ref 0.0–0.2)
Basos: 0 %
EOS (ABSOLUTE): 0 10*3/uL (ref 0.0–0.4)
Eos: 0 %
Hematocrit: 33.4 % — ABNORMAL LOW (ref 37.5–51.0)
Hemoglobin: 11.1 g/dL — ABNORMAL LOW (ref 13.0–17.7)
Immature Grans (Abs): 0.1 10*3/uL (ref 0.0–0.1)
Immature Granulocytes: 1 %
Lymphocytes Absolute: 1.4 10*3/uL (ref 0.7–3.1)
Lymphs: 13 %
MCH: 35 pg — ABNORMAL HIGH (ref 26.6–33.0)
MCHC: 33.2 g/dL (ref 31.5–35.7)
MCV: 105 fL — ABNORMAL HIGH (ref 79–97)
Monocytes Absolute: 0.5 10*3/uL (ref 0.1–0.9)
Monocytes: 5 %
Neutrophils Absolute: 8.5 10*3/uL — ABNORMAL HIGH (ref 1.4–7.0)
Neutrophils: 81 %
RBC: 3.17 x10E6/uL — ABNORMAL LOW (ref 4.14–5.80)
RDW: 13.3 % (ref 11.6–15.4)
WBC: 10.5 10*3/uL (ref 3.4–10.8)

## 2022-05-24 LAB — HGB A1C W/O EAG: Hgb A1c MFr Bld: 5.7 % — ABNORMAL HIGH (ref 4.8–5.6)

## 2022-05-24 LAB — COMPREHENSIVE METABOLIC PANEL
ALT: 60 IU/L — ABNORMAL HIGH (ref 0–44)
AST: 34 IU/L (ref 0–40)
Albumin/Globulin Ratio: 2 (ref 1.2–2.2)
Albumin: 4.2 g/dL (ref 3.9–4.9)
Alkaline Phosphatase: 49 IU/L (ref 44–121)
BUN/Creatinine Ratio: 33 — ABNORMAL HIGH (ref 10–24)
BUN: 16 mg/dL (ref 8–27)
Bilirubin Total: 0.3 mg/dL (ref 0.0–1.2)
CO2: 27 mmol/L (ref 20–29)
Calcium: 9.2 mg/dL (ref 8.6–10.2)
Chloride: 102 mmol/L (ref 96–106)
Creatinine, Ser: 0.48 mg/dL — ABNORMAL LOW (ref 0.76–1.27)
Globulin, Total: 2.1 g/dL (ref 1.5–4.5)
Glucose: 238 mg/dL — ABNORMAL HIGH (ref 70–99)
Potassium: 4.1 mmol/L (ref 3.5–5.2)
Sodium: 144 mmol/L (ref 134–144)
Total Protein: 6.3 g/dL (ref 6.0–8.5)
eGFR: 115 mL/min/{1.73_m2} (ref >59)

## 2022-05-24 LAB — CK: Total CK: 523 U/L (ref 41–331)

## 2022-05-24 NOTE — Telephone Encounter (Signed)
Orders Placed This Encounter  Procedures   CBC with Differential/Platelets   Comprehensive metabolic panel

## 2022-05-25 ENCOUNTER — Other Ambulatory Visit: Payer: Self-pay | Admitting: *Deleted

## 2022-05-25 DIAGNOSIS — E785 Hyperlipidemia, unspecified: Secondary | ICD-10-CM

## 2022-05-25 DIAGNOSIS — I2510 Atherosclerotic heart disease of native coronary artery without angina pectoris: Secondary | ICD-10-CM

## 2022-05-26 ENCOUNTER — Ambulatory Visit: Payer: Medicare Other | Admitting: Occupational Therapy

## 2022-05-26 ENCOUNTER — Encounter: Payer: Self-pay | Admitting: Occupational Therapy

## 2022-05-26 DIAGNOSIS — M6281 Muscle weakness (generalized): Secondary | ICD-10-CM

## 2022-05-26 DIAGNOSIS — R262 Difficulty in walking, not elsewhere classified: Secondary | ICD-10-CM | POA: Diagnosis not present

## 2022-05-26 DIAGNOSIS — R296 Repeated falls: Secondary | ICD-10-CM | POA: Diagnosis not present

## 2022-05-26 DIAGNOSIS — M3320 Polymyositis, organ involvement unspecified: Secondary | ICD-10-CM | POA: Diagnosis not present

## 2022-05-26 DIAGNOSIS — M25511 Pain in right shoulder: Secondary | ICD-10-CM

## 2022-05-26 DIAGNOSIS — M25512 Pain in left shoulder: Secondary | ICD-10-CM | POA: Diagnosis not present

## 2022-05-26 DIAGNOSIS — R2689 Other abnormalities of gait and mobility: Secondary | ICD-10-CM | POA: Diagnosis not present

## 2022-05-26 DIAGNOSIS — R2681 Unsteadiness on feet: Secondary | ICD-10-CM | POA: Diagnosis not present

## 2022-05-26 DIAGNOSIS — R278 Other lack of coordination: Secondary | ICD-10-CM | POA: Diagnosis not present

## 2022-05-26 DIAGNOSIS — R293 Abnormal posture: Secondary | ICD-10-CM | POA: Diagnosis not present

## 2022-05-26 NOTE — Therapy (Signed)
OUTPATIENT OCCUPATIONAL THERAPY TREATMENT NOTE & PROGRESS NOTE   Patient Name: [name] MRN: [id number] DOB:12/02/1955, 66 y.o., male Today's Date: 05/26/2022  PCP: Alinda Deem, MD REFERRING PROVIDER: Charlton Amor, PA-C   OT End of Session - 05/26/22 1033     Visit Number 20    Number of Visits 26    Date for OT Re-Evaluation 06/11/22    Authorization Type BCBS Medicare    Authorization Time Period VL: [place]    OT Start Time 1016    OT Stop Time 1058    OT Time Calculation (min) 42 min    Activity Tolerance Patient tolerated treatment well    Behavior During Therapy [facility] for tasks assessed/performed            Occupational Therapy Progress Note  Dates of Reporting Period: 04/12/22 to 05/26/22  Objective Reports of Subjective Statement: Pt has been able to return to work part-time w/out difficulty  Objective Measurements: UE AROM, QuickDASH, MMT, levels of independence, grip strength   Goal Update: Pt has met 4/4 STGs and 4/7 LTGs; progressing well toward remaining goals  Plan: Continue to address core and BUE strength for improved safety and independence w/ functional activities   Reason Skilled Services are Required: Pt continues to experience proximal weakness and will benefit from continued skilled OT services to further address strength and coordination, ROM, and balance/functional mobility to improve participation and safety during ADLs, IADLs, and leisure pursuits w/out difficulty   Past Medical History:  Diagnosis Date   Aortic stenosis    mild AS 02/04/22   Arthritis    Diabetes mellitus    fasting 120s   History of kidney stones    Hypertension    Myocardial infarction (HCC)    stent placed   Peripheral neuropathy    left fifth finger, left toe   Pneumonia    hx of   Past Surgical History:  Procedure Laterality Date   APPENDECTOMY     CORONARY STENT INTERVENTION N/A 12/28/2021   Procedure: CORONARY STENT INTERVENTION;  Surgeon: Corky Crafts, MD;  Location: [facility];  Service: Cardiovascular;  Laterality: N/A;   CYSTOSCOPY W/ URETERAL STENT PLACEMENT Bilateral 03/17/2014   Procedure: CYSTOSCOPY WITH RIGHT STENT PULL  ;  Surgeon: Sebastian Ache, MD;  Location: [facility];  Service: Urology;  Laterality: Bilateral;   CYSTOSCOPY WITH RETROGRADE PYELOGRAM, URETEROSCOPY AND STENT PLACEMENT Bilateral 02/01/2014   Procedure: CYSTOSCOPY WITH BILATERAL RETROGRADE CYSTOSCOPY, BILATERAL RETROGRADE, PYELOGRAM, RIGHT URETEROSCOPY, STONE BASKETRY,  AND BILATERAL STENT PLACEMENT;  Surgeon: Sebastian Ache, MD;  Location: [facility];  Service: Urology;  Laterality: Bilateral;   CYSTOSCOPY WITH URETEROSCOPY AND STENT PLACEMENT Left 02/12/2021   Procedure: CYSTOSCOPY WITH LEFT URETEROSCOPY, BALLOON DILITATION, HOLMIUM LASER LITHOTRIPSY AND STENT PLACEMENT;  Surgeon: Crista Elliot, MD;  Location: [facility];  Service: Urology;  Laterality: Left;   HOLMIUM LASER APPLICATION Left 03/19/2014   Procedure: HOLMIUM LASER APPLICATION;  Surgeon: Sebastian Ache, MD;  Location: [facility];  Service: Urology;  Laterality: Left;   INTRAVASCULAR ULTRASOUND/IVUS N/A 12/28/2021   Procedure: Intravascular Ultrasound/IVUS;  Surgeon: Corky Crafts, MD;  Location: [facility] INV[facility] CV LAB;  Service: Cardiovascular;  Laterality: N/A;   LEFT HEART CATH AND CORONARY ANGIOGRAPHY N/A 12/28/2021   Procedure: LEFT HEART CATH AND CORONARY ANGIOGRAPHY;  Surgeon: Corky Crafts, MD;  Location: [facility] INV[facility]IVE CV LAB;  Service: Cardiovascular;  Laterality: N/A;   LITHOTRIPSY     MUSCLE BIOPSY Left 04/19/2022  Procedure: LEFT THIGH MUSCLE BIOPSY;  Surgeon: Erroll Luna, MD;  Location: [place];  Service: General;  Laterality: Left;   NEPHROLITHOTOMY Left 03/17/2014   Procedure: NEPHROLITHOTOMY PERCUTANEOUS with access;  Surgeon: Alexis Frock, MD;  Location: [facility];  Service: Urology;  Laterality: Left;   NEPHROLITHOTOMY Left 03/19/2014   Procedure: 2ND STAGE NEPHROLITHOTOMY  PERCUTANEOUS left ureteroscopy with lasr stone treatment and stent placement, with access;  Surgeon: Alexis Frock, MD;  Location: [facility];  Service: Urology;  Laterality: Left;   TOTAL KNEE ARTHROPLASTY Right 09/02/2014   Procedure: RIGHT TOTAL KNEE ARTHROPLASTY;  Surgeon: Meredith Pel, MD;  Location: [place];  Service: Orthopedics;  Laterality: Right;   uretral stent     Patient Active Problem List   Diagnosis Date Noted   Diabetic peripheral neuropathy associated with type 2 diabetes mellitus ([place]) 03/23/2022   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   Gait abnormality 03/07/2022   Cerebrovascular accident (CVA) ([place]) 03/07/2022   Myalgia due to statin 02/28/2022   Mild aortic stenosis 02/28/2022   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   Elevated CK 02/02/2022   Elevated LFTs 02/02/2022   CAD (coronary artery disease) 02/02/2022   Muscle weakness of all 4 extremities 02/02/2022   Hyperlipidemia 01/28/2022   Arthritis of knee 09/02/2014   Staghorn kidney stones 03/17/2014   Nephrolithiasis 02/01/2014   Pneumonia 08/27/2011   Diabetes mellitus with coincident hypertension ([place]) 08/27/2011   ONSET DATE: 12/27/21  REFERRING DIAG: M33.20 (ICD-10-CM) - Polymyositis, organ involvement unspecified   THERAPY DIAG:  Muscle weakness (generalized)  Right shoulder pain, unspecified chronicity  Left shoulder pain, unspecified chronicity  Other abnormalities of gait and mobility  Rationale for Evaluation and Treatment Rehabilitation  PERTINENT HISTORY: Suspected polymyositis; non-ST-elevation myocardial infarction (NSTEMI) s/p percutaneous coronary intervention (PCI) and drug-eluting stent (DES) placement 12/28/21; incidental small silent brainstem stroke (R pons infarct) identified per MRI; cervical spine neuroforaminal stenosis  R shoulder partial to near full-thickness supraspinatus tear; bilateral shoulder pain w/ R > L  Additional PMH: CAD, DMT2, HTN, dyslipidemia, nephrolithiasis, recent  transaminitis, h/o stage I prostate cancer, and R TKA in 2016  PRECAUTIONS: Fall; gradual return to driving  SUBJECTIVE:  SUBJECTIVE STATEMENT: Pt had a follow-up w/ Dr. Krista Blue that went well; reports she wants him to continue w/ PT for another year  PAIN: Are you having pain? 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 Able to walk 1300 ft w/out LOB; mild dyspnea on exertion  Ball Toss  Tossing/catching medball against inclined trampoline while standing w/ each UE 3x10; completed 1st set w/ 2.2 lb heavy med ball; 2nd/3rd sets w/ 4.4 lb medball  Low Level Lifting Retrieving 4.4 lb med ball from low level and tapping at around countertop height to simulate pulling medium-heavy weighted cookware from oven; addressed BUE and core strengthening/activation; 2 sets of 10 reps  Shoulder Flexion Closed-chain BUE shoulder flexion to chest height/90 deg, holding 5# dowel 3x10 w/ OT providing verbal cues to incorporate breath/release tension prn     *  Rest breaks taken between all exercises and between sets prn due to activity tolerance   PATIENT EDUCATION: Ongoing condition-specific education related to therapeutic interventions completed this session Person educated: Patient Education method: Explanation Education comprehension: verbalized  understanding  ------------------------------------------------------------------------------------------------------------------------------------------------------  UPPER EXTREMITY ROM     Active ROM Right Eval - 5/25  6/27  7/6  8/3  8/17  Shoulder flexion 51 48 52 73 128  Shoulder abduction 50 68 63 81 92  Shoulder extension 40      Shoulder IR 80      Shoulder ER 40    73  Elbow flexion 135      Elbow extension 41 3     (Blank rows = not tested)  Active ROM Left Eval - 5/25  6/27  7/6  8/3  8/17  Shoulder flexion 55 79 96 147 161  Shoulder abduction 58 87     Shoulder extension 47      Shoulder IR 84      Shoulder ER 35      Elbow flexion 138      Elbow extension 6 4     (Blank rows = not tested)  UPPER EXTREMITY MMT:     MMT Right 8/17 Left 8/17  Shoulder flexion 3/5 3+/5  Shoulder abduction 3/5 3+/5  Shoulder adduction 4-/5 4-/5  Shoulder extension 4-/5 4-/5  Shoulder internal rotation 4/5 4/5  Shoulder external rotation 4-/5 4-/4  Elbow flexion 4/5 4/5  Elbow extension 4/5 4/5  (Blank rows = not tested)  ------------------------------------------------------------------------------------------------------------------------------------------------------  HOME EXERCISE PROGRAM: MedBridge Code: [id number] - Supine Shoulder Flexion AAROM with Hands Clasped  - 2 x daily - 1 sets - 10 reps - 5 sec hold - Shoulder Flexion Wall Slide with Towel  - 2 x daily - 7 x weekly - 2 sets - 5 reps - Seated Shoulder Flexion with Dowel to 90  - 2 x daily - 2-3 sets - 10-15 reps - Seated Shoulder Row with Anchored Resistance  - 2 x daily - 2-3 sets - 10-15 reps - Seated Shoulder External Rotation with Dumbbells  - 2 x daily - 2-3 sets - 10-15 reps - Seated Upper Trapezius Stretch  - 2 x daily - 7 x weekly - 1 sets - 25 reps - Gentle Levator Scapulae Stretch  - 2 x daily - 7 x weekly - 1 sets - 25 reps  GOALS: Goals reviewed with patient? Yes  SHORT TERM GOALS:  Target date: 03/31/22  STG  Status:  1 Pt will verbalize understanding of at least 3 fall prevention and/or energy conservation strategies to increase safety w/ functional mobility at home Baseline: Decreased knowledge of strategies Met - 02/24/22  2 Pt will demonstrate independence w/ recommended DME and/or AE (e.g., long-handle sponge, dressing stick, etc.) to improve participation and safety w/ ADLs Baseline: Decreased knowledge of AE Met - 03/31/22  3 Pt will improve functional use of BUEs as evidence by being able to achieve at least 70 degrees of shoulder flexion w/out pain Baseline: RUE: 51 deg, LUE: 55 deg Met - 03/31/22:  RUE: 79 deg,  LUE: 96 deg  4 Pt will improve functional bilateral reach by increasing R elbow extension to at least 25 degrees Baseline: 41* of flexion when attempting extension Met - 03/22/22; WFL    LONG TERM GOALS: Target date: 06/11/22  LTG  Status:  1 Pt will demonstrate independence w/ full HEP designed for ROM and strength of BUEs Baseline: No HEP at  this time Met - 04/28/22  2 Pt will improve QuickDASH score by at least 19 points (40) to indicate mild functional impairment Baseline: 59.1 (moderate, borderline severe) impairment) Met - 04/07/22: 22.7  3 Pt will improve functional use of BUEs during grooming tasks (e.g., shampooing hair) as evidenced by improving strength of BUE shoulder flexion to at least 3+/5  Baseline: poor Progressing - 05/12/22: RUE 3/5, LUE 3+/5  4 With both hands, pt will be able to grip w/ at least 20 lbs of force to improve functional use of BUEs during IADLs Baseline: Unable to assess at eval due to medical restrictions Met - 04/07/22: RUE 56 lbs, LUE 73 lbs  5 Pt will be able to retrieve lightweight object (< 5 lbs) from overhead height w/out pain by d/c Baseline: Unable to reach overhead Met - 04/07/22  6 Pt will be able to retrieve medium-weight object (~5 lbs) from both low and overhead heights by d/c Baseline: Difficulty retrieving cast  iron skillet from oven Progressing  7 Pt will report being able to consistently complete bed mobility within acceptable amount of time w/out UE support Baseline: currently taking about 2 minutes; reports increased difficulty rolling/pushing up toward R side > L side Progressing    ASSESSMENT:  CLINICAL IMPRESSION: Treatment today continued focus on BUE strengthening and core stability for improved safety and efficiency w/ higher level IADL and leisure tasks. Pt demonstrates improvements, particularly w/ UB strength each session. OT incorporated more endurance this session, increasing sets and incorporating functional mobility as able w/ pt able to tolerate all exercises well. Pt continues to make good gains toward remaining functional goals. Will to address remaining functional goals and maximize functional use of BUEs in working toward returning to PLOF.  PERFORMANCE DEFICITS in functional skills including ADLs, IADLs, ROM, strength, pain, GMC, mobility, balance, body mechanics, endurance, cardiopulmonary status limiting function, decreased knowledge of precautions, decreased knowledge of use of DME, and UE functional use.   IMPAIRMENTS are limiting patient from ADLs, IADLs, rest and sleep, work, leisure, and social participation.   COMORBIDITIES has co-morbidities such as NSTEMI, CAD, and DM  that affects occupational performance. Patient will benefit from skilled OT to address above impairments and improve overall function.   PLAN: OT FREQUENCY: 2x/week (x1 in clinic; x1 in pool w/ aquatic OT)  OT DURATION: 10 weeks  PLANNED INTERVENTIONS: self care/ADL training, therapeutic exercise, therapeutic activity, manual therapy, passive range of motion, functional mobility training, aquatic therapy, moist heat, cryotherapy, patient/family education, energy conservation, DME and/or AE instructions, and Re-evaluation  RECOMMENDED OTHER SERVICES: Currently receiving PT services at this  location  CONSULTED AND AGREED WITH PLAN OF CARE: Patient  PLAN FOR NEXT SESSION: Continue to focus on core and BUE strength (R more impacted than L)   Kathrine Cords, MSOT, OTR/L 05/26/2022, 11:51 AM

## 2022-05-31 ENCOUNTER — Other Ambulatory Visit: Payer: Medicare Other

## 2022-06-03 ENCOUNTER — Other Ambulatory Visit: Payer: Medicare Other

## 2022-06-06 ENCOUNTER — Encounter: Payer: Self-pay | Admitting: Occupational Therapy

## 2022-06-06 ENCOUNTER — Ambulatory Visit: Payer: Medicare Other | Attending: Physician Assistant | Admitting: Occupational Therapy

## 2022-06-06 DIAGNOSIS — R293 Abnormal posture: Secondary | ICD-10-CM | POA: Insufficient documentation

## 2022-06-06 DIAGNOSIS — R278 Other lack of coordination: Secondary | ICD-10-CM | POA: Diagnosis not present

## 2022-06-06 DIAGNOSIS — M6281 Muscle weakness (generalized): Secondary | ICD-10-CM | POA: Diagnosis not present

## 2022-06-06 DIAGNOSIS — M3320 Polymyositis, organ involvement unspecified: Secondary | ICD-10-CM | POA: Insufficient documentation

## 2022-06-06 DIAGNOSIS — R262 Difficulty in walking, not elsewhere classified: Secondary | ICD-10-CM | POA: Diagnosis not present

## 2022-06-06 DIAGNOSIS — M25512 Pain in left shoulder: Secondary | ICD-10-CM | POA: Diagnosis not present

## 2022-06-06 DIAGNOSIS — M25511 Pain in right shoulder: Secondary | ICD-10-CM | POA: Diagnosis not present

## 2022-06-06 DIAGNOSIS — R296 Repeated falls: Secondary | ICD-10-CM | POA: Diagnosis not present

## 2022-06-06 DIAGNOSIS — R2681 Unsteadiness on feet: Secondary | ICD-10-CM | POA: Diagnosis not present

## 2022-06-06 DIAGNOSIS — R2689 Other abnormalities of gait and mobility: Secondary | ICD-10-CM | POA: Insufficient documentation

## 2022-06-06 NOTE — Therapy (Signed)
OUTPATIENT OCCUPATIONAL THERAPY TREATMENT NOTE & PROGRESS NOTE   Patient Name: [name] MRN: [id number] DOB:12-Apr-1956, 66 y.o., male Today's Date: 06/06/2022  PCP: Alinda Deem, MD REFERRING PROVIDER: Charlton Amor, PA-C   OT End of Session - 06/06/22 1823     Visit Number 21    Number of Visits 26    Date for OT Re-Evaluation 06/11/22    Authorization Type BCBS Medicare    Authorization Time Period VL: [place]    OT Start Time 1545    OT Stop Time 1620    OT Time Calculation (min) 35 min    Activity Tolerance Patient tolerated treatment well    Behavior During Therapy [facility] for tasks assessed/performed            Occupational Therapy Progress Note  Dates of Reporting Period: 04/12/22 to 05/26/22  Objective Reports of Subjective Statement: Pt has been able to return to work part-time w/out difficulty  Objective Measurements: UE AROM, QuickDASH, MMT, levels of independence, grip strength   Goal Update: Pt has met 4/4 STGs and 4/7 LTGs; progressing well toward remaining goals  Plan: Continue to address core and BUE strength for improved safety and independence w/ functional activities   Reason Skilled Services are Required: Pt continues to experience proximal weakness and will benefit from continued skilled OT services to further address strength and coordination, ROM, and balance/functional mobility to improve participation and safety during ADLs, IADLs, and leisure pursuits w/out difficulty   Past Medical History:  Diagnosis Date   Aortic stenosis    mild AS 02/04/22   Arthritis    Diabetes mellitus    fasting 120s   History of kidney stones    Hypertension    Myocardial infarction (HCC)    stent placed   Peripheral neuropathy    left fifth finger, left toe   Pneumonia    hx of   Past Surgical History:  Procedure Laterality Date   APPENDECTOMY     CORONARY STENT INTERVENTION N/A 12/28/2021   Procedure: CORONARY STENT INTERVENTION;  Surgeon: Corky Crafts, MD;  Location: [facility];  Service: Cardiovascular;  Laterality: N/A;   CYSTOSCOPY W/ URETERAL STENT PLACEMENT Bilateral 03/17/2014   Procedure: CYSTOSCOPY WITH RIGHT STENT PULL  ;  Surgeon: Sebastian Ache, MD;  Location: [facility];  Service: Urology;  Laterality: Bilateral;   CYSTOSCOPY WITH RETROGRADE PYELOGRAM, URETEROSCOPY AND STENT PLACEMENT Bilateral 02/01/2014   Procedure: CYSTOSCOPY WITH BILATERAL RETROGRADE CYSTOSCOPY, BILATERAL RETROGRADE, PYELOGRAM, RIGHT URETEROSCOPY, STONE BASKETRY,  AND BILATERAL STENT PLACEMENT;  Surgeon: Sebastian Ache, MD;  Location: [facility];  Service: Urology;  Laterality: Bilateral;   CYSTOSCOPY WITH URETEROSCOPY AND STENT PLACEMENT Left 02/12/2021   Procedure: CYSTOSCOPY WITH LEFT URETEROSCOPY, BALLOON DILITATION, HOLMIUM LASER LITHOTRIPSY AND STENT PLACEMENT;  Surgeon: Crista Elliot, MD;  Location: [facility];  Service: Urology;  Laterality: Left;   HOLMIUM LASER APPLICATION Left 03/19/2014   Procedure: HOLMIUM LASER APPLICATION;  Surgeon: Sebastian Ache, MD;  Location: [facility];  Service: Urology;  Laterality: Left;   INTRAVASCULAR ULTRASOUND/IVUS N/A 12/28/2021   Procedure: Intravascular Ultrasound/IVUS;  Surgeon: Corky Crafts, MD;  Location: [facility] IN[facility] CV LAB;  Service: Cardiovascular;  Laterality: N/A;   LEFT HEART CATH AND CORONARY ANGIOGRAPHY N/A 12/28/2021   Procedure: LEFT HEART CATH AND CORONARY ANGIOGRAPHY;  Surgeon: Corky Crafts, MD;  Location: [facility] INVASIVE CV LAB;  Service: Cardiovascular;  Laterality: N/A;   LITHOTRIPSY     MUSCLE BIOPSY Left 04/19/2022  Procedure: LEFT THIGH MUSCLE BIOPSY;  Surgeon: Erroll Luna, MD;  Location: [place];  Service: General;  Laterality: Left;   NEPHROLITHOTOMY Left 03/17/2014   Procedure: NEPHROLITHOTOMY PERCUTANEOUS with access;  Surgeon: Alexis Frock, MD;  Location: [facility];  Service: Urology;  Laterality: Left;   NEPHROLITHOTOMY Left 03/19/2014   Procedure: 2ND STAGE NEPHROLITHOTOMY  PERCUTANEOUS left ureteroscopy with lasr stone treatment and stent placement, with access;  Surgeon: Alexis Frock, MD;  Location: [facility];  Service: Urology;  Laterality: Left;   TOTAL KNEE ARTHROPLASTY Right 09/02/2014   Procedure: RIGHT TOTAL KNEE ARTHROPLASTY;  Surgeon: Meredith Pel, MD;  Location: [place];  Service: Orthopedics;  Laterality: Right;   uretral stent     Patient Active Problem List   Diagnosis Date Noted   Diabetic peripheral neuropathy associated with type 2 diabetes mellitus ([place]) 03/23/2022   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   Gait abnormality 03/07/2022   Cerebrovascular accident (CVA) ([place]) 03/07/2022   Myalgia due to statin 02/28/2022   Mild aortic stenosis 02/28/2022   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   Elevated CK 02/02/2022   Elevated LFTs 02/02/2022   CAD (coronary artery disease) 02/02/2022   Muscle weakness of all 4 extremities 02/02/2022   Hyperlipidemia 01/28/2022   Arthritis of knee 09/02/2014   Staghorn kidney stones 03/17/2014   Nephrolithiasis 02/01/2014   Pneumonia 08/27/2011   Diabetes mellitus with coincident hypertension ([place]) 08/27/2011   ONSET DATE: 12/27/21  REFERRING DIAG: M33.20 (ICD-10-CM) - Polymyositis, organ involvement unspecified   THERAPY DIAG:  Muscle weakness (generalized)  Right shoulder pain, unspecified chronicity  Left shoulder pain, unspecified chronicity  Other lack of coordination  Unsteadiness on feet  Abnormal posture  Rationale for Evaluation and Treatment Rehabilitation  PERTINENT HISTORY: Suspected polymyositis; non-ST-elevation myocardial infarction (NSTEMI) s/p percutaneous coronary intervention (PCI) and drug-eluting stent (DES) placement 12/28/21; incidental small silent brainstem stroke (R pons infarct) identified per MRI; cervical spine neuroforaminal stenosis  R shoulder partial to near full-thickness supraspinatus tear; bilateral shoulder pain w/ R > L  Additional PMH: CAD, DMT2, HTN, dyslipidemia,  nephrolithiasis, recent transaminitis, h/o stage I prostate cancer, and R TKA in 2016  PRECAUTIONS: Fall; gradual return to driving  SUBJECTIVE:  SUBJECTIVE STATEMENT: Pt had a follow-up w/ Dr. Krista Blue that went well; reports she wants him to continue w/ PT for another year  PAIN: Are you having pain? 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  Patient reports he is doing great overall and notes several functional improvements allowing him to travel, attend concerts, etc.   Patient met a personal goal today to swim for at least 4 min.  Patient swam continuously for 4+ minutes with minimal SOB.  2 min rest for full recovery.  Patient did not have gross motor coordination nor stamina to complete even one minute at start of aquatic therapy.   Worked on strengthening BUE - patient upgraded to medium versus light barbells.  Patient also with improved core activation for upright bicycle exercise in 4.5 ft of water.   Patient agreeable to d/c Aquatic OT at this time.     PATIENT EDUCATION: Ongoing condition-specific education related to therapeutic interventions completed this session Person educated: Patient Education method:  Explanation Education comprehension: verbalized understanding  ------------------------------------------------------------------------------------------------------------------------------------------------------  UPPER EXTREMITY ROM     Active ROM Right Eval - 5/25  6/27  7/6  8/3  8/17  Shoulder flexion 51 48 52 73 128  Shoulder abduction 50 68 63 81 92  Shoulder extension 40      Shoulder IR 80       Shoulder ER 40    73  Elbow flexion 135      Elbow extension 41 3     (Blank rows = not tested)  Active ROM Left Eval - 5/25  6/27  7/6  8/3  8/17  Shoulder flexion 55 79 96 147 161  Shoulder abduction 58 87     Shoulder extension 47      Shoulder IR 84      Shoulder ER 35      Elbow flexion 138      Elbow extension 6 4     (Blank rows = not tested)  UPPER EXTREMITY MMT:     MMT Right 8/17 Left 8/17  Shoulder flexion 3/5 3+/5  Shoulder abduction 3/5 3+/5  Shoulder adduction 4-/5 4-/5  Shoulder extension 4-/5 4-/5  Shoulder internal rotation 4/5 4/5  Shoulder external rotation 4-/5 4-/4  Elbow flexion 4/5 4/5  Elbow extension 4/5 4/5  (Blank rows = not tested)  ------------------------------------------------------------------------------------------------------------------------------------------------------  HOME EXERCISE PROGRAM: MedBridge Code: [id number] - Supine Shoulder Flexion AAROM with Hands Clasped  - 2 x daily - 1 sets - 10 reps - 5 sec hold - Shoulder Flexion Wall Slide with Towel  - 2 x daily - 7 x weekly - 2 sets - 5 reps - Seated Shoulder Flexion with Dowel to 90  - 2 x daily - 2-3 sets - 10-15 reps - Seated Shoulder Row with Anchored Resistance  - 2 x daily - 2-3 sets - 10-15 reps - Seated Shoulder External Rotation with Dumbbells  - 2 x daily - 2-3 sets - 10-15 reps - Seated Upper Trapezius Stretch  - 2 x daily - 7 x weekly - 1 sets - 25 reps - Gentle Levator Scapulae Stretch  - 2 x daily - 7 x weekly - 1 sets - 25 reps  GOALS: Goals reviewed with patient? Yes  SHORT TERM GOALS: Target date: 03/31/22  STG  Status:  1 Pt will verbalize understanding of at least 3 fall prevention and/or energy conservation strategies to increase safety w/ functional mobility at home Baseline: Decreased knowledge of strategies Met - 02/24/22  2 Pt will demonstrate independence w/ recommended DME and/or AE (e.g., long-handle sponge, dressing stick, etc.) to improve  participation and safety w/ ADLs Baseline: Decreased knowledge of AE Met - 03/31/22  3 Pt will improve functional use of BUEs as evidence by being able to achieve at least 70 degrees of shoulder flexion w/out pain Baseline: RUE: 51 deg, LUE: 55 deg Met - 03/31/22:  RUE: 79 deg,  LUE: 96 deg  4 Pt will improve functional bilateral reach by increasing R elbow extension to at least 25 degrees Baseline: 41* of flexion when attempting extension Met - 03/22/22; WFL    LONG TERM GOALS: Target date: 06/11/22  LTG  Status:  1 Pt will demonstrate independence w/ full HEP designed for ROM and strength of BUEs Baseline: No HEP at this time Met - 04/28/22  2 Pt will improve QuickDASH score by at least 19 points (40) to indicate mild functional impairment Baseline: 59.1 (moderate, borderline severe) impairment) Met - 04/07/22: 22.7  3 Pt will improve functional use of BUEs during grooming tasks (e.g., shampooing hair) as evidenced by improving strength of BUE shoulder flexion to at least 3+/5  Baseline: poor Progressing - 05/12/22: RUE 3/5, LUE 3+/5  4 With both hands, pt will be able to grip w/ at least 20 lbs of force to improve functional use of BUEs during IADLs Baseline: Unable to assess at eval due to medical restrictions Met - 04/07/22: RUE 56 lbs, LUE 73 lbs  5 Pt will be able to retrieve lightweight object (< 5 lbs) from overhead height w/out pain by d/c Baseline: Unable to reach overhead Met - 04/07/22  6 Pt will be able to retrieve medium-weight object (~5 lbs) from both low and overhead heights by d/c Baseline: Difficulty retrieving cast iron skillet from oven Progressing  7 Pt will report being able to consistently complete bed mobility within acceptable amount of time w/out UE support Baseline: currently taking about 2 minutes; reports increased difficulty rolling/pushing up toward R side > L side Progressing    ASSESSMENT:  CLINICAL IMPRESSION: Patient has shown significant improvement in  balance, coordiantion, activity tolerance and general UE and core strength such that he is ready to discontinue aquatic therapy at this time.  Patient is very pleased with his progress - and reports he can now get in and out of bed without help!  PERFORMANCE DEFICITS in functional skills including ADLs, IADLs, ROM, strength, pain, GMC, mobility, balance, body mechanics, endurance, cardiopulmonary status limiting function, decreased knowledge of precautions, decreased knowledge of use of DME, and UE functional use.   IMPAIRMENTS are limiting patient from ADLs, IADLs, rest and sleep, work, leisure, and social participation.   COMORBIDITIES has co-morbidities such as NSTEMI, CAD, and DM  that affects occupational performance. Patient will benefit from skilled OT to address above impairments and improve overall function.   PLAN: OT FREQUENCY: 2x/week (x1 in clinic; x1 in pool w/ aquatic OT)  OT DURATION: 10 weeks  PLANNED INTERVENTIONS: self care/ADL training, therapeutic exercise, therapeutic activity, manual therapy, passive range of motion, functional mobility training, aquatic therapy, moist heat, cryotherapy, patient/family education, energy conservation, DME and/or AE instructions, and Re-evaluation  RECOMMENDED OTHER SERVICES: Currently receiving PT services at this location  CONSULTED AND AGREED WITH PLAN OF CARE: Patient  PLAN FOR NEXT SESSION: Continue to focus on core and BUE strength (R more impacted than L)   Kathrine Cords, MSOT, OTR/L 06/06/2022, 6:24 PM

## 2022-06-06 NOTE — Therapy (Signed)
OUTPATIENT PHYSICAL THERAPY LOWER EXTREMITY TREATMENT   Patient Name: [name] MRN: [id number] DOB:05/24/1956, 66 y.o., male Today's Date: 06/06/2022              Past Medical History:  Diagnosis Date   Aortic stenosis    mild AS 02/04/22   Arthritis    Diabetes mellitus    fasting 120s   History of kidney stones    Hypertension    Myocardial infarction ([place])    stent placed   Peripheral neuropathy    left fifth finger, left toe   Pneumonia    hx of   Past Surgical History:  Procedure Laterality Date   APPENDECTOMY     CORONARY STENT INTERVENTION N/A 12/28/2021   Procedure: CORONARY STENT INTERVENTION;  Surgeon: Rodney Booze, MD;  Location: [facility];  Service: Cardiovascular;  Laterality: N/A;   CYSTOSCOPY W/ URETERAL STENT PLACEMENT Bilateral 03/17/2014   Procedure: CYSTOSCOPY WITH Gutierrez STENT PULL  ;  Surgeon: Rodney Frock, MD;  [facility];  Service: Urology;  Laterality: Bilateral;   CYSTOSCOPY WITH RETROGRADE PYELOGRAM, URETEROSCOPY AND STENT PLACEMENT Bilateral 02/01/2014   Procedure: CYSTOSCOPY WITH BILATERAL RETROGRADE CYSTOSCOPY, BILATERAL RETROGRADE, PYELOGRAM, Gutierrez URETEROSCOPY, STONE BASKETRY,  AND BILATERAL STENT PLACEMENT;  Surgeon: Rodney Frock, MD;  [facility];  Service: Urology;  Laterality: Bilateral;   CYSTOSCOPY WITH URETEROSCOPY AND STENT PLACEMENT Left 02/12/2021   Procedure: CYSTOSCOPY WITH LEFT URETEROSCOPY, BALLOON DILITATION, HOLMIUM LASER LITHOTRIPSY AND STENT PLACEMENT;  Surgeon: Rodney Mallow, MD;  [facility];  Service: Urology;  Laterality: Left;   HOLMIUM LASER APPLICATION Left 03/17/2978   Procedure: HOLMIUM LASER APPLICATION;  Surgeon: Rodney Frock, MD;  [facility];  Service: Urology;  Laterality: Left;   INTRAVASCULAR ULTRASOUND/IVUS N/A 12/28/2021   Procedure: Intravascular Ultrasound/IVUS;  Surgeon: Rodney Booze, MD;  Location: [facility];  Service: Cardiovascular;   Laterality: N/A;   LEFT HEART CATH AND CORONARY ANGIOGRAPHY N/A 12/28/2021   Procedure: LEFT HEART CATH AND CORONARY ANGIOGRAPHY;  Surgeon: Rodney Booze, MD;  Location: [facility];  Service: Cardiovascular;  Laterality: N/A;   LITHOTRIPSY     MUSCLE BIOPSY Left 04/19/2022   Procedure: LEFT THIGH MUSCLE BIOPSY;  Surgeon: Rodney Luna, MD;  Location: [facility];  Service: General;  Laterality: Left;   NEPHROLITHOTOMY Left 03/17/2014   Procedure: NEPHROLITHOTOMY PERCUTANEOUS with access;  Surgeon: Rodney Frock, MD;  [facility];  Service: Urology;  Laterality: Left;   NEPHROLITHOTOMY Left 03/19/2014   Procedure: 2ND STAGE NEPHROLITHOTOMY PERCUTANEOUS left ureteroscopy with lasr stone treatment and stent placement, with access;  Surgeon: Rodney Frock, MD;  [facility];  Service: Urology;  Laterality: Left;   TOTAL KNEE ARTHROPLASTY Gutierrez 09/02/2014   Procedure: Gutierrez TOTAL KNEE ARTHROPLASTY;  Surgeon: Rodney Pel, MD;  Location: [facility];  Service: Orthopedics;  Laterality: Gutierrez;   uretral stent     Patient Active Problem List   Diagnosis Date Noted   Diabetic peripheral neuropathy associated with type 2 diabetes mellitus (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   Gait abnormality 03/07/2022   Cerebrovascular accident (CVA) ([place]) 03/07/2022   Myalgia due to statin 02/28/2022   Mild aortic stenosis 02/28/2022   Polymyositis ([place]) 02/04/2022   Elevated CK 02/02/2022   Elevated LFTs 02/02/2022   CAD (coronary artery disease) 02/02/2022   Muscle weakness of all 4 extremities 02/02/2022   Hyperlipidemia 01/28/2022   Arthritis of knee 09/02/2014   Staghorn  kidney stones 03/17/2014   Nephrolithiasis 02/01/2014   Pneumonia 08/27/2011   Diabetes mellitus with coincident hypertension ([place]) 08/27/2011    PCP: Rodney Right, MD  REFERRING PROVIDER: Cathlyn Parsons, PA-C   REFERRING DIAG: M33.20 (ICD-10-CM) - Polymyositis, organ involvement unspecified    THERAPY DIAG:  No diagnosis found.  Rationale for Evaluation and Treatment Rehabilitation  ONSET DATE: 02/15/2022   SUBJECTIVE:   SUBJECTIVE STATEMENT: Doing pretty good, having some trouble with my sciatic nerve today.   PERTINENT HISTORY: [name] is a 66 y.o. Gutierrez-handed male with history of type 2 diabetes mellitus hypertension intolerance of statin due to abnormal LFTs (off for the past years), CAD status post DES to RCA 12/28/2021 (addition of arterial for statin 20 mg, aspirin/Brilinta),   PAIN:  Are you having pain? No  PRECAUTIONS: None  WEIGHT BEARING RESTRICTIONS No  FALLS:  Has patient fallen in last 6 months? Yes. Number of falls 5  LIVING ENVIRONMENT: Lives with: lives with their family and lives alone Lives in: House/apartment Stairs: No Has following equipment at home: Gilford Rile - 4 wheeled, shower chair, Grab bars, and None  OCCUPATION: Comptroller, office work, Government social research officer.  PLOF: Independent  PATIENT GOALS Increase strength, return to his active lifestyle. 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 Travels to attend concerts.   OBJECTIVE:   MUSCLE LENGTH: Hamstrings: Gutierrez 62 deg; Left 68 deg   POSTURE: rounded shoulders   LOWER EXTREMITY ROM: WFL throughout, R hip with mild     tightness.   LOWER EXTREMITY MMT:  MMT Gutierrez eval Left eval Gutierrez 03/31/22 Left 03/31/22  Hip flexion 4- 3+ 4- 4-  Hip extension 4- 3+ 4- 3+  Hip abduction 4- 3+ 4- 3+  Hip adduction      Hip internal rotation      Hip external rotation      Knee flexion 4 4- 4 4  Knee extension 4 4- 4 4  Ankle dorsiflexion 4 4    Ankle plantarflexion      Ankle inversion      Ankle eversion       (Blank rows = not tested)   FUNCTIONAL TESTS:        5x STS18.22 BERG 43/56   TODAY'S TREATMENT: 06/07/22 Nustep Resisted gait Step ups anterior and lateral  Walking on beam  Step up on BOSU  Leg ext HS curls  Leg  press   05/19/22 Reassess  5xSTS- 14.20s BERG-47/56  Walking big loop outside Nustep L4 x60mins  Step up on airex x10 each side  Side steps on airex  Leg press 50# 2x10  Calf raises on leg press 40# 2x10  Pallof press 15# 2x10 each side 2 way hip 5# x10    05/17/22 Nustep L5 x8mins  Resisted gait 30# 4 way x 4  Leg ext 25# 3x10 HS curls 35# 3x10  Shoulder ext 5# 2x10 Cable rows 10# 2x10  STS with OHP on airex 2x10 Bicep curls 15# 2x10 Tricep ext 20# 2x10 Calf raises on black bar 2x12    05/12/22 Bike L5x4mins  Step ups 6" forwards and lateral  Leg press 40# x10, 50# x10  5xSTS- 39.85s Walking on balance beam  Standing on airex hitting ball Marching on airex 5# AW Calf raises on airex 5# AW  OHP yellow ball on airex 2x10  05/10/22 Nustep L5 x52mins  Walk outside 1 big loop Resisted gait 20# 4x4 way Pallof press 10# 2x10 Leg ext  25# 3x10 HS curls 25# 3x10 Seated rows 25# 2x10 Lat pull downs 20# 2x10    05/04/22 Bike L5 x20mins  Lateral band walks greenTB  Step ups 6" 2x10 bilat Standing marches 5# 2x10  Step over half foam 5# 2x10 Walking on beam forwards and side steps   05/03/22 Bike L4 x68mins  Seated rows 20# 2x10  Lat pull down 15# 2x10  Leg press 50# 2x10, x10 RLE eccentric  Calf raises on black bar 2x12  Shoulder ER 5# 2x10 2 way hip 5# abd/ext    04/27/22 STS with OHP red ball 2x10 Crunches on slant 2x10 Standing twists with greenTB 2x10  Knee flex 25# 2x15 Knee ext 20# x15, 25# x15 Shoulder ext 5# 2x10 Resisted gait 20# 4 way x4  04/14/22 Nustep L5 x34mins Knee ext 20# 3x10  HS curls 15# 3x10 Pallof press 5# 2x10  Calf raises on airex 2x12 BlackTB crunches 2x12  Leg press 40# 3x10   04/07/22 Bike L5 x13mins Leg press 40# 2x10  Resisted gait 15# 5x forwards and sidesteps Hip ext 5# 2x10 each side  Crunches w/blackTB 2x15 Back ext black TB 2x15 Gastroc stretch on slant 54min   04/05/22 Nustep L5 x80mins Bike L4 x5 mins  Walking up  and down incline outside x2 Calf raises 2x10 Knee ext 20# 3x10 Knee flex 15# 3x10 Step ups 6" 2x10   03/30/22 Nustep L5 x64mins 5xSTS (unable to complete 5, stopped at 4 in 20s)  BERG (44/56) Walking 10 laps w/o cane  Cybex flex 15# 2x15 Cybex ext 20# 2x12  03/22/22 Nustep L4 x75mins STS on Airex 2x10 Walking outside on grass Walking down and up hill  Cybex knee flex 25# 2x10 Cybex knee ext 25# 2x10  03/17/22 Bike L4, 99mins Resisted gait 15# forwards and sideways x5 Hip abd and ext 3# 2x10 Side steps on foam Step overs 4" Walking without cane 3 laps   03/15/22 Nustep L5, 61mins UBE forwards/back 6 mins STS elevated table holding red ball 2x10 Crunches with black band 3x10 Step ups forwards and sideways 20reps 4" Standing on foam feet together catch Tandem on foam 30s    03/10/22 Nustep L5, 47mins  Shoulder flexion w/dowel 2x10  Bicep curls 4# 2x10 Walking without cane 3 laps CGA Step ups on foam CGA, 2 LOB  Standing on foam playing catch minA  2 way hip ext/abd 2x10       PATIENT EDUCATION:  Education details: POC Person educated: Patient Education method: Explanation, Corporate treasurer cues, and Verbal cues Education comprehension: verbalized understanding, returned demonstration, verbal cues required, and tactile cues required    Patent educated about keeping lights on during the night for safety and to prevent future falls. Also educated about collection of HEP, and to not do all exercises but rather pick a few to do in order to avoid fatigue.  HOME EXERCISE PROGRAM: [id number]  ASSESSMENT:  CLINICAL IMPRESSION: 20th visit progress note. Patient has shown good improvements towards remaining LTGs. Improved scores for 5xSTS and BERG. He is able to walk the big loop outside wit ha faster and safer pace. CGA needed with step up on airex due to R foot catching, does better with side steps. minA needed LOB/stumble on airex.    OBJECTIVE IMPAIRMENTS Abnormal gait,  decreased activity tolerance, decreased balance, decreased coordination, decreased endurance, decreased mobility, difficulty walking, decreased strength, improper body mechanics, and postural dysfunction.   ACTIVITY LIMITATIONS carrying, lifting, bending, standing, squatting, stairs, transfers, bed mobility, and locomotion level  PARTICIPATION LIMITATIONS: meal prep, cleaning, laundry, driving, shopping, community activity, occupation, and yard work  PERSONAL FACTORS Past/current experiences are also affecting patient's functional outcome.   REHAB POTENTIAL: Good  CLINICAL DECISION MAKING: Evolving/moderate complexity  EVALUATION COMPLEXITY: Moderate   GOALS: Goals reviewed with patient? Yes  SHORT TERM GOALS: Target date: 03/17/2022   I with basic HEP Baseline: Goal status: MET    LONG TERM GOALS: Target date: 06/12/2022   I with final HEP Baseline:  Goal status: INITIAL  2.  Patient will score at least 50/56 on BERG Balance Exam Baseline: 43, 03/31/22 (44), 43/56-8/15, 47/56-8/24 Goal status: IN PROGRESS  3.  Paitent will complete 5x sit to stand in < 12 sec to demonstrate improved strength in BLE. Baseline: 39.85s-8/17, 14.20s-8/24 Goal status: IN PROGRESS  4.  Patient will ambulate at least 1000' with LRAD, MI, on level and unlevel surfaces Baseline: 03/31/22 able to walk 1000' w/o AD on even surfaces, some fatigue towards end, SBA Goal status: MET  5.  Patient will be able to manage cooking and cleaning activities in his home without falling and without requiring a rest break. Baseline:  Goal status: MET   PLAN: PT FREQUENCY: 2x/week  PT DURATION: 12 weeks  PLANNED INTERVENTIONS: Therapeutic exercises, Therapeutic activity, Neuromuscular re-education, Balance training, Gait training, Patient/Family education, Joint mobilization, Stair training, and Manual therapy  PLAN FOR NEXT SESSION: gait and balance activities, core strengthening   Andris Baumann,  DPT 06/06/2022, 2:19 PM

## 2022-06-07 ENCOUNTER — Ambulatory Visit: Payer: Medicare Other

## 2022-06-07 DIAGNOSIS — M3320 Polymyositis, organ involvement unspecified: Secondary | ICD-10-CM

## 2022-06-07 DIAGNOSIS — R262 Difficulty in walking, not elsewhere classified: Secondary | ICD-10-CM

## 2022-06-07 DIAGNOSIS — M6281 Muscle weakness (generalized): Secondary | ICD-10-CM | POA: Diagnosis not present

## 2022-06-07 DIAGNOSIS — R2681 Unsteadiness on feet: Secondary | ICD-10-CM

## 2022-06-07 DIAGNOSIS — R296 Repeated falls: Secondary | ICD-10-CM

## 2022-06-07 DIAGNOSIS — R278 Other lack of coordination: Secondary | ICD-10-CM

## 2022-06-07 DIAGNOSIS — M25512 Pain in left shoulder: Secondary | ICD-10-CM | POA: Diagnosis not present

## 2022-06-07 DIAGNOSIS — R2689 Other abnormalities of gait and mobility: Secondary | ICD-10-CM | POA: Diagnosis not present

## 2022-06-07 DIAGNOSIS — R293 Abnormal posture: Secondary | ICD-10-CM | POA: Diagnosis not present

## 2022-06-07 DIAGNOSIS — M25511 Pain in right shoulder: Secondary | ICD-10-CM | POA: Diagnosis not present

## 2022-06-08 NOTE — Therapy (Signed)
OUTPATIENT PHYSICAL THERAPY LOWER EXTREMITY TREATMENT   Patient Name: [name] MRN: [id number] DOB:1955-10-15, 66 y.o., male Today's Date: 06/09/2022   PT End of Session - 06/09/22 1014     Visit Number 22    Date for PT Re-Evaluation 06/13/22    PT Start Time 1015    PT Stop Time 1057    PT Time Calculation (min) 42 min    Activity Tolerance Patient limited by fatigue;Patient tolerated treatment well    Behavior During Therapy [facility] for tasks assessed/performed                       Past Medical History:  Diagnosis Date   Aortic stenosis    mild AS 02/04/22   Arthritis    Diabetes mellitus    fasting 120s   History of kidney stones    Hypertension    Myocardial infarction ([place])    stent placed   Peripheral neuropathy    left fifth finger, left toe   Pneumonia    hx of   Past Surgical History:  Procedure Laterality Date   APPENDECTOMY     CORONARY STENT INTERVENTION N/A 12/28/2021   Procedure: CORONARY STENT INTERVENTION;  Surgeon: Jettie Booze, MD;  Location: [facility];  Service: Cardiovascular;  Laterality: N/A;   CYSTOSCOPY W/ URETERAL STENT PLACEMENT Bilateral 03/17/2014   Procedure: CYSTOSCOPY WITH RIGHT STENT PULL  ;  Surgeon: Alexis Frock, MD;  [facility];  Service: Urology;  Laterality: Bilateral;   CYSTOSCOPY WITH RETROGRADE PYELOGRAM, URETEROSCOPY AND STENT PLACEMENT Bilateral 02/01/2014   Procedure: CYSTOSCOPY WITH BILATERAL RETROGRADE CYSTOSCOPY, BILATERAL RETROGRADE, PYELOGRAM, RIGHT URETEROSCOPY, STONE BASKETRY,  AND BILATERAL STENT PLACEMENT;  Surgeon: Alexis Frock, MD;  [facility];  Service: Urology;  Laterality: Bilateral;   CYSTOSCOPY WITH URETEROSCOPY AND STENT PLACEMENT Left 02/12/2021   Procedure: CYSTOSCOPY WITH LEFT URETEROSCOPY, BALLOON DILITATION, HOLMIUM LASER LITHOTRIPSY AND STENT PLACEMENT;  Surgeon: Lucas Mallow, MD;  [facility];  Service: Urology;  Laterality: Left;   HOLMIUM LASER  APPLICATION Left 10/23/7865   Procedure: HOLMIUM LASER APPLICATION;  Surgeon: Alexis Frock, MD;  [facility];  Service: Urology;  Laterality: Left;   INTRAVASCULAR ULTRASOUND/IVUS N/A 12/28/2021   Procedure: Intravascular Ultrasound/IVUS;  Surgeon: Jettie Booze, MD;  Location: [facility];  Service: Cardiovascular;  Laterality: N/A;   LEFT HEART CATH AND CORONARY ANGIOGRAPHY N/A 12/28/2021   Procedure: LEFT HEART CATH AND CORONARY ANGIOGRAPHY;  Surgeon: Jettie Booze, MD;  Location: [facility];  Service: Cardiovascular;  Laterality: N/A;   LITHOTRIPSY     MUSCLE BIOPSY Left 04/19/2022   Procedure: LEFT THIGH MUSCLE BIOPSY;  Surgeon: Erroll Luna, MD;  Location: [facility];  Service: General;  Laterality: Left;   NEPHROLITHOTOMY Left 03/17/2014   Procedure: NEPHROLITHOTOMY PERCUTANEOUS with access;  Surgeon: Alexis Frock, MD;  [facility];  Service: Urology;  Laterality: Left;   NEPHROLITHOTOMY Left 03/19/2014   Procedure: 2ND STAGE NEPHROLITHOTOMY PERCUTANEOUS left ureteroscopy with lasr stone treatment and stent placement, with access;  Surgeon: Alexis Frock, MD;  [facility];  Service: Urology;  Laterality: Left;   TOTAL KNEE ARTHROPLASTY Right 09/02/2014   Procedure: RIGHT TOTAL KNEE ARTHROPLASTY;  Surgeon: Meredith Pel, MD;  Location: [facility];  Service: Orthopedics;  Laterality: Right;   uretral stent     Patient Active Problem List   Diagnosis Date Noted   Diabetic peripheral neuropathy associated with type 2  diabetes mellitus ([place]) 03/23/2022   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   Gait abnormality 03/07/2022   Cerebrovascular accident (CVA) ([place]) 03/07/2022   Myalgia due to statin 02/28/2022   Mild aortic stenosis 02/28/2022   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   Elevated CK 02/02/2022   Elevated LFTs 02/02/2022   CAD (coronary artery disease) 02/02/2022   Muscle weakness of all 4 extremities 02/02/2022   Hyperlipidemia 01/28/2022   Arthritis  of knee 09/02/2014   Staghorn kidney stones 03/17/2014   Nephrolithiasis 02/01/2014   Pneumonia 08/27/2011   Diabetes mellitus with coincident hypertension ([place]) 08/27/2011    PCP: Greig Right, MD  REFERRING PROVIDER: Cathlyn Parsons, PA-C   REFERRING DIAG: M33.20 (ICD-10-CM) - Polymyositis, organ involvement unspecified   THERAPY DIAG:  Muscle weakness (generalized)  Unsteadiness on feet  Other abnormalities of gait and mobility  Abnormal posture  Polymyositis ([place])  Other lack of coordination  Difficulty in walking, not elsewhere classified  Rationale for Evaluation and Treatment Rehabilitation  ONSET DATE: 02/15/2022   SUBJECTIVE:   SUBJECTIVE STATEMENT: I am doing good, I was discharged from OT and aquatic therapy. The hardest thing for me is my stamina.   PERTINENT HISTORY: [name] is a 67 y.o. right-handed male with history of type 2 diabetes mellitus hypertension intolerance of statin due to abnormal LFTs (off for the past years), CAD status post DES to RCA 12/28/2021 (addition of arterial for statin 20 mg, aspirin/Brilinta),   PAIN:  Are you having pain? No  PRECAUTIONS: None  WEIGHT BEARING RESTRICTIONS No  FALLS:  Has patient fallen in last 6 months? Yes. Number of falls 5  LIVING ENVIRONMENT: Lives with: lives with their family and lives alone Lives in: House/apartment Stairs: No Has following equipment at home: Gilford Rile - 4 wheeled, shower chair, Grab bars, and None  OCCUPATION: Comptroller, office work, Government social research officer.  PLOF: Independent  PATIENT GOALS Increase strength, return to his active lifestyle. 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 Travels to attend concerts.   OBJECTIVE:   MUSCLE LENGTH: Hamstrings: Right 62 deg; Left 68 deg   POSTURE: rounded shoulders   LOWER EXTREMITY ROM: WFL throughout, R hip with mild     tightness.   LOWER EXTREMITY MMT:  MMT Right eval Left eval  Right 03/31/22 Left 03/31/22  Hip flexion 4- 3+ 4- 4-  Hip extension 4- 3+ 4- 3+  Hip abduction 4- 3+ 4- 3+  Hip adduction      Hip internal rotation      Hip external rotation      Knee flexion 4 4- 4 4  Knee extension 4 4- 4 4  Ankle dorsiflexion 4 4    Ankle plantarflexion      Ankle inversion      Ankle eversion       (Blank rows = not tested)   FUNCTIONAL TESTS:  5x STS18.22 BERG 43/56   TODAY'S TREATMENT: 06/09/22 Walking outside [employer] L6 x62mns Rows and Lats 25# 2x10 Shoulder ext 10# 2x10 Cable rows 10# 2x10 Walking on beam  Side steps on airex  Standing volleyball hits on firm, on airex, tandem on airex in // bars  Resisted gait step over obstacles 30#   06/07/22 Walking outside  Nustep LE only L6 x576ms  Resisted gait 30# 4 ways x3  Leg ext 25# 2x15 HS curls 35# 2x15 Step ups anterior and lateral 6"  Walking on beam  Step up on BOSU Leg press 60# 2x10   05/19/22  Reassess  5xSTS- 14.20s BERG-47/56  Walking big loop outside Nustep L4 x57mns  Step up on airex x10 each side  Side steps on airex  Leg press 50# 2x10  Calf raises on leg press 40# 2x10  Pallof press 15# 2x10 each side 2 way hip 5# x10    05/17/22 Nustep L5 x663ms  Resisted gait 30# 4 way x 4  Leg ext 25# 3x10 HS curls 35# 3x10  Shoulder ext 5# 2x10 Cable rows 10# 2x10  STS with OHP on airex 2x10 Bicep curls 15# 2x10 Tricep ext 20# 2x10 Calf raises on black bar 2x12    05/12/22 Bike L5x6m36m  Step ups 6" forwards and lateral  Leg press 40# x10, 50# x10  5xSTS- 39.85s Walking on balance beam  Standing on airex hitting ball Marching on airex 5# AW Calf raises on airex 5# AW  OHP yellow ball on airex 2x10  05/10/22 Nustep L5 x5mi31m Walk outside 1 big loop Resisted gait 20# 4x4 way Pallof press 10# 2x10 Leg ext 25# 3x10 HS curls 25# 3x10 Seated rows 25# 2x10 Lat pull downs 20# 2x10    PATIENT EDUCATION:  Education details: POC Person educated: Patient Education  method: Explanation, [employer] treasurers, and Verbal cues Education comprehension: verbalized understanding, returned demonstration, verbal cues required, and tactile cues required    Patent educated about keeping lights on during the night for safety and to prevent future falls. Also educated about collection of HEP, and to not do all exercises but rather pick a few to do in order to avoid fatigue.  HOME EXERCISE PROGRAM: [id number]:  CLINICAL IMPRESSION: Patient is progress well, he has been discharged from OT and aquatics. We spoke about upcoming discharge and [facility] and believes [facility] is [facility] Will recheck goals next visit and decide from there. Good stability today with balance, slight LOB with side steps on airex.   OBJECTIVE IMPAIRMENTS Abnormal gait, decreased activity tolerance, decreased balance, decreased coordination, decreased endurance, decreased mobility, difficulty walking, decreased strength, improper body mechanics, and postural dysfunction.   ACTIVITY LIMITATIONS carrying, lifting, bending, standing, squatting, stairs, transfers, bed mobility, and locomotion level  PARTICIPATION LIMITATIONS: meal prep, cleaning, laundry, driving, shopping, community activity, occupation, and yard work  PERSONAL FACTORS Past/current experiences are also affecting patient's functional outcome.   REHAB POTENTIAL: Good  CLINICAL DECISION MAKING: Evolving/moderate complexity  EVALUATION COMPLEXITY: Moderate   GOALS: Goals reviewed with patient? Yes  SHORT TERM GOALS: Target date: 03/17/2022   I with basic HEP Baseline: Goal status: MET    LONG TERM GOALS: Target date: 06/12/2022   I with final HEP Baseline:  Goal status: INITIAL  2.  Patient will score at least 50/56 on BERG Balance Exam Baseline: 43, 03/31/22 (44), 43/56-8/15, 47/56-8/24 Goal status: IN PROGRESS  3.  Paitent  will complete 5x sit to stand in < 12 sec to demonstrate improved strength in BLE. Baseline: 39.85s-8/17, 14.20s-8/24 Goal status: IN PROGRESS  4.  Patient will ambulate at least 1000' with LRAD, MI, on level and unlevel surfaces Baseline: 03/31/22 able to walk 1000' w/o AD on even surfaces, some fatigue towards end, SBA Goal status: MET  5.  Patient will be able to manage cooking and cleaning activities in his home without falling and without requiring a rest break. Baseline:  Goal status: MET   PLAN: PT FREQUENCY: 2x/week  PT DURATION: 12 weeks  PLANNED INTERVENTIONS: Therapeutic exercises, Therapeutic activity, Neuromuscular re-education, Balance training, Gait training, Patient/Family education, Joint mobilization, Stair training, and Manual therapy  PLAN FOR NEXT SESSION: gait and balance activities, core strengthening   Andris Baumann, DPT 06/09/2022, 10:58 AM

## 2022-06-09 ENCOUNTER — Ambulatory Visit: Payer: Medicare Other | Admitting: Occupational Therapy

## 2022-06-09 ENCOUNTER — Ambulatory Visit: Payer: Medicare Other

## 2022-06-09 ENCOUNTER — Encounter: Payer: Self-pay | Admitting: Occupational Therapy

## 2022-06-09 DIAGNOSIS — R2689 Other abnormalities of gait and mobility: Secondary | ICD-10-CM

## 2022-06-09 DIAGNOSIS — R293 Abnormal posture: Secondary | ICD-10-CM

## 2022-06-09 DIAGNOSIS — M25511 Pain in right shoulder: Secondary | ICD-10-CM | POA: Diagnosis not present

## 2022-06-09 DIAGNOSIS — R2681 Unsteadiness on feet: Secondary | ICD-10-CM | POA: Diagnosis not present

## 2022-06-09 DIAGNOSIS — R278 Other lack of coordination: Secondary | ICD-10-CM

## 2022-06-09 DIAGNOSIS — R262 Difficulty in walking, not elsewhere classified: Secondary | ICD-10-CM | POA: Diagnosis not present

## 2022-06-09 DIAGNOSIS — M6281 Muscle weakness (generalized): Secondary | ICD-10-CM

## 2022-06-09 DIAGNOSIS — M3320 Polymyositis, organ involvement unspecified: Secondary | ICD-10-CM

## 2022-06-09 DIAGNOSIS — M25512 Pain in left shoulder: Secondary | ICD-10-CM

## 2022-06-09 DIAGNOSIS — R296 Repeated falls: Secondary | ICD-10-CM | POA: Diagnosis not present

## 2022-06-09 NOTE — Therapy (Signed)
OUTPATIENT OCCUPATIONAL THERAPY TREATMENT NOTE & DISCHARGE SUMMARY   Patient Name: [name] MRN: [id number] DOB:03/09/1956, 66 y.o., male Today's Date: 06/09/2022  PCP: Greig Right, MD REFERRING PROVIDER: Cathlyn Parsons, PA-C   OT End of Session - 06/09/22 0933     Visit Number 22    Number of Visits 26    Date for OT Re-Evaluation 06/11/22    Authorization Type BCBS Medicare    Authorization Time Period VL: [place]    OT Start Time 0931    OT Stop Time 1004   OT Time Calculation (min) 33 min    Activity Tolerance Patient tolerated treatment well    Behavior During Therapy WFL for tasks assessed/performed            OCCUPATIONAL THERAPY DISCHARGE SUMMARY  Visits from Start of Care: 22  Current functional level related to goals / functional outcomes: Pt is currently Mod Ind - Independent w/ all ADLs, per pt report; see goals below for detail   Remaining deficits: Decreased activity tolerance and overall endurance; decreased RUE strength (does have h/o rotator cuff injury)   Education / Equipment: Condition-specific education, compensatory strategies including AE prn, fall prevention, energy conservation, body mechanics, pt-specific HEP  Patient agrees to discharge. Patient goals were met. Patient is being discharged due to meeting stated rehab goals and being pleased with the current functional level.   Past Medical History:  Diagnosis Date   Aortic stenosis    mild AS 02/04/22   Arthritis    Diabetes mellitus    fasting 120s   History of kidney stones    Hypertension    Myocardial infarction ([place])    stent placed   Peripheral neuropathy    left fifth finger, left toe   Pneumonia    hx of   Past Surgical History:  Procedure Laterality Date   APPENDECTOMY     CORONARY STENT INTERVENTION N/A 12/28/2021   Procedure: CORONARY STENT INTERVENTION;  Surgeon: Jettie Booze, MD;  Location: [facility];  Service: Cardiovascular;  Laterality:  N/A;   CYSTOSCOPY W/ URETERAL STENT PLACEMENT Bilateral 03/17/2014   Procedure: CYSTOSCOPY WITH RIGHT STENT PULL  ;  Surgeon: Alexis Frock, MD;  [facility];  Service: Urology;  Laterality: Bilateral;   CYSTOSCOPY WITH RETROGRADE PYELOGRAM, URETEROSCOPY AND STENT PLACEMENT Bilateral 02/01/2014   Procedure: CYSTOSCOPY WITH BILATERAL RETROGRADE CYSTOSCOPY, BILATERAL RETROGRADE, PYELOGRAM, RIGHT URETEROSCOPY, STONE BASKETRY,  AND BILATERAL STENT PLACEMENT;  Surgeon: Alexis Frock, MD;  [facility];  Service: Urology;  Laterality: Bilateral;   CYSTOSCOPY WITH URETEROSCOPY AND STENT PLACEMENT Left 02/12/2021   Procedure: CYSTOSCOPY WITH LEFT URETEROSCOPY, BALLOON DILITATION, HOLMIUM LASER LITHOTRIPSY AND STENT PLACEMENT;  Surgeon: Lucas Mallow, MD;  [facility];  Service: Urology;  Laterality: Left;   HOLMIUM LASER APPLICATION Left 04/20/2034   Procedure: HOLMIUM LASER APPLICATION;  Surgeon: Alexis Frock, MD;  [facility];  Service: Urology;  Laterality: Left;   INTRAVASCULAR ULTRASOUND/IVUS N/A 12/28/2021   Procedure: Intravascular Ultrasound/IVUS;  Surgeon: Jettie Booze, MD;  Location: [facility];  Service: Cardiovascular;  Laterality: N/A;   LEFT HEART CATH AND CORONARY ANGIOGRAPHY N/A 12/28/2021   Procedure: LEFT HEART CATH AND CORONARY ANGIOGRAPHY;  Surgeon: Jettie Booze, MD;  Location: [facility];  Service: Cardiovascular;  Laterality: N/A;   LITHOTRIPSY     MUSCLE BIOPSY Left 04/19/2022   Procedure: LEFT THIGH MUSCLE BIOPSY;  Surgeon: Erroll Luna, MD;  Location: [facility];  Service: General;  Laterality: Left;   NEPHROLITHOTOMY Left 03/17/2014   Procedure: NEPHROLITHOTOMY PERCUTANEOUS with access;  Surgeon: Alexis Frock, MD;  [facility];  Service: Urology;  Laterality: Left;   NEPHROLITHOTOMY Left 03/19/2014   Procedure: 2ND STAGE NEPHROLITHOTOMY PERCUTANEOUS left ureteroscopy with lasr stone treatment and stent placement, with access;   Surgeon: Alexis Frock, MD;  [facility];  Service: Urology;  Laterality: Left;   TOTAL KNEE ARTHROPLASTY Right 09/02/2014   Procedure: RIGHT TOTAL KNEE ARTHROPLASTY;  Surgeon: Meredith Pel, MD;  Location: [facility];  Service: Orthopedics;  Laterality: Right;   uretral stent     Patient Active Problem List   Diagnosis Date Noted   Diabetic peripheral neuropathy associated with type 2 diabetes mellitus ([place]) 03/23/2022   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   Gait abnormality 03/07/2022   Cerebrovascular accident (CVA) ([place]) 03/07/2022   Myalgia due to statin 02/28/2022   Mild aortic stenosis 02/28/2022   Polymyositis ([place]) 02/04/2022   Elevated CK 02/02/2022   Elevated LFTs 02/02/2022   CAD (coronary artery disease) 02/02/2022   Muscle weakness of all 4 extremities 02/02/2022   Hyperlipidemia 01/28/2022   Arthritis of knee 09/02/2014   Staghorn kidney stones 03/17/2014   Nephrolithiasis 02/01/2014   Pneumonia 08/27/2011   Diabetes mellitus with coincident hypertension ([place]) 08/27/2011   ONSET DATE: 12/27/21  REFERRING DIAG: M33.20 (ICD-10-CM) - Polymyositis, organ involvement unspecified   THERAPY DIAG:  Muscle weakness (generalized)  Other abnormalities of gait and mobility  Right shoulder pain, unspecified chronicity  Left shoulder pain, unspecified chronicity  Rationale for Evaluation and Treatment Rehabilitation  PERTINENT HISTORY: Suspected polymyositis; non-ST-elevation myocardial infarction (NSTEMI) s/p percutaneous coronary intervention (PCI) and drug-eluting stent (DES) placement 12/28/21; incidental small silent brainstem stroke (R pons infarct) identified per MRI; cervical spine neuroforaminal stenosis  R shoulder partial to near full-thickness supraspinatus tear; bilateral shoulder pain w/ R > L  Additional PMH: CAD, DMT2, HTN, dyslipidemia, nephrolithiasis, recent transaminitis, h/o stage I prostate cancer, and R TKA in 2016  PRECAUTIONS: Fall; gradual  return to driving  SUBJECTIVE:  SUBJECTIVE STATEMENT: "I feel like I'm back to my normal other than my stamina"  PAIN: Are you having pain? 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 [employer] at the [employer]; travels for bands/festivals; yardwork  OBJECTIVE:   HAND DOMINANCE: Left  TODAY'S TREATMENT - 06/09/22:  Ball Toss  Tossing/catching medball against inclined trampoline while standing w/ each UE 3x10; completed w/ 6.6 lb medball  Functional Reach Retrieving 6.6 lb med ball to tap targets alternating between low and overhead heights w/ BUE to simulate functional reach at home; 2 sets of 10    PATIENT EDUCATION: Patient education completed  ------------------------------------------------------------------------------------------------------------------------------------------------------  UPPER EXTREMITY ROM     Active ROM Right Eval - 5/25  6/27  7/6  8/3  8/17  Shoulder flexion 51 48 52 73 128  Shoulder abduction 50 68 63 81 92  Shoulder extension 40      Shoulder IR 80      Shoulder ER 40    73  Elbow flexion 135      Elbow extension 41 3     (Blank rows = not tested)  Active ROM Left Eval - 5/25  6/27  7/6  8/3  8/17  Shoulder flexion 55 79 96 147 161  Shoulder abduction 58 87  Shoulder extension 47      Shoulder IR 84      Shoulder ER 35      Elbow flexion 138      Elbow extension 6 4     (Blank rows = not tested)  UPPER EXTREMITY MMT:     MMT Right 8/17 Left 8/17  Shoulder flexion 3/5 3+/5  Shoulder abduction 3/5 3+/5  Shoulder adduction 4-/5 4-/5  Shoulder extension 4-/5 4-/5  Shoulder internal rotation 4/5 4/5  Shoulder external rotation 4-/5 4-/4  Elbow flexion 4/5 4/5  Elbow extension 4/5 4/5  (Blank rows = not  tested)  ------------------------------------------------------------------------------------------------------------------------------------------------------  HOME EXERCISE PROGRAM: MedBridge Code: [id number] - Supine Shoulder Flexion AAROM with Hands Clasped  - 2 x daily - 1 sets - 10 reps - 5 sec hold - Shoulder Flexion Wall Slide with Towel  - 2 x daily - 7 x weekly - 2 sets - 5 reps - Seated Shoulder Flexion with Dowel to 90  - 2 x daily - 2-3 sets - 10-15 reps - Seated Shoulder Row with Anchored Resistance  - 2 x daily - 2-3 sets - 10-15 reps - Seated Shoulder External Rotation with Dumbbells  - 2 x daily - 2-3 sets - 10-15 reps - Seated Upper Trapezius Stretch  - 2 x daily - 7 x weekly - 1 sets - 25 reps - Gentle Levator Scapulae Stretch  - 2 x daily - 7 x weekly - 1 sets - 25 reps  GOALS: Goals reviewed with patient? Yes  SHORT TERM GOALS: Target date: 03/31/22  STG  Status:  1 Pt will verbalize understanding of at least 3 fall prevention and/or energy conservation strategies to increase safety w/ functional mobility at home Baseline: Decreased knowledge of strategies Met - 02/24/22  2 Pt will demonstrate independence w/ recommended DME and/or AE (e.g., long-handle sponge, dressing stick, etc.) to improve participation and safety w/ ADLs Baseline: Decreased knowledge of AE Met - 03/31/22  3 Pt will improve functional use of BUEs as evidence by being able to achieve at least 70 degrees of shoulder flexion w/out pain Baseline: RUE: 51 deg, LUE: 55 deg Met - 03/31/22:  RUE: 79 deg,  LUE: 96 deg  4 Pt will improve functional bilateral reach by increasing R elbow extension to at least 25 degrees Baseline: 41* of flexion when attempting extension Met - 03/22/22; WFL    LONG TERM GOALS: Target date: 06/11/22  LTG  Status:  1 Pt will demonstrate independence w/ full HEP designed for ROM and strength of BUEs Baseline: No HEP at this time Met - 04/28/22  2 Pt will improve QuickDASH score by at  least 19 points (40) to indicate mild functional impairment Baseline: 59.1 (moderate, borderline severe) impairment) Met - 04/07/22: 22.7  3 Pt will improve functional use of BUEs during grooming tasks (e.g., shampooing hair) as evidenced by improving strength of BUE shoulder flexion to at least 3+/5  Baseline: poor Met -  06/09/22: RUE 4-/5 05/12/22: RUE 3/5, LUE 3+/5  4 With both hands, pt will be able to grip w/ at least 20 lbs of force to improve functional use of BUEs during IADLs Baseline: Unable to assess at eval due to medical restrictions Met - 04/07/22: RUE 56 lbs, LUE 73 lbs  5 Pt will be able to retrieve lightweight object (< 5 lbs) from overhead height w/out pain by d/c Baseline: Unable to reach overhead Met - 04/07/22  6 Pt will be able to retrieve  medium-weight object (~5 lbs) from both low and overhead heights by d/c Baseline: Difficulty retrieving cast iron skillet from oven Met - 06/09/22  7 Pt will report being able to consistently complete bed mobility within acceptable amount of time w/out UE support Baseline: currently taking about 2 minutes; reports increased difficulty rolling/pushing up toward R side > L side Met - 06/09/22: "I'm popping right up"    ASSESSMENT:  CLINICAL IMPRESSION: Teige Rountree is a 66 y/o who has been seen in OP OT for generalized weakness, falls, and shoulder pain w/ decreased overall functional use of BUEs 2/2 inflammatory myelopathy. Pt has shown improvements in all areas addressed in therapy, verbalizing or demonstrating understanding of provided education, w/ all STGs and LTGs met. Pt also participated in aquatic therapy, which was successful, and was discharged from services 06/06/22. In preparation for d/c this session, OT reviewed all goals and current level of participation in functional activities w/ pt reporting no concerns at this time. Pt is appropriate for d/c from skilled OT to HEP, reports he is satisfied with progress, and is currently  agreeable to discharge plan.   PLAN: OT FREQUENCY: 2x/week  OT DURATION: 10 weeks  PLANNED INTERVENTIONS: self care/ADL training, therapeutic exercise, therapeutic activity, manual therapy, passive range of motion, functional mobility training, aquatic therapy, moist heat, cryotherapy, patient/family education, energy conservation, DME and/or AE instructions, and Re-evaluation  RECOMMENDED OTHER SERVICES: Currently receiving PT services at this location  CONSULTED AND AGREED WITH PLAN OF CARE: Patient  PLAN FOR NEXT SESSION: D/C   Kathrine Cords, MSOT, OTR/L 06/09/2022, 10:15 AM

## 2022-06-13 NOTE — Therapy (Signed)
OUTPATIENT PHYSICAL THERAPY LOWER EXTREMITY TREATMENT   Patient Name: [name] MRN: [id number] DOB:[birth year], 66 y.o., male Today's Date: 06/14/2022   PT End of Session - 06/14/22 0927     Visit Number 23    Date for PT Re-Evaluation 06/13/22    PT Start Time 0930    PT Stop Time 1005    PT Time Calculation (min) 35 min    Activity Tolerance Patient tolerated treatment well    Behavior During Therapy [facility] for tasks assessed/performed                        Past Medical History:  Diagnosis Date   Aortic stenosis    mild AS 02/04/22   Arthritis    Diabetes mellitus    fasting 120s   History of kidney stones    Hypertension    Myocardial infarction ([place])    stent placed   Peripheral neuropathy    left fifth finger, left toe   Pneumonia    hx of   Past Surgical History:  Procedure Laterality Date   APPENDECTOMY     CORONARY STENT INTERVENTION N/A 12/28/2021   Procedure: CORONARY STENT INTERVENTION;  Surgeon: Jettie Booze, MD;  Location: [facility];  Service: Cardiovascular;  Laterality: N/A;   CYSTOSCOPY W/ URETERAL STENT PLACEMENT Bilateral 03/17/2014   Procedure: CYSTOSCOPY WITH RIGHT STENT PULL  ;  Surgeon: Alexis Frock, MD;  [facility];  Service: Urology;  Laterality: Bilateral;   CYSTOSCOPY WITH RETROGRADE PYELOGRAM, URETEROSCOPY AND STENT PLACEMENT Bilateral 02/01/2014   Procedure: CYSTOSCOPY WITH BILATERAL RETROGRADE CYSTOSCOPY, BILATERAL RETROGRADE, PYELOGRAM, RIGHT URETEROSCOPY, STONE BASKETRY,  AND BILATERAL STENT PLACEMENT;  Surgeon: Alexis Frock, MD;  [facility];  Service: Urology;  Laterality: Bilateral;   CYSTOSCOPY WITH URETEROSCOPY AND STENT PLACEMENT Left 02/12/2021   Procedure: CYSTOSCOPY WITH LEFT URETEROSCOPY, BALLOON DILITATION, HOLMIUM LASER LITHOTRIPSY AND STENT PLACEMENT;  Surgeon: Lucas Mallow, MD;  [facility];  Service: Urology;  Laterality: Left;   HOLMIUM LASER APPLICATION Left 05/24/5620    Procedure: HOLMIUM LASER APPLICATION;  Surgeon: Alexis Frock, MD;  [facility];  Service: Urology;  Laterality: Left;   INTRAVASCULAR ULTRASOUND/IVUS N/A 12/28/2021   Procedure: Intravascular Ultrasound/IVUS;  Surgeon: Jettie Booze, MD;  Location: [facility];  Service: Cardiovascular;  Laterality: N/A;   LEFT HEART CATH AND CORONARY ANGIOGRAPHY N/A 12/28/2021   Procedure: LEFT HEART CATH AND CORONARY ANGIOGRAPHY;  Surgeon: Jettie Booze, MD;  Location: [facility];  Service: Cardiovascular;  Laterality: N/A;   LITHOTRIPSY     MUSCLE BIOPSY Left 04/19/2022   Procedure: LEFT THIGH MUSCLE BIOPSY;  Surgeon: Erroll Luna, MD;  Location: [facility];  Service: General;  Laterality: Left;   NEPHROLITHOTOMY Left 03/17/2014   Procedure: NEPHROLITHOTOMY PERCUTANEOUS with access;  Surgeon: Alexis Frock, MD;  [facility];  Service: Urology;  Laterality: Left;   NEPHROLITHOTOMY Left 03/19/2014   Procedure: 2ND STAGE NEPHROLITHOTOMY PERCUTANEOUS left ureteroscopy with lasr stone treatment and stent placement, with access;  Surgeon: Alexis Frock, MD;  [facility];  Service: Urology;  Laterality: Left;   TOTAL KNEE ARTHROPLASTY Right 09/02/2014   Procedure: RIGHT TOTAL KNEE ARTHROPLASTY;  Surgeon: Meredith Pel, MD;  Location: [facility];  Service: Orthopedics;  Laterality: Right;   uretral stent     Patient Active Problem List   Diagnosis Date Noted   Diabetic peripheral neuropathy associated with type 2 diabetes mellitus (  [place]) 03/23/2022   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   Gait abnormality 03/07/2022   Cerebrovascular accident (CVA) ([place]) 03/07/2022   Myalgia due to statin 02/28/2022   Mild aortic stenosis 02/28/2022   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   Elevated CK 02/02/2022   Elevated LFTs 02/02/2022   CAD (coronary artery disease) 02/02/2022   Muscle weakness of all 4 extremities 02/02/2022   Hyperlipidemia 01/28/2022   Arthritis of knee 09/02/2014   Staghorn  kidney stones 03/17/2014   Nephrolithiasis 02/01/2014   Pneumonia 08/27/2011   Diabetes mellitus with coincident hypertension ([place]) 08/27/2011    PCP: Greig Right, MD  REFERRING PROVIDER: Cathlyn Parsons, PA-C   REFERRING DIAG: M33.20 (ICD-10-CM) - Polymyositis, organ involvement unspecified   THERAPY DIAG:  Muscle weakness (generalized)  Unsteadiness on feet  Other abnormalities of gait and mobility  Abnormal posture  Polymyositis (HCC)  Other lack of coordination  Difficulty in walking, not elsewhere classified  Rationale for Evaluation and Treatment Rehabilitation  ONSET DATE: 02/15/2022   SUBJECTIVE:   SUBJECTIVE STATEMENT: I feel good, I am ready to get back to the [facility] in place of the days I come here.   PERTINENT HISTORY: JANARD CULP is a 66 y.o. right-handed male with history of type 2 diabetes mellitus hypertension intolerance of statin due to abnormal LFTs (off for the past years), CAD status post DES to RCA 12/28/2021 (addition of arterial for statin 20 mg, aspirin/Brilinta),   PAIN:  Are you having pain? No  PRECAUTIONS: None  WEIGHT BEARING RESTRICTIONS No  FALLS:  Has patient fallen in last 6 months? Yes. Number of falls 5  LIVING ENVIRONMENT: Lives with: lives with their family and lives alone Lives in: House/apartment Stairs: No Has following equipment at home: Gilford Rile - 4 wheeled, shower chair, Grab bars, and None  OCCUPATION: Comptroller, office work, Government social research officer.  PLOF: Independent  PATIENT GOALS Increase strength, return to his active lifestyle. 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 Travels to attend concerts.   OBJECTIVE:   MUSCLE LENGTH: Hamstrings: Right 62 deg; Left 68 deg   POSTURE: rounded shoulders   LOWER EXTREMITY ROM: WFL throughout, R hip with mild     tightness.   LOWER EXTREMITY MMT:  MMT Right eval Left eval Right 03/31/22 Left 03/31/22  Hip flexion 4- 3+ 4-  4-  Hip extension 4- 3+ 4- 3+  Hip abduction 4- 3+ 4- 3+  Hip adduction      Hip internal rotation      Hip external rotation      Knee flexion 4 4- 4 4  Knee extension 4 4- 4 4  Ankle dorsiflexion 4 4    Ankle plantarflexion      Ankle inversion      Ankle eversion       (Blank rows = not tested)   FUNCTIONAL TESTS:  5x STS18.22 BERG 43/56   TODAY'S TREATMENT: 06/13/22 Recheck BERG and 5xSTS and goals  Nustep L6 x41mins  UBE L4 x70mins Leg press 60#  Leg ext 25#  HS curls 35#  Rows and lats 25#  Tandem standing w/support SL standing w/support  06/09/22 Walking outside [employer] L6 x30mins Rows and Lats 25# 2x10 Shoulder ext 10# 2x10 Cable rows 10# 2x10 Walking on beam  Side steps on airex  Standing volleyball hits on firm, on airex, tandem on airex in // bars  Resisted gait step over obstacles 30#   06/07/22 Walking outside  Nustep LE only L6 x69mins  Resisted gait 30# 4 ways x3  Leg ext 25# 2x15 HS curls 35# 2x15 Step ups anterior and lateral 6"  Walking on beam  Step up on BOSU Leg press 60# 2x10   05/19/22 Reassess  5xSTS- 14.20s BERG-47/56  Walking big loop outside Nustep L4 x76mins  Step up on airex x10 each side  Side steps on airex  Leg press 50# 2x10  Calf raises on leg press 40# 2x10  Pallof press 15# 2x10 each side 2 way hip 5# x10    05/17/22 Nustep L5 x93mins  Resisted gait 30# 4 way x 4  Leg ext 25# 3x10 HS curls 35# 3x10  Shoulder ext 5# 2x10 Cable rows 10# 2x10  STS with OHP on airex 2x10 Bicep curls 15# 2x10 Tricep ext 20# 2x10 Calf raises on black bar 2x12    05/12/22 Bike L5x34mins  Step ups 6" forwards and lateral  Leg press 40# x10, 50# x10  5xSTS- 39.85s Walking on balance beam  Standing on airex hitting ball Marching on airex 5# AW Calf raises on airex 5# AW  OHP yellow ball on airex 2x10  05/10/22 Nustep L5 x19mins  Walk outside 1 big loop Resisted gait 20# 4x4 way Pallof press 10# 2x10 Leg ext 25# 3x10 HS curls  25# 3x10 Seated rows 25# 2x10 Lat pull downs 20# 2x10    PATIENT EDUCATION:  Education details: POC Person educated: Patient Education method: Explanation, Corporate treasurer cues, and Verbal cues Education comprehension: verbalized understanding, returned demonstration, verbal cues required, and tactile cues required    Patent educated about keeping lights on during the night for safety and to prevent future falls. Also educated about collection of HEP, and to not do all exercises but rather pick a few to do in order to avoid fatigue.  HOME EXERCISE PROGRAM: [id number]  ASSESSMENT:  CLINICAL IMPRESSION: Patient has made great strides since the beginning of therapy. We reassessed his functioncal tests and his LTGs. He has met all of this goals. We worked on creating a gym program with the adequate amount of weights and how to manipulate the machines so he can do it independently at the gym. D/c patient.   OBJECTIVE IMPAIRMENTS Abnormal gait, decreased activity tolerance, decreased balance, decreased coordination, decreased endurance, decreased mobility, difficulty walking, decreased strength, improper body mechanics, and postural dysfunction.   ACTIVITY LIMITATIONS carrying, lifting, bending, standing, squatting, stairs, transfers, bed mobility, and locomotion level  PARTICIPATION LIMITATIONS: meal prep, cleaning, laundry, driving, shopping, community activity, occupation, and yard work  PERSONAL FACTORS Past/current experiences are also affecting patient's functional outcome.   REHAB POTENTIAL: Good  CLINICAL DECISION MAKING: Evolving/moderate complexity  EVALUATION COMPLEXITY: Moderate   GOALS: Goals reviewed with patient? Yes  SHORT TERM GOALS: Target date: 03/17/2022   I with basic HEP Baseline: Goal status: MET    LONG TERM GOALS: Target date: 06/12/2022   I with final HEP Baseline:  Goal status: MET  2.  Patient will score at least 50/56 on BERG Balance Exam Baseline: 43,  03/31/22 (44), 43/56-8/15, 47/56-8/24, 51/56-9/19 Goal status: MET  3.  Paitent will complete 5x sit to stand in < 12 sec to demonstrate improved strength in BLE. Baseline: 39.85s-8/17, 14.20s-8/24, 9.65s-9/19 Goal status: MET  4.  Patient will ambulate at least 1000' with LRAD, MI, on level and unlevel surfaces Baseline: 03/31/22 able to walk 1000' w/o AD on even surfaces, some fatigue towards end, SBA Goal status: MET  5.  Patient will be able to manage cooking and cleaning  activities in his home without falling and without requiring a rest break. Baseline:  Goal status: MET   PLAN: PT FREQUENCY: 2x/week  PT DURATION: 12 weeks  PLANNED INTERVENTIONS: Therapeutic exercises, Therapeutic activity, Neuromuscular re-education, Balance training, Gait training, Patient/Family education, Joint mobilization, Stair training, and Manual therapy  PLAN FOR NEXT SESSION: discharged  PHYSICAL THERAPY DISCHARGE SUMMARY  Visits from Start of Care: 23  Patient agrees to discharge. Patient goals were met. Patient is being discharged due to meeting the stated rehab goals.   Andris Baumann, DPT 06/14/2022, 10:05 AM

## 2022-06-14 ENCOUNTER — Ambulatory Visit: Payer: Medicare Other

## 2022-06-14 DIAGNOSIS — R2681 Unsteadiness on feet: Secondary | ICD-10-CM

## 2022-06-14 DIAGNOSIS — M25512 Pain in left shoulder: Secondary | ICD-10-CM | POA: Diagnosis not present

## 2022-06-14 DIAGNOSIS — M25511 Pain in right shoulder: Secondary | ICD-10-CM | POA: Diagnosis not present

## 2022-06-14 DIAGNOSIS — R293 Abnormal posture: Secondary | ICD-10-CM

## 2022-06-14 DIAGNOSIS — R2689 Other abnormalities of gait and mobility: Secondary | ICD-10-CM

## 2022-06-14 DIAGNOSIS — R296 Repeated falls: Secondary | ICD-10-CM | POA: Diagnosis not present

## 2022-06-14 DIAGNOSIS — M3320 Polymyositis, organ involvement unspecified: Secondary | ICD-10-CM | POA: Diagnosis not present

## 2022-06-14 DIAGNOSIS — R262 Difficulty in walking, not elsewhere classified: Secondary | ICD-10-CM | POA: Diagnosis not present

## 2022-06-14 DIAGNOSIS — M6281 Muscle weakness (generalized): Secondary | ICD-10-CM | POA: Diagnosis not present

## 2022-06-14 DIAGNOSIS — R278 Other lack of coordination: Secondary | ICD-10-CM

## 2022-06-15 DIAGNOSIS — R748 Abnormal levels of other serum enzymes: Secondary | ICD-10-CM | POA: Diagnosis not present

## 2022-06-16 ENCOUNTER — Ambulatory Visit: Payer: Medicare Other

## 2022-06-21 ENCOUNTER — Ambulatory Visit: Payer: Medicare Other

## 2022-06-22 ENCOUNTER — Telehealth: Payer: Self-pay | Admitting: Neurology

## 2022-06-22 DIAGNOSIS — G7249 Other inflammatory and immune myopathies, not elsewhere classified: Secondary | ICD-10-CM | POA: Diagnosis not present

## 2022-06-22 NOTE — Telephone Encounter (Signed)
LVM and sent mychart msg informing pt of need to reschedule 12/28 appointment - MD out 

## 2022-06-23 ENCOUNTER — Ambulatory Visit: Payer: Medicare Other | Admitting: Occupational Therapy

## 2022-06-23 ENCOUNTER — Ambulatory Visit: Payer: Medicare Other

## 2022-07-04 DIAGNOSIS — M3322 Polymyositis with myopathy: Secondary | ICD-10-CM | POA: Diagnosis not present

## 2022-07-04 DIAGNOSIS — R748 Abnormal levels of other serum enzymes: Secondary | ICD-10-CM | POA: Diagnosis not present

## 2022-07-04 DIAGNOSIS — G720 Drug-induced myopathy: Secondary | ICD-10-CM | POA: Diagnosis not present

## 2022-07-04 DIAGNOSIS — E1121 Type 2 diabetes mellitus with diabetic nephropathy: Secondary | ICD-10-CM | POA: Diagnosis not present

## 2022-07-05 NOTE — Progress Notes (Deleted)
Cardiology Office Note:    Date:  07/05/2022   ID:  [name], DOB 08-17-1956, MRN [id number]  PCP:  Greig Right, MD   [facility] Providers Cardiologist:  Werner Lean, MD     Referring MD: Greig Right, MD   CC: CAD f/u   History of Present Illness:    [name] is a 66 y.o. male with a hx of CAD s/p NSTEMI s/p DES [facility] 12/2021.  HTN with DM, HLD with DM, and transaminitis off statin and on Repatha.  2023:  April 2023 in follow up with worsening dizziness and low BP.  Had ED visit 02/19/22 for fall and hitting head. Low risk for muscle biopsy; sent information   Patient notes that he is doing ***.   Since last visit notes *** . There are no*** interval hospital/ED visit.    No chest pain or pressure ***.  No SOB/DOE*** and no PND/Orthopnea***.  No weight gain or leg swelling***.  No palpitations or syncope ***.  Ambulatory blood pressure ***.   Past Medical History:  Diagnosis Date   Aortic stenosis    mild AS 02/04/22   Arthritis    Diabetes mellitus    fasting 120s   History of kidney stones    Hypertension    Myocardial infarction ([place])    stent placed   Peripheral neuropathy    left fifth finger, left toe   Pneumonia    hx of    Past Surgical History:  Procedure Laterality Date   APPENDECTOMY     CORONARY STENT INTERVENTION N/A 12/28/2021   Procedure: CORONARY STENT INTERVENTION;  Surgeon: Jettie Booze, MD;  Location: [place] [facility];  Service: Cardiovascular;  Laterality: N/A;   CYSTOSCOPY W/ URETERAL STENT PLACEMENT Bilateral 03/17/2014   Procedure: CYSTOSCOPY WITH RIGHT STENT PULL  ;  Surgeon: Alexis Frock, MD;  [facility];  Service: Urology;  Laterality: Bilateral;   CYSTOSCOPY WITH RETROGRADE PYELOGRAM, URETEROSCOPY AND STENT PLACEMENT Bilateral 02/01/2014   Procedure: CYSTOSCOPY WITH BILATERAL RETROGRADE CYSTOSCOPY, BILATERAL RETROGRADE, PYELOGRAM, RIGHT URETEROSCOPY, STONE BASKETRY,  AND BILATERAL STENT  PLACEMENT;  Surgeon: Alexis Frock, MD;  [facility];  Service: Urology;  Laterality: Bilateral;   CYSTOSCOPY WITH URETEROSCOPY AND STENT PLACEMENT Left 02/12/2021   Procedure: CYSTOSCOPY WITH LEFT URETEROSCOPY, BALLOON DILITATION, HOLMIUM LASER LITHOTRIPSY AND STENT PLACEMENT;  Surgeon: Lucas Mallow, MD;  [facility];  Service: Urology;  Laterality: Left;   HOLMIUM LASER APPLICATION Left 12/09/1759   Procedure: HOLMIUM LASER APPLICATION;  Surgeon: Alexis Frock, MD;  [facility];  Service: Urology;  Laterality: Left;   INTRAVASCULAR ULTRASOUND/IVUS N/A 12/28/2021   Procedure: Intravascular Ultrasound/IVUS;  Surgeon: Jettie Booze, MD;  Location: [place] [facility];  Service: Cardiovascular;  Laterality: N/A;   LEFT HEART CATH AND CORONARY ANGIOGRAPHY N/A 12/28/2021   Procedure: LEFT HEART CATH AND CORONARY ANGIOGRAPHY;  Surgeon: Jettie Booze, MD;  Location: [place] [facility];  Service: Cardiovascular;  Laterality: N/A;   LITHOTRIPSY     MUSCLE BIOPSY Left 04/19/2022   Procedure: LEFT THIGH MUSCLE BIOPSY;  Surgeon: Erroll Luna, MD;  Location: [place];  Service: General;  Laterality: Left;   NEPHROLITHOTOMY Left 03/17/2014   Procedure: NEPHROLITHOTOMY PERCUTANEOUS with access;  Surgeon: Alexis Frock, MD;  [facility];  Service: Urology;  Laterality: Left;   NEPHROLITHOTOMY Left 03/19/2014   Procedure: 2ND STAGE NEPHROLITHOTOMY PERCUTANEOUS left ureteroscopy with lasr stone treatment and stent placement, with access;  Surgeon: Alexis Frock, MD;  [facility];  Service: Urology;  Laterality: Left;   TOTAL KNEE ARTHROPLASTY Right 09/02/2014   Procedure: RIGHT TOTAL KNEE ARTHROPLASTY;  Surgeon: Meredith Pel, MD;  Location: [place];  Service: Orthopedics;  Laterality: Right;   uretral stent      Current Medications: No outpatient medications have been marked as taking for the 07/06/22 encounter (Appointment) with Werner Lean, MD.      Allergies:   Atorvastatin calcium   Social History   Socioeconomic History   Marital status: Widowed    Spouse name: Not on file   Number of children: 2   Years of education: 3 yeae   Highest education level: Not on file  Occupational History   Not on file  Tobacco Use   Smoking status: Never   Smokeless tobacco: Never  Vaping Use   Vaping Use: Never used  Substance and Sexual Activity   Alcohol use: Yes    Alcohol/week: 1.0 standard drink of alcohol    Types: 1 Cans of beer per week    Comment: occasional/ 1 beers week   Drug use: No   Sexual activity: Yes    Birth control/protection: Other-see comments  Other Topics Concern   Not on file  Social History Narrative   Not on file   Social Determinants of Health   Financial Resource Strain: Not on file  Food Insecurity: Not on file  Transportation Needs: Not on file  Physical Activity: Not on file  Stress: Not on file  Social Connections: Not on file     Family History: The patient's family history includes Alzheimer's disease in his mother; Heart attack in his father.  ROS:   Please see the history of present illness.     All other systems reviewed and are negative.  EKGs/Labs/Other Studies Reviewed:    The following studies were reviewed today:  Transthoracic Echocardiogram: Date:02/04/22 Results:   1. Left ventricular ejection fraction, by estimation, is 55%. The  left ventricle has normal function. Left ventricular diastolic parameters  are consistent with Grade I diastolic dysfunction (impaired relaxation).   2. Right ventricular systolic function is normal. The right ventricular  size is normal.   3. No evidence of mitral valve regurgitation.   4. Aortic valve regurgitation is not visualized. Aortic Sclerosis   5. The inferior vena cava is normal in size with greater than 50%  respiratory variability, suggesting right atrial pressure of 3 mmHg.   6. Agitated saline contrast bubble study was  negative, with no evidence  of any interatrial shunt.   Left/Right Heart Catheterizations: Date: 12/28/21 Results:   Ost RCA lesion is 80% stenosed.   A drug-eluting stent was successfully placed using a STENT ONYX FRONTIER 4.0X18, postdilated to 4.5 mm in diameter and optimized with intravascular ultrasound.   Post intervention, there is a 0% residual stenosis.   The left ventricular systolic function is normal.   LV end diastolic pressure is normal.   The left ventricular ejection fraction is 50-55% by visual estimate.   There is no aortic valve stenosis.   Successful PCI of the ostial RCA which was calcified.  It was a short focal area of calcification.  The distal reference was over 4 mm.  Proximal and ostial stent postdilated aggressively with a 4.5 [place] balloon.  I stressed the importance of dual antiplatelet therapy and aggressive secondary prevention.   Recent Labs: 12/27/2021: B Natriuretic Peptide 129.0 02/07/2022: Magnesium 2.1 02/14/2022: Platelets 292 03/07/2022:  TSH 0.492 05/23/2022: ALT 60; BUN 16; Creatinine, Ser 0.48; Hemoglobin 11.1; Potassium 4.1; Sodium 144  Recent Lipid Panel    Component Value Date/Time   CHOL 272 (H) 12/28/2021 0259   TRIG 198 (H) 12/28/2021 0259   HDL 41 12/28/2021 0259   CHOLHDL 6.6 12/28/2021 0259   VLDL 40 12/28/2021 0259   LDLCALC 191 (H) 12/28/2021 0259   LDLDIRECT 97.8 02/03/2022 2235       Physical Exam:    VS:  There were no vitals taken for this visit.    Wt Readings from Last 3 Encounters:  05/23/22 187 lb (84.8 kg)  04/19/22 176 lb (79.8 kg)  04/08/22 178 lb (80.7 kg)    Gen: no distress    Neck: No JVD Cardiac: No Rubs or Gallops, no murmur, RRR +2 radial pulses Respiratory: Clear to auscultation bilaterally, normal effort, normal  respiratory rate GI: Soft, nontender, non-distended  MS: No  edema;  moves all extremities Integument: Skin feels warm Neuro:  At time of evaluation, alert and oriented to  person/place/time/situation  Psych: Normal affect, patient feels well   ASSESSMENT:    No diagnosis found.  PLAN:    Coronary Artery Disease; Obstructive HLD with DM Statin Myalgia - asymptomatic  with stable angina on therapy  - anatomy: oRCA - continue ASA 81 mg - we will stop Ticagrelor and transition to plavix (start plavix 300 mg X1 and then plavix 75 mg PO daily the next day) - will send copy of our note to patient's neurologist once he knows who that is; planned for July muscle biopsy for weakness (can hold anti-platelets as needed) - continue PCSK9i, goal LDL < 55; lipids, ALT, and Lp(a) *** - continue BB (coreg 12.5 mg BID) - continue PRN  nitrates; (None needed)  - continue ACEi (40 mg )  Hypertension with Diabetes Mild AS With prior orthostatic hypotension - ambulatory blood pressure *** - continue home medications: he has some orthostatics but give labile BP and no sx during orthostatics, this may be the best compromise on medication regimen *** - echo in 2026  Six months APP       Medication Adjustments/Labs and Tests Ordered: Current medicines are reviewed at length with the patient today.  Concerns regarding medicines are outlined above.  No orders of the defined types were placed in this encounter.  No orders of the defined types were placed in this encounter.   There are no Patient Instructions on file for this visit.   Signed, Werner Lean, MD  07/05/2022 10:15 AM    [place]

## 2022-07-06 ENCOUNTER — Ambulatory Visit: Payer: Medicare Other | Admitting: Internal Medicine

## 2022-07-11 ENCOUNTER — Encounter: Payer: Self-pay | Admitting: Orthopedic Surgery

## 2022-07-11 ENCOUNTER — Ambulatory Visit: Payer: Medicare Other | Admitting: Orthopedic Surgery

## 2022-07-11 DIAGNOSIS — M25512 Pain in left shoulder: Secondary | ICD-10-CM

## 2022-07-11 DIAGNOSIS — M25511 Pain in right shoulder: Secondary | ICD-10-CM

## 2022-07-11 NOTE — Progress Notes (Signed)
Office Visit Note   Patient: [name]           Date of Birth: 09-17-1956           MRN: [id number] Visit Date: 07/11/2022 Requested by: Greig Right, MD [address] [place],  [place] [address] PCP: Greig Right, MD  Subjective: Chief Complaint  Patient presents with   Right Shoulder - Pain   Left Shoulder - Pain    HPI: [name] is a 66 y.o. male who presents to the office reporting right shoulder pain.  He states that his muscle strength has improved.  Since he was last seen he was diagnosed with some type of inflammatory myopathy and is being treated by neurology.  They are doing immunoglobulin infusions and have 10 more months of treatment for this.  He has been discharged from formal OT and physical therapy.  States he still has some proximal arm weakness.  Has an occasional pop in the right arm.  Had MRI scan of the right shoulder earlier this year which is reviewed.  Did have partial-thickness rotator cuff tearing of the supraspinatus which was reviewed with the patient.  We were considering surgical intervention for the right shoulder at his last clinic appointment 6 months ago..                ROS: All systems reviewed are negative as they relate to the chief complaint within the history of present illness.  Patient denies fevers or chills.  Assessment & Plan: Visit Diagnoses: No diagnosis found.  Plan: Impression is right shoulder partial-thickness rotator cuff tear about 70%.  All in all there is no physical exam findings today outside of slight weakness on the right compared to the left that would indicate this has progressed to full-thickness tear.  For now I think we can wait this out and recheck him in 4 months with decision for or against surgery at that time based on strength and motion assessment.  Follow-Up Instructions: No follow-ups on file.   Orders:  No orders of the defined types were placed in this encounter.  No orders of the  defined types were placed in this encounter.     Procedures: No procedures performed   Clinical Data: No additional findings.  Objective: Vital Signs: There were no vitals taken for this visit.  Physical Exam:  Constitutional: Patient appears well-developed HEENT:  Head: Normocephalic Eyes:EOM are normal Neck: Normal range of motion Cardiovascular: Normal rate Pulmonary/chest: Effort normal Neurologic: Patient is alert Skin: Skin is warm Psychiatric: Patient has normal mood and affect  Ortho Exam: Ortho exam demonstrates right shoulder passive range of motion of 60/100/170.  Rotator cuff strength is actually pretty reasonable to infraspinatus and subscap testing slightly weaker on the right 5- out of 5 to supraspinatus testing compared to the left.  No muscle atrophy in either arm.  Cervical spine range of motion is full.  No paresthesias in the arm.  No muscle wasting.  Specialty Comments:  No specialty comments available.  Imaging: No results found.   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   Elevated CK 02/02/2022   Elevated LFTs 02/02/2022  CAD (coronary artery disease) 02/02/2022   Muscle weakness of all 4 extremities 02/02/2022   Hyperlipidemia 01/28/2022   Arthritis of knee 09/02/2014   [place] kidney stones 03/17/2014   Nephrolithiasis 02/01/2014   Pneumonia 08/27/2011   Diabetes mellitus with coincident hypertension ([place]) 08/27/2011   Past Medical History:  Diagnosis Date   Aortic stenosis    mild AS 02/04/22   Arthritis    Diabetes mellitus    fasting 120s   History of kidney stones    Hypertension    Myocardial  infarction ([place])    stent placed   Peripheral neuropathy    left fifth finger, left toe   Pneumonia    hx of    Family History  Problem Relation Age of Onset   Alzheimer's disease Mother    Heart attack Father     Past Surgical History:  Procedure Laterality Date   APPENDECTOMY     CORONARY STENT INTERVENTION N/A 12/28/2021   Procedure: CORONARY STENT INTERVENTION;  Surgeon: Jettie Booze, MD;  Location: [place] [facility];  Service: Cardiovascular;  Laterality: N/A;   CYSTOSCOPY W/ URETERAL STENT PLACEMENT Bilateral 03/17/2014   Procedure: CYSTOSCOPY WITH RIGHT STENT PULL  ;  Surgeon: Alexis Frock, MD;  [facility];  Service: Urology;  Laterality: Bilateral;   CYSTOSCOPY WITH RETROGRADE PYELOGRAM, URETEROSCOPY AND STENT PLACEMENT Bilateral 02/01/2014   Procedure: CYSTOSCOPY WITH BILATERAL RETROGRADE CYSTOSCOPY, BILATERAL RETROGRADE, PYELOGRAM, RIGHT URETEROSCOPY, STONE BASKETRY,  AND BILATERAL STENT PLACEMENT;  Surgeon: Alexis Frock, MD;  [facility];  Service: Urology;  Laterality: Bilateral;   CYSTOSCOPY WITH URETEROSCOPY AND STENT PLACEMENT Left 02/12/2021   Procedure: CYSTOSCOPY WITH LEFT URETEROSCOPY, BALLOON DILITATION, HOLMIUM LASER LITHOTRIPSY AND STENT PLACEMENT;  Surgeon: Lucas Mallow, MD;  [facility];  Service: Urology;  Laterality: Left;   HOLMIUM LASER APPLICATION Left 0/62/6948   Procedure: HOLMIUM LASER APPLICATION;  Surgeon: Alexis Frock, MD;  [facility];  Service: Urology;  Laterality: Left;   INTRAVASCULAR ULTRASOUND/IVUS N/A 12/28/2021   Procedure: Intravascular Ultrasound/IVUS;  Surgeon: Jettie Booze, MD;  Location: [place] [facility];  Service: Cardiovascular;  Laterality: N/A;   LEFT HEART CATH AND CORONARY ANGIOGRAPHY N/A 12/28/2021   Procedure: LEFT HEART CATH AND CORONARY ANGIOGRAPHY;  Surgeon: Jettie Booze, MD;  Location: [place] [facility];  Service: Cardiovascular;  Laterality: N/A;   LITHOTRIPSY     MUSCLE  BIOPSY Left 04/19/2022   Procedure: LEFT THIGH MUSCLE BIOPSY;  Surgeon: Erroll Luna, MD;  Location: [place];  Service: General;  Laterality: Left;   NEPHROLITHOTOMY Left 03/17/2014   Procedure: NEPHROLITHOTOMY PERCUTANEOUS with access;  Surgeon: Alexis Frock, MD;  [facility];  Service: Urology;  Laterality: Left;   NEPHROLITHOTOMY Left 03/19/2014   Procedure: 2ND STAGE NEPHROLITHOTOMY PERCUTANEOUS left ureteroscopy with lasr stone treatment and stent placement, with access;  Surgeon: Alexis Frock, MD;  [facility];  Service: Urology;  Laterality: Left;   TOTAL KNEE ARTHROPLASTY Right 09/02/2014   Procedure: RIGHT TOTAL KNEE ARTHROPLASTY;  Surgeon: Meredith Pel, MD;  Location: [place];  Service: Orthopedics;  Laterality: Right;   uretral stent     Social History   Occupational History   Not on file  Tobacco Use   Smoking status: Never   Smokeless tobacco: Never  Vaping Use   Vaping Use: Never used  Substance and Sexual Activity   Alcohol use: Yes    Alcohol/week: 1.0 standard drink of alcohol    Types: 1  Cans of beer per week    Comment: occasional/ 1 beers week   Drug use: No   Sexual activity: Yes    Birth control/protection: Other-see comments

## 2022-07-21 ENCOUNTER — Ambulatory Visit: Payer: Medicare Other | Admitting: Internal Medicine

## 2022-07-28 ENCOUNTER — Encounter: Payer: Self-pay | Admitting: Internal Medicine

## 2022-07-28 ENCOUNTER — Ambulatory Visit: Payer: Medicare Other | Attending: Internal Medicine | Admitting: Internal Medicine

## 2022-07-28 VITALS — BP 122/68 | HR 73 | Ht 70.0 in | Wt 197.0 lb

## 2022-07-28 DIAGNOSIS — I2510 Atherosclerotic heart disease of native coronary artery without angina pectoris: Secondary | ICD-10-CM

## 2022-07-28 DIAGNOSIS — I350 Nonrheumatic aortic (valve) stenosis: Secondary | ICD-10-CM | POA: Diagnosis not present

## 2022-07-28 DIAGNOSIS — M791 Myalgia, unspecified site: Secondary | ICD-10-CM | POA: Diagnosis not present

## 2022-07-28 DIAGNOSIS — E785 Hyperlipidemia, unspecified: Secondary | ICD-10-CM | POA: Diagnosis not present

## 2022-07-28 DIAGNOSIS — E119 Type 2 diabetes mellitus without complications: Secondary | ICD-10-CM

## 2022-07-28 DIAGNOSIS — I10 Essential (primary) hypertension: Secondary | ICD-10-CM

## 2022-07-28 DIAGNOSIS — T466X5D Adverse effect of antihyperlipidemic and antiarteriosclerotic drugs, subsequent encounter: Secondary | ICD-10-CM

## 2022-07-28 LAB — LIPID PANEL
Chol/HDL Ratio: 2.7 ratio (ref 0.0–5.0)
Cholesterol, Total: 112 mg/dL (ref 100–199)
HDL: 42 mg/dL (ref >39)
LDL Chol Calc (NIH): 48 mg/dL (ref 0–99)
Triglycerides: 125 mg/dL (ref 0–149)
VLDL Cholesterol Cal: 22 mg/dL (ref 5–40)

## 2022-07-28 LAB — ALT: ALT: 23 IU/L (ref 0–44)

## 2022-07-28 NOTE — Patient Instructions (Signed)
Medication Instructions:  Your physician recommends that you continue on your current medications as directed. Please refer to the Current Medication list given to you today.  *If you need a refill on your cardiac medications before your next appointment, please call your pharmacy*  Lab Work: TODAY: lipid panel, ALT If you have labs (blood work) drawn today and your tests are completely normal, you will receive your results only by: [place] (if you have MyChart) OR A paper copy in the mail If you have any lab test that is abnormal or we need to change your treatment, we will call you to review the results.  Testing/Procedures: NONE  Follow-Up: At [facility], you and your health needs are our priority.  As part of our continuing mission to provide you with exceptional heart care, we have created designated Provider Care Teams.  These Care Teams include your primary Cardiologist (physician) and Advanced Practice Providers (APPs -  Physician Assistants and Nurse Practitioners) who all work together to provide you with the care you need, when you need it.  Your next appointment:   6 month(s)  The format for your next appointment:   In Person  Provider:   Werner Lean, MD  or Robbie Lis, PA-C, Nicholes Rough, PA-C, Ambrose Pancoast, NP, Christen Bame, NP, or Richardson Dopp, PA-C   Important Information About Sugar

## 2022-07-28 NOTE — Progress Notes (Signed)
Cardiology Office Note:    Date:  07/28/2022   ID:  [name], DOB January 12, 1956, MRN [id number]  PCP:  Greig Right, MD   [facility] Providers Cardiologist:  Werner Lean, MD     Referring MD: Greig Right, MD   CC: CAD f/u   History of Present Illness:    [name] is a 66 y.o. male with a hx of CAD s/p NSTEMI s/p DES [facility] 12/2021.  HTN with DM, HLD with DM, and transaminitis off statin and on Repatha.  2023:  April 2023 in follow up with worsening dizziness and low BP.  Had ED visit 02/19/22 for fall and hitting head. Low risk for muscle biopsy; sent information.  Had biopsy and had myopathy.  He is now on IVIG and has had steroids.  Feels way better. Anginal equivalent in 12/2021 was chest pain and DOE while mowing his lawn.  Patient notes that he is doing back to normal .   Since last visit notes clear PT and OT . There are no interval hospital/ED visit.    No chest pain or pressure .  No SOB/DOE and no PND/Orthopnea.  No weight gain or leg swelling.  No palpitations or syncope.   Past Medical History:  Diagnosis Date   Aortic stenosis    mild AS 02/04/22   Arthritis    Diabetes mellitus    fasting 120s   History of kidney stones    Hypertension    Myocardial infarction ([place])    stent placed   Peripheral neuropathy    left fifth finger, left toe   Pneumonia    hx of    Past Surgical History:  Procedure Laterality Date   APPENDECTOMY     CORONARY STENT INTERVENTION N/A 12/28/2021   Procedure: CORONARY STENT INTERVENTION;  Surgeon: Jettie Booze, MD;  Location: [place] [facility];  Service: Cardiovascular;  Laterality: N/A;   CYSTOSCOPY W/ URETERAL STENT PLACEMENT Bilateral 03/17/2014   Procedure: CYSTOSCOPY WITH RIGHT STENT PULL  ;  Surgeon: Alexis Frock, MD;  Location: [facility];  Service: Urology;  Laterality: Bilateral;   CYSTOSCOPY WITH RETROGRADE PYELOGRAM, URETEROSCOPY AND STENT PLACEMENT Bilateral 02/01/2014   Procedure: CYSTOSCOPY  WITH BILATERAL RETROGRADE CYSTOSCOPY, BILATERAL RETROGRADE, PYELOGRAM, RIGHT URETEROSCOPY, STONE BASKETRY,  AND BILATERAL STENT PLACEMENT;  Surgeon: Alexis Frock, MD;  Location: [facility];  Service: Urology;  Laterality: Bilateral;   CYSTOSCOPY WITH URETEROSCOPY AND STENT PLACEMENT Left 02/12/2021   Procedure: CYSTOSCOPY WITH LEFT URETEROSCOPY, BALLOON DILITATION, HOLMIUM LASER LITHOTRIPSY AND STENT PLACEMENT;  Surgeon: Lucas Mallow, MD;  Location: [facility];  Service: Urology;  Laterality: Left;   HOLMIUM LASER APPLICATION Left 2/70/3500   Procedure: HOLMIUM LASER APPLICATION;  Surgeon: Alexis Frock, MD;  Location: [facility];  Service: Urology;  Laterality: Left;   INTRAVASCULAR ULTRASOUND/IVUS N/A 12/28/2021   Procedure: Intravascular Ultrasound/IVUS;  Surgeon: Jettie Booze, MD;  Location: [place] [facility];  Service: Cardiovascular;  Laterality: N/A;   LEFT HEART CATH AND CORONARY ANGIOGRAPHY N/A 12/28/2021   Procedure: LEFT HEART CATH AND CORONARY ANGIOGRAPHY;  Surgeon: Jettie Booze, MD;  Location: [place] [facility];  Service: Cardiovascular;  Laterality: N/A;   LITHOTRIPSY     MUSCLE BIOPSY Left 04/19/2022   Procedure: LEFT THIGH MUSCLE BIOPSY;  Surgeon: Erroll Luna, MD;  Location: [place];  Service: General;  Laterality: Left;   NEPHROLITHOTOMY Left 03/17/2014   Procedure: NEPHROLITHOTOMY PERCUTANEOUS with access;  Surgeon: Alexis Frock, MD;  Location:  [facility];  Service: Urology;  Laterality: Left;   NEPHROLITHOTOMY Left 03/19/2014   Procedure: 2ND STAGE NEPHROLITHOTOMY PERCUTANEOUS left ureteroscopy with lasr stone treatment and stent placement, with access;  Surgeon: Alexis Frock, MD;  Location: [facility];  Service: Urology;  Laterality: Left;   TOTAL KNEE ARTHROPLASTY Right 09/02/2014   Procedure: RIGHT TOTAL KNEE ARTHROPLASTY;  Surgeon: Meredith Pel, MD;  Location: [place];  Service: Orthopedics;  Laterality: Right;   uretral stent      Current Medications: Current  Meds  Medication Sig   acetaminophen (TYLENOL) 325 MG tablet Take 1-2 tablets (325-650 mg total) by mouth every 4 (four) hours as needed for mild pain.   amLODipine (NORVASC) 10 MG tablet Take 1 tablet (10 mg total) by mouth every morning.   aspirin 81 MG EC tablet Take 1 tablet (81 mg total) by mouth daily. Swallow whole.   carvedilol (COREG) 12.5 MG tablet Take 1 tablet (12.5 mg total) by mouth 2 (two) times daily with a meal.   cholecalciferol (VITAMIN D) 1000 units tablet Take 1 tablet (1,000 Units total) by mouth daily.   citalopram (CELEXA) 20 MG tablet Take 1 tablet (20 mg total) by mouth daily.   clopidogrel (PLAVIX) 75 MG tablet 1st dose take 300 mg then 75 mg daily   Cyanocobalamin (VITAMIN B-12) 2500 MCG SUBL Place 1 tablet (2,500 mcg total) under the tongue daily.   cyclobenzaprine (FLEXERIL) 10 MG tablet Take 1 tablet (10 mg total) by mouth at bedtime.   diclofenac Sodium (VOLTAREN) 1 % GEL Apply 2 g topically 4 (four) times daily. (Patient taking differently: Apply 2 g topically 3 (three) times daily as needed (pain).)   Evolocumab (REPATHA SURECLICK) 793 MG/ML SOAJ Inject 1 pen. into the skin every 14 (fourteen) days.   folic acid (FOLVITE) 1 MG tablet Take 1 mg by mouth daily.   insulin glargine (LANTUS) 100 UNIT/ML Solostar Pen Inject 12 Units into the skin 2 (two) times daily. (Patient taking differently: Inject 14 Units into the skin 2 (two) times daily.)   insulin lispro (HUMALOG) 100 UNIT/ML KwikPen Inject 5 Units into the skin every evening.   lisinopril (ZESTRIL) 40 MG tablet Take 1 tablet (40 mg total) by mouth every morning.   metFORMIN (GLUCOPHAGE) 500 MG tablet Take 2 tablets (1,000 mg total) by mouth 2 (two) times daily with a meal.   methotrexate (RHEUMATREX) 2.5 MG tablet Take 6 tablets (15 mg total) by mouth once a week. Caution:Chemotherapy. Protect from light.   Multiple Vitamins-Minerals (MULTIVITAMINS THER. W/MINERALS) TABS Take 1 tablet by mouth daily.    nitroGLYCERIN (NITROSTAT) 0.4 MG SL tablet Place 1 tablet (0.4 mg total) under the tongue every 5 (five) minutes x 3 doses as needed for chest pain.   Omega-3 Fatty Acids (FISH OIL) 1200 MG CAPS Take 1,200 mg by mouth in the morning and at bedtime.   predniSONE (DELTASONE) 10 MG tablet Take 20 mg by mouth daily with breakfast.   [DISCONTINUED] oxyCODONE (OXY IR/ROXICODONE) 5 MG immediate release tablet Take 1 tablet (5 mg total) by mouth every 6 (six) hours as needed for severe pain.   [DISCONTINUED] predniSONE (DELTASONE) 10 MG tablet 4 tabs every morning for one week 3 tabs every morning for one week. Then keep 2 tabs every morning. (Patient taking differently: Take 20 mg by mouth daily. 4 tabs every morning for one week 3 tabs every morning for one week. Then keep 2 tabs every morning.)     Allergies:  Atorvastatin calcium   Social History   Socioeconomic History   Marital status: Widowed    Spouse name: Not on file   Number of children: 2   Years of education: 3 yeae   Highest education level: Not on file  Occupational History   Not on file  Tobacco Use   Smoking status: Never   Smokeless tobacco: Never  Vaping Use   Vaping Use: Never used  Substance and Sexual Activity   Alcohol use: Yes    Alcohol/week: 1.0 standard drink of alcohol    Types: 1 Cans of beer per week    Comment: occasional/ 1 beers week   Drug use: No   Sexual activity: Yes    Birth control/protection: Other-see comments  Other Topics Concern   Not on file  Social History Narrative   Not on file   Social Determinants of Health   Financial Resource Strain: Not on file  Food Insecurity: Not on file  Transportation Needs: Not on file  Physical Activity: Not on file  Stress: Not on file  Social Connections: Not on file    Family History: The patient's family history includes Alzheimer's disease in his mother; Heart attack in his father.  ROS:   Please see the history of present illness.      All other systems reviewed and are negative.  EKGs/Labs/Other Studies Reviewed:    The following studies were reviewed today:  Transthoracic Echocardiogram: Date:02/04/22 Results:   1. Left ventricular ejection fraction, by estimation, is 55%. The  left ventricle has normal function. Left ventricular diastolic parameters  are consistent with Grade I diastolic dysfunction (impaired relaxation).   2. Right ventricular systolic function is normal. The right ventricular  size is normal.   3. No evidence of mitral valve regurgitation.   4. Aortic valve regurgitation is not visualized. Aortic Sclerosis   5. The inferior vena cava is normal in size with greater than 50%  respiratory variability, suggesting right atrial pressure of 3 mmHg.   6. Agitated saline contrast bubble study was negative, with no evidence  of any interatrial shunt.   Left/Right Heart Catheterizations: Date: 12/28/21 Results:   Ost RCA lesion is 80% stenosed.   A drug-eluting stent was successfully placed using a STENT ONYX FRONTIER 4.0X18, postdilated to 4.5 mm in diameter and optimized with intravascular ultrasound.   Post intervention, there is a 0% residual stenosis.   The left ventricular systolic function is normal.   LV end diastolic pressure is normal.   The left ventricular ejection fraction is 50-55% by visual estimate.   There is no aortic valve stenosis.   Successful PCI of the ostial RCA which was calcified.  It was a short focal area of calcification.  The distal reference was over 4 mm.  Proximal and ostial stent postdilated aggressively with a 4.5 [place] balloon.  I stressed the importance of dual antiplatelet therapy and aggressive secondary prevention.   Recent Labs: 12/27/2021: B Natriuretic Peptide 129.0 02/07/2022: Magnesium 2.1 02/14/2022: Platelets 292 03/07/2022: TSH 0.492 05/23/2022: ALT 60; BUN 16; Creatinine, Ser 0.48; Hemoglobin 11.1; Potassium 4.1; Sodium 144  Recent Lipid Panel    Component  Value Date/Time   CHOL 272 (H) 12/28/2021 0259   TRIG 198 (H) 12/28/2021 0259   HDL 41 12/28/2021 0259   CHOLHDL 6.6 12/28/2021 0259   VLDL 40 12/28/2021 0259   LDLCALC 191 (H) 12/28/2021 0259   LDLDIRECT 97.8 02/03/2022 2235       Physical Exam:  VS:  BP 122/68   Pulse 73   Ht '5\' 10"'$  (1.778 m)   Wt 197 lb (89.4 kg)   SpO2 97%   BMI 28.27 kg/m     Wt Readings from Last 3 Encounters:  07/28/22 197 lb (89.4 kg)  05/23/22 187 lb (84.8 kg)  04/19/22 176 lb (79.8 kg)    Gen: No distress    Neck: No JVD Cardiac: No Rubs or Gallops, systolic murmur, RRR +2 radial pulses Respiratory: Clear to auscultation bilaterally, normal effort, normal  respiratory rate GI: Soft, nontender, non-distended  MS: No  edema;  moves all extremities Integument: Skin feels warm Neuro:  At time of evaluation, alert and oriented to person/place/time/situation  Psych: Normal affect, patient feels well  ASSESSMENT:    1. Coronary artery disease involving native coronary artery of native heart without angina pectoris   2. Hyperlipidemia, unspecified hyperlipidemia type   3. Myalgia due to statin   4. Mild aortic stenosis   5. Diabetes mellitus with coincident hypertension (HCC)     PLAN:    Coronary Artery Disease; Obstructive HLD with DM Statin Myalgia - asymptomatic  with stable angina on therapy  - anatomy: oRCA - continue ASA 81 mg and plavix 75 until 12/2022 - continue PCSK9i, goal LDL < 55; lipids, ALT today - continue BB (coreg 12.5 mg BID) - continue PRN  nitrates; (None needed)   - continue ACEi (40 mg)  Hypertension with Diabetes Mild AS With prior orthostatic hypotension (resolved) - echo in 2026  April 2024 me or APP       Medication Adjustments/Labs and Tests Ordered: Current medicines are reviewed at length with the patient today.  Concerns regarding medicines are outlined above.  Orders Placed This Encounter  Procedures   ALT   Lipid panel   No orders of the  defined types were placed in this encounter.   Patient Instructions  Medication Instructions:  Your physician recommends that you continue on your current medications as directed. Please refer to the Current Medication list given to you today.  *If you need a refill on your cardiac medications before your next appointment, please call your pharmacy*  Lab Work: TODAY: lipid panel, ALT If you have labs (blood work) drawn today and your tests are completely normal, you will receive your results only by: [place] (if you have MyChart) OR A paper copy in the mail If you have any lab test that is abnormal or we need to change your treatment, we will call you to review the results.  Testing/Procedures: NONE  Follow-Up: At [facility], you and your health needs are our priority.  As part of our continuing mission to provide you with exceptional heart care, we have created designated Provider Care Teams.  These Care Teams include your primary Cardiologist (physician) and Advanced Practice Providers (APPs -  Physician Assistants and Nurse Practitioners) who all work together to provide you with the care you need, when you need it.  Your next appointment:   6 month(s)  The format for your next appointment:   In Person  Provider:   Werner Lean, MD  or Robbie Lis, PA-C, Nicholes Rough, PA-C, Ambrose Pancoast, NP, Christen Bame, NP, or Richardson Dopp, PA-C   Important Information About Sugar         Signed, Werner Lean, MD  07/28/2022 9:16 AM    [place]

## 2022-08-08 DIAGNOSIS — R748 Abnormal levels of other serum enzymes: Secondary | ICD-10-CM | POA: Diagnosis not present

## 2022-08-12 DIAGNOSIS — J069 Acute upper respiratory infection, unspecified: Secondary | ICD-10-CM | POA: Diagnosis not present

## 2022-08-26 ENCOUNTER — Ambulatory Visit: Payer: Medicare Other | Admitting: Orthopedic Surgery

## 2022-09-12 DIAGNOSIS — E7800 Pure hypercholesterolemia, unspecified: Secondary | ICD-10-CM | POA: Diagnosis not present

## 2022-09-12 DIAGNOSIS — M609 Myositis, unspecified: Secondary | ICD-10-CM | POA: Diagnosis not present

## 2022-09-12 DIAGNOSIS — Z794 Long term (current) use of insulin: Secondary | ICD-10-CM | POA: Diagnosis not present

## 2022-09-12 DIAGNOSIS — Z0000 Encounter for general adult medical examination without abnormal findings: Secondary | ICD-10-CM | POA: Diagnosis not present

## 2022-09-12 DIAGNOSIS — E1169 Type 2 diabetes mellitus with other specified complication: Secondary | ICD-10-CM | POA: Diagnosis not present

## 2022-09-12 DIAGNOSIS — I10 Essential (primary) hypertension: Secondary | ICD-10-CM | POA: Diagnosis not present

## 2022-09-22 ENCOUNTER — Ambulatory Visit: Payer: Medicare Other | Admitting: Neurology

## 2022-09-28 DIAGNOSIS — E119 Type 2 diabetes mellitus without complications: Secondary | ICD-10-CM | POA: Diagnosis not present

## 2022-10-04 ENCOUNTER — Ambulatory Visit (INDEPENDENT_AMBULATORY_CARE_PROVIDER_SITE_OTHER): Payer: Medicare Other | Admitting: Neurology

## 2022-10-04 ENCOUNTER — Encounter: Payer: Self-pay | Admitting: Neurology

## 2022-10-04 VITALS — BP 129/68 | HR 71 | Ht 70.0 in | Wt 190.0 lb

## 2022-10-04 DIAGNOSIS — G7249 Other inflammatory and immune myopathies, not elsewhere classified: Secondary | ICD-10-CM | POA: Diagnosis not present

## 2022-10-04 MED ORDER — PREDNISONE 10 MG PO TABS: 20.0000 mg | ORAL_TABLET | Freq: Every day | ORAL | 3 refills | Status: DC

## 2022-10-04 NOTE — Progress Notes (Unsigned)
Chief Complaint  Patient presents with   Follow-up    Rm 13, alone States he is stable, would like to discuss vaccine safety for prevnar, HEP A and B      ASSESSMENT AND PLAN  REMIJIO Gutierrez is a 67 y.o. male   Inflammatory myopathy  Significantly elevated CPK, diagnosis was made shortly after his heart attack and stroke in 2023, at his worst, he has gait abnormality, intermittent mild swallowing difficulty  Responding well to prednisone treatment, started as 80 mg since May 2023, with significant side effect, poorly controlled diabetes,  Prednisone has been decreased to 20 mg daily since beginning of August 2023, tolerating it better,  Methotrexate 2.5 mg 6 tablets since June 2023,  IVIG, loading dose since August 2023, maintenance dose 1 g/kg every 3 to 4 weeks, tolerating it well,  Muscle biopsy of left vastus lateralis muscle on April 19, 2022 showed no clear necrosis, inflammation, phagocytosis, there were rare regenerating fibers, type II fiber predominant, with evidence of atrophic fibers   He responded very well to current combination therapy, but continue has mild to moderate proximal muscle weakness  He runs out of prednisone 20 mg daily in December 2023 stopped it abruptly without notifying office, he noticed mild worsening of weakness, mild gait abnormality, but also has right artificial knee, left knee pain  Restart prednisone 20 mg daily, keep current dose of methotrexate 2.5 mg 6 tablets, IVIG 1 g/kg every 3 to 4 weeks,   Mild gait abnormality  Likely combination of proximal muscle weakness, residual mild left hemiparesis from right pontine stroke, right artificial knee, left knee pain,   Right pontine stroke Coronary artery disease  With mild residual left upper and lower extremity weakness, hyperreflexia on the left side,  Vascular risk factor of aging, hypertension, hyperlipidemia, diabetes  On aspirin and Plavix     Return To Clinic With NP In 3 Months depend  on his CPK level, may consider taper down prednisone,  DIAGNOSTIC DATA (LABS, IMAGING, TESTING) - I reviewed patient records, labs, notes, testing and imaging myself where available. Addendum, patient was seen by Dr. Beryle Lathe Cornett on April 01, 2022, pathology reported no clear necrosis, inflammation, phagocytosis, there were rare regenerating fibers, type II fiber predominant, with evidence of atrophic fibers  MEDICAL HISTORY:  [name] is a 67 year old male, seen in request by his primary care physician Dr. Greig Right, to follow-up for stroke, inflammatory myopathy, initial evaluation was with her daughter on March 07, 2022,  I reviewed and summarized the referring note. PMHx. HTN CAD, April 2023, was on brilinta DM since 2017  He has been physically active, suffered heart attack in April 2023 while pushing mowing his lawn, was noted to have elevated troponin, underwent stent, double antiplatelet treatment aspirin and Brilinta  During that hospital admission, he was found to have elevated abnormal liver functional test, progressive weakness since the end of 2022, his atorvastatin 20 mg was on hold since then  Hospital admission for stroke on Feb 02, 2022 for weakness, to the point his father has to help him to get into the car, laboratory evaluation showed CPK 7291, AST 181, ALT  CT of the chest abdomen and pelvis with contrast negative for evidence of metastatic disease, extensive coronary artery calcification  He was seen by neurologist, with his presentation of symmetric bilateral upper and lower extremity weakness, consistent with inflammatory myositis  MRI of the brain showed acute infarction involving ventral pontine  CT  angiogram of head and neck showed right ICA stenosis measuring at least 70% proximally, 70% at the paraclinoid segment, 50% left internal carotid artery stenosis, bilateral vertebral origin stenosis measuring 50% on the right 40% on the  left  Laboratory evaluations in June 2023, CPK 991, CBC showed elevated WBC 21.7 hemoglobin 13.7, BMP creatinine 0.5,  HMGCR ig antibody was significantly elevated 97 units, which may be associated with necrotizing autoimmune myopathy in a subset of statin treated patients, although infrequent, this antibody may also be observed in statin nave patient with necrotizing autoimmune myopathy,  Normal magnesium, phosphate, elevated ALT 262, AST 65, A1c 7.4, LDL was 97.8, normal TSH, negative HIV, negative D-dimer, lipase,  MRI of cervical spine showed multilevel degenerative changes, no significant canal stenosis, C3-4 severe left, C5-6 severe right, C6-7 severe left and moderate right neuroforaminal stenosis  He was treated with Solu-Medrol 1 g daily for 3 days followed by p.o. prednisone tapering starting from 80 mg daily, decrement of 10 mg every 1 week, will be taking 50 mg March 08, 2022  Case was discussed with Dr. Saverio Danker of general surgeon, planning on having muscle biopsy, he was discharged to rehabilitation, follow-up visit did showed improvement of his muscle weakness, was able to ambulate, the response to steroid also favored the diagnosis of inflammatory myopathy, he was discharged to inpatient rehabilitation, discharged on Feb 16, 2022, he now lives alone at home, ambulated with a cane  Emergency presentation 3 days later Feb 19, 2022, fell, hit his left frontal region on the back,  CT head showed no acute abnormality  He was not on any antiplatelet agent prior to hospital admission, now taking aspirin and Plavix, he has some improvement, but far from his baseline, has difficulty raising arm overhead, could not put plate in the cabinet, gait abnormality,  UPDATE August 28th 2023: He is with his son at today's clinical visit, left breast symptomatic for muscle biopsy on April 19, 2022, showed minimum abnormal muscles suggestive of necrosis without clear inflammation, some lymphocyte  as seen surrounding muscle fibers,  He received IVIG at the beginning of August 2023, tolerating it well, maintenance 1 g/kg is scheduled for August 29/30,  Methotrexate 2.5 mg 6 tablets every Monday, along with folic acid, tolerating it well  Prednisone has been decreased to 20 mg daily since August 2023, no longer have significant side effect  He reported 75% back to baseline, ambulating without cane, still mild unsteady gait, need push-up to get up from seated position, better range of motion of upper extremity, water therapy has been helpful  UPDATE Oct 04 2022: He run out of prednisone '20mg'$   2 weeks prior to visit, stop it abruptly, he noticed mily change, balance is off, dizzy, light headed,  knees are weaker, arms are strong, no bulbar weakness,    PHYSICAL EXAM:   Vitals:   10/04/22 0908  BP: 129/68  Pulse: 71  Weight: 190 lb (86.2 kg)  Height: '5\' 10"'$  (1.778 m)     Body mass index is 27.26 kg/m.  PHYSICAL EXAMNIATION:  Gen: NAD, conversant, well nourised, well groomed                     Cardiovascular: Regular rate rhythm, no peripheral edema, warm, nontender. Eyes: Conjunctivae clear without exudates or hemorrhage Neck: Supple, no carotid bruits. Pulmonary: Clear to auscultation bilaterally   NEUROLOGICAL EXAM:  MENTAL STATUS: Speech/cognition: Awake, alert, oriented to history taking and casual conversation CRANIAL NERVES: CN  II: Visual fields are full to confrontation. Pupils are round equal and briskly reactive to light. CN III, IV, VI: extraocular movement are normal. No ptosis. CN V: Facial sensation is intact to light touch CN VII: Face is symmetric with normal eye closure  CN VIII: Hearing is normal to causal conversation. CN IX, X: Phonation is normal. CN XI: Head turning and shoulder shrug are intact  MOTOR: Limited range of motion of right shoulder, due to right shoulder rotator cuff injury, mild neck flexion weakness UE Shoulder Abduction  Shoulder External Rotation Elbow Flexion Elbow  Extension Wrist Flexion Wrist Extension Grip  R '4 4 4 4 5 5 5  '$ L '4 4 4 4 5 5 5   '$ LE Hip Flexion Knee flexion Knee extension Ankle Dorsiflexion  R 4 4+ 5 5  L 4 4+ 5 5     REFLEXES: Reflexes are mildly hyperreflexia on left upper extremity, patella, bilateral Babinski sign   SENSORY: Intact to light touch, pinprick and vibratory sensation are intact in fingers and toes.  COORDINATION: There is no trunk or limb dysmetria noted.  GAIT/STANCE: Can get up from seated position arm crossed with effort, wide-based, cautious gait  REVIEW OF SYSTEMS:  Full 14 system review of systems performed and notable only for as above All other review of systems were negative.   ALLERGIES: Allergies  Allergen Reactions   Atorvastatin Calcium Other (See Comments)    Statin induced myopathy and transaminitis    HOME MEDICATIONS: Current Outpatient Medications  Medication Sig Dispense Refill   acetaminophen (TYLENOL) 325 MG tablet Take 1-2 tablets (325-650 mg total) by mouth every 4 (four) hours as needed for mild pain.     amLODipine (NORVASC) 10 MG tablet Take 1 tablet (10 mg total) by mouth every morning. 30 tablet 0   aspirin 81 MG EC tablet Take 1 tablet (81 mg total) by mouth daily. Swallow whole. 90 tablet 3   carvedilol (COREG) 12.5 MG tablet Take 1 tablet (12.5 mg total) by mouth 2 (two) times daily with a meal. 60 tablet 11   cholecalciferol (VITAMIN D) 1000 units tablet Take 1 tablet (1,000 Units total) by mouth daily. 30 tablet 0   citalopram (CELEXA) 20 MG tablet Take 1 tablet (20 mg total) by mouth daily. 30 tablet 0   clopidogrel (PLAVIX) 75 MG tablet 1st dose take 300 mg then 75 mg daily 94 tablet 3   Cyanocobalamin (VITAMIN B-12) 2500 MCG SUBL Place 1 tablet (2,500 mcg total) under the tongue daily. 30 tablet 0   cyclobenzaprine (FLEXERIL) 10 MG tablet Take 1 tablet (10 mg total) by mouth at bedtime. 30 tablet 0   diclofenac  Sodium (VOLTAREN) 1 % GEL Apply 2 g topically 4 (four) times daily. (Patient taking differently: Apply 2 g topically 3 (three) times daily as needed (pain).) 100 g 0   Evolocumab (REPATHA SURECLICK) 824 MG/ML SOAJ Inject 1 pen. into the skin every 14 (fourteen) days. 2 mL 11   folic acid (FOLVITE) 1 MG tablet Take 1 mg by mouth daily.     insulin glargine (LANTUS) 100 UNIT/ML Solostar Pen Inject 12 Units into the skin 2 (two) times daily. (Patient taking differently: Inject 14 Units into the skin 2 (two) times daily.) 15 mL 11   insulin lispro (HUMALOG) 100 UNIT/ML KwikPen Inject 5 Units into the skin every evening.     lisinopril (ZESTRIL) 40 MG tablet Take 1 tablet (40 mg total) by mouth every morning. 30 tablet 0  metFORMIN (GLUCOPHAGE) 500 MG tablet Take 2 tablets (1,000 mg total) by mouth 2 (two) times daily with a meal. 60 tablet 0   methotrexate (RHEUMATREX) 2.5 MG tablet Take 6 tablets (15 mg total) by mouth once a week. Caution:Chemotherapy. Protect from light. 30 tablet 11   Multiple Vitamins-Minerals (MULTIVITAMINS THER. W/MINERALS) TABS Take 1 tablet by mouth daily.     nitroGLYCERIN (NITROSTAT) 0.4 MG SL tablet Place 1 tablet (0.4 mg total) under the tongue every 5 (five) minutes x 3 doses as needed for chest pain. 25 tablet 12   Omega-3 Fatty Acids (FISH OIL) 1200 MG CAPS Take 1,200 mg by mouth in the morning and at bedtime.     predniSONE (DELTASONE) 10 MG tablet Take 20 mg by mouth daily with breakfast.     No current facility-administered medications for this visit.    PAST MEDICAL HISTORY: Past Medical History:  Diagnosis Date   Aortic stenosis    mild AS 02/04/22   Arthritis    Diabetes mellitus    fasting 120s   History of kidney stones    Hypertension    Myocardial infarction ([place])    stent placed   Peripheral neuropathy    left fifth finger, left toe   Pneumonia    hx of    PAST SURGICAL HISTORY: Past Surgical History:  Procedure Laterality Date    APPENDECTOMY     CORONARY STENT INTERVENTION N/A 12/28/2021   Procedure: CORONARY STENT INTERVENTION;  Surgeon: Jettie Booze, MD;  Location: [facility];  Service: Cardiovascular;  Laterality: N/A;   CYSTOSCOPY W/ URETERAL STENT PLACEMENT Bilateral 03/17/2014   Procedure: CYSTOSCOPY WITH RIGHT STENT PULL  ;  Surgeon: Alexis Frock, MD;  [facility];  Service: Urology;  Laterality: Bilateral;   CYSTOSCOPY WITH RETROGRADE PYELOGRAM, URETEROSCOPY AND STENT PLACEMENT Bilateral 02/01/2014   Procedure: CYSTOSCOPY WITH BILATERAL RETROGRADE CYSTOSCOPY, BILATERAL RETROGRADE, PYELOGRAM, RIGHT URETEROSCOPY, STONE BASKETRY,  AND BILATERAL STENT PLACEMENT;  Surgeon: Alexis Frock, MD;  [facility];  Service: Urology;  Laterality: Bilateral;   CYSTOSCOPY WITH URETEROSCOPY AND STENT PLACEMENT Left 02/12/2021   Procedure: CYSTOSCOPY WITH LEFT URETEROSCOPY, BALLOON DILITATION, HOLMIUM LASER LITHOTRIPSY AND STENT PLACEMENT;  Surgeon: Lucas Mallow, MD;  [facility];  Service: Urology;  Laterality: Left;   HOLMIUM LASER APPLICATION Left 1/77/9390   Procedure: HOLMIUM LASER APPLICATION;  Surgeon: Alexis Frock, MD;  [facility];  Service: Urology;  Laterality: Left;   INTRAVASCULAR ULTRASOUND/IVUS N/A 12/28/2021   Procedure: Intravascular Ultrasound/IVUS;  Surgeon: Jettie Booze, MD;  Location: [facility];  Service: Cardiovascular;  Laterality: N/A;   LEFT HEART CATH AND CORONARY ANGIOGRAPHY N/A 12/28/2021   Procedure: LEFT HEART CATH AND CORONARY ANGIOGRAPHY;  Surgeon: Jettie Booze, MD;  Location: [facility];  Service: Cardiovascular;  Laterality: N/A;   LITHOTRIPSY     MUSCLE BIOPSY Left 04/19/2022   Procedure: LEFT THIGH MUSCLE BIOPSY;  Surgeon: Erroll Luna, MD;  Location: [facility];  Service: General;  Laterality: Left;   NEPHROLITHOTOMY Left 03/17/2014   Procedure: NEPHROLITHOTOMY PERCUTANEOUS with access;  Surgeon: Alexis Frock, MD;  [facility];  Service: Urology;  Laterality: Left;   NEPHROLITHOTOMY Left 03/19/2014   Procedure: 2ND STAGE NEPHROLITHOTOMY PERCUTANEOUS left ureteroscopy with lasr stone treatment and stent placement, with access;  Surgeon: Alexis Frock, MD;  [facility];  Service: Urology;  Laterality: Left;   TOTAL KNEE ARTHROPLASTY Right 09/02/2014   Procedure: RIGHT TOTAL KNEE  ARTHROPLASTY;  Surgeon: Meredith Pel, MD;  Location: [facility];  Service: Orthopedics;  Laterality: Right;   uretral stent      FAMILY HISTORY: Family History  Problem Relation Age of Onset   Alzheimer's disease Mother    Heart attack Father     SOCIAL HISTORY: Social History   Socioeconomic History   Marital status: Widowed    Spouse name: Not on file   Number of children: 2   Years of education: 3 yeae   Highest education level: Not on file  Occupational History   Not on file  Tobacco Use   Smoking status: Never   Smokeless tobacco: Never  Vaping Use   Vaping Use: Never used  Substance and Sexual Activity   Alcohol use: Yes    Alcohol/week: 1.0 standard drink of alcohol    Types: 1 Cans of beer per week    Comment: occasional/ 1 beers week   Drug use: No   Sexual activity: Yes    Birth control/protection: Other-see comments  Other Topics Concern   Not on file  Social History Narrative   Not on file   Social Determinants of Health   Financial Resource Strain: Not on file  Food Insecurity: Not on file  Transportation Needs: Not on file  Physical Activity: Not on file  Stress: Not on file  Social Connections: Not on file  Intimate Partner Violence: Not on file      Marcial Pacas, M.D. Ph.D.  [facility] [address] [place], [place] [address] Ph: [phone] Fax: [phone]  CC:  Greig Right, MD [place],  [place] [address]  Greig Right, MD

## 2022-10-05 LAB — CK: Total CK: 244 U/L (ref 41–331)

## 2022-10-05 LAB — TSH: TSH: 1.05 u[IU]/mL (ref 0.450–4.500)

## 2022-10-17 ENCOUNTER — Telehealth: Payer: Self-pay | Admitting: Pharmacist

## 2022-10-17 NOTE — Telephone Encounter (Signed)
Jenevieve from [employer] called about pt PA, she states she needs to verify pt name and ID number.

## 2022-10-17 NOTE — Telephone Encounter (Signed)
Repatha PA request received from pt's pharmacy. This has been submitted, key [id number].

## 2022-10-17 NOTE — Telephone Encounter (Signed)
This info is already in his prior authorization request, not sure why insurance needs this info again.   [employer], pt first name shows up as [name] on his insurance plan, looks like cover my meds populated name change to this.  Request has to be resubmitted under Meda Klinefelter which is how the request was submitted...apparently in the chart notes pt is under Saafir so our notes need to state that this is the same person. Member ID is [id number].

## 2022-10-18 NOTE — Telephone Encounter (Signed)
New PA submitted, we only have [employer] Part D info on file, new request submitted to this insurance Key: [id number]. Looks like other insurance is an IT consultant which is usually run as Consulting civil engineer.

## 2022-10-19 MED ORDER — REPATHA SURECLICK 140 MG/ML ~~LOC~~ SOAJ: 1.0000 | SUBCUTANEOUS | 11 refills | Status: DC

## 2022-10-19 NOTE — Telephone Encounter (Signed)
Received message that prior [name] was canceled and that pt already has valid prior auth on file through 02/03/23. Will send in refill.  Called his pharmacy, they didn't have his Part D insurance on file. They reran rx and copay is $45. Would not run through both insurance plans without a secondary PA. I have submitted this again as well, Key: [id number]. Will have pharmacy try to rerun through both insurances once approved to help lower pt's copay.

## 2022-10-20 NOTE — Telephone Encounter (Signed)
PA for Repatha from [place] through 10/20/23. I called pharmacy to have them re-run it. Saying "bad other coverage" Pharmacist states that he will try to figure out why it isnt going through. Patient made aware that both PA were approved and his pharmacy was working on the issue. He was advised to follow up with his pharmacy.

## 2022-10-26 ENCOUNTER — Ambulatory Visit (INDEPENDENT_AMBULATORY_CARE_PROVIDER_SITE_OTHER): Payer: Medicare Other | Admitting: Orthopedic Surgery

## 2022-10-26 ENCOUNTER — Encounter: Payer: Self-pay | Admitting: Orthopedic Surgery

## 2022-10-26 DIAGNOSIS — M25512 Pain in left shoulder: Secondary | ICD-10-CM | POA: Diagnosis not present

## 2022-10-26 DIAGNOSIS — M25511 Pain in right shoulder: Secondary | ICD-10-CM | POA: Diagnosis not present

## 2022-10-26 NOTE — Progress Notes (Signed)
Office Visit Note   Patient: [name]           Date of Birth: 12/27/1955           MRN: [id number] Visit Date: 10/26/2022 Requested by: Greig Right, MD [address] [place],  [place] [address] PCP: Greig Right, MD  Subjective: Chief Complaint  Patient presents with   Shoulder Pain    HPI: [name] is a 67 y.o. male who presents to the office reporting right shoulder pain.  Last clinic visit demonstrated mild pain with reasonable range of motion.  MRI scan shows partial-thickness rotator cuff tear.  Pain does not wake him from sleep at night.  Hard for him to push himself up out of bed.  He has lost some muscle mass and currently is being treated for this syndrome with IVIG 2 days a week.  He also is on prednisone 20 mg a day.  He has gone through physical therapy..                ROS: All systems reviewed are negative as they relate to the chief complaint within the history of present illness.  Patient denies fevers or chills.  Assessment & Plan: Visit Diagnoses:  1. Bilateral shoulder pain, unspecified chronicity     Plan: Impression is partial-thickness rotator cuff tear on the right shoulder.  Slight weakness on exam 5- out of 5 which is comparable to 4 months ago.  Plan at this time is observation.  I think he has some mitigating factors arguing against surgery at this time and on exam I do not think this is a full-thickness tear yet.  Counseled him that if he has significantly increased pain or if he feels a pop in that shoulder he should come back for evaluation.  Overall for now though I think it is a good plan to observe.  Follow-up as needed.  Follow-Up Instructions: No follow-ups on file.   Orders:  No orders of the defined types were placed in this encounter.  No orders of the defined types were placed in this encounter.     Procedures: No procedures performed   Clinical Data: No additional findings.  Objective: Vital Signs:  There were no vitals taken for this visit.  Physical Exam:  Constitutional: Patient appears well-developed HEENT:  Head: Normocephalic Eyes:EOM are normal Neck: Normal range of motion Cardiovascular: Normal rate Pulmonary/chest: Effort normal Neurologic: Patient is alert Skin: Skin is warm Psychiatric: Patient has normal mood and affect  Ortho Exam: Ortho exam demonstrates range of motion of 60/100/170.'s 5- out of 5 external rotation weakness on the right compared to the left but no coarse grinding or popping with internal and external rotation at 90 degrees of abduction and no AC joint tenderness.  Specialty Comments:  No specialty comments available.  Imaging: No results found.   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   Elevated CK 02/02/2022   Elevated LFTs 02/02/2022   CAD (coronary artery disease) 02/02/2022   Muscle weakness of all 4 extremities 02/02/2022   Hyperlipidemia 01/28/2022   Arthritis of knee 09/02/2014   [place] kidney stones 03/17/2014   Nephrolithiasis 02/01/2014  Pneumonia 08/27/2011   Diabetes mellitus with coincident hypertension ([place]) 08/27/2011   Past Medical History:  Diagnosis Date   Aortic stenosis    mild AS 02/04/22   Arthritis    Diabetes mellitus    fasting 120s   History of kidney stones    Hypertension    Myocardial infarction ([place])    stent placed   Peripheral neuropathy    left fifth finger, left toe   Pneumonia    hx of    Family History  Problem Relation Age of Onset   Alzheimer's disease Mother    Heart attack Father     Past Surgical History:  Procedure Laterality Date    APPENDECTOMY     CORONARY STENT INTERVENTION N/A 12/28/2021   Procedure: CORONARY STENT INTERVENTION;  Surgeon: Jettie Booze, MD;  Location: [facility];  Service: Cardiovascular;  Laterality: N/A;   CYSTOSCOPY W/ URETERAL STENT PLACEMENT Bilateral 03/17/2014   Procedure: CYSTOSCOPY WITH RIGHT STENT PULL  ;  Surgeon: Alexis Frock, MD;  [facility];  Service: Urology;  Laterality: Bilateral;   CYSTOSCOPY WITH RETROGRADE PYELOGRAM, URETEROSCOPY AND STENT PLACEMENT Bilateral 02/01/2014   Procedure: CYSTOSCOPY WITH BILATERAL RETROGRADE CYSTOSCOPY, BILATERAL RETROGRADE, PYELOGRAM, RIGHT URETEROSCOPY, STONE BASKETRY,  AND BILATERAL STENT PLACEMENT;  Surgeon: Alexis Frock, MD;  [facility];  Service: Urology;  Laterality: Bilateral;   CYSTOSCOPY WITH URETEROSCOPY AND STENT PLACEMENT Left 02/12/2021   Procedure: CYSTOSCOPY WITH LEFT URETEROSCOPY, BALLOON DILITATION, HOLMIUM LASER LITHOTRIPSY AND STENT PLACEMENT;  Surgeon: Lucas Mallow, MD;  [facility];  Service: Urology;  Laterality: Left;   HOLMIUM LASER APPLICATION Left 7/67/3419   Procedure: HOLMIUM LASER APPLICATION;  Surgeon: Alexis Frock, MD;  [facility];  Service: Urology;  Laterality: Left;   INTRAVASCULAR ULTRASOUND/IVUS N/A 12/28/2021   Procedure: Intravascular Ultrasound/IVUS;  Surgeon: Jettie Booze, MD;  Location: [facility];  Service: Cardiovascular;  Laterality: N/A;   LEFT HEART CATH AND CORONARY ANGIOGRAPHY N/A 12/28/2021   Procedure: LEFT HEART CATH AND CORONARY ANGIOGRAPHY;  Surgeon: Jettie Booze, MD;  Location: [facility];  Service: Cardiovascular;  Laterality: N/A;   LITHOTRIPSY     MUSCLE BIOPSY Left 04/19/2022   Procedure: LEFT THIGH MUSCLE BIOPSY;  Surgeon: Erroll Luna, MD;  Location: [facility];  Service: General;  Laterality: Left;   NEPHROLITHOTOMY Left 03/17/2014   Procedure: NEPHROLITHOTOMY PERCUTANEOUS with access;  Surgeon: Alexis Frock, MD;  [facility];  Service: Urology;  Laterality: Left;   NEPHROLITHOTOMY Left 03/19/2014   Procedure: 2ND STAGE NEPHROLITHOTOMY PERCUTANEOUS left ureteroscopy with lasr stone treatment and stent placement, with access;  Surgeon: Alexis Frock, MD;  [facility];  Service: Urology;  Laterality: Left;   TOTAL KNEE ARTHROPLASTY Right 09/02/2014   Procedure: RIGHT TOTAL KNEE ARTHROPLASTY;  Surgeon: Meredith Pel, MD;  Location: [facility];  Service: Orthopedics;  Laterality: Right;   uretral stent     Social History   Occupational History   Not on file  Tobacco Use   Smoking status: Never   Smokeless tobacco: Never  Vaping Use   Vaping Use: Never used  Substance and Sexual Activity   Alcohol use: Yes    Alcohol/week: 1.0 standard drink of alcohol    Types: 1 Cans of beer per week    Comment: occasional/ 1 beers week   Drug use: No   Sexual activity: Yes    Birth control/protection: Other-see comments

## 2022-11-02 DIAGNOSIS — G7249 Other inflammatory and immune myopathies, not elsewhere classified: Secondary | ICD-10-CM | POA: Diagnosis not present

## 2022-11-03 DIAGNOSIS — G7249 Other inflammatory and immune myopathies, not elsewhere classified: Secondary | ICD-10-CM | POA: Diagnosis not present

## 2022-11-17 DIAGNOSIS — R3911 Hesitancy of micturition: Secondary | ICD-10-CM | POA: Diagnosis not present

## 2022-11-17 DIAGNOSIS — N5201 Erectile dysfunction due to arterial insufficiency: Secondary | ICD-10-CM | POA: Diagnosis not present

## 2022-11-17 DIAGNOSIS — C61 Malignant neoplasm of prostate: Secondary | ICD-10-CM | POA: Diagnosis not present

## 2022-11-17 DIAGNOSIS — N401 Enlarged prostate with lower urinary tract symptoms: Secondary | ICD-10-CM | POA: Diagnosis not present

## 2022-12-12 DIAGNOSIS — R3911 Hesitancy of micturition: Secondary | ICD-10-CM | POA: Diagnosis not present

## 2022-12-12 DIAGNOSIS — R3915 Urgency of urination: Secondary | ICD-10-CM | POA: Diagnosis not present

## 2022-12-12 DIAGNOSIS — R350 Frequency of micturition: Secondary | ICD-10-CM | POA: Diagnosis not present

## 2022-12-25 ENCOUNTER — Emergency Department (HOSPITAL_BASED_OUTPATIENT_CLINIC_OR_DEPARTMENT_OTHER): Payer: Medicare Other | Admitting: Radiology

## 2022-12-25 ENCOUNTER — Other Ambulatory Visit: Payer: Self-pay

## 2022-12-25 ENCOUNTER — Encounter (HOSPITAL_BASED_OUTPATIENT_CLINIC_OR_DEPARTMENT_OTHER): Payer: Self-pay

## 2022-12-25 ENCOUNTER — Emergency Department (HOSPITAL_BASED_OUTPATIENT_CLINIC_OR_DEPARTMENT_OTHER)
Admission: EM | Admit: 2022-12-25 | Discharge: 2022-12-25 | Disposition: A | Discharge status: Home / Self Care | Payer: Medicare Other | Attending: Emergency Medicine | Admitting: Emergency Medicine

## 2022-12-25 ENCOUNTER — Emergency Department (HOSPITAL_BASED_OUTPATIENT_CLINIC_OR_DEPARTMENT_OTHER): Payer: Medicare Other

## 2022-12-25 DIAGNOSIS — Z7982 Long term (current) use of aspirin: Secondary | ICD-10-CM | POA: Insufficient documentation

## 2022-12-25 DIAGNOSIS — Z7984 Long term (current) use of oral hypoglycemic drugs: Secondary | ICD-10-CM | POA: Insufficient documentation

## 2022-12-25 DIAGNOSIS — E119 Type 2 diabetes mellitus without complications: Secondary | ICD-10-CM | POA: Diagnosis not present

## 2022-12-25 DIAGNOSIS — S42211A Unspecified displaced fracture of surgical neck of right humerus, initial encounter for closed fracture: Secondary | ICD-10-CM | POA: Diagnosis not present

## 2022-12-25 DIAGNOSIS — Z794 Long term (current) use of insulin: Secondary | ICD-10-CM | POA: Diagnosis not present

## 2022-12-25 DIAGNOSIS — W19XXXA Unspecified fall, initial encounter: Secondary | ICD-10-CM

## 2022-12-25 DIAGNOSIS — S4991XA Unspecified injury of right shoulder and upper arm, initial encounter: Secondary | ICD-10-CM | POA: Diagnosis not present

## 2022-12-25 DIAGNOSIS — M4802 Spinal stenosis, cervical region: Secondary | ICD-10-CM | POA: Insufficient documentation

## 2022-12-25 DIAGNOSIS — W010XXA Fall on same level from slipping, tripping and stumbling without subsequent striking against object, initial encounter: Secondary | ICD-10-CM | POA: Insufficient documentation

## 2022-12-25 DIAGNOSIS — S0990XA Unspecified injury of head, initial encounter: Secondary | ICD-10-CM | POA: Insufficient documentation

## 2022-12-25 DIAGNOSIS — I2510 Atherosclerotic heart disease of native coronary artery without angina pectoris: Secondary | ICD-10-CM | POA: Insufficient documentation

## 2022-12-25 DIAGNOSIS — Z79899 Other long term (current) drug therapy: Secondary | ICD-10-CM | POA: Diagnosis not present

## 2022-12-25 DIAGNOSIS — I10 Essential (primary) hypertension: Secondary | ICD-10-CM | POA: Insufficient documentation

## 2022-12-25 DIAGNOSIS — T1490XA Injury, unspecified, initial encounter: Secondary | ICD-10-CM | POA: Diagnosis not present

## 2022-12-25 DIAGNOSIS — Z7902 Long term (current) use of antithrombotics/antiplatelets: Secondary | ICD-10-CM | POA: Diagnosis not present

## 2022-12-25 MED ORDER — OXYCODONE-ACETAMINOPHEN 5-325 MG PO TABS
1.0000 | ORAL_TABLET | Freq: Once | ORAL | Status: AC
  Administered 2022-12-25: 1 via ORAL
  Filled 2022-12-25: qty 1

## 2022-12-25 MED ORDER — OXYCODONE HCL 5 MG PO TABS: 5.0000 mg | ORAL_TABLET | Freq: Four times a day (QID) | ORAL | 0 refills | Status: DC | PRN

## 2022-12-25 MED ORDER — SENNOSIDES-DOCUSATE SODIUM 8.6-50 MG PO TABS: 1.0000 | ORAL_TABLET | Freq: Every day | ORAL | 0 refills | Status: AC

## 2022-12-25 NOTE — ED Provider Notes (Signed)
[facility] Provider Note   CSN: [id number] Arrival date & time: 12/25/22  1425     History  Chief Complaint  Patient presents with   [name] is a 67 y.o. male.  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  Patient states he was in the garage, stubbed his toe and caused him to trip and fall.  He states that he landed on his right shoulder and thinks that he may have hit his head.  He denies any loss of consciousness.  He states he is having significant pain in his shoulder.  He denies any numbness or weakness.  He denies any nausea or vomiting.  He denies any other pain or injuries after the fall.  The history is provided by the patient and the spouse.  Fall       Home Medications Prior to Admission medications   Medication Sig Start Date End Date Taking? Authorizing Provider  oxyCODONE (ROXICODONE) 5 MG immediate release tablet Take 1 tablet (5 mg total) by mouth every 6 (six) hours as needed for severe pain. 12/25/22  Yes Maylon Peppers, [place], DO  senna-docusate (SENOKOT-S) 8.6-50 MG tablet Take 1 tablet by mouth daily. 12/25/22  Yes Leanord Asal K, DO  acetaminophen (TYLENOL) 325 MG tablet Take 1-2 tablets (325-650 mg total) by mouth every 4 (four) hours as needed for mild pain. 02/16/22   Angiulli, Lavon Paganini, PA-C  amLODipine (NORVASC) 10 MG tablet Take 1 tablet (10 mg total) by mouth every morning. 02/16/22   Angiulli, Lavon Paganini, PA-C  aspirin 81 MG EC tablet Take 1 tablet (81 mg total) by mouth daily. Swallow whole. 12/30/21   Leanor Kail, PA  carvedilol (COREG) 12.5 MG tablet Take 1 tablet (12.5 mg total) by mouth 2 (two) times daily with a meal. 02/16/22   Angiulli, Lavon Paganini, PA-C  cholecalciferol (VITAMIN D) 1000 units tablet Take 1 tablet  (1,000 Units total) by mouth daily. 02/16/22   Angiulli, Lavon Paganini, PA-C  citalopram (CELEXA) 20 MG tablet Take 1 tablet (20 mg total) by mouth daily. 02/16/22   Angiulli, Lavon Paganini, PA-C  clopidogrel (PLAVIX) 75 MG tablet 1st dose take 300 mg then 75 mg daily 02/28/22   Rudean Haskell A, MD  Cyanocobalamin (VITAMIN B-12) 2500 MCG SUBL Place 1 tablet (2,500 mcg total) under the tongue daily. 02/16/22   Angiulli, Lavon Paganini, PA-C  cyclobenzaprine (FLEXERIL) 10 MG tablet Take 1 tablet (10 mg total) by mouth at bedtime. 02/16/22   Angiulli, Lavon Paganini, PA-C  diclofenac Sodium (VOLTAREN) 1 % GEL Apply 2 g topically 4 (four) times daily. Patient taking differently: Apply 2 g topically 3 (three) times daily as needed (pain). 02/16/22   Angiulli, Lavon Paganini, PA-C  Evolocumab (REPATHA SURECLICK) [id number] MG/ML SOAJ Inject 140 mg into the skin every 14 (fourteen) days. 10/19/22   Werner Lean, MD  folic acid (FOLVITE) 1 MG tablet Take 1 mg by mouth daily.    [provider]  insulin glargine (LANTUS) 100 UNIT/ML Solostar Pen Inject 12 Units into the skin 2 (two) times daily. Patient taking differently: Inject 14 Units into the skin 2 (two) times daily. 02/16/22   Angiulli, Lavon Paganini, PA-C  insulin lispro (HUMALOG) 100 UNIT/ML KwikPen Inject 5 Units into the skin every evening. 02/24/22  [provider]  lisinopril (ZESTRIL) 40 MG tablet Take 1 tablet (40 mg total) by mouth every morning. 02/16/22   Angiulli, Lavon Paganini, PA-C  metFORMIN (GLUCOPHAGE) 500 MG tablet Take 2 tablets (1,000 mg total) by mouth 2 (two) times daily with a meal. 02/16/22   Angiulli, Lavon Paganini, PA-C  methotrexate (RHEUMATREX) 2.5 MG tablet Take 6 tablets (15 mg total) by mouth once a week. Caution:Chemotherapy. Protect from light. 03/07/22   Marcial Pacas, MD  Multiple Vitamins-Minerals (MULTIVITAMINS THER. W/MINERALS) TABS Take 1 tablet by mouth daily.    [provider]  nitroGLYCERIN (NITROSTAT) 0.4 MG SL tablet Place 1  tablet (0.4 mg total) under the tongue every 5 (five) minutes x 3 doses as needed for chest pain. 02/16/22   Angiulli, Lavon Paganini, PA-C  Omega-3 Fatty Acids (FISH OIL) 1200 MG CAPS Take 1,200 mg by mouth in the morning and at bedtime.    [provider]  predniSONE (DELTASONE) 10 MG tablet Take 20 mg by mouth daily with breakfast.    [provider]  predniSONE (DELTASONE) 10 MG tablet Take 2 tablets (20 mg total) by mouth daily with breakfast. 10/04/22   Marcial Pacas, MD      Allergies    Atorvastatin calcium    Review of Systems   Review of Systems  Physical Exam Updated Vital Signs BP 115/74   Pulse 78   Temp 98.2 F (36.8 C) (Oral)   Resp 18   Ht 5\' 10"  (1.778 m)   Wt 86.2 kg   SpO2 97%   BMI 27.27 kg/m  Physical Exam Vitals and nursing note reviewed.  Constitutional:      General: He is not in acute distress.    Appearance: Normal appearance.  HENT:     Head: Normocephalic and atraumatic.     Nose: Nose normal.     Mouth/Throat:     Mouth: Mucous membranes are moist.     Pharynx: Oropharynx is clear.  Eyes:     Extraocular Movements: Extraocular movements intact.     Conjunctiva/sclera: Conjunctivae normal.  Neck:     Comments: No midline neck tenderness Cardiovascular:     Rate and Rhythm: Normal rate and regular rhythm.     Pulses: Normal pulses.     Heart sounds: Normal heart sounds.  Pulmonary:     Effort: Pulmonary effort is normal.     Breath sounds: Normal breath sounds.  Abdominal:     General: Abdomen is flat.     Palpations: Abdomen is soft.     Tenderness: There is no abdominal tenderness.  Musculoskeletal:     Cervical back: Normal range of motion and neck supple.     Comments: Significant tenderness to palpation of right lateral shoulder with overlying bruising and decreased ROM secondary to pain, no tenderness to right elbow or wrist with full elbow flexion/extension/supination and pronation No midline back tenderness No tenderness  to palpation of left upper extremity or bilateral lower extremities  Skin:    General: Skin is warm and dry.     Findings: Bruising (Right shoulder) present.  Neurological:     General: No focal deficit present.     Mental Status: He is alert and oriented to person, place, and time.     Sensory: No sensory deficit.     Motor: No weakness.  Psychiatric:        Mood and Affect: Mood normal.        Behavior: Behavior normal.  ED Results / Procedures / Treatments   Labs (all labs ordered are listed, but only abnormal results are displayed) Labs Reviewed - No data to display  EKG None  Radiology CT Head Wo Contrast  Result Date: 12/25/2022 CLINICAL DATA:  Trauma EXAM: CT HEAD WITHOUT CONTRAST CT CERVICAL SPINE WITHOUT CONTRAST TECHNIQUE: Multidetector CT imaging of the head and cervical spine was performed following the standard protocol without intravenous contrast. Multiplanar CT image reconstructions of the cervical spine were also generated. RADIATION DOSE REDUCTION: This exam was performed according to the departmental dose-optimization program which includes automated exposure control, adjustment of the mA and/or kV according to patient size and/or use of iterative reconstruction technique. COMPARISON:  CT head 02/19/22, MRI head 02/03/22 FINDINGS: CT HEAD FINDINGS Brain: No evidence of acute infarction, hemorrhage, hydrocephalus, extra-axial collection or mass lesion/mass effect. Chronic right pontine infarct. Sequela of moderate chronic microvascular ischemic change. Vascular: No hyperdense vessel or unexpected calcification. Skull: Normal. Negative for fracture or focal lesion. Sinuses/Orbits: No middle ear or mastoid effusion. Paranasal sinuses are clear. There is mild mucosal thickening along the anterior aspect of the right maxillary sinus. Orbits are unremarkable. Other: None. CT CERVICAL SPINE FINDINGS Alignment: There is straightening of the normal cervical lordosis. Grade 1  anterolisthesis of C3 on C4. Skull base and vertebrae: No acute fracture. No primary bone lesion or focal pathologic process. Soft tissues and spinal canal: No evidence of high-grade spinal canal stenosis. Severe right-sided neural foraminal stenosis at C5-C6 Disc levels:  No evidence of high-grade spinal canal stenosis. Upper chest: Negative. Other: None IMPRESSION: 1. No CT evidence of intracranial injury. 2. No acute cervical spine fracture 3. 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 No evidence of high-grade spinal canal stenosis. Electronically Signed   By: Marin Roberts M.D.   On: 12/25/2022 16:15   CT Cervical Spine Wo Contrast  Result Date: 12/25/2022 CLINICAL DATA:  Trauma EXAM: CT HEAD WITHOUT CONTRAST CT CERVICAL SPINE WITHOUT CONTRAST TECHNIQUE: Multidetector CT imaging of the head and cervical spine was performed following the standard protocol without intravenous contrast. Multiplanar CT image reconstructions of the cervical spine were also generated. RADIATION DOSE REDUCTION: This exam was performed according to the departmental dose-optimization program which includes automated exposure control, adjustment of the mA and/or kV according to patient size and/or use of iterative reconstruction technique. COMPARISON:  CT head 02/19/22, MRI head 02/03/22 FINDINGS: CT HEAD FINDINGS Brain: No evidence of acute infarction, hemorrhage, hydrocephalus, extra-axial collection or mass lesion/mass effect. Chronic right pontine infarct. Sequela of moderate chronic microvascular ischemic change. Vascular: No hyperdense vessel or unexpected calcification. Skull: Normal. Negative for fracture or focal lesion. Sinuses/Orbits: No middle ear or mastoid effusion. Paranasal sinuses are clear. There is mild mucosal thickening along the anterior aspect of the right maxillary sinus. Orbits are unremarkable. Other: None. CT CERVICAL SPINE FINDINGS Alignment: There is straightening of the normal cervical  lordosis. Grade 1 anterolisthesis of C3 on C4. Skull base and vertebrae: No acute fracture. No primary bone lesion or focal pathologic process. Soft tissues and spinal canal: No evidence of high-grade spinal canal stenosis. Severe right-sided neural foraminal stenosis at C5-C6 Disc levels:  No evidence of high-grade spinal canal stenosis. Upper chest: Negative. Other: None IMPRESSION: 1. No CT evidence of intracranial injury. 2. No acute cervical spine fracture 3. 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 No evidence of high-grade spinal canal stenosis. Electronically Signed   By: Marin Roberts M.D.   On: 12/25/2022 16:15   DG  Shoulder Right  Result Date: 12/25/2022 CLINICAL DATA:  Fall onto right shoulder today. Shoulder pain. On Plavix. EXAM: RIGHT SHOULDER - 2+ VIEW COMPARISON:  Radiographs 09/13/2021. FINDINGS: The bones are demineralized. There is a mildly comminuted and mildly displaced fracture of the right humeral neck, extending into the greater tuberosity. No involvement of the articular surface, dislocation or scapular fracture identified. Mild underlying glenohumeral and acromioclavicular degenerative changes. There are low lung volumes with probable atelectasis at the right lung base. No visualized acute right-sided rib fractures. IMPRESSION: Mildly comminuted and mildly displaced fracture of the right humeral neck as described. Electronically Signed   By: Richardean Sale M.D.   On: 12/25/2022 15:36    Procedures Procedures    Medications Ordered in ED Medications  oxyCODONE-acetaminophen (PERCOCET/ROXICET) 5-325 MG per tablet 1 tablet (1 tablet Oral Given 12/25/22 1544)    ED Course/ Medical Decision Making/ A&P Clinical Course as of 12/25/22 1655  Sun Dec 25, 2022  1620 Patient has C5-C6 neuraminal stenosis but no acute traumatic injury on CT imaging. He has no neurologic symptoms. He is stable for discharge with orthopedics follow up. [VK]    Clinical Course User  Index [VK] Kemper Durie, DO                             Medical Decision Making This patient presents to the ED with chief complaint(s) of fall, shoulder pain with pertinent past medical history of CAD, myopathy, HTN, DM which further complicates the presenting complaint. The complaint involves an extensive differential diagnosis and also carries with it a high risk of complications and morbidity.    The differential diagnosis includes shoulder fracture/dislocation, ICH, mass effect, no presyncopal symptoms making syncopal fall unlikely, no other traumatic injuries seen on exam  Additional history obtained: Additional history obtained from spouse Records reviewed outpatient orthopedic records  ED Course and Reassessment: On patient's arrival to the emergency department he is initially evaluated by triage and had x-ray of his right shoulder performed.  Patient's x-ray does show evidence of a fracture of the right humeral neck.  The patient is neurovascularly intact.  Due to patient's age on aspirin and Plavix and because he thinks he may have hit his head he will additionally have a CT head and C-spine to evaluate for ICH, mass effect or cervical spine fracture.  He was given pain control and shoulder sling.  Independent labs interpretation:  The following labs were independently interpreted: N/A  Independent visualization of imaging: - I independently visualized the following imaging with scope of interpretation limited to determining acute life threatening conditions related to emergency care: CTH/C-spine, R shoulder XR, which revealed humeral neck fracture, no other acute traumatic injuries  Consultation: - Consulted or discussed management/test interpretation w/ external professional: N/A  Consideration for admission or further workup: Patient has no emergent conditions requiring admission or further work-up at this time and is stable for discharge home with orthopedics  follow-up  Social Determinants of health: N/A    Amount and/or Complexity of Data Reviewed Radiology: ordered.  Risk OTC drugs. Prescription drug management.          Final Clinical Impression(s) / ED Diagnoses Final diagnoses:  Closed displaced fracture of surgical neck of right humerus, unspecified fracture morphology, initial encounter  Fall, initial encounter    Rx / DC Orders ED Discharge Orders          Ordered    oxyCODONE (ROXICODONE)  5 MG immediate release tablet  Every 6 hours PRN        12/25/22 1653    senna-docusate (SENOKOT-S) 8.6-50 MG tablet  Daily        12/25/22 1654              Kemper Durie, DO 12/25/22 1655

## 2022-12-25 NOTE — ED Triage Notes (Signed)
Patient here POV from Home.  Endorses tripping on uneven pavement onto Right Shoulder at 1000. No Head Injury. No LOC. Fell onto Right Shoulder. Does take Plavix.   NAD Noted During Triage. A&Ox4. GCS 15. Ambulatory.

## 2022-12-25 NOTE — ED Notes (Signed)
Sling placed to left shoulder as ordered. Pt discharged to home, NAD noted

## 2022-12-25 NOTE — Discharge Instructions (Addendum)
You were seen in the emergency department after your fall.  You did break your arm bone in your shoulder on your right arm.  You had no other injuries seen on your exam.  We have placed your arm in a sling and you can take this off to shower but otherwise should be wearing until you are instructed by orthopedics and should do no lifting with your right arm.  You can take Tylenol 650 mg every 6 hours as needed for pain and you can take oxycodone as needed up to every 6 hours for pain.  You should ice your shoulder.  Oxycodone can make you drowsy so do not take it before driving, operating heavy machinery or working.  It can also make you constipated and I have given you a stool softener that you can take while you are on the oxycodone.  You should follow-up with orthopedics within the next week to have your shoulder rechecked and to determine further management.  You should return to the emergency department for significantly worsening pain, numbness or weakness in your arm, your fingers turning black or blue or if you have any other new or concerning symptoms.

## 2022-12-27 ENCOUNTER — Ambulatory Visit: Payer: Medicare Other | Admitting: Orthopedic Surgery

## 2022-12-27 ENCOUNTER — Encounter: Payer: Self-pay | Admitting: Orthopedic Surgery

## 2022-12-27 ENCOUNTER — Telehealth: Payer: Self-pay | Admitting: Orthopedic Surgery

## 2022-12-27 DIAGNOSIS — S42291A Other displaced fracture of upper end of right humerus, initial encounter for closed fracture: Secondary | ICD-10-CM

## 2022-12-27 MED ORDER — OXYCODONE HCL 5 MG PO TABS: 5.0000 mg | ORAL_TABLET | Freq: Four times a day (QID) | ORAL | 0 refills | Status: DC | PRN

## 2022-12-27 NOTE — Telephone Encounter (Signed)
Dean wanted patient to come back in 8 days please advise no avail appts

## 2022-12-27 NOTE — Progress Notes (Signed)
Office Visit Note   Patient: [name]           Date of Birth: [birth year]           MRN: [id number] Visit Date: 12/27/2022 Requested by: Greig Right, MD [address] [place],  [place] [address] PCP: Greig Right, MD  Subjective: Chief Complaint  Patient presents with   Right Shoulder - Injury, Fracture    HPI: [name] is a 67 y.o. male who presents to the office reporting right upper extremity pain.  Date of injury 12/25/2022.  [place] in garage and landed on his right arm.  Fell onto the shoulder.  No prior injury or surgery to the right shoulder.  Taking oxycodone for pain 1 a day.  Has had prior total knee replacement 8 or 9 years ago.  Patient does take Plavix.  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  He is on aspirin as well.  Had a MI a year ago and a stroke in May.  Stent in April 2023..                ROS: All systems reviewed are negative as they relate to the chief complaint within the history of present illness.  Patient denies fevers or chills.  Assessment & Plan: Visit Diagnoses: No diagnosis found.  Plan: Impression is relatively well aligned right proximal humerus fracture.  Would like for him to stay in the sling is much as possible.  Needs repeat radiographs in 7 days.  This would be AP and outlet views with no axillary lateral.  Right now the fracture is well aligned in terms of shaft head.  Not in much varus at all.  Needs repeat radiographs and if possible would like to treat this nonoperatively unless gross displacement occurs.  Follow-Up Instructions: Return in about 1 week (around 01/03/2023).   Orders:  No orders of the defined types were placed in this encounter.  Meds ordered this encounter  Medications   oxyCODONE (ROXICODONE) 5 MG immediate release tablet    Sig: Take 1 tablet (5 mg total) by mouth every 6 (six) hours as needed for severe pain.    Dispense:  20 tablet    Refill:  0      Procedures: No procedures performed   Clinical  Data: No additional findings.  Objective: Vital Signs: There were no vitals taken for this visit.  Physical Exam:  Constitutional: Patient appears well-developed HEENT:  Head: Normocephalic Eyes:EOM are normal Neck: Normal range of motion Cardiovascular: Normal rate Pulmonary/chest: Effort normal Neurologic: Patient is alert Skin: Skin is warm Psychiatric: Patient has normal mood and affect  Ortho Exam: Ortho exam demonstrates ecchymosis in the right upper extremity but compartments are soft.  Radial pulse intact.  EPL FPL interosseous intact.  No paresthesias in the deltoid region.  Elbow nontender.  Specialty Comments:  No specialty comments available.  Imaging: No results found.   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   Elevated CK 02/02/2022   Elevated LFTs 02/02/2022   CAD (coronary artery disease) 02/02/2022   Muscle weakness of all 4 extremities 02/02/2022   Hyperlipidemia 01/28/2022   Arthritis of knee 09/02/2014   Staghorn kidney  stones 03/17/2014   Nephrolithiasis 02/01/2014   Pneumonia 08/27/2011   Diabetes mellitus with coincident hypertension 08/27/2011   Past Medical History:  Diagnosis Date   Aortic stenosis    mild AS 02/04/22   Arthritis    Diabetes mellitus    fasting 120s   History of kidney stones    Hypertension    Myocardial infarction    stent placed   Peripheral neuropathy    left fifth finger, left toe   Pneumonia    hx of    Family History  Problem Relation Age of Onset   Alzheimer's disease Mother    Heart attack Father     Past Surgical History:  Procedure Laterality Date   APPENDECTOMY     CORONARY STENT  INTERVENTION N/A 12/28/2021   Procedure: CORONARY STENT INTERVENTION;  Surgeon: Jettie Booze, MD;  Location: [facility];  Service: Cardiovascular;  Laterality: N/A;   CORONARY ULTRASOUND/IVUS N/A 12/28/2021   Procedure: Intravascular Ultrasound/IVUS;  Surgeon: Jettie Booze, MD;  Location: [facility];  Service: Cardiovascular;  Laterality: N/A;   CYSTOSCOPY W/ URETERAL STENT PLACEMENT Bilateral 03/17/2014   Procedure: CYSTOSCOPY WITH RIGHT STENT PULL  ;  Surgeon: Alexis Frock, MD;  Location: [facility];  Service: Urology;  Laterality: Bilateral;   CYSTOSCOPY WITH RETROGRADE PYELOGRAM, URETEROSCOPY AND STENT PLACEMENT Bilateral 02/01/2014   Procedure: CYSTOSCOPY WITH BILATERAL RETROGRADE CYSTOSCOPY, BILATERAL RETROGRADE, PYELOGRAM, RIGHT URETEROSCOPY, STONE BASKETRY,  AND BILATERAL STENT PLACEMENT;  Surgeon: Alexis Frock, MD;  Location: [facility];  Service: Urology;  Laterality: Bilateral;   CYSTOSCOPY WITH URETEROSCOPY AND STENT PLACEMENT Left 02/12/2021   Procedure: CYSTOSCOPY WITH LEFT URETEROSCOPY, BALLOON DILITATION, HOLMIUM LASER LITHOTRIPSY AND STENT PLACEMENT;  Surgeon: Lucas Mallow, MD;  Location: [facility];  Service: Urology;  Laterality: Left;   HOLMIUM LASER APPLICATION Left [id number]   Procedure: HOLMIUM LASER APPLICATION;  Surgeon: Alexis Frock, MD;  Location: [facility];  Service: Urology;  Laterality: Left;   LEFT HEART CATH AND CORONARY ANGIOGRAPHY N/A 12/28/2021   Procedure: LEFT HEART CATH AND CORONARY ANGIOGRAPHY;  Surgeon: Jettie Booze, MD;  Location: [facility];  Service: Cardiovascular;  Laterality: N/A;   LITHOTRIPSY     MUSCLE BIOPSY Left 04/19/2022   Procedure: LEFT THIGH MUSCLE BIOPSY;  Surgeon: Erroll Luna, MD;  Location: [facility];  Service: General;  Laterality: Left;   NEPHROLITHOTOMY Left 03/17/2014   Procedure: NEPHROLITHOTOMY PERCUTANEOUS with access;  Surgeon: Alexis Frock, MD;  Location: [facility];  Service: Urology;  Laterality:  Left;   NEPHROLITHOTOMY Left 03/19/2014   Procedure: 2ND STAGE NEPHROLITHOTOMY PERCUTANEOUS left ureteroscopy with lasr stone treatment and stent placement, with access;  Surgeon: Alexis Frock, MD;  Location: [facility];  Service: Urology;  Laterality: Left;   TOTAL KNEE ARTHROPLASTY Right 09/02/2014   Procedure: RIGHT TOTAL KNEE ARTHROPLASTY;  Surgeon: Meredith Pel, MD;  Location: [facility];  Service: Orthopedics;  Laterality: Right;   uretral stent     Social History   Occupational History   Not on file  Tobacco Use   Smoking status: Never   Smokeless tobacco: Never  Vaping Use   Vaping Use: Never used  Substance and Sexual Activity   Alcohol use: Yes    Alcohol/week: 1.0 standard drink of alcohol    Types: 1 Cans of beer per week    Comment: occasional/ 1 beers week   Drug use: No   Sexual activity: Yes    Birth control/protection: Other-see comments

## 2022-12-27 NOTE — Telephone Encounter (Signed)
Called patient confirmed appt for 04/10 at 3pm

## 2022-12-29 ENCOUNTER — Telehealth: Payer: Self-pay

## 2022-12-29 NOTE — Telephone Encounter (Signed)
     Patient  visit on 3/31  at [facility]  Have you been able to follow up with your primary care physician? Yes   The patient was or was not able to obtain any needed medicine or equipment. Yes   Are there diet recommendations that you are having difficulty following? na  Patient expresses understanding of discharge instructions and education provided has no other needs at this time. Yes     [facility] [phone] [address], [place], [place] [address] Phone: [phone] Email: [email or web address]

## 2023-01-04 ENCOUNTER — Encounter: Payer: Self-pay | Admitting: Orthopedic Surgery

## 2023-01-04 ENCOUNTER — Ambulatory Visit: Payer: Medicare Other | Admitting: Orthopedic Surgery

## 2023-01-04 ENCOUNTER — Ambulatory Visit (INDEPENDENT_AMBULATORY_CARE_PROVIDER_SITE_OTHER): Payer: Medicare Other

## 2023-01-04 DIAGNOSIS — S42291A Other displaced fracture of upper end of right humerus, initial encounter for closed fracture: Secondary | ICD-10-CM | POA: Diagnosis not present

## 2023-01-04 NOTE — Progress Notes (Signed)
Post-Op Visit Note   Patient: [name]           Date of Birth: 1956/07/05           MRN: [id number] Visit Date: 01/04/2023 PCP: Alinda DeemPenner, Pamela, MD   Assessment & Plan:  Chief Complaint:  Chief Complaint  Patient presents with   Right Shoulder - Follow-up, Fracture   Visit Diagnoses:  1. Other closed displaced fracture of proximal end of right humerus, initial encounter     Plan: Patient presents now about 10 days out from proximal humerus fracture on the right.  Date of injury 12/25/2022.  Taking oxycodone and pain management.  On examination his deltoid is functional and motor sensory function of the hand is intact.  Swelling has diminished.  Radiographs show slight change in angulation of the humeral shaft relative to the head but not enough to change current management.  Plan is follow back up in 11 days for repeat radiographs and likely assessment of fracture stability and initiation of pendulum exercises.  Follow-Up Instructions: No follow-ups on file.   Orders:  Orders Placed This Encounter  Procedures   XR Shoulder Right   No orders of the defined types were placed in this encounter.   Imaging: XR Shoulder Right  Result Date: 01/04/2023 AP outlet radiographs right shoulder reviewed.  Proximal humerus fracture again demonstrated.  Slight anterior displacement of the shaft relative to the head compared to prior radiographs.  Overall the shaft remains well aligned underneath the humeral head.  No acute fracture.  No callus formation.   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   Elevated CK 02/02/2022   Elevated LFTs 02/02/2022   CAD (coronary artery  disease) 02/02/2022   Muscle weakness of all 4 extremities 02/02/2022   Hyperlipidemia 01/28/2022   Arthritis of knee 09/02/2014   Staghorn kidney stones 03/17/2014   Nephrolithiasis 02/01/2014   Pneumonia 08/27/2011   Diabetes mellitus with coincident hypertension 08/27/2011   Past Medical History:  Diagnosis Date   Aortic stenosis    mild AS 02/04/22   Arthritis    Diabetes mellitus    fasting 120s   History of kidney stones    Hypertension    Myocardial infarction    stent placed   Peripheral neuropathy    left fifth finger, left toe   Pneumonia    hx of    Family History  Problem Relation Age of Onset   Alzheimer's disease Mother    Heart attack Father     Past Surgical History:  Procedure Laterality Date   APPENDECTOMY     CORONARY STENT INTERVENTION N/A 12/28/2021   Procedure: CORONARY STENT INTERVENTION;  Surgeon: Corky CraftsVaranasi, Jayadeep S, MD;  [facility]: [facility];  Service: Cardiovascular;  Laterality: N/A;   CORONARY ULTRASOUND/IVUS N/A 12/28/2021   Procedure: Intravascular Ultrasound/IVUS;  Surgeon: Corky CraftsVaranasi, Jayadeep S, MD;  [facility];  Service: Cardiovascular;  Laterality: N/A;   CYSTOSCOPY W/ URETERAL STENT PLACEMENT Bilateral 03/17/2014   Procedure: CYSTOSCOPY WITH RIGHT STENT PULL  ;  Surgeon: Sebastian Acheheodore Manny, MD;  Location: [facility];  Service: Urology;  Laterality: Bilateral;   CYSTOSCOPY WITH RETROGRADE PYELOGRAM, URETEROSCOPY AND STENT PLACEMENT Bilateral  02/01/2014   Procedure: CYSTOSCOPY WITH BILATERAL RETROGRADE CYSTOSCOPY, BILATERAL RETROGRADE, PYELOGRAM, RIGHT URETEROSCOPY, STONE BASKETRY,  AND BILATERAL STENT PLACEMENT;  Surgeon: Sebastian Ache, MD;  Location: [facility];  Service: Urology;  Laterality: Bilateral;   CYSTOSCOPY WITH URETEROSCOPY AND STENT PLACEMENT Left 02/12/2021   Procedure: CYSTOSCOPY WITH LEFT URETEROSCOPY, BALLOON DILITATION, HOLMIUM LASER LITHOTRIPSY AND STENT PLACEMENT;  Surgeon: Crista Elliot, MD;  Location: [facility];  Service: Urology;  Laterality: Left;   HOLMIUM LASER APPLICATION Left 03/19/2014   Procedure: HOLMIUM LASER APPLICATION;  Surgeon: Sebastian Ache, MD;  [facility]: [facility];  Service: Urology;  Laterality: Left;   LEFT HEART CATH AND CORONARY ANGIOGRAPHY N/A 12/28/2021   Procedure: LEFT HEART CATH AND CORONARY ANGIOGRAPHY;  Surgeon: Corky Crafts, MD;  Location: [facility]ASIVE CV [facility];  Service: Cardiovascular;  Laterality: N/A;   LITHOTRIPSY     MUSCLE BIOPSY Left 04/19/2022   Procedure: LEFT THIGH MUSCLE BIOPSY;  Surgeon: Harriette Bouillon, MD;  Location: [facility];  Service: General;  Laterality: Left;   NEPHROLITHOTOMY Left 03/17/2014   Procedure: NEPHROLITHOTOMY PERCUTANEOUS with access;  Surgeon: Sebastian Ache, MD;  [facility]: [facility];  Service: Urology;  Laterality: Left;   NEPHROLITHOTOMY Left 03/19/2014   Procedure: 2ND STAGE NEPHROLITHOTOMY PERCUTANEOUS left ureteroscopy with lasr stone treatment and stent placement, with access;  Surgeon: Sebastian Ache, MD;  [facility]: [facility];  Service: Urology;  Laterality: Left;   TOTAL KNEE ARTHROPLASTY Right 09/02/2014   Procedure: RIGHT TOTAL KNEE ARTHROPLASTY;  Surgeon: Cammy Copa, MD;  Location: [facility] OR;  Service: Orthopedics;  Laterality: Right;   uretral stent     Social History   Occupational History   Not on file  Tobacco Use   Smoking status: Never   Smokeless tobacco: Never  Vaping Use   Vaping Use: Never used  Substance and Sexual Activity   Alcohol use: Yes    Alcohol/week: 1.0 standard drink of alcohol    Types: 1 Cans of beer per week    Comment: occasional/ 1 beers week   Drug use: No   Sexual activity: Yes    Birth control/protection: Other-see comments

## 2023-01-16 ENCOUNTER — Other Ambulatory Visit (INDEPENDENT_AMBULATORY_CARE_PROVIDER_SITE_OTHER): Payer: Medicare Other

## 2023-01-16 ENCOUNTER — Ambulatory Visit: Payer: Medicare Other | Admitting: Orthopedic Surgery

## 2023-01-16 DIAGNOSIS — S42291A Other displaced fracture of upper end of right humerus, initial encounter for closed fracture: Secondary | ICD-10-CM | POA: Diagnosis not present

## 2023-01-17 ENCOUNTER — Encounter: Payer: Self-pay | Admitting: Orthopedic Surgery

## 2023-01-17 NOTE — Progress Notes (Signed)
Post-Op Visit Note   Patient: [name]           Date of Birth: 01-Nov-1955           MRN: [id number] Visit Date: 01/16/2023 PCP: Alinda Deem, MD   Assessment & Plan:  Chief Complaint:  Chief Complaint  Patient presents with   Right Shoulder - Follow-up, Fracture   Visit Diagnoses:  1. Other closed displaced fracture of proximal end of right humerus, initial encounter     Plan: [name] is a patient with right proximal humerus fracture.  Date of injury 12/25/2022.  He has been in a sling.  On examination today he has predictably stiff shoulder but no crepitus with passive range of motion indicating some initial healing of the fracture.  Radiographs confirm minimal displacement of the tuberosity fragment with callus formation visible.  Taking Tylenol daily with oxycodone at bedtime.  Plan at this time is pendulums 3-4 times a day to back in a sling.  3-week return with repeat radiographs at that time.  We can refill the oxycodone in a week if needed.  Follow-Up Instructions: No follow-ups on file.   Orders:  Orders Placed This Encounter  Procedures   XR Shoulder Right   No orders of the defined types were placed in this encounter.   Imaging: XR Shoulder Right  Result Date: 01/17/2023 Multiple radiographic views right shoulder reviewed.  Callus formation is now present around proximal humerus fracture.  There is been about 1 to 2 mm of displacement of the greater tuberosity fragment laterally but not superiorly.  Callus formation is present around this fragment.  Overall substantial healing is occurred compared to prior radiographs.  Shoulder remains located.   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   Gait abnormality 03/07/2022   Cerebrovascular accident (CVA) 03/07/2022   Myalgia due to statin 02/28/2022   Mild aortic  stenosis 02/28/2022   Polymyositis 02/04/2022   Elevated CK 02/02/2022   Elevated LFTs 02/02/2022   CAD (coronary artery disease) 02/02/2022   Muscle weakness of all 4 extremities 02/02/2022   Hyperlipidemia 01/28/2022   Arthritis of knee 09/02/2014   Staghorn kidney stones 03/17/2014   Nephrolithiasis 02/01/2014   Pneumonia 08/27/2011   Diabetes mellitus with coincident hypertension 08/27/2011   Past Medical History:  Diagnosis Date   Aortic stenosis    mild AS 02/04/22   Arthritis    Diabetes mellitus    fasting 120s   History of kidney stones    Hypertension    Myocardial infarction    stent placed   Peripheral neuropathy    left fifth finger, left toe   Pneumonia    hx of    Family History  Problem Relation Age of Onset   Alzheimer's disease Mother    Heart attack Father     Past Surgical History:  Procedure Laterality Date   APPENDECTOMY     CORONARY STENT INTERVENTION N/A 12/28/2021   Procedure: CORONARY STENT INTERVENTION;  Surgeon: Corky Crafts, MD;  [facility];  Service: Cardiovascular;  Laterality: N/A;   CORONARY ULTRASOUND/IVUS N/A 12/28/2021   Procedure: Intravascular Ultrasound/IVUS;  Surgeon: Corky Crafts, MD;  Location: [facility] INVASIVE CV [facility];  Service: Cardiovascular;  Laterality: N/A;   CYSTOSCOPY W/ URETERAL STENT PLACEMENT Bilateral 03/17/2014   Procedure: CYSTOSCOPY WITH RIGHT STENT PULL  ;  Surgeon:  Sebastian Ache, MD;  Location: [facility];  Service: Urology;  Laterality: Bilateral;   CYSTOSCOPY WITH RETROGRADE PYELOGRAM, URETEROSCOPY AND STENT PLACEMENT Bilateral 02/01/2014   Procedure: CYSTOSCOPY WITH BILATERAL RETROGRADE CYSTOSCOPY, BILATERAL RETROGRADE, PYELOGRAM, RIGHT URETEROSCOPY, STONE BASKETRY,  AND BILATERAL STENT PLACEMENT;  Surgeon: Sebastian Ache, MD;  Location: [facility];  Service: Urology;  Laterality: Bilateral;   CYSTOSCOPY WITH URETEROSCOPY AND STENT PLACEMENT Left 02/12/2021   Procedure: CYSTOSCOPY WITH LEFT  URETEROSCOPY, BALLOON DILITATION, HOLMIUM LASER LITHOTRIPSY AND STENT PLACEMENT;  Surgeon: Crista Elliot, MD;  [facility]: [facility];  Service: Urology;  Laterality: Left;   HOLMIUM LASER APPLICATION Left 03/19/2014   Procedure: HOLMIUM LASER APPLICATION;  Surgeon: Sebastian Ache, MD;  [facility]: [facility];  Service: Urology;  Laterality: Left;   LEFT HEART CATH AND CORONARY ANGIOGRAPHY N/A 12/28/2021   Procedure: LEFT HEART CATH AND CORONARY ANGIOGRAPHY;  Surgeon: Corky Crafts, MD;  Location: [facility] CV [facility];  Service: Cardiovascular;  Laterality: N/A;   LITHOTRIPSY     MUSCLE BIOPSY Left 04/19/2022   Procedure: LEFT THIGH MUSCLE BIOPSY;  Surgeon: Harriette Bouillon, MD;  [facility];  Service: General;  Laterality: Left;   NEPHROLITHOTOMY Left 03/17/2014   Procedure: NEPHROLITHOTOMY PERCUTANEOUS with access;  Surgeon: Sebastian Ache, MD;  [facility];  Service: Urology;  Laterality: Left;   NEPHROLITHOTOMY Left 03/19/2014   Procedure: 2ND STAGE NEPHROLITHOTOMY PERCUTANEOUS left ureteroscopy with lasr stone treatment and stent placement, with access;  Surgeon: Sebastian Ache, MD;  Location: [facility];  Service: Urology;  Laterality: Left;   TOTAL KNEE ARTHROPLASTY Right 09/02/2014   Procedure: RIGHT TOTAL KNEE ARTHROPLASTY;  Surgeon: Cammy Copa, MD;  Location: [facility] OR;  Service: Orthopedics;  Laterality: Right;   uretral stent     Social History   Occupational History   Not on file  Tobacco Use   Smoking status: Never   Smokeless tobacco: Never  Vaping Use   Vaping Use: Never used  Substance and Sexual Activity   Alcohol use: Yes    Alcohol/week: 1.0 standard drink of alcohol    Types: 1 Cans of beer per week    Comment: occasional/ 1 beers week   Drug use: No   Sexual activity: Yes    Birth control/protection: Other-see comments

## 2023-01-24 ENCOUNTER — Other Ambulatory Visit: Payer: Self-pay | Admitting: Surgical

## 2023-01-24 ENCOUNTER — Telehealth: Payer: Self-pay | Admitting: Orthopedic Surgery

## 2023-01-24 MED ORDER — OXYCODONE HCL 5 MG PO TABS: 5.0000 mg | ORAL_TABLET | Freq: Three times a day (TID) | ORAL | 0 refills | Status: DC | PRN

## 2023-01-24 NOTE — Telephone Encounter (Signed)
Patient asking for a refill on his Oxycodone 5mg  please advise patient

## 2023-01-24 NOTE — Telephone Encounter (Signed)
Sent in refill

## 2023-02-06 ENCOUNTER — Other Ambulatory Visit (INDEPENDENT_AMBULATORY_CARE_PROVIDER_SITE_OTHER): Payer: Medicare Other

## 2023-02-06 ENCOUNTER — Encounter: Payer: Self-pay | Admitting: Orthopedic Surgery

## 2023-02-06 ENCOUNTER — Ambulatory Visit: Payer: Medicare Other | Admitting: Orthopedic Surgery

## 2023-02-06 DIAGNOSIS — S42291A Other displaced fracture of upper end of right humerus, initial encounter for closed fracture: Secondary | ICD-10-CM

## 2023-02-06 NOTE — Progress Notes (Unsigned)
Post-Op Visit Note   Patient: [name]           Date of Birth: [birth year]           MRN: [id number] Visit Date: 02/06/2023 PCP: Alinda Deem, MD   Assessment & Plan:  Chief Complaint:  Chief Complaint  Patient presents with   Right Shoulder - Follow-up, Fracture    DOI: 12/25/22   Visit Diagnoses:  1. Other closed displaced fracture of proximal end of right humerus, initial encounter     Plan: [name] is a patient is now 6 weeks out right proximal humerus fracture.  Date of injury 12/25/2022.  Taking Tylenol for symptoms as well as oxycodone at night.  On examination he has pretty reasonable passive range of motion.  Deltoid fires.  Rotator cuff predictably weak.  No coarseness or popping with internal/external rotation of the right arm.  Radiographs look good with excellent callus formation.  Plan is discontinue sling.  Home exercise program for range of motion and 6-week return with no radiographs on return but just clinical evaluation.  Follow-Up Instructions: No follow-ups on file.   Orders:  Orders Placed This Encounter  Procedures   XR Shoulder Right   No orders of the defined types were placed in this encounter.   Imaging: No results found.  PMFS History: Patient Active Problem List   Diagnosis Date Noted   Diabetic peripheral neuropathy associated with type 2 diabetes mellitus (HCC) 03/23/2022   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   Gait abnormality 03/07/2022   Cerebrovascular accident (CVA) (HCC) 03/07/2022   Myalgia due to statin 02/28/2022   Mild aortic stenosis 02/28/2022   Polymyositis (HCC) 02/04/2022   Elevated CK 02/02/2022   Elevated LFTs 02/02/2022   CAD (coronary artery disease) 02/02/2022   Muscle weakness of all 4 extremities 02/02/2022   Hyperlipidemia 01/28/2022   Arthritis of knee 09/02/2014   Staghorn kidney stones 03/17/2014   Nephrolithiasis 02/01/2014   Pneumonia 08/27/2011   Diabetes mellitus with coincident hypertension  (HCC) 08/27/2011   Past Medical History:  Diagnosis Date   Aortic stenosis    mild AS 02/04/22   Arthritis    Diabetes mellitus    fasting 120s   History of kidney stones    Hypertension    Myocardial infarction (HCC)    stent placed   Peripheral neuropathy    left fifth finger, left toe   Pneumonia    hx of    Family History  Problem Relation Age of Onset   Alzheimer's disease Mother    Heart attack Father     Past Surgical History:  Procedure Laterality Date   APPENDECTOMY     CORONARY STENT INTERVENTION N/A 12/28/2021   Procedure: CORONARY STENT INTERVENTION;  Surgeon: Corky Crafts, MD;  [facility];  Service: Cardiovascular;  Laterality: N/A;   CORONARY ULTRASOUND/IVUS N/A 12/28/2021   Procedure: Intravascular Ultrasound/IVUS;  Surgeon: Corky Crafts, MD;  Location: [facility] INV[facility] CV [facility];  Service: Cardiovascular;  Laterality: N/A;   CYSTOSCOPY W/ URETERAL STENT PLACEMENT Bilateral 03/17/2014   Procedure: CYSTOSCOPY WITH RIGHT STENT PULL  ;  Surgeon: Sebastian Ache, MD;  Location: [facility];  Service: Urology;  Laterality: Bilateral;   CYSTOSCOPY WITH RETROGRADE PYELOGRAM, URETEROSCOPY AND STENT PLACEMENT Bilateral 02/01/2014   Procedure: CYSTOSCOPY WITH BILATERAL RETROGRADE CYSTOSCOPY, BILATERAL RETROGRADE, PYELOGRAM, RIGHT URETEROSCOPY, STONE BASKETRY,  AND BILATERAL STENT PLACEMENT;  Surgeon: Sebastian Ache, MD;  Location: [facility];  Service: Urology;  Laterality: Bilateral;  CYSTOSCOPY WITH URETEROSCOPY AND STENT PLACEMENT Left 02/12/2021   Procedure: CYSTOSCOPY WITH LEFT URETEROSCOPY, BALLOON DILITATION, HOLMIUM LASER LITHOTRIPSY AND STENT PLACEMENT;  Surgeon: Crista Elliot, MD;  Location: [facility];  Service: Urology;  Laterality: Left;   HOLMIUM LASER APPLICATION Left 03/19/2014   Procedure: HOLMIUM LASER APPLICATION;  Surgeon: Sebastian Ache, MD;  [facility]: [facility];  Service: Urology;  Laterality: Left;   LEFT HEART CATH AND CORONARY ANGIOGRAPHY N/A  12/28/2021   Procedure: LEFT HEART CATH AND CORONARY ANGIOGRAPHY;  Surgeon: Corky Crafts, MD;  Location: [facility] INVASIVE CV [facility];  Service: Cardiovascular;  Laterality: N/A;   LITHOTRIPSY     MUSCLE BIOPSY Left 04/19/2022   Procedure: LEFT THIGH MUSCLE BIOPSY;  Surgeon: Harriette Bouillon, MD;  Location: [facility];  Service: General;  Laterality: Left;   NEPHROLITHOTOMY Left 03/17/2014   Procedure: NEPHROLITHOTOMY PERCUTANEOUS with access;  Surgeon: Sebastian Ache, MD;  Location: [facility];  Service: Urology;  Laterality: Left;   NEPHROLITHOTOMY Left 03/19/2014   Procedure: 2ND STAGE NEPHROLITHOTOMY PERCUTANEOUS left ureteroscopy with lasr stone treatment and stent placement, with access;  Surgeon: Sebastian Ache, MD;  Location: [facility];  Service: Urology;  Laterality: Left;   TOTAL KNEE ARTHROPLASTY Right 09/02/2014   Procedure: RIGHT TOTAL KNEE ARTHROPLASTY;  Surgeon: Cammy Copa, MD;  Location: [facility] OR;  Service: Orthopedics;  Laterality: Right;   uretral stent     Social History   Occupational History   Not on file  Tobacco Use   Smoking status: Never   Smokeless tobacco: Never  Vaping Use   Vaping Use: Never used  Substance and Sexual Activity   Alcohol use: Yes    Alcohol/week: 1.0 standard drink of alcohol    Types: 1 Cans of beer per week    Comment: occasional/ 1 beers week   Drug use: No   Sexual activity: Yes    Birth control/protection: Other-see comments

## 2023-02-07 ENCOUNTER — Ambulatory Visit: Payer: Medicare Other | Admitting: Nurse Practitioner

## 2023-02-14 ENCOUNTER — Other Ambulatory Visit: Payer: Self-pay

## 2023-02-14 MED ORDER — CARVEDILOL 12.5 MG PO TABS: 12.5000 mg | ORAL_TABLET | Freq: Two times a day (BID) | ORAL | 1 refills | Status: AC

## 2023-02-14 NOTE — Telephone Encounter (Signed)
Pt's medication was sent to pt's pharmacy as requested. Confirmation received.  °

## 2023-02-22 DIAGNOSIS — R748 Abnormal levels of other serum enzymes: Secondary | ICD-10-CM | POA: Diagnosis not present

## 2023-02-22 DIAGNOSIS — I10 Essential (primary) hypertension: Secondary | ICD-10-CM | POA: Diagnosis not present

## 2023-02-22 DIAGNOSIS — G720 Drug-induced myopathy: Secondary | ICD-10-CM | POA: Diagnosis not present

## 2023-02-22 DIAGNOSIS — E669 Obesity, unspecified: Secondary | ICD-10-CM | POA: Diagnosis not present

## 2023-02-22 DIAGNOSIS — E1121 Type 2 diabetes mellitus with diabetic nephropathy: Secondary | ICD-10-CM | POA: Diagnosis not present

## 2023-02-22 DIAGNOSIS — M3322 Polymyositis with myopathy: Secondary | ICD-10-CM | POA: Diagnosis not present

## 2023-02-23 LAB — LAB REPORT - SCANNED
A1c: 7.6
Albumin/Creatinine Ratio, Urine, POC: 23
Creatinine, POC: 107.9 mg/dL
EGFR (Non-African Amer.): 102
Microalb, Ur: 2.5

## 2023-03-10 ENCOUNTER — Other Ambulatory Visit: Payer: Self-pay | Admitting: Neurology

## 2023-03-14 ENCOUNTER — Telehealth: Payer: Self-pay | Admitting: Internal Medicine

## 2023-03-14 MED ORDER — REPATHA SURECLICK 140 MG/ML ~~LOC~~ SOAJ: 1.0000 | SUBCUTANEOUS | 3 refills | Status: DC

## 2023-03-14 NOTE — Telephone Encounter (Signed)
Refill sent in

## 2023-03-14 NOTE — Telephone Encounter (Signed)
*  STAT* If patient is at the pharmacy, call can be transferred to refill team.   1. Which medications need to be refilled? (please list name of each medication and dose if known) Repatha  2. Which pharmacy/location (including street and city if local pharmacy) is medication to be sent to? [facility] [address] [place], [place]  3. Do they need a 30 day or 90 day supply? 90 days

## 2023-03-20 ENCOUNTER — Encounter: Payer: Self-pay | Admitting: Orthopedic Surgery

## 2023-03-20 ENCOUNTER — Telehealth: Payer: Self-pay

## 2023-03-20 ENCOUNTER — Ambulatory Visit: Payer: Medicare Other | Admitting: Orthopedic Surgery

## 2023-03-20 DIAGNOSIS — S42291A Other displaced fracture of upper end of right humerus, initial encounter for closed fracture: Secondary | ICD-10-CM

## 2023-03-20 NOTE — Telephone Encounter (Signed)
Patient called back and stated the pharmacy, told him that our office need to contact the insurance company. In order to be able to refill the patient medicaion. Please advise

## 2023-03-20 NOTE — Progress Notes (Signed)
Post-Op Visit Note   Patient: [name]           Date of Birth: Mar 10, 1956           MRN: [id number] Visit Date: 03/20/2023 PCP: Alinda Deem, MD   Assessment & Plan:  Chief Complaint:  Chief Complaint  Patient presents with   Right Shoulder - Follow-up, Fracture   Visit Diagnoses:  1. Other closed displaced fracture of proximal end of right humerus, initial encounter     Plan: [name] is a patient is now almost 3 months out proximal humerus fracture on the right.  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  He moved 1 month ago which did put some stress on his shoulder but he did not really have any problem with it.  Has 0 out of 10 pain.  Does not take much pain medicine at all.  On examination he has range of motion of 70/70/140.  Has about 5- out of 5 external rotation strength on the right compared to the left.  All in all his shoulder looks very functional considering the proximal humerus fracture.  He will follow-up as needed.  Follow-Up Instructions: No follow-ups on file.   Orders:  No orders of the defined types were placed in this encounter.  No orders of the defined types were placed in this encounter.   Imaging: No results found.  PMFS History: Patient Active Problem List   Diagnosis Date Noted   Diabetic peripheral neuropathy associated with type 2 diabetes mellitus (HCC) 03/23/2022   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   Gait abnormality 03/07/2022   Cerebrovascular accident (CVA) (HCC) 03/07/2022   Myalgia due to statin 02/28/2022   Mild aortic stenosis 02/28/2022   Polymyositis (HCC) 02/04/2022   Elevated CK 02/02/2022   Elevated LFTs 02/02/2022   CAD (coronary artery disease) 02/02/2022   Muscle weakness of all 4 extremities 02/02/2022   Hyperlipidemia 01/28/2022   Arthritis of knee 09/02/2014   Staghorn kidney stones 03/17/2014   Nephrolithiasis 02/01/2014   Pneumonia 08/27/2011   Diabetes mellitus with coincident hypertension (HCC)  08/27/2011   Past Medical History:  Diagnosis Date   Aortic stenosis    mild AS 02/04/22   Arthritis    Diabetes mellitus    fasting 120s   History of kidney stones    Hypertension    Myocardial infarction (HCC)    stent placed   Peripheral neuropathy    left fifth finger, left toe   Pneumonia    hx of    Family History  Problem Relation Age of Onset   Alzheimer's disease Mother    Heart attack Father     Past Surgical History:  Procedure Laterality Date   APPENDECTOMY     CORONARY STENT INTERVENTION N/A 12/28/2021   Procedure: CORONARY STENT INTERVENTION;  Surgeon: Corky Crafts, MD;  [facility]: [facility];  Service: Cardiovascular;  Laterality: N/A;   CORONARY ULTRASOUND/IVUS N/A 12/28/2021   Procedure: Intravascular Ultrasound/IVUS;  Surgeon: Corky Crafts, MD;  Location: [facility] INVASIVE CV LAB;  Service: Cardiovascular;  Laterality: N/A;   CYSTOSCOPY W/ URETERAL STENT PLACEMENT Bilateral 03/17/2014   Procedure: CYSTOSCOPY WITH RIGHT STENT PULL  ;  Surgeon: Sebastian Ache, MD;  Location: [facility];  Service: Urology;  Laterality: Bilateral;   CYSTOSCOPY WITH RETROGRADE PYELOGRAM, URETEROSCOPY AND STENT PLACEMENT Bilateral 02/01/2014   Procedure: CYSTOSCOPY WITH BILATERAL RETROGRADE CYSTOSCOPY, BILATERAL RETROGRADE, PYELOGRAM, RIGHT URETEROSCOPY, STONE BASKETRY,  AND BILATERAL STENT PLACEMENT;  Surgeon: Normand Sloop  Berneice Heinrich, MD;  Location: [facility];  Service: Urology;  Laterality: Bilateral;   CYSTOSCOPY WITH URETEROSCOPY AND STENT PLACEMENT Left 02/12/2021   Procedure: CYSTOSCOPY WITH LEFT URETEROSCOPY, BALLOON DILITATION, HOLMIUM LASER LITHOTRIPSY AND STENT PLACEMENT;  Surgeon: Crista Elliot, MD;  Location: [facility];  Service: Urology;  Laterality: Left;   HOLMIUM LASER APPLICATION Left 03/19/2014   Procedure: HOLMIUM LASER APPLICATION;  Surgeon: Sebastian Ache, MD;  Location: [facility];  Service: Urology;  Laterality: Left;   LEFT HEART CATH AND CORONARY ANGIOGRAPHY N/A  12/28/2021   Procedure: LEFT HEART CATH AND CORONARY ANGIOGRAPHY;  Surgeon: Corky Crafts, MD;  Location: [facility] INVASIVE CV LAB;  Service: Cardiovascular;  Laterality: N/A;   LITHOTRIPSY     MUSCLE BIOPSY Left 04/19/2022   Procedure: LEFT THIGH MUSCLE BIOPSY;  Surgeon: Harriette Bouillon, MD;  Location: [facility];  Service: General;  Laterality: Left;   NEPHROLITHOTOMY Left 03/17/2014   Procedure: NEPHROLITHOTOMY PERCUTANEOUS with access;  Surgeon: Sebastian Ache, MD;  Location: [facility];  Service: Urology;  Laterality: Left;   NEPHROLITHOTOMY Left 03/19/2014   Procedure: 2ND STAGE NEPHROLITHOTOMY PERCUTANEOUS left ureteroscopy with lasr stone treatment and stent placement, with access;  Surgeon: Sebastian Ache, MD;  Location: [facility];  Service: Urology;  Laterality: Left;   TOTAL KNEE ARTHROPLASTY Right 09/02/2014   Procedure: RIGHT TOTAL KNEE ARTHROPLASTY;  Surgeon: Cammy Copa, MD;  Location: [facility] OR;  Service: Orthopedics;  Laterality: Right;   uretral stent     Social History   Occupational History   Not on file  Tobacco Use   Smoking status: Never   Smokeless tobacco: Never  Vaping Use   Vaping Use: Never used  Substance and Sexual Activity   Alcohol use: Yes    Alcohol/week: 1.0 standard drink of alcohol    Types: 1 Cans of beer per week    Comment: occasional/ 1 beers week   Drug use: No   Sexual activity: Yes    Birth control/protection: Other-see comments

## 2023-03-20 NOTE — Telephone Encounter (Signed)
The patient called and said that he needed to speak to the pharmacist about getting a refill on his repatha. The patient was told that the pharmacist sent his repatha in to [facility] on 03/14/2023. The patient said he would check with [facility] and call back if he has any issues.

## 2023-03-21 ENCOUNTER — Telehealth: Payer: Self-pay

## 2023-03-21 ENCOUNTER — Other Ambulatory Visit: Payer: Self-pay | Admitting: Internal Medicine

## 2023-03-21 NOTE — Telephone Encounter (Addendum)
Pharmacy Patient Advocate Encounter   Received notification from [facility] that prior authorization for REPATHA 140MG /ML is required/requested.   PA submitted to [facility] via CoverMyMeds Key or (Medicaid) confirmation # [id number]  Status is pending

## 2023-03-28 ENCOUNTER — Telehealth: Payer: Self-pay

## 2023-03-28 NOTE — Telephone Encounter (Signed)
Tech submitted prior auth on 6/25. Sent them a message asking for status update on request. Advised pt request was submitted and we're waiting to hear back from insurance. He can pick up a sample in the mean time if needed. He will stop by [facility] office tomorrow morning (advised after 8am due to PharmD mtg) to pick up 1-2 Repatha samples.

## 2023-03-28 NOTE — Telephone Encounter (Signed)
Pt calling stating that he still has not gotten his medication Repatha. [facility] stating that they he requested a prior auth for this medication. Pt would like a call back concerning this matter, he has not gotten his medication for 3 weeks. Please address

## 2023-03-29 MED ORDER — REPATHA SURECLICK 140 MG/ML ~~LOC~~ SOAJ: 140.0000 mg | SUBCUTANEOUS | 0 refills | Status: DC

## 2023-03-29 NOTE — Telephone Encounter (Signed)
Patient was here to pick up samples - provided 2 doses of Repatha sureclick

## 2023-04-03 ENCOUNTER — Encounter: Payer: Self-pay | Admitting: Adult Health

## 2023-04-03 ENCOUNTER — Ambulatory Visit: Payer: Medicare Other | Admitting: Adult Health

## 2023-04-03 VITALS — BP 141/75 | HR 72 | Ht 70.0 in | Wt 210.0 lb

## 2023-04-03 DIAGNOSIS — R269 Unspecified abnormalities of gait and mobility: Secondary | ICD-10-CM

## 2023-04-03 DIAGNOSIS — G7249 Other inflammatory and immune myopathies, not elsewhere classified: Secondary | ICD-10-CM

## 2023-04-03 DIAGNOSIS — I639 Cerebral infarction, unspecified: Secondary | ICD-10-CM

## 2023-04-03 MED ORDER — REPATHA SURECLICK 140 MG/ML ~~LOC~~ SOAJ: 140.0000 mg | SUBCUTANEOUS | 3 refills | Status: DC

## 2023-04-03 NOTE — Telephone Encounter (Addendum)
Left message for pt that Repatha has been re-approved by his insurance.

## 2023-04-03 NOTE — Patient Instructions (Addendum)
We will check lab work today - based on level, will discuss possible lower dose of prednisone with Dr. Terrace Arabia - I will notify you via MyChart regarding her recommendations     Follow up in 6 months or call earlier if needed

## 2023-04-03 NOTE — Telephone Encounter (Signed)
Pharmacy Patient Advocate Encounter  Prior Authorization for REPATHA has been APPROVED by [facility] from 7.2.24 to 7.2.25.

## 2023-04-03 NOTE — Addendum Note (Signed)
Addended by: Cedra Villalon E on: 04/03/2023 11:51 AM   Modules accepted: Orders

## 2023-04-03 NOTE — Progress Notes (Signed)
Chief Complaint  Patient presents with   Follow-up    Rm 3, here with  Follow up on Inflammatory myopathy.  States he fell 2 months ago, broke right shoulder in 3 places, wore a sling and did home PT, states it has healed well. Overall doing well.       ASSESSMENT AND PLAN  [name] is a 67 y.o. male   Inflammatory myopathy  Currently doing well on prednisone and methotrexate, mild proximal muscle weakness and occasional imbalance but overall stable without progression  Responded well to prednisone treatment, started as 80 mg since May 2023, with significant side effect, poorly controlled diabetes,  Prednisone dosage gradually decreased currently taking 10 mg daily, concern of weight gain, will repeat CPK level, based on results may consider further decreasing dosage  Continue methotrexate 2.5 mg 6 tablets since June 2023,  Not currently receiving IVIG due to insurance coverage issue  Significantly elevated CPK, diagnosis was made shortly after his heart attack and stroke in 2023, at his worst, he has gait abnormality, intermittent mild swallowing difficulty  Muscle biopsy of left vastus lateralis muscle on April 19, 2022 showed no clear necrosis, inflammation, phagocytosis, there were rare regenerating fibers, type II fiber predominant, with evidence of atrophic fibers   Mild gait abnormality  Likely combination of proximal muscle weakness, residual mild left hemiparesis from right pontine stroke, right artificial knee, left knee pain,  Continue use of cane as needed   Right pontine stroke Coronary artery disease  With mild residual left upper and lower extremity weakness, hyperreflexia on the left side,  Vascular risk factor of aging, hypertension, hyperlipidemia, diabetes  Continue to follow PCP for aggressive stroke risk factor management    Follow-up in 6 months or call earlier if needed Addendum, CPK has been normalized since January 2024, exam showed mild bilateral  hip flexion weakness, on methotrexate 15 mg weekly, agreed to further taper down prednisone, 7.5 mg for 1 month, then remain on 5 mg, encourage patient moderate exercise, if no worsening of his symptoms, may further taper down prednisone  DIAGNOSTIC DATA (LABS, IMAGING, TESTING) - I reviewed patient records, labs, notes, testing and imaging myself where available. Addendum, patient was seen by Dr. Sharl Ma Cornett on April 01, 2022, pathology reported no clear necrosis, inflammation, phagocytosis, there were rare regenerating fibers, type II fiber predominant, with evidence of atrophic fibers  MEDICAL HISTORY:  UPDATE 04/03/2023 JM:  Reports he had a fall back in March after tripping on object in garage resulting in a right proximal humerus fracture. Did not require surgery, recently completed PT and reports healing well.  He has routinely followed with orthopedics.  Denies any recurrent falls since that time.  Feels overall he has been doing well since prior visit.  Does still have intermittent imbalance which is not new and will use a cane as needed.  Mild proximal leg weakness although feels improving. Prednisone dosage decreased after prior visit as CPK level within normal limits, currently taking 10 mg daily but concern of weight gain and difficulty with weight loss. Questions further reducing dosage.  Continues on methotrexate.  No longer receiving IVIG due to insurance coverage issue.  Denies any progression of symptoms.    History provided for reference purposes only UPDATE Oct 04 2022 Dr. Terrace Arabia: He run out of prednisone 20mg   2 weeks prior to visit, stop it abruptly, he noticed mily change, balance is off, dizzy, light headed,  knees are weaker, arms are strong,  no bulbar weakness,   UPDATE August 28th 2023 Dr. Terrace Arabia: He is with his son at today's clinical visit, left breast symptomatic for muscle biopsy on April 19, 2022, showed minimum abnormal muscles suggestive of necrosis without clear  inflammation, some lymphocyte as seen surrounding muscle fibers,  He received IVIG at the beginning of August 2023, tolerating it well, maintenance 1 g/kg is scheduled for August 29/30,  Methotrexate 2.5 mg 6 tablets every Monday, along with folic acid, tolerating it well  Prednisone has been decreased to 20 mg daily since August 2023, no longer have significant side effect  He reported 75% back to baseline, ambulating without cane, still mild unsteady gait, need push-up to get up from seated position, better range of motion of upper extremity, water therapy has been helpful   Consult visit 03/07/2022 Dr. Terrace Arabia: [name] is a 67 year old male, seen in request by his primary care physician Dr. Alinda Deem, to follow-up for stroke, inflammatory myopathy, initial evaluation was with her daughter on March 07, 2022,  I reviewed and summarized the referring note. PMHx. HTN CAD, April 2023, was on brilinta DM since 2017  He has been physically active, suffered heart attack in April 2023 while pushing mowing his lawn, was noted to have elevated troponin, underwent stent, double antiplatelet treatment aspirin and Brilinta  During that hospital admission, he was found to have elevated abnormal liver functional test, progressive weakness since the end of 2022, his atorvastatin 20 mg was on hold since then  Hospital admission for stroke on Feb 02, 2022 for weakness, to the point his father has to help him to get into the car, laboratory evaluation showed CPK 7291, AST 181, ALT  CT of the chest abdomen and pelvis with contrast negative for evidence of metastatic disease, extensive coronary artery calcification  He was seen by neurologist, with his presentation of symmetric bilateral upper and lower extremity weakness, consistent with inflammatory myositis  MRI of the brain showed acute infarction involving ventral pontine  CT angiogram of head and neck showed right ICA stenosis measuring at  least 70% proximally, 70% at the paraclinoid segment, 50% left internal carotid artery stenosis, bilateral vertebral origin stenosis measuring 50% on the right 40% on the left  Laboratory evaluations in June 2023, CPK 991, CBC showed elevated WBC 21.7 hemoglobin 13.7, BMP creatinine 0.5,  HMGCR ig antibody was significantly elevated 97 units, which may be associated with necrotizing autoimmune myopathy in a subset of statin treated patients, although infrequent, this antibody may also be observed in statin nave patient with necrotizing autoimmune myopathy,  Normal magnesium, phosphate, elevated ALT 262, AST 65, A1c 7.4, LDL was 97.8, normal TSH, negative HIV, negative D-dimer, lipase,  MRI of cervical spine showed multilevel degenerative changes, no significant canal stenosis, C3-4 severe left, C5-6 severe right, C6-7 severe left and moderate right neuroforaminal stenosis  He was treated with Solu-Medrol 1 g daily for 3 days followed by p.o. prednisone tapering starting from 80 mg daily, decrement of 10 mg every 1 week, will be taking 50 mg March 08, 2022  Case was discussed with Dr. Barnetta Chapel of general surgeon, planning on having muscle biopsy, he was discharged to rehabilitation, follow-up visit did showed improvement of his muscle weakness, was able to ambulate, the response to steroid also favored the diagnosis of inflammatory myopathy, he was discharged to inpatient rehabilitation, discharged on Feb 16, 2022, he now lives alone at home, ambulated with a cane  Emergency presentation 3 days later Feb 19, 2022, fell, hit his left frontal region on the back,  CT head showed no acute abnormality  He was not on any antiplatelet agent prior to hospital admission, now taking aspirin and Plavix, he has some improvement, but far from his baseline, has difficulty raising arm overhead, could not put plate in the cabinet, gait abnormality,        PHYSICAL EXAM:   Vitals:   04/03/23 1059   BP: (!) 141/75  Pulse: 72  Weight: 210 lb (95.3 kg)  Height: 5\' 10"  (1.778 m)      Body mass index is 30.13 kg/m.  PHYSICAL EXAMNIATION:  Gen: NAD, conversant, well nourised, well groomed                     Cardiovascular: Regular rate rhythm, no peripheral edema, warm, nontender. Eyes: Conjunctivae clear without exudates or hemorrhage Neck: Supple, no carotid bruits. Pulmonary: Clear to auscultation bilaterally   NEUROLOGICAL EXAM:  MENTAL STATUS: Speech/cognition: Awake, alert, oriented to history taking and casual conversation CRANIAL NERVES: CN II: Visual fields are full to confrontation. Pupils are round equal and briskly reactive to light. CN III, IV, VI: extraocular movement are normal. No ptosis. CN V: Facial sensation is intact to light touch CN VII: Face is symmetric with normal eye closure  CN VIII: Hearing is normal to causal conversation. CN IX, X: Phonation is normal. CN XI: Head turning and shoulder shrug are intact  MOTOR: Limited range of motion of right shoulder due to prior rotator cuff injury and recent humerus fracture, mild neck flexion weakness. Mild BLE weakness greater at hip flexors  REFLEXES: Reflexes are mildly hyperreflexia on left upper extremity, patella, bilateral Babinski sign   SENSORY: Intact to light touch, pinprick and vibratory sensation are intact in fingers and toes.  COORDINATION: There is no trunk or limb dysmetria noted.  GAIT/STANCE: Can get up from seated position with arms crossed, wide-based, cautious gait, difficulty performing tandem walk and heel toe.  Ambulating without AD  REVIEW OF SYSTEMS:  Full 14 system review of systems performed and notable only for as above All other review of systems were negative.   ALLERGIES: Allergies  Allergen Reactions   Atorvastatin Calcium Other (See Comments)    Statin induced myopathy and transaminitis    HOME MEDICATIONS: Current Outpatient Medications  Medication Sig  Dispense Refill   amLODipine (NORVASC) 10 MG tablet Take 1 tablet (10 mg total) by mouth every morning. 30 tablet 0   aspirin 81 MG EC tablet Take 1 tablet (81 mg total) by mouth daily. Swallow whole. 90 tablet 3   carvedilol (COREG) 12.5 MG tablet Take 1 tablet (12.5 mg total) by mouth 2 (two) times daily with a meal. (Patient taking differently: Take 12.5 mg by mouth 2 (two) times daily with a meal. Takes 1 1/2 pill BID) 180 tablet 1   cholecalciferol (VITAMIN D) 1000 units tablet Take 1 tablet (1,000 Units total) by mouth daily. 30 tablet 0   citalopram (CELEXA) 20 MG tablet Take 1 tablet (20 mg total) by mouth daily. 30 tablet 0   clopidogrel (PLAVIX) 75 MG tablet Take 1 tablet (75 mg total) by mouth daily. 90 tablet 1   Cyanocobalamin (VITAMIN B-12) 2500 MCG SUBL Place 1 tablet (2,500 mcg total) under the tongue daily. 30 tablet 0   cyclobenzaprine (FLEXERIL) 10 MG tablet Take 1 tablet (10 mg total) by mouth at bedtime. 30 tablet 0   diclofenac Sodium (VOLTAREN) 1 % GEL  Apply 2 g topically 4 (four) times daily. (Patient taking differently: Apply 2 g topically 3 (three) times daily as needed (pain).) 100 g 0   Evolocumab (REPATHA SURECLICK) 140 MG/ML SOAJ Inject 140 mg into the skin every 14 (fourteen) days. (Patient taking differently: Inject 1 each into the skin every 14 (fourteen) days. 20 UNITS BID) 6 mL 3   Evolocumab (REPATHA SURECLICK) 140 MG/ML SOAJ Inject 140 mg into the skin every 14 (fourteen) days. 2 mL 0   folic acid (FOLVITE) 1 MG tablet Take 1 mg by mouth daily.     insulin glargine (LANTUS) 100 UNIT/ML Solostar Pen Inject 12 Units into the skin 2 (two) times daily. (Patient taking differently: Inject 14 Units into the skin 2 (two) times daily.) 15 mL 11   insulin lispro (HUMALOG) 100 UNIT/ML KwikPen Inject 5 Units into the skin every evening.     lisinopril (ZESTRIL) 40 MG tablet Take 1 tablet (40 mg total) by mouth every morning. 30 tablet 0   metFORMIN (GLUCOPHAGE) 500 MG tablet  Take 2 tablets (1,000 mg total) by mouth 2 (two) times daily with a meal. 60 tablet 0   methotrexate (RHEUMATREX) 2.5 MG tablet TAKE 6 TABLETS (15 MG TOTAL) BY MOUTH ONCE A WEEK -  CAUTION  - CHEMOTHERAPY  -  PROTECT  FROM  LIGHT 30 tablet 0   Multiple Vitamins-Minerals (MULTIVITAMINS THER. W/MINERALS) TABS Take 1 tablet by mouth daily.     nitroGLYCERIN (NITROSTAT) 0.4 MG SL tablet Place 1 tablet (0.4 mg total) under the tongue every 5 (five) minutes x 3 doses as needed for chest pain. 25 tablet 12   Omega-3 Fatty Acids (FISH OIL) 1200 MG CAPS Take 1,200 mg by mouth in the morning and at bedtime.     predniSONE (DELTASONE) 10 MG tablet Take 2 tablets (20 mg total) by mouth daily with breakfast. 180 tablet 3   senna-docusate (SENOKOT-S) 8.6-50 MG tablet Take 1 tablet by mouth daily. 30 tablet 0   acetaminophen (TYLENOL) 325 MG tablet Take 1-2 tablets (325-650 mg total) by mouth every 4 (four) hours as needed for mild pain. (Patient not taking: Reported on 04/03/2023)     No current facility-administered medications for this visit.    PAST MEDICAL HISTORY: Past Medical History:  Diagnosis Date   Aortic stenosis    mild AS 02/04/22   Arthritis    Diabetes mellitus    fasting 120s   History of kidney stones    Hypertension    Myocardial infarction (HCC)    stent placed   Peripheral neuropathy    left fifth finger, left toe   Pneumonia    hx of    PAST SURGICAL HISTORY: Past Surgical History:  Procedure Laterality Date   APPENDECTOMY     CORONARY STENT INTERVENTION N/A 12/28/2021   Procedure: CORONARY STENT INTERVENTION;  Surgeon: Corky Crafts, MD;  Location: [facility] INVASIVE CV LAB;  Service: Cardiovascular;  Laterality: N/A;   CORONARY ULTRASOUND/IVUS N/A 12/28/2021   Procedure: Intravascular Ultrasound/IVUS;  Surgeon: Corky Crafts, MD;  Location: [facility] INVASIVE CV LAB;  Service: Cardiovascular;  Laterality: N/A;   CYSTOSCOPY W/ URETERAL STENT PLACEMENT Bilateral 03/17/2014    Procedure: CYSTOSCOPY WITH RIGHT STENT PULL  ;  Surgeon: Sebastian Ache, MD;  Location: [facility] ORS;  Service: Urology;  Laterality: Bilateral;   CYSTOSCOPY WITH RETROGRADE PYELOGRAM, URETEROSCOPY AND STENT PLACEMENT Bilateral 02/01/2014   Procedure: CYSTOSCOPY WITH BILATERAL RETROGRADE CYSTOSCOPY, BILATERAL RETROGRADE, PYELOGRAM, RIGHT URETEROSCOPY, STONE BASKETRY,  AND  BILATERAL STENT PLACEMENT;  Surgeon: Sebastian Ache, MD;  Location: [facility];  Service: Urology;  Laterality: Bilateral;   CYSTOSCOPY WITH URETEROSCOPY AND STENT PLACEMENT Left 02/12/2021   Procedure: CYSTOSCOPY WITH LEFT URETEROSCOPY, BALLOON DILITATION, HOLMIUM LASER LITHOTRIPSY AND STENT PLACEMENT;  Surgeon: Crista Elliot, MD;  Location: [facility];  Service: Urology;  Laterality: Left;   HOLMIUM LASER APPLICATION Left 03/19/2014   Procedure: HOLMIUM LASER APPLICATION;  Surgeon: Sebastian Ache, MD;  Location: [facility];  Service: Urology;  Laterality: Left;   LEFT HEART CATH AND CORONARY ANGIOGRAPHY N/A 12/28/2021   Procedure: LEFT HEART CATH AND CORONARY ANGIOGRAPHY;  Surgeon: Corky Crafts, MD;  Location: [facility] INVASIVE CV LAB;  Service: Cardiovascular;  Laterality: N/A;   LITHOTRIPSY     MUSCLE BIOPSY Left 04/19/2022   Procedure: LEFT THIGH MUSCLE BIOPSY;  Surgeon: Harriette Bouillon, MD;  Location: [facility] OR;  Service: General;  Laterality: Left;   NEPHROLITHOTOMY Left 03/17/2014   Procedure: NEPHROLITHOTOMY PERCUTANEOUS with access;  Surgeon: Sebastian Ache, MD;  Location: [facility];  Service: Urology;  Laterality: Left;   NEPHROLITHOTOMY Left 03/19/2014   Procedure: 2ND STAGE NEPHROLITHOTOMY PERCUTANEOUS left ureteroscopy with lasr stone treatment and stent placement, with access;  Surgeon: Sebastian Ache, MD;  Location: [facility];  Service: Urology;  Laterality: Left;   TOTAL KNEE ARTHROPLASTY Right 09/02/2014   Procedure: RIGHT TOTAL KNEE ARTHROPLASTY;  Surgeon: Cammy Copa, MD;  Location[facility] OR;  Service: Orthopedics;  Laterality: Right;    uretral stent      FAMILY HISTORY: Family History  Problem Relation Age of Onset   Alzheimer's disease Mother    Heart attack Father     SOCIAL HISTORY: Social History   Socioeconomic History   Marital status: Widowed    Spouse name: Not on file   Number of children: 2   Years of education: 3 yeae   Highest education level: Not on file  Occupational History   Not on file  Tobacco Use   Smoking status: Never   Smokeless tobacco: Never  Vaping Use   Vaping Use: Never used  Substance and Sexual Activity   Alcohol use: Yes    Alcohol/week: 1.0 standard drink of alcohol    Types: 1 Cans of beer per week    Comment: occasional/ 1 beers week   Drug use: No   Sexual activity: Yes    Birth control/protection: Other-see comments  Other Topics Concern   Not on file  Social History Narrative   Not on file   Social Determinants of Health   Financial Resource Strain: Not on file  Food Insecurity: Not on file  Transportation Needs: Not on file  Physical Activity: Not on file  Stress: Not on file  Social Connections: Not on file  Intimate Partner Violence: Not on file      I spent 31 minutes of face-to-face and non-face-to-face time with patient.  This included previsit chart review, lab review, study review, order entry, electronic health record documentation, patient education and discussion regarding above diagnoses, treatment plan and answered all other questions to patient satisfaction  Ihor Austin, [facility]  [facility] [address] [place], [place] [address]  Phone [phone] Fax [phone] Note: This document was prepared with digital dictation and possible smart phrase technology. Any transcriptional errors that result from this process are unintentional.

## 2023-04-04 ENCOUNTER — Encounter: Payer: Self-pay | Admitting: Adult Health

## 2023-04-04 ENCOUNTER — Other Ambulatory Visit: Payer: Self-pay | Admitting: Adult Health

## 2023-04-04 LAB — TSH: TSH: 0.531 u[IU]/mL (ref 0.450–4.500)

## 2023-04-04 LAB — CK: Total CK: 210 U/L (ref 41–331)

## 2023-04-04 MED ORDER — PREDNISONE 5 MG PO TABS: 7.5000 mg | ORAL_TABLET | Freq: Every day | ORAL | 5 refills | Status: DC

## 2023-04-11 ENCOUNTER — Other Ambulatory Visit: Payer: Self-pay | Admitting: Neurology

## 2023-04-27 NOTE — Progress Notes (Signed)
Office Visit    Patient Name: [name] Date of Encounter: 04/27/2023  Primary Care Provider:  Alinda Deem, MD Primary Cardiologist:  Rodney Constant, MD Primary Electrophysiologist: None   Past Medical History    Past Medical History:  Diagnosis Date   Aortic stenosis    mild AS 02/04/22   Arthritis    Diabetes mellitus    fasting 120s   History of kidney stones    Hypertension    Myocardial infarction (HCC)    stent placed   Peripheral neuropathy    left fifth finger, left toe   Pneumonia    hx of   Past Surgical History:  Procedure Laterality Date   APPENDECTOMY     CORONARY STENT INTERVENTION N/A 12/28/2021   Procedure: CORONARY STENT INTERVENTION;  Surgeon: Rodney Crafts, MD;  Location: [facility] INVASIVE CV LAB;  Service: Cardiovascular;  Laterality: N/A;   CORONARY ULTRASOUND/IVUS N/A 12/28/2021   Procedure: Intravascular Ultrasound/IVUS;  Surgeon: Rodney Crafts, MD;  Location: [facility] INVASIVE CV LAB;  Service: Cardiovascular;  Laterality: N/A;   CYSTOSCOPY W/ URETERAL STENT PLACEMENT Bilateral 03/17/2014   Procedure: CYSTOSCOPY WITH RIGHT STENT PULL  ;  Surgeon: Rodney Ache, MD;  Location: [facility];  Service: Urology;  Laterality: Bilateral;   CYSTOSCOPY WITH RETROGRADE PYELOGRAM, URETEROSCOPY AND STENT PLACEMENT Bilateral 02/01/2014   Procedure: CYSTOSCOPY WITH BILATERAL RETROGRADE CYSTOSCOPY, BILATERAL RETROGRADE, PYELOGRAM, RIGHT URETEROSCOPY, STONE BASKETRY,  AND BILATERAL STENT PLACEMENT;  Surgeon: Rodney Ache, MD;  Location: [facility];  Service: Urology;  Laterality: Bilateral;   CYSTOSCOPY WITH URETEROSCOPY AND STENT PLACEMENT Left 02/12/2021   Procedure: CYSTOSCOPY WITH LEFT URETEROSCOPY, BALLOON DILITATION, HOLMIUM LASER LITHOTRIPSY AND STENT PLACEMENT;  Surgeon: Rodney Elliot, MD;  Location: [facility];  Service: Urology;  Laterality: Left;   HOLMIUM LASER APPLICATION Left 03/19/2014   Procedure: HOLMIUM LASER APPLICATION;  Surgeon: Rodney Ache, MD;  Location: [facility];  Service: Urology;  Laterality: Left;   LEFT HEART CATH AND CORONARY ANGIOGRAPHY N/A 12/28/2021   Procedure: LEFT HEART CATH AND CORONARY ANGIOGRAPHY;  Surgeon: Rodney Crafts, MD;  Location: [facility] INVASIVE CV LAB;  Service: Cardiovascular;  Laterality: N/A;   LITHOTRIPSY     MUSCLE BIOPSY Left 04/19/2022   Procedure: LEFT THIGH MUSCLE BIOPSY;  Surgeon: Rodney Bouillon, MD;  Location: [facility] OR;  Service: General;  Laterality: Left;   NEPHROLITHOTOMY Left 03/17/2014   Procedure: NEPHROLITHOTOMY PERCUTANEOUS with access;  Surgeon: Rodney Ache, MD;  Location: [facility];  Service: Urology;  Laterality: Left;   NEPHROLITHOTOMY Left 03/19/2014   Procedure: 2ND STAGE NEPHROLITHOTOMY PERCUTANEOUS left ureteroscopy with lasr stone treatment and stent placement, with access;  Surgeon: Rodney Ache, MD;  Location: [facility];  Service: Urology;  Laterality: Left;   TOTAL KNEE ARTHROPLASTY Right 09/02/2014   Procedure: RIGHT TOTAL KNEE ARTHROPLASTY;  Surgeon: Rodney Copa, MD;  Location: [facility] OR;  Service: Orthopedics;  Laterality: Right;   uretral stent      Allergies  Allergies  Allergen Reactions   Atorvastatin Calcium Other (See Comments)    Statin induced myopathy and transaminitis     History of Present Illness    RONDALE Gutierrez  is a 67 year old male with a PMH of CAD s/p NSTEMI 12/2021 PCI/DES ostial RCA, inflammatory myopathy, HTN, HLD, DM type II, stage I prostate CA transaminitis who presents today for 22-month follow-up.  [name] was seen initially in 12/2021 with complaints of chest pain and was  found  to have an NSTEMI.  He underwent LHC and was found to have 80% ostial RCA with PCI/DES x 1.  2D echo was completed showing EF of 45-50% with global hypokinesis and grade 1 DD with mild to moderate AAS.  Repeat echo completed 01/2022 showed recovered EF of 60 to 65% with normal LV function and grade 1 DD with mild aortic stenosis with mean gradient of 11 mmHg.  He is  currently on PCSK9 due to transaminitis with statins.  He was last seen by Dr. Raynelle Gutierrez on 07/28/2022 and reported doing well with no new cardiac complaints.  He was  noted to have previous orthostatic hypotension that had resolved.  He was seen in the ED on 12/25/2022 for a fall and suffered a shoulder fracture.  He was seen recently by neurology and CK levels were improving and prednisone was decreased to 7.5 mg.  Since last being seen in the office patient reports that he has been doing well with no new cardiac complaints.  He is staying active and currently works building pools in [employer].  He checks his blood pressures daily and has noticed some elevated BP and blood pressure initially today was 140/78 and was 127/82 on recheck.  He is tolerating his current medications without any adverse reactions other than bruising and occasional bleeding when checking his CBG and giving insulin shots.  He reports some episodes of dizziness however these are short-term and not progressive.  He does work a sedentary job however does walk 6000-8000 steps per day.  We discussed the importance of physical activity and he will work on increasing his steps to at least 10,000/day.  Patient denies chest pain, palpitations, dyspnea, PND, orthopnea, nausea, vomiting, dizziness, syncope, edema, weight gain, or early satiety.   Home Medications    Current Outpatient Medications  Medication Sig Dispense Refill   acetaminophen (TYLENOL) 325 MG tablet Take 1-2 tablets (325-650 mg total) by mouth every 4 (four) hours as needed for mild pain. (Patient not taking: Reported on 04/03/2023)     amLODipine (NORVASC) 10 MG tablet Take 1 tablet (10 mg total) by mouth every morning. 30 tablet 0   aspirin 81 MG EC tablet Take 1 tablet (81 mg total) by mouth daily. Swallow whole. 90 tablet 3   carvedilol (COREG) 12.5 MG tablet Take 1 tablet (12.5 mg total) by mouth 2 (two) times daily with a meal. (Patient taking  differently: Take 12.5 mg by mouth 2 (two) times daily with a meal. Takes 1 1/2 pill BID) 180 tablet 1   cholecalciferol (VITAMIN D) 1000 units tablet Take 1 tablet (1,000 Units total) by mouth daily. 30 tablet 0   citalopram (CELEXA) 20 MG tablet Take 1 tablet (20 mg total) by mouth daily. 30 tablet 0   clopidogrel (PLAVIX) 75 MG tablet Take 1 tablet (75 mg total) by mouth daily. 90 tablet 1   Cyanocobalamin (VITAMIN B-12) 2500 MCG SUBL Place 1 tablet (2,500 mcg total) under the tongue daily. 30 tablet 0   cyclobenzaprine (FLEXERIL) 10 MG tablet Take 1 tablet (10 mg total) by mouth at bedtime. 30 tablet 0   diclofenac Sodium (VOLTAREN) 1 % GEL Apply 2 g topically 4 (four) times daily. (Patient taking differently: Apply 2 g topically 3 (three) times daily as needed (pain).) 100 g 0   Evolocumab (REPATHA SURECLICK) 140 MG/ML SOAJ Inject 140 mg into the skin every 14 (fourteen) days. 6 mL 3   folic acid (FOLVITE) 1 MG tablet Take 1  mg by mouth daily.     insulin glargine (LANTUS) 100 UNIT/ML Solostar Pen Inject 12 Units into the skin 2 (two) times daily. (Patient taking differently: Inject 14 Units into the skin 2 (two) times daily.) 15 mL 11   insulin lispro (HUMALOG) 100 UNIT/ML KwikPen Inject 5 Units into the skin every evening.     lisinopril (ZESTRIL) 40 MG tablet Take 1 tablet (40 mg total) by mouth every morning. 30 tablet 0   metFORMIN (GLUCOPHAGE) 500 MG tablet Take 2 tablets (1,000 mg total) by mouth 2 (two) times daily with a meal. 60 tablet 0   methotrexate (RHEUMATREX) 2.5 MG tablet TAKE 6 TABLETS BY MOUTH ONCE A WEEK -  CAUTION  -CHEMOTHERAPY  -PROTECT  FROM  LIGHT 30 tablet 0   Multiple Vitamins-Minerals (MULTIVITAMINS THER. W/MINERALS) TABS Take 1 tablet by mouth daily.     nitroGLYCERIN (NITROSTAT) 0.4 MG SL tablet Place 1 tablet (0.4 mg total) under the tongue every 5 (five) minutes x 3 doses as needed for chest pain. 25 tablet 12   Omega-3 Fatty Acids (FISH OIL) 1200 MG CAPS Take  1,200 mg by mouth in the morning and at bedtime.     predniSONE (DELTASONE) 5 MG tablet Take 1.5 tablets (7.5 mg total) by mouth daily with breakfast. 60 tablet 5   senna-docusate (SENOKOT-S) 8.6-50 MG tablet Take 1 tablet by mouth daily. 30 tablet 0   No current facility-administered medications for this visit.     Review of Systems  Please see the history of present illness.    (+) Bruising (+)  All other systems reviewed and are otherwise negative except as noted above.  Physical Exam    Wt Readings from Last 3 Encounters:  04/03/23 210 lb (95.3 kg)  12/25/22 190 lb 0.6 oz (86.2 kg)  10/04/22 190 lb (86.2 kg)   ZD:GLOVF were no vitals filed for this visit.,There is no height or weight on file to calculate BMI.  Constitutional:      Appearance: Healthy appearance. Not in distress.  Neck:     Vascular: JVD normal.  Pulmonary:     Effort: Pulmonary effort is normal.     Breath sounds: No wheezing. No rales. Diminished in the bases Cardiovascular:     Normal rate. Regular rhythm. Normal S1. Normal S2.      Murmurs: There is no murmur.  Edema:    Peripheral edema absent.  Abdominal:     Palpations: Abdomen is soft non tender. There is no hepatomegaly.  Skin:    General: Skin is warm and dry.  Slight ecchymosis Neurological:     General: No focal deficit present.     Mental Status: Alert and oriented to person, place and time.     Cranial Nerves: Cranial nerves are intact.  EKG/LABS/ Recent Cardiac Studies    ECG personally reviewed by me today -sinus rhythm with rate of 71 bpm and LVH with no acute changes consistent with previous EKG.   Risk Assessment/Calculations:            Lab Results  Component Value Date   WBC 10.5 05/23/2022   HGB 11.1 (L) 05/23/2022   HCT 33.4 (L) 05/23/2022   MCV 105 (H) 05/23/2022   PLT 292 02/14/2022   Lab Results  Component Value Date   CREATININE 0.48 (L) 05/23/2022   BUN 16 05/23/2022   NA 144 05/23/2022   K 4.1  05/23/2022   CL 102 05/23/2022   CO2 27 05/23/2022  Lab Results  Component Value Date   ALT 23 07/28/2022   AST 34 05/23/2022   ALKPHOS 49 05/23/2022   BILITOT 0.3 05/23/2022   Lab Results  Component Value Date   CHOL 112 07/28/2022   HDL 42 07/28/2022   LDLCALC 48 07/28/2022   LDLDIRECT 97.8 02/03/2022   TRIG 125 07/28/2022   CHOLHDL 2.7 07/28/2022    Lab Results  Component Value Date   HGBA1C 5.7 (H) 05/23/2022     Assessment & Plan    1.  Coronary artery disease: -s/p NSTEMI 12/2021 with PCI/DES to RCA 80% stenosed.  Today patient reports he has been doing well with no new cardiac complaints since his previous visit. -He is unfortunately not exercising and is walking 02-7999 steps daily we discussed the importance of increasing physical activity to at least 150 minutes/week. -Continue current GDMT with ASA 81 mg, Repatha, and Plavix 75 mg -He has experienced increased bruising and bleeding and has been on DAPT for 1 year and we will reach out to Dr. Raynelle Gutierrez to see if Plavix can be discontinued  2.  Essential hypertension: -Patient's blood pressure today was initially elevated at 140/78 and was 127/72 on recheck. -Continue amlodipine 10 mg, carvedilol 12.5 mg twice daily, lisinopril 40 mg daily  3.  Hyperlipidemia: -Patient's last LDL cholesterol was 84 on 57/8469 -Continue Repatha 140 mg q. 14 days  4.  DM type II: -Patient's last hemoglobin A1c was 7.6 -Patient is currently on prednisone for myopathy -Continue Lantus and Humalog per PCP  5.  Mild aortic stenosis: -Patient is euvolemic on exam and denies any shortness of breath or lower extremity edema. -01/2022 with an EF of 60-65% aortic stenosis measuring 11 mmHg -Repeat 2D echo in 2 years per Dr. Raynelle Gutierrez   Disposition: Follow-up with Rodney Constant, MD or APP in 6 months      Medication Adjustments/Labs and Tests Ordered: Current medicines are reviewed at length with the patient  today.  Concerns regarding medicines are outlined above.   Signed, Napoleon Form, Leodis Rains, NP 04/27/2023, 8:19 AM [facility]

## 2023-04-28 ENCOUNTER — Ambulatory Visit: Payer: Medicare Other | Attending: Nurse Practitioner | Admitting: Nurse Practitioner

## 2023-04-28 ENCOUNTER — Encounter: Payer: Self-pay | Admitting: Nurse Practitioner

## 2023-04-28 VITALS — BP 140/78 | HR 71 | Ht 70.0 in | Wt 205.8 lb

## 2023-04-28 DIAGNOSIS — E785 Hyperlipidemia, unspecified: Secondary | ICD-10-CM

## 2023-04-28 DIAGNOSIS — I350 Nonrheumatic aortic (valve) stenosis: Secondary | ICD-10-CM | POA: Diagnosis not present

## 2023-04-28 DIAGNOSIS — E119 Type 2 diabetes mellitus without complications: Secondary | ICD-10-CM | POA: Diagnosis not present

## 2023-04-28 DIAGNOSIS — I2510 Atherosclerotic heart disease of native coronary artery without angina pectoris: Secondary | ICD-10-CM

## 2023-04-28 DIAGNOSIS — I10 Essential (primary) hypertension: Secondary | ICD-10-CM

## 2023-04-28 NOTE — Patient Instructions (Addendum)
Medication Instructions:  Your physician recommends that you continue on your current medications as directed. Please refer to the Current Medication list given to you today. *If you need a refill on your cardiac medications before your next appointment, please call your pharmacy*   Lab Work: None ordered   Testing/Procedures: None ordered   Follow-Up: At [facility], you and your health needs are our priority.  As part of our continuing mission to provide you with exceptional heart care, we have created designated Provider Care Teams.  These Care Teams include your primary Cardiologist (physician) and Advanced Practice Providers (APPs -  Physician Assistants and Nurse Practitioners) who all work together to provide you with the care you need, when you need it.  We recommend signing up for the patient portal called "MyChart".  Sign up information is provided on this After Visit Summary.  MyChart is used to connect with patients for Virtual Visits (Telemedicine).  Patients are able to view lab/test results, encounter notes, upcoming appointments, etc.  Non-urgent messages can be sent to your provider as well.   To learn more about what you can do with MyChart, go to [email or web address].    Your next appointment:   6 month(s)  Provider:   Christell Constant, MD  or Robin Searing, NP   Other Instructions  INCREASE PHYSICAL ACTIVITY TO 150 MINUTES A WEEK

## 2023-05-01 ENCOUNTER — Telehealth: Payer: Self-pay

## 2023-05-01 NOTE — Telephone Encounter (Signed)
Left patient a detailed message, ok per DPR, with results. Advised to call back with any questions or concerns.

## 2023-05-01 NOTE — Telephone Encounter (Signed)
-----   Message from Napoleon Form, Leodis Rains sent at 04/28/2023  4:27 PM EDT ----- Please advise patient to stop Plavix 75 mg today continue ASA 81 mg daily. Please let me know if you have any further questions.  Thanks, Robin Searing, NP ----- Message ----- From: Christell Constant, MD Sent: 04/28/2023  11:08 AM EDT To: Gaston Islam., NP  Sounds good- my recommendation was to stop DAPT end of 12/2022.  Let's stop plavix now. ----- Message ----- From: Gaston Islam., NP Sent: 04/28/2023   8:31 AM EDT To: Christell Constant, MD  Dr. Raynelle Jan,  Mr. Montreuil was seen today for follow-up and has been doing well with no new cardiac complaints. He does report some increased bruising and bleeding with his insulin shots.  Would he be able to discontinue Plavix and use aspirin alone?  Thanks, Robin Searing, NP

## 2023-05-09 DIAGNOSIS — K080 Exfoliation of teeth due to systemic causes: Secondary | ICD-10-CM | POA: Diagnosis not present

## 2023-05-15 ENCOUNTER — Other Ambulatory Visit: Payer: Self-pay | Admitting: *Deleted

## 2023-05-15 MED ORDER — NITROGLYCERIN 0.4 MG SL SUBL: 0.4000 mg | SUBLINGUAL_TABLET | SUBLINGUAL | 3 refills | Status: AC | PRN

## 2023-05-27 ENCOUNTER — Other Ambulatory Visit: Payer: Self-pay | Admitting: Neurology

## 2023-06-08 DIAGNOSIS — E1121 Type 2 diabetes mellitus with diabetic nephropathy: Secondary | ICD-10-CM | POA: Diagnosis not present

## 2023-06-08 DIAGNOSIS — E669 Obesity, unspecified: Secondary | ICD-10-CM | POA: Diagnosis not present

## 2023-06-08 DIAGNOSIS — M3322 Polymyositis with myopathy: Secondary | ICD-10-CM | POA: Diagnosis not present

## 2023-06-08 DIAGNOSIS — Z23 Encounter for immunization: Secondary | ICD-10-CM | POA: Diagnosis not present

## 2023-06-08 DIAGNOSIS — I10 Essential (primary) hypertension: Secondary | ICD-10-CM | POA: Diagnosis not present

## 2023-06-13 DIAGNOSIS — C61 Malignant neoplasm of prostate: Secondary | ICD-10-CM | POA: Diagnosis not present

## 2023-06-13 DIAGNOSIS — N400 Enlarged prostate without lower urinary tract symptoms: Secondary | ICD-10-CM | POA: Diagnosis not present

## 2023-06-27 DIAGNOSIS — E119 Type 2 diabetes mellitus without complications: Secondary | ICD-10-CM | POA: Diagnosis not present

## 2023-07-04 ENCOUNTER — Other Ambulatory Visit: Payer: Self-pay | Admitting: Neurology

## 2023-07-07 DIAGNOSIS — E119 Type 2 diabetes mellitus without complications: Secondary | ICD-10-CM | POA: Diagnosis not present

## 2023-07-30 ENCOUNTER — Other Ambulatory Visit: Payer: Self-pay | Admitting: Neurology

## 2023-09-06 DIAGNOSIS — K080 Exfoliation of teeth due to systemic causes: Secondary | ICD-10-CM | POA: Diagnosis not present

## 2023-09-10 ENCOUNTER — Other Ambulatory Visit: Payer: Self-pay | Admitting: Neurology

## 2023-09-11 DIAGNOSIS — M609 Myositis, unspecified: Secondary | ICD-10-CM | POA: Diagnosis not present

## 2023-09-11 DIAGNOSIS — Z1389 Encounter for screening for other disorder: Secondary | ICD-10-CM | POA: Diagnosis not present

## 2023-09-11 DIAGNOSIS — I10 Essential (primary) hypertension: Secondary | ICD-10-CM | POA: Diagnosis not present

## 2023-09-11 DIAGNOSIS — E7800 Pure hypercholesterolemia, unspecified: Secondary | ICD-10-CM | POA: Diagnosis not present

## 2023-09-11 DIAGNOSIS — Z6831 Body mass index (BMI) 31.0-31.9, adult: Secondary | ICD-10-CM | POA: Diagnosis not present

## 2023-09-11 DIAGNOSIS — Z0000 Encounter for general adult medical examination without abnormal findings: Secondary | ICD-10-CM | POA: Diagnosis not present

## 2023-09-11 DIAGNOSIS — E1169 Type 2 diabetes mellitus with other specified complication: Secondary | ICD-10-CM | POA: Diagnosis not present

## 2023-09-12 DIAGNOSIS — I10 Essential (primary) hypertension: Secondary | ICD-10-CM | POA: Diagnosis not present

## 2023-09-12 DIAGNOSIS — Z794 Long term (current) use of insulin: Secondary | ICD-10-CM | POA: Diagnosis not present

## 2023-09-12 DIAGNOSIS — Z0000 Encounter for general adult medical examination without abnormal findings: Secondary | ICD-10-CM | POA: Diagnosis not present

## 2023-09-12 DIAGNOSIS — E559 Vitamin D deficiency, unspecified: Secondary | ICD-10-CM | POA: Diagnosis not present

## 2023-09-12 DIAGNOSIS — E1169 Type 2 diabetes mellitus with other specified complication: Secondary | ICD-10-CM | POA: Diagnosis not present

## 2023-10-09 DIAGNOSIS — C61 Malignant neoplasm of prostate: Secondary | ICD-10-CM | POA: Diagnosis not present

## 2023-10-09 NOTE — Progress Notes (Addendum)
 Chief Complaint  Patient presents with   Follow-up    Pt in room 8 alone. Here for stroke/inflammatory myopathy follow up. Pt reports doing well, still taking  prednisone .       ASSESSMENT AND PLAN  [name] is a 68 y.o. male   Inflammatory myopathy  Currently doing well on prednisone  and methotrexate , mild proximal muscle weakness and occasional imbalance but overall stable without progression  Advised to further reduce prednisone  to 5 mg daily, repeat CK level today, if remains stable, consider further reducing dosage  Continue methotrexate  2.5 mg 6 tablets weekly   Will follow up with [name] regarding need of repeat EMG/NCV  Responded well to prednisone  treatment, started as 80 mg since May 2023, with significant side effect, poorly controlled diabetes, weight gain  Not currently receiving IVIG due to insurance coverage issue  CK 210 (03/2023)  Initially, significantly elevated CPK at 2,314 in 02/2022, diagnosis was made shortly after his heart attack and stroke in 2023, at his worst, he has gait abnormality, intermittent mild swallowing difficulty  Muscle biopsy of left vastus lateralis muscle on April 19, 2022 showed no clear necrosis, inflammation, phagocytosis, there were rare regenerating fibers, type II fiber predominant, with evidence of atrophic fibers  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 or call earlier if needed  Addendum: Chart reviewed, CPK has been normalized with current combination of methotrexate , tapering dose of prednisone , will continue to taper down prednisone  from 7.5 to 5 mg daily, for 1 month, then 2.5 mg daily, until his next follow-up in July 2025   DIAGNOSTIC DATA (LABS, IMAGING, TESTING) - I reviewed patient records, labs, notes, testing and imaging myself where available. Addendum, patient was seen by [name] on April 01, 2022, pathology reported no clear necrosis, inflammation, phagocytosis, there were rare regenerating fibers, type II fiber predominant, with evidence of atrophic fibers     MEDICAL HISTORY:  Update 10/09/2022 [name]: Returns for follow-up visit.  After prior visit, repeat CK level normal at 210, recommended gradually reducing prednisone  to 5 mg daily and continue on methotrexate .   Reports overall doing well since prior visit.  Continued mild imbalance with good days and bad days, rare/occasional use of cane as needed, denies any recent falls.  Mild proximal leg weakness without any changes since prior visit.  Reports currently taking prednisone  7.5 mg daily (never decreased to 5 mg dosage), he questions further reducing due to weight gain and difficulty losing weight.  Continues on methotrexate , tolerating well.  Reports lab work by PCP in December which was all satisfactory (unable to view via epic), has follow-up visit in 2 weeks.  No new stroke/TIA symptoms.     UPDATE 04/03/2023 [name]:  Reports he had a fall back in March after tripping on object in garage resulting in a right proximal humerus fracture. Did not require surgery, recently completed PT and reports healing well.  He has routinely followed with  orthopedics.  Denies any recurrent falls since that time.  Feels overall he has been doing well since prior visit.  Does still have intermittent imbalance which is not new and will use a cane as needed.  Mild proximal  leg weakness although feels improving. Prednisone  dosage decreased after prior visit as CPK level within normal limits, currently taking 10 mg daily but concern of weight gain and difficulty with weight loss. Questions further reducing dosage.  Continues on methotrexate .  No longer receiving IVIG due to insurance coverage issue.  Denies any progression of symptoms.  UPDATE Oct 04 2022 [name]: He run out of prednisone  20mg   2 weeks prior to visit, stop it abruptly, he noticed mily change, balance is off, dizzy, light headed,  knees are weaker, arms are strong, no bulbar weakness,   UPDATE August 28th 2023 [name]: He is with his son at today's clinical visit, left breast symptomatic for muscle biopsy on April 19, 2022, showed minimum abnormal muscles suggestive of necrosis without clear inflammation, some lymphocyte as seen surrounding muscle fibers,  He received IVIG at the beginning of August 2023, tolerating it well, maintenance 1 g/kg is scheduled for August 29/30,  Methotrexate  2.5 mg 6 tablets every Monday, along with folic acid , tolerating it well  Prednisone  has been decreased to 20 mg daily since August 2023, no longer have significant side effect  He reported 75% back to baseline, ambulating without cane, still mild unsteady gait, need push-up to get up from seated position, better range of motion of upper extremity, water  therapy has been helpful   Consult visit 03/07/2022 [name]: [name] is a 68 year old male, seen in request by his primary care physician Dr. Gayl Gutierrez, to follow-up for stroke, inflammatory myopathy, initial evaluation was with her daughter on March 07, 2022,  I reviewed and summarized the referring note. PMHx. HTN CAD, April 2023, was on brilinta  DM since 2017  He has been physically active, suffered heart attack in April 2023 while pushing mowing his lawn, was noted to have elevated troponin, underwent stent, double antiplatelet treatment aspirin  and  Brilinta   During that hospital admission, he was found to have elevated abnormal liver functional test, progressive weakness since the end of 2022, his atorvastatin  20 mg was on hold since then  Hospital admission for stroke on Feb 02, 2022 for weakness, to the point his father has to help him to get into the car, laboratory evaluation showed CPK 7291, AST 181, ALT  CT of the chest abdomen and pelvis with contrast negative for evidence of metastatic disease, extensive coronary artery calcification  He was seen by neurologist, with his presentation of symmetric bilateral upper and lower extremity weakness, consistent with inflammatory myositis  MRI of the brain showed acute infarction involving ventral pontine  CT angiogram of head and neck showed right ICA stenosis measuring at least 70% proximally, 70% at the paraclinoid segment, 50% left internal carotid artery stenosis, bilateral vertebral origin stenosis measuring 50% on the right 40% on the left  Laboratory evaluations in June 2023, CPK 991, CBC showed elevated WBC 21.7 hemoglobin 13.7, BMP creatinine 0.5,  HMGCR ig antibody was significantly elevated 97 units, which may be associated with necrotizing autoimmune myopathy in a subset of statin treated patients, although infrequent, this antibody may also be observed in statin nave patient with necrotizing autoimmune myopathy,  Normal magnesium, phosphate, elevated ALT 262, AST 65, A1c 7.4, LDL was 97.8, normal TSH, negative HIV, negative D-dimer, lipase,  MRI  of cervical spine showed multilevel degenerative changes, no significant canal stenosis, C3-4 severe left, C5-6 severe right, C6-7 severe left and moderate right neuroforaminal stenosis  He was treated with Solu-Medrol  1 g daily for 3 days followed by p.o. prednisone  tapering starting from 80 mg daily, decrement of 10 mg every 1 week, will be taking 50 mg March 08, 2022  Case was discussed with Dr. Burnard Banter of general surgeon,  planning on having muscle biopsy, he was discharged to rehabilitation, follow-up visit did showed improvement of his muscle weakness, was able to ambulate, the response to steroid also favored the diagnosis of inflammatory myopathy, he was discharged to inpatient rehabilitation, discharged on Feb 16, 2022, he now lives alone at home, ambulated with a cane  Emergency presentation 3 days later Feb 19, 2022, fell, hit his left frontal region on the back,  CT head showed no acute abnormality  He was not on any antiplatelet agent prior to hospital admission, now taking aspirin  and Plavix , he has some improvement, but far from his baseline, has difficulty raising arm overhead, could not put plate in the cabinet, gait abnormality,        PHYSICAL EXAM:   Vitals:   10/10/23 1053  BP: 138/71  Pulse: 70  Weight: 207 lb (93.9 kg)  Height: 5' 10 (1.778 m)   Body mass index is 29.7 kg/m.  PHYSICAL EXAMNIATION:  Gen: NAD, very pleasant middle-age Caucasian male, conversant, well nourised, well groomed                     Cardiovascular: Regular rate rhythm, no peripheral edema, warm, nontender. Eyes: Conjunctivae clear without exudates or hemorrhage Neck: Supple, no carotid bruits. Pulmonary: Clear to auscultation bilaterally   NEUROLOGICAL EXAM:  MENTAL STATUS: Speech/cognition: Awake, alert, oriented to history taking and casual conversation CRANIAL NERVES: CN II: Visual fields are full to confrontation. Pupils are round equal and briskly reactive to light. CN III, IV, VI: extraocular movement are normal. No ptosis. CN V: Facial sensation is intact to light touch CN VII: Face is symmetric with normal eye closure  CN VIII: Hearing is normal to causal conversation. CN IX, X: Phonation is normal. CN XI: Head turning and shoulder shrug are intact  MOTOR: Limited range of motion of right shoulder due to prior rotator cuff injury. Mild L>R hip flexor weakness  REFLEXES: Reflexes are  mildly hyperreflexia on left upper extremity, patella, bilateral Babinski sign   SENSORY: Intact to light touch, pinprick and vibratory sensation are intact in fingers and toes.  COORDINATION: There is no trunk or limb dysmetria noted.  GAIT/STANCE: Can get up from seated position with arms crossed, wide-based, cautious gait, difficulty performing tandem walk and heel toe.  Ambulating without AD  REVIEW OF SYSTEMS:  Full 14 system review of systems performed and notable only for as above All other review of systems were negative.   ALLERGIES: Allergies  Allergen Reactions   Atorvastatin  Calcium  Other (See Comments)    Statin induced myopathy and transaminitis    HOME MEDICATIONS: Current Outpatient Medications  Medication Sig Dispense Refill   acetaminophen  (TYLENOL ) 325 MG tablet Take 1-2 tablets (325-650 mg total) by mouth every 4 (four) hours as needed for mild pain.     amLODipine  (NORVASC ) 10 MG tablet Take 1 tablet (10 mg total) by mouth every morning. 30 tablet 0   aspirin  81 MG EC tablet Take 1 tablet (81 mg total) by mouth daily. Swallow whole. 90 tablet 3  carvedilol  (COREG ) 12.5 MG tablet Take 1 tablet (12.5 mg total) by mouth 2 (two) times daily with a meal. (Patient taking differently: Take 12.5 mg by mouth 2 (two) times daily with a meal. Takes 1 1/2 pill BID) 180 tablet 1   cholecalciferol  (VITAMIN D ) 1000 units tablet Take 1 tablet (1,000 Units total) by mouth daily. 30 tablet 0   citalopram  (CELEXA ) 20 MG tablet Take 1 tablet (20 mg total) by mouth daily. 30 tablet 0   Cyanocobalamin  (VITAMIN B-12) 2500 MCG SUBL Place 1 tablet (2,500 mcg total) under the tongue daily. 30 tablet 0   cyclobenzaprine  (FLEXERIL ) 10 MG tablet Take 1 tablet (10 mg total) by mouth at bedtime. 30 tablet 0   diclofenac  Sodium (VOLTAREN ) 1 % GEL Apply 2 g topically 4 (four) times daily. (Patient taking differently: Apply 2 g topically 3 (three) times daily as needed (pain).) 100 g 0    Evolocumab  (REPATHA  SURECLICK) 140 MG/ML SOAJ Inject 140 mg into the skin every 14 (fourteen) days. 6 mL 3   folic acid  (FOLVITE ) 1 MG tablet Take 1 mg by mouth daily.     insulin  glargine (LANTUS ) 100 UNIT/ML Solostar Pen Inject 12 Units into the skin 2 (two) times daily. (Patient taking differently: Inject 14 Units into the skin 2 (two) times daily.) 15 mL 11   insulin  lispro (HUMALOG) 100 UNIT/ML KwikPen Inject 5 Units into the skin every evening.     lisinopril  (ZESTRIL ) 40 MG tablet Take 1 tablet (40 mg total) by mouth every morning. 30 tablet 0   metFORMIN  (GLUCOPHAGE ) 500 MG tablet Take 2 tablets (1,000 mg total) by mouth 2 (two) times daily with a meal. 60 tablet 0   methotrexate  (RHEUMATREX) 2.5 MG tablet TAKE 6 TABLETS BY MOUTH ONCE A WEEK *CAUTION  CHEMOTHERAPY-  PROTECT  FROM  LIGHT* 30 tablet 0   Multiple Vitamins-Minerals (MULTIVITAMINS THER. W/MINERALS) TABS Take 1 tablet by mouth daily.     nitroGLYCERIN  (NITROSTAT ) 0.4 MG SL tablet Place 1 tablet (0.4 mg total) under the tongue every 5 (five) minutes x 3 doses as needed for chest pain. 25 tablet 3   Omega-3 Fatty Acids (FISH OIL) 1200 MG CAPS Take 1,200 mg by mouth in the morning and at bedtime.     predniSONE  (DELTASONE ) 5 MG tablet Take 1.5 tablets (7.5 mg total) by mouth daily with breakfast. 60 tablet 5   senna-docusate (SENOKOT-S) 8.6-50 MG tablet Take 1 tablet by mouth daily. 30 tablet 0   No current facility-administered medications for this visit.    PAST MEDICAL HISTORY: Past Medical History:  Diagnosis Date   Aortic stenosis    mild AS 02/04/22   Arthritis    Diabetes mellitus    fasting 120s   History of kidney stones    Hypertension    Myocardial infarction (HCC)    stent placed   Peripheral neuropathy    left fifth finger, left toe   Pneumonia    hx of    PAST SURGICAL HISTORY: Past Surgical History:  Procedure Laterality Date   APPENDECTOMY     CORONARY STENT INTERVENTION N/A 12/28/2021   Procedure:  CORONARY STENT INTERVENTION;  Surgeon: Dann Candyce RAMAN, MD;  Location: [facility] INVASIVE CV [facility];  Service: Cardiovascular;  Laterality: N/A;   CORONARY ULTRASOUND/IVUS N/A 12/28/2021   Procedure: Intravascular Ultrasound/IVUS;  Surgeon: Dann Candyce RAMAN, MD;  Location: [facility] INVASIVE CV LAB;  Service: Cardiovascular;  Laterality: N/A;   CYSTOSCOPY W/ URETERAL STENT PLACEMENT Bilateral 03/17/2014  Procedure: CYSTOSCOPY WITH RIGHT STENT PULL  ;  Surgeon: Ricardo Likens, MD;  Location: [facility];  Service: Urology;  Laterality: Bilateral;   CYSTOSCOPY WITH RETROGRADE PYELOGRAM, URETEROSCOPY AND STENT PLACEMENT Bilateral 02/01/2014   Procedure: CYSTOSCOPY WITH BILATERAL RETROGRADE CYSTOSCOPY, BILATERAL RETROGRADE, PYELOGRAM, RIGHT URETEROSCOPY, STONE BASKETRY,  AND BILATERAL STENT PLACEMENT;  Surgeon: Ricardo Likens, MD;  Location: [facility];  Service: Urology;  Laterality: Bilateral;   CYSTOSCOPY WITH URETEROSCOPY AND STENT PLACEMENT Left 02/12/2021   Procedure: CYSTOSCOPY WITH LEFT URETEROSCOPY, BALLOON DILITATION, HOLMIUM LASER LITHOTRIPSY AND STENT PLACEMENT;  Surgeon: Carolee Sherwood JONETTA DOUGLAS, MD;  Location: [facility];  Service: Urology;  Laterality: Left;   HOLMIUM LASER APPLICATION Left 03/19/2014   Procedure: HOLMIUM LASER APPLICATION;  Surgeon: Ricardo Likens, MD;  Location: [facility];  Service: Urology;  Laterality: Left;   LEFT HEART CATH AND CORONARY ANGIOGRAPHY N/A 12/28/2021   Procedure: LEFT HEART CATH AND CORONARY ANGIOGRAPHY;  Surgeon: Dann Candyce RAMAN, MD;  Location: [facility] INVASIVE CV LAB;  Service: Cardiovascular;  Laterality: N/A;   LITHOTRIPSY     MUSCLE BIOPSY Left 04/19/2022   Procedure: LEFT THIGH MUSCLE BIOPSY;  Surgeon: Vanderbilt Ned, MD;  Location: [facility] OR;  Service: General;  Laterality: Left;   NEPHROLITHOTOMY Left 03/17/2014   Procedure: NEPHROLITHOTOMY PERCUTANEOUS with access;  Surgeon: Ricardo Likens, MD;  Location: [facility];  Service: Urology;  Laterality: Left;   NEPHROLITHOTOMY Left 03/19/2014    Procedure: 2ND STAGE NEPHROLITHOTOMY PERCUTANEOUS left ureteroscopy with lasr stone treatment and stent placement, with access;  Surgeon: Ricardo Likens, MD;  Location: [facility];  Service: Urology;  Laterality: Left;   TOTAL KNEE ARTHROPLASTY Right 09/02/2014   Procedure: RIGHT TOTAL KNEE ARTHROPLASTY;  Surgeon: Cordella Glendia Hutchinson, MD;  Location: [facility] OR;  Service: Orthopedics;  Laterality: Right;   uretral stent      FAMILY HISTORY: Family History  Problem Relation Age of Onset   Alzheimer's disease Mother    Heart attack Father     SOCIAL HISTORY: Social History   Socioeconomic History   Marital status: Widowed    Spouse name: Not on file   Number of children: 2   Years of education: 3 yeae   Highest education level: Not on file  Occupational History   Not on file  Tobacco Use   Smoking status: Never   Smokeless tobacco: Never  Vaping Use   Vaping status: Never Used  Substance and Sexual Activity   Alcohol  use: Yes    Alcohol /week: 1.0 standard drink of alcohol     Types: 1 Cans of beer per week    Comment: occasional/ 1 beers week   Drug use: No   Sexual activity: Yes    Birth control/protection: Other-see comments  Other Topics Concern   Not on file  Social History Narrative   Not on file   Social Drivers of Health   Financial Resource Strain: Not on file  Food Insecurity: Not on file  Transportation Needs: Not on file  Physical Activity: Not on file  Stress: Not on file  Social Connections: Unknown (02/07/2022)   Received from [facility]   Social Network    Social Network: Not on file  Intimate Partner Violence: Unknown (12/30/2021)   Received from [facility]   HITS    Physically Hurt: Not on file    Insult or Talk Down To: Not on file    Threaten Physical Harm: Not on file    Scream or Curse: Not on file  I spent 31 minutes of face-to-face and non-face-to-face time with patient.  This included previsit chart review,  lab review, study review, order entry, electronic health record documentation, patient education and discussion regarding above diagnoses, treatment plan and answered all other questions to patient satisfaction  Harlene Bogaert, [facility]  [facility] [address] [place], [place] [address]  Phone [phone] Fax [phone] Note: This document was prepared with digital dictation and possible smart phrase technology. Any transcriptional errors that result from this process are unintentional.

## 2023-10-10 ENCOUNTER — Ambulatory Visit: Payer: Medicare Other | Admitting: Adult Health

## 2023-10-10 ENCOUNTER — Encounter: Payer: Self-pay | Admitting: Adult Health

## 2023-10-10 VITALS — BP 138/71 | HR 70 | Ht 70.0 in | Wt 207.0 lb

## 2023-10-10 DIAGNOSIS — I639 Cerebral infarction, unspecified: Secondary | ICD-10-CM

## 2023-10-10 DIAGNOSIS — R269 Unspecified abnormalities of gait and mobility: Secondary | ICD-10-CM

## 2023-10-10 DIAGNOSIS — G7249 Other inflammatory and immune myopathies, not elsewhere classified: Secondary | ICD-10-CM | POA: Diagnosis not present

## 2023-10-10 NOTE — Patient Instructions (Signed)
 Your Plan:  Continue methotrexate  at current dosage  Continue prednisone , will follow up with [name] regarding further reducing dosage, I will keep you updated via MyChart regarding her recommendations  Continue to follow with your PCP for stroke risk factor management     Follow-up with [name] in 6 months or call earlier if needed     Thank you for coming to see us  at [facility]. I hope we have been able to provide you high quality care today.  You may receive a patient satisfaction survey over the next few weeks. We would appreciate your feedback and comments so that we may continue to improve ourselves and the health of our patients.

## 2023-10-11 ENCOUNTER — Encounter: Payer: Self-pay | Admitting: Adult Health

## 2023-10-11 LAB — CK: Total CK: 297 U/L (ref 41–331)

## 2023-10-14 ENCOUNTER — Other Ambulatory Visit: Payer: Self-pay | Admitting: Neurology

## 2023-10-17 DIAGNOSIS — K080 Exfoliation of teeth due to systemic causes: Secondary | ICD-10-CM | POA: Diagnosis not present

## 2023-10-26 DIAGNOSIS — M3322 Polymyositis with myopathy: Secondary | ICD-10-CM | POA: Diagnosis not present

## 2023-10-26 DIAGNOSIS — E1169 Type 2 diabetes mellitus with other specified complication: Secondary | ICD-10-CM | POA: Diagnosis not present

## 2023-10-26 DIAGNOSIS — I10 Essential (primary) hypertension: Secondary | ICD-10-CM | POA: Diagnosis not present

## 2023-10-26 DIAGNOSIS — E669 Obesity, unspecified: Secondary | ICD-10-CM | POA: Diagnosis not present

## 2023-10-30 DIAGNOSIS — D044 Carcinoma in situ of skin of scalp and neck: Secondary | ICD-10-CM | POA: Diagnosis not present

## 2023-10-30 DIAGNOSIS — L814 Other melanin hyperpigmentation: Secondary | ICD-10-CM | POA: Diagnosis not present

## 2023-10-30 DIAGNOSIS — L821 Other seborrheic keratosis: Secondary | ICD-10-CM | POA: Diagnosis not present

## 2023-10-30 DIAGNOSIS — D225 Melanocytic nevi of trunk: Secondary | ICD-10-CM | POA: Diagnosis not present

## 2023-10-30 DIAGNOSIS — D2262 Melanocytic nevi of left upper limb, including shoulder: Secondary | ICD-10-CM | POA: Diagnosis not present

## 2023-11-08 DIAGNOSIS — K080 Exfoliation of teeth due to systemic causes: Secondary | ICD-10-CM | POA: Diagnosis not present

## 2023-11-16 ENCOUNTER — Other Ambulatory Visit: Payer: Self-pay | Admitting: Neurology

## 2023-11-23 ENCOUNTER — Other Ambulatory Visit: Payer: Self-pay | Admitting: Neurology

## 2023-11-24 NOTE — Telephone Encounter (Signed)
 Rx was approved on 11/20/23 and sent to [facility].

## 2023-12-18 DIAGNOSIS — J069 Acute upper respiratory infection, unspecified: Secondary | ICD-10-CM | POA: Diagnosis not present

## 2023-12-23 ENCOUNTER — Other Ambulatory Visit: Payer: Self-pay | Admitting: Neurology

## 2024-01-06 ENCOUNTER — Other Ambulatory Visit: Payer: Self-pay | Admitting: Adult Health

## 2024-01-11 ENCOUNTER — Encounter: Payer: Self-pay | Admitting: Neurology

## 2024-01-22 ENCOUNTER — Other Ambulatory Visit: Payer: Self-pay | Admitting: Neurology

## 2024-01-25 DIAGNOSIS — E1169 Type 2 diabetes mellitus with other specified complication: Secondary | ICD-10-CM | POA: Diagnosis not present

## 2024-01-25 DIAGNOSIS — E7800 Pure hypercholesterolemia, unspecified: Secondary | ICD-10-CM | POA: Diagnosis not present

## 2024-01-25 DIAGNOSIS — I25810 Atherosclerosis of coronary artery bypass graft(s) without angina pectoris: Secondary | ICD-10-CM | POA: Diagnosis not present

## 2024-01-25 DIAGNOSIS — I10 Essential (primary) hypertension: Secondary | ICD-10-CM | POA: Diagnosis not present

## 2024-01-25 DIAGNOSIS — G720 Drug-induced myopathy: Secondary | ICD-10-CM | POA: Diagnosis not present

## 2024-02-29 NOTE — Telephone Encounter (Signed)
 Pt has called back and r/s the appointment

## 2024-03-03 ENCOUNTER — Other Ambulatory Visit: Payer: Self-pay | Admitting: Neurology

## 2024-03-31 ENCOUNTER — Other Ambulatory Visit: Payer: Self-pay | Admitting: Neurology

## 2024-04-06 ENCOUNTER — Other Ambulatory Visit: Payer: Self-pay | Admitting: Adult Health

## 2024-04-08 ENCOUNTER — Ambulatory Visit: Payer: Medicare Other | Admitting: Neurology

## 2024-04-08 NOTE — Telephone Encounter (Signed)
 Did you want patent to take this one day of 2.5mg  or is patient taking this twice a day . Pt has an follow up visit on 04/15/24

## 2024-04-11 DIAGNOSIS — N5201 Erectile dysfunction due to arterial insufficiency: Secondary | ICD-10-CM | POA: Diagnosis not present

## 2024-04-11 DIAGNOSIS — C61 Malignant neoplasm of prostate: Secondary | ICD-10-CM | POA: Diagnosis not present

## 2024-04-15 ENCOUNTER — Ambulatory Visit: Admitting: Neurology

## 2024-04-15 ENCOUNTER — Encounter: Payer: Self-pay | Admitting: Neurology

## 2024-04-15 VITALS — BP 110/76 | Ht 70.0 in | Wt 201.0 lb

## 2024-04-15 DIAGNOSIS — G7249 Other inflammatory and immune myopathies, not elsewhere classified: Secondary | ICD-10-CM

## 2024-04-15 DIAGNOSIS — I639 Cerebral infarction, unspecified: Secondary | ICD-10-CM | POA: Diagnosis not present

## 2024-04-15 MED ORDER — METHOTREXATE SODIUM 2.5 MG PO TABS: 15.0000 mg | ORAL_TABLET | ORAL | 11 refills | Status: DC

## 2024-04-15 NOTE — Progress Notes (Signed)
 Chief Complaint  Patient presents with   Follow-up    Rm 15, f/u, changes to report      ASSESSMENT AND PLAN  [name] is a 68 y.o. male   Inflammatory myopathy  Significantly elevated CPK, diagnosis was made shortly after his heart attack and stroke in 2023, at his worst, he has gait abnormality, intermittent mild swallowing difficulty  Responding well to prednisone  treatment, started as 80 mg since May 2023, with significant side effect, poorly controlled diabetes, tolerating slow tapering down of the dose without recurrent weakness  Methotrexate  2.5 mg 6 tablets since June 2023,  Was treated with IVIG in August 2023, responding well,  Muscle biopsy of left vastus lateralis muscle on April 19, 2022 (after treatment) 2023 showed no clear necrosis, inflammation, phagocytosis, there were rare regenerating fibers, type II fiber predominant, with evidence of atrophic fibers   He complains of excessive weight gain with long-term prednisone  treatment, CPK has been normalized, will stop prednisone , keep methotrexate  15 mg weekly   Mild gait abnormality  Likely combination of proximal muscle weakness, residual mild left hemiparesis from right pontine stroke, right artificial knee, left knee pain,   Right pontine stroke Coronary artery disease  With mild residual left upper and lower extremity weakness, hyperreflexia on the left side,  Vascular risk factor of aging, hypertension, hyperlipidemia, diabetes  On aspirin  daily    RTC in 6 months, lab today  DIAGNOSTIC DATA (LABS, IMAGING, TESTING) - I reviewed patient records, labs, notes, testing and imaging myself where available. Addendum, patient was seen by Dr. [name] on April 01, 2022, pathology reported no clear necrosis, inflammation, phagocytosis, there were rare regenerating fibers, type II fiber predominant, with evidence of atrophic fibers  MEDICAL HISTORY:  [name] is a 68 year old male, seen in  request by his primary care physician Dr. [name], to follow-up for stroke, inflammatory myopathy, initial evaluation was with her daughter on March 07, 2022,  I reviewed and summarized the referring note. PMHx. HTN CAD, April 2023, was on brilinta  DM since 2017  He has been physically active, suffered heart attack in April 2023 while pushing mowing his lawn, was noted to have elevated troponin, underwent stent, double antiplatelet treatment aspirin  and Brilinta   During that hospital admission, he was found to have elevated abnormal liver functional test, progressive weakness since the end of 2022, his atorvastatin  20 mg was on hold since then  Hospital admission for stroke on Feb 02, 2022 for weakness, to the point his father has to help him to get into the car, laboratory evaluation showed CPK 7291, AST 181, ALT  CT of the chest abdomen and pelvis with contrast negative for evidence of metastatic disease, extensive coronary artery calcification  He was seen by neurologist, with his presentation of symmetric bilateral upper and lower extremity weakness, consistent with inflammatory myositis  MRI of the brain showed acute infarction involving ventral pontine  CT angiogram of head and neck showed right ICA stenosis measuring at least 70% proximally, 70% at the paraclinoid segment, 50% left internal carotid artery stenosis, bilateral vertebral origin stenosis measuring 50% on the right 40% on the left  Laboratory evaluations in June 2023, CPK 991, CBC showed elevated WBC 21.7 hemoglobin 13.7, BMP creatinine 0.5,  HMGCR ig antibody was significantly elevated 97 units, which may be associated with necrotizing autoimmune myopathy in a subset of statin treated patients, although infrequent, this antibody may also be observed in statin nave patient with necrotizing  autoimmune myopathy,  Normal magnesium, phosphate, elevated ALT 262, AST 65, A1c 7.4, LDL was 97.8, normal TSH, negative HIV,  negative D-dimer, lipase,  MRI of cervical spine showed multilevel degenerative changes, no significant canal stenosis, C3-4 severe left, C5-6 severe right, C6-7 severe left and moderate right neuroforaminal stenosis  He was treated with Solu-Medrol  1 g daily for 3 days followed by p.o. prednisone  tapering starting from 80 mg daily, decrement of 10 mg every 1 week, will be taking 50 mg March 08, 2022  Case was discussed with Dr. Burnard Banter of general surgeon, planning on having muscle biopsy, he was discharged to rehabilitation, follow-up visit did showed improvement of his muscle weakness, was able to ambulate, the response to steroid also favored the diagnosis of inflammatory myopathy, he was discharged to inpatient rehabilitation, discharged on Feb 16, 2022, he now lives alone at home, ambulated with a cane  Emergency presentation 3 days later Feb 19, 2022, fell, hit his left frontal region on the back,  CT head showed no acute abnormality  He was not on any antiplatelet agent prior to hospital admission, now taking aspirin  and Plavix , he has some improvement, but far from his baseline, has difficulty raising arm overhead, could not put plate in the cabinet, gait abnormality,  UPDATE August 28th 2023: He is with his son at today's clinical visit, left vastus lateralis muscle biopsy on April 19, 2022, showed minimum abnormal muscles suggestive of necrosis without clear inflammation, some lymphocyte as seen surrounding muscle fibers,  He received IVIG at the beginning of August 2023, tolerating it well, maintenance 1 g/kg is scheduled for August 29/30,  Methotrexate  2.5 mg 6 tablets every Monday, along with folic acid , tolerating it well  Prednisone  has been decreased to 20 mg daily since August 2023, no longer have significant side effect  He reported 75% back to baseline, ambulating without cane, still mild unsteady gait, need push-up to get up from seated position, better range of motion  of upper extremity, water  therapy has been helpful  UPDATE Oct 04 2022: He run out of prednisone  20mg   2 weeks prior to visit, stop it abruptly, he noticed mily change, balance is off, dizzy, light headed,  knees are weaker, arms are strong, no bulbar weakness,   UPDATE April 15 2024: He was followed up by nurse practitioner last for visit, overall doing better, on tapering dose of prednisone , has been on 2.5 mg for 6 months, no recurrent weakness, also on methotrexate  2.5 mg 6 tablets weekly  He denies bulbar weakness, but complains of fatigue, he works a Health and safety inspector job, walking about 7000 steps daily, swims regularly   PHYSICAL EXAM:   Vitals:   04/15/24 1004  BP: 110/76  Weight: 201 lb (91.2 kg)  Height: 5' 10 (1.778 m)   Body mass index is 28.84 kg/m.  PHYSICAL EXAMNIATION:  Gen: NAD, conversant, well nourised, well groomed                     Cardiovascular: Regular rate rhythm, no peripheral edema, warm, nontender. Eyes: Conjunctivae clear without exudates or hemorrhage Neck: Supple, no carotid bruits. Pulmonary: Clear to auscultation bilaterally   NEUROLOGICAL EXAM:  MENTAL STATUS: Speech/cognition: Obese, awake, alert, oriented to history taking and casual conversation CRANIAL NERVES: CN II: Visual fields are full to confrontation. Pupils are round equal and briskly reactive to light. CN III, IV, VI: extraocular movement are normal. No ptosis. CN V: Facial sensation is intact to light touch CN  VII: Face is symmetric with normal eye closure  CN VIII: Hearing is normal to causal conversation. CN IX, X: Phonation is normal. CN XI: Head turning and shoulder shrug are intact  MOTOR: No neck flexion weakness, mild bilateral shoulder abduction, external rotation weakness no significant lower extremity weakness    REFLEXES: Reflexes are mildly hyperreflexia on left upper extremity, patella, bilateral Babinski sign   SENSORY: Intact to light touch, pinprick and vibratory  sensation are intact in fingers and toes.  COORDINATION: There is no trunk or limb dysmetria noted.  GAIT/STANCE: Can get up from seated position arm crossed mildly cautious gait  REVIEW OF SYSTEMS:  Full 14 system review of systems performed and notable only for as above All other review of systems were negative.   ALLERGIES: Allergies  Allergen Reactions   Atorvastatin  Calcium  Other (See Comments)    Statin induced myopathy and transaminitis    HOME MEDICATIONS: Current Outpatient Medications  Medication Sig Dispense Refill   acetaminophen  (TYLENOL ) 325 MG tablet Take 1-2 tablets (325-650 mg total) by mouth every 4 (four) hours as needed for mild pain.     amLODipine  (NORVASC ) 10 MG tablet Take 1 tablet (10 mg total) by mouth every morning. 30 tablet 0   aspirin  81 MG EC tablet Take 1 tablet (81 mg total) by mouth daily. Swallow whole. 90 tablet 3   carvedilol  (COREG ) 12.5 MG tablet Take 1 tablet (12.5 mg total) by mouth 2 (two) times daily with a meal. (Patient taking differently: Take 12.5 mg by mouth 2 (two) times daily with a meal. Takes 1 1/2 pill BID) 180 tablet 1   Cyanocobalamin  (VITAMIN B-12) 2500 MCG SUBL Place 1 tablet (2,500 mcg total) under the tongue daily. 30 tablet 0   cyclobenzaprine  (FLEXERIL ) 10 MG tablet Take 1 tablet (10 mg total) by mouth at bedtime. 30 tablet 0   diclofenac  Sodium (VOLTAREN ) 1 % GEL Apply 2 g topically 4 (four) times daily. (Patient taking differently: Apply 2 g topically 3 (three) times daily as needed (pain).) 100 g 0   folic acid  (FOLVITE ) 1 MG tablet Take 1 mg by mouth daily.     insulin  glargine (LANTUS ) 100 UNIT/ML Solostar Pen Inject 12 Units into the skin 2 (two) times daily. (Patient taking differently: Inject 20 Units into the skin 2 (two) times daily.) 15 mL 11   insulin  lispro (HUMALOG) 100 UNIT/ML KwikPen Inject 5 Units into the skin every evening.     lisinopril  (ZESTRIL ) 40 MG tablet Take 1 tablet (40 mg total) by mouth every  morning. 30 tablet 0   metFORMIN  (GLUCOPHAGE ) 500 MG tablet Take 2 tablets (1,000 mg total) by mouth 2 (two) times daily with a meal. 60 tablet 0   methotrexate  (RHEUMATREX) 2.5 MG tablet TAKE 6 TABLETS BY MOUTH ONCE A WEEK 30 tablet 0   Multiple Vitamins-Minerals (MULTIVITAMINS THER. W/MINERALS) TABS Take 1 tablet by mouth daily.     nitroGLYCERIN  (NITROSTAT ) 0.4 MG SL tablet Place 1 tablet (0.4 mg total) under the tongue every 5 (five) minutes x 3 doses as needed for chest pain. 25 tablet 3   Omega-3 Fatty Acids (FISH OIL) 1200 MG CAPS Take 1,200 mg by mouth in the morning and at bedtime.     predniSONE  (DELTASONE ) 2.5 MG tablet Take 1 tablet (2.5 mg total) by mouth daily. 30 tablet 11   senna-docusate (SENOKOT-S) 8.6-50 MG tablet Take 1 tablet by mouth daily. 30 tablet 0   cholecalciferol  (VITAMIN D ) 1000 units tablet  Take 1 tablet (1,000 Units total) by mouth daily. 30 tablet 0   citalopram  (CELEXA ) 20 MG tablet Take 1 tablet (20 mg total) by mouth daily. 30 tablet 0   Evolocumab  (REPATHA  SURECLICK) 140 MG/ML SOAJ Inject 140 mg into the skin every 14 (fourteen) days. 6 mL 3   No current facility-administered medications for this visit.    PAST MEDICAL HISTORY: Past Medical History:  Diagnosis Date   Aortic stenosis    mild AS 02/04/22   Arthritis    Diabetes mellitus    fasting 120s   History of kidney stones    Hypertension    Myocardial infarction (HCC)    stent placed   Peripheral neuropathy    left fifth finger, left toe   Pneumonia    hx of    PAST SURGICAL HISTORY: Past Surgical History:  Procedure Laterality Date   APPENDECTOMY     CORONARY STENT INTERVENTION N/A 12/28/2021   Procedure: CORONARY STENT INTERVENTION;  Surgeon: Dann Candyce RAMAN, MD;  Location: [facility] INVASIVE CV LAB;  Service: Cardiovascular;  Laterality: N/A;   CORONARY ULTRASOUND/IVUS N/A 12/28/2021   Procedure: Intravascular Ultrasound/IVUS;  Surgeon: Dann Candyce RAMAN, MD;  Location: [facility] INVASIVE CV  LAB;  Service: Cardiovascular;  Laterality: N/A;   CYSTOSCOPY W/ URETERAL STENT PLACEMENT Bilateral 03/17/2014   Procedure: CYSTOSCOPY WITH RIGHT STENT PULL  ;  Surgeon: Ricardo Likens, MD;  Location: [facility] ORS;  Service: Urology;  Laterality: Bilateral;   CYSTOSCOPY WITH RETROGRADE PYELOGRAM, URETEROSCOPY AND STENT PLACEMENT Bilateral 02/01/2014   Procedure: CYSTOSCOPY WITH BILATERAL RETROGRADE CYSTOSCOPY, BILATERAL RETROGRADE, PYELOGRAM, RIGHT URETEROSCOPY, STONE BASKETRY,  AND BILATERAL STENT PLACEMENT;  Surgeon: Ricardo Likens, MD;  Location: [facility];  Service: Urology;  Laterality: Bilateral;   CYSTOSCOPY WITH URETEROSCOPY AND STENT PLACEMENT Left 02/12/2021   Procedure: CYSTOSCOPY WITH LEFT URETEROSCOPY, BALLOON DILITATION, HOLMIUM LASER LITHOTRIPSY AND STENT PLACEMENT;  Surgeon: Carolee Sherwood JONETTA DOUGLAS, MD;  Location: [facility];  Service: Urology;  Laterality: Left;   HOLMIUM LASER APPLICATION Left 03/19/2014   Procedure: HOLMIUM LASER APPLICATION;  Surgeon: Ricardo Likens, MD;  Location: [facility];  Service: Urology;  Laterality: Left;   LEFT HEART CATH AND CORONARY ANGIOGRAPHY N/A 12/28/2021   Procedure: LEFT HEART CATH AND CORONARY ANGIOGRAPHY;  Surgeon: Dann Candyce RAMAN, MD;  Location: [facility] INVASIVE CV LAB;  Service: Cardiovascular;  Laterality: N/A;   LITHOTRIPSY     MUSCLE BIOPSY Left 04/19/2022   Procedure: LEFT THIGH MUSCLE BIOPSY;  Surgeon: Vanderbilt Ned, MD;  Location: [facility] OR;  Service: General;  Laterality: Left;   NEPHROLITHOTOMY Left 03/17/2014   Procedure: NEPHROLITHOTOMY PERCUTANEOUS with access;  Surgeon: Ricardo Likens, MD;  Location: [facility];  Service: Urology;  Laterality: Left;   NEPHROLITHOTOMY Left 03/19/2014   Procedure: 2ND STAGE NEPHROLITHOTOMY PERCUTANEOUS left ureteroscopy with lasr stone treatment and stent placement, with access;  Surgeon: Ricardo Likens, MD;  Location: [facility];  Service: Urology;  Laterality: Left;   TOTAL KNEE ARTHROPLASTY Right 09/02/2014   Procedure: RIGHT TOTAL  KNEE ARTHROPLASTY;  Surgeon: Cordella Glendia Hutchinson, MD;  Location: [facility] OR;  Service: Orthopedics;  Laterality: Right;   uretral stent      FAMILY HISTORY: Family History  Problem Relation Age of Onset   Alzheimer's disease Mother    Heart attack Father     SOCIAL HISTORY: Social History   Socioeconomic History   Marital status: Widowed    Spouse name: Not on file   Number of children: 2   Years of education: 3  yeae   Highest education level: Not on file  Occupational History   Not on file  Tobacco Use   Smoking status: Never   Smokeless tobacco: Never  Vaping Use   Vaping status: Never Used  Substance and Sexual Activity   Alcohol  use: Yes    Alcohol /week: 1.0 standard drink of alcohol     Types: 1 Cans of beer per week    Comment: occasional/ 1 beers week   Drug use: No   Sexual activity: Yes    Birth control/protection: Other-see comments  Other Topics Concern   Not on file  Social History Narrative   Left handed   Caffeine-none   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 Ph.D.  [facility] [address] [place], [place] [address] Ph: [phone] Fax: [phone]  CC:  [name], MD [address] [place],  [place] [address]  [name], MD

## 2024-04-16 ENCOUNTER — Ambulatory Visit: Payer: Self-pay | Admitting: Neurology

## 2024-04-16 LAB — COMPREHENSIVE METABOLIC PANEL WITH GFR
ALT: 28 IU/L (ref 0–44)
AST: 19 IU/L (ref 0–40)
Albumin: 4.5 g/dL (ref 3.9–4.9)
Alkaline Phosphatase: 71 IU/L (ref 44–121)
BUN/Creatinine Ratio: 27 — ABNORMAL HIGH (ref 10–24)
BUN: 20 mg/dL (ref 8–27)
Bilirubin Total: 0.4 mg/dL (ref 0.0–1.2)
CO2: 22 mmol/L (ref 20–29)
Calcium: 10.1 mg/dL (ref 8.6–10.2)
Chloride: 106 mmol/L (ref 96–106)
Creatinine, Ser: 0.74 mg/dL — ABNORMAL LOW (ref 0.76–1.27)
Globulin, Total: 2.5 g/dL (ref 1.5–4.5)
Glucose: 79 mg/dL (ref 70–99)
Potassium: 5.4 mmol/L — ABNORMAL HIGH (ref 3.5–5.2)
Sodium: 145 mmol/L — ABNORMAL HIGH (ref 134–144)
Total Protein: 7 g/dL (ref 6.0–8.5)
eGFR: 99 mL/min/1.73 (ref >59)

## 2024-04-16 LAB — TSH: TSH: 0.709 u[IU]/mL (ref 0.450–4.500)

## 2024-04-16 LAB — CK: Total CK: 215 U/L (ref 41–331)

## 2024-04-16 LAB — CBC WITH DIFFERENTIAL/PLATELET
Basophils Absolute: 0.1 x10E3/uL (ref 0.0–0.2)
Basos: 1 %
EOS (ABSOLUTE): 0.2 x10E3/uL (ref 0.0–0.4)
Eos: 2 %
Hematocrit: 39.6 % (ref 37.5–51.0)
Hemoglobin: 13.2 g/dL (ref 13.0–17.7)
Immature Grans (Abs): 0.2 x10E3/uL — ABNORMAL HIGH (ref 0.0–0.1)
Immature Granulocytes: 2 %
Lymphocytes Absolute: 2.3 x10E3/uL (ref 0.7–3.1)
Lymphs: 23 %
MCH: 33.8 pg — ABNORMAL HIGH (ref 26.6–33.0)
MCHC: 33.3 g/dL (ref 31.5–35.7)
MCV: 102 fL — ABNORMAL HIGH (ref 79–97)
Monocytes Absolute: 1 x10E3/uL — ABNORMAL HIGH (ref 0.1–0.9)
Monocytes: 10 %
Neutrophils Absolute: 6.5 x10E3/uL (ref 1.4–7.0)
Neutrophils: 62 %
Platelets: 322 x10E3/uL (ref 150–450)
RBC: 3.9 x10E6/uL — ABNORMAL LOW (ref 4.14–5.80)
RDW: 13.4 % (ref 11.6–15.4)
WBC: 10.2 x10E3/uL (ref 3.4–10.8)

## 2024-04-16 LAB — VITAMIN D 25 HYDROXY (VIT D DEFICIENCY, FRACTURES): Vit D, 25-Hydroxy: 42.9 ng/mL (ref 30.0–100.0)

## 2024-04-16 LAB — HGB A1C W/O EAG: Hgb A1c MFr Bld: 6.1 % — ABNORMAL HIGH (ref 4.8–5.6)

## 2024-04-22 DIAGNOSIS — E119 Type 2 diabetes mellitus without complications: Secondary | ICD-10-CM | POA: Diagnosis not present

## 2024-04-22 DIAGNOSIS — H2513 Age-related nuclear cataract, bilateral: Secondary | ICD-10-CM | POA: Diagnosis not present

## 2024-04-22 DIAGNOSIS — H401132 Primary open-angle glaucoma, bilateral, moderate stage: Secondary | ICD-10-CM | POA: Diagnosis not present

## 2024-04-25 DIAGNOSIS — H401131 Primary open-angle glaucoma, bilateral, mild stage: Secondary | ICD-10-CM | POA: Diagnosis not present

## 2024-05-02 DIAGNOSIS — I10 Essential (primary) hypertension: Secondary | ICD-10-CM | POA: Diagnosis not present

## 2024-05-02 DIAGNOSIS — E7800 Pure hypercholesterolemia, unspecified: Secondary | ICD-10-CM | POA: Diagnosis not present

## 2024-05-02 DIAGNOSIS — I25810 Atherosclerosis of coronary artery bypass graft(s) without angina pectoris: Secondary | ICD-10-CM | POA: Diagnosis not present

## 2024-05-02 DIAGNOSIS — G720 Drug-induced myopathy: Secondary | ICD-10-CM | POA: Diagnosis not present

## 2024-05-02 DIAGNOSIS — E1169 Type 2 diabetes mellitus with other specified complication: Secondary | ICD-10-CM | POA: Diagnosis not present

## 2024-05-16 DIAGNOSIS — K080 Exfoliation of teeth due to systemic causes: Secondary | ICD-10-CM | POA: Diagnosis not present

## 2024-05-22 DIAGNOSIS — H903 Sensorineural hearing loss, bilateral: Secondary | ICD-10-CM | POA: Diagnosis not present

## 2024-05-22 DIAGNOSIS — H9311 Tinnitus, right ear: Secondary | ICD-10-CM | POA: Diagnosis not present

## 2024-05-28 DIAGNOSIS — K080 Exfoliation of teeth due to systemic causes: Secondary | ICD-10-CM | POA: Diagnosis not present

## 2024-05-28 DIAGNOSIS — H90A22 Sensorineural hearing loss, unilateral, left ear, with restricted hearing on the contralateral side: Secondary | ICD-10-CM | POA: Diagnosis not present

## 2024-06-06 DIAGNOSIS — H905 Unspecified sensorineural hearing loss: Secondary | ICD-10-CM | POA: Diagnosis not present

## 2024-06-06 DIAGNOSIS — I6782 Cerebral ischemia: Secondary | ICD-10-CM | POA: Diagnosis not present

## 2024-06-06 DIAGNOSIS — Z8673 Personal history of transient ischemic attack (TIA), and cerebral infarction without residual deficits: Secondary | ICD-10-CM | POA: Diagnosis not present

## 2024-06-13 ENCOUNTER — Other Ambulatory Visit: Payer: Self-pay | Admitting: Internal Medicine

## 2024-06-13 DIAGNOSIS — H401131 Primary open-angle glaucoma, bilateral, mild stage: Secondary | ICD-10-CM | POA: Diagnosis not present

## 2024-06-14 ENCOUNTER — Telehealth: Payer: Self-pay | Admitting: Pharmacy Technician

## 2024-06-14 ENCOUNTER — Other Ambulatory Visit (HOSPITAL_COMMUNITY): Payer: Self-pay

## 2024-06-14 NOTE — Telephone Encounter (Signed)
   Pharmacy Patient Advocate Encounter   Received notification from Onbase that prior authorization for repatha  is required/requested.   Insurance verification completed.   The patient is insured through [facility] .   Per test claim: PA required; PA submitted to above mentioned insurance via Latent Key/confirmation #/EOC [id number] Status is pending

## 2024-06-14 NOTE — Telephone Encounter (Signed)
 Pharmacy Patient Advocate Encounter  Received notification from [facility] that Prior Authorization for repatha  has been APPROVED from 06/14/24 to 06/14/25   PA #/Case ID/Reference #: [id number]

## 2024-06-19 DIAGNOSIS — H90A31 Mixed conductive and sensorineural hearing loss, unilateral, right ear with restricted hearing on the contralateral side: Secondary | ICD-10-CM | POA: Diagnosis not present

## 2024-06-26 ENCOUNTER — Telehealth (HOSPITAL_BASED_OUTPATIENT_CLINIC_OR_DEPARTMENT_OTHER): Payer: Self-pay | Admitting: *Deleted

## 2024-06-26 NOTE — Telephone Encounter (Signed)
 Pt has been scheduled in office preop appt 07/03/24 with Lum Louis, NP. Pt has been given new address for [employer].

## 2024-06-26 NOTE — Telephone Encounter (Signed)
 Primary Cardiologist:Mahesh A Chandrasekhar, MD  Chart reviewed as part of pre-operative protocol coverage. Because of [name]'s past medical history and time since last visit, he/she will require a follow-up visit in order to better assess preoperative cardiovascular risk.  Pre-op covering staff: - Please schedule appointment and call patient to inform them (Last seen 04/2023) - Please contact requesting surgeon's office via preferred method (i.e, phone, fax) to inform them of need for appointment prior to surgery.  Request to hold aspirin  can be addressed at office visit.   Rodney EMERSON Bane, NP-C  06/26/2024, 12:21 PM [address], [address] [place], [place] [address] Office [phone] Fax [phone]

## 2024-06-26 NOTE — Telephone Encounter (Signed)
 New fax received with needed information:   PROCEDURE: STAPEDOTOMY  ANESTHESIA: GENERAL

## 2024-06-26 NOTE — Telephone Encounter (Signed)
   Pre-operative Risk Assessment    Patient Name: [name]  DOB: 05/29/1956 MRN: [id number]   Date of last office visit: 04/28/23 JACKEE ALBERTS, NP Date of next office visit: NONE   Request for Surgical Clearance    Procedure:  LEFT VERBAL MESSAGE TO CALL BACK WITH PROCEDURE INFORMATION  Date of Surgery:  Clearance TBD                                Surgeon:  DR. GARRETT CASALE Surgeon's Group or Practice Name:  [facility] Phone number:  [phone] Fax number:  [phone]   Type of Clearance Requested:   - Medical  - Pharmacy:  Hold Aspirin      Type of Anesthesia:  Not Indicated LEFT VERBAL MESSAGE TO CALL BACK WITH ANESTHESIA BEING USED   Additional requests/questions:    Bonney Niels Jest   06/26/2024, 10:11 AM

## 2024-07-02 ENCOUNTER — Encounter: Payer: Self-pay | Admitting: *Deleted

## 2024-07-03 ENCOUNTER — Ambulatory Visit: Attending: Emergency Medicine | Admitting: Emergency Medicine

## 2024-07-03 ENCOUNTER — Encounter: Payer: Self-pay | Admitting: Emergency Medicine

## 2024-07-03 VITALS — BP 127/74 | HR 66 | Ht 70.0 in | Wt 199.9 lb

## 2024-07-03 DIAGNOSIS — I2510 Atherosclerotic heart disease of native coronary artery without angina pectoris: Secondary | ICD-10-CM | POA: Diagnosis not present

## 2024-07-03 DIAGNOSIS — E782 Mixed hyperlipidemia: Secondary | ICD-10-CM

## 2024-07-03 DIAGNOSIS — I639 Cerebral infarction, unspecified: Secondary | ICD-10-CM

## 2024-07-03 DIAGNOSIS — E119 Type 2 diabetes mellitus without complications: Secondary | ICD-10-CM

## 2024-07-03 DIAGNOSIS — Z01810 Encounter for preprocedural cardiovascular examination: Secondary | ICD-10-CM

## 2024-07-03 DIAGNOSIS — I350 Nonrheumatic aortic (valve) stenosis: Secondary | ICD-10-CM | POA: Diagnosis not present

## 2024-07-03 DIAGNOSIS — I10 Essential (primary) hypertension: Secondary | ICD-10-CM

## 2024-07-03 DIAGNOSIS — E785 Hyperlipidemia, unspecified: Secondary | ICD-10-CM | POA: Diagnosis not present

## 2024-07-03 LAB — LIPID PANEL

## 2024-07-03 NOTE — Progress Notes (Signed)
 Cardiology Office Note:    Date:  07/03/2024  ID:  [name], DOB 1956-08-31, MRN [id number] PCP: [name], MD  [facility] Providers Cardiologist:  Stanly DELENA Leavens, MD Cardiology APP:  Rana Lum CROME, NP       Patient Profile:       Chief Complaint: 1 year follow-up and preoperative clearance History of Present Illness:  [name] is a 68 y.o. male with visit-pertinent history of coronary artery disease s/p NSTEMI on 12/2021 PCI/DES to ostial RCA, inflammatory myopathy, hypertension, hyperlipidemia, T2DM, stage I prostate CA, transaminitis  He was initially seen in 12/2021 with complaints of chest pain and was found to have an NSTEMI.  He underwent cardiac catheterization and was found to have 80% ostial RCA with PCI/DES x 1.  Echocardiogram was completed showing EF of 45-50% with global hypokinesis and grade 1 DD with mild to moderate AS.  Repeat echocardiogram 01/2022 showed recovered EF of 60 to 65% with normal LV function and grade 1 DD with mild aortic stenosis with mean gradient 11 mmHg.  He is on PCSK9 due to transaminitis on statins.  He was last seen in clinic on 04/28/2023.  He was doing well with no acute cardiac concerns or complaints.  He was staying active working without exertional symptoms.  He was given the okay to stop Plavix  given he has completed greater than 1 year of DAPT.  He was to follow-up in 6 months.  He is now pending STAPEDOTOMY under general anesthesia on date TBD with [place] ear nose and throat   Discussed the use of AI scribe software for clinical note transcription with the patient, who gave verbal consent to proceed.  History of Present Illness [name] is a 68 year old male with coronary artery disease and diabetes who presents for cardiac clearance for middle ear surgery.  Today he is doing well without acute cardiovascular concerns.  He currently denies chest pain or prior anginal equivalent.  He  remains active without exertional symptoms.  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 two to three times a week. He avoids heavy lifting and sports due to imbalance and a history of falls. No chest pain, dyspnea, dizziness, syncope/presyncope, orthopnea, PND or lightheadedness are present.  He takes aspirin  daily and is on evolocumab  every two weeks for cholesterol management, with a last known cholesterol level of 84 mg/dL. Diabetes is managed with metformin  and insulin .   Review of systems:  Please see the history of present illness. All other systems are reviewed and otherwise negative.      Studies Reviewed:    EKG Interpretation Date/Time:  Wednesday July 03 2024 08:34:49 EDT Ventricular Rate:  70 PR Interval:  164 QRS Duration:  116 QT Interval:  404 QTC Calculation: 436 R Axis:   -28  Text Interpretation: Normal sinus rhythm Left ventricular hypertrophy with QRS widening ( R in aVL , Cornell product ) When compared with ECG of 28-Apr-2023 08:03, ST no longer depressed in Inferior leads T wave inversion no longer evident in Inferior leads Confirmed by Rana Lum [id number]) on 07/03/2024 9:07:38 AM    Carotid duplex 02/04/2022 Right Carotid: Velocities in the right ICA are consistent with a 1-39%  stenosis.   Left Carotid: Velocities in the left ICA are consistent with a 1-39%  stenosis.   Vertebrals: Bilateral vertebral arteries  demonstrate antegrade flow.   Cardiac catheterization 12/28/2021   Ost RCA lesion is 80% stenosed.   A drug-eluting stent was successfully placed using a STENT ONYX FRONTIER [id number], postdilated to 4.5 mm in diameter and optimized with intravascular ultrasound.   Post intervention, there is a 0% residual stenosis.   The left ventricular systolic function is normal.   LV end diastolic pressure is normal.   The left ventricular ejection  fraction is 50-55% by visual estimate.   There is no aortic valve stenosis. Diagnostic Dominance: Right  Intervention    Successful PCI of the ostial RCA which was calcified.  It was a short focal area of calcification.  The distal reference was over 4 mm.  Proximal and ostial stent postdilated aggressively with a 4.5 Converse balloon.  I stressed the importance of dual antiplatelet therapy and aggressive secondary prevention.  Echocardiogram 12/28/2021  1. Left ventricular ejection fraction, by estimation, is 45 to 50%. The  left ventricle has mildly decreased function. The left ventricle  demonstrates global hypokinesis. Left ventricular diastolic parameters are  consistent with Grade I diastolic  dysfunction (impaired relaxation).   2. Right ventricular systolic function is normal. The right ventricular  size is normal.   3. Left atrial size was moderately dilated.   4. The mitral valve is normal in structure. No evidence of mitral valve  regurgitation. No evidence of mitral stenosis.   5. The aortic valve is tricuspid. There is moderate calcification of the  aortic valve. There is mild thickening of the aortic valve. Aortic valve  regurgitation is not visualized. Mild to moderate aortic valve stenosis.   Risk Assessment/Calculations:              Physical Exam:   VS:  BP 127/74 (BP Location: Right Arm, Patient Position: Sitting, Cuff Size: Normal)   Pulse 66   Ht 5' 10 (1.778 m)   Wt 199 lb 14.1 oz (90.7 kg)   SpO2 94%   BMI 28.68 kg/m    Wt Readings from Last 3 Encounters:  07/03/24 199 lb 14.1 oz (90.7 kg)  04/15/24 201 lb (91.2 kg)  10/10/23 207 lb (93.9 kg)    GEN: Well nourished, well developed in no acute distress NECK: No JVD; No carotid bruits CARDIAC: RRR.  2/6 RUSB murmur.  No rubs, gallops RESPIRATORY:  Clear to auscultation without rales, wheezing or rhonchi  ABDOMEN: Soft, non-tender, non-distended EXTREMITIES:  No edema; No acute deformity       Assessment and Plan:  Coronary artery disease S/p NSTEMI 12/2021 with PCI/DES to ostial RCA - EKG today without acute ischemic changes - Today he remains stable without chest pains.  Remains active without any exertional symptoms.  Denies prior anginal equivalent.  No indication further ischemic evaluation at this time - Continue aspirin  81 mg daily - Continue amlodipine  10 mg daily, lisinopril  40 mg daily, and carvedilol  12.5 mg twice daily - Continue Repatha  140 mg q. 14 days  Hypertension Blood pressure today well-controlled at 127/74 - Continue amlodipine  10 mg daily - Continue carvedilol  12.5 mg twice daily - Continue lisinopril  40 mg daily  Hyperlipidemia, LDL goal <55 LDL 40 on 01/2024 well-controlled - Will update fasting lipid panel today - Continue Repatha  140 mg q. 14 days  T2DM A1c 6.1% on 03/2024 and well-controlled - Managed on Lantus  and Humalog as well as metformin   Mild-moderate aortic stenosis Echocardiogram 12/2021 with mild to moderate aortic valve stenosis - Today he remains asymptomatic without chest pains, dyspnea,  or syncope  - Plan for repeat echocardiogram for routine monitoring  History of CVA History of mild residual left upper and lower extremity weakness, hyperreflexia on left side - Management per neurology - Continue aspirin  81 mg daily  Preoperative cardiovascular clearance According to the Revised Cardiac Risk Index (RCRI), his Perioperative Risk of Major Cardiac Event is (%): 0.9. His Functional Capacity in METs is: 5.62 according to the Duke Activity Status Index (DASI).  Therefore, based on ACC/AHA guidelines, patient would be at acceptable risk for the planned procedure without further cardiovascular testing. I will route this recommendation to the requesting party via Epic fax function.  He may hold aspirin  for 5-7 days prior to procedure. Please resume aspirin  as soon as possible postprocedure, at the discretion of the surgeon.  He        Dispo:  Return in about 1 year (around 07/03/2025).  Signed, Lum LITTIE Louis, NP

## 2024-07-03 NOTE — Patient Instructions (Addendum)
 Medication Instructions:   Your physician recommends that you continue on your current medications as directed. Please refer to the Current Medication list given to you today.  Lab Work: TODAY:  LIPID & CMET  Testing/Procedures: Your physician has requested that you have an echocardiogram. Echocardiography is a painless test that uses sound waves to create images of your heart. It provides your doctor with information about the size and shape of your heart and how well your heart's chambers and valves are working. This procedure takes approximately one hour. There are no restrictions for this procedure. Please do NOT wear cologne, perfume, aftershave, or lotions (deodorant is allowed). Please arrive 15 minutes prior to your appointment time.  Please note: We ask at that you not bring children with you during ultrasound (echo/ vascular) testing. Due to room size and safety concerns, children are not allowed in the ultrasound rooms during exams. Our front office staff cannot provide observation of children in our lobby area while testing is being conducted. An adult accompanying a patient to their appointment will only be allowed in the ultrasound room at the discretion of the ultrasound technician under special circumstances. We apologize for any inconvenience.   Follow-Up: At [facility], you and your health needs are our priority.  As part of our continuing mission to provide you with exceptional heart care, our providers are all part of one team.  This team includes your primary Cardiologist (physician) and Advanced Practice Providers or APPs (Physician Assistants and Nurse Practitioners) who all work together to provide you with the care you need, when you need it.  Your next appointment:   1 YEAR   Provider:   Stanly DELENA Leavens, MD OR Lum Louis, NP     Other Instructions:

## 2024-07-04 ENCOUNTER — Ambulatory Visit: Payer: Self-pay | Admitting: Emergency Medicine

## 2024-07-04 LAB — COMPREHENSIVE METABOLIC PANEL WITH GFR
ALT: 24 IU/L (ref 0–44)
AST: 22 IU/L (ref 0–40)
Albumin: 4.3 g/dL (ref 3.9–4.9)
Alkaline Phosphatase: 76 IU/L (ref 47–123)
BUN/Creatinine Ratio: 24 (ref 10–24)
BUN: 19 mg/dL (ref 8–27)
Bilirubin Total: 0.6 mg/dL (ref 0.0–1.2)
CO2: 23 mmol/L (ref 20–29)
Calcium: 9.6 mg/dL (ref 8.6–10.2)
Chloride: 104 mmol/L (ref 96–106)
Creatinine, Ser: 0.78 mg/dL (ref 0.76–1.27)
Globulin, Total: 2 g/dL (ref 1.5–4.5)
Glucose: 73 mg/dL (ref 70–99)
Potassium: 4.2 mmol/L (ref 3.5–5.2)
Sodium: 142 mmol/L (ref 134–144)
Total Protein: 6.3 g/dL (ref 6.0–8.5)
eGFR: 98 mL/min/1.73 (ref >59)

## 2024-07-04 LAB — LIPID PANEL
Cholesterol, Total: 88 mg/dL — AB (ref 100–199)
HDL: 33 mg/dL — AB (ref >39)
LDL CALC COMMENT:: 2.7 ratio (ref 0.0–5.0)
LDL Chol Calc (NIH): 29 mg/dL (ref 0–99)
Triglycerides: 151 mg/dL — ABNORMAL HIGH (ref 0–149)
VLDL Cholesterol Cal: 26 mg/dL (ref 5–40)

## 2024-08-14 ENCOUNTER — Ambulatory Visit (HOSPITAL_COMMUNITY)
Admission: RE | Admit: 2024-08-14 | Discharge: 2024-08-14 | Disposition: A | Discharge status: Home / Self Care | Source: Ambulatory Visit | Attending: Cardiology | Admitting: Cardiology

## 2024-08-14 DIAGNOSIS — E782 Mixed hyperlipidemia: Secondary | ICD-10-CM | POA: Insufficient documentation

## 2024-08-14 DIAGNOSIS — I2510 Atherosclerotic heart disease of native coronary artery without angina pectoris: Secondary | ICD-10-CM | POA: Insufficient documentation

## 2024-08-14 DIAGNOSIS — I350 Nonrheumatic aortic (valve) stenosis: Secondary | ICD-10-CM | POA: Insufficient documentation

## 2024-08-14 LAB — ECHOCARDIOGRAM COMPLETE
AR max vel: 1.19 cm2
AV Area VTI: 1.2 cm2
AV Area mean vel: 1.14 cm2
AV Mean grad: 27 mmHg
AV Peak grad: 49 mmHg
Ao pk vel: 3.5 m/s
Area-P 1/2: 3.31 cm2
P 1/2 time: 400 ms
S' Lateral: 3.33 cm

## 2024-08-29 DIAGNOSIS — I25810 Atherosclerosis of coronary artery bypass graft(s) without angina pectoris: Secondary | ICD-10-CM | POA: Diagnosis not present

## 2024-08-29 DIAGNOSIS — I10 Essential (primary) hypertension: Secondary | ICD-10-CM | POA: Diagnosis not present

## 2024-08-29 DIAGNOSIS — E1169 Type 2 diabetes mellitus with other specified complication: Secondary | ICD-10-CM | POA: Diagnosis not present

## 2024-08-29 DIAGNOSIS — G720 Drug-induced myopathy: Secondary | ICD-10-CM | POA: Diagnosis not present

## 2024-08-29 DIAGNOSIS — E7800 Pure hypercholesterolemia, unspecified: Secondary | ICD-10-CM | POA: Diagnosis not present

## 2024-09-04 ENCOUNTER — Other Ambulatory Visit: Payer: Self-pay | Admitting: Internal Medicine

## 2024-09-04 DIAGNOSIS — E782 Mixed hyperlipidemia: Secondary | ICD-10-CM

## 2024-09-04 DIAGNOSIS — I2510 Atherosclerotic heart disease of native coronary artery without angina pectoris: Secondary | ICD-10-CM

## 2024-09-04 DIAGNOSIS — I350 Nonrheumatic aortic (valve) stenosis: Secondary | ICD-10-CM

## 2024-09-11 DIAGNOSIS — H90A31 Mixed conductive and sensorineural hearing loss, unilateral, right ear with restricted hearing on the contralateral side: Secondary | ICD-10-CM | POA: Diagnosis not present

## 2024-09-11 DIAGNOSIS — H9311 Tinnitus, right ear: Secondary | ICD-10-CM | POA: Diagnosis not present

## 2024-10-22 NOTE — Progress Notes (Unsigned)
 "  No chief complaint on file.     ASSESSMENT AND PLAN  [name] is a 69 y.o. male   Inflammatory myopathy  Currently doing well on methotrexate , mild proximal muscle weakness and occasional imbalance but overall stable without progression  Prednisone  gradually tapered, CPK normalized, last check 215 03/2024  Continue methotrexate  2.5 mg 6 tablets weekly   Will follow up with [name] regarding need of repeat EMG/NCV  Responded well to prednisone  treatment, started as 80 mg since May 2023, with significant side effect, poorly controlled diabetes, weight gain  Not currently receiving IVIG due to insurance coverage issue  Initially, significantly elevated CPK at 2,314 in 02/2022, diagnosis was made shortly after his heart attack and stroke in 2023, at his worst, he has gait abnormality, intermittent mild swallowing difficulty  Muscle biopsy of left vastus lateralis muscle on April 19, 2022 showed no clear necrosis, inflammation, phagocytosis, there were rare regenerating fibers, type II fiber predominant, with evidence of atrophic fibers  EMG/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/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 or call earlier if needed  Addendum: Chart reviewed, CPK has been normalized with current combination of methotrexate , tapering dose of prednisone , will continue to taper down prednisone  from 7.5 to 5 mg daily, for 1 month, then 2.5  mg daily, until his next follow-up in July 2025   DIAGNOSTIC DATA (LABS, IMAGING, TESTING) - I reviewed patient records, labs, notes, testing and imaging myself where available. Addendum, patient was seen by [name] on April 01, 2022, pathology reported no clear necrosis, inflammation, phagocytosis, there were rare regenerating fibers, type II fiber predominant, with evidence of atrophic fibers     MEDICAL HISTORY:   Update 10/23/2024 [name]: Patient returns for 35-month follow-up.  After prior visit, prednisone  discontinued.  Repeat CK 215.         UPDATE April 15 2024 [name]: He was followed up by nurse practitioner last for visit, overall doing better, on tapering dose of prednisone , has been on 2.5 mg for 6 months, no recurrent weakness, also on methotrexate  2.5 mg 6 tablets weekly   He denies bulbar weakness, but complains of fatigue, he works a health and safety inspector job, walking about 7000 steps daily, swims regularly   Update 10/09/2022 [name]: Returns for follow-up visit.  After prior visit, repeat CK level normal at 210, recommended gradually reducing prednisone  to 5 mg daily and continue on methotrexate .   Reports overall doing well since prior visit.  Continued mild imbalance with good days and bad days, rare/occasional use of cane as needed, denies any recent falls.  Mild proximal leg weakness without any changes since prior visit.  Reports currently taking prednisone  7.5 mg daily (never decreased to 5 mg dosage), he questions further reducing due to weight gain and difficulty losing weight.  Continues on methotrexate , tolerating well.  Reports lab work by PCP in December which was all satisfactory (unable to view via epic), has follow-up visit in 2 weeks.  No new stroke/TIA symptoms.  UPDATE 04/03/2023 [name]:  Reports he had a fall back in March after tripping on object in garage resulting in a right proximal humerus fracture. Did not require surgery, recently completed PT and reports  healing well.  He has routinely followed with orthopedics.  Denies any recurrent falls since that time.  Feels overall he has been doing well since prior visit.  Does still have intermittent imbalance which is not new and will use a cane as needed.  Mild proximal leg weakness although feels improving. Prednisone  dosage decreased after prior visit as CPK level within normal limits, currently taking 10 mg daily but concern of weight gain and difficulty with weight loss. Questions further reducing dosage.  Continues on methotrexate .  No longer receiving IVIG due to insurance coverage issue.  Denies any progression of symptoms.  UPDATE Oct 04 2022 [name]: He run out of prednisone  20mg   2 weeks prior to visit, stop it abruptly, he noticed mily change, balance is off, dizzy, light headed,  knees are weaker, arms are strong, no bulbar weakness,   UPDATE August 28th 2023 [name]: He is with his son at today's clinical visit, left breast symptomatic for muscle biopsy on April 19, 2022, showed minimum abnormal muscles suggestive of necrosis without clear inflammation, some lymphocyte as seen surrounding muscle fibers,  He received IVIG at the beginning of August 2023, tolerating it well, maintenance 1 g/kg is scheduled for August 29/30,  Methotrexate  2.5 mg 6 tablets every Monday, along with folic acid , tolerating it well  Prednisone  has been decreased to 20 mg daily since August 2023, no longer have significant side effect  He reported 75% back to baseline, ambulating without cane, still mild unsteady gait, need push-up to get up from seated position, better range of motion of upper extremity, water  therapy has been helpful   Consult visit 03/07/2022 [name]: [name] is a 69 year old male, seen in request by his primary care physician [name], to follow-up for stroke, inflammatory myopathy, initial evaluation was with her daughter on March 07, 2022,  I reviewed and summarized the  referring note. PMHx. HTN CAD, April 2023, was on brilinta  DM since 2017  He has been physically active, suffered heart attack in April 2023 while pushing mowing his lawn, was noted to have elevated troponin, underwent stent, double antiplatelet treatment aspirin  and Brilinta   During that hospital admission, he was found to have elevated abnormal liver functional test, progressive weakness since the end of 2022, his atorvastatin  20 mg was on hold since then  Hospital admission for stroke on Feb 02, 2022 for weakness, to the point his father has to help him to get into the car, laboratory evaluation showed CPK 7291, AST 181, ALT  CT of the chest abdomen and pelvis with contrast negative for evidence of metastatic disease, extensive coronary artery calcification  He was seen by neurologist, with his presentation of symmetric bilateral upper and lower extremity weakness, consistent with inflammatory myositis  MRI of the brain showed acute infarction involving ventral pontine  CT angiogram of head and neck showed right ICA stenosis measuring at least 70% proximally, 70% at the paraclinoid segment, 50% left internal carotid artery stenosis, bilateral vertebral origin stenosis measuring 50% on the right 40% on the left  Laboratory evaluations in June 2023, CPK 991, CBC showed elevated WBC 21.7 hemoglobin 13.7, BMP creatinine 0.5,  HMGCR ig antibody was significantly elevated 97 units, which may be associated with necrotizing autoimmune  myopathy in a subset of statin treated patients, although infrequent, this antibody may also be observed in statin nave patient with necrotizing autoimmune myopathy,  Normal magnesium, phosphate, elevated ALT 262, AST 65, A1c 7.4, LDL was 97.8, normal TSH, negative HIV, negative D-dimer, lipase,  MRI of cervical spine showed multilevel degenerative changes, no significant canal stenosis, C3-4 severe left, C5-6 severe right, C6-7 severe left and moderate right  neuroforaminal stenosis  He was treated with Solu-Medrol  1 g daily for 3 days followed by p.o. prednisone  tapering starting from 80 mg daily, decrement of 10 mg every 1 week, will be taking 50 mg March 08, 2022  Case was discussed with Dr. Burnard Banter of general surgeon, planning on having muscle biopsy, he was discharged to rehabilitation, follow-up visit did showed improvement of his muscle weakness, was able to ambulate, the response to steroid also favored the diagnosis of inflammatory myopathy, he was discharged to inpatient rehabilitation, discharged on Feb 16, 2022, he now lives alone at home, ambulated with a cane  Emergency presentation 3 days later Feb 19, 2022, fell, hit his left frontal region on the back,  CT head showed no acute abnormality  He was not on any antiplatelet agent prior to hospital admission, now taking aspirin  and Plavix , he has some improvement, but far from his baseline, has difficulty raising arm overhead, could not put plate in the cabinet, gait abnormality,        PHYSICAL EXAM:   There were no vitals filed for this visit.  There is no height or weight on file to calculate BMI.  PHYSICAL EXAMNIATION:  Gen: NAD, very pleasant middle-age Caucasian male, conversant, well nourised, well groomed                     Cardiovascular: Regular rate rhythm, no peripheral edema, warm, nontender. Eyes: Conjunctivae clear without exudates or hemorrhage Neck: Supple, no carotid bruits. Pulmonary: Clear to auscultation bilaterally   NEUROLOGICAL EXAM:  MENTAL STATUS: Speech/cognition: Awake, alert, oriented to history taking and casual conversation CRANIAL NERVES: CN II: Visual fields are full to confrontation. Pupils are round equal and briskly reactive to light. CN III, IV, VI: extraocular movement are normal. No ptosis. CN V: Facial sensation is intact to light touch CN VII: Face is symmetric with normal eye closure  CN VIII: Hearing is normal to causal  conversation. CN IX, X: Phonation is normal. CN XI: Head turning and shoulder shrug are intact  MOTOR: Limited range of motion of right shoulder due to prior rotator cuff injury. Mild L>R hip flexor weakness  REFLEXES: Reflexes are mildly hyperreflexia on left upper extremity, patella, bilateral Babinski sign   SENSORY: Intact to light touch, pinprick and vibratory sensation are intact in fingers and toes.  COORDINATION: There is no trunk or limb dysmetria noted.  GAIT/STANCE: Can get up from seated position with arms crossed, wide-based, cautious gait, difficulty performing tandem walk and heel toe.  Ambulating without AD  REVIEW OF SYSTEMS:  Full 14 system review of systems performed and notable only for as above All other review of systems were negative.   ALLERGIES: Allergies  Allergen Reactions   Atorvastatin  Calcium  Other (See Comments)    Statin induced myopathy and transaminitis    HOME MEDICATIONS: Current Outpatient Medications  Medication Sig Dispense Refill   acetaminophen  (TYLENOL ) 325 MG tablet Take 1-2 tablets (325-650 mg total) by mouth every 4 (four) hours as needed for mild pain.     amLODipine  (NORVASC ) 10  MG tablet Take 1 tablet (10 mg total) by mouth every morning. 30 tablet 0   aspirin  81 MG EC tablet Take 1 tablet (81 mg total) by mouth daily. Swallow whole. 90 tablet 3   carvedilol  (COREG ) 12.5 MG tablet Take 1 tablet (12.5 mg total) by mouth 2 (two) times daily with a meal. (Patient taking differently: Take 12.5 mg by mouth 2 (two) times daily with a meal. Takes 1 1/2 pill BID) 180 tablet 1   cholecalciferol  (VITAMIN D ) 1000 units tablet Take 1 tablet (1,000 Units total) by mouth daily. 30 tablet 0   citalopram  (CELEXA ) 20 MG tablet Take 1 tablet (20 mg total) by mouth daily. 30 tablet 0   Cyanocobalamin  (VITAMIN B-12) 2500 MCG SUBL Place 1 tablet (2,500 mcg total) under the tongue daily. 30 tablet 0   cyclobenzaprine  (FLEXERIL ) 10 MG tablet Take 1  tablet (10 mg total) by mouth at bedtime. 30 tablet 0   diclofenac  Sodium (VOLTAREN ) 1 % GEL Apply 2 g topically 4 (four) times daily. (Patient taking differently: Apply 2 g topically 3 (three) times daily as needed (pain).) 100 g 0   folic acid  (FOLVITE ) 1 MG tablet Take 1 mg by mouth daily.     insulin  glargine (LANTUS ) 100 UNIT/ML Solostar Pen Inject 12 Units into the skin 2 (two) times daily. (Patient taking differently: Inject 20 Units into the skin 2 (two) times daily.) 15 mL 11   insulin  lispro (HUMALOG) 100 UNIT/ML KwikPen Inject 5 Units into the skin every evening.     lisinopril  (ZESTRIL ) 40 MG tablet Take 1 tablet (40 mg total) by mouth every morning. 30 tablet 0   metFORMIN  (GLUCOPHAGE ) 500 MG tablet Take 2 tablets (1,000 mg total) by mouth 2 (two) times daily with a meal. 60 tablet 0   methotrexate  (RHEUMATREX) 2.5 MG tablet Take 6 tablets (15 mg total) by mouth once a week. 30 tablet 11   Multiple Vitamins-Minerals (MULTIVITAMINS THER. W/MINERALS) TABS Take 1 tablet by mouth daily.     nitroGLYCERIN  (NITROSTAT ) 0.4 MG SL tablet Place 1 tablet (0.4 mg total) under the tongue every 5 (five) minutes x 3 doses as needed for chest pain. 25 tablet 3   Omega-3 Fatty Acids (FISH OIL) 1200 MG CAPS Take 1,200 mg by mouth in the morning and at bedtime.     predniSONE  (DELTASONE ) 2.5 MG tablet Take 1 tablet (2.5 mg total) by mouth daily. 30 tablet 11   REPATHA  SURECLICK 140 MG/ML SOAJ INJECT 140 MG SUBCUTANEOUSLY EVERY 14 DAYS 6 mL 0   senna-docusate (SENOKOT-S) 8.6-50 MG tablet Take 1 tablet by mouth daily. 30 tablet 0   No current facility-administered medications for this visit.    PAST MEDICAL HISTORY: Past Medical History:  Diagnosis Date   Aortic stenosis    mild AS 02/04/22   Arthritis    Diabetes mellitus    fasting 120s   History of kidney stones    Hypertension    Myocardial infarction (HCC)    stent placed   Peripheral neuropathy    left fifth finger, left toe   Pneumonia     hx of    PAST SURGICAL HISTORY: Past Surgical History:  Procedure Laterality Date   APPENDECTOMY     CORONARY STENT INTERVENTION N/A 12/28/2021   Procedure: CORONARY STENT INTERVENTION;  Surgeon: Dann Candyce RAMAN, MD;  Location: [facility];  Service: Cardiovascular;  Laterality: N/A;   CORONARY ULTRASOUND/IVUS N/A 12/28/2021   Procedure: Intravascular Ultrasound/IVUS;  Surgeon: Dann Candyce RAMAN, MD;  Location: [facility] INVASIVE CV LAB;  Service: Cardiovascular;  Laterality: N/A;   CYSTOSCOPY W/ URETERAL STENT PLACEMENT Bilateral 03/17/2014   Procedure: CYSTOSCOPY WITH RIGHT STENT PULL  ;  Surgeon: Ricardo Likens, MD;  Location: [facility];  Service: Urology;  Laterality: Bilateral;   CYSTOSCOPY WITH RETROGRADE PYELOGRAM, URETEROSCOPY AND STENT PLACEMENT Bilateral 02/01/2014   Procedure: CYSTOSCOPY WITH BILATERAL RETROGRADE CYSTOSCOPY, BILATERAL RETROGRADE, PYELOGRAM, RIGHT URETEROSCOPY, STONE BASKETRY,  AND BILATERAL STENT PLACEMENT;  Surgeon: Ricardo Likens, MD;  Location: [facility];  Service: Urology;  Laterality: Bilateral;   CYSTOSCOPY WITH URETEROSCOPY AND STENT PLACEMENT Left 02/12/2021   Procedure: CYSTOSCOPY WITH LEFT URETEROSCOPY, BALLOON DILITATION, HOLMIUM LASER LITHOTRIPSY AND STENT PLACEMENT;  Surgeon: Carolee Sherwood JONETTA DOUGLAS, MD;  Location: [facility];  Service: Urology;  Laterality: Left;   HOLMIUM LASER APPLICATION Left 03/19/2014   Procedure: HOLMIUM LASER APPLICATION;  Surgeon: Ricardo Likens, MD;  Location: [facility];  Service: Urology;  Laterality: Left;   LEFT HEART CATH AND CORONARY ANGIOGRAPHY N/A 12/28/2021   Procedure: LEFT HEART CATH AND CORONARY ANGIOGRAPHY;  Surgeon: Dann Candyce RAMAN, MD;  Location: [facility] INVASIVE CV LAB;  Service: Cardiovascular;  Laterality: N/A;   LITHOTRIPSY     MUSCLE BIOPSY Left 04/19/2022   Procedure: LEFT THIGH MUSCLE BIOPSY;  Surgeon: Vanderbilt Ned, MD;  Location: [facility] OR;  Service: General;  Laterality: Left;   NEPHROLITHOTOMY Left 03/17/2014   Procedure:  NEPHROLITHOTOMY PERCUTANEOUS with access;  Surgeon: Ricardo Likens, MD;  Location: [facility];  Service: Urology;  Laterality: Left;   NEPHROLITHOTOMY Left 03/19/2014   Procedure: 2ND STAGE NEPHROLITHOTOMY PERCUTANEOUS left ureteroscopy with lasr stone treatment and stent placement, with access;  Surgeon: Ricardo Likens, MD;  Location: [facility];  Service: Urology;  Laterality: Left;   TOTAL KNEE ARTHROPLASTY Right 09/02/2014   Procedure: RIGHT TOTAL KNEE ARTHROPLASTY;  Surgeon: Cordella Glendia Hutchinson, MD;  [facility] OR;  Service: Orthopedics;  Laterality: Right;   uretral stent      FAMILY HISTORY: Family History  Problem Relation Age of Onset   Alzheimer's disease Mother    Heart attack Father     SOCIAL HISTORY: Social History   Socioeconomic History   Marital status: Widowed    Spouse name: Not on file   Number of children: 2   Years of education: 3 yeae   Highest education level: Not on file  Occupational History   Not on file  Tobacco Use   Smoking status: Never   Smokeless tobacco: Never  Vaping Use   Vaping status: Never Used  Substance and Sexual Activity   Alcohol  use: Yes    Alcohol /week: 1.0 standard drink of alcohol     Types: 1 Cans of beer per week    Comment: occasional/ 1 beers week   Drug use: No   Sexual activity: Yes    Birth control/protection: Other-see comments  Other Topics Concern   Not on file  Social History Narrative   Left handed   Caffeine-none   Works for swimming [employer]   Social Drivers of Health   Tobacco Use: Low Risk (07/03/2024)   Patient History    Smoking Tobacco Use: Never    Smokeless Tobacco Use: Never    Passive Exposure: Not on file  Financial Resource Strain: Not on file  Food Insecurity: Not on file  Transportation Needs: Not on file  Physical Activity: Not on file  Stress: Not on file  Social Connections: Not on file  Intimate Partner Violence:  Not on file  Depression (PHQ2-9): Low Risk (04/08/2022)   Depression  (PHQ2-9)    PHQ-2 Score: 4  Alcohol  Screen: Not on file  Housing: Not on file  Utilities: Not on file  Health Literacy: Not on file      I spent 31 minutes of face-to-face and non-face-to-face time with patient.  This included previsit chart review, lab review, study review, order entry, electronic health record documentation, patient education and discussion regarding above diagnoses, treatment plan and answered all other questions to patient satisfaction  Harlene Bogaert, [facility]  [facility] [address] [place], [place] [address]  Phone [phone] Fax [phone] Note: This document was prepared with digital dictation and possible smart phrase technology. Any transcriptional errors that result from this process are unintentional.  "

## 2024-10-23 ENCOUNTER — Ambulatory Visit: Admitting: Adult Health

## 2024-10-23 ENCOUNTER — Encounter: Payer: Self-pay | Admitting: Adult Health

## 2024-10-23 VITALS — BP 154/65 | HR 59 | Ht 70.0 in | Wt 203.0 lb

## 2024-10-23 DIAGNOSIS — Z79899 Other long term (current) drug therapy: Secondary | ICD-10-CM | POA: Diagnosis not present

## 2024-10-23 DIAGNOSIS — G7249 Other inflammatory and immune myopathies, not elsewhere classified: Secondary | ICD-10-CM

## 2024-10-23 NOTE — Patient Instructions (Addendum)
 Your Plan:  We will repeat lab work today.  Based on these results, we will likely decrease your methotrexate  to 5 tablets weekly and continue this until your follow up visit. I will send you a message via MyChart tomorrow regarding these results and further treatment plan  Please call with any worsening symptoms     Follow-up with [name] in 6 months or call earlier if needed     Thank you for coming to see us  at [facility]. I hope we have been able to provide you high quality care today.  You may receive a patient satisfaction survey over the next few weeks. We would appreciate your feedback and comments so that we may continue to improve ourselves and the health of our patients.

## 2024-10-24 ENCOUNTER — Ambulatory Visit: Payer: Self-pay | Admitting: Adult Health

## 2024-10-24 LAB — COMPREHENSIVE METABOLIC PANEL WITH GFR
ALT: 31 [IU]/L (ref 0–44)
AST: 25 [IU]/L (ref 0–40)
Albumin: 4.5 g/dL (ref 3.9–4.9)
Alkaline Phosphatase: 81 [IU]/L (ref 47–123)
BUN/Creatinine Ratio: 28 — ABNORMAL HIGH (ref 10–24)
BUN: 17 mg/dL (ref 8–27)
Bilirubin Total: 0.3 mg/dL (ref 0.0–1.2)
CO2: 24 mmol/L (ref 20–29)
Calcium: 9.7 mg/dL (ref 8.6–10.2)
Chloride: 103 mmol/L (ref 96–106)
Creatinine, Ser: 0.61 mg/dL — ABNORMAL LOW (ref 0.76–1.27)
Globulin, Total: 2.1 g/dL (ref 1.5–4.5)
Glucose: 162 mg/dL — ABNORMAL HIGH (ref 70–99)
Potassium: 4.5 mmol/L (ref 3.5–5.2)
Sodium: 144 mmol/L (ref 134–144)
Total Protein: 6.6 g/dL (ref 6.0–8.5)
eGFR: 105 mL/min/{1.73_m2}

## 2024-10-24 LAB — CBC WITH DIFFERENTIAL/PLATELET
Basophils Absolute: 0 10*3/uL (ref 0.0–0.2)
Basos: 1 %
EOS (ABSOLUTE): 0.2 10*3/uL (ref 0.0–0.4)
Eos: 2 %
Hematocrit: 40.9 % (ref 37.5–51.0)
Hemoglobin: 13.3 g/dL (ref 13.0–17.7)
Immature Grans (Abs): 0.1 10*3/uL (ref 0.0–0.1)
Immature Granulocytes: 1 %
Lymphocytes Absolute: 2.6 10*3/uL (ref 0.7–3.1)
Lymphs: 31 %
MCH: 31.5 pg (ref 26.6–33.0)
MCHC: 32.5 g/dL (ref 31.5–35.7)
MCV: 97 fL (ref 79–97)
Monocytes Absolute: 0.8 10*3/uL (ref 0.1–0.9)
Monocytes: 10 %
Neutrophils Absolute: 4.5 10*3/uL (ref 1.4–7.0)
Neutrophils: 55 %
Platelets: 313 10*3/uL (ref 150–450)
RBC: 4.22 x10E6/uL (ref 4.14–5.80)
RDW: 14.5 % (ref 11.6–15.4)
WBC: 8.2 10*3/uL (ref 3.4–10.8)

## 2024-10-24 LAB — CK: Total CK: 164 U/L (ref 41–331)

## 2024-10-24 MED ORDER — METHOTREXATE SODIUM 2.5 MG PO TABS: 12.5000 mg | ORAL_TABLET | ORAL | 3 refills | Status: AC

## 2024-10-24 NOTE — Progress Notes (Signed)
 Chart reviewed, agree above plan ?

## 2025-04-22 ENCOUNTER — Ambulatory Visit: Admitting: Neurology
